# Patient Record
Sex: Female | Born: 1945
Health system: Southern US, Community
[De-identification: ages and names within clinical notes are randomized; demographics above are authoritative.]

## PROBLEM LIST (undated history)

## (undated) DIAGNOSIS — T41205A Adverse effect of unspecified general anesthetics, initial encounter: Secondary | ICD-10-CM

## (undated) DIAGNOSIS — Z923 Personal history of irradiation: Secondary | ICD-10-CM

## (undated) DIAGNOSIS — F319 Bipolar disorder, unspecified: Secondary | ICD-10-CM

## (undated) DIAGNOSIS — G245 Blepharospasm: Secondary | ICD-10-CM

## (undated) DIAGNOSIS — I509 Heart failure, unspecified: Secondary | ICD-10-CM

## (undated) DIAGNOSIS — Z9889 Other specified postprocedural states: Secondary | ICD-10-CM

## (undated) DIAGNOSIS — J439 Emphysema, unspecified: Secondary | ICD-10-CM

## (undated) DIAGNOSIS — M1990 Unspecified osteoarthritis, unspecified site: Secondary | ICD-10-CM

## (undated) DIAGNOSIS — M549 Dorsalgia, unspecified: Secondary | ICD-10-CM

## (undated) DIAGNOSIS — J449 Chronic obstructive pulmonary disease, unspecified: Secondary | ICD-10-CM

## (undated) DIAGNOSIS — I255 Ischemic cardiomyopathy: Secondary | ICD-10-CM

## (undated) DIAGNOSIS — L719 Rosacea, unspecified: Secondary | ICD-10-CM

## (undated) DIAGNOSIS — I10 Essential (primary) hypertension: Secondary | ICD-10-CM

## (undated) DIAGNOSIS — F419 Anxiety disorder, unspecified: Secondary | ICD-10-CM

## (undated) DIAGNOSIS — I2510 Atherosclerotic heart disease of native coronary artery without angina pectoris: Secondary | ICD-10-CM

## (undated) DIAGNOSIS — K219 Gastro-esophageal reflux disease without esophagitis: Secondary | ICD-10-CM

## (undated) DIAGNOSIS — F329 Major depressive disorder, single episode, unspecified: Secondary | ICD-10-CM

## (undated) DIAGNOSIS — G510 Bell's palsy: Secondary | ICD-10-CM

## (undated) DIAGNOSIS — Z955 Presence of coronary angioplasty implant and graft: Secondary | ICD-10-CM

## (undated) DIAGNOSIS — F1011 Alcohol abuse, in remission: Secondary | ICD-10-CM

## (undated) DIAGNOSIS — F32A Depression, unspecified: Secondary | ICD-10-CM

## (undated) DIAGNOSIS — G8929 Other chronic pain: Secondary | ICD-10-CM

## (undated) DIAGNOSIS — I739 Peripheral vascular disease, unspecified: Secondary | ICD-10-CM

## (undated) DIAGNOSIS — R2689 Other abnormalities of gait and mobility: Secondary | ICD-10-CM

## (undated) DIAGNOSIS — F1020 Alcohol dependence, uncomplicated: Secondary | ICD-10-CM

## (undated) DIAGNOSIS — R569 Unspecified convulsions: Secondary | ICD-10-CM

## (undated) DIAGNOSIS — C649 Malignant neoplasm of unspecified kidney, except renal pelvis: Secondary | ICD-10-CM

## (undated) DIAGNOSIS — C52 Malignant neoplasm of vagina: Secondary | ICD-10-CM

## (undated) DIAGNOSIS — N2889 Other specified disorders of kidney and ureter: Secondary | ICD-10-CM

## (undated) DIAGNOSIS — I2119 ST elevation (STEMI) myocardial infarction involving other coronary artery of inferior wall: Secondary | ICD-10-CM

## (undated) DIAGNOSIS — M419 Scoliosis, unspecified: Secondary | ICD-10-CM

## (undated) HISTORY — DX: Unspecified osteoarthritis, unspecified site: M19.90

## (undated) HISTORY — DX: Depression, unspecified: F32.A

## (undated) HISTORY — PX: CORONARY ANGIOPLASTY WITH STENT PLACEMENT: SHX49

## (undated) HISTORY — DX: Alcohol dependence, uncomplicated: F10.20

## (undated) HISTORY — DX: Chronic obstructive pulmonary disease, unspecified: J44.9

## (undated) HISTORY — DX: Gastro-esophageal reflux disease without esophagitis: K21.9

## (undated) HISTORY — DX: Anxiety disorder, unspecified: F41.9

## (undated) HISTORY — DX: Essential (primary) hypertension: I10

## (undated) HISTORY — PX: CATARACT EXTRACTION W/ INTRAOCULAR LENS  IMPLANT, BILATERAL: SHX1307

## (undated) HISTORY — DX: Major depressive disorder, single episode, unspecified: F32.9

## (undated) HISTORY — DX: Scoliosis, unspecified: M41.9

---

## 1993-01-17 HISTORY — PX: CAROTID ENDARTERECTOMY: SUR193

## 1997-06-03 ENCOUNTER — Inpatient Hospital Stay (HOSPITAL_COMMUNITY): Admission: RE | Admit: 1997-06-03 | Discharge: 1997-06-05 | Payer: Self-pay | Admitting: *Deleted

## 1998-01-17 HISTORY — PX: CORONARY ANGIOPLASTY WITH STENT PLACEMENT: SHX49

## 1998-03-10 ENCOUNTER — Ambulatory Visit (HOSPITAL_COMMUNITY): Admission: RE | Admit: 1998-03-10 | Discharge: 1998-03-10 | Payer: Self-pay | Admitting: Family Medicine

## 1998-03-26 ENCOUNTER — Other Ambulatory Visit: Admission: RE | Admit: 1998-03-26 | Discharge: 1998-03-26 | Payer: Self-pay | Admitting: Obstetrics & Gynecology

## 1998-08-10 ENCOUNTER — Encounter: Payer: Self-pay | Admitting: Family Medicine

## 1998-08-10 ENCOUNTER — Ambulatory Visit (HOSPITAL_COMMUNITY): Admission: RE | Admit: 1998-08-10 | Discharge: 1998-08-10 | Payer: Self-pay | Admitting: Family Medicine

## 1998-08-20 ENCOUNTER — Encounter: Payer: Self-pay | Admitting: *Deleted

## 1998-08-20 ENCOUNTER — Inpatient Hospital Stay (HOSPITAL_COMMUNITY): Admission: EM | Admit: 1998-08-20 | Discharge: 1998-08-23 | Payer: Self-pay | Admitting: Emergency Medicine

## 1998-09-08 ENCOUNTER — Encounter (HOSPITAL_COMMUNITY): Admission: RE | Admit: 1998-09-08 | Discharge: 1998-12-07 | Payer: Self-pay | Admitting: Cardiology

## 1999-03-29 ENCOUNTER — Other Ambulatory Visit: Admission: RE | Admit: 1999-03-29 | Discharge: 1999-03-29 | Payer: Self-pay | Admitting: Family Medicine

## 1999-09-15 ENCOUNTER — Encounter: Payer: Self-pay | Admitting: Family Medicine

## 1999-09-15 ENCOUNTER — Encounter: Admission: RE | Admit: 1999-09-15 | Discharge: 1999-09-15 | Payer: Self-pay | Admitting: Family Medicine

## 2000-05-04 ENCOUNTER — Other Ambulatory Visit: Admission: RE | Admit: 2000-05-04 | Discharge: 2000-05-04 | Payer: Self-pay | Admitting: Family Medicine

## 2001-05-17 ENCOUNTER — Other Ambulatory Visit: Admission: RE | Admit: 2001-05-17 | Discharge: 2001-05-17 | Payer: Self-pay | Admitting: Family Medicine

## 2001-08-07 ENCOUNTER — Encounter: Admission: RE | Admit: 2001-08-07 | Discharge: 2001-08-07 | Payer: Self-pay | Admitting: Family Medicine

## 2001-08-07 ENCOUNTER — Encounter: Payer: Self-pay | Admitting: Family Medicine

## 2001-12-11 ENCOUNTER — Encounter: Payer: Self-pay | Admitting: Family Medicine

## 2001-12-11 ENCOUNTER — Ambulatory Visit (HOSPITAL_COMMUNITY): Admission: RE | Admit: 2001-12-11 | Discharge: 2001-12-11 | Payer: Self-pay | Admitting: Internal Medicine

## 2002-05-21 ENCOUNTER — Other Ambulatory Visit: Admission: RE | Admit: 2002-05-21 | Discharge: 2002-05-21 | Payer: Self-pay | Admitting: Family Medicine

## 2002-10-16 ENCOUNTER — Encounter: Admission: RE | Admit: 2002-10-16 | Discharge: 2002-10-16 | Payer: Self-pay | Admitting: Family Medicine

## 2002-10-16 ENCOUNTER — Encounter: Payer: Self-pay | Admitting: Family Medicine

## 2002-10-17 ENCOUNTER — Encounter: Admission: RE | Admit: 2002-10-17 | Discharge: 2002-10-17 | Payer: Self-pay | Admitting: Family Medicine

## 2002-10-17 ENCOUNTER — Encounter: Payer: Self-pay | Admitting: Family Medicine

## 2003-07-08 ENCOUNTER — Other Ambulatory Visit: Admission: RE | Admit: 2003-07-08 | Discharge: 2003-07-08 | Payer: Self-pay | Admitting: Family Medicine

## 2004-06-03 ENCOUNTER — Encounter: Admission: RE | Admit: 2004-06-03 | Discharge: 2004-06-03 | Payer: Self-pay | Admitting: Family Medicine

## 2004-07-08 ENCOUNTER — Other Ambulatory Visit: Admission: RE | Admit: 2004-07-08 | Discharge: 2004-07-08 | Payer: Self-pay | Admitting: Family Medicine

## 2004-07-13 ENCOUNTER — Encounter: Admission: RE | Admit: 2004-07-13 | Discharge: 2004-07-13 | Payer: Self-pay | Admitting: Family Medicine

## 2004-09-19 ENCOUNTER — Emergency Department (HOSPITAL_COMMUNITY): Admission: EM | Admit: 2004-09-19 | Discharge: 2004-09-19 | Payer: Self-pay | Admitting: Emergency Medicine

## 2005-01-17 HISTORY — PX: OTHER SURGICAL HISTORY: SHX169

## 2005-09-28 ENCOUNTER — Ambulatory Visit (HOSPITAL_COMMUNITY): Admission: RE | Admit: 2005-09-28 | Discharge: 2005-09-28 | Payer: Self-pay | Admitting: Orthopedic Surgery

## 2005-09-30 ENCOUNTER — Ambulatory Visit (HOSPITAL_COMMUNITY): Admission: RE | Admit: 2005-09-30 | Discharge: 2005-09-30 | Payer: Self-pay | Admitting: Family Medicine

## 2005-10-03 ENCOUNTER — Encounter (HOSPITAL_COMMUNITY): Admission: RE | Admit: 2005-10-03 | Discharge: 2006-01-01 | Payer: Self-pay | Admitting: Family Medicine

## 2005-10-10 HISTORY — PX: OTHER SURGICAL HISTORY: SHX169

## 2005-10-11 ENCOUNTER — Inpatient Hospital Stay (HOSPITAL_COMMUNITY): Admission: AD | Admit: 2005-10-11 | Discharge: 2005-10-12 | Payer: Self-pay | Admitting: Orthopedic Surgery

## 2006-02-24 ENCOUNTER — Encounter: Admission: RE | Admit: 2006-02-24 | Discharge: 2006-02-24 | Payer: Self-pay | Admitting: Family Medicine

## 2006-05-15 ENCOUNTER — Inpatient Hospital Stay (HOSPITAL_COMMUNITY): Admission: EM | Admit: 2006-05-15 | Discharge: 2006-05-17 | Payer: Self-pay | Admitting: Emergency Medicine

## 2006-08-01 ENCOUNTER — Other Ambulatory Visit: Admission: RE | Admit: 2006-08-01 | Discharge: 2006-08-01 | Payer: Self-pay | Admitting: Family Medicine

## 2007-01-30 ENCOUNTER — Other Ambulatory Visit: Admission: RE | Admit: 2007-01-30 | Discharge: 2007-01-30 | Payer: Self-pay | Admitting: Family Medicine

## 2007-02-07 ENCOUNTER — Inpatient Hospital Stay (HOSPITAL_COMMUNITY): Admission: EM | Admit: 2007-02-07 | Discharge: 2007-02-13 | Payer: Self-pay | Admitting: Emergency Medicine

## 2007-02-09 ENCOUNTER — Ambulatory Visit: Payer: Self-pay | Admitting: Physical Medicine & Rehabilitation

## 2007-02-13 HISTORY — PX: ORIF HIP FRACTURE: SHX2125

## 2008-03-27 ENCOUNTER — Ambulatory Visit: Payer: Self-pay | Admitting: Vascular Surgery

## 2008-03-27 ENCOUNTER — Encounter (INDEPENDENT_AMBULATORY_CARE_PROVIDER_SITE_OTHER): Payer: Self-pay | Admitting: Orthopedic Surgery

## 2008-03-27 ENCOUNTER — Ambulatory Visit (HOSPITAL_COMMUNITY): Admission: RE | Admit: 2008-03-27 | Discharge: 2008-03-27 | Payer: Self-pay | Admitting: Orthopedic Surgery

## 2008-04-15 ENCOUNTER — Inpatient Hospital Stay (HOSPITAL_COMMUNITY): Admission: RE | Admit: 2008-04-15 | Discharge: 2008-04-18 | Payer: Self-pay | Admitting: Orthopedic Surgery

## 2008-04-15 HISTORY — PX: TOTAL HIP ARTHROPLASTY: SHX124

## 2008-07-31 ENCOUNTER — Other Ambulatory Visit: Admission: RE | Admit: 2008-07-31 | Discharge: 2008-07-31 | Payer: Self-pay | Admitting: Family Medicine

## 2008-08-06 ENCOUNTER — Encounter: Admission: RE | Admit: 2008-08-06 | Discharge: 2008-08-06 | Payer: Self-pay | Admitting: Family Medicine

## 2008-08-18 ENCOUNTER — Ambulatory Visit: Payer: Self-pay | Admitting: Gastroenterology

## 2008-09-03 ENCOUNTER — Encounter: Payer: Self-pay | Admitting: Gastroenterology

## 2008-09-03 ENCOUNTER — Ambulatory Visit: Payer: Self-pay | Admitting: Gastroenterology

## 2008-09-05 ENCOUNTER — Encounter: Payer: Self-pay | Admitting: Gastroenterology

## 2008-09-23 ENCOUNTER — Encounter (INDEPENDENT_AMBULATORY_CARE_PROVIDER_SITE_OTHER): Payer: Self-pay | Admitting: Obstetrics & Gynecology

## 2008-09-23 ENCOUNTER — Ambulatory Visit (HOSPITAL_COMMUNITY): Admission: RE | Admit: 2008-09-23 | Discharge: 2008-09-23 | Payer: Self-pay | Admitting: Obstetrics & Gynecology

## 2008-09-23 HISTORY — PX: CERVICAL CONIZATION W/BX: SHX1330

## 2008-10-06 ENCOUNTER — Encounter: Admission: RE | Admit: 2008-10-06 | Discharge: 2008-10-06 | Payer: Self-pay | Admitting: Family Medicine

## 2008-10-20 ENCOUNTER — Encounter: Admission: RE | Admit: 2008-10-20 | Discharge: 2008-10-20 | Payer: Self-pay | Admitting: Orthopedic Surgery

## 2008-12-11 ENCOUNTER — Encounter (INDEPENDENT_AMBULATORY_CARE_PROVIDER_SITE_OTHER): Payer: Self-pay | Admitting: Emergency Medicine

## 2008-12-11 ENCOUNTER — Emergency Department (HOSPITAL_COMMUNITY): Admission: EM | Admit: 2008-12-11 | Discharge: 2008-12-11 | Payer: Self-pay | Admitting: Emergency Medicine

## 2008-12-11 ENCOUNTER — Ambulatory Visit: Payer: Self-pay | Admitting: Surgery

## 2008-12-25 ENCOUNTER — Inpatient Hospital Stay (HOSPITAL_COMMUNITY): Admission: EM | Admit: 2008-12-25 | Discharge: 2009-01-01 | Payer: Self-pay | Admitting: Emergency Medicine

## 2008-12-26 HISTORY — PX: HEMIARTHROPLASTY HIP: SUR652

## 2009-01-28 ENCOUNTER — Inpatient Hospital Stay (HOSPITAL_COMMUNITY): Admission: EM | Admit: 2009-01-28 | Discharge: 2009-02-03 | Payer: Self-pay | Admitting: Emergency Medicine

## 2009-02-08 ENCOUNTER — Inpatient Hospital Stay (HOSPITAL_COMMUNITY): Admission: EM | Admit: 2009-02-08 | Discharge: 2009-02-16 | Payer: Self-pay | Admitting: Emergency Medicine

## 2009-07-06 ENCOUNTER — Encounter (INDEPENDENT_AMBULATORY_CARE_PROVIDER_SITE_OTHER): Payer: Self-pay | Admitting: Obstetrics & Gynecology

## 2009-07-06 ENCOUNTER — Inpatient Hospital Stay (HOSPITAL_COMMUNITY): Admission: RE | Admit: 2009-07-06 | Discharge: 2009-07-07 | Payer: Self-pay | Admitting: Obstetrics & Gynecology

## 2009-07-06 HISTORY — PX: VAGINAL HYSTERECTOMY: SUR661

## 2009-09-07 ENCOUNTER — Ambulatory Visit: Payer: Self-pay | Admitting: Cardiology

## 2009-09-22 ENCOUNTER — Inpatient Hospital Stay (HOSPITAL_COMMUNITY): Admission: RE | Admit: 2009-09-22 | Discharge: 2009-09-25 | Payer: Self-pay | Admitting: Orthopedic Surgery

## 2009-09-22 HISTORY — PX: TOTAL KNEE ARTHROPLASTY: SHX125

## 2010-02-09 ENCOUNTER — Other Ambulatory Visit: Payer: Self-pay | Admitting: Family Medicine

## 2010-02-09 DIAGNOSIS — Z1239 Encounter for other screening for malignant neoplasm of breast: Secondary | ICD-10-CM

## 2010-02-11 DIAGNOSIS — M159 Polyosteoarthritis, unspecified: Secondary | ICD-10-CM | POA: Insufficient documentation

## 2010-02-18 ENCOUNTER — Ambulatory Visit
Admission: RE | Admit: 2010-02-18 | Discharge: 2010-02-18 | Disposition: A | Discharge status: Home / Self Care | Payer: Medicare Other | Source: Ambulatory Visit | Attending: Family Medicine | Admitting: Family Medicine

## 2010-02-18 DIAGNOSIS — Z1239 Encounter for other screening for malignant neoplasm of breast: Secondary | ICD-10-CM

## 2010-04-01 LAB — CBC
MCH: 31.5 pg (ref 26.0–34.0)
MCV: 92.7 fL (ref 78.0–100.0)
Platelets: 199 10*3/uL (ref 150–400)
Platelets: 214 10*3/uL (ref 150–400)
RBC: 3.55 MIL/uL — ABNORMAL LOW (ref 3.87–5.11)
RDW: 13.7 % (ref 11.5–15.5)
WBC: 6.2 10*3/uL (ref 4.0–10.5)

## 2010-04-01 LAB — BASIC METABOLIC PANEL
BUN: 5 mg/dL — ABNORMAL LOW (ref 6–23)
CO2: 28 mEq/L (ref 19–32)
Calcium: 9.2 mg/dL (ref 8.4–10.5)
Calcium: 9.6 mg/dL (ref 8.4–10.5)
Chloride: 97 mEq/L (ref 96–112)
Creatinine, Ser: 0.53 mg/dL (ref 0.4–1.2)
GFR calc non Af Amer: >60 mL/min (ref >60)
Sodium: 137 mEq/L (ref 135–145)

## 2010-04-01 LAB — APTT

## 2010-04-01 LAB — PROTIME-INR: Prothrombin Time: 13.1 seconds (ref 11.6–15.2)

## 2010-04-01 LAB — TYPE AND SCREEN

## 2010-04-02 LAB — BASIC METABOLIC PANEL
BUN: 12 mg/dL (ref 6–23)
Calcium: 9.5 mg/dL (ref 8.4–10.5)
Creatinine, Ser: 0.76 mg/dL (ref 0.4–1.2)
GFR calc non Af Amer: >60 mL/min (ref >60)

## 2010-04-02 LAB — URINALYSIS, ROUTINE W REFLEX MICROSCOPIC
Glucose, UA: NEGATIVE mg/dL
Ketones, ur: NEGATIVE mg/dL
Protein, ur: NEGATIVE mg/dL

## 2010-04-02 LAB — DIFFERENTIAL
Basophils Absolute: 0.1 10*3/uL (ref 0.0–0.1)
Eosinophils Relative: 2 % (ref 0–5)
Lymphocytes Relative: 26 % (ref 12–46)
Neutro Abs: 4.6 10*3/uL (ref 1.7–7.7)
Neutrophils Relative %: 64 % (ref 43–77)

## 2010-04-02 LAB — PROTIME-INR: Prothrombin Time: 12.4 seconds (ref 11.6–15.2)

## 2010-04-02 LAB — APTT: aPTT: 43 seconds — ABNORMAL HIGH (ref 24–37)

## 2010-04-02 LAB — CBC
Platelets: 299 10*3/uL (ref 150–400)
RDW: 16 % — ABNORMAL HIGH (ref 11.5–15.5)
WBC: 7.2 10*3/uL (ref 4.0–10.5)

## 2010-04-02 LAB — SURGICAL PCR SCREEN: Staphylococcus aureus: NEGATIVE

## 2010-04-04 LAB — BASIC METABOLIC PANEL
BUN: 2 mg/dL — ABNORMAL LOW (ref 6–23)
BUN: 2 mg/dL — ABNORMAL LOW (ref 6–23)
BUN: 1 mg/dL — ABNORMAL LOW (ref 6–23)
BUN: 1 mg/dL — ABNORMAL LOW (ref 6–23)
BUN: 4 mg/dL — ABNORMAL LOW (ref 6–23)
BUN: 4 mg/dL — ABNORMAL LOW (ref 6–23)
BUN: 5 mg/dL — ABNORMAL LOW (ref 6–23)
BUN: 8 mg/dL (ref 6–23)
CO2: 24 mEq/L (ref 19–32)
CO2: 26 mEq/L (ref 19–32)
CO2: 26 mEq/L (ref 19–32)
CO2: 26 mEq/L (ref 19–32)
CO2: 26 mEq/L (ref 19–32)
CO2: 27 mEq/L (ref 19–32)
CO2: 28 mEq/L (ref 19–32)
Calcium: 7.5 mg/dL — ABNORMAL LOW (ref 8.4–10.5)
Calcium: 7.7 mg/dL — ABNORMAL LOW (ref 8.4–10.5)
Calcium: 7.9 mg/dL — ABNORMAL LOW (ref 8.4–10.5)
Calcium: 8 mg/dL — ABNORMAL LOW (ref 8.4–10.5)
Calcium: 8.1 mg/dL — ABNORMAL LOW (ref 8.4–10.5)
Calcium: 8.1 mg/dL — ABNORMAL LOW (ref 8.4–10.5)
Calcium: 8.2 mg/dL — ABNORMAL LOW (ref 8.4–10.5)
Chloride: 105 mEq/L (ref 96–112)
Chloride: 106 mEq/L (ref 96–112)
Chloride: 104 mEq/L (ref 96–112)
Chloride: 107 mEq/L (ref 96–112)
Chloride: 107 mEq/L (ref 96–112)
Chloride: 107 mEq/L (ref 96–112)
Chloride: 110 mEq/L (ref 96–112)
Creatinine, Ser: 0.44 mg/dL (ref 0.4–1.2)
Creatinine, Ser: 0.45 mg/dL (ref 0.4–1.2)
Creatinine, Ser: 0.45 mg/dL (ref 0.4–1.2)
Creatinine, Ser: 0.69 mg/dL (ref 0.4–1.2)
Creatinine, Ser: 0.73 mg/dL (ref 0.4–1.2)
GFR calc Af Amer: >60 mL/min (ref >60)
GFR calc Af Amer: >60 mL/min (ref >60)
GFR calc Af Amer: >60 mL/min (ref >60)
GFR calc Af Amer: >60 mL/min (ref >60)
GFR calc Af Amer: >60 mL/min (ref >60)
GFR calc Af Amer: >60 mL/min (ref >60)
GFR calc non Af Amer: >60 mL/min (ref >60)
GFR calc non Af Amer: 52 mL/min — ABNORMAL LOW (ref >60)
GFR calc non Af Amer: 54 mL/min — ABNORMAL LOW (ref >60)
GFR calc non Af Amer: >60 mL/min (ref >60)
GFR calc non Af Amer: >60 mL/min (ref >60)
GFR calc non Af Amer: >60 mL/min (ref >60)
GFR calc non Af Amer: >60 mL/min (ref >60)
GFR calc non Af Amer: >60 mL/min (ref >60)
Glucose, Bld: 107 mg/dL — ABNORMAL HIGH (ref 70–99)
Glucose, Bld: 116 mg/dL — ABNORMAL HIGH (ref 70–99)
Glucose, Bld: 101 mg/dL — ABNORMAL HIGH (ref 70–99)
Glucose, Bld: 102 mg/dL — ABNORMAL HIGH (ref 70–99)
Glucose, Bld: 123 mg/dL — ABNORMAL HIGH (ref 70–99)
Glucose, Bld: 128 mg/dL — ABNORMAL HIGH (ref 70–99)
Glucose, Bld: 128 mg/dL — ABNORMAL HIGH (ref 70–99)
Glucose, Bld: 56 mg/dL — ABNORMAL LOW (ref 70–99)
Glucose, Bld: 79 mg/dL (ref 70–99)
Potassium: 3.3 mEq/L — ABNORMAL LOW (ref 3.5–5.1)
Potassium: 2.5 mEq/L — CL (ref 3.5–5.1)
Potassium: 3.1 mEq/L — ABNORMAL LOW (ref 3.5–5.1)
Potassium: 3.4 mEq/L — ABNORMAL LOW (ref 3.5–5.1)
Potassium: 3.5 mEq/L (ref 3.5–5.1)
Potassium: 3.5 mEq/L (ref 3.5–5.1)
Potassium: 3.9 mEq/L (ref 3.5–5.1)
Sodium: 138 mEq/L (ref 135–145)
Sodium: 139 mEq/L (ref 135–145)
Sodium: 139 mEq/L (ref 135–145)
Sodium: 140 mEq/L (ref 135–145)
Sodium: 141 mEq/L (ref 135–145)
Sodium: 141 mEq/L (ref 135–145)
Sodium: 145 mEq/L (ref 135–145)

## 2010-04-04 LAB — CBC
HCT: 29.6 % — ABNORMAL LOW (ref 36.0–46.0)
HCT: 27.4 % — ABNORMAL LOW (ref 36.0–46.0)
HCT: 30 % — ABNORMAL LOW (ref 36.0–46.0)
HCT: 30.2 % — ABNORMAL LOW (ref 36.0–46.0)
HCT: 31.3 % — ABNORMAL LOW (ref 36.0–46.0)
HCT: 32.1 % — ABNORMAL LOW (ref 36.0–46.0)
HCT: 32.4 % — ABNORMAL LOW (ref 36.0–46.0)
HCT: 37.3 % (ref 36.0–46.0)
Hemoglobin: 11.4 g/dL — ABNORMAL LOW (ref 12.0–15.0)
Hemoglobin: 10.3 g/dL — ABNORMAL LOW (ref 12.0–15.0)
Hemoglobin: 10.4 g/dL — ABNORMAL LOW (ref 12.0–15.0)
Hemoglobin: 9.2 g/dL — ABNORMAL LOW (ref 12.0–15.0)
Hemoglobin: 9.7 g/dL — ABNORMAL LOW (ref 12.0–15.0)
Hemoglobin: 9.8 g/dL — ABNORMAL LOW (ref 12.0–15.0)
Hemoglobin: 12.3 g/dL (ref 12.0–15.0)
MCHC: 33.3 g/dL (ref 30.0–36.0)
MCHC: 32.4 g/dL (ref 30.0–36.0)
MCHC: 32.4 g/dL (ref 30.0–36.0)
MCHC: 33.5 g/dL (ref 30.0–36.0)
MCV: 90.9 fL (ref 78.0–100.0)
MCV: 87.8 fL (ref 78.0–100.0)
MCV: 89.4 fL (ref 78.0–100.0)
MCV: 89.6 fL (ref 78.0–100.0)
MCV: 90.1 fL (ref 78.0–100.0)
MCV: 90.6 fL (ref 78.0–100.0)
Platelets: 350 10*3/uL (ref 150–400)
Platelets: 405 10*3/uL — ABNORMAL HIGH (ref 150–400)
Platelets: 433 10*3/uL — ABNORMAL HIGH (ref 150–400)
Platelets: 456 10*3/uL — ABNORMAL HIGH (ref 150–400)
Platelets: 464 10*3/uL — ABNORMAL HIGH (ref 150–400)
Platelets: 200 10*3/uL (ref 150–400)
Platelets: 253 10*3/uL (ref 150–400)
RBC: 3.26 MIL/uL — ABNORMAL LOW (ref 3.87–5.11)
RBC: 3.77 MIL/uL — ABNORMAL LOW (ref 3.87–5.11)
RBC: 3.09 MIL/uL — ABNORMAL LOW (ref 3.87–5.11)
RBC: 3.27 MIL/uL — ABNORMAL LOW (ref 3.87–5.11)
RDW: 17.3 % — ABNORMAL HIGH (ref 11.5–15.5)
RDW: 17.5 % — ABNORMAL HIGH (ref 11.5–15.5)
RDW: 17.5 % — ABNORMAL HIGH (ref 11.5–15.5)
RDW: 17.6 % — ABNORMAL HIGH (ref 11.5–15.5)
RDW: 17.7 % — ABNORMAL HIGH (ref 11.5–15.5)
RDW: 17.8 % — ABNORMAL HIGH (ref 11.5–15.5)
RDW: 18 % — ABNORMAL HIGH (ref 11.5–15.5)
RDW: 19.3 % — ABNORMAL HIGH (ref 11.5–15.5)
WBC: 13.1 10*3/uL — ABNORMAL HIGH (ref 4.0–10.5)
WBC: 19 10*3/uL — ABNORMAL HIGH (ref 4.0–10.5)
WBC: 6.9 10*3/uL (ref 4.0–10.5)
WBC: 8.8 10*3/uL (ref 4.0–10.5)
WBC: 8.8 10*3/uL (ref 4.0–10.5)
WBC: 8.5 10*3/uL (ref 4.0–10.5)

## 2010-04-04 LAB — DIFFERENTIAL
Basophils Absolute: 0 10*3/uL (ref 0.0–0.1)
Basophils Absolute: 0 10*3/uL (ref 0.0–0.1)
Basophils Relative: 0 % (ref 0–1)
Eosinophils Absolute: 0 10*3/uL (ref 0.0–0.7)
Eosinophils Relative: 0 % (ref 0–5)
Eosinophils Relative: 0 % (ref 0–5)
Eosinophils Relative: 0 % (ref 0–5)
Lymphocytes Relative: 10 % — ABNORMAL LOW (ref 12–46)
Lymphs Abs: 0.4 10*3/uL — ABNORMAL LOW (ref 0.7–4.0)
Lymphs Abs: 0.9 10*3/uL (ref 0.7–4.0)
Monocytes Absolute: 0.4 10*3/uL (ref 0.1–1.0)
Monocytes Absolute: 0.4 10*3/uL (ref 0.1–1.0)
Monocytes Absolute: 0.5 10*3/uL (ref 0.1–1.0)
Neutro Abs: 18.2 10*3/uL — ABNORMAL HIGH (ref 1.7–7.7)

## 2010-04-04 LAB — COMPREHENSIVE METABOLIC PANEL
ALT: 14 U/L (ref 0–35)
ALT: 17 U/L (ref 0–35)
AST: 18 U/L (ref 0–37)
AST: 18 U/L (ref 0–37)
Albumin: 2.4 g/dL — ABNORMAL LOW (ref 3.5–5.2)
Alkaline Phosphatase: 118 U/L — ABNORMAL HIGH (ref 39–117)
Alkaline Phosphatase: 83 U/L (ref 39–117)
CO2: 25 mEq/L (ref 19–32)
Chloride: 104 mEq/L (ref 96–112)
GFR calc Af Amer: 51 mL/min — ABNORMAL LOW (ref >60)
GFR calc non Af Amer: >60 mL/min (ref >60)
Glucose, Bld: 92 mg/dL (ref 70–99)
Potassium: 3.8 mEq/L (ref 3.5–5.1)
Potassium: 2.8 mEq/L — ABNORMAL LOW (ref 3.5–5.1)
Sodium: 133 mEq/L — ABNORMAL LOW (ref 135–145)
Sodium: 141 mEq/L (ref 135–145)
Total Bilirubin: 0.3 mg/dL (ref 0.3–1.2)

## 2010-04-04 LAB — URINALYSIS, ROUTINE W REFLEX MICROSCOPIC
Bilirubin Urine: NEGATIVE
Bilirubin Urine: NEGATIVE
Glucose, UA: NEGATIVE mg/dL
Ketones, ur: NEGATIVE mg/dL
Nitrite: POSITIVE — AB
Nitrite: NEGATIVE
Specific Gravity, Urine: 1.017 (ref 1.005–1.030)
Specific Gravity, Urine: 1.011 (ref 1.005–1.030)
Specific Gravity, Urine: 1.023 (ref 1.005–1.030)
Urobilinogen, UA: 0.2 mg/dL (ref 0.0–1.0)
Urobilinogen, UA: 0.2 mg/dL (ref 0.0–1.0)
pH: 6.5 (ref 5.0–8.0)

## 2010-04-04 LAB — GLUCOSE, CAPILLARY: Glucose-Capillary: 147 mg/dL — ABNORMAL HIGH (ref 70–99)

## 2010-04-04 LAB — URINE MICROSCOPIC-ADD ON

## 2010-04-04 LAB — CARDIAC PANEL(CRET KIN+CKTOT+MB+TROPI)
CK, MB: 2.7 ng/mL (ref 0.3–4.0)
Troponin I: 0.03 ng/mL (ref 0.00–0.06)

## 2010-04-04 LAB — URINALYSIS, MICROSCOPIC ONLY
Bilirubin Urine: NEGATIVE
Leukocytes, UA: NEGATIVE
Nitrite: NEGATIVE
Specific Gravity, Urine: 1.01 (ref 1.005–1.030)
Urobilinogen, UA: 0.2 mg/dL (ref 0.0–1.0)

## 2010-04-04 LAB — FECAL LACTOFERRIN, QUANT: Fecal Lactoferrin: NEGATIVE

## 2010-04-04 LAB — CLOSTRIDIUM DIFFICILE EIA

## 2010-04-04 LAB — CK TOTAL AND CKMB (NOT AT ARMC)
CK, MB: 3.5 ng/mL (ref 0.3–4.0)
Relative Index: 1.6 (ref 0.0–2.5)
Total CK: 213 U/L — ABNORMAL HIGH (ref 7–177)

## 2010-04-04 LAB — TROPONIN I: Troponin I: 0.02 ng/mL (ref 0.00–0.06)

## 2010-04-04 LAB — URINALYSIS, DIPSTICK ONLY
Glucose, UA: NEGATIVE mg/dL
Leukocytes, UA: NEGATIVE
Protein, ur: NEGATIVE mg/dL
pH: 7 (ref 5.0–8.0)

## 2010-04-04 LAB — URINE CULTURE: Culture: NO GROWTH

## 2010-04-04 LAB — POTASSIUM: Potassium: 3.6 mEq/L (ref 3.5–5.1)

## 2010-04-12 DIAGNOSIS — I2510 Atherosclerotic heart disease of native coronary artery without angina pectoris: Secondary | ICD-10-CM | POA: Insufficient documentation

## 2010-04-19 LAB — BASIC METABOLIC PANEL
BUN: 6 mg/dL (ref 6–23)
Chloride: 100 mEq/L (ref 96–112)
Potassium: 3.9 mEq/L (ref 3.5–5.1)
Sodium: 137 mEq/L (ref 135–145)

## 2010-04-19 LAB — CBC
HCT: 29.8 % — ABNORMAL LOW (ref 36.0–46.0)
Hemoglobin: 9.8 g/dL — ABNORMAL LOW (ref 12.0–15.0)
MCV: 92.5 fL (ref 78.0–100.0)
Platelets: 272 10*3/uL (ref 150–400)
RBC: 3.22 MIL/uL — ABNORMAL LOW (ref 3.87–5.11)
WBC: 4.7 10*3/uL (ref 4.0–10.5)

## 2010-04-20 LAB — DIFFERENTIAL
Basophils Absolute: 0 10*3/uL (ref 0.0–0.1)
Basophils Absolute: 0 10*3/uL (ref 0.0–0.1)
Basophils Relative: 0 % (ref 0–1)
Basophils Relative: 1 % (ref 0–1)
Eosinophils Absolute: 0 10*3/uL (ref 0.0–0.7)
Eosinophils Absolute: 0.1 10*3/uL (ref 0.0–0.7)
Eosinophils Absolute: 0.1 10*3/uL (ref 0.0–0.7)
Eosinophils Relative: 1 % (ref 0–5)
Eosinophils Relative: 2 % (ref 0–5)
Eosinophils Relative: 2 % (ref 0–5)
Lymphocytes Relative: 4 % — ABNORMAL LOW (ref 12–46)
Lymphocytes Relative: 8 % — ABNORMAL LOW (ref 12–46)
Lymphs Abs: 0.5 10*3/uL — ABNORMAL LOW (ref 0.7–4.0)
Lymphs Abs: 0.7 10*3/uL (ref 0.7–4.0)
Monocytes Absolute: 0.4 10*3/uL (ref 0.1–1.0)
Monocytes Relative: 6 % (ref 3–12)
Monocytes Relative: 6 % (ref 3–12)
Neutro Abs: 7.9 10*3/uL — ABNORMAL HIGH (ref 1.7–7.7)
Neutrophils Relative %: 83 % — ABNORMAL HIGH (ref 43–77)
Neutrophils Relative %: 86 % — ABNORMAL HIGH (ref 43–77)

## 2010-04-20 LAB — IRON AND TIBC
Iron: 19 ug/dL — ABNORMAL LOW (ref 42–135)
Saturation Ratios: 8 % — ABNORMAL LOW (ref 20–55)
UIBC: 230 ug/dL

## 2010-04-20 LAB — CBC
HCT: 31.2 % — ABNORMAL LOW (ref 36.0–46.0)
HCT: 31.6 % — ABNORMAL LOW (ref 36.0–46.0)
HCT: 32 % — ABNORMAL LOW (ref 36.0–46.0)
HCT: 36.4 % (ref 36.0–46.0)
HCT: 36.8 % (ref 36.0–46.0)
Hemoglobin: 10.2 g/dL — ABNORMAL LOW (ref 12.0–15.0)
Hemoglobin: 10.6 g/dL — ABNORMAL LOW (ref 12.0–15.0)
Hemoglobin: 12.1 g/dL (ref 12.0–15.0)
Hemoglobin: 12.2 g/dL (ref 12.0–15.0)
Hemoglobin: 8.2 g/dL — ABNORMAL LOW (ref 12.0–15.0)
Hemoglobin: 9.6 g/dL — ABNORMAL LOW (ref 12.0–15.0)
MCHC: 33.2 g/dL (ref 30.0–36.0)
MCHC: 33.3 g/dL (ref 30.0–36.0)
MCHC: 33.8 g/dL (ref 30.0–36.0)
MCHC: 34 g/dL (ref 30.0–36.0)
MCV: 95.9 fL (ref 78.0–100.0)
MCV: 96.6 fL (ref 78.0–100.0)
MCV: 96.8 fL (ref 78.0–100.0)
Platelets: 171 10*3/uL (ref 150–400)
Platelets: 191 10*3/uL (ref 150–400)
Platelets: 202 10*3/uL (ref 150–400)
RBC: 2.49 MIL/uL — ABNORMAL LOW (ref 3.87–5.11)
RBC: 2.95 MIL/uL — ABNORMAL LOW (ref 3.87–5.11)
RBC: 3.31 MIL/uL — ABNORMAL LOW (ref 3.87–5.11)
RBC: 3.77 MIL/uL — ABNORMAL LOW (ref 3.87–5.11)
RBC: 3.8 MIL/uL — ABNORMAL LOW (ref 3.87–5.11)
RDW: 16.4 % — ABNORMAL HIGH (ref 11.5–15.5)
RDW: 18 % — ABNORMAL HIGH (ref 11.5–15.5)
WBC: 4.8 10*3/uL (ref 4.0–10.5)
WBC: 5.1 10*3/uL (ref 4.0–10.5)
WBC: 5.3 10*3/uL (ref 4.0–10.5)
WBC: 5.6 10*3/uL (ref 4.0–10.5)

## 2010-04-20 LAB — URINALYSIS, ROUTINE W REFLEX MICROSCOPIC
Glucose, UA: NEGATIVE mg/dL
Ketones, ur: NEGATIVE mg/dL
Specific Gravity, Urine: 1.014 (ref 1.005–1.030)
pH: 5.5 (ref 5.0–8.0)

## 2010-04-20 LAB — HEMOCCULT GUIAC POC 1CARD (OFFICE): Fecal Occult Bld: NEGATIVE

## 2010-04-20 LAB — COMPREHENSIVE METABOLIC PANEL
ALT: 19 U/L (ref 0–35)
AST: 27 U/L (ref 0–37)
Alkaline Phosphatase: 53 U/L (ref 39–117)
BUN: 19 mg/dL (ref 6–23)
CO2: 30 mEq/L (ref 19–32)
CO2: 31 mEq/L (ref 19–32)
Calcium: 8.3 mg/dL — ABNORMAL LOW (ref 8.4–10.5)
Calcium: 9.2 mg/dL (ref 8.4–10.5)
Chloride: 101 mEq/L (ref 96–112)
Chloride: 98 mEq/L (ref 96–112)
Creatinine, Ser: 0.88 mg/dL (ref 0.4–1.2)
GFR calc Af Amer: >60 mL/min (ref >60)
GFR calc non Af Amer: >60 mL/min (ref >60)
GFR calc non Af Amer: >60 mL/min (ref >60)
Glucose, Bld: 112 mg/dL — ABNORMAL HIGH (ref 70–99)
Glucose, Bld: 99 mg/dL (ref 70–99)
Sodium: 138 mEq/L (ref 135–145)
Total Bilirubin: 0.5 mg/dL (ref 0.3–1.2)
Total Bilirubin: 0.7 mg/dL (ref 0.3–1.2)

## 2010-04-20 LAB — BRAIN NATRIURETIC PEPTIDE: Pro B Natriuretic peptide (BNP): 140 pg/mL — ABNORMAL HIGH (ref 0.0–100.0)

## 2010-04-20 LAB — TYPE AND SCREEN
ABO/RH(D): B POS
Antibody Screen: NEGATIVE

## 2010-04-20 LAB — CROSSMATCH
ABO/RH(D): B POS
Antibody Screen: NEGATIVE

## 2010-04-20 LAB — BASIC METABOLIC PANEL
BUN: 12 mg/dL (ref 6–23)
BUN: 5 mg/dL — ABNORMAL LOW (ref 6–23)
BUN: 6 mg/dL (ref 6–23)
CO2: 27 mEq/L (ref 19–32)
CO2: 28 mEq/L (ref 19–32)
Calcium: 8.1 mg/dL — ABNORMAL LOW (ref 8.4–10.5)
Calcium: 8.6 mg/dL (ref 8.4–10.5)
Calcium: 8.7 mg/dL (ref 8.4–10.5)
Calcium: 8.7 mg/dL (ref 8.4–10.5)
Chloride: 104 mEq/L (ref 96–112)
Chloride: 97 mEq/L (ref 96–112)
Creatinine, Ser: 0.61 mg/dL (ref 0.4–1.2)
GFR calc Af Amer: >60 mL/min (ref >60)
GFR calc Af Amer: >60 mL/min (ref >60)
GFR calc Af Amer: >60 mL/min (ref >60)
GFR calc non Af Amer: >60 mL/min (ref >60)
GFR calc non Af Amer: >60 mL/min (ref >60)
GFR calc non Af Amer: >60 mL/min (ref >60)
Glucose, Bld: 104 mg/dL — ABNORMAL HIGH (ref 70–99)
Glucose, Bld: 117 mg/dL — ABNORMAL HIGH (ref 70–99)
Potassium: 3.4 mEq/L — ABNORMAL LOW (ref 3.5–5.1)
Potassium: 3.5 mEq/L (ref 3.5–5.1)
Potassium: 3.7 mEq/L (ref 3.5–5.1)
Sodium: 131 mEq/L — ABNORMAL LOW (ref 135–145)
Sodium: 135 mEq/L (ref 135–145)
Sodium: 137 mEq/L (ref 135–145)
Sodium: 138 mEq/L (ref 135–145)

## 2010-04-20 LAB — PROTIME-INR
INR: 1.39 (ref 0.00–1.49)
INR: 1.5 — ABNORMAL HIGH (ref 0.00–1.49)
INR: 1.5 — ABNORMAL HIGH (ref 0.00–1.49)
INR: 1.53 — ABNORMAL HIGH (ref 0.00–1.49)
Prothrombin Time: 14 seconds (ref 11.6–15.2)
Prothrombin Time: 18 seconds — ABNORMAL HIGH (ref 11.6–15.2)
Prothrombin Time: 18 seconds — ABNORMAL HIGH (ref 11.6–15.2)
Prothrombin Time: 18.3 seconds — ABNORMAL HIGH (ref 11.6–15.2)

## 2010-04-20 LAB — BLOOD GAS, ARTERIAL
Acid-Base Excess: 4.7 mmol/L — ABNORMAL HIGH (ref 0.0–2.0)
Drawn by: 277341
O2 Content: 3 L/min
Patient temperature: 98.6
pCO2 arterial: 35.7 mmHg (ref 35.0–45.0)
pH, Arterial: 7.501 — ABNORMAL HIGH (ref 7.350–7.400)

## 2010-04-20 LAB — LIPID PANEL
Cholesterol: 161 mg/dL (ref 0–200)
LDL Cholesterol: 51 mg/dL (ref 0–99)
VLDL: 8 mg/dL (ref 0–40)

## 2010-04-20 LAB — FERRITIN: Ferritin: 87 ng/mL (ref 10–291)

## 2010-04-20 LAB — URINE MICROSCOPIC-ADD ON

## 2010-04-20 LAB — TSH: TSH: 0.856 u[IU]/mL (ref 0.350–4.500)

## 2010-04-20 LAB — VITAMIN B12: Vitamin B-12: 684 pg/mL (ref 211–911)

## 2010-04-20 LAB — MAGNESIUM: Magnesium: 2 mg/dL (ref 1.5–2.5)

## 2010-04-20 LAB — URINE CULTURE: Special Requests: NEGATIVE

## 2010-04-21 LAB — D-DIMER, QUANTITATIVE: D-Dimer, Quant: 0.59 ug/mL-FEU — ABNORMAL HIGH (ref 0.00–0.48)

## 2010-04-21 LAB — CBC
HCT: 38 % (ref 36.0–46.0)
Hemoglobin: 12.8 g/dL (ref 12.0–15.0)
MCHC: 33.8 g/dL (ref 30.0–36.0)
MCV: 95.9 fL (ref 78.0–100.0)
Platelets: 201 10*3/uL (ref 150–400)
RBC: 3.96 MIL/uL (ref 3.87–5.11)
RDW: 17.6 % — ABNORMAL HIGH (ref 11.5–15.5)
WBC: 4.2 10*3/uL (ref 4.0–10.5)

## 2010-04-21 LAB — DIFFERENTIAL
Basophils Absolute: 0 K/uL (ref 0.0–0.1)
Basophils Relative: 1 % (ref 0–1)
Eosinophils Absolute: 0.1 10*3/uL (ref 0.0–0.7)
Eosinophils Relative: 3 % (ref 0–5)
Lymphocytes Relative: 27 % (ref 12–46)
Lymphs Abs: 1.1 10*3/uL (ref 0.7–4.0)
Monocytes Absolute: 0.4 K/uL (ref 0.1–1.0)
Monocytes Relative: 10 % (ref 3–12)
Neutro Abs: 2.5 K/uL (ref 1.7–7.7)
Neutrophils Relative %: 59 % (ref 43–77)

## 2010-04-21 LAB — COMPREHENSIVE METABOLIC PANEL
ALT: 19 U/L (ref 0–35)
AST: 22 U/L (ref 0–37)
Alkaline Phosphatase: 72 U/L (ref 39–117)
CO2: 30 mEq/L (ref 19–32)
Calcium: 8.6 mg/dL (ref 8.4–10.5)
Chloride: 105 mEq/L (ref 96–112)
GFR calc Af Amer: >60 mL/min (ref >60)
GFR calc non Af Amer: >60 mL/min (ref >60)
Glucose, Bld: 148 mg/dL — ABNORMAL HIGH (ref 70–99)
Potassium: 2.9 mEq/L — ABNORMAL LOW (ref 3.5–5.1)
Sodium: 141 mEq/L (ref 135–145)
Total Bilirubin: 0.6 mg/dL (ref 0.3–1.2)

## 2010-04-21 LAB — URINALYSIS, ROUTINE W REFLEX MICROSCOPIC
Bilirubin Urine: NEGATIVE
Glucose, UA: NEGATIVE mg/dL
Hgb urine dipstick: NEGATIVE
Ketones, ur: NEGATIVE mg/dL
Nitrite: NEGATIVE
Protein, ur: NEGATIVE mg/dL
Specific Gravity, Urine: 1.027 (ref 1.005–1.030)
Urobilinogen, UA: 1 mg/dL (ref 0.0–1.0)
pH: 6 (ref 5.0–8.0)

## 2010-04-21 LAB — COMPREHENSIVE METABOLIC PANEL WITH GFR
Albumin: 3 g/dL — ABNORMAL LOW (ref 3.5–5.2)
BUN: 9 mg/dL (ref 6–23)
Creatinine, Ser: 0.79 mg/dL (ref 0.4–1.2)
Total Protein: 5.7 g/dL — ABNORMAL LOW (ref 6.0–8.3)

## 2010-04-21 LAB — TROPONIN I: Troponin I: 0.03 ng/mL (ref 0.00–0.06)

## 2010-04-21 LAB — CK TOTAL AND CKMB (NOT AT ARMC)
CK, MB: 2.4 ng/mL (ref 0.3–4.0)
Relative Index: INVALID (ref 0.0–2.5)
Total CK: 49 U/L (ref 7–177)

## 2010-04-21 LAB — BRAIN NATRIURETIC PEPTIDE: Pro B Natriuretic peptide (BNP): 81.8 pg/mL (ref 0.0–100.0)

## 2010-04-23 LAB — BASIC METABOLIC PANEL
BUN: 6 mg/dL (ref 6–23)
Chloride: 100 mEq/L (ref 96–112)
Creatinine, Ser: 0.59 mg/dL (ref 0.4–1.2)
Glucose, Bld: 85 mg/dL (ref 70–99)
Potassium: 4.2 mEq/L (ref 3.5–5.1)

## 2010-04-23 LAB — CBC
HCT: 38.9 % (ref 36.0–46.0)
Hemoglobin: 13.5 g/dL (ref 12.0–15.0)
MCV: 95.2 fL (ref 78.0–100.0)
RDW: 16.8 % — ABNORMAL HIGH (ref 11.5–15.5)
WBC: 6.8 10*3/uL (ref 4.0–10.5)

## 2010-04-28 LAB — BASIC METABOLIC PANEL
BUN: 2 mg/dL — ABNORMAL LOW (ref 6–23)
CO2: 29 mEq/L (ref 19–32)
Chloride: 94 mEq/L — ABNORMAL LOW (ref 96–112)
Creatinine, Ser: 0.5 mg/dL (ref 0.4–1.2)

## 2010-04-28 LAB — CBC
MCHC: 34.1 g/dL (ref 30.0–36.0)
MCV: 102.3 fL — ABNORMAL HIGH (ref 78.0–100.0)
Platelets: 154 10*3/uL (ref 150–400)
RDW: 14.5 % (ref 11.5–15.5)

## 2010-04-29 LAB — CBC
HCT: 32.7 % — ABNORMAL LOW (ref 36.0–46.0)
MCHC: 33.8 g/dL (ref 30.0–36.0)
MCV: 102.1 fL — ABNORMAL HIGH (ref 78.0–100.0)
MCV: 103.2 fL — ABNORMAL HIGH (ref 78.0–100.0)
Platelets: 193 10*3/uL (ref 150–400)
Platelets: 254 10*3/uL (ref 150–400)
RDW: 14.9 % (ref 11.5–15.5)
WBC: 7.4 10*3/uL (ref 4.0–10.5)

## 2010-04-29 LAB — URINALYSIS, ROUTINE W REFLEX MICROSCOPIC
Ketones, ur: NEGATIVE mg/dL
Nitrite: NEGATIVE
Protein, ur: NEGATIVE mg/dL
Urobilinogen, UA: 0.2 mg/dL (ref 0.0–1.0)

## 2010-04-29 LAB — PROTIME-INR
INR: 0.9 (ref 0.00–1.49)
Prothrombin Time: 12.5 seconds (ref 11.6–15.2)

## 2010-04-29 LAB — BASIC METABOLIC PANEL
BUN: 10 mg/dL (ref 6–23)
BUN: 4 mg/dL — ABNORMAL LOW (ref 6–23)
Chloride: 92 mEq/L — ABNORMAL LOW (ref 96–112)
Chloride: 93 mEq/L — ABNORMAL LOW (ref 96–112)
Creatinine, Ser: 0.63 mg/dL (ref 0.4–1.2)
GFR calc non Af Amer: >60 mL/min (ref >60)
Glucose, Bld: 95 mg/dL (ref 70–99)
Potassium: 3.6 mEq/L (ref 3.5–5.1)

## 2010-04-29 LAB — DIFFERENTIAL
Eosinophils Absolute: 0 10*3/uL (ref 0.0–0.7)
Lymphs Abs: 0.9 10*3/uL (ref 0.7–4.0)
Neutrophils Relative %: 83 % — ABNORMAL HIGH (ref 43–77)

## 2010-04-29 LAB — TYPE AND SCREEN: Antibody Screen: NEGATIVE

## 2010-06-01 NOTE — H&P (Signed)
NAMETRUST, CRAGO              ACCOUNT NO.:  [id number]   MEDICAL RECORD NO.:  [id number]          PATIENT TYPE:  INP   LOCATION:  NA                           FACILITY:  [facility]   PHYSICIAN:  Madlyn Frankel. Charlann Boxer, M.D.  DATE OF BIRTH:  Nov 26, 1945   DATE OF ADMISSION:  03/27/2008  DATE OF DISCHARGE:  03/27/2008                              HISTORY & PHYSICAL   PROCEDURE:  Conversion right total hip replacement.   CHIEF COMPLAINTS:  Right hip pain.   HISTORY OF PRESENT ILLNESS:  A 65 year old female with a history of a  right intertrochanteric femur fracture fixation with a TK2 hip screw  with development of AVN and femoral head collapse.  She is being  presurgically assessed prior to admission.   PRIMARY CARE PHYSICIAN:  Dr. Bradd Canary.   CARDIOLOGIST:  Dr. Peter Swaziland.   PAST MEDICAL HISTORY:  Significant for:  1. Osteoarthritis.  2. Hypertension.  3. Coronary artery disease with heart stenting.  4. Anxiety depression.  5. Alcoholism.  6. Postmenopausal.  7. Cerebrovascular disease status post carotid endarterectomy on the      right in 1995.  8. Dyslipidemia.   PAST SURGICAL HISTORY:  1. Open reduction, internal fixation of a right hip intertrochanteric      fracture.  2. Carotid endarterectomy.  3. Coronary stenting.   FAMILY HISTORY:  COPD.   SOCIAL HISTORY:  Divorced  She is a current smoker requesting nicotine  patch at time of surgery.  She does have a primary caregiver, Corrie Dandy,  postoperatively in the home.   PAST DRUG ALLERGIES:  NO KNOWN DRUG ALLERGIES.   MEDICATIONS:  1. Calan slow release 120 mg 2 q.h.s.  2. Nexium 40 mg 1 p.o. q.a.m.  3. Clonazepam 1 mg 1 p.o. t.i.d. daily.  4. Lamictal 100 mg 4 daily 2 in the morning and 2 in the evening.  5. Diovan/HCT 160/12.5 one p.o. q.a.m.  6. Caduet 5/10 mg 1 p.o. q.a.m.  7. Abilify 15 mg 1 p.o. q.a.m.  8. Lexapro 10 mg 1 p.o. q.a.m.  9. Hydroxychloroquine 200 mg 1 p.o. q.a.m. and 1 p.o. q.p.m.  10.Caltrate  600 plus vitamin D 1200 one p.o. daily.  11.Darvocet N 100 two q.4 h. p.r.n. pain.  12.Robaxin 500 mg 1 p.o. t.i.d.  13.Nicotine patch 14 mg on 24 hours replaced with a new patch.   REVIEW OF SYSTEMS:  NEURO:  She has balance problems.  CARDIOVASCULAR:  Last EKG was done on April 01, 2008, stress test been performed on April 03, 2008.  MUSCULOSKELETAL:  She has back pain.  Otherwise see HPI.   PHYSICAL EXAMINATION:  VITAL SIGNS:  Pulse 72, respirations 16, blood  pressure 124/80.  GENERAL:  Awake, alert and oriented.  HEENT: Normocephalic.  NECK: Supple.  No carotid bruits.  CHEST/LUNGS:  Clear to auscultation bilaterally.  BREASTS:  Deferred.  HEART: S1-S2 distinct.  ABDOMEN:  Soft, bowel sounds present.  PELVIS:  Stable.  GENITOURINARY:  Deferred.  EXTREMITIES:  Right hip has increased pain with range of motion.  Uses a  cane.  SKIN:  No cellulitis.  NEUROLOGIC:  Intact.  Distal sensibilities.   LABORATORY DATA:  Labs, chest x-ray pending presurgical testing.   EKG done recently.  Undergone cardiac stress test prior to surgery.   IMPRESSION:  Failure of hardware in the right hip of AVN femoral head  collapse.   PLAN OF ACTION:  Conversion right total hip replaced by Dr. Charlann Boxer at  [facility] on April 14, 2008.  Risks, complications were  discussed.   Postoperatively, the patient is planning on discharge to home.  Postoperative medications were provided including Lovenox plus aspirin  for DVT prophylaxis.     ______________________________  Yetta Glassman [address], [place]      Madlyn Frankel. Charlann Boxer, M.D.  Electronically Signed    BLM/MEDQ  D:  04/03/2008  T:  04/03/2008  Job:  [id number]   cc:   Peter M. Swaziland, M.D.  Fax: [phone]   Quita Skye. Artis Flock, M.D.  Fax: [phone]

## 2010-06-01 NOTE — Op Note (Signed)
[name], BITTMAN              ACCOUNT NO.:  [id number]   MEDICAL RECORD NO.:  [id number]          PATIENT TYPE:  INP   LOCATION:  2550                         FACILITY:  [facility]   PHYSICIAN:  Almedia Balls. Ranell Patrick, M.D. DATE OF BIRTH:  1945/06/30   DATE OF PROCEDURE:  02/07/2007  DATE OF DISCHARGE:                               OPERATIVE REPORT   PREOPERATIVE DIAGNOSIS:  Right intertrochanteric fracture.   POSTOPERATIVE DIAGNOSIS:  Right intertrochanteric fracture.   OPERATION PERFORMED:  Open reduction internal fixation of right hip  using compression hip by DePuy.   SURGEON:  Almedia Balls. Ranell Patrick, M.D.   ASSISTANT:  Donnie Coffin. Durwin Nora, P.A.   ANESTHESIA:  General.   ESTIMATED BLOOD LOSS:  250 mL.   FLUIDS REPLACED:  1000 mL.   URINE OUTPUT:  250 mL.   INSTRUMENT COUNT:  Correct.   COMPLICATIONS:  None.   ANTIBIOTICS:  Perioperative antibiotics given.   INDICATIONS FOR PROCEDURE:  The patient is a 65 year old female who is  status post fall injuring her right hip.  Patient presented to the [facility] emergency room unable to bear weight complaining of right hip pain  and deformity.  The patient had an intertrochanteric hip fracture on x-  ray which is unstable.  The patient presents now for operative  stabilization.  Informed consent was obtained.   DESCRIPTION OF PROCEDURE:  After an adequate level of anesthesia was  achieved, the patient was positioned on a fracture table.  Perineal post  utilized, left leg placed in modified lithotomy position, right leg in  traction boot secured.  All neurovascular structures padded  appropriately.  We sterilely prepped and draped the right hip and used a  shower curtain.  Lateral skin incision created using the C-arm to  localize our incision and this was started at the greater trochanter  extending distally.  Dissection was carried sharply down through  subcutaneous tissues using Bovie.  The tensor fascia lata was split in  line with  the skin incision.  Vastus lateralis lifted anteriorly and we  divided the fascia posteriorly.  Using Cobb elevator mobilized the  vastus off the femur and then placed two Bennett retractors on the  anterior femoral cortex.  Next, using the C-arm to guide [place], we placed  guide pins on the inferior one third of the femoral neck on the AP view  and center-center on the lateral view.  We then overdrilled with a drill  bit to a 95 mm depth and then tapped.  We then placed a non-keyed lag  screw securely in the bone with a 4-hole 130 degree side plate which we  secured with four bicortical screws.  We thoroughly irrigated and then  closed over a  drain with interrupted Vicryl suture for the vastus fascia, interrupted  0 Vicryl for the tensor fascia lata followed by layered subcutaneous  closure with 0 and 2-0 Vicryl followed by 4-0 Monocryl for skin.  Steri-  Strips applied followed by sterile dressing.  The patient tolerated the  surgery well.      Almedia Balls. Ranell Patrick, M.D.  Electronically Signed  SRN/MEDQ  D:  02/07/2007  T:  02/08/2007  Job:  [id number]

## 2010-06-01 NOTE — Discharge Summary (Signed)
[name], [name]              ACCOUNT NO.:  [id number]   MEDICAL RECORD NO.:  [id number]          PATIENT TYPE:  INP   LOCATION:  5121                         FACILITY:  [facility]   PHYSICIAN:  Almedia Balls. Ranell Patrick, M.D. DATE OF BIRTH:  [birth year]   DATE OF ADMISSION:  02/07/2007  DATE OF DISCHARGE:  02/12/2007                               DISCHARGE SUMMARY   ADMISSION DIAGNOSIS:  Right femur fracture, status post fall.   DISCHARGE DIAGNOSIS:  Right femur fracture, status post fall.   BRIEF HISTORY:  Patient is a 65 year old female who sustained a ground  level fall, injuring her right femur.  Patient was admitted for surgical  management of the right femur.   PROCEDURE:  Patient had a right femur ORIF using the TK-2 compression  screw by DePuy by Almedia Balls. Ranell Patrick, M.D., on February 07, 2007.  Assistant was [employer]. Dixon, P.A.-C.  General anesthesia was used and  there was 250 mL of blood loss.  Fluid replacement was 1000 mL and no  complications.   HOSPITAL COURSE:  Patient was admitted on February 07, 2007, for the  above stated procedure, which she tolerated well.  After adequate time  in the post anesthesia care unit, she transferred up to 5100.  She was  alert but disoriented to time for several days after surgery.  Questioned whether or not this was her baseline.  Patient states she did  well at home with someone helping her, but due to the lack of safety,  would recommend skilled nursing facility for a short period of time  before discharge back to home.  Discharge planning was alerted early in  the hospital stay for skilled nursing facility planning.  Patient  continued with physical therapy and occupational therapy, although she  did get up and walk on her right hip several times without the  assistance of anyone else or a walker but no signs of injury.  Neurovascularly, she was intact and her wound was healing well.   DISCHARGE PLAN:  Patient discharged to a skilled  nursing facility on  February 12, 2007.   FOLLOW UP:  Patient to follow up with Almedia Balls. Ranell Patrick, M.D., in two  weeks.   CONDITION:  Guarded.   ALLERGIES:  NO KNOWN DRUG ALLERGIES.   DISCHARGE MEDICATIONS:  1. Norvasc 5 mg p.o. daily.  2. Abilify 5 mg p.o. daily.  3. Klonopin 1 mg p.o. b.i.d.  4. Hydrochlorothiazide 12.5 mg p.o. daily.  5. Avapro 300 mg p.o. daily.  6. Lamictal 200 mg p.o. daily.  7. Nicotine patch 21 mcg every third day.  8. Protonix 80 mg p.o. daily.  9. Zocor 20 mg p.o. daily.  10.Vicodin 5/325 one or two tablets q.4-6h. p.r.n. pain.  11.Robaxin 500 mg p.o. q.6h.  12.Coumadin per pharmacy protocol.   DIET:  Regular.      Thomas B. Durwin Nora, P.A.      Almedia Balls. Ranell Patrick, M.D.  Electronically Signed    TBD/MEDQ  D:  02/12/2007  T:  02/12/2007  Job:  [id number]

## 2010-06-01 NOTE — Op Note (Signed)
[name], [name]              ACCOUNT NO.:  [id number]   MEDICAL RECORD NO.:  [id number]          PATIENT TYPE:  INP   LOCATION:  0010                         FACILITY:  [facility]   PHYSICIAN:  Madlyn Frankel. Charlann Boxer, M.D.  DATE OF BIRTH:  September 06, 1945   DATE OF PROCEDURE:  04/15/2008  DATE OF DISCHARGE:                               OPERATIVE REPORT   PREOPERATIVE DIAGNOSIS:  Right hip avascular necrosis following open  reduction internal fixation of right intertrochanteric femur fracture in  2007.   POSTOPERATIVE DIAGNOSIS:  Right hip avascular necrosis following open  reduction internal fixation of right intertrochanteric femur fracture in  2007.   PROCEDURE:  Conversion of previous right hip surgery to a right total  hip replacement.  We were removed a four-hole side plate TK2 dynamic hip  screw and placed a DePuy hip system with a size 50 pinnacle cup, single  cancellous screw, 36 neutral metal liner and a 3 standard tri-lock stem  with a 36 +1.5 ball.   SURGEON:  Madlyn Frankel. Charlann Boxer, M.D.   ASSISTANT:  Dwyane Luo, P.A.   ANESTHESIA:  General.   BLOOD LOSS:  250 mL.   DRAINS:  One.   COMPLICATIONS:  None.   INDICATION FOR PROCEDURE:  [name] is a 66 year old female referred  for evaluation of right hip due to AVN finding with persistent recurring  hip pain.  She had a history of intertrochanteric femur fracture and was  fixed with a TK2 screw in 2007.  She had gone on to unite this fracture  with resolution of the hip fracture pain but developed hip pain  recently.  Radiographs revealed collapse.  She was sent over for  evaluation for surgery.  Risks and benefits of the procedure were  discussed including risks of infection, persistent discomfort, hip  failure, dislocation, need for revision surgery.  Consent was obtained  for the benefit of pain relief.   PROCEDURE IN DETAIL:  The patient was brought to operative theater.  Once adequate anesthesia and preoperative  antibiotics, Ancef,  administered, the patient was positioned into the left lateral decubitus  position with the right side up.  The right lower extremity was  prescrubbed, prepped and draped in sterile fashion.  Time out was  performed, identifying the patient, planned procedure and extremity.   The patient's TK2 incision was noted be a little more anterior in the  thigh than what I could incorporate comfortably into the hip incision,  so a separate incision was made and extended from the proximal aspect  near the gluteal region and extended over the trochanter laterally.   There was probably about a 5-cm bridge between the anterior incision and  this one.   Sharp dissection was carried down to the iliotibial band.  The plate was  identified prominent through the iliotibial band.  I then used a Bovie  to incise the iliotibial band and vastus lateralis fascia overlying the  plate.  Following plate exposure, I removed the compression screw and  then removed each of the 4 bicortical screws within the plate.  Then,  the lag screw  inserter was inserted into the lag screw and the screw  backed out.  Once I was able to get it to an appropriate level, the  whole plate and lag screw were removed.   We debrided some of the soft tissues off the lateral aspect of the  thigh.  I then irrigated the wound and reapproximated the iliotibial  band over top of the vastus lateralis with #1 Vicryl in a running  fashion.   Packed off the distal portions of this wound and now attended to the hip  joint.  Hip exposure was obtained by opening up the gluteal fascia  proximally.  I then took down the short external rotator separate from  the posterior capsule.  There was a little bit of scarring of this  layer, but it was identified.  An L capsulotomy was performed preserving  the posterior capsule for later anatomic repair and also to protect the  sciatic nerve from retractors.  Hip was dislocated carefully  without  complications.  There was no motion at the fracture site.   A neck osteotomy was made into the trochanteric fossa.  There was noted  be some coxa breva shortening the femoral neck through the fracture  site.   Following this, I used a starting canal initiator to open the proximal  femur and then used a hand reamer to identify the canal but also to ream  out the canal once.  I then irrigated the canal to prevent fat emboli.  I debrided some metal debris that was in the proximal femur and began  broaching with a 0 broach setting anteversion at around 20 degrees which  was basically at her native position after this fracture.  I broached up  to a size 2 initially which sat at the level where my neck cut was.  At  this point, I packed the femur and attended to the acetabulum.   Acetabular exposure was obtained, labrum and soft tissue debrided to  allow for exposure.  I began reaming with a 44 reamer, reaming down to  the medial wall.  I then reamed up to a 49 reamer.  A 50 Pinnacle cup  was then impacted at approximately 35-40 degrees of abduction and 20  degrees of forward flexion anatomically positioned.  So there was a  portion of the cup exposed superiolaterally but then a portion beneath  the anterior rim.   Given the cup position and the anteversion that I had on the femoral  stem, I went ahead and placed a single cancellous screw, a hole  eliminator and the final 36 metal liner.   At this point, I then went to the femoral side again for a trial  reduction with a 2 broach in place, initially trialed with a high offset  neck.  I did this with 36/1.5 ball.  There was a significant amount of  tension on the iliotibial band.  Based on this, I downsized and went to  the standard neck.  With this, there was an appropriate amount of  tension on the iliotibial band.  Leg lengths appeared to be improved,  and the hip stability was very good with a combined anteversion of 45-50   degrees.  Given these parameters, when I dislocated the hip, I found  that the 2 broach was a little bit loose with torsion, and based on  this, I went ahead and broached up to a 3 and placed a 3 standard stem.  This sat a little  bit more proud than the size 2 broach which was fine  by me for leg length as I had initially planned with the 2 in place to  use a +5 ball.   The final 3 stem was impacted.  It sat again a few millimeters proud of  where my neck cut was and retrialed with a 36/1.5 ball and was happy  with the stability, leg length.  There was no shuck.  The hip was very  stable without evidence of impingement.   The final 36/1.5 ball was then opened and impacted onto a clean and dry  trunnion.  The hip was reduced.  We irrigated the hip throughout the  case.  At this point, I reapproximated the posterior capsule to the  superior leaflet using #1 Vicryl.  The remainder of wound was closed in  layers with #1 Vicryl in the iliotibial band and gluteal fascia.  I did  place a medium Hemovac drain deep.   The subcutaneous layer was reapproximated using a 2-0 Vicryl, and I used  staples on the skin.  Skin was cleaned, dried and dressed sterilely with  a Mepilex dressing.  She was brought to the recovery in stable  condition, tolerating the procedure well.      Madlyn Frankel Charlann Boxer, M.D.  Electronically Signed     MDO/MEDQ  D:  04/15/2008  T:  04/15/2008  Job:  [id number]

## 2010-06-04 NOTE — H&P (Signed)
[name], [name] NO.:  [id number]   MEDICAL RECORD NO.:  [id number]          PATIENT TYPE:  INP   LOCATION:  1844                         FACILITY:  [facility]   PHYSICIAN:  Elliot Cousin, M.D.    DATE OF BIRTH:  02-11-1945   DATE OF ADMISSION:  05/15/2006  DATE OF DISCHARGE:                              HISTORY & PHYSICAL   PRIMARY CARE PHYSICIAN:  Quita Skye. Artis Flock, M.D.   CHIEF COMPLAINT:  Seizure.   HISTORY OF PRESENT ILLNESS:  The patient is a 65 year old woman with a  past medical history significant for coronary artery disease,  hypertension, depression and anxiety, who presents to the emergency  department with a witnessed seizure.  The patient's original intention  was to come to the emergency department for treatment of anxiety  symptoms.  She stopped taking Klonopin on May 12, 2006, because she  felt as if she was becoming too addicted to the medication.  After she  stopped taking Klonopin during the midday on May 12, 2006,  she began  having anxiety symptoms, shakiness, chest tightness, and sweatiness.  She took Tylenol PM  several times which tended to abate her symptoms a  little.  However, she states that her symptoms became too much to bear  and, therefore, wanted to come to the emergency department.  Per the  report from EMS, the patient's friend, who was present with the patient  at home, stated that the patient was mumbling, acting strangely, and was  becoming severely anxious.   On arrival to the emergency department, the patient apparently  experienced a grand mal seizure as witnessed by the EMS.  After a few  seconds, the patient was obviously postictal; however, she regained  responsiveness and was able to answer most of the questions posed to her  by the EMT.   When the patient arrived to the emergency department, she was evaluated  by the emergency department physician.  She was somewhat alert and  oriented. The CT scan of the head  ordered by the emergency department  physician revealed no acute intracranial findings.  The patient's lab  data are significant for a serum potassium of 2.6 and a serum sodium of  128.  Currently she is in no acute distress.  She does not recall having  a seizure.  She does endorse stopping the Klonopin on May 12, 2006,  and feeling a bit more anxious initially but severely anxious today.  She denies any recent fever, chills, vomiting, diarrhea, abdominal pain,  or painful urination. She has had some nausea.  She also has a chronic  cough from her tobacco use.  She says that her chest tightness had been  intermittent and primarily occurred when her anxiety increased.  She has  had no associated worsening shortness of breath, radiation of the  tightness, or lightheadedness.  Her Review of Systems is also positive  for a generalized headache without photophobia.   The patient will be admitted for further evaluation and management.   PAST MEDICAL HISTORY:  1. Coronary artery disease status post myocardial infarction with a  PCI and stenting of the right coronary artery in 2000.  According      to the records from September 2007, she had a previous Cardiolite      study that was negative.  2. Hypertension.  3. Cerebrovascular disease, peripheral vascular disease, status post      right carotid endarterectomy in 1995.  4. Depression with anxiety.  5. Hyperlipidemia.  6. Tobacco abuse.  7. History of alcohol abuse.  The patient has been abstinent from      alcohol for approximately 8 years.  8. History of hypokalemia and anemia during the September 2007      hospitalization.  9. Status post surgical repair of a right distal radial fracture and      right proximal humerus fracture in September 2007 (by Dr. Melvyn Novas).  10.Chronic left-sided facial tic.  11.History of syncopal episode in February 2008.  The MRI of the brain      was negative for any acute changes.   MEDICATIONS:   (The patient was only able to recall some of her  medications and doses.  The medications listed in part were taken from  the Discharge Summary from the September 2007 hospitalization.)  1. Clonazepam 0.5 mg 2 tablets every 4-6 hours.  2. Ditropan XL 10 mg nightly.  3. Caduet 5/10 mg daily.  4. Lexapro. (The patient believes she is not taking any more.)  5. Diovan/hydrochlorothiazide 120/12.5 mg daily.  6. Verapamil 120 mg 2 tablets daily.  7. Oxycodone 5 mg 1 tablet every 6 hours as needed.  8. Nexium 40 mg daily.  9. Wellbutrin XR.  (The patient says that she is not taking any more.)  10.Lamictal 100 mg 2 tablets in the morning and 2 tablets in the      evening.   ALLERGIES:  No known drug allergies.   SOCIAL HISTORY:  The patient is divorced. [place]  She had one child who was given up for adoption  The patient  smokes approximately 2 packs of cigarettes per day and has been doing so  for approximately 35-40 years.  She stopped drinking alcohol  approximately 8 years ago after abusing alcohol for many years.  She is  currently unemployed; however, disability is pending.  She is  financially supported by a friend, Ms. Roanna Banning.  (She is not  present today.)  The patient denies illicit drug use.   FAMILY HISTORY:  The patient's father is alive and suffers from heart  problems and COPD.  The mother died in her 67s of an aneurysm.  She has  four healthy siblings who are alive and one sister who died from  complications of alcohol abuse.   REVIEW OF SYSTEMS:  As above in History of Present Illness.   PHYSICAL EXAMINATION:  VITAL SIGNS: Temperature 97.8, blood pressure  138/60, pulse 88, respiratory rate 20, oxygen saturation 99% on 2 liters  of nasal cannula oxygen.  GENERAL:  The patient is a pleasant, alert, 65 year old Caucasian woman  who is currently sitting up in bed in no acute distress. HEENT:  Head is normocephalic and  atraumatic.  Pupils equal, round, and  reactive to light.  Extraocular movements are intact.  There is a left-  sided twitching of the left eye and face in general.  (The patient says  that this tic is chronic.)  Question mild ptosis on the left.  Conjunctivae are clear.  Sclerae are white.  Nasal mucosa is  dry.  No  sinus tenderness.  Oropharynx reveals fair dentition.  Mucous membranes  are mildly dry.  No posterior exudates or erythema.  NECK:  Supple with no adenopathy, no thyromegaly, no bruit, and no JVD.  There is a well-healed right-sided neck scar.  HEART:  S1, S2 with soft systolic murmur.  LUNGS: Clear to auscultation bilaterally but decreased breath sounds in  the bases.  ABDOMEN:  Mildly obese.  Positive bowel sounds.  Soft, nontender,  nondistended.  No hepatosplenomegaly.  No masses palpated.  EXTREMITIES:  Pedal pulses palpable bilaterally.  No pretibial edema and  no pedal edema.  NEUROLOGIC:  The patient was alert and oriented x3.  Cranial nerves II-  XII intact.  Strength 5/5 throughout.  Sensation intact.   ADMISSION LABORATORY DATA:  CT scan of the head reveals no acute  intracranial findings.   EKG reveals normal sinus rhythm, left atrial enlargement, and possible Q  waves in the anterior leads.   Chest x-ray reveals questionable mild vascular congestion and COPD.   Sodium 128, potassium 2.6, chloride 94, CO2 29, glucose 127, BUN 3,  creatinine 0.48.  Total bilirubin 0.4, alkaline phosphatase 94, SGOT 34,  SGPT 12, total protein 6.4, albumin 3.3, calcium 8.6.  WBC 5.6,  hemoglobin 11.0, platelets 298.  Urine drug screen negative.  Urinalysis  essentially negative.  PT 13.8, INR 1.0.  Lipase 18.  Alcohol level less  than 5.   ASSESSMENT:  1. Tonic-clonic seizure.  More than likely, the patient's seizure is      secondary to benzodiazepine withdrawal.  The patient has no prior      history of seizures and has had no recent head trauma over the past      4-6  weeks.  2. Electrolyte abnormalities including hyponatremia and hypokalemia.  3. Chronic anxiety, treated with Klonopin.  The patient says that she      has seen a psychiatrist in the past but not now.  She decided to      stop the Klonopin because of its addictive potential. The patient      has had no prior history of benzodiazepine withdrawal symptoms in      the past.  4. Hypertension.  The patient's blood pressure is well controlled.  5. Tobacco abuse.   PLAN:  1. The patient will be admitted for further evaluation and management.  2. She was given 1 mg of Ativan by the emergency department physician.  3. Will restart Klonopin at 1 mg 4 times a day.  P.R.N. Ativan will be      ordered as well.  4. Seizure precautions.  5. Will start IV fluid volume repletion with normal saline with      potassium chloride added.  The patient's electrolytes will be      monitored closely.  6. Potassium repletion orally and in IV fluids.  Will check a     magnesium level to rule out deficiency.  7. For further evaluation, will check an MRI of the brain.  8. Tobacco cessation counseling.  Will place a nicotine patch.  9. Will ask her family or friend to bring in her medications for      review.      Elliot Cousin, M.D.  Electronically Signed     DF/MEDQ  D:  05/15/2006  T:  05/15/2006  Job:  [id number]   cc:   Quita Skye. Artis Flock, M.D.

## 2010-06-04 NOTE — Consult Note (Signed)
[name], [name]              ACCOUNT NO.:  [id number]   MEDICAL RECORD NO.:  [id number]          PATIENT TYPE:  INP   LOCATION:  3705                         FACILITY:  [facility]   PHYSICIAN:  Genene Churn. Love, M.D.    DATE OF BIRTH:  07-22-1945   DATE OF CONSULTATION:  05/16/2006  DATE OF DISCHARGE:                                 CONSULTATION   This is on of several [facility] admissions for this 65-year-  old left-handed, white, divorced female with a known history of  hypertension, hyperlipidemia, status post right carotid endarterectomy  in 1997, chronic obstructive pulmonary disease with continued cigarette  use, former alcoholism having quit approximately eight years ago, and  recurrent falls.  She fell approximately three years ago down steps with  head trauma.  It is not clear what kind of workup she had at that time.  She states that she fell September 2007, sustaining a fracture of the  right upper arm.  She has had a syncopal episode some time in February  2008, and was evaluated at the [facility] by Dr. Okey Dupre.  She underwent an MRI study [facility] showing  evidence of biventricular white matter disease and bilateral pontine  disease.  She most likely has bipolar disorder and is [place]  mental health [place]  She has recently  had her clonazepam tapered and discontinued on May 05, 2006, but  became very anxious, was seen by Dr. Artis Flock, and had it restarted at 2 mg  twice per day instead of 0.5 mg b.i.d. on May 10, 2006.  She  discontinued the clonazepam on May 12, 2006, as well as her anti-  bipolar disorder medicines and her antidepressants.  She became anxious,  developed panicky situation, had tremulousness and called her friend who  came to see her and then had EMS called.  She then had what appeared to  be a witnessed generalized major motor seizure lasting seconds without  urinary or bowel incontinence or tongue biting but she does have a  history of urinary incontinence requiring her to wear depends.  She was  confused, slightly afterwards.   Her medications that she was supposed to be on before admission included  1. Clonazepam 2 mg b.i.d. which she had discontinued on May 12, 2006.  2. Ditropan XL 10 mg nightly.  3. Caduet 5-10 daily.  4. Lexapro 10 mg daily which she was not taking.  5. Verapamil dosage unknown.  6. Diovan/hydrochlorothiazide 120/12.5 daily.  7. Oxycodone p.r.n. for her right shoulder pain which she states she      was not taking.  8. Nexium 40 mg a day.  9. Wellbutrin 1 tablet dosage unknown per day.  10.Lamictal 200 mg b.i.d.   PHYSICAL EXAMINATION:  GENERAL APPEARANCE:  Well-developed white female  with left hemifacial spasm which began in the late 1990s.  VITAL SIGNS:  Blood pressure was 120/60 in the right arm, 110/60 in the  left arm, heart rate was 60.  There were no bruits.  She is status post right carotid endarterectomy.  NEUROLOGIC:  Mental status:  She scored 24/30, but she was easily  confused.  Missed most of her answers in the orientation question  portion.  She had increased form and flow of her words and content.  There were no active auditory or visual hallucinations.  Cranial nerve  examination:  Visual fields to be full.  Both disks were seen and flat.  The extraocular movements were full.  Corneals were present.  There was  a left seventh with associated left hemifacial spasm.  Her tongue was  midline.  Uvula was midline.  Gags were present.  Motor examination  revealed diffuse weakness and distally in her lower extremities.  She  had right arm weakness with right arm drift.  Sensory examination was  intact.  Deep tendon reflexes were 3+.  Plantar responses were  downgoing.  Gait examination was slightly wide based.  She had poor  tandem.  She could stand on her toes, she could stand on her heels.    LABORATORY DATA AT THE TIME OF ADMISSION:  White blood cell count 5600,  hemoglobin of 11, hematocrit 32.8, platelet count 298,000.  INR was 1.  Her sodium was 128, potassium 2.6, but she was on hydrochlorothiazide,  glucose 127, CO2 content was 29, BUN was 3.  Her liver function tests  were normal.  Repeat evaluation revealed sodium 136, potassium 3.6 in  the hospital.  Her CK was 274 which was elevated, upper limits of normal  177, CK-MB was 13.6, relative index 5.  Her TSH was 0.413.   IMPRESSION:  1. Single seizure, possibly a provoked seizure secondary clonazepam      withdrawal, code 345.10.  2. History of syncope sometime occurring in February 2008 with workup      in [place], [place], and with MRI showing some bilateral      white matter disease in [place], [place], rule out      epilepsy, code 780.2 versus 345.10.  3. Chronic obstructive pulmonary disease with continued cigarette use,      code 496.  4. Hypertension, code 796.2.  5. Hyperlipidemia, code 272.4.  6. Status post right carotid endarterectomy, code 433.10.  7. Chronic gait disorder, 281.2.  8. Involuntary movement involving the left face with hemifacial spasm,      possibly representing vascular which syndrome, code 781.0.  9. Suspect peripheral neuropathy, code 356.9.   PLAN:  Obtain EEG, MRI and vitamin B12 level for further evaluation.  Continue her on the clonazepam and not use anticonvulsants at this time.  I have talked with her about seizure precautions.  She does not drive a  car in any case.           ______________________________  Genene Churn. Sandria Manly, M.D.     JML/MEDQ  D:  05/16/2006  T:  05/16/2006  Job:  [id number]   cc:   Quita Skye. Artis Flock, M.D.

## 2010-06-04 NOTE — Op Note (Signed)
NAMESHARNESE, [name]              ACCOUNT NO.:  [id number]   MEDICAL RECORD NO.:  [id number]          PATIENT TYPE:  OIB   LOCATION:  5028                         FACILITY:  [facility]   PHYSICIAN:  Madelynn Done, MD  DATE OF BIRTH:  Oct 11, 1945   DATE OF PROCEDURE:  10/10/2005  DATE OF DISCHARGE:                                 OPERATIVE REPORT   PREOPERATIVE DIAGNOSES:  1. Closed right distal radius fracture with associated ulnar styloid      fracture.  2. Right two-part surgical neck proximal humerus fracture.  3. Tobacco use.  4. Coronary and peripheral vascular disease.  5. Malnutrition.  6. History of alcohol dependence.  7. Osteoporosis.   POSTOPERATIVE DIAGNOSES:  1. Closed right distal radius fracture with associated ulnar styloid      fracture.  2. Right two-part surgical neck proximal humerus fracture.  3. Tobacco use.  4. Coronary and peripheral vascular disease.  5. Malnutrition.  6. History of alcohol dependence.  7. Osteoporosis.   PROCEDURE:  1. Open treatment of right distal radius fracture, Colles type, with      internal fixation, without treatment of ulnar styloid fracture.  2. Open treatment of right proximal humerus two-part surgical neck      fracture with internal fixation.  3. Radiographs reviewed, right shoulder.  4. Radiographs reviewed, right distal radius.   ATTENDING SURGEON:  Dr. Bradly Bienenstock was scrubbed and present for the entire  procedure.   ASSISTANT:  None.   ANESTHESIA:  General via endotracheal airway.   SURGICAL IMPLANTS:  Distal radius VariAx distal radius locking plate system,  [employer]ker; AxSOS locking plate system, proximal humerus, Stryker; for the  distal radius 10 cc of cancellous allograft chips.   SURGICAL INDICATIONS:  [name] is a 65 year old right-hand dominant  female with osteoporosis who fell on an out-stretched right arm  approximately two and a half weeks ago. The patient was seen and evaluated  in the  clinic and noted to have a displaced distal radius fracture as well  as ipsilateral proximal humerus fracture. After evaluation in the clinic,  the patient was sent for preoperative evaluation and medical clearance. She  received clearance from her cardiologist. However, her preoperative testing  revealed a hemoglobin of 7.7. The patient received a blood transfusion, and  after being seen by her medical doctor, she was cleared to proceed to the  operating room. She was almost three wekks out from her injury. Risks,  benefits and alternative were discussed in detail with the patient who  signed and informed consent was obtained. Risks include but not limited to  nerve damage, bleeding, nonunion, malunion, need for further surgery, loss  of motion of the wrist, hardware failure.   DESCRIPTION OF PROCEDURE:  The patient was properly identified in the  preoperative holding area. A mark with a permanent marker was made on the  right upper extremity to indicate the correct operative site. The patient  was then brought back to the operating room and placed supine on the  anesthesia room table where general anesthesia was administered. The patient  received  1 g of IV Ancef prior to any incisions. The patient did have SBDs  and TED hose on throughout the entire case. A Foley catheter was placed at  the beginning of the case.  Given her two injuries, it was decided to first  proceed with the wrist, knowing that an attempted closed reduction and  pinning was going to be tried for the shoulder. In order to move the  shoulder, it was necessary to not have pins in his shoulder to allow access  to the wrist. A well-padded tourniquet was then placed on the right  brachium, sealed with a 1000 drape and the right upper extremity was then  prepped with Hibiclens and then sterilely draped. The skin incisions were  then marked out. The limb was then elevated. Using Esmarch exsanguination,  the tourniquet  inflated to 225 mmHg. Using the finger traps and 10 pounds of  weight, distraction was carried out distally through the end of the bed.  Standard incision in line with the FCR was then made through the skin and  subcutaneous tissue. The FCR was identified and then retracted radially. The  FPL was then elevated, and then the pronator quadratus was identified. An L-  shaped incision was then made in the pronator quadratus, and the fracture  site was then identified of the distal radius. The portions of the attempted  union were then taken down. The callus had formed on the volar periosteal  surface. After prying the fracture site open with a freer elevator as well  as a dental pick, fracture site was then opened up, and then using freers to  separate the two fragments, cancellous allograft 10 cc chips were then  placed as a bone graft into the void from a volar to dorsal direction. After  placement of the allograft, one 1.0-mm K wire was then placed from the  radial styloid across the fracture site in order to maintain the reduction.  This was confirmed using the mini C arm. After reduction was obtained, the  VariAx plate was then applied to the volar surface. Five locking screws were  then placed distally, and then 4 more proximal screws, bicortical fixation,  were then placed to fill all screw holes. After placement of the screws  using the mini C arm, AP, lateral and oblique images were obtained in order  to verify her screw length to verify that there were no screws penetrating  into the joint. The screws appeared to be of appropriate length and do not  appear into the radial carpal joint. Under live fluoro, the wrist was then  brought into full extension and full flexion with good motion of the wrist  and no change in the alignment. This wound was then thoroughly irrigated with saline solution. The pronator quadratus flap was then repaired over the  plate using a 3-0 Monocryl suture.  Subcutaneous tissues were then closed  with 4-0 Monocryl suture and the skin closed with interrupted horizontal  mattress 4-0 nylon sutures. Ten cc of 0.25% Marcaine were then injected into  the field for local anesthesia. After fixation of the distal radius, the  distal radial ulnar joint was checked in both neutral, supination and  pronation. There was no instability of the distal radial ulnar joint in  neutral or in supination. She had slight laxity in pronation. Tourniquet was  deflated down at 105 minutes. Adaptic was then placed over the wound. A  sterile compression dressing and a small volar splint was applied.  There was  good perfusion of the digits.   After the completion of the procedure, the mini C arm was then taken out of  the room, and a large image intensifier was then brought into the room. The  patient was then elevated into a beach-chair position. All pressure points  were well padded, and the head and neck were well protected. Once elevated  in the beach-chair position, the large image was then brought in, in order  to assist with a closed manipulation of the proximal humerus. Several  attempts were made at closed manipulation. However, there appeared to be  something blocking the reduction, and we were unable to obtain reduction by  closed method. The right wrist and lower part of the forearm were then  sealed with a drape. The shoulder from the mid clavicular region down to the  elbow were then prepped with Hibiclens. The area was then sterilely draped.  Using a deltopectoral approach, skin incision was then made through the skin  and subcutaneous tissue. Hemostasis was obtained with electrocautery. The  cephalic vein was identified, and it appeared to be better mobilized  medially. The patient did have a large rent within the deltoid muscle, and  then using blunt dissection with my finger both proximally and distally, the  fracture site was identified. There was no  sharp dissection used. It was all  with my finger, elevating the deltoid both inferiorly and laterally. The  fracture site was identified. A large amount of fracture hematoma was then  debrided. There did not appear to be biceps tendon or any other structures  incarcerated within the fracture fragments. She did have some hypertropic-  like callus formation along the distal end of the fracture site. The  hematoma and fracture site was debrided and evacuated. Once this was  removed, the fracture was then reduced with open method and then temporarily  held in place with a K wire more or less from an anterior to posterior  direction. After reduction was confirmed, AP, lateral, oblique and axillary  views were used with the large C arm to confirm the reduction. Inset of the  plate was then undertaken, making sure the slide the plate laterally to the biceps tendon. Primary plate fixation was then carried out, securing the  plate distally using the oblong hole in a neutral position. Once this was  applied, the more proximal screws were then placed into the proximal  humerus. One 4.0 cancellous screw was placed in the extensor hole, and the  remainder of the screws were then filled with 4-0 locking screws. All of the  screw holes except for one of the locking screws were filled. The most  posterior lateral screw was not used. These were appropriately measured with  the depth gauge as well as the drill bit in place, making sure not to  penetrate the articular surface of the proximal humerus. After the fixation  was achieved proximally, a 3.5 cortical screw and one more 4.0 locking screw  were then placed distally to fill the screw holes distally. After placement  of all screws and the plate, the shoulder was put through a range of motion.  There did not appear to any evidence of impingement. She had good motion  with good stability of the fracture site. The K wire was removed during  manipulation.  The patient did receive another gram of Ancef during the  procedure. The wound was then copiously irrigated with saline solution. The  deltoid fascia  was then reapproximated with 3-0 Monocryl suture to close  over the plate. The subcutaneous tissues were then closed with 4-0 Monocryl  suture. The skin was then closed with skin staples. Prior to closure of the  skin, a medium Hemovac drain was then placed out laterally. Adaptic was then  applied. A sterile compressive dressing was then applied. The patient was  then placed in an arm swing. She was then extubated and taken to recovery  room in good condition.   All counts were correct at the end of the case.   POSTOPERATIVE PLAN:  The patient will be admitted for IV antibiotics and  postoperative pain control. We will plan to get her in to see occupational  therapy, begin early motion of the glenohumeral joint. Will begin with  gentle pendulum exercises and progress. She will continue to mobilize the  wrist for a minimum of four weeks. We will do this in a short-arm cast. We  will continue to follow her radiographically. The K wire, if the site looks  okay, will plan to remove that after six weeks or more. At that point,  likely just continue immobilization of the wrist. Will make sure she gets  into therapy to move her fingers.   I was scrubbed and present for the entire procedure.   Radiographs:  Three views of the right shoulder and three view of the right  wrist were done. AP, lateral and axillary views of the right shoulder do  confirm the reduction of the proximal humerus with the screws in the  appropriate position. It does appear the plate is sitting just a touch off  of the bone proximally with good fixation to the bone more distally.   AP, lateral, and oblique views of the right wrist were obtained showing the good restoration of the radial inclination, height and volar tilt with the  above-mentioned hardware in  place.      Madelynn Done, MD  Electronically Signed     FWO/MEDQ  D:  10/10/2005  T:  10/11/2005  Job:  [id number]

## 2010-06-04 NOTE — Procedures (Signed)
EEG NUMBER:  [id number].   CLINICAL INFORMATION:  This patient is being evaluated for possible  tonic-clonic seizure after discontinuing Klonopin.  The patient is on  Klonopin, Norvasc, Zocor, Lamictal, Ativan, Protonix, potassium  chloride, Phenergan, oxycodone.   TECHNICAL DESCRIPTION:  This EEG was recorded during the awake state and  stage II sleep states.  The awake background activity is an inner  mixture of rhythms with low-voltage fast beta and alpha activities  present.  There is no evidence of any focal asymmetry.  Photic  stimulation was performed which did produce a driving response.  Hyperventilation testing produced no significant abnormalities.  There  was much muscle, eye blink, and movement artifact seen in portions of  this EEG.   No epileptiform activity was present.   IMPRESSION:  This is a normal EEG during the awake and stage II sleep  states.           ______________________________  Genene Churn. Sandria Manly, M.D.     ZOX:WRUE  D:  05/16/2006 15:25:52  T:  05/16/2006 20:45:04  Job #:  [id number]

## 2010-06-04 NOTE — Discharge Summary (Signed)
[name], [name]              ACCOUNT NO.:  [id number]   MEDICAL RECORD NO.:  [id number]          PATIENT TYPE:  INP   LOCATION:  5028                         FACILITY:  [facility]   PHYSICIAN:  Madelynn Done, MD  DATE OF BIRTH:  Dec 23, 1945   DATE OF ADMISSION:  10/10/2005  DATE OF DISCHARGE:  10/12/2005                                 DISCHARGE SUMMARY   DISCHARGE DIAGNOSES:  1. Right proximal humerus fracture.  2. Right distal radius fracture.  3. Coronary artery disease, cerebrovascular disease.  4. Tobacco use.  5. Osteoporosis.  6. Malnutrition.  7. Depression.   DISCHARGE MEDICATIONS:  1. Resume home medications and doses.  2. Vicodin 5/500 one to two tablets p.o. q.4-6 hours as needed for pain.  3. Nu-Iron 150 mg p.o. b.i.d.  4. Colace 100 mg p.o. b.i.d.   PROCEDURES AND DATES:  1. Open reduction internal fixation right proximal humerus on October 10, 2005.  2. Open reduction internal fixation of right distal radius on October 10, 2005.  3. 2 units blood transfusion for acute blood loss anemia on October 11, 2005.   CONSULTATIONS:  [facility].   ADMISSION HISTORY:  [name] is a 65 year old right-hand dominant female  who sustained a fall approximately 2-1/2 weeks ago.  She sustained the above-  noted fractures.  She was admitted to undergo the above procedures.   HOSPITAL COURSE:  Patient was admitted to the orthopedic service after  undergoing the above procedures.  She tolerated these procedures well under  general anesthesia.  Her postoperative hemoglobin, hematocrit was checked in  the recovery room and noted to be 9.6.  She was continued on oral pain  medication.  On the night of postoperative day number 1, the patient was  somewhat confused getting up out of bed and did remove her dressing.  The  drain remained in place.  Patient was seen and examined.  On postoperative  day number 1, the drain was removed and  a new dressing was applied to the  right shoulder.  Her splint remained intact on the right wrist.  Her  hemoglobin and hematocrit were checked and noted to be at 7.9.  Her  potassium was low at 3.0.  Potassium replacement and 2 units of packed red  blood cells were transfused.  The internal medicine service was consulted  who agreed with the above modifications.  Patient tolerated this well.  She  was seen and examined on postoperative day number 2.  Her hemoglobin was  greater than 10.  She was tolerating a regular diet.  She was getting up out  of bed.  She was afebrile.  Her vital signs were stable and normal.  Patient  was ready to be discharged home on postoperative day number 2.   HOSPITAL DIAGNOSES:  Hypokalemia and acute blood loss anemia.   RECOMMENDATIONS AND DISPOSITION:  Patient is to follow up in my office in 1  week for a wound check.  She is to continue the use  of the sling for comfort  and can gradual a little pendulum exercises for the right glenohumeral  joint.  She is going to follow up with her primary care doctor, Dr. Amparo Bristol,  this week to have her potassium looked at once again.  She is going to keep  her bandages on until I see her back in followup.  She is going to try to  keep her right hand elevated above her heart at all times.  Prior to her  discharge, the patient's discharge instructions were explained to her,  questions were answered, and the patient voiced understanding of the  discharge instructions.  Patient is going home with family.  She is not  going to be by herself.      Madelynn Done, MD  Electronically Signed     FWO/MEDQ  D:  10/14/2005  T:  10/14/2005  Job:  [id number]

## 2010-06-04 NOTE — Discharge Summary (Signed)
[name], [name]              ACCOUNT NO.:  [id number]   MEDICAL RECORD NO.:  [id number]          PATIENT TYPE:  INP   LOCATION:  1603                         FACILITY:  [facility]   PHYSICIAN:  Madlyn Frankel. Charlann Boxer, M.D.  DATE OF BIRTH:  1945-12-20   DATE OF ADMISSION:  04/15/2008  DATE OF DISCHARGE:  04/18/2008                               DISCHARGE SUMMARY   ADMITTING DIAGNOSES:  1. Osteoarthritis.  2. Hypertension.  3. Coronary artery disease with heart stenting.  4. Anxiety and depression.  5. Alcoholism.  6. Postmenopausal.  7. Cerebrovascular accident status post carotid endarterectomy on the      right in 1995.  8. Dyslipidemia.   DISCHARGE DIAGNOSIS:  1. Osteoarthritis.  2. Hypertension.  3. Coronary artery disease with heart stenting.  4. Anxiety and depression.  5. Alcoholism.  6. Postmenopausal.  7. Cerebrovascular disease status post carotid endarterectomy on the      right in 1995.  8. Dyslipidemia.  9. Postoperative hyponatremia.   HISTORY OF PRESENT ILLNESS:  A 65 year old female with a history of  right intertrochanteric femur fracture with TKA 2 fixation, development  of AVN and femoral head collapse, admitted for conversion to a right  total hip replacement.   CONSULTATION:  None.   PROCEDURE:  A conversion right total hip replacement by surgeon Dr.  Durene Romans.  Assistant - Environmental health practitioner PA-C.   LABORATORY DATA:  CBC final reading - white blood cells 8.9, hemoglobin  10.7, hematocrit 31.5, platelets 154.  White cell differential - no  significant abnormalities.  Coagulations - no significant abnormalities.  Metabolic panel - final reading sodium 130, potassium 3.5, BUN was 2,  creatinine 0.5, glucose 101.  Calcium 8.6.  UA was negative.   RADIOLOGY:  Chest two-view showed chronic interstitial lung markings, no  sign of active process.  Portable pelvis showed satisfactory appearance  after right total hip placement.   HOSPITAL COURSE:  The patient  underwent a conversion right total hip  replacement by surgeon Dr. Durene Romans, assistant Dwyane Luo.  She was  admitted to the orthopedic floor.  Her stay was unremarkable.  She  remained hemodynamically orthopaedically stable.  She made good progress  with physical therapy.  The dressing was changed.  No significant  drainage from her wound.  By day #3, she had met all criteria for  discharge home in stable and improved condition.   DISCHARGE DISPOSITION:  Discharged home in stable and improved  condition.   DISCHARGE DIET:  Heart-healthy.   DISCHARGE WOUND CARE:  Keep dry.   DISCHARGE PHYSICAL THERAPY:  Weightbearing as tolerated with use of a  rolling walker.   DISCHARGE MEDICATIONS:  1. Robaxin 500 mg one p.o. q.6h. p.r.n. muscle spasm pain.  2. Lamictal 100 mg two tablets q.a.m. and at bedtime.  3. Lexapro 10 mg p.o. q.a.m.  4. Abilify 15 mg one p.o. q.a.m.  5. Nexium 40 mg one p.o. daily.  6. Hydrochloroquinine 200 mg two tablets daily.  7. Caduet 5/10 p.o. daily.  8. Calan slow release 120 mg two daily at night.  9. Clonazepam 1  mg t.i.d.  10.Diovan HCT 160/12.5 mg p.o. daily.  11.Caltrate daily.  12.Aspirin 325 mg p.o. b.i.d. for 6 weeks.  13.Iron 325 mg p.o. t.i.d. for 2 weeks.  14.Colace 100 mg p.o. b.i.d. p.r.n.  15.MiraLax 17 grams p.o. daily p.r.n.  16.Norco 7.5/325 one to two p.o. q.4-6h. p.r.n. pain.   DISCHARGE FOLLOWUP:  Follow up with Dr. Charlann Boxer at phone number [phone] in  2 weeks for wound check.     ______________________________  Yetta Glassman. [address], [place]      Madlyn Frankel. Charlann Boxer, M.D.  Electronically Signed   BLM/MEDQ  D:  05/01/2008  T:  05/01/2008  Job:  [id number]   cc:   Quita Skye. Artis Flock, M.D.  Fax: [phone]   Peter M. [place], M.D.  Fax: [phone]

## 2010-06-04 NOTE — Consult Note (Signed)
[name], [name]              ACCOUNT NO.:  [id number]   MEDICAL RECORD NO.:  [id number]          PATIENT TYPE:  INP   LOCATION:  5028                         FACILITY:  [facility]   PHYSICIAN:  Hillery Aldo, M.D.   DATE OF BIRTH:  04/21/1945   DATE OF CONSULTATION:  10/11/2005  DATE OF DISCHARGE:                                   CONSULTATION   PRIMARY CARE PHYSICIAN:  Quita Skye. Artis Flock, M.D.   CARDIOLOGIST:  Peter M. Swazil[place], M.D.   ORTHOPEDIC SURGEON:  Madelynn Done, MD.   PERSON REQUESTING CONSULTATION:  Madelynn Done, MD.   REASON FOR CONSULTATION:  Anemia, hypokalemia.   HISTORY OF PRESENT ILLNESS:  The patient is a 65 year old female patient of  Dr. Artis Flock, who was admitted by Dr. Melvyn Novas on 10/10/2005 for elective repair  of a right distal radius and proximal humerus fracture.  The patient  apparently fell on the stairs approximately 3 weeks ago, sustaining this  injury.  There was no syncope.  The patient simply lost her balance and fell  on her right side, sustaining the injuries, as noted.  The patient saw Dr.  Swaziland for preoperative clearance and reportedly had a negative stress test  done.  Postoperatively, she was found to be anemic and hypokalemic, and we  were therefore asked to consult to help elucidate and treat these issues.   PAST MEDICAL HISTORY:  1. Hypertension.  2. Coronary artery disease, status post MI, with PCI and stenting of the      right coronary artery in 2000; Cardiolite negative in 2002, with recent      stress test also negative.  3. Cerebrovascular disease, status post carotid endarterectomy on the      right in 1995.  4. Depression/anxiety.  5. Hypercholesteremia.  6. Tobacco abuse.  7. History of alcohol abuse.   CURRENT MEDICATIONS:  1. Ditropan XL 10 mg q.h.s.  2. Clonazepam 2 mg 1/2 tab t.i.d.  3. Caduet 5/10 1 tab daily.  4. Lexapro 30 mg q.a.m.  5. Diovan HCT 120/12.5 daily.  6. Verapamil HCL 120 mg 2 tabs daily.  7.  Oxycodone 5/325 1 to 2 tabs q.4-6 h. p.r.n.  8. Nexium 40 mg daily.  9. Wellbutrin SR 200 mg b.i.d.  10.Lamictal 100 mg 2 q.a.m., 1 q.h.s.   ALLERGIES:  none.   SOCIAL HISTORY:  The patient is divorced.  She lives alone.  She smokes  approximately 1 pack per day.  No alcohol for the past 7 1/2 years, but has  a history of alcohol abuse.  She does not have any offspring.  She is  currently unemployed, worked as a Catering manager in the past.   FAMILY HISTORY:  The patient's father is alive at age 13, and suffers with  heart problems and COPD.  Mother died in her 28s of an aneurysm.  She also  suffered with depression.  She has 4 healthy siblings that are alive, and 1  sister who died from complications of alcohol abuse.   REVIEW OF SYSTEMS:  The patient has not had any fever or  chills.  No weight  loss.  She has longstanding muscle twitching of the face, which is chronic  for the past 10 years, and she states that she gets Botox injections for it  q.3 months.  No chest pain, shortness of breath.  Appetite is normal.  No  change of bowel habits.  No melena or hematochezia.  No weakness or  paresthesias   PHYSICAL EXAMINATION:  VITAL SIGNS:  Temperature 97.9, blood pressure  139/80, pulse 74, respirations 20.  GENERAL:  Well-developed, well-nourished female in no acute distress, with a  prominent facial twitch.  HEENT:  Normocephalic, atraumatic, PERRL, EOMI, oropharynx is clear.  Prominent facial twitching.  NECK:  Supple, no thyromegaly, no lymphadenopathy, no jugular venous  distention.  She does have a carotid endarterectomy scar on the right.  HEART:  Regular rate and rhythm, no murmurs, rubs, gallops.  ABDOMEN:  Soft, tender, nondistended, normoactive bowel sounds.  EXTREMITIES:  No clubbing, cyanosis or edema of the lower extremities.  She  has prominent swelling of the right upper extremity at the surgical site.  She is able to move her fingers well.  NEUROLOGIC:  Alert and  oriented x3.  Somewhat slow to recall some  significant details in her family history, but question as whether this is  post-anesthesia effect.  Neuro exam is nonfocal, except for facial  twitching.   LABORATORY DATA:  Sodium is 132, potassium 3.0, chloride 96, bicarb 28, BUN  4, creatinine 0.6, glucose 118.  LFTs are within normal limits.  She has a  low albumin at 2.5, total protein low at 5.1.  Hemoglobin this morning is  7.8, hematocrit 24.1.   ASSESSMENT AND PLAN:  1. Microcytic anemia:  The patient's microcytic anemia was noted prior to      her surgery.  Her hemoglobin was 9.8 on 10/05/2005.  Her MCV was 79.6 at      that time.  Unfortunately, she is currently receiving packed red blood      cells, so we cannot do iron studies, as these would be affected by the      transfusion specimen.  The most likely explanation is this represents      acute blood loss anemia in the setting of chronic iron deficiency      anemia.  I agree that she needs packed red blood cell supplementation.      Would heme check her stools, and supplement with iron.  Follow her      hemoglobin and hematocrit serially.  Consideration should be made for      an outpatient GI evaluation.  2. Hypokalemia:  This is likely a side effect of her antihypertensive      treatment.  Agree with the supplementation that has been ordered.      Would check a magnesium level to insure that this is not low as well.      Follow her potassium levels daily to insure stability.  3. Protein calorie malnutrition:  Would obtain a dietitian consult.  4. Hypertension:  The patient's hypertension is currently well controlled      on her usual medications, which have been continued in the hospital.   Thank you for this consultation.  We will follow the patient with you.      Hillery Aldo, M.D.  Electronically Signed     CR/MEDQ  D:  10/11/2005  T:  10/12/2005  Job:  [id number]  cc:   Quita Skye. Artis Flock, M.D.  Peter M. [place],  M.D.  Madelynn Done, MD

## 2010-06-27 ENCOUNTER — Emergency Department (HOSPITAL_COMMUNITY): Payer: Medicare Other

## 2010-06-27 ENCOUNTER — Emergency Department (HOSPITAL_COMMUNITY)
Admission: EM | Admit: 2010-06-27 | Discharge: 2010-06-27 | Disposition: A | Discharge status: Home / Self Care | Payer: Medicare Other | Attending: Emergency Medicine | Admitting: Emergency Medicine

## 2010-06-27 DIAGNOSIS — M79609 Pain in unspecified limb: Secondary | ICD-10-CM | POA: Insufficient documentation

## 2010-06-27 DIAGNOSIS — I10 Essential (primary) hypertension: Secondary | ICD-10-CM | POA: Insufficient documentation

## 2010-06-27 DIAGNOSIS — Z96649 Presence of unspecified artificial hip joint: Secondary | ICD-10-CM | POA: Insufficient documentation

## 2010-06-27 DIAGNOSIS — Y92009 Unspecified place in unspecified non-institutional (private) residence as the place of occurrence of the external cause: Secondary | ICD-10-CM | POA: Insufficient documentation

## 2010-06-27 DIAGNOSIS — T07XXXA Unspecified multiple injuries, initial encounter: Secondary | ICD-10-CM | POA: Insufficient documentation

## 2010-06-27 DIAGNOSIS — W010XXA Fall on same level from slipping, tripping and stumbling without subsequent striking against object, initial encounter: Secondary | ICD-10-CM | POA: Insufficient documentation

## 2010-06-27 DIAGNOSIS — I252 Old myocardial infarction: Secondary | ICD-10-CM | POA: Insufficient documentation

## 2010-06-27 DIAGNOSIS — M7989 Other specified soft tissue disorders: Secondary | ICD-10-CM | POA: Insufficient documentation

## 2010-06-27 DIAGNOSIS — I2510 Atherosclerotic heart disease of native coronary artery without angina pectoris: Secondary | ICD-10-CM | POA: Insufficient documentation

## 2010-10-07 LAB — CBC
HCT: 32.3 — ABNORMAL LOW
Hemoglobin: 10.9 — ABNORMAL LOW
Hemoglobin: 11.1 — ABNORMAL LOW
MCHC: 33.7
MCHC: 33.9
MCHC: 34.1
MCHC: 34.5
MCV: 104.2 — ABNORMAL HIGH
MCV: 105.7 — ABNORMAL HIGH
MCV: 106.4 — ABNORMAL HIGH
Platelets: 187
Platelets: 203
Platelets: 250
RBC: 3.1 — ABNORMAL LOW
RBC: 3.66 — ABNORMAL LOW
RDW: 15.9 — ABNORMAL HIGH
RDW: 16.2 — ABNORMAL HIGH
WBC: 14.5 — ABNORMAL HIGH
WBC: 9.6

## 2010-10-07 LAB — BASIC METABOLIC PANEL
BUN: 16
BUN: 3 — ABNORMAL LOW
BUN: 4 — ABNORMAL LOW
CO2: 27
CO2: 33 — ABNORMAL HIGH
Calcium: 8.9
Calcium: 8.9
Calcium: 9
Chloride: 102
Chloride: 95 — ABNORMAL LOW
Chloride: 98
Creatinine, Ser: 0.43
Creatinine, Ser: 0.46
Creatinine, Ser: 0.82
GFR calc Af Amer: >60
GFR calc Af Amer: >60
Glucose, Bld: 110 — ABNORMAL HIGH
Glucose, Bld: 115 — ABNORMAL HIGH
Sodium: 137

## 2010-10-07 LAB — URINALYSIS, ROUTINE W REFLEX MICROSCOPIC
Bilirubin Urine: NEGATIVE
Ketones, ur: NEGATIVE
Nitrite: NEGATIVE
Urobilinogen, UA: 0.2

## 2010-10-07 LAB — PROTIME-INR
INR: 0.9
INR: 0.9
INR: 1.3
INR: 2.4 — ABNORMAL HIGH
Prothrombin Time: 12.1
Prothrombin Time: 12.7
Prothrombin Time: 13.3

## 2010-10-07 LAB — APTT: aPTT: 33

## 2010-10-07 LAB — DIFFERENTIAL
Basophils Relative: 0
Eosinophils Absolute: 0.1
Lymphs Abs: 0.8
Neutro Abs: 13 — ABNORMAL HIGH
Neutrophils Relative %: 89 — ABNORMAL HIGH

## 2010-10-08 DIAGNOSIS — L719 Rosacea, unspecified: Secondary | ICD-10-CM | POA: Insufficient documentation

## 2010-12-07 DIAGNOSIS — G500 Trigeminal neuralgia: Secondary | ICD-10-CM | POA: Insufficient documentation

## 2010-12-14 ENCOUNTER — Ambulatory Visit: Payer: Medicare Other | Attending: Gynecologic Oncology | Admitting: Gynecologic Oncology

## 2010-12-14 ENCOUNTER — Encounter: Payer: Self-pay | Admitting: Gynecologic Oncology

## 2010-12-14 VITALS — BP 118/64 | HR 64 | Temp 98.1°F | Resp 16 | Ht 67.0 in | Wt 146.2 lb

## 2010-12-14 DIAGNOSIS — Z90710 Acquired absence of both cervix and uterus: Secondary | ICD-10-CM | POA: Insufficient documentation

## 2010-12-14 DIAGNOSIS — Z79899 Other long term (current) drug therapy: Secondary | ICD-10-CM | POA: Insufficient documentation

## 2010-12-14 DIAGNOSIS — F17200 Nicotine dependence, unspecified, uncomplicated: Secondary | ICD-10-CM | POA: Insufficient documentation

## 2010-12-14 DIAGNOSIS — I10 Essential (primary) hypertension: Secondary | ICD-10-CM | POA: Insufficient documentation

## 2010-12-14 DIAGNOSIS — Z96659 Presence of unspecified artificial knee joint: Secondary | ICD-10-CM | POA: Insufficient documentation

## 2010-12-14 DIAGNOSIS — N900 Mild vulvar dysplasia: Secondary | ICD-10-CM

## 2010-12-14 DIAGNOSIS — N879 Dysplasia of cervix uteri, unspecified: Secondary | ICD-10-CM | POA: Insufficient documentation

## 2010-12-14 DIAGNOSIS — N893 Dysplasia of vagina, unspecified: Secondary | ICD-10-CM | POA: Insufficient documentation

## 2010-12-14 DIAGNOSIS — I252 Old myocardial infarction: Secondary | ICD-10-CM | POA: Insufficient documentation

## 2010-12-14 DIAGNOSIS — Z96649 Presence of unspecified artificial hip joint: Secondary | ICD-10-CM | POA: Insufficient documentation

## 2010-12-14 NOTE — Progress Notes (Signed)
Consult Note: Gyn-Onc  [name] 65 y.o. female  CC: Patient is seen today in consultation at the request of Dr. Ilda Mori. Chief Complaint  Patient presents with  . Abnormal Pap Smear    HPI: [name] is a very pleasant 59 year old with a 10 year history of abnormal Pap smears. In April 2011 she had a Pap smear revealed atypical squamous cells cannot exclude a high-grade dysplasia. She was worked up and evaluated and subsequently underwent a vaginal hysterectomy in June of 2011. Pathology at that time revealed the cervix with high-grade squamous dysplasia. Focal adenomyosis. In addition, the ectocervical margin of the cervix was positive.  She was seen by Dr. Oscar La on October 29 at which time a Pap smear revealed VAIN2/VAIN 3.  She comes in today for evaluation of the above. As stated she had an abnormal Pap smear history for the past 10 years. Since her surgery she occasionally complains of some left sided groin pain which has been evaluated to discuss by Dr. Arlyce Dice he does not believe that it is a significant consequence at this time.  The pain is intermittent does not require pain medication this is fleeting and does not wake her up at night. It is not appear to be related to bowel or bladder habits. She states that at the time of her vaginal hysterectomy she wanted to keep her ovaries she is now status she requests that decision. I discussed with her that persistent vaginal dysplasia was not related at all to her decision to keep her ovaries.  Interval History: As above.  Review of Systems: She denies any chest pains or shortness of breath. She suffers from blepharospasm and receives Botox injections for this. She denies any other abdominal pain. She denies any change in her bowel or bladder habits vaginal bleeding vaginal discharge. 10 point review of systems is otherwise negative.  Current Meds:  Outpatient Encounter Prescriptions as of 12/14/2010  Medication Sig Dispense  Refill  . ARIPiprazole (ABILIFY) 15 MG tablet Take 15 mg by mouth daily.        . calcium-vitamin D (OSCAL WITH D) 500-200 MG-UNIT per tablet Take 1 tablet by mouth daily.        . cholecalciferol (VITAMIN D) 1000 UNITS tablet Take 2,000 Units by mouth daily.        . clonazePAM (KLONOPIN) 1 MG tablet Take 1 mg by mouth 3 (three) times daily.        . cloNIDine (CATAPRES) 0.2 MG tablet Take 0.2 mg by mouth 2 (two) times daily.        . hydrochlorothiazide (HYDRODIURIL) 25 MG tablet Take 25 mg by mouth daily.        Marland Kitchen lamoTRIgine (LAMICTAL) 100 MG tablet Take 200 mg by mouth daily.        . metroNIDAZOLE (METROCREAM) 0.75 % cream Apply topically 2 (two) times daily.        . Multiple Vitamin (MULTIVITAMIN) capsule Take 1 capsule by mouth daily.        Marland Kitchen nystatin cream (MYCOSTATIN) Apply topically 2 (two) times daily.        . quinapril (ACCUPRIL) 40 MG tablet Take 40 mg by mouth at bedtime.        . sertraline (ZOLOFT) 50 MG tablet Take 50 mg by mouth daily.        . simvastatin (ZOCOR) 10 MG tablet Take 10 mg by mouth at bedtime.        . triamcinolone cream (KENALOG)  0.1 % Apply topically 2 (two) times daily.          Allergy: No Known Allergies  Social Hx:   History   Social History  . Marital Status: Divorced    Spouse Name: N/A    Number of Children: N/A  . Years of Education: N/A   Occupational History  . Not on file.   Social History Main Topics  . Smoking status: Current Everyday Smoker -- 2.0 packs/day for 50 years    Types: Cigarettes  . Smokeless tobacco: Not on file  . Alcohol Use: No  . Drug Use: No  . Sexually Active: No   Other Topics Concern  . Not on file   Social History Narrative  . No narrative on file    Past Surgical Hx:  Past Surgical History  Procedure Date  . Abdominal hysterectomy   . Total hip arthroplasty 2010    both sides  . Total knee arthroplasty 2011    Right knee  . Cartoid artery     Past Medical Hx:  Past Medical History    Diagnosis Date  . Hypertension   . Arthritis   . Myocardial infarction   . Depression   . Alcoholism     recovering since 2000    Family Hx:  Family History  Problem Relation Age of Onset  . Hypertension Mother   . Hypertension Father     Vitals:  Blood pressure 118/64, pulse 64, temperature 98.1 F (36.7 C), temperature source Oral, resp. rate 16, height 5\' 7"  (1.702 m), weight 146 lb 3.2 oz (66.316 kg).  Physical Exam:  Well-nourished well-developed female in no acute distress. External genitalia within normal limits. The vagina is markedly atrophic. There is a fixed discharge noted at the top of the vagina. It was wiped away in an erythematous lesion was noted at the top of the vagina. Proctoscopy was performed after the application of acetic acid. The entire upper vagina is cover with an acetowhite epithelial change. After obtaining the patient's verbal consent a biopsy was performed. Silver nitrate was used for hemostasis.   Assessment/Plan:  65 year old with multiple medical problems who has a long history of cervical and now vaginal dysplasia. She had a lengthy discussion regarding smoking cessation and the impact of smoking has a persistent dysplasia.  It would be beneficial for her to quit smoking with regards to this as well as her cardiac disease and she is known to have a cardiac stent after having a myocardial infarction in 2000. We had a discussion regarding options including using 5-FU to the upper vagina followed by topical estrogen for the next 3 months to see if it would resolve the lesion. We also discussed CO2 ablation in the operating room. After our discussion she wishes to proceed with CO2 ablation of the upper vagina. This will be scheduled accordingly. We'll probably need to get some preoperative evaluation and perioperative risk stratification from her cardiologist.  I believe this can be done under conscious sedation and local anesthesia.  First and benefits of  the procedure were discussed with the patient and her questions were answered to her satisfaction.   [name] A., MD 12/14/2010, 5:05 PM

## 2011-01-19 DIAGNOSIS — D485 Neoplasm of uncertain behavior of skin: Secondary | ICD-10-CM | POA: Diagnosis not present

## 2011-01-19 DIAGNOSIS — L719 Rosacea, unspecified: Secondary | ICD-10-CM | POA: Diagnosis not present

## 2011-01-24 ENCOUNTER — Other Ambulatory Visit: Payer: Self-pay | Admitting: *Deleted

## 2011-01-31 ENCOUNTER — Other Ambulatory Visit: Payer: Self-pay | Admitting: Family Medicine

## 2011-01-31 DIAGNOSIS — Z1231 Encounter for screening mammogram for malignant neoplasm of breast: Secondary | ICD-10-CM

## 2011-02-02 ENCOUNTER — Encounter (HOSPITAL_BASED_OUTPATIENT_CLINIC_OR_DEPARTMENT_OTHER): Payer: Self-pay | Admitting: *Deleted

## 2011-02-02 NOTE — Progress Notes (Signed)
NPO AFTER MN. ARRIVES AT 0600. NEEDS ISTAT AND EKG. WILL TAKE CATAPRES AND KLONOPIN AM OF SURG. W/ SIP OF WATER. MDA TO REVIEW DUE TO HX POST OP DELIRUIM (SHOULD) NOT BE AN ISSUE FOR THIS PROCEDURE.

## 2011-02-07 ENCOUNTER — Other Ambulatory Visit: Payer: Self-pay | Admitting: *Deleted

## 2011-02-07 DIAGNOSIS — G894 Chronic pain syndrome: Secondary | ICD-10-CM | POA: Diagnosis not present

## 2011-02-07 DIAGNOSIS — M545 Low back pain: Secondary | ICD-10-CM | POA: Diagnosis not present

## 2011-02-08 ENCOUNTER — Encounter (HOSPITAL_BASED_OUTPATIENT_CLINIC_OR_DEPARTMENT_OTHER)
Admission: RE | Disposition: A | Discharge status: Home / Self Care | Payer: Self-pay | Source: Ambulatory Visit | Attending: Gynecologic Oncology

## 2011-02-08 ENCOUNTER — Encounter (HOSPITAL_BASED_OUTPATIENT_CLINIC_OR_DEPARTMENT_OTHER): Payer: Self-pay | Admitting: Anesthesiology

## 2011-02-08 ENCOUNTER — Encounter (HOSPITAL_BASED_OUTPATIENT_CLINIC_OR_DEPARTMENT_OTHER): Payer: Self-pay | Admitting: *Deleted

## 2011-02-08 ENCOUNTER — Other Ambulatory Visit: Payer: Self-pay

## 2011-02-08 ENCOUNTER — Ambulatory Visit (HOSPITAL_BASED_OUTPATIENT_CLINIC_OR_DEPARTMENT_OTHER): Payer: Medicare Other | Admitting: Anesthesiology

## 2011-02-08 ENCOUNTER — Ambulatory Visit (HOSPITAL_BASED_OUTPATIENT_CLINIC_OR_DEPARTMENT_OTHER)
Admission: RE | Admit: 2011-02-08 | Discharge: 2011-02-08 | Disposition: A | Discharge status: Home / Self Care | Payer: Medicare Other | Source: Ambulatory Visit | Attending: Gynecologic Oncology | Admitting: Gynecologic Oncology

## 2011-02-08 DIAGNOSIS — Z96659 Presence of unspecified artificial knee joint: Secondary | ICD-10-CM | POA: Insufficient documentation

## 2011-02-08 DIAGNOSIS — F329 Major depressive disorder, single episode, unspecified: Secondary | ICD-10-CM | POA: Insufficient documentation

## 2011-02-08 DIAGNOSIS — F3289 Other specified depressive episodes: Secondary | ICD-10-CM | POA: Insufficient documentation

## 2011-02-08 DIAGNOSIS — N900 Mild vulvar dysplasia: Secondary | ICD-10-CM | POA: Diagnosis not present

## 2011-02-08 DIAGNOSIS — I252 Old myocardial infarction: Secondary | ICD-10-CM | POA: Insufficient documentation

## 2011-02-08 DIAGNOSIS — N893 Dysplasia of vagina, unspecified: Secondary | ICD-10-CM | POA: Diagnosis not present

## 2011-02-08 DIAGNOSIS — I10 Essential (primary) hypertension: Secondary | ICD-10-CM | POA: Insufficient documentation

## 2011-02-08 DIAGNOSIS — D072 Carcinoma in situ of vagina: Secondary | ICD-10-CM | POA: Insufficient documentation

## 2011-02-08 DIAGNOSIS — Z79899 Other long term (current) drug therapy: Secondary | ICD-10-CM | POA: Insufficient documentation

## 2011-02-08 DIAGNOSIS — F1021 Alcohol dependence, in remission: Secondary | ICD-10-CM | POA: Insufficient documentation

## 2011-02-08 DIAGNOSIS — Z87891 Personal history of nicotine dependence: Secondary | ICD-10-CM | POA: Insufficient documentation

## 2011-02-08 DIAGNOSIS — Z96649 Presence of unspecified artificial hip joint: Secondary | ICD-10-CM | POA: Insufficient documentation

## 2011-02-08 DIAGNOSIS — M129 Arthropathy, unspecified: Secondary | ICD-10-CM | POA: Diagnosis not present

## 2011-02-08 HISTORY — DX: ST elevation (STEMI) myocardial infarction involving other coronary artery of inferior wall: I21.19

## 2011-02-08 HISTORY — DX: Rosacea, unspecified: L71.9

## 2011-02-08 HISTORY — DX: Bipolar disorder, unspecified: F31.9

## 2011-02-08 HISTORY — DX: Dorsalgia, unspecified: M54.9

## 2011-02-08 HISTORY — DX: Other specified postprocedural states: Z98.890

## 2011-02-08 HISTORY — DX: Peripheral vascular disease, unspecified: I73.9

## 2011-02-08 HISTORY — DX: Bell's palsy: G51.0

## 2011-02-08 HISTORY — DX: Other chronic pain: G89.29

## 2011-02-08 HISTORY — DX: Atherosclerotic heart disease of native coronary artery without angina pectoris: I25.10

## 2011-02-08 HISTORY — DX: Adverse effect of unspecified general anesthetics, initial encounter: T41.205A

## 2011-02-08 HISTORY — DX: Presence of coronary angioplasty implant and graft: Z95.5

## 2011-02-08 HISTORY — DX: Blepharospasm: G24.5

## 2011-02-08 HISTORY — DX: Alcohol abuse, in remission: F10.11

## 2011-02-08 HISTORY — DX: Other abnormalities of gait and mobility: R26.89

## 2011-02-08 LAB — POCT I-STAT 4, (NA,K, GLUC, HGB,HCT)
Glucose, Bld: 94 mg/dL (ref 70–99)
HCT: 47 % — ABNORMAL HIGH (ref 36.0–46.0)
Hemoglobin: 16 g/dL — ABNORMAL HIGH (ref 12.0–15.0)
Potassium: 3.9 mEq/L (ref 3.5–5.1)

## 2011-02-08 SURGERY — CO2 LASER APPLICATION
Anesthesia: General | Site: Vagina | Wound class: Clean Contaminated

## 2011-02-08 MED ORDER — ESTROGENS, CONJUGATED 0.625 MG/GM VA CREA: 0.5000 g | TOPICAL_CREAM | Freq: Every day | VAGINAL | Status: DC

## 2011-02-08 MED ORDER — CEPHALEXIN 500 MG PO CAPS: 500.0000 mg | ORAL_CAPSULE | Freq: Four times a day (QID) | ORAL | Status: AC

## 2011-02-08 MED ORDER — FENTANYL CITRATE 0.05 MG/ML IJ SOLN: 25.0000 ug | INTRAMUSCULAR | Status: DC | PRN

## 2011-02-08 MED ORDER — ONDANSETRON HCL 4 MG/2ML IJ SOLN
INTRAMUSCULAR | Status: DC | PRN
  Administered 2011-02-08: 4 mg via INTRAVENOUS

## 2011-02-08 MED ORDER — METOCLOPRAMIDE HCL 5 MG/ML IJ SOLN
INTRAMUSCULAR | Status: DC | PRN
  Administered 2011-02-08: 10 mg via INTRAVENOUS

## 2011-02-08 MED ORDER — PROMETHAZINE HCL 25 MG/ML IJ SOLN: 6.2500 mg | INTRAMUSCULAR | Status: DC | PRN

## 2011-02-08 MED ORDER — LACTATED RINGERS IV SOLN
INTRAVENOUS | Status: DC | PRN
  Administered 2011-02-08 (×2): via INTRAVENOUS

## 2011-02-08 MED ORDER — DEXAMETHASONE SODIUM PHOSPHATE 4 MG/ML IJ SOLN
INTRAMUSCULAR | Status: DC | PRN
  Administered 2011-02-08: 4 mg via INTRAVENOUS

## 2011-02-08 MED ORDER — STERILE WATER FOR IRRIGATION IR SOLN
Status: DC | PRN
  Administered 2011-02-08: 1000 mL

## 2011-02-08 MED ORDER — FENTANYL CITRATE 0.05 MG/ML IJ SOLN
INTRAMUSCULAR | Status: DC | PRN
  Administered 2011-02-08: 50 ug via INTRAVENOUS
  Administered 2011-02-08 (×2): 25 ug via INTRAVENOUS

## 2011-02-08 MED ORDER — LABETALOL HCL 5 MG/ML IV SOLN
INTRAVENOUS | Status: DC | PRN
  Administered 2011-02-08: 5 mg via INTRAVENOUS
  Administered 2011-02-08: 2.5 mg via INTRAVENOUS
  Administered 2011-02-08: 5 mg via INTRAVENOUS

## 2011-02-08 MED ORDER — LACTATED RINGERS IV SOLN
INTRAVENOUS | Status: DC
  Administered 2011-02-08: 07:00:00 via INTRAVENOUS

## 2011-02-08 MED ORDER — ESTRADIOL 0.1 MG/GM VA CREA
TOPICAL_CREAM | VAGINAL | Status: DC | PRN
  Administered 2011-02-08: 1 via VAGINAL

## 2011-02-08 MED ORDER — PROPOFOL 10 MG/ML IV EMUL
INTRAVENOUS | Status: DC | PRN
  Administered 2011-02-08: 150 mg via INTRAVENOUS

## 2011-02-08 MED ORDER — LIDOCAINE HCL (CARDIAC) 20 MG/ML IV SOLN
INTRAVENOUS | Status: DC | PRN
  Administered 2011-02-08: 60 mg via INTRAVENOUS

## 2011-02-08 MED ORDER — ACETIC ACID 5 % SOLN
Status: DC | PRN
  Administered 2011-02-08: 1 via TOPICAL

## 2011-02-08 SURGICAL SUPPLY — 52 items
APPLICATOR COTTON TIP 6IN STRL (MISCELLANEOUS) ×4 IMPLANT
BAG DRN ANRFLXCHMBR STRAP LEK (BAG)
BAG URINE DRAINAGE (UROLOGICAL SUPPLIES) IMPLANT
BAG URINE LEG 19OZ MD ST LTX (BAG) IMPLANT
BLADE SURG 15 STRL LF DISP TIS (BLADE) IMPLANT
BLADE SURG 15 STRL SS (BLADE)
CANISTER SUCTION 1200CC (MISCELLANEOUS) IMPLANT
CANISTER SUCTION 2500CC (MISCELLANEOUS) IMPLANT
CATH FOLEY 2WAY SLVR  5CC 16FR (CATHETERS)
CATH FOLEY 2WAY SLVR 5CC 16FR (CATHETERS) IMPLANT
CATH ROBINSON RED A/P 16FR (CATHETERS) ×1 IMPLANT
CLOTH BEACON ORANGE TIMEOUT ST (SAFETY) ×2 IMPLANT
COVER TABLE BACK 60X90 (DRAPES) ×2 IMPLANT
DEPRESSOR TONGUE BLADE STERILE (MISCELLANEOUS) ×2 IMPLANT
DRAPE LG THREE QUARTER DISP (DRAPES) IMPLANT
DRAPE UNDERBUTTOCKS STRL (DRAPE) ×2 IMPLANT
DRESSING TELFA 8X3 (GAUZE/BANDAGES/DRESSINGS) IMPLANT
ELECT BALL LEEP 3MM BLK (ELECTRODE) IMPLANT
ELECT REM PT RETURN 9FT ADLT (ELECTROSURGICAL) ×2
ELECTRODE REM PT RTRN 9FT ADLT (ELECTROSURGICAL) ×1 IMPLANT
GLOVE BIO SURGEON STRL SZ 6.5 (GLOVE) ×4 IMPLANT
GLOVE BIO SURGEON STRL SZ7.5 (GLOVE) ×2 IMPLANT
GLOVE BIOGEL PI IND STRL 7.0 (GLOVE) IMPLANT
GLOVE BIOGEL PI INDICATOR 7.0 (GLOVE) ×1
GLOVE ECLIPSE 6.5 STRL STRAW (GLOVE) ×1 IMPLANT
GLOVE INDICATOR 6.5 STRL GRN (GLOVE) ×1 IMPLANT
GLOVE INDICATOR 7.0 STRL GRN (GLOVE) ×2 IMPLANT
GLOVE SKINSENSE NS SZ7.0 (GLOVE) ×1
GLOVE SKINSENSE STRL SZ7.0 (GLOVE) IMPLANT
GOWN PREVENTION PLUS LG XLONG (DISPOSABLE) ×5 IMPLANT
LEGGING LITHOTOMY PAIR STRL (DRAPES) IMPLANT
NDL HYPO 25X1 1.5 SAFETY (NEEDLE) IMPLANT
NEEDLE HYPO 25X1 1.5 SAFETY (NEEDLE) IMPLANT
NS IRRIG 500ML POUR BTL (IV SOLUTION) IMPLANT
PACK BASIN DAY SURGERY FS (CUSTOM PROCEDURE TRAY) ×2 IMPLANT
PAD OB MATERNITY 4.3X12.25 (PERSONAL CARE ITEMS) ×2 IMPLANT
PAD PREP 24X48 CUFFED NSTRL (MISCELLANEOUS) ×2 IMPLANT
PENCIL BUTTON HOLSTER BLD 10FT (ELECTRODE) IMPLANT
SCOPETTES 8  STERILE (MISCELLANEOUS) ×2
SCOPETTES 8 STERILE (MISCELLANEOUS) ×2 IMPLANT
SUT VIC AB 2-0 SH 27 (SUTURE)
SUT VIC AB 2-0 SH 27XBRD (SUTURE) IMPLANT
SUT VIC AB 3-0 PS2 18 (SUTURE)
SUT VIC AB 3-0 PS2 18XBRD (SUTURE) IMPLANT
SYR CONTROL 10ML LL (SYRINGE) IMPLANT
TOWEL OR 17X24 6PK STRL BLUE (TOWEL DISPOSABLE) ×4 IMPLANT
TRAY DSU PREP LF (CUSTOM PROCEDURE TRAY) ×2 IMPLANT
TUBE CONNECTING 12X1/4 (SUCTIONS) ×2 IMPLANT
VACUUM HOSE 7/8X10 W/ WAND (MISCELLANEOUS) IMPLANT
VACUUM HOSE/TUBING 7/8INX6FT (MISCELLANEOUS) ×2 IMPLANT
WATER STERILE IRR 500ML POUR (IV SOLUTION) ×3 IMPLANT
YANKAUER SUCT BULB TIP NO VENT (SUCTIONS) ×2 IMPLANT

## 2011-02-08 NOTE — Interval H&P Note (Signed)
History and Physical Interval Note:  02/08/2011 7:18 AM  [name]  has presented today for surgery, with the diagnosis of VAIN  The various methods of treatment have been discussed with the patient and family. After consideration of risks, benefits and other options for treatment, the patient has consented to  Procedure(s): CO2 LASER APPLICATION as a surgical intervention .  The patients' history has been reviewed, patient examined, no change in status, stable for surgery.  I have reviewed the patients' chart and labs.  Questions were answered to the patient's satisfaction.     Cherity Blickenstaff A.

## 2011-02-08 NOTE — Op Note (Signed)
PATIENT: [name] DATE OF BIRTH: 09-Dec-1945 ENCOUNTER DATE:    Preop Diagnosis: VAIN 3  Postoperative Diagnosis: same.   Surgery: CO2 laser ablation of vaginal cuff  Surgeons:  Rejeana Brock A. Duard Brady, MD  Assistant: None  Anesthesia: General   Estimated blood loss: minimal  IVF: 1000 ml   Urine output: 300 ml   Complications: None   Pathology: None  Operative findings: Raised hyperkeratotic lesion on the vagina encompassing the entire vaginal cuff. This area was acetowhite changing. In total the area measured approximately 4 x 4 centimeters.   Procedure: The patient was identified in the preoperative holding area. Informed consent was signed on the chart. Patient was seen history was reviewed and exam was performed. H&P was created an updated.  The patient was then taken to the operating room and placed in the supine position with SCD hose on. General anesthesia was then induced without difficulty. She was then placed in the dorsolithotomy position.  The perineum was prepped with Betadine. A Foley catheter was inserted to the bladder under sterile conditions. A was then removed at that the bladder was completely drained. Timeout was performed to confirm the patient procedure antibiotic allergy status. Once we complete a first timeout second timeout was performed after the patient was draped with wet towels as this was a laser procedure.  The laser speculum with the appropriate coating was placed into the vagina. Large swabs with 3% acetic acid were placed into the vagina.  A well-demarcated acetowhite epithelial changes noted as stated above encompassing the entire vaginal cuff. The CO2 laser had previously been tested. On a watt of 7 continuous the areas were ablated without difficulty. A wet swab with normal saline was used to wipe away the area that was ablated with the laser.  Acetic acid was then applied. There are no other acetowhite epithelial changes. At this point there was  adequate hemostasis. The upper vagina was coated with Premarin cream.   All instrument and [employer] counts were correct x2. The patient tolerated the procedure well and was taken to the recovery room in stable condition. This is [name] dictating an operative note on patient [name].

## 2011-02-08 NOTE — Anesthesia Procedure Notes (Signed)
Procedure Name: LMA Insertion Date/Time: 02/08/2011 7:30 AM Performed by: Iline Oven Pre-anesthesia Checklist: Patient identified, Emergency Drugs available, Suction available and Patient being monitored Patient Re-evaluated:Patient Re-evaluated prior to inductionOxygen Delivery Method: Circle System Utilized Preoxygenation: Pre-oxygenation with 100% oxygen Intubation Type: IV induction Ventilation: Mask ventilation without difficulty LMA: LMA with gastric port inserted LMA Size: 4.0 Number of attempts: 1 Placement Confirmation: positive ETCO2 Tube secured with: Tape Dental Injury: Teeth and Oropharynx as per pre-operative assessment

## 2011-02-08 NOTE — Transfer of Care (Signed)
Immediate Anesthesia Transfer of Care Note  Patient: [name]  Procedure(s) Performed:  CO2 LASER APPLICATION - laser vaporzation of vagina OK PER ANESTHESIA AND KEELA FOR 0715 START  Patient Location: PACU  Anesthesia Type: General  Level of Consciousness: awake, sedated, patient cooperative and responds to stimulation  Airway & Oxygen Therapy: Patient Spontanous Breathing and Patient connected to face mask oxygen  Post-op Assessment: Report given to PACU RN, Post -op Vital signs reviewed and stable and Patient moving all extremities  Post vital signs: Reviewed and stable  Complications: No apparent anesthesia complications

## 2011-02-08 NOTE — Anesthesia Postprocedure Evaluation (Signed)
  Anesthesia Post-op Note  Patient: [name]  Procedure(s) Performed:  CO2 LASER APPLICATION - laser vaporzation of vagina OK PER ANESTHESIA AND KEELA FOR 0715 START  Patient Location: PACU  Anesthesia Type: General  Level of Consciousness: awake and alert   Airway and Oxygen Therapy: Patient Spontanous Breathing  Post-op Pain: mild  Post-op Assessment: Post-op Vital signs reviewed, Patient's Cardiovascular Status Stable, Respiratory Function Stable, Patent Airway and No signs of Nausea or vomiting  Post-op Vital Signs: stable; BP improved. Patient to take hydrodiuril at home.  Complications: No apparent anesthesia complications

## 2011-02-08 NOTE — Anesthesia Preprocedure Evaluation (Addendum)
Anesthesia Evaluation  Patient identified by MRN, date of birth, ID band Patient awake  General Assessment Comment:H/O postop delirium.  Reviewed: Allergy & Precautions, H&P , NPO status , Patient's Chart, lab work & pertinent test results  History of Anesthesia Complications (+) PONV  Airway Mallampati: II TM Distance: >3 FB Neck ROM: Full    Dental No notable dental hx.    Pulmonary former smoker clear to auscultation  Pulmonary exam normal       Cardiovascular hypertension, Pt. on medications + CAD and + Past MI Regular Normal Denies chest pains. S/P stent in 2000. Quit smoking 3 weeks ago. Nicoderm on. Facial nerve palsy. EKG with SB 45, anterior MI evidence.   Neuro/Psych PSYCHIATRIC DISORDERS Anxiety Depression  Neuromuscular disease    GI/Hepatic negative GI ROS, Neg liver ROS, H/o alcoholism, bipolar d/o, facial nerve palsy.   Endo/Other  Negative Endocrine ROS  Renal/GU negative Renal ROS  Genitourinary negative   Musculoskeletal negative musculoskeletal ROS (+)   Abdominal   Peds negative pediatric ROS (+)  Hematology negative hematology ROS (+)   Anesthesia Other Findings   Reproductive/Obstetrics negative OB ROS                          Anesthesia Physical Anesthesia Plan  ASA: III  Anesthesia Plan: General   Post-op Pain Management:    Induction: Intravenous  Airway Management Planned: LMA  Additional Equipment:   Intra-op Plan:   Post-operative Plan: Extubation in OR  Informed Consent: I have reviewed the patients History and Physical, chart, labs and discussed the procedure including the risks, benefits and alternatives for the proposed anesthesia with the patient or authorized representative who has indicated his/her understanding and acceptance.   Dental advisory given  Plan Discussed with: CRNA  Anesthesia Plan Comments:         Anesthesia Quick  Evaluation

## 2011-02-08 NOTE — H&P (Signed)
HPI: [name] is a very pleasant 66 year old with a 10 year history of abnormal Pap smears. In April 2011 she had a Pap smear revealed atypical squamous cells cannot exclude a high-grade dysplasia. She was worked up and evaluated and subsequently underwent a vaginal hysterectomy in June of 2011. Pathology at that time revealed the cervix with high-grade squamous dysplasia. Focal adenomyosis. In addition, the ectocervical margin of the cervix was positive. She was seen by Dr. Arlyce Dice on October 29 at which time a Pap smear revealed VAIN2/VAIN 3. She comes in today for evaluation of the above. As stated she had an abnormal Pap smear history for the past 10 years. Since her surgery she occasionally complains of some left sided groin pain which has been evaluated to discuss by Dr. Arlyce Dice he does not believe that it is a significant consequence at this time. The pain is intermittent does not require pain medication this is fleeting and does not wake her up at night. It is not appear to be related to bowel or bladder habits. She states that at the time of her vaginal hysterectomy she wanted to keep her ovaries she is now status she requests that decision. I discussed with her that persistent vaginal dysplasia was not related at all to her decision to keep her ovaries.  Interval History: As above.  Review of Systems: She denies any chest pains or shortness of breath. She suffers from blepharospasm and receives Botox injections for this. She denies any other abdominal pain. She denies any change in her bowel or bladder habits vaginal bleeding vaginal discharge. 10 point review of systems is otherwise negative.  Current Meds:  Outpatient Encounter Prescriptions as of 12/14/2010   Medication  Sig  Dispense  Refill   .  ARIPiprazole (ABILIFY) 15 MG tablet  Take 15 mg by mouth daily.     .  calcium-vitamin D (OSCAL WITH D) 500-200 MG-UNIT per tablet  Take 1 tablet by mouth daily.     .  cholecalciferol (VITAMIN D)  1000 UNITS tablet  Take 2,000 Units by mouth daily.     .  clonazePAM (KLONOPIN) 1 MG tablet  Take 1 mg by mouth 3 (three) times daily.     .  cloNIDine (CATAPRES) 0.2 MG tablet  Take 0.2 mg by mouth 2 (two) times daily.     .  hydrochlorothiazide (HYDRODIURIL) 25 MG tablet  Take 25 mg by mouth daily.     Marland Kitchen  lamoTRIgine (LAMICTAL) 100 MG tablet  Take 200 mg by mouth daily.     .  metroNIDAZOLE (METROCREAM) 0.75 % cream  Apply topically 2 (two) times daily.     .  Multiple Vitamin (MULTIVITAMIN) capsule  Take 1 capsule by mouth daily.     Marland Kitchen  nystatin cream (MYCOSTATIN)  Apply topically 2 (two) times daily.     .  quinapril (ACCUPRIL) 40 MG tablet  Take 40 mg by mouth at bedtime.     .  sertraline (ZOLOFT) 50 MG tablet  Take 50 mg by mouth daily.     .  simvastatin (ZOCOR) 10 MG tablet  Take 10 mg by mouth at bedtime.     .  triamcinolone cream (KENALOG) 0.1 %  Apply topically 2 (two) times daily.     Allergy: No Known Allergies  Social Hx:  History    Social History   .  Marital Status:  Divorced     Spouse Name:  N/A     Number of  Children:  N/A   .  Years of Education:  N/A    Occupational History   .  Not on file.    Social History Main Topics   .  Smoking status:  Quit 1/13    Types:    .  Smokeless tobacco:  Not on file   .  Alcohol Use:  No   .  Drug Use:  No   .  Sexually Active:  No    Other Topics  Concern   .  Not on file    Social History Narrative   .  No narrative on file   Past Surgical Hx:  Past Surgical History   Procedure  Date   .  Abdominal hysterectomy    .  Total hip arthroplasty  2010     both sides   .  Total knee arthroplasty  2011     Right knee   .  Cartoid artery    Past Medical Hx:  Past Medical History   Diagnosis  Date   .  Hypertension    .  Arthritis    .  Myocardial infarction    .  Depression    .  Alcoholism      recovering since 2000   Family Hx:  Family History   Problem  Relation  Age of Onset   .  Hypertension  Mother      .  Hypertension  Father    Vitals: Blood pressure 118/64, pulse 64, temperature 98.1 F (36.7 C), temperature source Oral, resp. rate 16, height 5\' 7"  (1.702 m), weight 146 lb 3.2 oz (66.316 kg).  Physical Exam:  Well-nourished well-developed female in no acute distress.  External genitalia within normal limits. The vagina is markedly atrophic. There is a filmy discharge noted at the top of the vagina. It was wiped away in an erythematous lesion was noted at the top of the vagina. Proctoscopy was performed after the application of acetic acid. The entire upper vagina is cover with an acetowhite epithelial change. After obtaining the patient's verbal consent a biopsy was performed. Silver nitrate was used for hemostasis.  Assessment/Plan: 66 year old with multiple medical problems who has a long history of cervical and now vaginal dysplasia. She had a lengthy discussion regarding smoking cessation and the impact of smoking has a persistent dysplasia. It would be beneficial for her to quit smoking with regards to this as well as her cardiac disease and she is known to have a cardiac stent after having a myocardial infarction in 2000. We had a discussion regarding options including using 5-FU to the upper vagina followed by topical estrogen for the next 3 months to see if it would resolve the lesion. We also discussed CO2 ablation in the operating room. After our discussion she wishes to proceed with CO2 ablation of the upper vagina. I believe this can be done under conscious sedation and local anesthesia. Risks and benefits of the procedure were discussed with the patient and her questions were answered to her satisfaction.

## 2011-02-10 ENCOUNTER — Telehealth: Payer: Self-pay | Admitting: Gynecologic Oncology

## 2011-02-10 NOTE — Telephone Encounter (Signed)
Post op telephone call to check patient status.  Patient describes expected post operative status.  Adequate PO intake reported.  Bowels and bladder functioning without difficulty.  Pain minimal.  Reportable signs and symptoms reviewed.  Follow up appt given.   

## 2011-02-21 DIAGNOSIS — L719 Rosacea, unspecified: Secondary | ICD-10-CM | POA: Diagnosis not present

## 2011-02-21 DIAGNOSIS — L219 Seborrheic dermatitis, unspecified: Secondary | ICD-10-CM | POA: Diagnosis not present

## 2011-02-28 ENCOUNTER — Encounter: Payer: Self-pay | Admitting: Cardiology

## 2011-02-28 ENCOUNTER — Ambulatory Visit
Admission: RE | Admit: 2011-02-28 | Discharge: 2011-02-28 | Disposition: A | Discharge status: Home / Self Care | Payer: Medicare Other | Source: Ambulatory Visit | Attending: Family Medicine | Admitting: Family Medicine

## 2011-02-28 ENCOUNTER — Ambulatory Visit (INDEPENDENT_AMBULATORY_CARE_PROVIDER_SITE_OTHER): Payer: Medicare Other | Admitting: Cardiology

## 2011-02-28 DIAGNOSIS — Z1231 Encounter for screening mammogram for malignant neoplasm of breast: Secondary | ICD-10-CM | POA: Diagnosis not present

## 2011-02-28 DIAGNOSIS — R079 Chest pain, unspecified: Secondary | ICD-10-CM | POA: Diagnosis not present

## 2011-02-28 DIAGNOSIS — I10 Essential (primary) hypertension: Secondary | ICD-10-CM | POA: Diagnosis not present

## 2011-02-28 DIAGNOSIS — I739 Peripheral vascular disease, unspecified: Secondary | ICD-10-CM

## 2011-02-28 DIAGNOSIS — R0989 Other specified symptoms and signs involving the circulatory and respiratory systems: Secondary | ICD-10-CM | POA: Diagnosis not present

## 2011-02-28 DIAGNOSIS — I2119 ST elevation (STEMI) myocardial infarction involving other coronary artery of inferior wall: Secondary | ICD-10-CM

## 2011-02-28 DIAGNOSIS — I214 Non-ST elevation (NSTEMI) myocardial infarction: Secondary | ICD-10-CM | POA: Insufficient documentation

## 2011-02-28 DIAGNOSIS — I2510 Atherosclerotic heart disease of native coronary artery without angina pectoris: Secondary | ICD-10-CM

## 2011-02-28 NOTE — Progress Notes (Signed)
[name] Date of Birth: Feb 21, 1945 Medical Record #[id number]  History of Present Illness: [name] is seen today for followup evaluation. She was last seen in August of 2011. She has a history of coronary disease and is status post inferior myocardial infarction in 2000 treated with emergent stenting of the right coronary. Her last cardiac evaluation in March of 2010 with an adenosine Cardiolite study was normal with an ejection fraction of 78%. Since her last visit here she underwent total knee replacement. She quit smoking in January of this year. She recently underwent laser treatment for uterine bleeding. She occasionally complains of some mild chest pain in the left and right parasternal regions. This mostly when she is just sitting. It is not related to activity or meals. Recently her clonidine dose was increased for treatment of hypertension.   Current Outpatient Prescriptions on File Prior to Visit  Medication Sig Dispense Refill  . ARIPiprazole (ABILIFY) 15 MG tablet Take 15 mg by mouth daily.       . calcium-vitamin D (OSCAL WITH D) 500-200 MG-UNIT per tablet Take 1 tablet by mouth daily.       . cholecalciferol (VITAMIN D) 1000 UNITS tablet Take 2,000 Units by mouth daily.       . clonazePAM (KLONOPIN) 1 MG tablet Take 1 mg by mouth 3 (three) times daily.       . cloNIDine (CATAPRES) 0.2 MG tablet Take 0.2 mg by mouth 2 (two) times daily.       . hydrochlorothiazide (HYDRODIURIL) 25 MG tablet Take 25 mg by mouth daily.       Marland Kitchen HYDROcodone-acetaminophen (NORCO) 10-325 MG per tablet Take 1 tablet by mouth every 6 (six) hours as needed.      . lamoTRIgine (LAMICTAL) 100 MG tablet Take 100 mg by mouth 2 (two) times daily.       . metroNIDAZOLE (METROCREAM) 0.75 % cream Apply topically daily.       . Multiple Vitamin (MULTIVITAMIN) capsule Take 1 capsule by mouth daily.       . nicotine (NICODERM CQ - DOSED IN MG/24 HOURS) 21 mg/24hr patch Place 1 patch onto the skin daily.       Marland Kitchen nystatin cream (MYCOSTATIN) Apply topically as needed.       . quinapril (ACCUPRIL) 40 MG tablet Take 40 mg by mouth at bedtime.       . sertraline (ZOLOFT) 50 MG tablet Take 50 mg by mouth daily.       . simvastatin (ZOCOR) 10 MG tablet Take 10 mg by mouth at bedtime.       . triamcinolone cream (KENALOG) 0.1 % Apply topically 2 (two) times daily.       Marland Kitchen conjugated estrogens (PREMARIN) vaginal cream Place 0.5 Applicatorfuls vaginally daily.  42.5 g  1    No Known Allergies  Past Medical History  Diagnosis Date  . Hypertension   . Depression   . Alcoholism     recovering since 2000  . PONV (postoperative nausea and vomiting)   . Other and unspecified general anesthetics causing adverse effect in therapeutic use post op delirium--  last anes record w/ chart  from   09-22-2009 (spinal w/ light sedation)  . Coronary artery disease CARDIOLOGIST- DR [place]--- LAST VISIT NOTE 09-07-2009  W/ CHART  . Inferior MI 2000--  POST PTCA W/ STENT X1  . Bipolar disorder   . Peripheral vascular disease POST RIGHT CAROTID SURG.  1995  . Status post carotid  endarterectomy RIGHT --  1995  . Status post primary angioplasty with coronary stent 2000--  POST INFERIOR MI  . Blood transfusion   . Normal cardiac stress test 2008-- PER DR [place] NOTE 09-07-2009  . Arthritis BACK  . Chronic back pain   . Osteoporosis   . Rosacea LEFT FACIAL RASH  . Idiopathic acute facial nerve palsy LEFT SIDE--  BOTOX THERAPY  . Blepharospasm LEFT EYE  . History of alcohol abuse RECOVERING SINCE 2000  . Anxiety   . Unstable balance WALKS W/ CANE    Past Surgical History  Procedure Date  . Total knee arthroplasty 09-22-2009    RIGHT  . Vaginal hysterectomy 07-06-2009  . Hemiarthroplasty hip 12-26-2008    LEFT FEMORAL NECK FX  . Total hip arthroplasty 04-15-2008    POST FAILED  RIGHT HIP ORIF FEMORAL FX  . Cervical conization w/bx 09-23-2008  . Orif hip fracture 02-13-2007    RIGHT FEMORAL NECK FX  . Right  shoulder surg. 2007  . Coronary angioplasty with stent placement 2000-   INFERIOR MI    X1 STENT TO RCA  . Carotid endarterectomy 1995    RIGHT  . Cataract extraction w/ intraocular lens  implant, bilateral   . Orif right distal radius and right proximal humerous neck fx's 10-10-2005    History  Smoking status  . Former Smoker -- 2.0 packs/day for 50 years  . Types: Cigarettes  . Quit date: 01/18/2011  Smokeless tobacco  . Never Used  Comment: STATES QUIT SMOKING 01-18-2011    History  Alcohol Use No    RECOVERING ALCOHOLIC--   QUIT IN 2000    Family History  Problem Relation Age of Onset  . Hypertension Mother   . Hypertension Father     Review of Systems: The review of systems is positive for chronic back pain.  She reports getting steroid injections about every 4-6 months. She is on chronic Vicodin. All other systems were reviewed and are negative.  Physical Exam: BP 128/76  Pulse 56  Ht 5\' 6"  (1.676 m)  Wt 150 lb 12.8 oz (68.402 kg)  BMI 24.34 kg/m2 She is a chronically ill appearing white female in no acute distress. HEENT exam is unremarkable. She has no JVD . She has an old carotid endarterectomy scar on the right with a loud bruit. She has a chronic muscle twitch in her left face. Lungs are clear. Cardiac exam reveals a regular rate and rhythm without gallop, murmur, or click. Abdomen is soft and nontender. She has no masses or bruits. Femoral and pedal pulses are 2+ and symmetric. She has no edema. Skin is warm and dry. She is alert and oriented x3. Cranial nerves II through XII are intact.   LABORATORY DATA: ECG was reviewed recently from the hospital. It shows sinus bradycardia with possible old septal infarction. There were no acute changes.  Assessment / Plan:

## 2011-02-28 NOTE — Assessment & Plan Note (Signed)
She is status post remote carotid endarterectomy. She has a loud bruit on the right. We will assess with carotid Doppler study.

## 2011-02-28 NOTE — Assessment & Plan Note (Signed)
Blood pressure is well controlled on her current medication. I would continue with her current treatment.

## 2011-02-28 NOTE — Assessment & Plan Note (Signed)
She has mild atypical chest pain symptoms that I believe are noncardiac. However it has been several years since her last cardiac evaluation. We'll schedule her back for a [employer] study.

## 2011-02-28 NOTE — Patient Instructions (Signed)
We will schedule you for a nuclear stress test and carotid doppler studies.  Continue your current medications.

## 2011-03-02 ENCOUNTER — Encounter: Payer: Self-pay | Admitting: *Deleted

## 2011-03-07 ENCOUNTER — Telehealth: Payer: Self-pay | Admitting: Cardiology

## 2011-03-07 NOTE — Telephone Encounter (Signed)
03/07/11--pt had ? About eating and putting on lotion for testing--all ? Answered--nt

## 2011-03-07 NOTE — Telephone Encounter (Signed)
New msg Pt is having lexiscan on 0219 and doppler on 0225. She has some questions. Please call her back

## 2011-03-08 ENCOUNTER — Ambulatory Visit (HOSPITAL_COMMUNITY): Payer: Medicare Other | Attending: Cardiology | Admitting: Radiology

## 2011-03-08 DIAGNOSIS — I779 Disorder of arteries and arterioles, unspecified: Secondary | ICD-10-CM | POA: Diagnosis not present

## 2011-03-08 DIAGNOSIS — R0609 Other forms of dyspnea: Secondary | ICD-10-CM | POA: Diagnosis not present

## 2011-03-08 DIAGNOSIS — I739 Peripheral vascular disease, unspecified: Secondary | ICD-10-CM | POA: Insufficient documentation

## 2011-03-08 DIAGNOSIS — R0989 Other specified symptoms and signs involving the circulatory and respiratory systems: Secondary | ICD-10-CM | POA: Insufficient documentation

## 2011-03-08 DIAGNOSIS — R0602 Shortness of breath: Secondary | ICD-10-CM

## 2011-03-08 DIAGNOSIS — I2510 Atherosclerotic heart disease of native coronary artery without angina pectoris: Secondary | ICD-10-CM | POA: Diagnosis not present

## 2011-03-08 DIAGNOSIS — R079 Chest pain, unspecified: Secondary | ICD-10-CM | POA: Diagnosis not present

## 2011-03-08 DIAGNOSIS — I10 Essential (primary) hypertension: Secondary | ICD-10-CM | POA: Insufficient documentation

## 2011-03-08 DIAGNOSIS — R002 Palpitations: Secondary | ICD-10-CM | POA: Insufficient documentation

## 2011-03-08 DIAGNOSIS — E785 Hyperlipidemia, unspecified: Secondary | ICD-10-CM | POA: Insufficient documentation

## 2011-03-08 DIAGNOSIS — Z9861 Coronary angioplasty status: Secondary | ICD-10-CM | POA: Diagnosis not present

## 2011-03-08 DIAGNOSIS — J449 Chronic obstructive pulmonary disease, unspecified: Secondary | ICD-10-CM | POA: Insufficient documentation

## 2011-03-08 DIAGNOSIS — Z87891 Personal history of nicotine dependence: Secondary | ICD-10-CM | POA: Diagnosis not present

## 2011-03-08 DIAGNOSIS — J4489 Other specified chronic obstructive pulmonary disease: Secondary | ICD-10-CM | POA: Insufficient documentation

## 2011-03-08 MED ORDER — TECHNETIUM TC 99M TETROFOSMIN IV KIT
30.0000 | PACK | Freq: Once | INTRAVENOUS | Status: AC | PRN
  Administered 2011-03-08: 30 via INTRAVENOUS

## 2011-03-08 MED ORDER — REGADENOSON 0.4 MG/5ML IV SOLN
0.4000 mg | Freq: Once | INTRAVENOUS | Status: AC
  Administered 2011-03-08: 0.4 mg via INTRAVENOUS

## 2011-03-08 MED ORDER — TECHNETIUM TC 99M TETROFOSMIN IV KIT
10.0000 | PACK | Freq: Once | INTRAVENOUS | Status: AC | PRN
  Administered 2011-03-08: 10 via INTRAVENOUS

## 2011-03-08 NOTE — Progress Notes (Signed)
[facility] SITE 3 NUCLEAR MED [address] [place] [address] [phone]  Cardiology Nuclear Med Study  [name] is a 66 y.o. female [id number] 09/09/1945   Nuclear Med Background Indication for Stress Test:  Evaluation for Ischemia and PTCA/Stent Patency  History:  COPD and '95 CEA; '00 IWMI>PTCA/Stent-RCA; '10 OZH:YQMVHQ, EF=78% Cardiac Risk Factors: Carotid Disease, History of Smoking, Hypertension, Lipids and PVD  Symptoms:  Chest Pain (last date of chest discomfort was last week), DOE and Palpitations   Nuclear Pre-Procedure Caffeine/Decaff Intake:  None NPO After: 5:30am   Lungs:  Clear.  O2 SAT 94% on RA. IV 0.9% NS with Angio Cath:  22g  IV Site: R Wrist  IV Started by:  [name], EMT-P  Chest Size (in):  36 Cup Size: D  Height: 5\' 6"  (1.676 m)  Weight:  150 lb (68.04 kg)  BMI:  Body mass index is 24.21 kg/(m^2). Tech Comments:  AM medications taken    Nuclear Med Study 1 or 2 day study: 1 day  Stress Test Type:  Lexiscan  Reading MD: Jade Millers, MD  Order Authorizing Provider:  Peter [place], MD  Resting Radionuclide: Technetium 19m Tetrofosmin  Resting Radionuclide Dose: 10.7 mCi   Stress Radionuclide:  Technetium 22m Tetrofosmin  Stress Radionuclide Dose: 33.0 mCi           Stress Protocol Rest HR: 52 Stress HR: 74  Rest BP: 173/103 Stress BP: 168/91  Exercise Time (min): n/a METS: n/a   Predicted Max HR: 154 bpm % Max HR: 48.05 bpm Rate Pressure Product: 46962   Dose of Adenosine (mg):  n/a Dose of Lexiscan: 0.4 mg  Dose of Atropine (mg): n/a Dose of Dobutamine: n/a mcg/kg/min (at max HR)  Stress Test Technologist: Smiley Houseman, CMA-N  Nuclear Technologist:  Doyne Keel, CNMT     Rest Procedure:  Myocardial perfusion imaging was performed at rest 45 minutes following the intravenous administration of Technetium 58m Tetrofosmin.  Rest ECG: Sinus bradycardia.  Stress Procedure:  The patient received IV Lexiscan  0.4 mg over 15-seconds.  Technetium 79m Tetrofosmin injected at 30-seconds.  There were no significant changes with Lexiscan.  Quantitative spect images were obtained after a 45 minute delay. Stress ECG: No significant ST segment change suggestive of ischemia.  QPS Raw Data Images:  Acquisition technically good; normal left ventricular size; surface contamination noted on rest images. Stress Images:  There is decreased uptake in the apex. Rest Images:  There is decreased uptake in the apex. Subtraction (SDS):  No evidence of ischemia. Transient Ischemic Dilatation (Normal <1.22):  0.93 Lung/Heart Ratio (Normal <0.45):  0.31  Quantitative Gated Spect Images QGS EDV:  88 ml QGS ESV:  27 ml QGS cine images:  NL LV Function; NL Wall Motion QGS EF: 69%  Impression Exercise Capacity:  Lexiscan with no exercise. BP Response:  Normal blood pressure response. Clinical Symptoms:  No chest pain. ECG Impression:  No significant ST segment change suggestive of ischemia. Comparison with Prior Nuclear Study: No images to compare  Overall Impression:  Normal stress nuclear study with mild apical thinning but no ischemia.   [name]

## 2011-03-10 ENCOUNTER — Encounter: Payer: Self-pay | Admitting: Gynecologic Oncology

## 2011-03-10 ENCOUNTER — Ambulatory Visit: Payer: Medicare Other | Attending: Gynecologic Oncology | Admitting: Gynecologic Oncology

## 2011-03-10 VITALS — BP 120/72 | HR 64 | Temp 97.7°F | Resp 14 | Ht 66.0 in | Wt 152.3 lb

## 2011-03-10 DIAGNOSIS — I10 Essential (primary) hypertension: Secondary | ICD-10-CM | POA: Insufficient documentation

## 2011-03-10 DIAGNOSIS — G245 Blepharospasm: Secondary | ICD-10-CM | POA: Insufficient documentation

## 2011-03-10 DIAGNOSIS — Z90710 Acquired absence of both cervix and uterus: Secondary | ICD-10-CM | POA: Insufficient documentation

## 2011-03-10 DIAGNOSIS — Z9861 Coronary angioplasty status: Secondary | ICD-10-CM | POA: Insufficient documentation

## 2011-03-10 DIAGNOSIS — Z09 Encounter for follow-up examination after completed treatment for conditions other than malignant neoplasm: Secondary | ICD-10-CM | POA: Insufficient documentation

## 2011-03-10 DIAGNOSIS — I252 Old myocardial infarction: Secondary | ICD-10-CM | POA: Insufficient documentation

## 2011-03-10 DIAGNOSIS — F329 Major depressive disorder, single episode, unspecified: Secondary | ICD-10-CM | POA: Insufficient documentation

## 2011-03-10 DIAGNOSIS — Z87891 Personal history of nicotine dependence: Secondary | ICD-10-CM | POA: Insufficient documentation

## 2011-03-10 DIAGNOSIS — I2510 Atherosclerotic heart disease of native coronary artery without angina pectoris: Secondary | ICD-10-CM | POA: Insufficient documentation

## 2011-03-10 DIAGNOSIS — M810 Age-related osteoporosis without current pathological fracture: Secondary | ICD-10-CM | POA: Insufficient documentation

## 2011-03-10 DIAGNOSIS — G510 Bell's palsy: Secondary | ICD-10-CM | POA: Insufficient documentation

## 2011-03-10 DIAGNOSIS — Z79818 Long term (current) use of other agents affecting estrogen receptors and estrogen levels: Secondary | ICD-10-CM | POA: Insufficient documentation

## 2011-03-10 DIAGNOSIS — F3289 Other specified depressive episodes: Secondary | ICD-10-CM | POA: Insufficient documentation

## 2011-03-10 DIAGNOSIS — M129 Arthropathy, unspecified: Secondary | ICD-10-CM | POA: Insufficient documentation

## 2011-03-10 DIAGNOSIS — F1021 Alcohol dependence, in remission: Secondary | ICD-10-CM | POA: Insufficient documentation

## 2011-03-10 DIAGNOSIS — F411 Generalized anxiety disorder: Secondary | ICD-10-CM | POA: Insufficient documentation

## 2011-03-10 DIAGNOSIS — D072 Carcinoma in situ of vagina: Secondary | ICD-10-CM | POA: Insufficient documentation

## 2011-03-10 NOTE — Patient Instructions (Signed)
Used Premarin vaginal cream as directed.

## 2011-03-10 NOTE — Progress Notes (Signed)
Consult Note: Gyn-Onc  Karin Lieu Overholser 66 y.o. female  CC:  Chief Complaint  Patient presents with  . VAIN III    Follow up    HPI: HPI: Mrs. Buffalo is a very pleasant 8 year old with a 10 year history of abnormal Pap smears. In April 2011 she had a Pap smear revealed atypical squamous cells cannot exclude a high-grade dysplasia. She was worked up and evaluated and subsequently underwent a vaginal hysterectomy in June of 2011. Pathology at that time revealed the cervix with high-grade squamous dysplasia. Focal adenomyosis. In addition, the ectocervical margin of the cervix was positive. She was seen by Dr. Oscar La on October 29 at which time a Pap smear revealed VAIN2/VAIN 3. She comes in today for evaluation of the above. As stated she had an abnormal Pap smear history for the past 10 years. Since her surgery she occasionally complains of some left sided groin pain which has been evaluated to discuss by Dr. Arlyce Dice he does not believe that it is a significant consequence at this time. The pain is intermittent does not require pain medication this is fleeting and does not wake her up at night. It is not appear to be related to bowel or bladder habits. She states that at the time of her vaginal hysterectomy she wanted to keep her ovaries she is now status she requests that decision. I discussed with her that persistent vaginal dysplasia was not related at all to her decision to keep her ovaries.   Interval History:  After discussion of treatment options including laser of the vagina as well as topical Efudex treatment the patient opted to proceed with CO2 laser ablation of the vaginal cuff. She underwent this procedure on January 22. She comes in today for a postoperative check. She's done very well since the procedure and has no complaints. She did quit smoking in a subsequently gained 5 pounds. Review of Systems  Current Meds:  Outpatient Encounter Prescriptions as of 03/10/2011  Medication Sig  Dispense Refill  . ARIPiprazole (ABILIFY) 15 MG tablet Take 15 mg by mouth daily.       . calcium-vitamin D (OSCAL WITH D) 500-200 MG-UNIT per tablet Take 1 tablet by mouth daily.       . cholecalciferol (VITAMIN D) 1000 UNITS tablet Take 2,000 Units by mouth daily.       . clonazePAM (KLONOPIN) 1 MG tablet Take 1 mg by mouth 3 (three) times daily.       . cloNIDine (CATAPRES) 0.2 MG tablet Take 0.2 mg by mouth 2 (two) times daily.       Marland Kitchen conjugated estrogens (PREMARIN) vaginal cream Place 0.5 Applicatorfuls vaginally daily.  42.5 g  1  . hydrochlorothiazide (HYDRODIURIL) 25 MG tablet Take 25 mg by mouth daily.       Marland Kitchen HYDROcodone-acetaminophen (NORCO) 10-325 MG per tablet Take 1 tablet by mouth every 6 (six) hours as needed.      . lamoTRIgine (LAMICTAL) 100 MG tablet Take 100 mg by mouth 2 (two) times daily.       . metroNIDAZOLE (METROCREAM) 0.75 % cream Apply topically daily.       . Multiple Vitamin (MULTIVITAMIN) capsule Take 1 capsule by mouth daily.       . nicotine (NICODERM CQ - DOSED IN MG/24 HOURS) 21 mg/24hr patch Place 1 patch onto the skin daily.      Marland Kitchen nystatin cream (MYCOSTATIN) Apply topically as needed.       . quinapril (ACCUPRIL) 40  MG tablet Take 40 mg by mouth at bedtime.       . sertraline (ZOLOFT) 50 MG tablet Take 50 mg by mouth daily.       . simvastatin (ZOCOR) 10 MG tablet Take 10 mg by mouth at bedtime.       . triamcinolone cream (KENALOG) 0.1 % Apply topically 2 (two) times daily.         Allergy: No Known Allergies  Social Hx:   History   Social History  . Marital Status: Divorced    Spouse Name: N/A    Number of Children: N/A  . Years of Education: N/A   Occupational History  . Not on file.   Social History Main Topics  . Smoking status: Former Smoker -- 2.0 packs/day for 50 years    Types: Cigarettes    Quit date: 01/18/2011  . Smokeless tobacco: Never Used   Comment: STATES QUIT SMOKING 01-18-2011  . Alcohol Use: No     RECOVERING  ALCOHOLIC--   QUIT IN 2000  . Drug Use: No  . Sexually Active: No   Other Topics Concern  . Not on file   Social History Narrative  . No narrative on file    Past Surgical Hx:  Past Surgical History  Procedure Date  . Total knee arthroplasty 09-22-2009    RIGHT  . Vaginal hysterectomy 07-06-2009  . Hemiarthroplasty hip 12-26-2008    LEFT FEMORAL NECK FX  . Total hip arthroplasty 04-15-2008    POST FAILED  RIGHT HIP ORIF FEMORAL FX  . Cervical conization w/bx 09-23-2008  . Orif hip fracture 02-13-2007    RIGHT FEMORAL NECK FX  . Right shoulder surg. 2007  . Coronary angioplasty with stent placement 2000-   INFERIOR MI    X1 STENT TO RCA  . Carotid endarterectomy 1995    RIGHT  . Cataract extraction w/ intraocular lens  implant, bilateral   . Orif right distal radius and right proximal humerous neck fx's 10-10-2005    Past Medical Hx:  Past Medical History  Diagnosis Date  . Hypertension   . Depression   . Alcoholism     recovering since 2000  . PONV (postoperative nausea and vomiting)   . Other and unspecified general anesthetics causing adverse effect in therapeutic use post op delirium--  last anes record w/ chart  from   09-22-2009 (spinal w/ light sedation)  . Coronary artery disease CARDIOLOGIST- DR [place]--- LAST VISIT NOTE 09-07-2009  W/ CHART  . Inferior MI 2000--  POST PTCA W/ STENT X1  . Bipolar disorder   . Peripheral vascular disease POST RIGHT CAROTID SURG.  1995  . Status post carotid endarterectomy RIGHT --  1995  . Status post primary angioplasty with coronary stent 2000--  POST INFERIOR MI  . Blood transfusion   . Normal cardiac stress test 2008-- PER DR [place] NOTE 09-07-2009  . Arthritis BACK  . Chronic back pain   . Osteoporosis   . Rosacea LEFT FACIAL RASH  . Idiopathic acute facial nerve palsy LEFT SIDE--  BOTOX THERAPY  . Blepharospasm LEFT EYE  . History of alcohol abuse RECOVERING SINCE 2000  . Anxiety   . Unstable balance WALKS W/  CANE  . HTN (hypertension)     Family Hx:  Family History  Problem Relation Age of Onset  . Hypertension Mother   . Hypertension Father     Vitals:  Blood pressure 120/72, pulse 64, temperature 97.7 F (36.5 C), temperature source  Oral, resp. rate 14, height 5\' 6"  (1.676 m), weight 152 lb 4.8 oz (69.083 kg).  Physical Exam: Well-nourished well-developed female in no acute distress.  Pelvic: Normal external female genitalia. The vagina is atrophic. Evidence of Premarin vaginal cream in the vagina. The vaginal cuff is visualized is erythematous. It is healing well.  Assessment/Plan:  66 year old with VAIN 3. Plan she'll continue using her estrogen vaginal cream. Return to see me in 4 months for repeat Pap smear. Jubal Rademaker A., MD 03/10/2011, 12:10 PM

## 2011-03-14 ENCOUNTER — Encounter (INDEPENDENT_AMBULATORY_CARE_PROVIDER_SITE_OTHER): Payer: Medicare Other

## 2011-03-14 DIAGNOSIS — I10 Essential (primary) hypertension: Secondary | ICD-10-CM

## 2011-03-14 DIAGNOSIS — I6529 Occlusion and stenosis of unspecified carotid artery: Secondary | ICD-10-CM | POA: Diagnosis not present

## 2011-03-14 DIAGNOSIS — I2510 Atherosclerotic heart disease of native coronary artery without angina pectoris: Secondary | ICD-10-CM

## 2011-03-14 DIAGNOSIS — R0989 Other specified symptoms and signs involving the circulatory and respiratory systems: Secondary | ICD-10-CM

## 2011-03-16 ENCOUNTER — Ambulatory Visit: Payer: Medicare Other | Admitting: Cardiology

## 2011-03-17 ENCOUNTER — Telehealth: Payer: Self-pay | Admitting: Cardiology

## 2011-03-17 NOTE — Telephone Encounter (Signed)
Notes Recorded by Peter [place], MD on 03/16/2011 at 12:09 PM Moderate RICA stenosis. No significant disease on the left. Recommend repeat in 6 months. No indication for intervention at this point. Peter [place] MD, [facility]  03/17/11--talked with pt. She is aware of results and recommendations

## 2011-03-17 NOTE — Telephone Encounter (Signed)
New Msg: Pt calling wanting to know results of pt carotid. Please return pt call to discuss further.  

## 2011-03-18 ENCOUNTER — Other Ambulatory Visit: Payer: Self-pay

## 2011-03-18 DIAGNOSIS — R0989 Other specified symptoms and signs involving the circulatory and respiratory systems: Secondary | ICD-10-CM

## 2011-03-21 DIAGNOSIS — L719 Rosacea, unspecified: Secondary | ICD-10-CM | POA: Diagnosis not present

## 2011-03-23 DIAGNOSIS — F329 Major depressive disorder, single episode, unspecified: Secondary | ICD-10-CM | POA: Diagnosis not present

## 2011-03-23 DIAGNOSIS — E785 Hyperlipidemia, unspecified: Secondary | ICD-10-CM | POA: Diagnosis not present

## 2011-03-23 DIAGNOSIS — I10 Essential (primary) hypertension: Secondary | ICD-10-CM | POA: Diagnosis not present

## 2011-04-25 ENCOUNTER — Other Ambulatory Visit: Payer: Self-pay | Admitting: Family Medicine

## 2011-04-25 DIAGNOSIS — Z1211 Encounter for screening for malignant neoplasm of colon: Secondary | ICD-10-CM | POA: Diagnosis not present

## 2011-04-25 DIAGNOSIS — Z780 Asymptomatic menopausal state: Secondary | ICD-10-CM

## 2011-04-25 DIAGNOSIS — I10 Essential (primary) hypertension: Secondary | ICD-10-CM | POA: Diagnosis not present

## 2011-04-25 DIAGNOSIS — N893 Dysplasia of vagina, unspecified: Secondary | ICD-10-CM | POA: Diagnosis not present

## 2011-04-25 DIAGNOSIS — M8580 Other specified disorders of bone density and structure, unspecified site: Secondary | ICD-10-CM

## 2011-04-25 DIAGNOSIS — E559 Vitamin D deficiency, unspecified: Secondary | ICD-10-CM | POA: Diagnosis not present

## 2011-04-25 DIAGNOSIS — Z23 Encounter for immunization: Secondary | ICD-10-CM | POA: Diagnosis not present

## 2011-04-25 DIAGNOSIS — Z0000 Encounter for general adult medical examination without abnormal findings: Secondary | ICD-10-CM | POA: Diagnosis not present

## 2011-04-25 DIAGNOSIS — Z124 Encounter for screening for malignant neoplasm of cervix: Secondary | ICD-10-CM | POA: Diagnosis not present

## 2011-04-25 DIAGNOSIS — F329 Major depressive disorder, single episode, unspecified: Secondary | ICD-10-CM | POA: Diagnosis not present

## 2011-04-25 DIAGNOSIS — E785 Hyperlipidemia, unspecified: Secondary | ICD-10-CM | POA: Diagnosis not present

## 2011-04-25 DIAGNOSIS — Z79899 Other long term (current) drug therapy: Secondary | ICD-10-CM | POA: Diagnosis not present

## 2011-04-29 DIAGNOSIS — D485 Neoplasm of uncertain behavior of skin: Secondary | ICD-10-CM | POA: Diagnosis not present

## 2011-04-29 DIAGNOSIS — L219 Seborrheic dermatitis, unspecified: Secondary | ICD-10-CM | POA: Diagnosis not present

## 2011-05-03 DIAGNOSIS — G894 Chronic pain syndrome: Secondary | ICD-10-CM | POA: Diagnosis not present

## 2011-05-05 ENCOUNTER — Ambulatory Visit
Admission: RE | Admit: 2011-05-05 | Discharge: 2011-05-05 | Disposition: A | Discharge status: Home / Self Care | Payer: Medicare Other | Source: Ambulatory Visit | Attending: Family Medicine | Admitting: Family Medicine

## 2011-05-05 DIAGNOSIS — M8580 Other specified disorders of bone density and structure, unspecified site: Secondary | ICD-10-CM

## 2011-05-05 DIAGNOSIS — Z780 Asymptomatic menopausal state: Secondary | ICD-10-CM

## 2011-05-06 DIAGNOSIS — G245 Blepharospasm: Secondary | ICD-10-CM | POA: Diagnosis not present

## 2011-05-18 DIAGNOSIS — M545 Low back pain: Secondary | ICD-10-CM | POA: Diagnosis not present

## 2011-05-23 DIAGNOSIS — G245 Blepharospasm: Secondary | ICD-10-CM | POA: Diagnosis not present

## 2011-05-30 DIAGNOSIS — M810 Age-related osteoporosis without current pathological fracture: Secondary | ICD-10-CM | POA: Insufficient documentation

## 2011-05-30 DIAGNOSIS — I10 Essential (primary) hypertension: Secondary | ICD-10-CM | POA: Diagnosis not present

## 2011-06-06 DIAGNOSIS — G894 Chronic pain syndrome: Secondary | ICD-10-CM | POA: Diagnosis not present

## 2011-06-10 DIAGNOSIS — L219 Seborrheic dermatitis, unspecified: Secondary | ICD-10-CM | POA: Diagnosis not present

## 2011-06-10 DIAGNOSIS — L570 Actinic keratosis: Secondary | ICD-10-CM | POA: Diagnosis not present

## 2011-06-10 DIAGNOSIS — L820 Inflamed seborrheic keratosis: Secondary | ICD-10-CM | POA: Diagnosis not present

## 2011-06-22 ENCOUNTER — Other Ambulatory Visit (HOSPITAL_COMMUNITY)
Admission: RE | Admit: 2011-06-22 | Discharge: 2011-06-22 | Disposition: A | Discharge status: Home / Self Care | Payer: Medicare Other | Source: Ambulatory Visit | Attending: Gynecologic Oncology | Admitting: Gynecologic Oncology

## 2011-06-22 ENCOUNTER — Ambulatory Visit: Payer: Medicare Other | Attending: Gynecologic Oncology | Admitting: Gynecologic Oncology

## 2011-06-22 ENCOUNTER — Encounter: Payer: Self-pay | Admitting: Gynecologic Oncology

## 2011-06-22 VITALS — BP 118/62 | HR 60 | Temp 97.5°F | Resp 16 | Ht 67.0 in | Wt 159.6 lb

## 2011-06-22 DIAGNOSIS — F1021 Alcohol dependence, in remission: Secondary | ICD-10-CM | POA: Insufficient documentation

## 2011-06-22 DIAGNOSIS — I739 Peripheral vascular disease, unspecified: Secondary | ICD-10-CM | POA: Insufficient documentation

## 2011-06-22 DIAGNOSIS — N893 Dysplasia of vagina, unspecified: Secondary | ICD-10-CM | POA: Diagnosis not present

## 2011-06-22 DIAGNOSIS — F411 Generalized anxiety disorder: Secondary | ICD-10-CM | POA: Insufficient documentation

## 2011-06-22 DIAGNOSIS — N891 Moderate vaginal dysplasia: Secondary | ICD-10-CM

## 2011-06-22 DIAGNOSIS — Z124 Encounter for screening for malignant neoplasm of cervix: Secondary | ICD-10-CM | POA: Insufficient documentation

## 2011-06-22 DIAGNOSIS — F3289 Other specified depressive episodes: Secondary | ICD-10-CM | POA: Insufficient documentation

## 2011-06-22 DIAGNOSIS — M810 Age-related osteoporosis without current pathological fracture: Secondary | ICD-10-CM | POA: Insufficient documentation

## 2011-06-22 DIAGNOSIS — I2510 Atherosclerotic heart disease of native coronary artery without angina pectoris: Secondary | ICD-10-CM | POA: Insufficient documentation

## 2011-06-22 DIAGNOSIS — I252 Old myocardial infarction: Secondary | ICD-10-CM | POA: Insufficient documentation

## 2011-06-22 DIAGNOSIS — Z87891 Personal history of nicotine dependence: Secondary | ICD-10-CM | POA: Insufficient documentation

## 2011-06-22 DIAGNOSIS — F329 Major depressive disorder, single episode, unspecified: Secondary | ICD-10-CM | POA: Insufficient documentation

## 2011-06-22 DIAGNOSIS — I10 Essential (primary) hypertension: Secondary | ICD-10-CM | POA: Insufficient documentation

## 2011-06-22 DIAGNOSIS — Z9889 Other specified postprocedural states: Secondary | ICD-10-CM | POA: Insufficient documentation

## 2011-06-22 DIAGNOSIS — Z90710 Acquired absence of both cervix and uterus: Secondary | ICD-10-CM | POA: Insufficient documentation

## 2011-06-22 NOTE — Patient Instructions (Signed)
Continue using your premarin cream.

## 2011-06-22 NOTE — Progress Notes (Signed)
Consult Note: Gyn-Onc  Karin Lieu Beel 66 y.o. female  CC:  Chief Complaint  Patient presents with  . VAIN II    Follow up    HPI: Mrs. Teters is a very pleasant 89 year old with a 10 year history of abnormal Pap smears. In April 2011 she had a Pap smear revealed atypical squamous cells cannot exclude a high-grade dysplasia. She was worked up and evaluated and subsequently underwent a vaginal hysterectomy in June of 2011. Pathology at that time revealed the cervix with high-grade squamous dysplasia. Focal adenomyosis. In addition, the ectocervical margin of the cervix was positive. She was seen by Dr. Arlyce Dice on October 29 at which time a Pap smear revealed VAIN2/VAIN 3.  She underwent laser ablation on for dysplasia on January 22. Operative findings included a raised hyperkeratotic lesion in the vagina encompassing the entire vaginal cuff. The area was acetowhite changing. The total area measured approximately 4 x 4 centimeters. I saw her for posttreatment exam on February 21 first at which time she had healed quite well. She has been using her Premarin cream since that time.  Interval History:  She has had an area on her left ear that was frozen for presumed actinic keratoses.  Review of Systems: 10 point review of systems is negative  Current Meds:  Outpatient Encounter Prescriptions as of 06/22/2011  Medication Sig Dispense Refill  . ARIPiprazole (ABILIFY) 15 MG tablet Take 15 mg by mouth daily.       . calcium-vitamin D (OSCAL WITH D) 500-200 MG-UNIT per tablet Take 1 tablet by mouth daily.       . cholecalciferol (VITAMIN D) 1000 UNITS tablet Take 2,000 Units by mouth daily.       . clonazePAM (KLONOPIN) 1 MG tablet Take 1 mg by mouth 3 (three) times daily.       . cloNIDine (CATAPRES) 0.2 MG tablet Take 0.2 mg by mouth 2 (two) times daily.       Marland Kitchen conjugated estrogens (PREMARIN) vaginal cream Place 0.5 Applicatorfuls vaginally daily.  42.5 g  1  . hydrochlorothiazide  (HYDRODIURIL) 25 MG tablet Take 25 mg by mouth daily.       Marland Kitchen HYDROcodone-acetaminophen (NORCO) 10-325 MG per tablet Take 1 tablet by mouth every 6 (six) hours as needed.      . lamoTRIgine (LAMICTAL) 100 MG tablet Take 100 mg by mouth 2 (two) times daily.       . Multiple Vitamin (MULTIVITAMIN) capsule Take 1 capsule by mouth daily.       Marland Kitchen nystatin cream (MYCOSTATIN) Apply topically as needed.       . sertraline (ZOLOFT) 50 MG tablet Take 50 mg by mouth daily.       . simvastatin (ZOCOR) 10 MG tablet Take 10 mg by mouth at bedtime.       . Valsartan (DIOVAN PO) Take by mouth daily.      . metroNIDAZOLE (METROCREAM) 0.75 % cream Apply topically daily.       . nicotine (NICODERM CQ - DOSED IN MG/24 HOURS) 21 mg/24hr patch Place 1 patch onto the skin daily.      . quinapril (ACCUPRIL) 40 MG tablet Take 40 mg by mouth at bedtime.       . triamcinolone cream (KENALOG) 0.1 % Apply topically 2 (two) times daily.         Allergy: No Known Allergies  Social Hx:   History   Social History  . Marital Status: Divorced    Spouse  Name: N/A    Number of Children: N/A  . Years of Education: N/A   Occupational History  . Not on file.   Social History Main Topics  . Smoking status: Former Smoker -- 2.0 packs/day for 50 years    Types: Cigarettes    Quit date: 01/18/2011  . Smokeless tobacco: Never Used   Comment: STATES QUIT SMOKING 01-18-2011  . Alcohol Use: No     RECOVERING ALCOHOLIC--   QUIT IN 2000  . Drug Use: No  . Sexually Active: No   Other Topics Concern  . Not on file   Social History Narrative  . No narrative on file    Past Surgical Hx:  Past Surgical History  Procedure Date  . Total knee arthroplasty 09-22-2009    RIGHT  . Vaginal hysterectomy 07-06-2009  . Hemiarthroplasty hip 12-26-2008    LEFT FEMORAL NECK FX  . Total hip arthroplasty 04-15-2008    POST FAILED  RIGHT HIP ORIF FEMORAL FX  . Cervical conization w/bx 09-23-2008  . Orif hip fracture 02-13-2007     RIGHT FEMORAL NECK FX  . Right shoulder surg. 2007  . Coronary angioplasty with stent placement 2000-   INFERIOR MI    X1 STENT TO RCA  . Carotid endarterectomy 1995    RIGHT  . Cataract extraction w/ intraocular lens  implant, bilateral   . Orif right distal radius and right proximal humerous neck fx's 10-10-2005    Past Medical Hx:  Past Medical History  Diagnosis Date  . Hypertension   . Depression   . Alcoholism     recovering since 2000  . PONV (postoperative nausea and vomiting)   . Other and unspecified general anesthetics causing adverse effect in therapeutic use post op delirium--  last anes record w/ chart  from   09-22-2009 (spinal w/ light sedation)  . Coronary artery disease CARDIOLOGIST- DR [place]--- LAST VISIT NOTE 09-07-2009  W/ CHART  . Inferior MI 2000--  POST PTCA W/ STENT X1  . Bipolar disorder   . Peripheral vascular disease POST RIGHT CAROTID SURG.  1995  . Status post carotid endarterectomy RIGHT --  1995  . Status post primary angioplasty with coronary stent 2000--  POST INFERIOR MI  . Blood transfusion   . Normal cardiac stress test 2008-- PER DR [place] NOTE 09-07-2009  . Arthritis BACK  . Chronic back pain   . Osteoporosis   . Rosacea LEFT FACIAL RASH  . Idiopathic acute facial nerve palsy LEFT SIDE--  BOTOX THERAPY  . Blepharospasm LEFT EYE  . History of alcohol abuse RECOVERING SINCE 2000  . Anxiety   . Unstable balance WALKS W/ CANE  . HTN (hypertension)     Family Hx:  Family History  Problem Relation Age of Onset  . Hypertension Mother   . Hypertension Father     Vitals:  Blood pressure 118/62, pulse 60, temperature 97.5 F (36.4 C), temperature source Oral, resp. rate 16, height 5\' 7"  (1.702 m), weight 159 lb 9.6 oz (72.394 kg).  Physical Exam: Well-nourished well-developed female in no acute distress.  Pelvic: Normal external female genitalia. The vagina is atrophic. There is evidence of Premarin cream at the apex of the vagina.  Pap smear was submitted without difficulty. Assessment/Plan: 66 year old with history of VAIN 2/VAIN-III. She is status post laser ablation. We'll followup in results of the Pap smear notify her of the results. Until the Pap smear results are available she will continue using her Premarin cream.  Valiant Dills A., MD 06/22/2011, 2:57 PM

## 2011-06-28 ENCOUNTER — Ambulatory Visit (INDEPENDENT_AMBULATORY_CARE_PROVIDER_SITE_OTHER): Payer: Medicare Other | Admitting: Cardiology

## 2011-06-28 ENCOUNTER — Encounter: Payer: Self-pay | Admitting: Cardiology

## 2011-06-28 VITALS — BP 123/87 | HR 49 | Ht 66.0 in | Wt 159.8 lb

## 2011-06-28 DIAGNOSIS — I2510 Atherosclerotic heart disease of native coronary artery without angina pectoris: Secondary | ICD-10-CM

## 2011-06-28 DIAGNOSIS — I779 Disorder of arteries and arterioles, unspecified: Secondary | ICD-10-CM

## 2011-06-28 DIAGNOSIS — I10 Essential (primary) hypertension: Secondary | ICD-10-CM

## 2011-06-28 DIAGNOSIS — E785 Hyperlipidemia, unspecified: Secondary | ICD-10-CM | POA: Diagnosis not present

## 2011-06-28 NOTE — Progress Notes (Signed)
Alphonsus Sias Date of Birth: August 08, 1945 Medical Record #[id number]  History of Present Illness: [name] is seen today for followup. Since her last visit she continues to do well. She reports her blood pressure has been under control. She has not resumed smoking. She denies any significant chest pain, dyspnea, or edema. She's had no TIA or CVA symptoms.  Current Outpatient Prescriptions on File Prior to Visit  Medication Sig Dispense Refill  . ARIPiprazole (ABILIFY) 15 MG tablet Take 15 mg by mouth daily.       . calcium-vitamin D (OSCAL WITH D) 500-200 MG-UNIT per tablet Take 1 tablet by mouth daily.       . cholecalciferol (VITAMIN D) 1000 UNITS tablet Take 2,000 Units by mouth daily. Vitamin d3      . clonazePAM (KLONOPIN) 1 MG tablet Take 1 mg by mouth 3 (three) times daily.       . cloNIDine (CATAPRES) 0.2 MG tablet Take 0.2 mg by mouth 2 (two) times daily.       Marland Kitchen lamoTRIgine (LAMICTAL) 100 MG tablet Take 100 mg by mouth 2 (two) times daily.       . metroNIDAZOLE (METROCREAM) 0.75 % cream Apply topically daily.       . Multiple Vitamin (MULTIVITAMIN) capsule Take 1 capsule by mouth daily.       . sertraline (ZOLOFT) 50 MG tablet Take 50 mg by mouth daily.       . simvastatin (ZOCOR) 10 MG tablet Take 10 mg by mouth at bedtime.       . Valsartan (DIOVAN PO) Take by mouth daily. 320-25 mg        No Known Allergies  Past Medical History  Diagnosis Date  . Hypertension   . Depression   . Alcoholism     recovering since 2000  . PONV (postoperative nausea and vomiting)   . Other and unspecified general anesthetics causing adverse effect in therapeutic use post op delirium--  last anes record w/ chart  from   09-22-2009 (spinal w/ light sedation)  . Coronary artery disease CARDIOLOGIST- DR [place]--- LAST VISIT NOTE 09-07-2009  W/ CHART  . Inferior MI 2000--  POST PTCA W/ STENT X1  . Bipolar disorder   . Peripheral vascular disease POST RIGHT CAROTID SURG.  1995  . Status  post carotid endarterectomy RIGHT --  1995  . Status post primary angioplasty with coronary stent 2000--  POST INFERIOR MI  . Blood transfusion   . Normal cardiac stress test 2008-- PER DR [place] NOTE 09-07-2009  . Arthritis BACK  . Chronic back pain   . Osteoporosis   . Rosacea LEFT FACIAL RASH  . Idiopathic acute facial nerve palsy LEFT SIDE--  BOTOX THERAPY  . Blepharospasm LEFT EYE  . History of alcohol abuse RECOVERING SINCE 2000  . Anxiety   . Unstable balance WALKS W/ CANE  . HTN (hypertension)     Past Surgical History  Procedure Date  . Total knee arthroplasty 09-22-2009    RIGHT  . Vaginal hysterectomy 07-06-2009  . Hemiarthroplasty hip 12-26-2008    LEFT FEMORAL NECK FX  . Total hip arthroplasty 04-15-2008    POST FAILED  RIGHT HIP ORIF FEMORAL FX  . Cervical conization w/bx 09-23-2008  . Orif hip fracture 02-13-2007    RIGHT FEMORAL NECK FX  . Right shoulder surg. 2007  . Coronary angioplasty with stent placement 2000-   INFERIOR MI    X1 STENT TO RCA  . Carotid endarterectomy  1995    RIGHT  . Cataract extraction w/ intraocular lens  implant, bilateral   . Orif right distal radius and right proximal humerous neck fx's 10-10-2005    History  Smoking status  . Former Smoker -- 2.0 packs/day for 50 years  . Types: Cigarettes  . Quit date: 01/18/2011  Smokeless tobacco  . Never Used  Comment: STATES QUIT SMOKING 01-18-2011    History  Alcohol Use No    RECOVERING ALCOHOLIC--   QUIT IN 2000    Family History  Problem Relation Age of Onset  . Hypertension Mother   . Hypertension Father     Review of Systems: The review of systems is positive for weight gain since she quit smoking.   All other systems were reviewed and are negative.  Physical Exam: BP 123/87  Pulse 49  Ht 5\' 6"  (1.676 m)  Wt 159 lb 12.8 oz (72.485 kg)  BMI 25.79 kg/m2 She is a chronically ill appearing white female in no acute distress. HEENT exam is unremarkable. She has no JVD  . She has an old carotid endarterectomy scar on the right with a loud bruit. She has a chronic muscle twitch in her left face. Lungs are clear. Cardiac exam reveals a regular rate and rhythm without gallop, murmur, or click. Abdomen is soft and nontender. She has no masses or bruits. Femoral and pedal pulses are 2+ and symmetric. She has no edema. Skin is warm and dry. She is alert and oriented x3. Cranial nerves II through XII are intact.   LABORATORY DATA: Date 04/26/2011 urinalysis was normal. CBC was normal. Complete chemistry panel was normal. Total cholesterol 185, triglycerides 63, HDL 101, LDL 71. TSH was 1.2. On her previous visit she had a normal stress Myoview study. Ejection fraction was 69%. Carotid Doppler studies demonstrated a 60-79% stenosis in the right ICA. The left ICA had no significant disease.  Assessment / Plan:

## 2011-06-28 NOTE — Assessment & Plan Note (Signed)
She has moderate right carotid disease. We will repeat this study at the end of August. She is to report if she has any TIA symptoms.

## 2011-06-28 NOTE — Patient Instructions (Addendum)
Congratulations on quitting smoking.  Continue your current medications  Your physician has requested that you have a carotid duplex. This test is an ultrasound of the carotid arteries in your neck. It looks at blood flow through these arteries that supply the brain with blood. Allow one hour for this exam. There are no restrictions or special instructions. We will get a repeat carotid doppler at the end of August.  Your physician wants you to follow-up in: 6 months  You will receive a reminder letter in the mail two months in advance. If you don't receive a letter, please call our office to schedule the follow-up appointment.

## 2011-06-28 NOTE — Assessment & Plan Note (Signed)
Stress Myoview study was normal. We will continue with risk factor modification.

## 2011-06-28 NOTE — Assessment & Plan Note (Signed)
Blood pressure control is very good. Continue current therapy.

## 2011-07-01 ENCOUNTER — Telehealth: Payer: Self-pay | Admitting: *Deleted

## 2011-07-01 NOTE — Telephone Encounter (Signed)
Notified patient that the pap smear that was taken on 06/22/2011 by Dr. Gehrig was negative 

## 2011-07-04 DIAGNOSIS — G245 Blepharospasm: Secondary | ICD-10-CM | POA: Diagnosis not present

## 2011-07-05 ENCOUNTER — Encounter: Payer: Self-pay | Admitting: Cardiology

## 2011-07-25 DIAGNOSIS — M25569 Pain in unspecified knee: Secondary | ICD-10-CM | POA: Diagnosis not present

## 2011-08-12 DIAGNOSIS — H9209 Otalgia, unspecified ear: Secondary | ICD-10-CM | POA: Diagnosis not present

## 2011-08-26 DIAGNOSIS — M25569 Pain in unspecified knee: Secondary | ICD-10-CM | POA: Diagnosis not present

## 2011-08-30 ENCOUNTER — Encounter (INDEPENDENT_AMBULATORY_CARE_PROVIDER_SITE_OTHER): Payer: Medicare Other

## 2011-08-30 DIAGNOSIS — I6529 Occlusion and stenosis of unspecified carotid artery: Secondary | ICD-10-CM

## 2011-08-30 DIAGNOSIS — R0989 Other specified symptoms and signs involving the circulatory and respiratory systems: Secondary | ICD-10-CM

## 2011-08-31 DIAGNOSIS — E785 Hyperlipidemia, unspecified: Secondary | ICD-10-CM | POA: Diagnosis not present

## 2011-08-31 DIAGNOSIS — F309 Manic episode, unspecified: Secondary | ICD-10-CM | POA: Diagnosis not present

## 2011-08-31 DIAGNOSIS — F329 Major depressive disorder, single episode, unspecified: Secondary | ICD-10-CM | POA: Diagnosis not present

## 2011-08-31 DIAGNOSIS — G894 Chronic pain syndrome: Secondary | ICD-10-CM | POA: Diagnosis not present

## 2011-08-31 DIAGNOSIS — I10 Essential (primary) hypertension: Secondary | ICD-10-CM | POA: Diagnosis not present

## 2011-08-31 DIAGNOSIS — M545 Low back pain: Secondary | ICD-10-CM | POA: Diagnosis not present

## 2011-09-12 DIAGNOSIS — H9209 Otalgia, unspecified ear: Secondary | ICD-10-CM | POA: Diagnosis not present

## 2011-09-12 DIAGNOSIS — H60399 Other infective otitis externa, unspecified ear: Secondary | ICD-10-CM | POA: Diagnosis not present

## 2011-09-14 DIAGNOSIS — M47817 Spondylosis without myelopathy or radiculopathy, lumbosacral region: Secondary | ICD-10-CM | POA: Diagnosis not present

## 2011-09-27 DIAGNOSIS — H60399 Other infective otitis externa, unspecified ear: Secondary | ICD-10-CM | POA: Diagnosis not present

## 2011-09-30 DIAGNOSIS — F309 Manic episode, unspecified: Secondary | ICD-10-CM | POA: Diagnosis not present

## 2011-09-30 DIAGNOSIS — Z23 Encounter for immunization: Secondary | ICD-10-CM | POA: Diagnosis not present

## 2011-09-30 DIAGNOSIS — K219 Gastro-esophageal reflux disease without esophagitis: Secondary | ICD-10-CM | POA: Diagnosis not present

## 2011-09-30 DIAGNOSIS — I10 Essential (primary) hypertension: Secondary | ICD-10-CM | POA: Diagnosis not present

## 2011-10-14 ENCOUNTER — Other Ambulatory Visit: Payer: Self-pay | Admitting: Family Medicine

## 2011-10-14 DIAGNOSIS — Z1231 Encounter for screening mammogram for malignant neoplasm of breast: Secondary | ICD-10-CM

## 2011-10-20 DIAGNOSIS — G245 Blepharospasm: Secondary | ICD-10-CM | POA: Diagnosis not present

## 2011-10-26 ENCOUNTER — Telehealth: Payer: Self-pay | Admitting: Gynecologic Oncology

## 2011-10-26 NOTE — Telephone Encounter (Signed)
Called the patient to inform her of Dr. Denman George recommendations to follow up with Dr. Arlyce Dice for GYN care in Dec 2013.  Instructed that she could return to GYN Oncology clinic if problems arise in the future.  No problems or concerns voiced.

## 2011-11-16 DIAGNOSIS — Z0142 Encounter for cervical smear to confirm findings of recent normal smear following initial abnormal smear: Secondary | ICD-10-CM | POA: Diagnosis not present

## 2011-11-16 DIAGNOSIS — N760 Acute vaginitis: Secondary | ICD-10-CM | POA: Diagnosis not present

## 2011-11-16 DIAGNOSIS — B373 Candidiasis of vulva and vagina: Secondary | ICD-10-CM | POA: Diagnosis not present

## 2011-11-16 DIAGNOSIS — Z01419 Encounter for gynecological examination (general) (routine) without abnormal findings: Secondary | ICD-10-CM | POA: Diagnosis not present

## 2011-11-28 DIAGNOSIS — G245 Blepharospasm: Secondary | ICD-10-CM | POA: Diagnosis not present

## 2011-11-28 DIAGNOSIS — H04129 Dry eye syndrome of unspecified lacrimal gland: Secondary | ICD-10-CM | POA: Insufficient documentation

## 2011-11-28 DIAGNOSIS — G5139 Clonic hemifacial spasm, unspecified: Secondary | ICD-10-CM | POA: Insufficient documentation

## 2011-11-28 DIAGNOSIS — G518 Other disorders of facial nerve: Secondary | ICD-10-CM | POA: Diagnosis not present

## 2011-11-28 DIAGNOSIS — H02429 Myogenic ptosis of unspecified eyelid: Secondary | ICD-10-CM | POA: Insufficient documentation

## 2011-12-09 DIAGNOSIS — M47817 Spondylosis without myelopathy or radiculopathy, lumbosacral region: Secondary | ICD-10-CM | POA: Diagnosis not present

## 2011-12-20 ENCOUNTER — Telehealth: Payer: Self-pay | Admitting: Cardiology

## 2011-12-20 NOTE — Telephone Encounter (Signed)
plz return call to pt [phone] regarding questions about medical care

## 2011-12-20 NOTE — Telephone Encounter (Signed)
Patient called wanting to know when she should have carotid dopplers.Patient was told her last carotid dopplers 09/02/11 advised to repeat in 6 months.Schedulers will call to schedule carotid dopplers for 1/14.

## 2011-12-28 ENCOUNTER — Ambulatory Visit: Payer: Medicare Other | Attending: Gynecologic Oncology | Admitting: Gynecologic Oncology

## 2011-12-28 ENCOUNTER — Encounter: Payer: Self-pay | Admitting: Gynecologic Oncology

## 2011-12-28 VITALS — BP 118/78 | HR 60 | Temp 97.5°F | Resp 16 | Ht 65.5 in | Wt 159.3 lb

## 2011-12-28 DIAGNOSIS — N893 Dysplasia of vagina, unspecified: Secondary | ICD-10-CM | POA: Diagnosis not present

## 2011-12-28 DIAGNOSIS — Z79899 Other long term (current) drug therapy: Secondary | ICD-10-CM | POA: Insufficient documentation

## 2011-12-28 DIAGNOSIS — M810 Age-related osteoporosis without current pathological fracture: Secondary | ICD-10-CM | POA: Insufficient documentation

## 2011-12-28 DIAGNOSIS — Z90710 Acquired absence of both cervix and uterus: Secondary | ICD-10-CM | POA: Insufficient documentation

## 2011-12-28 DIAGNOSIS — C52 Malignant neoplasm of vagina: Secondary | ICD-10-CM | POA: Diagnosis not present

## 2011-12-28 DIAGNOSIS — I10 Essential (primary) hypertension: Secondary | ICD-10-CM | POA: Insufficient documentation

## 2011-12-28 DIAGNOSIS — I739 Peripheral vascular disease, unspecified: Secondary | ICD-10-CM | POA: Insufficient documentation

## 2011-12-28 DIAGNOSIS — Z96659 Presence of unspecified artificial knee joint: Secondary | ICD-10-CM | POA: Insufficient documentation

## 2011-12-28 DIAGNOSIS — I2510 Atherosclerotic heart disease of native coronary artery without angina pectoris: Secondary | ICD-10-CM | POA: Insufficient documentation

## 2011-12-28 DIAGNOSIS — I252 Old myocardial infarction: Secondary | ICD-10-CM | POA: Insufficient documentation

## 2011-12-28 HISTORY — PX: OTHER SURGICAL HISTORY: SHX169

## 2011-12-28 NOTE — Patient Instructions (Signed)
Please start using your premarin cream.

## 2011-12-28 NOTE — Progress Notes (Signed)
Consult Note: Gyn-Onc  [name] 66 y.o. female  CC:  Chief Complaint  Patient presents with  . VAIN    Follow up    HPI: [name] is a very pleasant 66 year old with a 10 year history of abnormal Pap smears. In April 2011 she had a Pap smear revealed atypical squamous cells cannot exclude a high-grade dysplasia. She was worked up and evaluated and subsequently underwent a vaginal hysterectomy in June of 2011. Pathology at that time revealed the cervix with high-grade squamous dysplasia. Focal adenomyosis. In addition, the ectocervical margin of the cervix was positive. She was seen by Dr. Arlyce Dice on October 29 at which time a Pap smear revealed VAIN2/VAIN 3.  She underwent laser ablation on for dysplasia on January 22. Operative findings included a raised hyperkeratotic lesion in the vagina encompassing the entire vaginal cuff. The area was acetowhite changing. The total area measured approximately 4 x 4 centimeters. I saw her for posttreatment exam on February 21 first at which time she had healed quite well. She has been using her Premarin cream since that time.   Interval History:  I last saw her in June of this year which time her Pap smear was normal. She subsequently saw Dr. Arlyce Dice October 30 and her Pap smear returned as atypical squamous cells of undetermined significance. The patient has positive high-risk HPV. Since she saw me in June when she got the nose abnormal Pap smear she subsequently stopped using her estrogen cranium. She does complain of a vaginal discharge there is no pruritus or burning there is no blood. She had the discharge when she saw Dr. Arlyce Dice but the testing he did "did not show any evidence of infection". She otherwise has no complaints.  Review of Systems  Current Meds:  Outpatient Encounter Prescriptions as of 12/28/2011  Medication Sig Dispense Refill  . ARIPiprazole (ABILIFY) 15 MG tablet Take 15 mg by mouth daily.       . calcium-vitamin D (OSCAL  WITH D) 500-200 MG-UNIT per tablet Take 1 tablet by mouth daily.       . cholecalciferol (VITAMIN D) 1000 UNITS tablet Take 2,000 Units by mouth daily. Vitamin d3      . clonazePAM (KLONOPIN) 1 MG tablet Take 1 mg by mouth 3 (three) times daily.       . cloNIDine (CATAPRES) 0.2 MG tablet Take 0.2 mg by mouth 2 (two) times daily.       Marland Kitchen lamoTRIgine (LAMICTAL) 100 MG tablet Take 100 mg by mouth 2 (two) times daily.       . metroNIDAZOLE (METROCREAM) 0.75 % cream Apply topically daily.       . Multiple Vitamin (MULTIVITAMIN) capsule Take 1 capsule by mouth daily.       . sertraline (ZOLOFT) 50 MG tablet Take 50 mg by mouth daily.       . simvastatin (ZOCOR) 10 MG tablet Take 10 mg by mouth at bedtime.       . Valsartan (DIOVAN PO) Take by mouth daily. 320-25 mg        Allergy: No Known Allergies  Social Hx:   History   Social History  . Marital Status: Divorced    Spouse Name: N/A    Number of Children: N/A  . Years of Education: N/A   Occupational History  . Not on file.   Social History Main Topics  . Smoking status: Former Smoker -- 2.0 packs/day for 50 years    Types: Cigarettes  Quit date: 01/18/2011  . Smokeless tobacco: Never Used     Comment: STATES QUIT SMOKING 01-18-2011  . Alcohol Use: No     Comment: RECOVERING ALCOHOLIC--   QUIT IN 2000  . Drug Use: No  . Sexually Active: No   Other Topics Concern  . Not on file   Social History Narrative  . No narrative on file    Past Surgical Hx:  Past Surgical History  Procedure Date  . Total knee arthroplasty 09-22-2009    RIGHT  . Vaginal hysterectomy 07-06-2009  . Hemiarthroplasty hip 12-26-2008    LEFT FEMORAL NECK FX  . Total hip arthroplasty 04-15-2008    POST FAILED  RIGHT HIP ORIF FEMORAL FX  . Cervical conization w/bx 09-23-2008  . Orif hip fracture 02-13-2007    RIGHT FEMORAL NECK FX  . Right shoulder surg. 2007  . Coronary angioplasty with stent placement 2000-   INFERIOR MI    X1 STENT TO RCA  .  Carotid endarterectomy 1995    RIGHT  . Cataract extraction w/ intraocular lens  implant, bilateral   . Orif right distal radius and right proximal humerous neck fx's 10-10-2005    Past Medical Hx:  Past Medical History  Diagnosis Date  . Hypertension   . Depression   . Alcoholism     recovering since 2000  . PONV (postoperative nausea and vomiting)   . Other and unspecified general anesthetics causing adverse effect in therapeutic use post op delirium--  last anes record w/ chart  from   09-22-2009 (spinal w/ light sedation)  . Coronary artery disease CARDIOLOGIST- DR [place]--- LAST VISIT NOTE 09-07-2009  W/ CHART  . Inferior MI 2000--  POST PTCA W/ STENT X1  . Bipolar disorder   . Peripheral vascular disease POST RIGHT CAROTID SURG.  1995  . Status post carotid endarterectomy RIGHT --  1995  . Status post primary angioplasty with coronary stent 2000--  POST INFERIOR MI  . Blood transfusion   . Normal cardiac stress test 2008-- PER DR [place] NOTE 09-07-2009  . Arthritis BACK  . Chronic back pain   . Osteoporosis   . Rosacea LEFT FACIAL RASH  . Idiopathic acute facial nerve palsy LEFT SIDE--  BOTOX THERAPY  . Blepharospasm LEFT EYE  . History of alcohol abuse RECOVERING SINCE 2000  . Anxiety   . Unstable balance WALKS W/ CANE  . HTN (hypertension)     Family Hx:  Family History  Problem Relation Age of Onset  . Hypertension Mother   . Hypertension Father     Vitals:  Blood pressure 118/78, pulse 60, temperature 97.5 F (36.4 C), temperature source Oral, resp. rate 16, height 5' 5.5" (1.664 m), weight 159 lb 4.8 oz (72.258 kg).  Physical Exam:  Well-nourished well-developed female in no acute distress.  Pelvic: Normal external female genitalia. Vagina is atrophic. There is no gross visible lesions. Colposcopic exam was performed after the application acetic acid. While there no acetowhite epithelial changes, there are some punctate and telangectasia changes on the  upper right vaginal fornix. After obtaining her verbal consent of vaginal biopsy was performed. She tolerated it well. Bleeding. Silver nitrate.  Assessment/Plan: 66 year old with history of VIN 2-3. They will call her with the results of the biopsy and determine her disposition pending these results. She was given a prescription for Premarin cream and will start using that.  Raena Pau A., MD 12/28/2011, 12:15 PM

## 2011-12-29 ENCOUNTER — Telehealth: Payer: Self-pay | Admitting: *Deleted

## 2011-12-29 NOTE — Telephone Encounter (Signed)
Patient notified of Path results.   

## 2012-01-04 ENCOUNTER — Encounter: Payer: Self-pay | Admitting: Gynecologic Oncology

## 2012-01-04 ENCOUNTER — Ambulatory Visit: Payer: Medicare Other | Attending: Gynecologic Oncology | Admitting: Gynecologic Oncology

## 2012-01-04 VITALS — BP 138/80 | HR 56 | Temp 98.0°F | Resp 18 | Ht 65.5 in | Wt 159.1 lb

## 2012-01-04 DIAGNOSIS — Z90710 Acquired absence of both cervix and uterus: Secondary | ICD-10-CM | POA: Diagnosis not present

## 2012-01-04 DIAGNOSIS — I252 Old myocardial infarction: Secondary | ICD-10-CM | POA: Insufficient documentation

## 2012-01-04 DIAGNOSIS — I10 Essential (primary) hypertension: Secondary | ICD-10-CM | POA: Insufficient documentation

## 2012-01-04 DIAGNOSIS — Z96659 Presence of unspecified artificial knee joint: Secondary | ICD-10-CM | POA: Insufficient documentation

## 2012-01-04 DIAGNOSIS — Z79899 Other long term (current) drug therapy: Secondary | ICD-10-CM | POA: Insufficient documentation

## 2012-01-04 DIAGNOSIS — I2510 Atherosclerotic heart disease of native coronary artery without angina pectoris: Secondary | ICD-10-CM | POA: Insufficient documentation

## 2012-01-04 DIAGNOSIS — C52 Malignant neoplasm of vagina: Secondary | ICD-10-CM | POA: Insufficient documentation

## 2012-01-04 NOTE — Progress Notes (Signed)
Consult Note: Gyn-Onc  [name] 66 y.o. female  CC:  Chief Complaint  Patient presents with  . Vaginal Cancer    Follow up    HPI: [name] is a very pleasant 58 year old with a 10 year history of abnormal Pap smears. In April 2011 she had a Pap smear revealed atypical squamous cells cannot exclude a high-grade dysplasia. She was worked up and evaluated and subsequently underwent a vaginal hysterectomy in June of 2011. Pathology at that time revealed the cervix with high-grade squamous dysplasia. Focal adenomyosis. In addition, the ectocervical margin of the cervix was positive. She was seen by Dr. Arlyce Dice on October 29 at which time a Pap smear revealed VAIN2/VAIN 3.  She underwent laser ablation on for dysplasia on January 22. Operative findings included a raised hyperkeratotic lesion in the vagina encompassing the entire vaginal cuff. The area was acetowhite changing. The total area measured approximately 4 x 4 centimeters. I saw her for posttreatment exam on February 21 first at which time she had healed quite well. She has been using her Premarin cream since that time.   I saw her in June of this year which time her Pap smear was normal. She subsequently saw Dr. Arlyce Dice October 30 and her Pap smear returned as atypical squamous cells of undetermined significance. The patient has positive high-risk HPV. Since she saw me in June when she got the nose abnormal Pap smear she subsequently stopped using her estrogen cranium. She does complain of a vaginal discharge there is no pruritus or burning there is no blood. She had the discharge when she saw Dr. Arlyce Dice but the testing he did "did not show any evidence of infection". She otherwise has no complaints.   I saw her last week at which time on exam there is no acetowhite epithelial changes at the top of the vagina. However, there is a punctate area with telangiectasias in the upper right vaginal fornix. A biopsy of this was performed. It  revealed a squamous cell carcinoma. She comes in today to discuss these results and treatment planning.  Current Meds:  Outpatient Encounter Prescriptions as of 01/04/2012  Medication Sig Dispense Refill  . ARIPiprazole (ABILIFY) 15 MG tablet Take 15 mg by mouth daily.       . calcium-vitamin D (OSCAL WITH D) 500-200 MG-UNIT per tablet Take 1 tablet by mouth daily.       . cholecalciferol (VITAMIN D) 1000 UNITS tablet Take 2,000 Units by mouth daily. Vitamin d3      . clonazePAM (KLONOPIN) 1 MG tablet Take 1 mg by mouth 3 (three) times daily.       . cloNIDine (CATAPRES) 0.2 MG tablet Take 0.2 mg by mouth 2 (two) times daily.       Marland Kitchen lamoTRIgine (LAMICTAL) 100 MG tablet Take 100 mg by mouth 2 (two) times daily.       . metroNIDAZOLE (METROCREAM) 0.75 % cream Apply topically daily.       . Multiple Vitamin (MULTIVITAMIN) capsule Take 1 capsule by mouth daily.       . sertraline (ZOLOFT) 50 MG tablet Take 50 mg by mouth daily.       . simvastatin (ZOCOR) 10 MG tablet Take 10 mg by mouth at bedtime.       . Valsartan (DIOVAN PO) Take by mouth daily. 320-25 mg        Allergy: No Known Allergies  Social Hx:   History   Social History  . Marital  Status: Divorced    Spouse Name: N/A    Number of Children: N/A  . Years of Education: N/A   Occupational History  . Not on file.   Social History Main Topics  . Smoking status: Former Smoker -- 2.0 packs/day for 50 years    Types: Cigarettes    Quit date: 01/18/2011  . Smokeless tobacco: Never Used     Comment: STATES QUIT SMOKING 01-18-2011  . Alcohol Use: No     Comment: RECOVERING ALCOHOLIC--   QUIT IN 2000  . Drug Use: No  . Sexually Active: No   Other Topics Concern  . Not on file   Social History Narrative  . No narrative on file    Past Surgical Hx:  Past Surgical History  Procedure Date  . Total knee arthroplasty 09-22-2009    RIGHT  . Vaginal hysterectomy 07-06-2009  . Hemiarthroplasty hip 12-26-2008    LEFT  FEMORAL NECK FX  . Total hip arthroplasty 04-15-2008    POST FAILED  RIGHT HIP ORIF FEMORAL FX  . Cervical conization w/bx 09-23-2008  . Orif hip fracture 02-13-2007    RIGHT FEMORAL NECK FX  . Right shoulder surg. 2007  . Coronary angioplasty with stent placement 2000-   INFERIOR MI    X1 STENT TO RCA  . Carotid endarterectomy 1995    RIGHT  . Cataract extraction w/ intraocular lens  implant, bilateral   . Orif right distal radius and right proximal humerous neck fx's 10-10-2005    Past Medical Hx:  Past Medical History  Diagnosis Date  . Hypertension   . Depression   . Alcoholism     recovering since 2000  . PONV (postoperative nausea and vomiting)   . Other and unspecified general anesthetics causing adverse effect in therapeutic use post op delirium--  last anes record w/ chart  from   09-22-2009 (spinal w/ light sedation)  . Coronary artery disease CARDIOLOGIST- DR [place]--- LAST VISIT NOTE 09-07-2009  W/ CHART  . Inferior MI 2000--  POST PTCA W/ STENT X1  . Bipolar disorder   . Peripheral vascular disease POST RIGHT CAROTID SURG.  1995  . Status post carotid endarterectomy RIGHT --  1995  . Status post primary angioplasty with coronary stent 2000--  POST INFERIOR MI  . Blood transfusion   . Normal cardiac stress test 2008-- PER DR [place] NOTE 09-07-2009  . Arthritis BACK  . Chronic back pain   . Osteoporosis   . Rosacea LEFT FACIAL RASH  . Idiopathic acute facial nerve palsy LEFT SIDE--  BOTOX THERAPY  . Blepharospasm LEFT EYE  . History of alcohol abuse RECOVERING SINCE 2000  . Anxiety   . Unstable balance WALKS W/ CANE  . HTN (hypertension)     Family Hx:  Family History  Problem Relation Age of Onset  . Hypertension Mother   . Hypertension Father     Vitals:  Blood pressure 138/80, pulse 56, temperature 98 F (36.7 C), temperature source Oral, resp. rate 18, height 5' 5.5" (1.664 m), weight 159 lb 1.6 oz (72.167 kg).  Physical Exam:  Well-nourished  well-developed female in no acute distress. Exam was deferred as she had a pelvic exam last week.  Assessment/Plan: 66 year old with a clinical stage I vaginal carcinoma. I believe that she would benefit most from a vaginal brachytherapy as treatment for her malignancy. We'll need to get imaging to assure that there's no evidence of metastatic disease. Pending the results of her imaging we will  arrange for her to be seen by radiation oncology.  Katherene Dinino A., MD 01/04/2012, 10:12 AM

## 2012-01-04 NOTE — Patient Instructions (Signed)
Follow up for PET/CT

## 2012-01-12 ENCOUNTER — Encounter (HOSPITAL_COMMUNITY)
Admission: RE | Admit: 2012-01-12 | Discharge: 2012-01-12 | Disposition: A | Discharge status: Home / Self Care | Payer: Medicare Other | Source: Ambulatory Visit | Attending: Gynecologic Oncology | Admitting: Gynecologic Oncology

## 2012-01-12 ENCOUNTER — Telehealth: Payer: Self-pay | Admitting: Gynecologic Oncology

## 2012-01-12 DIAGNOSIS — N281 Cyst of kidney, acquired: Secondary | ICD-10-CM | POA: Diagnosis not present

## 2012-01-12 DIAGNOSIS — I517 Cardiomegaly: Secondary | ICD-10-CM | POA: Diagnosis not present

## 2012-01-12 DIAGNOSIS — C52 Malignant neoplasm of vagina: Secondary | ICD-10-CM | POA: Insufficient documentation

## 2012-01-12 DIAGNOSIS — Z90710 Acquired absence of both cervix and uterus: Secondary | ICD-10-CM | POA: Diagnosis not present

## 2012-01-12 DIAGNOSIS — I700 Atherosclerosis of aorta: Secondary | ICD-10-CM | POA: Diagnosis not present

## 2012-01-12 DIAGNOSIS — I2510 Atherosclerotic heart disease of native coronary artery without angina pectoris: Secondary | ICD-10-CM | POA: Diagnosis not present

## 2012-01-12 DIAGNOSIS — Z96649 Presence of unspecified artificial hip joint: Secondary | ICD-10-CM | POA: Diagnosis not present

## 2012-01-12 MED ORDER — FLUDEOXYGLUCOSE F - 18 (FDG) INJECTION
17.2000 | Freq: Once | INTRAVENOUS | Status: AC | PRN
  Administered 2012-01-12: 17.2 via INTRAVENOUS

## 2012-01-12 NOTE — Telephone Encounter (Signed)
Patient notified of PET scan results and Dr. Denman George recommendations to proceed with new patient appointment with Radiation Oncology in Jan. 2014.  Instructed to call for any questions or concerns.  Dr. Duard Brady was notified of the PET scan results also.

## 2012-01-13 ENCOUNTER — Telehealth: Payer: Self-pay | Admitting: *Deleted

## 2012-01-16 NOTE — Telephone Encounter (Signed)
error 

## 2012-01-19 ENCOUNTER — Telehealth: Payer: Self-pay | Admitting: Gynecologic Oncology

## 2012-01-19 NOTE — Telephone Encounter (Signed)
Message left instructing the patient to follow up with Dr. Duard Brady when finished with radiation.  Instructed to call for any questions or concerns.

## 2012-01-24 ENCOUNTER — Encounter: Payer: Self-pay | Admitting: Radiation Oncology

## 2012-01-24 DIAGNOSIS — E785 Hyperlipidemia, unspecified: Secondary | ICD-10-CM | POA: Diagnosis not present

## 2012-01-24 DIAGNOSIS — C52 Malignant neoplasm of vagina: Secondary | ICD-10-CM | POA: Diagnosis not present

## 2012-01-24 DIAGNOSIS — K219 Gastro-esophageal reflux disease without esophagitis: Secondary | ICD-10-CM | POA: Diagnosis not present

## 2012-01-24 DIAGNOSIS — I10 Essential (primary) hypertension: Secondary | ICD-10-CM | POA: Diagnosis not present

## 2012-01-24 DIAGNOSIS — F319 Bipolar disorder, unspecified: Secondary | ICD-10-CM | POA: Diagnosis not present

## 2012-01-25 ENCOUNTER — Encounter: Payer: Self-pay | Admitting: Radiation Oncology

## 2012-01-25 ENCOUNTER — Ambulatory Visit
Admission: RE | Admit: 2012-01-25 | Discharge: 2012-01-25 | Disposition: A | Discharge status: Home / Self Care | Payer: Medicare Other | Source: Ambulatory Visit | Attending: Radiation Oncology | Admitting: Radiation Oncology

## 2012-01-25 VITALS — BP 152/70 | HR 50 | Temp 97.7°F | Resp 20 | Ht 66.0 in | Wt 163.0 lb

## 2012-01-25 DIAGNOSIS — I10 Essential (primary) hypertension: Secondary | ICD-10-CM | POA: Diagnosis not present

## 2012-01-25 DIAGNOSIS — Z96649 Presence of unspecified artificial hip joint: Secondary | ICD-10-CM | POA: Diagnosis not present

## 2012-01-25 DIAGNOSIS — C539 Malignant neoplasm of cervix uteri, unspecified: Secondary | ICD-10-CM

## 2012-01-25 DIAGNOSIS — I2510 Atherosclerotic heart disease of native coronary artery without angina pectoris: Secondary | ICD-10-CM | POA: Diagnosis not present

## 2012-01-25 DIAGNOSIS — Z90710 Acquired absence of both cervix and uterus: Secondary | ICD-10-CM | POA: Diagnosis not present

## 2012-01-25 DIAGNOSIS — Z87891 Personal history of nicotine dependence: Secondary | ICD-10-CM | POA: Insufficient documentation

## 2012-01-25 DIAGNOSIS — C52 Malignant neoplasm of vagina: Secondary | ICD-10-CM | POA: Diagnosis not present

## 2012-01-25 DIAGNOSIS — Z87410 Personal history of cervical dysplasia: Secondary | ICD-10-CM | POA: Diagnosis not present

## 2012-01-25 DIAGNOSIS — Z96659 Presence of unspecified artificial knee joint: Secondary | ICD-10-CM | POA: Insufficient documentation

## 2012-01-25 DIAGNOSIS — Z79899 Other long term (current) drug therapy: Secondary | ICD-10-CM | POA: Insufficient documentation

## 2012-01-25 DIAGNOSIS — I252 Old myocardial infarction: Secondary | ICD-10-CM | POA: Insufficient documentation

## 2012-01-25 HISTORY — DX: Malignant neoplasm of vagina: C52

## 2012-01-25 NOTE — Progress Notes (Signed)
Please see the Nurse Progress Note in the MD Initial Consult Encounter for this patient. 

## 2012-01-25 NOTE — Addendum Note (Signed)
Encounter addended by: Lonie Peak, MD on: 01/25/2012 11:53 AM<BR>     Documentation filed: Notes Section

## 2012-01-25 NOTE — Progress Notes (Signed)
New Consult  Cervical Cancer,,12/28/11 Right upper Vagina biopsy=Squamous cell Carcinoma  Divorced,no children, alert,oriented x3 uses a cane, , says discharge at times, no bleeding in vagina, there is soreness stated , left eye gets botox injections around left eye every 4 months Takes pain  Medication Vicodin  For her back pain, has treatments on her back as well every 4 months,Injections not sure of what  Allergies:NKDA

## 2012-01-25 NOTE — Progress Notes (Addendum)
Radiation Oncology         [phone]) [phone] ________________________________  Initial outpatient Consultation  Name: [name] MRN: [id number]  Date: 01/25/2012  DOB: 1945/05/15  LK:[id number],UVOZDGUYQ, MD  Thompson Caul., MD   REFERRING PHYSICIAN: Thompson Caul., MD  DIAGNOSIS: Persistently recurrent cervical dysplasia, status post simple hysterectomy, now with Squamous Cell Carcinoma at the Vaginal Cuff  HISTORY OF PRESENT ILLNESS::[name] is a 67 y.o. female who has a 10 year history of abnormal Pap smears. On 09/23/2008 she underwent cervical conization. This demonstrated high-grade intraepithelial lesion, CIN 2 and 3. The endocervical and ectocervical margins were positive. She underwent simple hysterectomy on 07/06/2009. The uterus was benign. The cervix demonstrated high-grade squamous intraepithelial lesion, CIN-2 and 3. The ectocervical margin was involved. Biopsy of the apex the vagina on 12/14/2010 revealed VAIN-3 severe squamous dysplasia. In January 2013 she underwent CO2 laser ablation treatments of the vaginal cuff for this. Operative findings demonstrated a raised hyperkeratotic lesion of the vagina encompassing the entire vaginal cuff, measuring 4x4 cm.  Pap smear and 06/22/2011 was negative. However, exam on 12-28-11 revealed punctate  telangectasia changes on the upper right vaginal fornix.   Biopsy of the upper right vagina on 12/28/2011 revealed squamous cell carcinoma. No underlying stroma was available for evaluation of invasion.  She underwent PET scan on 01/12/2012. The lower pelvis is obscured by streak out of artifact due to hip prostheses, so this is a limited study. However there is no evidence of hypermetabolic activity to suggest metastatic disease, nor lymphadenopathy in the pelvis.  The patient denies any vaginal bleeding, urinary complaints, hematuria. She does have constipation and sometimes blood on her toilet tissue after stools.  No streaking in her  stool with blood.  I advised her to follow/up with her PCP to make sure her colonoscopies are up to date. She is not sexually active. She gave birth to one child in the past and this child was adopted. She reports no prior history of radiotherapy. No prior cancers before the cervical cancer. She reports that she needs help with her activities of daily living such as dressing, due to hip and knee surgery done in the past. She is a friend present with her today who lives with her as well. She reports that she has had some pain in the external genitalia, right side, for about 2 weeks. She reports that Dr. Arlyce Dice gave her a topical agent for this but she is not sure what it is. She port that she is undergoing carotid ultrasound in the near future for "changes."   She uses a cane to ambulate. She has left-sided ptosis for which she undergoes Botox injections. She has chronic pulling of the left side of her face. She has problems with balance. She quit smoking one year ago.   PREVIOUS RADIATION THERAPY: No  PAST MEDICAL HISTORY:  has a past medical history of Hypertension; Depression; Alcoholism; PONV (postoperative nausea and vomiting); Other and unspecified general anesthetics causing adverse effect in therapeutic use (post op delirium--  last anes record w/ chart  from   09-22-2009 (spinal w/ light sedation)); Coronary artery disease (CARDIOLOGIST- DR [place]--- LAST VISIT NOTE 09-07-2009  W/ CHART); Inferior MI (2000--  POST PTCA W/ STENT X1); Bipolar disorder; Peripheral vascular disease (POST RIGHT CAROTID SURG.  1995); Status post carotid endarterectomy (RIGHT --  1995); Status post primary angioplasty with coronary stent (2000--  POST INFERIOR MI); Blood transfusion; Normal cardiac stress test (2008-- PER DR [place]  NOTE 09-07-2009); Arthritis (BACK); Chronic back pain; Osteoporosis; Rosacea (LEFT FACIAL RASH); Idiopathic acute facial nerve palsy (LEFT SIDE--  BOTOX THERAPY); Blepharospasm (LEFT EYE); History  of alcohol abuse (RECOVERING SINCE 2000); Anxiety; Unstable balance (WALKS W/ CANE); HTN (hypertension); Vaginal cancer; and Cervical cancer (12/28/11).    PAST SURGICAL HISTORY: Past Surgical History  Procedure Date  . Total knee arthroplasty 09-22-2009    RIGHT  . Vaginal hysterectomy 07-06-2009  . Hemiarthroplasty hip 12-26-2008    LEFT FEMORAL NECK FX  . Total hip arthroplasty 04-15-2008    POST FAILED  RIGHT HIP ORIF FEMORAL FX  . Cervical conization w/bx 09-23-2008  . Orif hip fracture 02-13-2007    RIGHT FEMORAL NECK FX  . Right shoulder surg. 2007  . Coronary angioplasty with stent placement 2000-   INFERIOR MI    X1 STENT TO RCA  . Carotid endarterectomy 1995    RIGHT  . Cataract extraction w/ intraocular lens  implant, bilateral   . Orif right distal radius and right proximal humerous neck fx's 10-10-2005  . Upper right vaginal region 12/28/11    BIOPSY: SQUAMOUS CELL CARCINOMA    FAMILY HISTORY: family history includes Hypertension in her father and mother.  SOCIAL HISTORY:  reports that she quit smoking about a year ago. Her smoking use included Cigarettes. She has a 100 pack-year smoking history. She has never used smokeless tobacco. She reports that she does not drink alcohol or use illicit drugs.  ALLERGIES: Review of patient's allergies indicates no known allergies.  MEDICATIONS:  Current Outpatient Prescriptions  Medication Sig Dispense Refill  . esomeprazole (NEXIUM) 20 MG capsule Take 20 mg by mouth daily before breakfast.      . ARIPiprazole (ABILIFY) 15 MG tablet Take 15 mg by mouth daily.       . calcium-vitamin D (OSCAL WITH D) 500-200 MG-UNIT per tablet Take 1 tablet by mouth daily.       . cholecalciferol (VITAMIN D) 1000 UNITS tablet Take 2,000 Units by mouth daily. Vitamin d3      . clonazePAM (KLONOPIN) 1 MG tablet Take 1 mg by mouth 3 (three) times daily.       . cloNIDine (CATAPRES) 0.2 MG tablet Take 0.2 mg by mouth 2 (two) times daily.       Marland Kitchen  lamoTRIgine (LAMICTAL) 100 MG tablet Take 100 mg by mouth 2 (two) times daily.       . metroNIDAZOLE (METROCREAM) 0.75 % cream Apply topically daily.       . Multiple Vitamin (MULTIVITAMIN) capsule Take 1 capsule by mouth daily.       . sertraline (ZOLOFT) 50 MG tablet Take 50 mg by mouth daily.       . simvastatin (ZOCOR) 10 MG tablet Take 10 mg by mouth at bedtime.       . Valsartan (DIOVAN PO) Take by mouth daily. 320-25 mg        REVIEW OF SYSTEMS: A complete review of systems is obtained. Pertinent items are noted in HPI. otherwise negative.   PHYSICAL EXAM:  height is 5\' 6"  (1.676 m) and weight is 163 lb (73.936 kg). Her oral temperature is 97.7 F (36.5 C). Her blood pressure is 152/70 and her pulse is 50. Her respiration is 20 and oxygen saturation is 100%.   General: Alert and oriented, in no acute distress, somewhat frail appearing HEENT: Head is normocephalic. Left sided ptosis.Marland Kitchen Extraocular movements are intact. Oropharynx is clear. Facial pulling on the left side with intermittent  twitches Neck: Neck is supple, no palpable cervical or supraclavicular lymphadenopathy. Heart: Regular in rate and rhythm with no murmurs, rubs, or gallops. Chest: Clear to auscultation bilaterally, with no rhonchi, wheezes, or rales. Abdomen: Soft, nontender, nondistended, with no rigidity or guarding. Extremities: Lower extremity edema bilaterally.  pitting.  Lymphatics: No concerning lymphadenopathy in the neck or groin. Musculoskeletal: Uses a cane to ambulate. Needs help dressing and undressing. Neurologic: Facial asymmetry as above Psychiatric: Judgment and insight are intact. Affect is blunted Gynecologic: Externally, there is a mildly white patch over the vulva, dysplastic appearing, just distal to the 6:00 region of the vaginal introitus. No palpable nodules in the vagina or the rectum. No signs of fistula in the rectovaginal region. Small patch of erythema at the right vaginal apex. No  exophytic or raised lesions.   LABORATORY DATA:  Lab Results  Component Value Date   WBC 11.2* 09/24/2009   HGB 16.0* 02/08/2011   HCT 47.0* 02/08/2011   MCV 92.7 09/24/2009   PLT 199 09/24/2009   CMP     Component Value Date/Time   NA 137 02/08/2011 0640   K 3.9 02/08/2011 0640   CL 97 09/24/2009 0404   CO2 28 09/24/2009 0404   GLUCOSE 94 02/08/2011 0640   BUN 5* 09/24/2009 0404   CREATININE 0.53 09/24/2009 0404   CALCIUM 9.6 09/24/2009 0404   PROT 5.1* 02/08/2009 1609   ALBUMIN 2.4* 02/08/2009 1609   AST 18 02/08/2009 1609   ALT 17 02/08/2009 1609   ALKPHOS 83 02/08/2009 1609   BILITOT 0.3 02/08/2009 1609   GFRNONAA >60 09/24/2009 0404   GFRAA  Value: >60        The eGFR has been calculated using the MDRD equation. This calculation has not been validated in all clinical situations. eGFR's persistently <60 mL/min signify possible Chronic Kidney Disease. 09/24/2009 0404         RADIOGRAPHY: Nm Pet Image Initial (pi) Skull Base To Thigh  01/12/2012  *RADIOLOGY REPORT*  Clinical Data: Initial treatment strategy for stage I vaginal cancer.  NUCLEAR MEDICINE PET SKULL BASE TO THIGH  Fasting Blood Glucose:  97  Technique:  17.2 mCi F-18 FDG was injected intravenously. CT data was obtained and used for attenuation correction and anatomic localization only.  (This was not acquired as a diagnostic CT examination.) Additional exam technical data entered on technologist worksheet.  Comparison:  None.  Findings:  Neck: No hypermetabolic lymph nodes in the neck.  Chest:  No hypermetabolic mediastinal or hilar nodes.  Extensive centrilobular emphysematous changes.  No suspicious pulmonary nodules on the CT scan.  Cardiomegaly.  Coronary atherosclerosis.  Atherosclerotic calcifications of the aortic arch.  Abdomen/Pelvis:  No abnormal hypermetabolic activity within the liver, pancreas, adrenal glands, or spleen.  3.7 x 3.3 cm left upper pole renal cyst.  Atherosclerotic calcifications of the aortic arch and branch vessels.   Status post hysterectomy.  Lower pelvis is obscured by streak artifact from the patient's bilateral hip prostheses.  No hypermetabolic lymph nodes in the abdomen or pelvis.  Skeleton:  No focal hypermetabolic activity to suggest skeletal metastasis.  Status post ORIF of the proximal right humerus.  Bilateral total hip arthroplasties.  IMPRESSION: Lower pelvis is obscured by streak artifact from the patient's bilateral hip prostheses.  No evidence of distant metastases.   Original Report Authenticated By: Charline Bills, M.D.       IMPRESSION/PLAN:  This is a very pleasant 67 year old woman, with multiple comorbidities, rather frail, with  persistently recurrent dysplastic processes in the cervix and now vagina.  I suspect that this is a recurrent process from her original cervical dysplasia. Most recent biopsy of the upper right vagina revealed squamous cell carcinoma. No underlying stroma was present in the biopsy for proof of invasion.  Due to  the lack of proof that there is stromal invasion and her negative PET, I am not terribly concerned about the patient's lymph nodes in the pelvis and agree with Dr. Duard Brady that brachytherapy alone is the best approach (and less morbid than adding external beam RT). She is quite frail, with multiple comorbidities, and therefore I feel that the risks and benefits of intracavitary brachytherapy alone are in keeping with her overall clinical status.  I anticipate delivering 3-5 treatments of intracavitary vaginal cuff brachytherapy. I plan to treat the upper 1/2-2/3 of the vagina. If the patient receives 5 treatments, and these will be smaller fractions than a 3 treatment regimen.    I e-mailed Dr. Duard Brady as I did notice there is a mildly white patch over the vulva, dysplastic appearing, just distal to the 6:00 region of the vaginal introitus.  With her HPV+ status and  history, I wanted to bring this to her attention in case she would like to examine the patient and  then determine if any additional biopsies are necessary. I doubt it is anything invasive, and more likely representing dysplastic cells that would not warrant vulvar radiation.  Once I hear back from Dr. Duard Brady, I  will contact the patient  to schedule planning. I explained to the patient that the main risks of therapy would include fatigue, vaginal stenosis and dryness, and a low risk of fistulas; low risk of urinary, bowel, and rectal injury. She is enthusiastic about proceeding. A consent form will be signed at her next visit.  I spent 60 minutes minutes face to face with the patient and more than 50% of that time was spent in counseling and/or coordination of care.   __________________________________________   Lonie Peak, MD

## 2012-01-26 NOTE — Addendum Note (Signed)
Encounter addended by: Delynn Flavin, RN on: 01/26/2012  6:02 PM<BR>     Documentation filed: Charges VN

## 2012-01-27 ENCOUNTER — Other Ambulatory Visit: Payer: Self-pay | Admitting: Radiation Oncology

## 2012-01-30 ENCOUNTER — Other Ambulatory Visit: Payer: Self-pay | Admitting: Radiation Oncology

## 2012-01-31 ENCOUNTER — Telehealth: Payer: Self-pay | Admitting: *Deleted

## 2012-01-31 NOTE — Telephone Encounter (Signed)
Called patient to inform of HDR Case, spoke with patient and she is aware of these appts. 

## 2012-02-03 DIAGNOSIS — M1990 Unspecified osteoarthritis, unspecified site: Secondary | ICD-10-CM | POA: Insufficient documentation

## 2012-02-03 DIAGNOSIS — G510 Bell's palsy: Secondary | ICD-10-CM | POA: Insufficient documentation

## 2012-02-03 DIAGNOSIS — F419 Anxiety disorder, unspecified: Secondary | ICD-10-CM | POA: Insufficient documentation

## 2012-02-03 DIAGNOSIS — F329 Major depressive disorder, single episode, unspecified: Secondary | ICD-10-CM | POA: Insufficient documentation

## 2012-02-03 DIAGNOSIS — F32A Depression, unspecified: Secondary | ICD-10-CM | POA: Insufficient documentation

## 2012-02-03 DIAGNOSIS — G245 Blepharospasm: Secondary | ICD-10-CM | POA: Insufficient documentation

## 2012-02-03 DIAGNOSIS — I10 Essential (primary) hypertension: Secondary | ICD-10-CM | POA: Insufficient documentation

## 2012-02-03 DIAGNOSIS — C52 Malignant neoplasm of vagina: Secondary | ICD-10-CM | POA: Insufficient documentation

## 2012-02-03 DIAGNOSIS — Z9889 Other specified postprocedural states: Secondary | ICD-10-CM | POA: Insufficient documentation

## 2012-02-03 DIAGNOSIS — F319 Bipolar disorder, unspecified: Secondary | ICD-10-CM | POA: Insufficient documentation

## 2012-02-03 DIAGNOSIS — F1020 Alcohol dependence, uncomplicated: Secondary | ICD-10-CM | POA: Insufficient documentation

## 2012-02-06 ENCOUNTER — Telehealth: Payer: Self-pay | Admitting: *Deleted

## 2012-02-06 NOTE — Telephone Encounter (Signed)
CALLED PATIENT TO REMIND OF APPTS. FOR 02-07-12, CONFIRMED APPTS. WITH PATIENT.

## 2012-02-07 ENCOUNTER — Ambulatory Visit
Admission: RE | Admit: 2012-02-07 | Discharge: 2012-02-07 | Disposition: A | Discharge status: Home / Self Care | Payer: Medicare Other | Source: Ambulatory Visit | Attending: Radiation Oncology | Admitting: Radiation Oncology

## 2012-02-07 ENCOUNTER — Encounter: Payer: Self-pay | Admitting: Radiation Oncology

## 2012-02-07 ENCOUNTER — Ambulatory Visit: Payer: Self-pay | Admitting: Radiation Oncology

## 2012-02-07 VITALS — BP 167/97 | HR 59 | Temp 97.7°F | Ht 67.0 in | Wt 160.8 lb

## 2012-02-07 DIAGNOSIS — C7982 Secondary malignant neoplasm of genital organs: Secondary | ICD-10-CM | POA: Insufficient documentation

## 2012-02-07 DIAGNOSIS — Z7982 Long term (current) use of aspirin: Secondary | ICD-10-CM | POA: Insufficient documentation

## 2012-02-07 DIAGNOSIS — I959 Hypotension, unspecified: Secondary | ICD-10-CM | POA: Insufficient documentation

## 2012-02-07 DIAGNOSIS — Z510 Encounter for antineoplastic radiation therapy: Secondary | ICD-10-CM | POA: Diagnosis not present

## 2012-02-07 DIAGNOSIS — Z79899 Other long term (current) drug therapy: Secondary | ICD-10-CM | POA: Insufficient documentation

## 2012-02-07 DIAGNOSIS — M549 Dorsalgia, unspecified: Secondary | ICD-10-CM | POA: Diagnosis not present

## 2012-02-07 DIAGNOSIS — R29898 Other symptoms and signs involving the musculoskeletal system: Secondary | ICD-10-CM | POA: Diagnosis not present

## 2012-02-07 DIAGNOSIS — Z90710 Acquired absence of both cervix and uterus: Secondary | ICD-10-CM | POA: Diagnosis not present

## 2012-02-07 DIAGNOSIS — Z8541 Personal history of malignant neoplasm of cervix uteri: Secondary | ICD-10-CM | POA: Insufficient documentation

## 2012-02-07 DIAGNOSIS — C52 Malignant neoplasm of vagina: Secondary | ICD-10-CM | POA: Diagnosis not present

## 2012-02-07 DIAGNOSIS — C539 Malignant neoplasm of cervix uteri, unspecified: Secondary | ICD-10-CM

## 2012-02-07 NOTE — Progress Notes (Signed)
Here for HDR treatment today.  Denies any pain, diarrhea, loose stools, or vaginal bleeding.

## 2012-02-07 NOTE — Progress Notes (Signed)
  Radiation Oncology         [phone] ________________________________  Name: [name] MRN: [id number]  Date: 02/07/2012  DOB: September 15, 1945  Follow-Up Visit Note  Outpatient  Please see Comprehensive note dated 02/07/12 for followup, simulation, treatment planning, and treatment delivery. _____________________________________   Lonie Peak, MD

## 2012-02-07 NOTE — Progress Notes (Signed)
Diagnosis: Vaginal cuff recurrence  Vaginal brachytherapy procedure: The patient was placed in the dorsal lithotomy position at the nursing table. The patient proceeded to undergo a pelvic examination. The patient's vaginal cuff is well-healed without any sign of recurrence. There were no pelvic masses appreciated on pelvic examination. The patient then proceeded to undergo a manual dilation of the vaginal introitus. The patient has been fitted for her vaginal cylinder. The optimal ring arrangement had a diameter of 3 centimeters placed proximally; there is a total of four 3cm rings and one 2.5 cm centimeter ring more distally. The patient tolerated the procedure well with minimal discomfort.  Treatment device: For the patient's therapy she had construction of a custom treatment device. The patient will be treated with a 3 centimeter diameter cylinder, as noted above.  Simulation and treatment planning: The patient was then transferred to the CT simulation suite. Patient had bladder and rectal contrast placed for imaging purposes. The patient's vaginal cylinder was inserted once again. The patient had a fiducial marker in the vaginal cylinder. A CT scan was performed through the pelvic region. The vaginal cylinder appeared to be in good position for treatment. There were no significant air gaps appreciated. The patient had outlining of the bladder, vaginal cylinder, rectum, and rectosigmoid colon.  Treatment plan: The patient proceeded to undergo high-dose rate planning. The patient will be treated with Iridium 192 as the high-dose-rate source. The patient will receive 5  treatments with a dose of 7.75 Gy per fraction to the vaginal mucosal surface. Iridium 192 will be the high-dose-rate source. The patient will be treated twice a week with at least 2 days between treatments. I treating the upper two thirds of the vagina, approximately 5.5 cm of the proximal vagina.  Planning was complete. The patient was  then transferred to the high-dose-rate suite.  Verification simulation:  The patient was placed in the dorsal lithotomy position at the nursing table. The vaginal cylinder was re-inserted. The patient had a fiducial marker placed in the vaginal cylinder. An AP and lateral film was obtained in the treatment position. This is compared to the patient's planning films earlier in the day, documenting good position of the vaginal cylinder for treatment.  High-dose-rate brachytherapy procedure: The patient's vaginal cylinder was then affixed to the remote afterloading afterloading device. The patient proceeded to undergo her first treatment. The patient was prescribed a dose of 7.75Gy the mucosal surface. This is achieved with 1 channel using 12  dwell positions. The treatment time was 450.2 seconds. The patient tolerated the procedure well. After completion of her therapy, a radiation survey was performed, documenting return of the iridium source and the Nucletron safe period the patient will return in 3 days for her second high-dose-rate treatment.  -----------------------------------  Lonie Peak, MD

## 2012-02-07 NOTE — Progress Notes (Signed)
Assisted with placement of HDR Tandem for pending HDR procedure.  [name] tolerated without any difficulty and denied any pain.  Noticed small l streak of blood on tandem.  Given Maxi pad to protect clothing.

## 2012-02-07 NOTE — Progress Notes (Signed)
During simulation foley catheter inserted as ordered by Dr. Basilio Cairo to delineate the bladder because the hardware from the prior hip replacement made it difficult to visualize the bladder properly.

## 2012-02-07 NOTE — Addendum Note (Signed)
Encounter addended by: Delynn Flavin, RN on: 02/07/2012  6:31 PM<BR>     Documentation filed: Charges VN

## 2012-02-07 NOTE — Progress Notes (Signed)
  Radiation Oncology         [phone] ________________________________  Name: [name] MRN: [id number]  Date: 02/07/2012  DOB: 09/06/1945  SIMULATION AND TREATMENT PLANNING NOTE  Please see Comprehensive note dated 02/07/12 for followup, simulation, treatment planning, and treatment delivery.  -----------------------------------  Lonie Peak, MD

## 2012-02-08 NOTE — Addendum Note (Signed)
Encounter addended by: Brithney Bensen Mintz Parks Czajkowski, RN on: 02/08/2012  6:09 PM<BR>     Documentation filed: Charges VN

## 2012-02-10 ENCOUNTER — Ambulatory Visit
Admission: RE | Admit: 2012-02-10 | Discharge: 2012-02-10 | Disposition: A | Discharge status: Home / Self Care | Payer: Medicare Other | Source: Ambulatory Visit | Attending: Radiation Oncology | Admitting: Radiation Oncology

## 2012-02-10 ENCOUNTER — Encounter: Payer: Self-pay | Admitting: Radiation Oncology

## 2012-02-10 DIAGNOSIS — R29898 Other symptoms and signs involving the musculoskeletal system: Secondary | ICD-10-CM | POA: Diagnosis not present

## 2012-02-10 DIAGNOSIS — Z90710 Acquired absence of both cervix and uterus: Secondary | ICD-10-CM | POA: Diagnosis not present

## 2012-02-10 DIAGNOSIS — C52 Malignant neoplasm of vagina: Secondary | ICD-10-CM

## 2012-02-10 DIAGNOSIS — M549 Dorsalgia, unspecified: Secondary | ICD-10-CM | POA: Diagnosis not present

## 2012-02-10 DIAGNOSIS — Z8541 Personal history of malignant neoplasm of cervix uteri: Secondary | ICD-10-CM | POA: Diagnosis not present

## 2012-02-10 DIAGNOSIS — C7982 Secondary malignant neoplasm of genital organs: Secondary | ICD-10-CM | POA: Diagnosis not present

## 2012-02-10 DIAGNOSIS — Z510 Encounter for antineoplastic radiation therapy: Secondary | ICD-10-CM | POA: Diagnosis not present

## 2012-02-10 NOTE — Procedures (Signed)
  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  The cylinder was fabricated today using the planned sequence of cylinders.  It was placed with a device cover and lubricant uneventfully.  It was anchored on the brachytherapy stantion.  SIMULATION VERIFICATION: An AP and Lateral image was obtained to verify device position and geometry.  The position was verified.  After careful review, I approved the simulation film.     NARRATIVE: The HDR brachytherapy afterloader catheter was carefully connected to the vaginal device.  All catheter numbers and catheter connections were checked by physics and me.  The brachytherapy treatment was delivered under my personal supervision uneventfully.  Then the transfer tubing was disconnected and the vaginal cylinder was removed.  DOSIMETRY: Based on the current source strength of 6.62 Ci, 462.3 seconds was calculated to deliver the planned dose of 7.75 Gy to the vaginal surface.  DISPOSITION: Treatment was delivered uneventfully. Radioactive source was confirmed to have returned to the afterloader.  The patient tolerated treatment well and will return as scheduled for their next fraction and/or follow up visit.   _______________________________  Artist Pais. Kathrynn Running, M.D.

## 2012-02-14 ENCOUNTER — Ambulatory Visit
Admission: RE | Admit: 2012-02-14 | Discharge: 2012-02-14 | Disposition: A | Discharge status: Home / Self Care | Payer: Medicare Other | Source: Ambulatory Visit | Attending: Radiation Oncology | Admitting: Radiation Oncology

## 2012-02-14 DIAGNOSIS — C52 Malignant neoplasm of vagina: Secondary | ICD-10-CM | POA: Diagnosis not present

## 2012-02-14 DIAGNOSIS — Z90710 Acquired absence of both cervix and uterus: Secondary | ICD-10-CM | POA: Diagnosis not present

## 2012-02-14 DIAGNOSIS — C7982 Secondary malignant neoplasm of genital organs: Secondary | ICD-10-CM | POA: Diagnosis not present

## 2012-02-14 DIAGNOSIS — Z510 Encounter for antineoplastic radiation therapy: Secondary | ICD-10-CM | POA: Diagnosis not present

## 2012-02-14 DIAGNOSIS — Z8541 Personal history of malignant neoplasm of cervix uteri: Secondary | ICD-10-CM | POA: Diagnosis not present

## 2012-02-14 DIAGNOSIS — M549 Dorsalgia, unspecified: Secondary | ICD-10-CM | POA: Diagnosis not present

## 2012-02-14 DIAGNOSIS — R29898 Other symptoms and signs involving the musculoskeletal system: Secondary | ICD-10-CM | POA: Diagnosis not present

## 2012-02-14 NOTE — Progress Notes (Signed)
Diagnosis: Vaginal Cuff Recurrence  Vaginal brachytherapy procedure: The patient was placed on the high- dose rate treatment table in the dorsal lithotomy position. The patient then proceeded to undergo serial dilatation of the vaginal introitus. The patient's previously constructed vaginal cylinder was then placed in the proximal vagina. The cylinder was then affixed to the CT/MR stabilization plate to prevent slippage. The patient tolerated the procedure well.   Verification simulation: The patient then had a fiducial marker placed within the vaginal cylinder. An AP and lateral film was obtained. This was compared to the patient's planning films showing good position of the vaginal cylinder for treatment.   High-dose rate treatment: The patient then had attachment of her vaginal cylinder to the remote afterloading device. The patient then proceeded to undergo her 3rd of 5 high-dose rate treatment directed at the proximal vagina. The patient was prescribed a dose of 7.75 Gy to be delivered to the mucosal surface. This was achieved with a total dwell time of 480.1 seconds. The patient was treated with 12 dwell positions using 1 catheter. Iridium 192 was the high-dose rate source.  After completion of the patient's therapy a radiation survey was performed documenting return of the iridium source into the Nucletron safe.  -----------------------------------  Jade Peak, MD

## 2012-02-17 ENCOUNTER — Ambulatory Visit
Admission: RE | Admit: 2012-02-17 | Discharge: 2012-02-17 | Disposition: A | Discharge status: Home / Self Care | Payer: Medicare Other | Source: Ambulatory Visit | Attending: Radiation Oncology | Admitting: Radiation Oncology

## 2012-02-17 DIAGNOSIS — M549 Dorsalgia, unspecified: Secondary | ICD-10-CM | POA: Diagnosis not present

## 2012-02-17 DIAGNOSIS — C7982 Secondary malignant neoplasm of genital organs: Secondary | ICD-10-CM | POA: Diagnosis not present

## 2012-02-17 DIAGNOSIS — R29898 Other symptoms and signs involving the musculoskeletal system: Secondary | ICD-10-CM | POA: Diagnosis not present

## 2012-02-17 DIAGNOSIS — Z510 Encounter for antineoplastic radiation therapy: Secondary | ICD-10-CM | POA: Diagnosis not present

## 2012-02-17 DIAGNOSIS — C52 Malignant neoplasm of vagina: Secondary | ICD-10-CM

## 2012-02-17 DIAGNOSIS — Z90710 Acquired absence of both cervix and uterus: Secondary | ICD-10-CM | POA: Diagnosis not present

## 2012-02-17 DIAGNOSIS — Z8541 Personal history of malignant neoplasm of cervix uteri: Secondary | ICD-10-CM | POA: Diagnosis not present

## 2012-02-17 NOTE — Progress Notes (Signed)
Diagnosis: Vaginal Cuff Recurrence   Vaginal brachytherapy procedure: The patient was placed on the high- dose rate treatment table in the dorsal lithotomy position. The patient then proceeded to undergo serial dilatation of the vaginal introitus. The patient's previously constructed vaginal cylinder was then placed in the proximal vagina. The cylinder was then affixed to the CT/MR stabilization plate to prevent slippage. The patient tolerated the procedure well.   Verification simulation: The patient then had a fiducial marker placed within the vaginal cylinder. An AP and lateral film was obtained. This was compared to the patient's planning films showing good position of the vaginal cylinder for treatment.   High-dose rate treatment: The patient then had attachment of her vaginal cylinder to the remote afterloading device. The patient then proceeded to undergo her 4th of 5 high-dose rate treatment directed at the proximal vagina. The patient was prescribed a dose of 7.75 Gy to be delivered to the mucosal surface. This was achieved with a total dwell time of  494.0 seconds. The patient was treated with 12 dwell positions using 1 catheter. Iridium 192 was the high-dose rate source. After completion of the patient's therapy a radiation survey was performed documenting return of the iridium source into the Nucletron safe.  -----------------------------------  Lonie Peak, MD

## 2012-02-21 ENCOUNTER — Other Ambulatory Visit: Payer: Self-pay | Admitting: Radiation Oncology

## 2012-02-21 ENCOUNTER — Ambulatory Visit
Admission: RE | Admit: 2012-02-21 | Discharge: 2012-02-21 | Disposition: A | Discharge status: Home / Self Care | Payer: Medicare Other | Source: Ambulatory Visit | Attending: Radiation Oncology | Admitting: Radiation Oncology

## 2012-02-21 DIAGNOSIS — R29898 Other symptoms and signs involving the musculoskeletal system: Secondary | ICD-10-CM | POA: Diagnosis not present

## 2012-02-21 DIAGNOSIS — Z90710 Acquired absence of both cervix and uterus: Secondary | ICD-10-CM | POA: Diagnosis not present

## 2012-02-21 DIAGNOSIS — Z923 Personal history of irradiation: Secondary | ICD-10-CM

## 2012-02-21 DIAGNOSIS — C7982 Secondary malignant neoplasm of genital organs: Secondary | ICD-10-CM | POA: Diagnosis not present

## 2012-02-21 DIAGNOSIS — Z510 Encounter for antineoplastic radiation therapy: Secondary | ICD-10-CM | POA: Diagnosis not present

## 2012-02-21 DIAGNOSIS — C52 Malignant neoplasm of vagina: Secondary | ICD-10-CM | POA: Diagnosis not present

## 2012-02-21 DIAGNOSIS — Z8541 Personal history of malignant neoplasm of cervix uteri: Secondary | ICD-10-CM | POA: Diagnosis not present

## 2012-02-21 DIAGNOSIS — M549 Dorsalgia, unspecified: Secondary | ICD-10-CM | POA: Diagnosis not present

## 2012-02-21 HISTORY — DX: Personal history of irradiation: Z92.3

## 2012-02-21 NOTE — Progress Notes (Signed)
   Weekly Management Note:  Outpatient Current Fraction - Seen Before Fraction 5 of 5    Narrative:  The patient presents for routine under treatment assessment.  CBCT/MVCT images/Port film x-rays were reviewed.  The chart was checked. She is doing well without complaints.  Physical Findings: Vaginal introitus was serial dilated without difficulty for HDR treatment.  Impression:  The patient is tolerating radiotherapy.  Plan:  Continue radiotherapy as planned. Patient will follow-up with me in 2-3 weeks thereafter.   Diagnosis: Vaginal Cuff Recurrence   Vaginal brachytherapy procedure: The patient was placed on the high- dose rate treatment table in the dorsal lithotomy position. The patient then proceeded to undergo serial dilatation of the vaginal introitus. The patient's previously constructed vaginal cylinder was then placed in the proximal vagina. The cylinder was then affixed to the CT/MR stabilization plate to prevent slippage. The patient tolerated the procedure well.   Verification simulation: The patient then had a fiducial marker placed within the vaginal cylinder. An AP and lateral film was obtained. This was compared to the patient's planning films showing good position of the vaginal cylinder for treatment.   High-dose rate treatment: The patient then had attachment of her vaginal cylinder to the remote afterloading device. The patient then proceeded to undergo her 5th of 5 high-dose rate treatment directed at the proximal vagina. The patient was prescribed a dose of 7.75 Gy to be delivered to the mucosal surface. This was achieved with a total dwell time of 513.0 seconds. The patient was treated with 12 dwell positions using 1 catheter. Iridium 192 was the high-dose rate source. After completion of the patient's therapy a radiation survey was performed documenting return of the iridium source into the Nucletron safe.   ________________________________   Lonie Peak,  M.D.

## 2012-02-23 DIAGNOSIS — K219 Gastro-esophageal reflux disease without esophagitis: Secondary | ICD-10-CM | POA: Diagnosis not present

## 2012-02-23 DIAGNOSIS — R109 Unspecified abdominal pain: Secondary | ICD-10-CM | POA: Diagnosis not present

## 2012-02-23 DIAGNOSIS — K5900 Constipation, unspecified: Secondary | ICD-10-CM | POA: Diagnosis not present

## 2012-02-24 ENCOUNTER — Telehealth: Payer: Self-pay | Admitting: *Deleted

## 2012-02-24 NOTE — Telephone Encounter (Signed)
Talked with [name] at 0615 on 02/23/12.  She reported that she went to her PC, Dr. Mardelle Matte, because her "bowels were stopped up" and she did not feel right".  A urine specimen was obtained and she was given a prescription Cipro and given Miralax BID to alleviate her constipation.  She was also given Zantac for Acid Reflux and is taking Gas-X for bloating.  She reported that her vitals were stable and she was afebrile.   11:31am. Today she reports no change in bowel pattern yet with Miralax.   She continues to have the same vaginal drainage she has been experiencing throughout treatment and she denies any pain in the pelvic/vaginal region.  She also denies any fever today.  Will Call her on Monday.

## 2012-03-02 ENCOUNTER — Encounter (HOSPITAL_COMMUNITY): Payer: Self-pay | Admitting: Emergency Medicine

## 2012-03-02 ENCOUNTER — Emergency Department (HOSPITAL_COMMUNITY): Payer: Medicare Other

## 2012-03-02 ENCOUNTER — Inpatient Hospital Stay (HOSPITAL_COMMUNITY)
Admission: EM | Admit: 2012-03-02 | Discharge: 2012-03-06 | DRG: 445 | Disposition: A | Discharge status: Home / Self Care | Payer: Medicare Other | Attending: Internal Medicine | Admitting: Internal Medicine

## 2012-03-02 DIAGNOSIS — I2510 Atherosclerotic heart disease of native coronary artery without angina pectoris: Secondary | ICD-10-CM | POA: Diagnosis present

## 2012-03-02 DIAGNOSIS — J4489 Other specified chronic obstructive pulmonary disease: Secondary | ICD-10-CM | POA: Diagnosis present

## 2012-03-02 DIAGNOSIS — Z87891 Personal history of nicotine dependence: Secondary | ICD-10-CM

## 2012-03-02 DIAGNOSIS — R932 Abnormal findings on diagnostic imaging of liver and biliary tract: Secondary | ICD-10-CM

## 2012-03-02 DIAGNOSIS — Q619 Cystic kidney disease, unspecified: Secondary | ICD-10-CM

## 2012-03-02 DIAGNOSIS — I779 Disorder of arteries and arterioles, unspecified: Secondary | ICD-10-CM | POA: Diagnosis present

## 2012-03-02 DIAGNOSIS — K2970 Gastritis, unspecified, without bleeding: Secondary | ICD-10-CM | POA: Diagnosis present

## 2012-03-02 DIAGNOSIS — M549 Dorsalgia, unspecified: Secondary | ICD-10-CM | POA: Diagnosis present

## 2012-03-02 DIAGNOSIS — R1013 Epigastric pain: Secondary | ICD-10-CM

## 2012-03-02 DIAGNOSIS — R52 Pain, unspecified: Secondary | ICD-10-CM

## 2012-03-02 DIAGNOSIS — N2889 Other specified disorders of kidney and ureter: Secondary | ICD-10-CM

## 2012-03-02 DIAGNOSIS — K859 Acute pancreatitis without necrosis or infection, unspecified: Secondary | ICD-10-CM

## 2012-03-02 DIAGNOSIS — M7989 Other specified soft tissue disorders: Secondary | ICD-10-CM | POA: Diagnosis not present

## 2012-03-02 DIAGNOSIS — F319 Bipolar disorder, unspecified: Secondary | ICD-10-CM

## 2012-03-02 DIAGNOSIS — J441 Chronic obstructive pulmonary disease with (acute) exacerbation: Secondary | ICD-10-CM | POA: Diagnosis present

## 2012-03-02 DIAGNOSIS — I10 Essential (primary) hypertension: Secondary | ICD-10-CM

## 2012-03-02 DIAGNOSIS — Z79899 Other long term (current) drug therapy: Secondary | ICD-10-CM

## 2012-03-02 DIAGNOSIS — F419 Anxiety disorder, unspecified: Secondary | ICD-10-CM

## 2012-03-02 DIAGNOSIS — F1021 Alcohol dependence, in remission: Secondary | ICD-10-CM | POA: Diagnosis present

## 2012-03-02 DIAGNOSIS — Z7982 Long term (current) use of aspirin: Secondary | ICD-10-CM

## 2012-03-02 DIAGNOSIS — C52 Malignant neoplasm of vagina: Secondary | ICD-10-CM | POA: Diagnosis present

## 2012-03-02 DIAGNOSIS — K838 Other specified diseases of biliary tract: Principal | ICD-10-CM | POA: Diagnosis present

## 2012-03-02 DIAGNOSIS — R609 Edema, unspecified: Secondary | ICD-10-CM | POA: Diagnosis present

## 2012-03-02 DIAGNOSIS — F411 Generalized anxiety disorder: Secondary | ICD-10-CM

## 2012-03-02 DIAGNOSIS — R1011 Right upper quadrant pain: Secondary | ICD-10-CM | POA: Diagnosis not present

## 2012-03-02 DIAGNOSIS — R0902 Hypoxemia: Secondary | ICD-10-CM | POA: Diagnosis present

## 2012-03-02 DIAGNOSIS — I252 Old myocardial infarction: Secondary | ICD-10-CM

## 2012-03-02 DIAGNOSIS — R935 Abnormal findings on diagnostic imaging of other abdominal regions, including retroperitoneum: Secondary | ICD-10-CM | POA: Diagnosis present

## 2012-03-02 DIAGNOSIS — F1020 Alcohol dependence, uncomplicated: Secondary | ICD-10-CM

## 2012-03-02 DIAGNOSIS — I739 Peripheral vascular disease, unspecified: Secondary | ICD-10-CM | POA: Diagnosis present

## 2012-03-02 DIAGNOSIS — J449 Chronic obstructive pulmonary disease, unspecified: Secondary | ICD-10-CM

## 2012-03-02 DIAGNOSIS — N281 Cyst of kidney, acquired: Secondary | ICD-10-CM | POA: Diagnosis not present

## 2012-03-02 HISTORY — DX: Epigastric pain: R10.13

## 2012-03-02 HISTORY — DX: Emphysema, unspecified: J43.9

## 2012-03-02 HISTORY — DX: Other specified disorders of kidney and ureter: N28.89

## 2012-03-02 LAB — URINE MICROSCOPIC-ADD ON

## 2012-03-02 LAB — COMPREHENSIVE METABOLIC PANEL
Albumin: 3.7 g/dL (ref 3.5–5.2)
Alkaline Phosphatase: 107 U/L (ref 39–117)
BUN: 13 mg/dL (ref 6–23)
Calcium: 10.1 mg/dL (ref 8.4–10.5)
Potassium: 3.5 mEq/L (ref 3.5–5.1)
Total Protein: 7.8 g/dL (ref 6.0–8.3)

## 2012-03-02 LAB — CBC WITH DIFFERENTIAL/PLATELET
Basophils Relative: 1 % (ref 0–1)
Eosinophils Absolute: 0.1 10*3/uL (ref 0.0–0.7)
Eosinophils Relative: 2 % (ref 0–5)
Hemoglobin: 13.2 g/dL (ref 12.0–15.0)
MCH: 31.3 pg (ref 26.0–34.0)
MCHC: 34.5 g/dL (ref 30.0–36.0)
MCV: 90.8 fL (ref 78.0–100.0)
Monocytes Relative: 14 % — ABNORMAL HIGH (ref 3–12)
Neutrophils Relative %: 55 % (ref 43–77)
Platelets: 197 10*3/uL (ref 150–400)

## 2012-03-02 LAB — URINALYSIS, ROUTINE W REFLEX MICROSCOPIC
Nitrite: NEGATIVE
Specific Gravity, Urine: 1.014 (ref 1.005–1.030)
Urobilinogen, UA: 0.2 mg/dL (ref 0.0–1.0)
pH: 5.5 (ref 5.0–8.0)

## 2012-03-02 LAB — LIPASE, BLOOD: Lipase: 66 U/L — ABNORMAL HIGH (ref 11–59)

## 2012-03-02 MED ORDER — POTASSIUM CHLORIDE IN NACL 20-0.9 MEQ/L-% IV SOLN
INTRAVENOUS | Status: AC
  Administered 2012-03-02: 23:00:00 via INTRAVENOUS
  Filled 2012-03-02: qty 1000

## 2012-03-02 MED ORDER — MORPHINE SULFATE 4 MG/ML IJ SOLN
4.0000 mg | Freq: Once | INTRAMUSCULAR | Status: AC
  Administered 2012-03-02: 4 mg via INTRAVENOUS
  Filled 2012-03-02: qty 1

## 2012-03-02 MED ORDER — ARIPIPRAZOLE 15 MG PO TABS
15.0000 mg | ORAL_TABLET | Freq: Every day | ORAL | Status: DC
  Administered 2012-03-03: 15 mg via ORAL
  Filled 2012-03-02 (×2): qty 1

## 2012-03-02 MED ORDER — LAMOTRIGINE 200 MG PO TABS
200.0000 mg | ORAL_TABLET | Freq: Two times a day (BID) | ORAL | Status: DC
  Administered 2012-03-02 – 2012-03-03 (×3): 200 mg via ORAL
  Filled 2012-03-02 (×5): qty 1

## 2012-03-02 MED ORDER — ASPIRIN EC 81 MG PO TBEC
81.0000 mg | DELAYED_RELEASE_TABLET | Freq: Every day | ORAL | Status: DC
  Administered 2012-03-03 – 2012-03-06 (×3): 81 mg via ORAL
  Filled 2012-03-02 (×4): qty 1

## 2012-03-02 MED ORDER — MORPHINE SULFATE 4 MG/ML IJ SOLN
4.0000 mg | INTRAMUSCULAR | Status: DC | PRN
  Administered 2012-03-02 – 2012-03-04 (×9): 4 mg via INTRAVENOUS
  Filled 2012-03-02 (×10): qty 1

## 2012-03-02 MED ORDER — HYDROCODONE-ACETAMINOPHEN 5-325 MG PO TABS
1.0000 | ORAL_TABLET | ORAL | Status: DC | PRN
  Administered 2012-03-03: 1 via ORAL
  Administered 2012-03-04 – 2012-03-06 (×9): 2 via ORAL
  Filled 2012-03-02 (×8): qty 2
  Filled 2012-03-02: qty 1

## 2012-03-02 MED ORDER — ONDANSETRON HCL 4 MG PO TABS: 4.0000 mg | ORAL_TABLET | Freq: Four times a day (QID) | ORAL | Status: DC | PRN

## 2012-03-02 MED ORDER — ONDANSETRON HCL 4 MG/2ML IJ SOLN: 4.0000 mg | Freq: Four times a day (QID) | INTRAMUSCULAR | Status: DC | PRN

## 2012-03-02 MED ORDER — CLONIDINE HCL 0.2 MG PO TABS
0.2000 mg | ORAL_TABLET | Freq: Two times a day (BID) | ORAL | Status: DC
  Administered 2012-03-02 – 2012-03-03 (×3): 0.2 mg via ORAL
  Filled 2012-03-02 (×5): qty 1

## 2012-03-02 MED ORDER — CLONAZEPAM 1 MG PO TABS
1.0000 mg | ORAL_TABLET | Freq: Once | ORAL | Status: AC
  Administered 2012-03-02: 1 mg via ORAL
  Filled 2012-03-02: qty 1

## 2012-03-02 MED ORDER — ONDANSETRON HCL 4 MG/2ML IJ SOLN: 4.0000 mg | Freq: Three times a day (TID) | INTRAMUSCULAR | Status: AC | PRN

## 2012-03-02 MED ORDER — ALUM & MAG HYDROXIDE-SIMETH 200-200-20 MG/5ML PO SUSP: 30.0000 mL | Freq: Four times a day (QID) | ORAL | Status: DC | PRN

## 2012-03-02 MED ORDER — ONDANSETRON HCL 4 MG/2ML IJ SOLN
4.0000 mg | Freq: Once | INTRAMUSCULAR | Status: AC
  Administered 2012-03-02: 4 mg via INTRAVENOUS
  Filled 2012-03-02: qty 2

## 2012-03-02 MED ORDER — IOHEXOL 300 MG/ML  SOLN
100.0000 mL | Freq: Once | INTRAMUSCULAR | Status: AC | PRN
  Administered 2012-03-02: 80 mL via INTRAVENOUS

## 2012-03-02 MED ORDER — SERTRALINE HCL 50 MG PO TABS
50.0000 mg | ORAL_TABLET | Freq: Every day | ORAL | Status: DC
  Administered 2012-03-03: 50 mg via ORAL
  Filled 2012-03-02 (×2): qty 1

## 2012-03-02 MED ORDER — SODIUM CHLORIDE 0.9 % IV SOLN: INTRAVENOUS | Status: DC

## 2012-03-02 MED ORDER — SODIUM CHLORIDE 0.9 % IV SOLN
Freq: Once | INTRAVENOUS | Status: AC
  Administered 2012-03-02: 18:00:00 via INTRAVENOUS

## 2012-03-02 NOTE — ED Notes (Signed)
Notified Fleet Contras, RN of abnormal BP and plan for monitoring

## 2012-03-02 NOTE — ED Provider Notes (Signed)
History     CSN: [id number]  Arrival date & time 03/02/12  1353   First MD Initiated Contact with Patient 03/02/12 1611      Chief Complaint  Patient presents with  . Abdominal Pain    (Consider location/radiation/quality/duration/timing/severity/associated sxs/prior treatment) Patient is a 67 y.o. female presenting with abdominal pain. The history is provided by the patient.  Abdominal Pain She is complaining of right upper quadrant pain for the last week. It is getting worse. She describes it as a squeezing feeling on the inside and she rates pain at 10/10. It is worse when she bends over and better she stands up. It is not affected by eating or bowel movements. She did see her PCP who gave her medication for constipation and she has had loose stools since then. Pain is not affected by bowel movements. She denies fever, chills, sweats. She denies radiation of pain.  Past Medical History  Diagnosis Date  . Hypertension   . Depression   . Alcoholism     recovering since 2000  . PONV (postoperative nausea and vomiting)   . Other and unspecified general anesthetics causing adverse effect in therapeutic use post op delirium--  last anes record w/ chart  from   09-22-2009 (spinal w/ light sedation)  . Coronary artery disease CARDIOLOGIST- DR [place]--- LAST VISIT NOTE 09-07-2009  W/ CHART  . Inferior MI 2000--  POST PTCA W/ STENT X1  . Bipolar disorder   . Peripheral vascular disease POST RIGHT CAROTID SURG.  1995  . Status post carotid endarterectomy RIGHT --  1995  . Status post primary angioplasty with coronary stent 2000--  POST INFERIOR MI  . Blood transfusion   . Normal cardiac stress test 2008-- PER DR [place] NOTE 09-07-2009  . Arthritis BACK  . Chronic back pain   . Osteoporosis   . Rosacea LEFT FACIAL RASH  . Idiopathic acute facial nerve palsy LEFT SIDE--  BOTOX THERAPY  . Blepharospasm LEFT EYE  . History of alcohol abuse RECOVERING SINCE 2000  . Anxiety   . Unstable  balance WALKS W/ CANE  . HTN (hypertension)   . Vaginal cancer   . Cervical cancer 12/28/11     vagina upper right bx==squamous cell ca    Past Surgical History  Procedure Laterality Date  . Total knee arthroplasty  09-22-2009    RIGHT  . Vaginal hysterectomy  07-06-2009  . Hemiarthroplasty hip  12-26-2008    LEFT FEMORAL NECK FX  . Total hip arthroplasty  04-15-2008    POST FAILED  RIGHT HIP ORIF FEMORAL FX  . Cervical conization w/bx  09-23-2008  . Orif hip fracture  02-13-2007    RIGHT FEMORAL NECK FX  . Right shoulder surg.  2007  . Coronary angioplasty with stent placement  2000-   INFERIOR MI    X1 STENT TO RCA  . Carotid endarterectomy  1995    RIGHT  . Cataract extraction w/ intraocular lens  implant, bilateral    . Orif right distal radius and right proximal humerous neck fx's  10-10-2005  . Upper right vaginal region  12/28/11    BIOPSY: SQUAMOUS CELL CARCINOMA    Family History  Problem Relation Age of Onset  . Hypertension Mother   . Hypertension Father     History  Substance Use Topics  . Smoking status: Former Smoker -- 2.00 packs/day for 50 years    Types: Cigarettes    Quit date: 01/18/2011  . Smokeless tobacco: Never  Used     Comment: STATES QUIT SMOKING 01-18-2011  . Alcohol Use: No     Comment: RECOVERING ALCOHOLIC--   QUIT IN 2000    OB History   Grav Para Term Preterm Abortions TAB SAB Ect Mult Living                  Review of Systems  Cardiovascular: Positive for leg swelling.  Gastrointestinal: Positive for abdominal pain.  All other systems reviewed and are negative.    Allergies  Review of patient's allergies indicates no known allergies.  Home Medications   Current Outpatient Rx  Name  Route  Sig  Dispense  Refill  . ARIPiprazole (ABILIFY) 15 MG tablet   Oral   Take 15 mg by mouth daily.          Marland Kitchen aspirin EC 81 MG tablet   Oral   Take 81 mg by mouth daily.         . calcium-vitamin D (OSCAL WITH D) 500-200  MG-UNIT per tablet   Oral   Take 1 tablet by mouth daily.          . Cholecalciferol (VITAMIN D3) 2000 UNITS TABS   Oral   Take 1 capsule by mouth daily.          . clonazePAM (KLONOPIN) 1 MG tablet   Oral   Take 1 mg by mouth 3 (three) times daily as needed for anxiety.          . cloNIDine (CATAPRES) 0.2 MG tablet   Oral   Take 0.2 mg by mouth 2 (two) times daily.          Marland Kitchen lamoTRIgine (LAMICTAL) 100 MG tablet   Oral   Take 200 mg by mouth 2 (two) times daily.          . Multiple Vitamin (MULTIVITAMIN WITH MINERALS) TABS   Oral   Take 1 tablet by mouth daily.         Marland Kitchen olmesartan-hydrochlorothiazide (BENICAR HCT) 40-25 MG per tablet   Oral   Take 1 tablet by mouth daily.         . sertraline (ZOLOFT) 50 MG tablet   Oral   Take 50 mg by mouth daily.          . simvastatin (ZOCOR) 10 MG tablet   Oral   Take 10 mg by mouth at bedtime.          Marland Kitchen esomeprazole (NEXIUM) 20 MG capsule   Oral   Take 20 mg by mouth daily before breakfast.           BP 156/77  Pulse 58  Temp(Src) 97.9 F (36.6 C) (Oral)  Ht 5' 6.5" (1.689 m)  Wt 160 lb (72.576 kg)  BMI 25.44 kg/m2  SpO2 96%  Physical Exam  Nursing note and vitals reviewed.  67 year old female, resting comfortably and in no acute distress. Vital signs are significant for hypertension with blood pressure 136/77, bradycardia with heart rate of 58. Oxygen saturation is 96%, which is normal. Head is normocephalic and atraumatic. PERRLA, EOMI. Oropharynx is clear. Neck is nontender and supple without adenopathy or JVD. Back is nontender and there is no CVA tenderness. Lungs are clear without rales, wheezes, or rhonchi. Chest is nontender. Heart has regular rate and rhythm without murmur. Abdomen is soft, flat, with mild right upper quadrant tenderness. There is no rebound and no guarding and there is a negative Murphy sign. There are no  masses or hepatosplenomegaly and peristalsis is  normoactive. Extremities have 2 edema, full range of motion is present. Skin is warm and dry without rash. Neurologic: Mental status is normal, cranial nerves are intact except for a right peripheral seventh nerve palsy, there are no motor or sensory deficits.  ED Course  Procedures (including critical care time)  Results for orders placed during the hospital encounter of 03/02/12  CBC WITH DIFFERENTIAL      Result Value Range   WBC 4.2  4.0 - 10.5 K/uL   RBC 4.22  3.87 - 5.11 MIL/uL   Hemoglobin 13.2  12.0 - 15.0 g/dL   HCT 16.1  09.6 - 04.5 %   MCV 90.8  78.0 - 100.0 fL   MCH 31.3  26.0 - 34.0 pg   MCHC 34.5  30.0 - 36.0 g/dL   RDW 40.9  81.1 - 91.4 %   Platelets 197  150 - 400 K/uL   Neutrophils Relative 55  43 - 77 %   Neutro Abs 2.3  1.7 - 7.7 K/uL   Lymphocytes Relative 29  12 - 46 %   Lymphs Abs 1.2  0.7 - 4.0 K/uL   Monocytes Relative 14 (*) 3 - 12 %   Monocytes Absolute 0.6  0.1 - 1.0 K/uL   Eosinophils Relative 2  0 - 5 %   Eosinophils Absolute 0.1  0.0 - 0.7 K/uL   Basophils Relative 1  0 - 1 %   Basophils Absolute 0.0  0.0 - 0.1 K/uL  COMPREHENSIVE METABOLIC PANEL      Result Value Range   Sodium 135  135 - 145 mEq/L   Potassium 3.5  3.5 - 5.1 mEq/L   Chloride 95 (*) 96 - 112 mEq/L   CO2 27  19 - 32 mEq/L   Glucose, Bld 97  70 - 99 mg/dL   BUN 13  6 - 23 mg/dL   Creatinine, Ser 7.82  0.50 - 1.10 mg/dL   Calcium 95.6  8.4 - 21.3 mg/dL   Total Protein 7.8  6.0 - 8.3 g/dL   Albumin 3.7  3.5 - 5.2 g/dL   AST 21  0 - 37 U/L   ALT 16  0 - 35 U/L   Alkaline Phosphatase 107  39 - 117 U/L   Total Bilirubin 0.3  0.3 - 1.2 mg/dL   GFR calc non Af Amer 86 (*) >90 mL/min   GFR calc Af Amer >90  >90 mL/min  LIPASE, BLOOD      Result Value Range   Lipase 66 (*) 11 - 59 U/L  URINALYSIS, ROUTINE W REFLEX MICROSCOPIC      Result Value Range   Color, Urine YELLOW  YELLOW   APPearance CLEAR  CLEAR   Specific Gravity, Urine 1.014  1.005 - 1.030   pH 5.5  5.0 - 8.0    Glucose, UA NEGATIVE  NEGATIVE mg/dL   Hgb urine dipstick NEGATIVE  NEGATIVE   Bilirubin Urine NEGATIVE  NEGATIVE   Ketones, ur NEGATIVE  NEGATIVE mg/dL   Protein, ur NEGATIVE  NEGATIVE mg/dL   Urobilinogen, UA 0.2  0.0 - 1.0 mg/dL   Nitrite NEGATIVE  NEGATIVE   Leukocytes, UA TRACE (*) NEGATIVE  URINE MICROSCOPIC-ADD ON      Result Value Range   Squamous Epithelial / LPF FEW (*) RARE   WBC, UA 0-2  <3 WBC/hpf   Bacteria, UA FEW (*) RARE   Urine-Other MUCOUS PRESENT     Ct  Abdomen Pelvis W Contrast  03/02/2012  *RADIOLOGY REPORT*  Clinical Data: Abdominal pain  CT ABDOMEN AND PELVIS WITH CONTRAST  Technique:  Multidetector CT imaging of the abdomen and pelvis was performed following the standard protocol during bolus administration of intravenous contrast.  Contrast: 80mL OMNIPAQUE IOHEXOL 300 MG/ML  SOLN  Comparison: 01/12/2012  Findings: The lung bases appear clear.  No pericardial or pleural effusion.  Interstitial change of COPD is identified.  Prominent calcifications involving the coronary arteries noted.  No focal liver abnormality noted.  The gallbladder appears normal. There is mild intrahepatic biliary prominence.  The common bile duct measures up to 7 mm. The pancreatic duct measures up to 4 mm, image 28.  This is new since the previous exam.  The spleen is normal.  There is a small cyst within the upper pole of the right kidney. Intermediate attenuating cyst arising from the upper pole of the left kidney may have enhancement.  This measures approximately 3.30 cm, image 18.  Hounsfield units equal 35.  On the noncontrast CT from 01/12/2012 the Hounsfield units equal 12.  The lower pelvis is obscured by beam hardening artifact from the patient's bilateral hip arthroplasty device.  There is advanced calcified atherosclerotic change affecting the abdominal aorta and its branches.  No aneurysm noted.  There is no upper abdominal adenopathy.  No pelvic or inguinal adenopathy.  No free fluid or  fluid collections identified within the abdomen or pelvis.  The stomach appears normal.  The small bowel loops have a normal course and caliber without evidence for obstruction.  The appendix is visualized and appears normal.  Proximal colon is normal. Multiple distal colonic diverticula identified.  Review of the visualized osseous structures is significant for osteopenia, scoliosis and mild multilevel spondylosis.  There are compression deformities involving the T11 and L4 vertebra. This is similar to 01/28/2009.  IMPRESSION:  1.  There is mild increased caliber of the intrahepatic ducts and common bile duct.  The pancreatic duct is also slightly increased in caliber from previous exam.  Difficult to exclude choledocholithiasis.  If there is clinical concern for common bile duct obstruction consider correlation with liver function tests. If further imaging is clinically indicated an MRCP would be recommended. 2.  Complicated cyst arising from the upper pole of the left kidney.  When compared with the exam from 01/12/2012 this cyst may exhibit enhancement.  Advise further evaluation with contrast enhanced MRI.   Original Report Authenticated By: Signa Kell, M.D.    Images viewed by me.   1. Pancreatitis       MDM  Right upper quadrant pain of uncertain cause. Pattern does not seem typical of biliary colic. Laboratory workup and CT scan have been ordered.  Lipase is come back mildly elevated and CT shows no evidence of dilatation of the pancreatic duct. This is concerning for common bile duct stone. She's feeling somewhat better after IV morphine and ondansetron. She'll be admitted for ongoing IV hydration as well as some pain control and nausea control. She will need an MRCP for further evaluation which can be obtained tomorrow. Case is discussed with Dr. Onalee Hua of [facility] who agrees to admit her under observation status.      Dione Booze, MD 03/02/12 Jerene Bears

## 2012-03-02 NOTE — H&P (Signed)
PCP:   Maryelizabeth Rowan, MD   Chief Complaint:  abd pain  HPI: 67 yo female with one to two weeks of ruq/epigastric abd pain without any n/v which is not associated with eating.  Pt poor historian.  No fevers.  No diarrhea.  Pain is resolved with tx in ED.  Ct abd shows show dilated ducts and needs mrcp.  Still has gallbladder.  Is requesting to eat.  No cp or sob.  Asked to obs pt overnight as she is a very hard iv stick so we can keep her iv in overnight in order to get mrcp tomorrow.  Review of Systems:  Positive and negative as per HPI otherwise all other systems are negative  Past Medical History: Past Medical History  Diagnosis Date  . Hypertension   . Depression   . Alcoholism     recovering since 2000  . PONV (postoperative nausea and vomiting)   . Other and unspecified general anesthetics causing adverse effect in therapeutic use post op delirium--  last anes record w/ chart  from   09-22-2009 (spinal w/ light sedation)  . Coronary artery disease CARDIOLOGIST- DR [place]--- LAST VISIT NOTE 09-07-2009  W/ CHART  . Inferior MI 2000--  POST PTCA W/ STENT X1  . Bipolar disorder   . Peripheral vascular disease POST RIGHT CAROTID SURG.  1995  . Status post carotid endarterectomy RIGHT --  1995  . Status post primary angioplasty with coronary stent 2000--  POST INFERIOR MI  . Blood transfusion   . Normal cardiac stress test 2008-- PER DR [place] NOTE 09-07-2009  . Arthritis BACK  . Chronic back pain   . Osteoporosis   . Rosacea LEFT FACIAL RASH  . Idiopathic acute facial nerve palsy LEFT SIDE--  BOTOX THERAPY  . Blepharospasm LEFT EYE  . History of alcohol abuse RECOVERING SINCE 2000  . Anxiety   . Unstable balance WALKS W/ CANE  . HTN (hypertension)   . Vaginal cancer   . Cervical cancer 12/28/11     vagina upper right bx==squamous cell ca   Past Surgical History  Procedure Laterality Date  . Total knee arthroplasty  09-22-2009    RIGHT  . Vaginal hysterectomy   07-06-2009  . Hemiarthroplasty hip  12-26-2008    LEFT FEMORAL NECK FX  . Total hip arthroplasty  04-15-2008    POST FAILED  RIGHT HIP ORIF FEMORAL FX  . Cervical conization w/bx  09-23-2008  . Orif hip fracture  02-13-2007    RIGHT FEMORAL NECK FX  . Right shoulder surg.  2007  . Coronary angioplasty with stent placement  2000-   INFERIOR MI    X1 STENT TO RCA  . Carotid endarterectomy  1995    RIGHT  . Cataract extraction w/ intraocular lens  implant, bilateral    . Orif right distal radius and right proximal humerous neck fx's  10-10-2005  . Upper right vaginal region  12/28/11    BIOPSY: SQUAMOUS CELL CARCINOMA    Medications: Prior to Admission medications   Medication Sig Start Date End Date Taking? Authorizing Provider  ARIPiprazole (ABILIFY) 15 MG tablet Take 15 mg by mouth daily.    Yes Historical Provider, MD  aspirin EC 81 MG tablet Take 81 mg by mouth daily.   Yes Historical Provider, MD  calcium-vitamin D (OSCAL WITH D) 500-200 MG-UNIT per tablet Take 1 tablet by mouth daily.    Yes Historical Provider, MD  Cholecalciferol (VITAMIN D3) 2000 UNITS TABS Take 1 capsule  by mouth daily.    Yes Historical Provider, MD  clonazePAM (KLONOPIN) 1 MG tablet Take 1 mg by mouth 3 (three) times daily as needed for anxiety.    Yes Historical Provider, MD  cloNIDine (CATAPRES) 0.2 MG tablet Take 0.2 mg by mouth 2 (two) times daily.    Yes Historical Provider, MD  lamoTRIgine (LAMICTAL) 100 MG tablet Take 200 mg by mouth 2 (two) times daily.    Yes Historical Provider, MD  Multiple Vitamin (MULTIVITAMIN WITH MINERALS) TABS Take 1 tablet by mouth daily.   Yes Historical Provider, MD  olmesartan-hydrochlorothiazide (BENICAR HCT) 40-25 MG per tablet Take 1 tablet by mouth daily.   Yes Historical Provider, MD  sertraline (ZOLOFT) 50 MG tablet Take 50 mg by mouth daily.    Yes Historical Provider, MD  simvastatin (ZOCOR) 10 MG tablet Take 10 mg by mouth at bedtime.    Yes Historical Provider,  MD  esomeprazole (NEXIUM) 20 MG capsule Take 20 mg by mouth daily before breakfast.    Historical Provider, MD    Allergies:  No Known Allergies  Social History:  reports that she quit smoking about 13 months ago. Her smoking use included Cigarettes. She has a 100 pack-year smoking history. She has never used smokeless tobacco. She reports that she does not drink alcohol or use illicit drugs.  Family History: Family History  Problem Relation Age of Onset  . Hypertension Mother   . Hypertension Father     Physical Exam: Filed Vitals:   03/02/12 1611 03/02/12 1715 03/02/12 1753 03/02/12 1832  BP: 192/100 189/107  157/90  Pulse:  71  67  Temp:      TempSrc:      Height:      Weight:      SpO2: 97% 96% 95% 98%   General appearance: alert, cooperative and no distress Neck: no JVD and supple, symmetrical, trachea midline Lungs: clear to auscultation bilaterally Heart: regular rate and rhythm, S1, S2 normal, no murmur, click, rub or gallop Abdomen: soft, non-tender; bowel sounds normal; no masses,  no organomegaly Extremities: edema 2+ble Pulses: 2+ and symmetric Skin: Skin color, texture, turgor normal. No rashes or lesions Neurologic: Grossly normal    Labs on Admission:   Recent Labs  03/02/12 1705  NA 135  K 3.5  CL 95*  CO2 27  GLUCOSE 97  BUN 13  CREATININE 0.73  CALCIUM 10.1    Recent Labs  03/02/12 1705  AST 21  ALT 16  ALKPHOS 107  BILITOT 0.3  PROT 7.8  ALBUMIN 3.7    Recent Labs  03/02/12 1705  LIPASE 66*    Recent Labs  03/02/12 1705  WBC 4.2  NEUTROABS 2.3  HGB 13.2  HCT 38.3  MCV 90.8  PLT 197   Radiological Exams on Admission: Ct Abdomen Pelvis W Contrast  03/02/2012  *RADIOLOGY REPORT*  Clinical Data: Abdominal pain  CT ABDOMEN AND PELVIS WITH CONTRAST  Technique:  Multidetector CT imaging of the abdomen and pelvis was performed following the standard protocol during bolus administration of intravenous contrast.  Contrast:  80mL OMNIPAQUE IOHEXOL 300 MG/ML  SOLN  Comparison: 01/12/2012  Findings: The lung bases appear clear.  No pericardial or pleural effusion.  Interstitial change of COPD is identified.  Prominent calcifications involving the coronary arteries noted.  No focal liver abnormality noted.  The gallbladder appears normal. There is mild intrahepatic biliary prominence.  The common bile duct measures up to 7 mm. The pancreatic duct measures  up to 4 mm, image 28.  This is new since the previous exam.  The spleen is normal.  There is a small cyst within the upper pole of the right kidney. Intermediate attenuating cyst arising from the upper pole of the left kidney may have enhancement.  This measures approximately 3.30 cm, image 18.  Hounsfield units equal 35.  On the noncontrast CT from 01/12/2012 the Hounsfield units equal 12.  The lower pelvis is obscured by beam hardening artifact from the patient's bilateral hip arthroplasty device.  There is advanced calcified atherosclerotic change affecting the abdominal aorta and its branches.  No aneurysm noted.  There is no upper abdominal adenopathy.  No pelvic or inguinal adenopathy.  No free fluid or fluid collections identified within the abdomen or pelvis.  The stomach appears normal.  The small bowel loops have a normal course and caliber without evidence for obstruction.  The appendix is visualized and appears normal.  Proximal colon is normal. Multiple distal colonic diverticula identified.  Review of the visualized osseous structures is significant for osteopenia, scoliosis and mild multilevel spondylosis.  There are compression deformities involving the T11 and L4 vertebra. This is similar to 01/28/2009.  IMPRESSION:  1.  There is mild increased caliber of the intrahepatic ducts and common bile duct.  The pancreatic duct is also slightly increased in caliber from previous exam.  Difficult to exclude choledocholithiasis.  If there is clinical concern for common bile duct  obstruction consider correlation with liver function tests. If further imaging is clinically indicated an MRCP would be recommended. 2.  Complicated cyst arising from the upper pole of the left kidney.  When compared with the exam from 01/12/2012 this cyst may exhibit enhancement.  Advise further evaluation with contrast enhanced MRI.   Original Report Authenticated By: Signa Kell, M.D.     Assessment/Plan  67 yo female with abd pain biliary colic/vs ?early pancreatitis with abd ct scan Principal Problem:   Abnormal abdominal CT scan Active Problems:   Carotid artery disease   Cancer of vaginal vault - recurrent from cervix   Alcoholism   Bipolar disorder   Abdominal pain, acute, epigastric  Full liq diet.  Symptoms have improved.  mrcp in am.  Repeat lft and lipase in am.  Vss.  Med bed.  Jerzy Crotteau A 03/02/2012, 7:19 PM

## 2012-03-02 NOTE — ED Notes (Signed)
Report received from [facility],  Pt is being admitted due to results of CT scan,  Pt is alert and oriented she is from home,  Noted to just finish chemotherapy for vaginal cancer,  Lower leg edema 3 plus reported also,

## 2012-03-02 NOTE — ED Notes (Signed)
Reports seen family MD a week ago today due to abdominal pain. Treated for constipation given miralax & cipro for UTI infection. Since Tuesday has noted blood from rectum occasionally, however, stated worried about her side & points to RUQ. Pain is worse with standing & bending.

## 2012-03-02 NOTE — ED Notes (Signed)
Per RN, Notified Dr. Preston Fleeting of new onset edema.

## 2012-03-03 ENCOUNTER — Inpatient Hospital Stay (HOSPITAL_COMMUNITY): Payer: Medicare Other

## 2012-03-03 ENCOUNTER — Observation Stay (HOSPITAL_COMMUNITY): Payer: Medicare Other

## 2012-03-03 DIAGNOSIS — R935 Abnormal findings on diagnostic imaging of other abdominal regions, including retroperitoneum: Secondary | ICD-10-CM | POA: Diagnosis not present

## 2012-03-03 DIAGNOSIS — I10 Essential (primary) hypertension: Secondary | ICD-10-CM | POA: Diagnosis not present

## 2012-03-03 DIAGNOSIS — R932 Abnormal findings on diagnostic imaging of liver and biliary tract: Secondary | ICD-10-CM

## 2012-03-03 DIAGNOSIS — C52 Malignant neoplasm of vagina: Secondary | ICD-10-CM

## 2012-03-03 DIAGNOSIS — N281 Cyst of kidney, acquired: Secondary | ICD-10-CM | POA: Diagnosis not present

## 2012-03-03 DIAGNOSIS — K859 Acute pancreatitis without necrosis or infection, unspecified: Secondary | ICD-10-CM | POA: Diagnosis not present

## 2012-03-03 DIAGNOSIS — R1013 Epigastric pain: Secondary | ICD-10-CM | POA: Diagnosis not present

## 2012-03-03 DIAGNOSIS — J449 Chronic obstructive pulmonary disease, unspecified: Secondary | ICD-10-CM | POA: Diagnosis not present

## 2012-03-03 LAB — HEPATIC FUNCTION PANEL
ALT: 14 U/L (ref 0–35)
Albumin: 3.1 g/dL — ABNORMAL LOW (ref 3.5–5.2)
Alkaline Phosphatase: 76 U/L (ref 39–117)
Total Protein: 6.2 g/dL (ref 6.0–8.3)

## 2012-03-03 MED ORDER — GADOBENATE DIMEGLUMINE 529 MG/ML IV SOLN
15.0000 mL | Freq: Once | INTRAVENOUS | Status: AC | PRN
  Administered 2012-03-03: 15 mL via INTRAVENOUS

## 2012-03-03 MED ORDER — HEPARIN SODIUM (PORCINE) 5000 UNIT/ML IJ SOLN
5000.0000 [IU] | Freq: Three times a day (TID) | INTRAMUSCULAR | Status: DC
  Administered 2012-03-03 – 2012-03-04 (×3): 5000 [IU] via SUBCUTANEOUS
  Filled 2012-03-03 (×6): qty 1

## 2012-03-03 MED ORDER — PANTOPRAZOLE SODIUM 40 MG IV SOLR
40.0000 mg | Freq: Two times a day (BID) | INTRAVENOUS | Status: DC
  Administered 2012-03-03 – 2012-03-04 (×2): 40 mg via INTRAVENOUS
  Filled 2012-03-03 (×5): qty 40

## 2012-03-03 MED ORDER — CLONAZEPAM 1 MG PO TABS
1.0000 mg | ORAL_TABLET | Freq: Three times a day (TID) | ORAL | Status: DC | PRN
  Administered 2012-03-03 – 2012-03-06 (×6): 1 mg via ORAL
  Filled 2012-03-03 (×6): qty 1

## 2012-03-03 NOTE — Consult Note (Signed)
Referring Provider: Dr. Barnie Del Primary Care Physician:  Cala Bradford, MD Primary Gastroenterologist:  Dr.Patterson  Reason for Consultation:  Acute abdominal pain, abnormal MRCP  HPI: [name] is a 67 y.o. female with multiple medical problems. She has history of coronary artery disease and is status post MI and remote stent to the RCA, has history of peripheral vascular disease with prior carotid endarterectomy, chronic back pain for which she takes narcotics, hypertension, depression, prior history of EtOH abuse abstinent over the past 14 years. She has had several orthopedic procedures done. She had colonoscopy with Dr. Jarold Motto in August of 2010 was found to have severe diverticular disease at that time She has history of vaginal hysterectomy done in 2011 for squamous cell dysplasia, and then more recently was diagnosed with a squamous cell carcinoma of the vagina and has just completed radiation therapy for that. She was admitted yesterday through the emergency room with complaints of 2-3 weeks of epigastric abdominal pain. She says the pain really hasn't gotten worse but has been persistent and is not constant. She does not notice any worsening of pain with by mouth intake, has noticed more pain at night and also with certain positions like bending over. No associated fever or chills no nausea or vomiting no diarrhea melena or hematochezia. Her appetite has been fair.. she does take a regular aspirin at home daily and had also been taking an over-the-counter anti-inflammatory 2 tablets daily. On admission CT of the abdomen and pelvis was done which did show changes of COPD, in her abdomen her gallbladder appears to be normal the common bile duct was mildly dilated at 7 mm and pancreatic duct 4 mm she was noted to have advanced atherosclerotic vascular disease.  Subsequent MRCP was done earlier today showing a 4 x 4.4 cm left renal lesion most consistent with a renal cell carcinoma.  There is no adenopathy. Gallbladder appears normal without stones and common bile duct is 13 mm in the porta hepatis with abrupt tapering just above the ampulla, pancreatic duct not dilated and there is no pancreatic lesion seen. Interestingly her lipase was slightly elevated on admission at 66 and this has normalized. 2 sets of liver tests have been normal. She says her pain is still present but is been well controlled with the pain medicine since admission   Past Medical History  Diagnosis Date  . Hypertension   . Depression   . Alcoholism     recovering since 2000  . PONV (postoperative nausea and vomiting)   . Other and unspecified general anesthetics causing adverse effect in therapeutic use post op delirium--  last anes record w/ chart  from   09-22-2009 (spinal w/ light sedation)  . Coronary artery disease CARDIOLOGIST- DR [place]--- LAST VISIT NOTE 09-07-2009  W/ CHART  . Inferior MI 2000--  POST PTCA W/ STENT X1  . Bipolar disorder   . Peripheral vascular disease POST RIGHT CAROTID SURG.  1995  . Status post carotid endarterectomy RIGHT --  1995  . Status post primary angioplasty with coronary stent 2000--  POST INFERIOR MI  . Blood transfusion   . Normal cardiac stress test 2008-- PER DR [place] NOTE 09-07-2009  . Arthritis BACK  . Chronic back pain   . Osteoporosis   . Rosacea LEFT FACIAL RASH  . Idiopathic acute facial nerve palsy LEFT SIDE--  BOTOX THERAPY  . Blepharospasm LEFT EYE  . History of alcohol abuse RECOVERING SINCE 2000  . Anxiety   .  Unstable balance WALKS W/ CANE  . HTN (hypertension)   . Vaginal cancer   . Cervical cancer 12/28/11     vagina upper right bx==squamous cell ca    Past Surgical History  Procedure Laterality Date  . Total knee arthroplasty  09-22-2009    RIGHT  . Vaginal hysterectomy  07-06-2009  . Hemiarthroplasty hip  12-26-2008    LEFT FEMORAL NECK FX  . Total hip arthroplasty  04-15-2008    POST FAILED  RIGHT HIP ORIF FEMORAL FX  .  Cervical conization w/bx  09-23-2008  . Orif hip fracture  02-13-2007    RIGHT FEMORAL NECK FX  . Right shoulder surg.  2007  . Coronary angioplasty with stent placement  2000-   INFERIOR MI    X1 STENT TO RCA  . Carotid endarterectomy  1995    RIGHT  . Cataract extraction w/ intraocular lens  implant, bilateral    . Orif right distal radius and right proximal humerous neck fx's  10-10-2005  . Upper right vaginal region  12/28/11    BIOPSY: SQUAMOUS CELL CARCINOMA    Prior to Admission medications   Medication Sig Start Date End Date Taking? Authorizing Provider  ARIPiprazole (ABILIFY) 15 MG tablet Take 15 mg by mouth daily.    Yes Historical Provider, MD  aspirin EC 81 MG tablet Take 81 mg by mouth daily.   Yes Historical Provider, MD  calcium-vitamin D (OSCAL WITH D) 500-200 MG-UNIT per tablet Take 1 tablet by mouth daily.    Yes Historical Provider, MD  Cholecalciferol (VITAMIN D3) 2000 UNITS TABS Take 1 capsule by mouth daily.    Yes Historical Provider, MD  clonazePAM (KLONOPIN) 1 MG tablet Take 1 mg by mouth 3 (three) times daily as needed for anxiety.    Yes Historical Provider, MD  cloNIDine (CATAPRES) 0.2 MG tablet Take 0.2 mg by mouth 2 (two) times daily.    Yes Historical Provider, MD  lamoTRIgine (LAMICTAL) 100 MG tablet Take 200 mg by mouth 2 (two) times daily.    Yes Historical Provider, MD  Multiple Vitamin (MULTIVITAMIN WITH MINERALS) TABS Take 1 tablet by mouth daily.   Yes Historical Provider, MD  olmesartan-hydrochlorothiazide (BENICAR HCT) 40-25 MG per tablet Take 1 tablet by mouth daily.   Yes Historical Provider, MD  sertraline (ZOLOFT) 50 MG tablet Take 50 mg by mouth daily.    Yes Historical Provider, MD  simvastatin (ZOCOR) 10 MG tablet Take 10 mg by mouth at bedtime.    Yes Historical Provider, MD  esomeprazole (NEXIUM) 20 MG capsule Take 20 mg by mouth daily before breakfast.    Historical Provider, MD    Current Facility-Administered Medications  Medication  Dose Route Frequency Provider Last Rate Last Dose  . alum & mag hydroxide-simeth (MAALOX/MYLANTA) 200-200-20 MG/5ML suspension 30 mL  30 mL Oral Q6H PRN Tarry Kos, MD      . ARIPiprazole (ABILIFY) tablet 15 mg  15 mg Oral Daily Tarry Kos, MD   15 mg at 03/03/12 1016  . aspirin EC tablet 81 mg  81 mg Oral Daily Tarry Kos, MD   81 mg at 03/03/12 1016  . clonazePAM (KLONOPIN) tablet 1 mg  1 mg Oral TID PRN Osvaldo Shipper, MD   1 mg at 03/03/12 1154  . cloNIDine (CATAPRES) tablet 0.2 mg  0.2 mg Oral BID Tarry Kos, MD   0.2 mg at 03/03/12 1016  . heparin injection 5,000 Units  5,000 Units Subcutaneous Q8H Osvaldo Shipper, MD      .  HYDROcodone-acetaminophen (NORCO/VICODIN) 5-325 MG per tablet 1-2 tablet  1-2 tablet Oral Q4H PRN Tarry Kos, MD   1 tablet at 03/03/12 0204  . lamoTRIgine (LAMICTAL) tablet 200 mg  200 mg Oral BID Tarry Kos, MD   200 mg at 03/03/12 1016  . morphine 4 MG/ML injection 4 mg  4 mg Intravenous Q4H PRN Dione Booze, MD   4 mg at 03/03/12 1154  . ondansetron (ZOFRAN) tablet 4 mg  4 mg Oral Q6H PRN Tarry Kos, MD       Or  . ondansetron [facility]) injection 4 mg  4 mg Intravenous Q6H PRN Tarry Kos, MD      . sertraline (ZOLOFT) tablet 50 mg  50 mg Oral Daily Tarry Kos, MD   50 mg at 03/03/12 1016    Allergies as of 03/02/2012  . (No Known Allergies)    Family History  Problem Relation Age of Onset  . Hypertension Mother   . Hypertension Father     History   Social History  . Marital Status: Divorced    Spouse Name: N/A    Number of Children: N/A  . Years of Education: N/A   Occupational History  . Not on file.   Social History Main Topics  . Smoking status: Former Smoker -- 2.00 packs/day for 50 years    Types: Cigarettes    Quit date: 01/18/2011  . Smokeless tobacco: Never Used     Comment: STATES QUIT SMOKING 01-18-2011  . Alcohol Use: No     Comment: RECOVERING ALCOHOLIC--   QUIT IN 2000  . Drug Use: No  . Sexually Active: No    Other Topics Concern  . Not on file   Social History Narrative  . No narrative on file    Review of Systems: Pertinent positive and negative review of systems were noted in the above HPI section.  All other review of systems was otherwise negative. Marland Kitchen  Physical Exam: Vital signs in last 24 hours: Temp:  [97.5 F (36.4 C)-98.3 F (36.8 C)] 98.3 F (36.8 C) (02/15 0600) Pulse Rate:  [58-75] 64 (02/15 0600) Resp:  [18-20] 18 (02/15 0600) BP: (101-192)/(67-107) 127/75 mmHg (02/15 0600) SpO2:  [91 %-98 %] 91 % (02/15 0600) FiO2 (%):  [91 %] 91 % (02/14 1831) Weight:  [160 lb (72.576 kg)-162 lb (73.483 kg)] 162 lb (73.483 kg) (02/14 2100) Last BM Date: 03/01/12 General:   Alert,  Well-developed, well-nourished, pleasant and cooperative in NAD, she eaten facial twitching involving her left eyelid and left cheek Head:  Normocephalic and atraumatic. Eyes:  Sclera clear, no icterus.   Conjunctiva pink. Ears:  Normal auditory acuity. Nose:  No deformity, discharge,  or lesions. Mouth:  No deformity or lesions.   Neck:  Supple; no masses or thyromegaly. Lungs:  Clear throughout to auscultation.   No wheezes, crackles, or rhonchi. Heart:  Regular rate and rhythm; no murmurs, clicks, rubs,  or gallops. Abdomen:  Soft, minimally tender in the epigastrium there is no guarding or rebound no palpable mass or hepatosplenomegaly bowel sounds are active Rectal:  Deferred  Msk:  Symmetrical without gross deformities. . Pulses:  Normal pulses noted. Extremities:  Without clubbing or edema. Neurologic:  Alert and  oriented x4;  grossly normal neurologically. Skin:  Intact without significant lesions or rashes.. Psych:  Alert and cooperative. Normal mood and affect flat.  Intake/Output from previous day: 02/14 0701 - 02/15 0700 In: 600 [I.V.:600] Out: -  Intake/Output this shift:  Lab Results:  Recent Labs  03/02/12 1705  WBC 4.2  HGB 13.2  HCT 38.3  PLT 197   BMET  Recent  Labs  03/02/12 1705  NA 135  K 3.5  CL 95*  CO2 27  GLUCOSE 97  BUN 13  CREATININE 0.73  CALCIUM 10.1   LFT  Recent Labs  03/03/12 0554  PROT 6.2  ALBUMIN 3.1*  AST 21  ALT 14  ALKPHOS 76  BILITOT 0.3  BILIDIR <0.1  IBILI NOT CALCULATED   PT/INR No results found for this basename: LABPROT, INR,  in the last 72 hours Hepatitis Panel No results found for this basename: HEPBSAG, HCVAB, HEPAIGM, HEPBIGM,  in the last 72 hours    Studies/Results: Ct Abdomen Pelvis W Contrast  03/02/2012  *RADIOLOGY REPORT*  Clinical Data: Abdominal pain  CT ABDOMEN AND PELVIS WITH CONTRAST  Technique:  Multidetector CT imaging of the abdomen and pelvis was performed following the standard protocol during bolus administration of intravenous contrast.  Contrast: 80mL OMNIPAQUE IOHEXOL 300 MG/ML  SOLN  Comparison: 01/12/2012  Findings: The lung bases appear clear.  No pericardial or pleural effusion.  Interstitial change of COPD is identified.  Prominent calcifications involving the coronary arteries noted.  No focal liver abnormality noted.  The gallbladder appears normal. There is mild intrahepatic biliary prominence.  The common bile duct measures up to 7 mm. The pancreatic duct measures up to 4 mm, image 28.  This is new since the previous exam.  The spleen is normal.  There is a small cyst within the upper pole of the right kidney. Intermediate attenuating cyst arising from the upper pole of the left kidney may have enhancement.  This measures approximately 3.30 cm, image 18.  Hounsfield units equal 35.  On the noncontrast CT from 01/12/2012 the Hounsfield units equal 12.  The lower pelvis is obscured by beam hardening artifact from the patient's bilateral hip arthroplasty device.  There is advanced calcified atherosclerotic change affecting the abdominal aorta and its branches.  No aneurysm noted.  There is no upper abdominal adenopathy.  No pelvic or inguinal adenopathy.  No free fluid or fluid  collections identified within the abdomen or pelvis.  The stomach appears normal.  The small bowel loops have a normal course and caliber without evidence for obstruction.  The appendix is visualized and appears normal.  Proximal colon is normal. Multiple distal colonic diverticula identified.  Review of the visualized osseous structures is significant for osteopenia, scoliosis and mild multilevel spondylosis.  There are compression deformities involving the T11 and L4 vertebra. This is similar to 01/28/2009.  IMPRESSION:  1.  There is mild increased caliber of the intrahepatic ducts and common bile duct.  The pancreatic duct is also slightly increased in caliber from previous exam.  Difficult to exclude choledocholithiasis.  If there is clinical concern for common bile duct obstruction consider correlation with liver function tests. If further imaging is clinically indicated an MRCP would be recommended. 2.  Complicated cyst arising from the upper pole of the left kidney.  When compared with the exam from 01/12/2012 this cyst may exhibit enhancement.  Advise further evaluation with contrast enhanced MRI.   Original Report Authenticated By: Signa Kell, M.D.    Mr 3d Recon At Scanner  03/03/2012  *RADIOLOGY REPORT*  Clinical Data:  Abdominal pain.  Evaluate for common bile duct dilatation.  Evaluate for possible choledocholithiasis.  MRI ABDOMEN WITHOUT AND WITH CONTRAST (INCLUDING MRCP)  Technique:  Multiplanar multisequence  MR imaging of the abdomen was performed both before and after the administration of intravenous contrast. Heavily T2-weighted images of the biliary and pancreatic ducts were obtained, and three-dimensional MRCP images were rendered by post processing.  Contrast: 15mL MULTIHANCE GADOBENATE DIMEGLUMINE 529 MG/ML IV SOLN  Comparison:  CT of the abdomen and pelvis 03/02/2012.  Findings:  In the posterior aspect of the upper pole of the left kidney there is a 4.1 x 3.8 x 4.4 cm lesion that is  heterogeneous in signal intensity on pre gadolinium imaging, generally T2 hyperintense, but with some central T2 hypointense material that is isointense on pre gadolinium T1-weighted images, but clearly enhances on post-gadolinium T1-weighted images, concerning for a cystic renal neoplasm.  There are also subcentimeter simple cysts in the lower pole of the left kidney, and in the medial aspect of the upper pole of the right kidney. A circumaortic left renal vein (normal anatomical variant) incidentally noted.  The gallbladder is only moderately distended.  No definite gallstones are identified.  Common bile duct is dilated up to 13 mm in the porta hepatis.  This abruptly terminates before the level of the ampulla.  No definite filling defects are noted within the common bile duct to suggest presence of choledocholithiasis at this time.  There is only minimal intrahepatic biliary ductal dilatation.  Pancreatic duct is normal in caliber, and there are no focal pancreatic lesions.  The appearance of the spleen and bilateral adrenal glands is unremarkable. A Tarlov cyst in the right side of the sacrum incidentally noted.  IMPRESSION: 1.  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  No associated perirenal lymphadenopathy at this time.  This appears to be completely confined within Gerota's fascia. Notably, there does appear to be a circumaortic left renal vein (normal anatomical variant). 2.  Marked dilatation of the common bile duct (13 mm) with mild intrahepatic biliary ductal dilatation.  No choledocholithiasis. This common bile duct dilatation appears to abruptly terminate immediately above the level of the ampulla. No pancreatic head mass is identified, and there is no dilatation of the pancreatic duct. Clinical correlation for signs and symptoms of ductal stricture is recommended.  3.  Additional  incidental findings, as above.   Original Report Authenticated By: Trudie Reed, M.D.    Mr Abd W/wo Cm/mrcp  03/03/2012  *RADIOLOGY REPORT*  Clinical Data:  Abdominal pain.  Evaluate for common bile duct dilatation.  Evaluate for possible choledocholithiasis.  MRI ABDOMEN WITHOUT AND WITH CONTRAST (INCLUDING MRCP)  Technique:  Multiplanar multisequence MR imaging of the abdomen was performed both before and after the administration of intravenous contrast. Heavily T2-weighted images of the biliary and pancreatic ducts were obtained, and three-dimensional MRCP images were rendered by post processing.  Contrast: 15mL MULTIHANCE GADOBENATE DIMEGLUMINE 529 MG/ML IV SOLN  Comparison:  CT of the abdomen and pelvis 03/02/2012.  Findings:  In the posterior aspect of the upper pole of the left kidney there is a 4.1 x 3.8 x 4.4 cm lesion that is heterogeneous in signal intensity on pre gadolinium imaging, generally T2 hyperintense, but with some central T2 hypointense material that is isointense on pre gadolinium T1-weighted images, but clearly enhances on post-gadolinium T1-weighted images, concerning for a cystic renal neoplasm.  There are also subcentimeter simple cysts in the lower pole of the left kidney, and in the medial aspect of the upper pole  of the right kidney. A circumaortic left renal vein (normal anatomical variant) incidentally noted.  The gallbladder is only moderately distended.  No definite gallstones are identified.  Common bile duct is dilated up to 13 mm in the porta hepatis.  This abruptly terminates before the level of the ampulla.  No definite filling defects are noted within the common bile duct to suggest presence of choledocholithiasis at this time.  There is only minimal intrahepatic biliary ductal dilatation.  Pancreatic duct is normal in caliber, and there are no focal pancreatic lesions.  The appearance of the spleen and bilateral adrenal glands is unremarkable. A Tarlov cyst in the  right side of the sacrum incidentally noted.  IMPRESSION: 1.  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  No associated perirenal lymphadenopathy at this time.  This appears to be completely confined within Gerota's fascia. Notably, there does appear to be a circumaortic left renal vein (normal anatomical variant). 2.  Marked dilatation of the common bile duct (13 mm) with mild intrahepatic biliary ductal dilatation.  No choledocholithiasis. This common bile duct dilatation appears to abruptly terminate immediately above the level of the ampulla. No pancreatic head mass is identified, and there is no dilatation of the pancreatic duct. Clinical correlation for signs and symptoms of ductal stricture is recommended.  3.  Additional incidental findings, as above.   Original Report Authenticated By: Trudie Reed, M.D.     IMPRESSION:  #91  67 year old female with 2-3 week history of persistent epigastric pain, unaffected by by mouth intake and exacerbated by position. Etiology of the pain is not clear, however CT of the abdomen and pelvis and MRCP show a dilated common bile duct with tapering above the ampulla raising the possibility of choledocholithiasis, stricture or occult neoplasm . Her liver function studies are normal, and her gallbladder appears normal as well. Other possibility for etiology of pain would be an aspirin NSAID-induced ulcer. #2 coronary artery disease status post MI and remote stent #3 peripheral vascular disease #4 history of squamous cell carcinoma of the vagina-occurring after vaginal hysterectomy for squamous cell dysplasia 2011. Patient just completed radiation therapy #5 left renal cell lesion worrisome for left renal cell carcinoma #6 chronic depression/anxiety/bipolar disorder #7 recovering alcoholic-abstinent for many years   Plan; Follow serial liver function  studies IV PPI twice daily Advance diet as tolerated Will most likely benefit from upper endoscopy and EUS initially and then ERCP if clinically indicated. This will be scheduled with Dr. Christella Hartigan on Monday to 17 or Tuesday, 03/06/2012 . She will need propofol given use of psychotropics and analgesics. Plans have been discussed with the patient in detail. (She is on subcutaneous heparin and this will need to be held preprocedure)      Amy Esterwood  03/03/2012, 2:19 PM   GI ATTENDING  History, laboratories, and multiple x-ray studies reviewed. Patient personally seen and examined. Friend in room. Agree with history and physical as outlined above.  Impression: Several week history of intermittent upper abdominal pain as described. Exam unremarkable Trivial elevation of lipase transiently. Normal liver tests. Mostly extrahepatic biliary dilation with some intrahepatic dilation on both studies. No evidence for stones or mass. She does take NSAIDs. Found to have left renal cell carcinoma.  Plan: From a GI standpoint we will place her on PPI to cover possible ulcer disease. The next best diagnostic step,  I believe, would be an endoscopic ultrasound. This will allow [place] to assess the upper GI mucosa for ulcer disease. Also, the ampullary region and bile duct could be interrogated further. If no abnormalities or stones, may be nothing further done. However, if relevant pathology found, may proceed to ERCP. I have discussed this with the patient in detail. Finally, agree with urology evaluation regarding probable left renal cell carcinoma. We will follow  Wilhemina Bonito. Eda Keys., M.D. [facility] Division of Gastroenterology

## 2012-03-03 NOTE — Progress Notes (Signed)
[facility] PROGRESS NOTE  [name] ZOX:[id number] DOB: [birth year] DOA: 03/02/2012  PCP: Maryelizabeth Rowan, MD Changing to Dr. Laurann Montana  Brief HPI: 67 yo female with one to two weeks of ruq/epigastric abd pain without any n/v which is not associated with eating. No fevers. No diarrhea. Pain resolved with tx in ED. Ct abd shows show dilated ducts and needs mrcp. Still has gallbladder. No cp or sob.   Past medical history:  Past Medical History  Diagnosis Date  . Hypertension   . Depression   . Alcoholism     recovering since 2000  . PONV (postoperative nausea and vomiting)   . Other and unspecified general anesthetics causing adverse effect in therapeutic use post op delirium--  last anes record w/ chart  from   09-22-2009 (spinal w/ light sedation)  . Coronary artery disease CARDIOLOGIST- DR [place]--- LAST VISIT NOTE 09-07-2009  W/ CHART  . Inferior MI 2000--  POST PTCA W/ STENT X1  . Bipolar disorder   . Peripheral vascular disease POST RIGHT CAROTID SURG.  1995  . Status post carotid endarterectomy RIGHT --  1995  . Status post primary angioplasty with coronary stent 2000--  POST INFERIOR MI  . Blood transfusion   . Normal cardiac stress test 2008-- PER DR [place] NOTE 09-07-2009  . Arthritis BACK  . Chronic back pain   . Osteoporosis   . Rosacea LEFT FACIAL RASH  . Idiopathic acute facial nerve palsy LEFT SIDE--  BOTOX THERAPY  . Blepharospasm LEFT EYE  . History of alcohol abuse RECOVERING SINCE 2000  . Anxiety   . Unstable balance WALKS W/ CANE  . HTN (hypertension)   . Vaginal cancer   . Cervical cancer 12/28/11     vagina upper right bx==squamous cell ca    Consultants: None  Procedures: None  Antibiotics: None  Subjective: Patient complains of 6/10 pain in the upper abdomen. Denies nausea. No radiation of pain. No fever. No longer constipated.  Objective: Vital Signs  Filed Vitals:   03/02/12 1938 03/02/12 2100 03/02/12 2345 03/03/12 0600   BP: 158/87 180/103 101/67 127/75  Pulse: 68 75 71 64  Temp: 97.5 F (36.4 C) 97.6 F (36.4 C)  98.3 F (36.8 C)  TempSrc: Oral Oral  Oral  Resp: 20 20 20 18   Height:  5' 6.5" (1.689 m)    Weight:  73.483 kg (162 lb)    SpO2: 96% 97% 94% 91%    Intake/Output Summary (Last 24 hours) at 03/03/12 1114 Last data filed at 03/03/12 0500  Gross per 24 hour  Intake    600 ml  Output      0 ml  Net    600 ml   Filed Weights   03/02/12 1443 03/02/12 2100  Weight: 72.576 kg (160 lb) 73.483 kg (162 lb)   General appearance: alert, cooperative, appears stated age and no distress Back: symmetric, no curvature. ROM normal. No CVA tenderness. Resp: clear to auscultation bilaterally Cardio: regular rate and rhythm, S1, S2 normal, no S3 or S4, systolic murmur: early systolic 3/6, crescendo at 2nd left intercostal space, no click and no rub GI: Soft, tender in RUQ and epig without rebound. BS present. No masses or organomegaly. Extremities: edema chronic edema 1+ Pulses: 2+ and symmetric Neurologic: Alert and oriented x3. No focal deficits.  Lab Results:  Basic Metabolic Panel:  Recent Labs Lab 03/02/12 1705  NA 135  K 3.5  CL 95*  CO2 27  GLUCOSE 97  BUN 13  CREATININE 0.73  CALCIUM 10.1   Liver Function Tests:  Recent Labs Lab 03/02/12 1705 03/03/12 0554  AST 21 21  ALT 16 14  ALKPHOS 107 76  BILITOT 0.3 0.3  PROT 7.8 6.2  ALBUMIN 3.7 3.1*    Recent Labs Lab 03/02/12 1705 03/03/12 0554  LIPASE 66* 20   CBC:  Recent Labs Lab 03/02/12 1705  WBC 4.2  NEUTROABS 2.3  HGB 13.2  HCT 38.3  MCV 90.8  PLT 197     Studies/Results: Ct Abdomen Pelvis W Contrast  03/02/2012  *RADIOLOGY REPORT*  Clinical Data: Abdominal pain  CT ABDOMEN AND PELVIS WITH CONTRAST  Technique:  Multidetector CT imaging of the abdomen and pelvis was performed following the standard protocol during bolus administration of intravenous contrast.  Contrast: 80mL OMNIPAQUE IOHEXOL 300  MG/ML  SOLN  Comparison: 01/12/2012  Findings: The lung bases appear clear.  No pericardial or pleural effusion.  Interstitial change of COPD is identified.  Prominent calcifications involving the coronary arteries noted.  No focal liver abnormality noted.  The gallbladder appears normal. There is mild intrahepatic biliary prominence.  The common bile duct measures up to 7 mm. The pancreatic duct measures up to 4 mm, image 28.  This is new since the previous exam.  The spleen is normal.  There is a small cyst within the upper pole of the right kidney. Intermediate attenuating cyst arising from the upper pole of the left kidney may have enhancement.  This measures approximately 3.30 cm, image 18.  Hounsfield units equal 35.  On the noncontrast CT from 01/12/2012 the Hounsfield units equal 12.  The lower pelvis is obscured by beam hardening artifact from the patient's bilateral hip arthroplasty device.  There is advanced calcified atherosclerotic change affecting the abdominal aorta and its branches.  No aneurysm noted.  There is no upper abdominal adenopathy.  No pelvic or inguinal adenopathy.  No free fluid or fluid collections identified within the abdomen or pelvis.  The stomach appears normal.  The small bowel loops have a normal course and caliber without evidence for obstruction.  The appendix is visualized and appears normal.  Proximal colon is normal. Multiple distal colonic diverticula identified.  Review of the visualized osseous structures is significant for osteopenia, scoliosis and mild multilevel spondylosis.  There are compression deformities involving the T11 and L4 vertebra. This is similar to 01/28/2009.  IMPRESSION:  1.  There is mild increased caliber of the intrahepatic ducts and common bile duct.  The pancreatic duct is also slightly increased in caliber from previous exam.  Difficult to exclude choledocholithiasis.  If there is clinical concern for common bile duct obstruction consider  correlation with liver function tests. If further imaging is clinically indicated an MRCP would be recommended. 2.  Complicated cyst arising from the upper pole of the left kidney.  When compared with the exam from 01/12/2012 this cyst may exhibit enhancement.  Advise further evaluation with contrast enhanced MRI.   Original Report Authenticated By: Signa Kell, M.D.     Medications:  Scheduled: . ARIPiprazole  15 mg Oral Daily  . aspirin EC  81 mg Oral Daily  . cloNIDine  0.2 mg Oral BID  . lamoTRIgine  200 mg Oral BID  . 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 Lipase was elevated yesterday. Normal today. MRCP pending.  Left Renal Cyst Discussed with patient. She will need further work up for this as OP. She will discuss with her PCP. Will likely need referral to Urology.  LE Edema Per patient has been present for last many years. Have asked her to discuss with PCP.  Vaginal Cancer S/P radiation treatment recently.  History of HTN Continue medications  Code Status: Full Code DVT Prophylaxis: Initiate Heparin Family Communication: Discussed with patient  Disposition Plan: Likely return home when improved.    LOS: 1 day   [facility]  [facility] Pager [phone] 03/03/2012, 11:14 AM  If 8PM-8AM, please contact night-coverage at [email or web address], password [facility]

## 2012-03-03 NOTE — Progress Notes (Signed)
UR completed 

## 2012-03-04 DIAGNOSIS — J441 Chronic obstructive pulmonary disease with (acute) exacerbation: Secondary | ICD-10-CM | POA: Diagnosis present

## 2012-03-04 DIAGNOSIS — I10 Essential (primary) hypertension: Secondary | ICD-10-CM | POA: Diagnosis not present

## 2012-03-04 DIAGNOSIS — R1013 Epigastric pain: Secondary | ICD-10-CM | POA: Diagnosis not present

## 2012-03-04 DIAGNOSIS — K859 Acute pancreatitis without necrosis or infection, unspecified: Secondary | ICD-10-CM | POA: Diagnosis not present

## 2012-03-04 DIAGNOSIS — R0902 Hypoxemia: Secondary | ICD-10-CM

## 2012-03-04 DIAGNOSIS — M7989 Other specified soft tissue disorders: Secondary | ICD-10-CM | POA: Diagnosis not present

## 2012-03-04 DIAGNOSIS — R932 Abnormal findings on diagnostic imaging of liver and biliary tract: Secondary | ICD-10-CM | POA: Diagnosis not present

## 2012-03-04 DIAGNOSIS — J4489 Other specified chronic obstructive pulmonary disease: Secondary | ICD-10-CM

## 2012-03-04 DIAGNOSIS — C52 Malignant neoplasm of vagina: Secondary | ICD-10-CM | POA: Diagnosis not present

## 2012-03-04 DIAGNOSIS — R935 Abnormal findings on diagnostic imaging of other abdominal regions, including retroperitoneum: Secondary | ICD-10-CM | POA: Diagnosis not present

## 2012-03-04 DIAGNOSIS — J449 Chronic obstructive pulmonary disease, unspecified: Secondary | ICD-10-CM | POA: Diagnosis present

## 2012-03-04 DIAGNOSIS — Z87891 Personal history of nicotine dependence: Secondary | ICD-10-CM

## 2012-03-04 LAB — COMPREHENSIVE METABOLIC PANEL
AST: 17 U/L (ref 0–37)
Albumin: 2.9 g/dL — ABNORMAL LOW (ref 3.5–5.2)
BUN: 11 mg/dL (ref 6–23)
Creatinine, Ser: 0.65 mg/dL (ref 0.50–1.10)
Potassium: 3.6 mEq/L (ref 3.5–5.1)
Total Protein: 6.1 g/dL (ref 6.0–8.3)

## 2012-03-04 LAB — CBC
HCT: 34.4 % — ABNORMAL LOW (ref 36.0–46.0)
MCHC: 34 g/dL (ref 30.0–36.0)
MCV: 91.7 fL (ref 78.0–100.0)
Platelets: 169 10*3/uL (ref 150–400)
RDW: 13 % (ref 11.5–15.5)
WBC: 5.3 10*3/uL (ref 4.0–10.5)

## 2012-03-04 LAB — LIPASE, BLOOD: Lipase: 20 U/L (ref 11–59)

## 2012-03-04 MED ORDER — SERTRALINE HCL 50 MG PO TABS
50.0000 mg | ORAL_TABLET | Freq: Every day | ORAL | Status: DC
  Administered 2012-03-04 – 2012-03-06 (×3): 50 mg via ORAL
  Filled 2012-03-04 (×4): qty 1

## 2012-03-04 MED ORDER — ARIPIPRAZOLE 15 MG PO TABS
15.0000 mg | ORAL_TABLET | Freq: Every day | ORAL | Status: DC
  Administered 2012-03-04 – 2012-03-06 (×3): 15 mg via ORAL
  Filled 2012-03-04 (×4): qty 1

## 2012-03-04 MED ORDER — LAMOTRIGINE 200 MG PO TABS
200.0000 mg | ORAL_TABLET | Freq: Two times a day (BID) | ORAL | Status: DC
  Administered 2012-03-04 – 2012-03-06 (×5): 200 mg via ORAL
  Filled 2012-03-04 (×7): qty 1

## 2012-03-04 MED ORDER — CLONIDINE HCL 0.2 MG PO TABS
0.2000 mg | ORAL_TABLET | Freq: Two times a day (BID) | ORAL | Status: DC
  Administered 2012-03-04 – 2012-03-06 (×5): 0.2 mg via ORAL
  Filled 2012-03-04 (×7): qty 1

## 2012-03-04 MED ORDER — MOMETASONE FURO-FORMOTEROL FUM 100-5 MCG/ACT IN AERO
2.0000 | INHALATION_SPRAY | Freq: Two times a day (BID) | RESPIRATORY_TRACT | Status: DC
  Administered 2012-03-04 – 2012-03-06 (×5): 2 via RESPIRATORY_TRACT
  Filled 2012-03-04: qty 8.8

## 2012-03-04 MED ORDER — TIOTROPIUM BROMIDE MONOHYDRATE 18 MCG IN CAPS
18.0000 ug | ORAL_CAPSULE | Freq: Every day | RESPIRATORY_TRACT | Status: DC
  Administered 2012-03-04 – 2012-03-06 (×3): 18 ug via RESPIRATORY_TRACT
  Filled 2012-03-04: qty 5

## 2012-03-04 NOTE — Progress Notes (Signed)
Patient ID: [name], female   DOB: [birth year], 67 y.o.   MRN: [id number] [facility] Progress Note  Subjective: Comfortable-has back pain which is chronic-says abdomen not hurting if lying down resting. Not much appetite. Lft's remain normal.  Objective:  Vital signs in last 24 hours: Temp:  [98.2 F (36.8 C)-98.5 F (36.9 C)] 98.5 F (36.9 C) (02/16 0600) Pulse Rate:  [66-74] 74 (02/16 0600) Resp:  [16-18] 16 (02/16 0600) BP: (136-165)/(76-89) 156/89 mmHg (02/16 0600) SpO2:  [87 %-94 %] 94 % (02/16 0600) Last BM Date: 03/01/12 General:   Alert,  Well-developed, older WF   in NAD Heart:  Regular rate and rhythm; no murmurs Pulm;clear Abdomen:  Soft, tender in epigastrium and nondistended. Normal bowel sounds, without guarding, and without rebound.  No mass or HSM Extremities:  Without edema. Neurologic:  Alert and  oriented x4;  grossly normal neurologically. Psych:  Alert and cooperative. Normal mood and affect.  Intake/Output from previous day:   Intake/Output this shift:    Lab Results:  Recent Labs  03/02/12 1705 03/04/12 0556  WBC 4.2 5.3  HGB 13.2 11.7*  HCT 38.3 34.4*  PLT 197 169   BMET  Recent Labs  03/02/12 1705 03/04/12 0556  NA 135 133*  K 3.5 3.6  CL 95* 98  CO2 27 24  GLUCOSE 97 81  BUN 13 11  CREATININE 0.73 0.65  CALCIUM 10.1 9.3   LFT  Recent Labs  03/03/12 0554 03/04/12 0556  PROT 6.2 6.1  ALBUMIN 3.1* 2.9*  AST 21 17  ALT 14 11  ALKPHOS 76 68  BILITOT 0.3 0.3  BILIDIR <0.1  --   IBILI NOT CALCULATED  --      Assessment / Plan: #1 67 yo female with 2-3 week hx of upper Abdominal pain-with imaging showing dilated CBD with abrupt tapering just above ampulla-R/o CBD stricture,ampullary lesion,occult neoplasm.  Plan is for EGD-(r/o PUD) , and EUS tomorrow with Dr. 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 Patient seen and examined personally. Relative in room. Complain of chronic back pain. Some recurrent upper abdominal pain. Liver tests remain normal. Biliary ductal dilation of uncertain clinical significance. Plans for EGD and possible EUS plus or minus ERCP as outlined. Will discuss with Dr. Christella Hartigan and GI team in a.m. meeting. Patient understands.  Wilhemina Bonito. Eda Keys., M.D. [facility] Division of Gastroenterology

## 2012-03-04 NOTE — Progress Notes (Signed)
[facility] PROGRESS NOTE  [name] ZOX:[id number] DOB: [birth year] DOA: 03/02/2012  PCP: Cala Bradford, MD   Brief HPI: 67 yo female with one to two weeks of ruq/epigastric abd pain without any n/v which is not associated with eating. No fevers. No diarrhea. Pain resolved with tx in ED. Ct abd shows show dilated ducts and needs mrcp. Still has gallbladder. No cp or sob.   Past medical history:  Past Medical History  Diagnosis Date  . Hypertension   . Depression   . Alcoholism     recovering since 2000  . PONV (postoperative nausea and vomiting)   . Other and unspecified general anesthetics causing adverse effect in therapeutic use post op delirium--  last anes record w/ chart  from   09-22-2009 (spinal w/ light sedation)  . Coronary artery disease CARDIOLOGIST- DR [place]--- LAST VISIT NOTE 09-07-2009  W/ CHART  . Inferior MI 2000--  POST PTCA W/ STENT X1  . Bipolar disorder   . Peripheral vascular disease POST RIGHT CAROTID SURG.  1995  . Status post carotid endarterectomy RIGHT --  1995  . Status post primary angioplasty with coronary stent 2000--  POST INFERIOR MI  . Blood transfusion   . Normal cardiac stress test 2008-- PER DR [place] NOTE 09-07-2009  . Arthritis BACK  . Chronic back pain   . Osteoporosis   . Rosacea LEFT FACIAL RASH  . Idiopathic acute facial nerve palsy LEFT SIDE--  BOTOX THERAPY  . Blepharospasm LEFT EYE  . History of alcohol abuse RECOVERING SINCE 2000  . Anxiety   . Unstable balance WALKS W/ CANE  . HTN (hypertension)   . Vaginal cancer   . Cervical cancer 12/28/11     vagina upper right bx==squamous cell ca    Consultants: None  Procedures: None  Antibiotics: None  Subjective: Patient complains of back pain which is chronic for her. Denies any abdominal pain. No nausea.   Objective: Vital Signs  Filed Vitals:   03/03/12 1455 03/03/12 1500 03/03/12 2117 03/04/12 0600  BP:   165/84 156/89  Pulse:   66 74  Temp:   98.2 F  (36.8 C) 98.5 F (36.9 C)  TempSrc:   Axillary Oral  Resp:   16 16  Height:      Weight:      SpO2: 90% 94% 93% 94%   No intake or output data in the 24 hours ending 03/04/12 0949 Filed Weights   03/02/12 1443 03/02/12 2100  Weight: 72.576 kg (160 lb) 73.483 kg (162 lb)   General appearance: alert, cooperative, appears stated age and no distress Resp: clear to auscultation bilaterally. Coarse breath sounds. Cardio: regular rate and rhythm, S1, S2 normal, no S3 or S4, systolic murmur: early systolic 3/6, crescendo at 2nd left intercostal space, no click and no rub GI: Soft, non tender today. BS present. No masses or organomegaly. Extremities: edema chronic edema 1+ Pulses: 2+ and symmetric Neurologic: Alert and oriented x3. No focal deficits.  Lab Results:  Basic Metabolic Panel:  Recent Labs Lab 03/02/12 1705 03/04/12 0556  NA 135 133*  K 3.5 3.6  CL 95* 98  CO2 27 24  GLUCOSE 97 81  BUN 13 11  CREATININE 0.73 0.65  CALCIUM 10.1 9.3   Liver Function Tests:  Recent Labs Lab 03/02/12 1705 03/03/12 0554 03/04/12 0556  AST 21 21 17   ALT 16 14 11   ALKPHOS 107 76 68  BILITOT 0.3 0.3 0.3  PROT 7.8 6.2 6.1  ALBUMIN 3.7 3.1* 2.9*    Recent Labs Lab 03/02/12 1705 03/03/12 0554 03/04/12 0556  LIPASE 66* 20 20   CBC:  Recent Labs Lab 03/02/12 1705 03/04/12 0556  WBC 4.2 5.3  NEUTROABS 2.3  --   HGB 13.2 11.7*  HCT 38.3 34.4*  MCV 90.8 91.7  PLT 197 169     Studies/Results: Dg Chest 2 View  03/03/2012  *RADIOLOGY REPORT*  Clinical Data: Hypoxia.  Hypertension.  Prior smoker.  CHEST - 2 VIEW  Comparison: 02/08/2009  Findings: There is hyperinflation of the lungs compatible with COPD.  Interstitial prominence throughout the lungs, most pronounced in the bases, likely chronic interstitial changes and scarring.  Heart is borderline in size.  No effusions.  No acute bony abnormality. Postsurgical changes in the proximal right humerus.  IMPRESSION:  COPD/chronic changes.  No acute findings.   Original Report Authenticated By: Charlett Nose, M.D.    Ct Abdomen Pelvis W Contrast  03/02/2012  *RADIOLOGY REPORT*  Clinical Data: Abdominal pain  CT ABDOMEN AND PELVIS WITH CONTRAST  Technique:  Multidetector CT imaging of the abdomen and pelvis was performed following the standard protocol during bolus administration of intravenous contrast.  Contrast: 80mL OMNIPAQUE IOHEXOL 300 MG/ML  SOLN  Comparison: 01/12/2012  Findings: The lung bases appear clear.  No pericardial or pleural effusion.  Interstitial change of COPD is identified.  Prominent calcifications involving the coronary arteries noted.  No focal liver abnormality noted.  The gallbladder appears normal. There is mild intrahepatic biliary prominence.  The common bile duct measures up to 7 mm. The pancreatic duct measures up to 4 mm, image 28.  This is new since the previous exam.  The spleen is normal.  There is a small cyst within the upper pole of the right kidney. Intermediate attenuating cyst arising from the upper pole of the left kidney may have enhancement.  This measures approximately 3.30 cm, image 18.  Hounsfield units equal 35.  On the noncontrast CT from 01/12/2012 the Hounsfield units equal 12.  The lower pelvis is obscured by beam hardening artifact from the patient's bilateral hip arthroplasty device.  There is advanced calcified atherosclerotic change affecting the abdominal aorta and its branches.  No aneurysm noted.  There is no upper abdominal adenopathy.  No pelvic or inguinal adenopathy.  No free fluid or fluid collections identified within the abdomen or pelvis.  The stomach appears normal.  The small bowel loops have a normal course and caliber without evidence for obstruction.  The appendix is visualized and appears normal.  Proximal colon is normal. Multiple distal colonic diverticula identified.  Review of the visualized osseous structures is significant for osteopenia, scoliosis  and mild multilevel spondylosis.  There are compression deformities involving the T11 and L4 vertebra. This is similar to 01/28/2009.  IMPRESSION:  1.  There is mild increased caliber of the intrahepatic ducts and common bile duct.  The pancreatic duct is also slightly increased in caliber from previous exam.  Difficult to exclude choledocholithiasis.  If there is clinical concern for common bile duct obstruction consider correlation with liver function tests. If further imaging is clinically indicated an MRCP would be recommended. 2.  Complicated cyst arising from the upper pole of the left kidney.  When compared with the exam from 01/12/2012 this cyst may exhibit enhancement.  Advise further evaluation with contrast enhanced MRI.   Original Report Authenticated By: Signa Kell, M.D.    Mr 3d Recon At Scanner  03/03/2012  *RADIOLOGY REPORT*  Clinical Data:  Abdominal pain.  Evaluate for common bile duct dilatation.  Evaluate for possible choledocholithiasis.  MRI ABDOMEN WITHOUT AND WITH CONTRAST (INCLUDING MRCP)  Technique:  Multiplanar multisequence MR imaging of the abdomen was performed both before and after the administration of intravenous contrast. Heavily T2-weighted images of the biliary and pancreatic ducts were obtained, and three-dimensional MRCP images were rendered by post processing.  Contrast: 15mL MULTIHANCE GADOBENATE DIMEGLUMINE 529 MG/ML IV SOLN  Comparison:  CT of the abdomen and pelvis 03/02/2012.  Findings:  In the posterior aspect of the upper pole of the left kidney there is a 4.1 x 3.8 x 4.4 cm lesion that is heterogeneous in signal intensity on pre gadolinium imaging, generally T2 hyperintense, but with some central T2 hypointense material that is isointense on pre gadolinium T1-weighted images, but clearly enhances on post-gadolinium T1-weighted images, concerning for a cystic renal neoplasm.  There are also subcentimeter simple cysts in the lower pole of the left kidney, and in the  medial aspect of the upper pole of the right kidney. A circumaortic left renal vein (normal anatomical variant) incidentally noted.  The gallbladder is only moderately distended.  No definite gallstones are identified.  Common bile duct is dilated up to 13 mm in the porta hepatis.  This abruptly terminates before the level of the ampulla.  No definite filling defects are noted within the common bile duct to suggest presence of choledocholithiasis at this time.  There is only minimal intrahepatic biliary ductal dilatation.  Pancreatic duct is normal in caliber, and there are no focal pancreatic lesions.  The appearance of the spleen and bilateral adrenal glands is unremarkable. A Tarlov cyst in the right side of the sacrum incidentally noted.  IMPRESSION: 1.  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  No associated perirenal lymphadenopathy at this time.  This appears to be completely confined within Gerota's fascia. Notably, there does appear to be a circumaortic left renal vein (normal anatomical variant). 2.  Marked dilatation of the common bile duct (13 mm) with mild intrahepatic biliary ductal dilatation.  No choledocholithiasis. This common bile duct dilatation appears to abruptly terminate immediately above the level of the ampulla. No pancreatic head mass is identified, and there is no dilatation of the pancreatic duct. Clinical correlation for signs and symptoms of ductal stricture is recommended.  3.  Additional incidental findings, as above.   Original Report Authenticated By: Trudie Reed, M.D.    Mr Abd W/wo Cm/mrcp  03/03/2012  *RADIOLOGY REPORT*  Clinical Data:  Abdominal pain.  Evaluate for common bile duct dilatation.  Evaluate for possible choledocholithiasis.  MRI ABDOMEN WITHOUT AND WITH CONTRAST (INCLUDING MRCP)  Technique:  Multiplanar multisequence MR imaging of the abdomen  was performed both before and after the administration of intravenous contrast. Heavily T2-weighted images of the biliary and pancreatic ducts were obtained, and three-dimensional MRCP images were rendered by post processing.  Contrast: 15mL MULTIHANCE GADOBENATE DIMEGLUMINE 529 MG/ML IV SOLN  Comparison:  CT of the abdomen and pelvis 03/02/2012.  Findings:  In the posterior aspect of the upper pole of the left kidney there is a 4.1 x 3.8 x 4.4 cm lesion that is heterogeneous in signal intensity on pre gadolinium imaging, generally T2 hyperintense, but with some central T2 hypointense material that is isointense on pre gadolinium T1-weighted images, but clearly enhances on post-gadolinium T1-weighted  images, concerning for a cystic renal neoplasm.  There are also subcentimeter simple cysts in the lower pole of the left kidney, and in the medial aspect of the upper pole of the right kidney. A circumaortic left renal vein (normal anatomical variant) incidentally noted.  The gallbladder is only moderately distended.  No definite gallstones are identified.  Common bile duct is dilated up to 13 mm in the porta hepatis.  This abruptly terminates before the level of the ampulla.  No definite filling defects are noted within the common bile duct to suggest presence of choledocholithiasis at this time.  There is only minimal intrahepatic biliary ductal dilatation.  Pancreatic duct is normal in caliber, and there are no focal pancreatic lesions.  The appearance of the spleen and bilateral adrenal glands is unremarkable. A Tarlov cyst in the right side of the sacrum incidentally noted.  IMPRESSION: 1.  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  No associated perirenal lymphadenopathy at this time.  This appears to be completely confined within Gerota's fascia. Notably, there does appear to be a circumaortic left  renal vein (normal anatomical variant). 2.  Marked dilatation of the common bile duct (13 mm) with mild intrahepatic biliary ductal dilatation.  No choledocholithiasis. This common bile duct dilatation appears to abruptly terminate immediately above the level of the ampulla. No pancreatic head mass is identified, and there is no dilatation of the pancreatic duct. Clinical correlation for signs and symptoms of ductal stricture is recommended.  3.  Additional incidental findings, as above.   Original Report Authenticated By: Trudie Reed, M.D.     Medications:  Scheduled: . ARIPiprazole  15 mg Oral Daily  . aspirin EC  81 mg Oral Daily  . cloNIDine  0.2 mg Oral BID  . heparin subcutaneous  5,000 Units Subcutaneous Q8H  . lamoTRIgine  200 mg Oral BID  . mometasone-formoterol  2 puff Inhalation BID  . pantoprazole (PROTONIX) IV  40 mg Intravenous Q12H  . sertraline  50 mg Oral Daily  . tiotropium  18 mcg Inhalation Daily   Continuous:  ZOX:WRUE & mag hydroxide-simeth, clonazePAM, HYDROcodone-acetaminophen, morphine injection, ondansetron (ZOFRAN) IV, ondansetron  Assessment/Plan:  Principal Problem:   Abnormal abdominal CT scan Active Problems:   Carotid artery disease   Cancer of vaginal vault - recurrent from cervix   Alcoholism   Bipolar disorder   Abdominal pain, acute, epigastric   Hypoxia   Former smoker   COPD (chronic obstructive pulmonary disease)    RUQ Abdominal Pain MRCP report reviewed. Appreciate GI input. Plan is for EGD/EUS 2/17 or 2/18. LFT and Lipase normal. PPI.  Left Renal Mass Suspicious for RCC. Discussed with patient. She will need further work up for this as OP. I have discussed with Dr. Mena Goes with Urology and he recommends follow up with him in his office.   Hypoxia No respiratory distress. Denies shortness of breath or chest pain. CXR does show chronic lung changes. She is a former smoker and likely has COPD. Will initiate Dulera and Spiriva.  Doesn't have wheezes. Recheck O2 sats prior to discharge to see if she needs home O2.  LE Edema Per patient has been present for last many years. Have asked her to discuss with PCP.  History of Vaginal Cancer S/P radiation treatment recently.  History of HTN Continue medications  History of Bipolar Disorder Continue home  medications  Code Status: Full Code DVT Prophylaxis: Heparin to be held prior to procedure. Family Communication: Discussed with patient  Disposition Plan: Likely return home when improved.    LOS: 2 days   [facility]  [facility] Pager [phone] 03/04/2012, 9:49 AM  If 8PM-8AM, please contact night-coverage at [email or web address], password [facility]

## 2012-03-04 NOTE — Progress Notes (Signed)
VASCULAR LAB PRELIMINARY  PRELIMINARY  PRELIMINARY  PRELIMINARY  Bilateral lower extremity venous Dopplers completed.    Preliminary report:  There is no DVT or SVT noted in the bilateral lower extremities.  Curry Dulski, RVT 03/04/2012, 10:27 AM

## 2012-03-05 ENCOUNTER — Encounter (HOSPITAL_COMMUNITY): Payer: Self-pay | Admitting: Anesthesiology

## 2012-03-05 ENCOUNTER — Encounter (HOSPITAL_COMMUNITY)
Admission: EM | Disposition: A | Discharge status: Home / Self Care | Payer: Self-pay | Source: Home / Self Care | Attending: Internal Medicine

## 2012-03-05 ENCOUNTER — Ambulatory Visit: Payer: Medicare Other

## 2012-03-05 ENCOUNTER — Inpatient Hospital Stay (HOSPITAL_COMMUNITY): Payer: Medicare Other | Admitting: Anesthesiology

## 2012-03-05 ENCOUNTER — Telehealth: Payer: Self-pay | Admitting: *Deleted

## 2012-03-05 DIAGNOSIS — K2970 Gastritis, unspecified, without bleeding: Secondary | ICD-10-CM | POA: Diagnosis present

## 2012-03-05 DIAGNOSIS — K2990 Gastroduodenitis, unspecified, without bleeding: Secondary | ICD-10-CM | POA: Diagnosis present

## 2012-03-05 DIAGNOSIS — R932 Abnormal findings on diagnostic imaging of liver and biliary tract: Secondary | ICD-10-CM | POA: Diagnosis not present

## 2012-03-05 DIAGNOSIS — K838 Other specified diseases of biliary tract: Secondary | ICD-10-CM | POA: Diagnosis not present

## 2012-03-05 DIAGNOSIS — R935 Abnormal findings on diagnostic imaging of other abdominal regions, including retroperitoneum: Secondary | ICD-10-CM | POA: Diagnosis not present

## 2012-03-05 DIAGNOSIS — C52 Malignant neoplasm of vagina: Secondary | ICD-10-CM | POA: Diagnosis not present

## 2012-03-05 DIAGNOSIS — R0902 Hypoxemia: Secondary | ICD-10-CM | POA: Diagnosis not present

## 2012-03-05 DIAGNOSIS — R1013 Epigastric pain: Secondary | ICD-10-CM | POA: Diagnosis not present

## 2012-03-05 DIAGNOSIS — R52 Pain, unspecified: Secondary | ICD-10-CM | POA: Diagnosis not present

## 2012-03-05 DIAGNOSIS — K859 Acute pancreatitis without necrosis or infection, unspecified: Secondary | ICD-10-CM | POA: Diagnosis not present

## 2012-03-05 DIAGNOSIS — I10 Essential (primary) hypertension: Secondary | ICD-10-CM | POA: Diagnosis not present

## 2012-03-05 DIAGNOSIS — J449 Chronic obstructive pulmonary disease, unspecified: Secondary | ICD-10-CM | POA: Diagnosis not present

## 2012-03-05 DIAGNOSIS — D131 Benign neoplasm of stomach: Secondary | ICD-10-CM | POA: Diagnosis not present

## 2012-03-05 DIAGNOSIS — Q619 Cystic kidney disease, unspecified: Secondary | ICD-10-CM | POA: Diagnosis not present

## 2012-03-05 HISTORY — PX: EUS: SHX5427

## 2012-03-05 LAB — COMPREHENSIVE METABOLIC PANEL
Alkaline Phosphatase: 65 U/L (ref 39–117)
BUN: 7 mg/dL (ref 6–23)
CO2: 29 mEq/L (ref 19–32)
Chloride: 97 mEq/L (ref 96–112)
Creatinine, Ser: 0.6 mg/dL (ref 0.50–1.10)
GFR calc Af Amer: >90 mL/min (ref >90)
GFR calc non Af Amer: >90 mL/min (ref >90)
Glucose, Bld: 116 mg/dL — ABNORMAL HIGH (ref 70–99)
Potassium: 3.5 mEq/L (ref 3.5–5.1)
Total Bilirubin: 0.3 mg/dL (ref 0.3–1.2)

## 2012-03-05 LAB — CBC
HCT: 33.4 % — ABNORMAL LOW (ref 36.0–46.0)
Hemoglobin: 11.3 g/dL — ABNORMAL LOW (ref 12.0–15.0)
MCV: 92.3 fL (ref 78.0–100.0)
WBC: 5.8 10*3/uL (ref 4.0–10.5)

## 2012-03-05 SURGERY — ULTRASOUND, UPPER GI TRACT, ENDOSCOPIC
Anesthesia: Monitor Anesthesia Care

## 2012-03-05 MED ORDER — HYDRALAZINE HCL 20 MG/ML IJ SOLN: 10.0000 mg | Freq: Four times a day (QID) | INTRAMUSCULAR | Status: DC | PRN

## 2012-03-05 MED ORDER — PROPOFOL 10 MG/ML IV EMUL
INTRAVENOUS | Status: DC | PRN
  Administered 2012-03-05: 100 ug/kg/min via INTRAVENOUS

## 2012-03-05 MED ORDER — METOCLOPRAMIDE HCL 5 MG/ML IJ SOLN: 10.0000 mg | Freq: Once | INTRAMUSCULAR | Status: DC | PRN

## 2012-03-05 MED ORDER — SODIUM CHLORIDE 0.9 % IV SOLN
3.0000 g | Freq: Once | INTRAVENOUS | Status: DC
  Filled 2012-03-05: qty 3

## 2012-03-05 MED ORDER — HYDROCORTISONE 1 % EX CREA
TOPICAL_CREAM | Freq: Two times a day (BID) | CUTANEOUS | Status: DC
  Administered 2012-03-05: 15:00:00 via TOPICAL
  Filled 2012-03-05: qty 28

## 2012-03-05 MED ORDER — LACTATED RINGERS IV SOLN
INTRAVENOUS | Status: DC | PRN
  Administered 2012-03-05: 09:00:00 via INTRAVENOUS

## 2012-03-05 MED ORDER — BUTAMBEN-TETRACAINE-BENZOCAINE 2-2-14 % EX AERO
INHALATION_SPRAY | CUTANEOUS | Status: DC | PRN
  Administered 2012-03-05: 2 via TOPICAL

## 2012-03-05 MED ORDER — PANTOPRAZOLE SODIUM 40 MG PO TBEC
40.0000 mg | DELAYED_RELEASE_TABLET | Freq: Every day | ORAL | Status: DC
  Administered 2012-03-05 (×2): 40 mg via ORAL
  Filled 2012-03-05 (×2): qty 1

## 2012-03-05 NOTE — Progress Notes (Signed)
[facility] Progress Note    Since last GI note: Feels fine this AM.  Says if she bends over the pain will come back.  Pain is in lower abdomen.    Just completed 5 treatments fo XRT to pelvis for low stage vaginal cancer.  Objective: Vital signs in last 24 hours: Temp:  [97.4 F (36.3 C)-98.3 F (36.8 C)] 98.3 F (36.8 C) (02/17 0600) Pulse Rate:  [61-77] 61 (02/17 0600) Resp:  [16-18] 18 (02/17 0600) BP: (128-152)/(70-84) 128/70 mmHg (02/17 0600) SpO2:  [98 %-99 %] 99 % (02/17 0600) Last BM Date: 03/01/12 General: alert and oriented times 3 Heart: regular rate and rythm Abdomen: soft, non-tender, non-distended, normal bowel sounds   Lab Results:  Recent Labs  03/02/12 1705 03/04/12 0556 03/05/12 0510  WBC 4.2 5.3 5.8  HGB 13.2 11.7* 11.3*  PLT 197 169 144*  MCV 90.8 91.7 92.3    Recent Labs  03/02/12 1705 03/04/12 0556 03/05/12 0510  NA 135 133* 134*  K 3.5 3.6 3.5  CL 95* 98 97  CO2 27 24 29  GLUCOSE 97 81 116*  BUN 13 11 7  CREATININE 0.73 0.65 0.60  CALCIUM 10.1 9.3 9.1    Recent Labs  03/02/12 1705 03/03/12 0554 03/04/12 0556 03/05/12 0510  PROT 7.8 6.2 6.1 5.8*  ALBUMIN 3.7 3.1* 2.9* 2.7*  AST 21 21 17 17  ALT 16 14 11 12  ALKPHOS 107 76 68 65  BILITOT 0.3 0.3 0.3 0.3  BILIDIR  --  <0.1  --   --   IBILI  --  NOT CALCULATED  --   --    Medications: Scheduled Meds: . ARIPiprazole  15 mg Oral Daily  . aspirin EC  81 mg Oral Daily  . cloNIDine  0.2 mg Oral BID  . lamoTRIgine  200 mg Oral BID  . mometasone-formoterol  2 puff Inhalation BID  . pantoprazole (PROTONIX) IV  40 mg Intravenous Q12H  . sertraline  50 mg Oral Daily  . tiotropium  18 mcg Inhalation Daily   Continuous Infusions:  PRN Meds:.alum & mag hydroxide-simeth, clonazePAM, HYDROcodone-acetaminophen, morphine injection, ondansetron (ZOFRAN) IV, ondansetron    Assessment/Plan: 67 y.o. female with lower abdominal pain, abnormal abd imaging, recent pelvic  irradiation  I reviewed CT, MRI, PET scan reports as well as personally reviewed the images.  She has an unusual appearing renal lesion (not PET avid) but "highly concerning for RCC" per radiologist review. This needs to be addressed.  Recent pelvic irradiation for vaginal cancer MAY be contributing to her lower abd pains, certainly the timing of that is pretty interesting.  Her CBD is dilated.  She is a former alcoholic, quit drinking 10 years ago but perhaps she has chronic pancreatitis, scar tissue causing CBD dilation.  Ampullary lesion is possible, stone disease (not seen by MRCP) also possible. She takes 2-4 advil per day so perhaps duodenitis, local inflammation of duodenal wall causing dilated CBD.  I think the best test to sort that out is EGD/EUS and I will set that up for this AM.    Aasha Dina, MD  03/05/2012, 8:12 AM [facility] Pager [phone]     

## 2012-03-05 NOTE — Transfer of Care (Signed)
Immediate Anesthesia Transfer of Care Note  Patient: [name]  Procedure(s) Performed: Procedure(s) with comments: FULL UPPER ENDOSCOPIC ULTRASOUND (EUS) RADIAL and EGD (N/A) - ercp scope first than eus scope  Patient Location: PACU  Anesthesia Type:MAC  Level of Consciousness: awake, alert , oriented, patient cooperative and responds to stimulation  Airway & Oxygen Therapy: Patient Spontanous Breathing and Patient connected to nasal cannula oxygen  Post-op Assessment: Report given to PACU RN and Post -op Vital signs reviewed and stable  Post vital signs: Reviewed and stable  Complications: No apparent anesthesia complications

## 2012-03-05 NOTE — H&P (View-Only) (Signed)
[facility] Progress Note    Since last GI note: Feels fine this AM.  Says if she bends over the pain will come back.  Pain is in lower abdomen.    Just completed 5 treatments fo XRT to pelvis for low stage vaginal cancer.  Objective: Vital signs in last 24 hours: Temp:  [97.4 F (36.3 C)-98.3 F (36.8 C)] 98.3 F (36.8 C) (02/17 0600) Pulse Rate:  [61-77] 61 (02/17 0600) Resp:  [16-18] 18 (02/17 0600) BP: (128-152)/(70-84) 128/70 mmHg (02/17 0600) SpO2:  [98 %-99 %] 99 % (02/17 0600) Last BM Date: 03/01/12 General: alert and oriented times 3 Heart: regular rate and rythm Abdomen: soft, non-tender, non-distended, normal bowel sounds   Lab Results:  Recent Labs  03/02/12 1705 03/04/12 0556 03/05/12 0510  WBC 4.2 5.3 5.8  HGB 13.2 11.7* 11.3*  PLT 197 169 144*  MCV 90.8 91.7 92.3    Recent Labs  03/02/12 1705 03/04/12 0556 03/05/12 0510  NA 135 133* 134*  K 3.5 3.6 3.5  CL 95* 98 97  CO2 27 24 29   GLUCOSE 97 81 116*  BUN 13 11 7   CREATININE 0.73 0.65 0.60  CALCIUM 10.1 9.3 9.1    Recent Labs  03/02/12 1705 03/03/12 0554 03/04/12 0556 03/05/12 0510  PROT 7.8 6.2 6.1 5.8*  ALBUMIN 3.7 3.1* 2.9* 2.7*  AST 21 21 17 17   ALT 16 14 11 12   ALKPHOS 107 76 68 65  BILITOT 0.3 0.3 0.3 0.3  BILIDIR  --  <0.1  --   --   IBILI  --  NOT CALCULATED  --   --    Medications: Scheduled Meds: . ARIPiprazole  15 mg Oral Daily  . aspirin EC  81 mg Oral Daily  . cloNIDine  0.2 mg Oral BID  . lamoTRIgine  200 mg Oral BID  . mometasone-formoterol  2 puff Inhalation BID  . pantoprazole (PROTONIX) IV  40 mg Intravenous Q12H  . sertraline  50 mg Oral Daily  . tiotropium  18 mcg Inhalation Daily   Continuous Infusions:  PRN Meds:.alum & mag hydroxide-simeth, clonazePAM, HYDROcodone-acetaminophen, morphine injection, ondansetron (ZOFRAN) IV, ondansetron    Assessment/Plan: 67 y.o. female with lower abdominal pain, abnormal abd imaging, recent pelvic  irradiation  I reviewed CT, MRI, PET scan reports as well as personally reviewed the images.  She has an unusual appearing renal lesion (not PET avid) but "highly concerning for RCC" per radiologist review. This needs to be addressed.  Recent pelvic irradiation for vaginal cancer MAY be contributing to her lower abd pains, certainly the timing of that is pretty interesting.  Her CBD is dilated.  She is a former alcoholic, quit drinking 10 years ago but perhaps she has chronic pancreatitis, scar tissue causing CBD dilation.  Ampullary lesion is possible, stone disease (not seen by MRCP) also possible. She takes 2-4 advil per day so perhaps duodenitis, local inflammation of duodenal wall causing dilated CBD.  I think the best test to sort that out is EGD/EUS and I will set that up for this AM.    Rob Bunting, MD  03/05/2012, 8:12 AM [facility] Pager [phone]

## 2012-03-05 NOTE — Telephone Encounter (Signed)
Received call from Noreene Larsson, front office who states pt's friend is in waiting room. Mort Sawyers states she is pt's POA. She states pt was admitted to hospital. She is asking if pt's abdominal pain is due to her radiation treatments. Informed Ms Les Pou that is a question for Dr Basilio Cairo to address. Will have to route her question to Dr Basilio Cairo. Also informed her pt currently has FU w/Dr Basilio Cairo on 03/07/12. Pt in room 1519.

## 2012-03-05 NOTE — Interval H&P Note (Signed)
History and Physical Interval Note:  03/05/2012 9:09 AM  [name]  has presented today for surgery, with the diagnosis of dilated CBD  The various methods of treatment have been discussed with the patient and family. After consideration of risks, benefits and other options for treatment, the patient has consented to  Procedure(s) with comments: FULL UPPER ENDOSCOPIC ULTRASOUND (EUS) RADIAL and EGD (N/A) - ercp scope first than eus scope as a surgical intervention .  The patient's history has been reviewed, patient examined, no change in status, stable for surgery.  I have reviewed the patient's chart and labs.  Questions were answered to the patient's satisfaction.     Rob Bunting

## 2012-03-05 NOTE — Progress Notes (Addendum)
[facility] PROGRESS NOTE  [name] ZOX:[id number] DOB: 26-Jun-1945 DOA: 03/02/2012  PCP: Cala Bradford, MD   Brief HPI: 67 yo female with one to two weeks of ruq/epigastric abd pain without any n/v which is not associated with eating. No fevers. No diarrhea. Pain resolved with treatment in ED. Ct abd shows show dilated ducts and needs mrcp. Still has gallbladder. No cp or sob.   Past medical history:  Past Medical History  Diagnosis Date  . Hypertension   . Depression   . Alcoholism     recovering since 2000  . PONV (postoperative nausea and vomiting)   . Other and unspecified general anesthetics causing adverse effect in therapeutic use post op delirium--  last anes record w/ chart  from   09-22-2009 (spinal w/ light sedation)  . Coronary artery disease CARDIOLOGIST- DR [place]--- LAST VISIT NOTE 09-07-2009  W/ CHART  . Inferior MI 2000--  POST PTCA W/ STENT X1  . Bipolar disorder   . Peripheral vascular disease POST RIGHT CAROTID SURG.  1995  . Status post carotid endarterectomy RIGHT --  1995  . Status post primary angioplasty with coronary stent 2000--  POST INFERIOR MI  . Blood transfusion   . Normal cardiac stress test 2008-- PER DR [place] NOTE 09-07-2009  . Arthritis BACK  . Chronic back pain   . Osteoporosis   . Rosacea LEFT FACIAL RASH  . Idiopathic acute facial nerve palsy LEFT SIDE--  BOTOX THERAPY  . Blepharospasm LEFT EYE  . History of alcohol abuse RECOVERING SINCE 2000  . Anxiety   . Unstable balance WALKS W/ CANE  . HTN (hypertension)   . Vaginal cancer   . Cervical cancer 12/28/11     vagina upper right bx==squamous cell ca  . Emphysema     Consultants: None  Procedures: None  Antibiotics: None  Subjective: Patient complains of back pain which is chronic for her but started after she started receiving radiation treatment. Some pain with BM and occasional blood on tissue. Still with some abdominal pain.  Objective: Vital Signs  Filed  Vitals:   03/05/12 1030 03/05/12 1040 03/05/12 1050 03/05/12 1107  BP: 151/77 160/85 156/89 181/89  Pulse:    65  Temp:    97.7 F (36.5 C)  TempSrc:    Oral  Resp: 16 16 12 18   Height:      Weight:      SpO2: 97% 100% 99% 100%    Intake/Output Summary (Last 24 hours) at 03/05/12 1202 Last data filed at 03/05/12 1025  Gross per 24 hour  Intake    640 ml  Output      0 ml  Net    640 ml   Filed Weights   03/02/12 1443 03/02/12 2100  Weight: 72.576 kg (160 lb) 73.483 kg (162 lb)   General appearance: alert, cooperative, appears stated age and no distress Resp: clear to auscultation bilaterally. Coarse breath sounds. Cardio: regular rate and rhythm, S1, S2 normal, no S3 or S4, systolic murmur: early systolic 3/6, crescendo at 2nd left intercostal space, no click and no rub GI: Soft, minimally tender today. BS present. No masses or organomegaly. Extremities: edema chronic edema 1+ Pulses: 2+ and symmetric Neurologic: Alert and oriented x3. No focal deficits.  Lab Results:  Basic Metabolic Panel:  Recent Labs Lab 03/02/12 1705 03/04/12 0556 03/05/12 0510  NA 135 133* 134*  K 3.5 3.6 3.5  CL 95* 98 97  CO2 27 24 29  GLUCOSE 97 81 116*  BUN 13 11 7   CREATININE 0.73 0.65 0.60  CALCIUM 10.1 9.3 9.1   Liver Function Tests:  Recent Labs Lab 03/02/12 1705 03/03/12 0554 03/04/12 0556 03/05/12 0510  AST 21 21 17 17   ALT 16 14 11 12   ALKPHOS 107 76 68 65  BILITOT 0.3 0.3 0.3 0.3  PROT 7.8 6.2 6.1 5.8*  ALBUMIN 3.7 3.1* 2.9* 2.7*    Recent Labs Lab 03/02/12 1705 03/03/12 0554 03/04/12 0556  LIPASE 66* 20 20   CBC:  Recent Labs Lab 03/02/12 1705 03/04/12 0556 03/05/12 0510  WBC 4.2 5.3 5.8  NEUTROABS 2.3  --   --   HGB 13.2 11.7* 11.3*  HCT 38.3 34.4* 33.4*  MCV 90.8 91.7 92.3  PLT 197 169 144*     Studies/Results: Dg Chest 2 View  03/03/2012  *RADIOLOGY REPORT*  Clinical Data: Hypoxia.  Hypertension.  Prior smoker.  CHEST - 2 VIEW   Comparison: 02/08/2009  Findings: There is hyperinflation of the lungs compatible with COPD.  Interstitial prominence throughout the lungs, most pronounced in the bases, likely chronic interstitial changes and scarring.  Heart is borderline in size.  No effusions.  No acute bony abnormality. Postsurgical changes in the proximal right humerus.  IMPRESSION: COPD/chronic changes.  No acute findings.   Original Report Authenticated By: Charlett Nose, M.D.     Medications:  Scheduled: . ARIPiprazole  15 mg Oral Daily  . aspirin EC  81 mg Oral Daily  . cloNIDine  0.2 mg Oral BID  . hydrocortisone cream   Topical BID  . lamoTRIgine  200 mg Oral BID  . mometasone-formoterol  2 puff Inhalation BID  . pantoprazole  40 mg Oral Q0600  . sertraline  50 mg Oral Daily  . tiotropium  18 mcg Inhalation Daily   Continuous:  ZOX:WRUE & mag hydroxide-simeth, clonazePAM, hydrALAZINE, HYDROcodone-acetaminophen, morphine injection, ondansetron (ZOFRAN) IV, ondansetron  Assessment/Plan:  Principal Problem:   Abnormal abdominal CT scan Active Problems:   Carotid artery disease   Cancer of vaginal vault - recurrent from cervix   Alcoholism   Bipolar disorder   Abdominal pain, acute, epigastric   Hypoxia   Former smoker   COPD (chronic obstructive pulmonary disease)   Unspecified gastritis and gastroduodenitis without mention of hemorrhage    RUQ Abdominal Pain MRCP report reviewed. Appreciate GI input. EUS done today. Dilated SBD but no other abnormality. LFT and Lipase normal. PPI. Diet advanced. Will see how she does.  Left Renal Mass Suspicious for RCC. Discussed with patient. She will need further work up for this as OP. I have discussed with Dr. Mena Goes with Urology and he recommends follow up with him in his office.   Hypoxia No respiratory distress. Denies shortness of breath or chest pain. CXR does show chronic lung changes. She is a former smoker and likely has COPD. Will initiate Dulera and  Spiriva. Doesn't have wheezes. Recheck O2 sats prior to discharge to see if she needs home O2.  LE Edema Per patient has been present for last many years. Have asked her to discuss with PCP. No DVt seen on dopplers.  Back Pain ? Secondary to radiation. Controlled with vicodin. Continue present treatment and follow up with PCP. No neurological deficits.  History of Vaginal Cancer S/P radiation treatment recently.  History of HTN Bp elevated likely because she didn't get her meds this morning. Will order PRn hydralazine. Monitor.  History of Bipolar Disorder Continue home medications  Code  Status: Full Code DVT Prophylaxis: Heparin  Family Communication: Discussed with patient  Disposition Plan: Likely return home when improved. Anticipate dc 2/18    LOS: 3 days   [facility]  [facility] Pager [phone] 03/05/2012, 12:02 PM  If 8PM-8AM, please contact night-coverage at [email or web address], password [facility]

## 2012-03-05 NOTE — Anesthesia Preprocedure Evaluation (Addendum)
Anesthesia Evaluation  Patient identified by MRN, date of birth, ID band Patient awake  General Assessment Comment:H/O postop delirium.  Reviewed: Allergy & Precautions, H&P , NPO status , Patient's Chart, lab work & pertinent test results  History of Anesthesia Complications (+) PONV and AWARENESS UNDER ANESTHESIA  Airway Mallampati: II TM Distance: >3 FB Neck ROM: Full    Dental no notable dental hx. (+) Dental Advisory Given   Pulmonary COPDformer smoker,  breath sounds clear to auscultation  Pulmonary exam normal       Cardiovascular hypertension, Pt. on medications + CAD, + Past MI and + Peripheral Vascular Disease Rhythm:Regular Rate:Normal  Denies chest pains. S/P stent in 2000. Quit smoking 3 weeks ago. Nicoderm on. Facial nerve palsy. EKG with SB 45, anterior MI evidence.   Neuro/Psych PSYCHIATRIC DISORDERS Anxiety Depression  Neuromuscular disease    GI/Hepatic negative GI ROS, Neg liver ROS, H/o alcoholism, bipolar d/o, facial nerve palsy.   Endo/Other  negative endocrine ROS  Renal/GU negative Renal ROS     Musculoskeletal negative musculoskeletal ROS (+)   Abdominal   Peds  Hematology negative hematology ROS (+)   Anesthesia Other Findings   Reproductive/Obstetrics                         Anesthesia Physical  Anesthesia Plan  ASA: III  Anesthesia Plan: MAC   Post-op Pain Management:    Induction: Intravenous  Airway Management Planned:   Additional Equipment:   Intra-op Plan:   Post-operative Plan:   Informed Consent: I have reviewed the patients History and Physical, chart, labs and discussed the procedure including the risks, benefits and alternatives for the proposed anesthesia with the patient or authorized representative who has indicated his/her understanding and acceptance.   Dental advisory given  Plan Discussed with: CRNA  Anesthesia Plan Comments:         Anesthesia Quick Evaluation

## 2012-03-05 NOTE — Preoperative (Signed)
Beta Blockers   Reason not to administer Beta Blockers:Not Applicable 

## 2012-03-05 NOTE — Op Note (Signed)
[facility] [address] [place], [address]   ENDOSCOPIC ULTRASOUND PROCEDURE REPORT  PATIENT: [name], [name]  MR#: [id number] BIRTHDATE: [birth year]  GENDER: Female ENDOSCOPIST: Rachael Fee, MD REFERRED BY:  Roxy Cedar, M.D. PROCEDURE DATE:  03/05/2012 PROCEDURE:   Upper EUS ASA CLASS:      Class III INDICATIONS:   1.  lower abdominal pain, recent pelvic XRT; dilated CBD, normal GB, normal LFTs. MEDICATIONS: MAC sedation, administered by CRNA DESCRIPTION OF PROCEDURE:   After the risks benefits and alternatives of the procedure were  explained, informed consent was obtained. The patient was then placed in the left, lateral, decubitus postion and IV sedation was administered. Throughout the procedure, the patients blood pressure, pulse and oxygen saturations were monitored continuously.  Under direct visualization, the Pentax EUS Radial [id number]  endoscope was introduced through the mouth  and advanced to the second portion of the duodenum .  Water was used as necessary to provide an acoustic interface.  Upon completion of the imaging, water was removed and the patient was sent to the recovery room in satisfactory condition.  Endoscopic findings (with radial echoendoscope and side viewing duodenoscope): 1. Moderate pan-gastritis, non-specific. Mucosal biopsies were taken and sent to pathology. 2. Otherwise normal UGI tract including good views of normal major papilla.  EUS findings: 1. Dilated CBD (up to 9.25mm) without fillinf defects or associated masses. 2. Normal gallbladder. 3. Pancreatic parenchyma was normal throughout; no discrete masses and no signs of chronic pancreatitis 4. Normal main pancreatic duct 5. No peripancreatic adenopathy. 6. Limited views of livers, spleen, portal and splenic vessels were all normal.  Impression: Dilated but otherwise normal CBD.  This is of unlcear clinical significance and in setting of normal  LFTs and pain that is not typical for biliary pain should be followed clinically.  Moderate gastritis may be from daily NSAIDs, if biopsies are positive for H. pylori will start her on appropriate antibiotics.  Perhaps her lower abdominal pain is from recent pelvic XRT?  She has a renal mass that needs formal evaluation given that it is "very suspicious" for RCC per radiologist review.  Will advance diet and follow clinically. She was not taking PPI prior to admit but should be on at least once daily PPI for as long as she requires 2-4 NSAID pills per day.   _______________________________ eSigned:  Rachael Fee, MD 03/05/2012 10:23 AM

## 2012-03-05 NOTE — Anesthesia Postprocedure Evaluation (Signed)
Anesthesia Post Note  Patient: [name]  Procedure(s) Performed: Procedure(s) (LRB): FULL UPPER ENDOSCOPIC ULTRASOUND (EUS) RADIAL and EGD (N/A)  Anesthesia type: MAC  Patient location: PACU  Post pain: Pain level controlled  Post assessment: Patient's Cardiovascular Status Stable  Last Vitals:  Filed Vitals:   03/05/12 1030  BP: 151/77  Pulse:   Temp:   Resp: 16    Post vital signs: Reviewed and stable  Level of consciousness: alert  Complications: No apparent anesthesia complications

## 2012-03-06 ENCOUNTER — Encounter (HOSPITAL_COMMUNITY): Payer: Self-pay | Admitting: Gastroenterology

## 2012-03-06 DIAGNOSIS — C52 Malignant neoplasm of vagina: Secondary | ICD-10-CM | POA: Diagnosis not present

## 2012-03-06 DIAGNOSIS — K2970 Gastritis, unspecified, without bleeding: Secondary | ICD-10-CM | POA: Diagnosis not present

## 2012-03-06 DIAGNOSIS — I10 Essential (primary) hypertension: Secondary | ICD-10-CM | POA: Diagnosis not present

## 2012-03-06 DIAGNOSIS — F319 Bipolar disorder, unspecified: Secondary | ICD-10-CM | POA: Diagnosis not present

## 2012-03-06 DIAGNOSIS — R52 Pain, unspecified: Secondary | ICD-10-CM | POA: Diagnosis not present

## 2012-03-06 DIAGNOSIS — R935 Abnormal findings on diagnostic imaging of other abdominal regions, including retroperitoneum: Secondary | ICD-10-CM | POA: Diagnosis not present

## 2012-03-06 DIAGNOSIS — K2990 Gastroduodenitis, unspecified, without bleeding: Secondary | ICD-10-CM

## 2012-03-06 DIAGNOSIS — R1013 Epigastric pain: Secondary | ICD-10-CM | POA: Diagnosis not present

## 2012-03-06 MED ORDER — ALBUTEROL SULFATE HFA 108 (90 BASE) MCG/ACT IN AERS: 2.0000 | INHALATION_SPRAY | Freq: Four times a day (QID) | RESPIRATORY_TRACT | Status: DC | PRN

## 2012-03-06 MED ORDER — MOMETASONE FURO-FORMOTEROL FUM 100-5 MCG/ACT IN AERO: 2.0000 | INHALATION_SPRAY | Freq: Two times a day (BID) | RESPIRATORY_TRACT | Status: DC

## 2012-03-06 MED ORDER — TIOTROPIUM BROMIDE MONOHYDRATE 18 MCG IN CAPS: 18.0000 ug | ORAL_CAPSULE | Freq: Every day | RESPIRATORY_TRACT | Status: DC

## 2012-03-06 MED ORDER — HYDROCORTISONE 1 % EX CREA: TOPICAL_CREAM | Freq: Two times a day (BID) | CUTANEOUS | Status: DC

## 2012-03-06 MED ORDER — HYDROCODONE-ACETAMINOPHEN 5-325 MG PO TABS
1.0000 | ORAL_TABLET | ORAL | Status: DC | PRN
Start: 2012-03-06 — End: 2012-07-11

## 2012-03-06 NOTE — Discharge Summary (Signed)
[facility]  Physician Discharge Summary   Patient ID: [name] MRN: [id number] DOB/AGE: Apr 13, 1945 67 y.o.  Admit date: 03/02/2012 Discharge date: 03/06/2012  PCP: Cala Bradford, MD  DISCHARGE DIAGNOSES:  Principal Problem:   Abnormal abdominal CT scan Active Problems:   Carotid artery disease   Cancer of vaginal vault - recurrent from cervix   Alcoholism   Bipolar disorder   Abdominal pain, acute, epigastric   Hypoxia   Former smoker   COPD (chronic obstructive pulmonary disease)   Unspecified gastritis and gastroduodenitis without mention of hemorrhage   RECOMMENDATIONS FOR OUTPATIENT FOLLOW UP: 1. Needs to see Urology for left renal mass 2. Will get a call from GI Re gastric biopsy 3. Will need to reschedule appt with ortho for back pain.  DISCHARGE CONDITION: fair  Diet recommendation: Low Sodium  Filed Weights   03/02/12 1443 03/02/12 2100  Weight: 72.576 kg (160 lb) 73.483 kg (162 lb)    INITIAL HISTORY: 67 yo female with one to two weeks of ruq/epigastric abd pain without any n/v which is not associated with eating. No fevers. No diarrhea. Pain resolved with treatment in ED. Ct abd shows show dilated ducts and needs mrcp. Still has gallbladder. No cp or sob.   Consultations:  [facility]  Phone call with Dr. Mena Goes  Procedures:  EGD/EUS 2/17 Endoscopic findings (with radial echoendoscope and side viewing duodenoscope):  1. Moderate pan-gastritis, non-specific. Mucosal biopsies were taken and sent to pathology.  2. Otherwise normal UGI tract including good views of normal major papilla.  EUS findings:  1. Dilated CBD (up to 9.60mm) without fillinf defects or associated masses.  2. Normal gallbladder.  3. Pancreatic parenchyma was normal throughout; no discrete masses and no signs of chronic pancreatitis  4. Normal main pancreatic duct  5. No peripancreatic adenopathy.  6. Limited views of livers, spleen, portal and splenic vessels were  all normal.   HOSPITAL COURSE:   RUQ Abdominal Pain  Patient was admitted to the hospital and underwent Ct and MRCP. Please see findings above. For the dilated biliary ducts GI was consulted. She then underwent EUS. No other abnormalities were noted. Pain is better but persists. PPI for gastritis. Gastric biopsies were taken. She will need to discuss with her radiation oncologist to see if radiation could be causing her symptoms. If she tolerates her lunch she scan be discharged home. LFT's and Lipase were normal.  Left Renal Mass  This was noted incidentally on Ct and MRCP. Suspicious for RCC. Discussed with patient. She will need further work up for this as OP. I have discussed with Dr. Mena Goes with Urology and he recommends follow up with him in his office.   Hypoxia /COPD This was noted on routine vitals. No chest pain or shortness of breath. CXR revealed COPD changes. She is a former heavy smoker. Initiated inhalers and her oxygenation has improved. She will not require home O2. Dulera and Spiriva. Albuterol as needed.  LE Edema  Per patient has been present for last many years. Have asked her to discuss with PCP. No DVt seen on dopplers.   Back Pain  ? Secondary to radiation. No neuro deficits at all. Had an appointment with Ortho but was hospitalized. She will reschedule. Hydrocodone for pain PRN.     History of Vaginal Cancer  S/P radiation treatment recently.   History of HTN  Continue with home regimen.   History of Bipolar Disorder  Continue home medications   Overall patient is  stable.She could be discharged later today if she tolerates her lunch. Her friend lives with her. All questions were answered.   PERTINENT LABS:  The results of significant diagnostics from this hospitalization (including imaging, microbiology, ancillary and laboratory) are listed below for reference.     Labs: Basic Metabolic Panel:  Recent Labs Lab 03/02/12 1705 03/04/12 0556  03/05/12 0510  NA 135 133* 134*  K 3.5 3.6 3.5  CL 95* 98 97  CO2 27 24 29   GLUCOSE 97 81 116*  BUN 13 11 7   CREATININE 0.73 0.65 0.60  CALCIUM 10.1 9.3 9.1   Liver Function Tests:  Recent Labs Lab 03/02/12 1705 03/03/12 0554 03/04/12 0556 03/05/12 0510  AST 21 21 17 17   ALT 16 14 11 12   ALKPHOS 107 76 68 65  BILITOT 0.3 0.3 0.3 0.3  PROT 7.8 6.2 6.1 5.8*  ALBUMIN 3.7 3.1* 2.9* 2.7*    Recent Labs Lab 03/02/12 1705 03/03/12 0554 03/04/12 0556  LIPASE 66* 20 20   CBC:  Recent Labs Lab 03/02/12 1705 03/04/12 0556 03/05/12 0510  WBC 4.2 5.3 5.8  NEUTROABS 2.3  --   --   HGB 13.2 11.7* 11.3*  HCT 38.3 34.4* 33.4*  MCV 90.8 91.7 92.3  PLT 197 169 144*    IMAGING STUDIES Dg Chest 2 View  03/03/2012  *RADIOLOGY REPORT*  Clinical Data: Hypoxia.  Hypertension.  Prior smoker.  CHEST - 2 VIEW  Comparison: 02/08/2009  Findings: There is hyperinflation of the lungs compatible with COPD.  Interstitial prominence throughout the lungs, most pronounced in the bases, likely chronic interstitial changes and scarring.  Heart is borderline in size.  No effusions.  No acute bony abnormality. Postsurgical changes in the proximal right humerus.  IMPRESSION: COPD/chronic changes.  No acute findings.   Original Report Authenticated By: Charlett Nose, M.D.    Ct Abdomen Pelvis W Contrast  03/02/2012  *RADIOLOGY REPORT*  Clinical Data: Abdominal pain  CT ABDOMEN AND PELVIS WITH CONTRAST  Technique:  Multidetector CT imaging of the abdomen and pelvis was performed following the standard protocol during bolus administration of intravenous contrast.  Contrast: 80mL OMNIPAQUE IOHEXOL 300 MG/ML  SOLN  Comparison: 01/12/2012  Findings: The lung bases appear clear.  No pericardial or pleural effusion.  Interstitial change of COPD is identified.  Prominent calcifications involving the coronary arteries noted.  No focal liver abnormality noted.  The gallbladder appears normal. There is mild  intrahepatic biliary prominence.  The common bile duct measures up to 7 mm. The pancreatic duct measures up to 4 mm, image 28.  This is new since the previous exam.  The spleen is normal.  There is a small cyst within the upper pole of the right kidney. Intermediate attenuating cyst arising from the upper pole of the left kidney may have enhancement.  This measures approximately 3.30 cm, image 18.  Hounsfield units equal 35.  On the noncontrast CT from 01/12/2012 the Hounsfield units equal 12.  The lower pelvis is obscured by beam hardening artifact from the patient's bilateral hip arthroplasty device.  There is advanced calcified atherosclerotic change affecting the abdominal aorta and its branches.  No aneurysm noted.  There is no upper abdominal adenopathy.  No pelvic or inguinal adenopathy.  No free fluid or fluid collections identified within the abdomen or pelvis.  The stomach appears normal.  The small bowel loops have a normal course and caliber without evidence for obstruction.  The appendix is visualized and appears normal.  Proximal colon is normal. Multiple distal colonic diverticula identified.  Review of the visualized osseous structures is significant for osteopenia, scoliosis and mild multilevel spondylosis.  There are compression deformities involving the T11 and L4 vertebra. This is similar to 01/28/2009.  IMPRESSION:  1.  There is mild increased caliber of the intrahepatic ducts and common bile duct.  The pancreatic duct is also slightly increased in caliber from previous exam.  Difficult to exclude choledocholithiasis.  If there is clinical concern for common bile duct obstruction consider correlation with liver function tests. If further imaging is clinically indicated an MRCP would be recommended. 2.  Complicated cyst arising from the upper pole of the left kidney.  When compared with the exam from 01/12/2012 this cyst may exhibit enhancement.  Advise further evaluation with contrast enhanced  MRI.   Original Report Authenticated By: Signa Kell, M.D.    Mr 3d Recon At Scanner  03/03/2012  *RADIOLOGY REPORT*  Clinical Data:  Abdominal pain.  Evaluate for common bile duct dilatation.  Evaluate for possible choledocholithiasis.  MRI ABDOMEN WITHOUT AND WITH CONTRAST (INCLUDING MRCP)  Technique:  Multiplanar multisequence MR imaging of the abdomen was performed both before and after the administration of intravenous contrast. Heavily T2-weighted images of the biliary and pancreatic ducts were obtained, and three-dimensional MRCP images were rendered by post processing.  Contrast: 15mL MULTIHANCE GADOBENATE DIMEGLUMINE 529 MG/ML IV SOLN  Comparison:  CT of the abdomen and pelvis 03/02/2012.  Findings:  In the posterior aspect of the upper pole of the left kidney there is a 4.1 x 3.8 x 4.4 cm lesion that is heterogeneous in signal intensity on pre gadolinium imaging, generally T2 hyperintense, but with some central T2 hypointense material that is isointense on pre gadolinium T1-weighted images, but clearly enhances on post-gadolinium T1-weighted images, concerning for a cystic renal neoplasm.  There are also subcentimeter simple cysts in the lower pole of the left kidney, and in the medial aspect of the upper pole of the right kidney. A circumaortic left renal vein (normal anatomical variant) incidentally noted.  The gallbladder is only moderately distended.  No definite gallstones are identified.  Common bile duct is dilated up to 13 mm in the porta hepatis.  This abruptly terminates before the level of the ampulla.  No definite filling defects are noted within the common bile duct to suggest presence of choledocholithiasis at this time.  There is only minimal intrahepatic biliary ductal dilatation.  Pancreatic duct is normal in caliber, and there are no focal pancreatic lesions.  The appearance of the spleen and bilateral adrenal glands is unremarkable. A Tarlov cyst in the right side of the sacrum  incidentally noted.  IMPRESSION: 1.  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  No associated perirenal lymphadenopathy at this time.  This appears to be completely confined within Gerota's fascia. Notably, there does appear to be a circumaortic left renal vein (normal anatomical variant). 2.  Marked dilatation of the common bile duct (13 mm) with mild intrahepatic biliary ductal dilatation.  No choledocholithiasis. This common bile duct dilatation appears to abruptly terminate immediately above the level of the ampulla. No pancreatic head mass is identified, and there is no dilatation of the pancreatic duct. Clinical correlation for signs and symptoms of ductal stricture is recommended.  3.  Additional incidental findings, as above.   Original Report Authenticated By:  Trudie Reed, M.D.    Mr Abd W/wo Cm/mrcp  03/03/2012  *RADIOLOGY REPORT*  Clinical Data:  Abdominal pain.  Evaluate for common bile duct dilatation.  Evaluate for possible choledocholithiasis.  MRI ABDOMEN WITHOUT AND WITH CONTRAST (INCLUDING MRCP)  Technique:  Multiplanar multisequence MR imaging of the abdomen was performed both before and after the administration of intravenous contrast. Heavily T2-weighted images of the biliary and pancreatic ducts were obtained, and three-dimensional MRCP images were rendered by post processing.  Contrast: 15mL MULTIHANCE GADOBENATE DIMEGLUMINE 529 MG/ML IV SOLN  Comparison:  CT of the abdomen and pelvis 03/02/2012.  Findings:  In the posterior aspect of the upper pole of the left kidney there is a 4.1 x 3.8 x 4.4 cm lesion that is heterogeneous in signal intensity on pre gadolinium imaging, generally T2 hyperintense, but with some central T2 hypointense material that is isointense on pre gadolinium T1-weighted images, but clearly enhances on post-gadolinium T1-weighted images, concerning for  a cystic renal neoplasm.  There are also subcentimeter simple cysts in the lower pole of the left kidney, and in the medial aspect of the upper pole of the right kidney. A circumaortic left renal vein (normal anatomical variant) incidentally noted.  The gallbladder is only moderately distended.  No definite gallstones are identified.  Common bile duct is dilated up to 13 mm in the porta hepatis.  This abruptly terminates before the level of the ampulla.  No definite filling defects are noted within the common bile duct to suggest presence of choledocholithiasis at this time.  There is only minimal intrahepatic biliary ductal dilatation.  Pancreatic duct is normal in caliber, and there are no focal pancreatic lesions.  The appearance of the spleen and bilateral adrenal glands is unremarkable. A Tarlov cyst in the right side of the sacrum incidentally noted.  IMPRESSION: 1.  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  No associated perirenal lymphadenopathy at this time.  This appears to be completely confined within Gerota's fascia. Notably, there does appear to be a circumaortic left renal vein (normal anatomical variant). 2.  Marked dilatation of the common bile duct (13 mm) with mild intrahepatic biliary ductal dilatation.  No choledocholithiasis. This common bile duct dilatation appears to abruptly terminate immediately above the level of the ampulla. No pancreatic head mass is identified, and there is no dilatation of the pancreatic duct. Clinical correlation for signs and symptoms of ductal stricture is recommended.  3.  Additional incidental findings, as above.   Original Report Authenticated By: Trudie Reed, M.D.     DISCHARGE EXAMINATION: Filed Vitals:   03/05/12 1300 03/05/12 1405 03/05/12 2150 03/06/12 0500  BP:  98/62 128/77 121/77  Pulse:  74 63 62  Temp:  97.4 F (36.3 C) 98.8  F (37.1 C) 98.1 F (36.7 C)  TempSrc:  Oral Oral Oral  Resp:  18 20 18   Height:      Weight:      SpO2: 100% 90% 92% 92%   General appearance: alert, cooperative, appears stated age and no distress Resp: clear to auscultation bilaterally Cardio: regular rate and rhythm, S1, S2 normal, no murmur, click, rub or gallop GI: soft, vague tenderness occasionally. bowel sounds normal; no masses,  no organomegaly Neurologic: Alert and oriented X 3, normal strength and tone. Normal symmetric reflexes. Normal coordination and gait  DISPOSITION: Home  Discharge Orders  Future Appointments Provider Department Dept Phone   03/07/2012 9:00 AM Lonie Peak, MD [facility] [phone]   Future Orders Complete By Expires     Diet - low sodium heart healthy  As directed     Discharge instructions  As directed     Comments:      Please reschedule your appointment with [facility] for the back pain.    Increase activity slowly  As directed       Current Discharge Medication List    START taking these medications   Details  albuterol (PROVENTIL HFA;VENTOLIN HFA) 108 (90 BASE) MCG/ACT inhaler Inhale 2 puffs into the lungs every 6 (six) hours as needed for wheezing. Qty: 1 Inhaler, Refills: 2    HYDROcodone-acetaminophen (NORCO/VICODIN) 5-325 MG per tablet Take 1-2 tablets by mouth every 4 (four) hours as needed for pain. Qty: 15 tablet, Refills: 0    hydrocortisone cream 1 % Apply topically 2 (two) times daily. Qty: 30 g, Refills: 0    mometasone-formoterol (DULERA) 100-5 MCG/ACT AERO Inhale 2 puffs into the lungs 2 (two) times daily. Qty: 1 Inhaler, Refills: 3    tiotropium (SPIRIVA) 18 MCG inhalation capsule Place 1 capsule (18 mcg total) into inhaler and inhale daily. Qty: 30 capsule, Refills: 3      CONTINUE these medications which have NOT CHANGED   Details  ARIPiprazole (ABILIFY) 15 MG tablet Take 15 mg by mouth daily.     aspirin EC 81 MG tablet  Take 81 mg by mouth daily.    calcium-vitamin D (OSCAL WITH D) 500-200 MG-UNIT per tablet Take 1 tablet by mouth daily.     Cholecalciferol (VITAMIN D3) 2000 UNITS TABS Take 1 capsule by mouth daily.     clonazePAM (KLONOPIN) 1 MG tablet Take 1 mg by mouth 3 (three) times daily as needed for anxiety.     cloNIDine (CATAPRES) 0.2 MG tablet Take 0.2 mg by mouth 2 (two) times daily.     lamoTRIgine (LAMICTAL) 100 MG tablet Take 200 mg by mouth 2 (two) times daily.     Multiple Vitamin (MULTIVITAMIN WITH MINERALS) TABS Take 1 tablet by mouth daily.    olmesartan-hydrochlorothiazide (BENICAR HCT) 40-25 MG per tablet Take 1 tablet by mouth daily.    sertraline (ZOLOFT) 50 MG tablet Take 50 mg by mouth daily.     simvastatin (ZOCOR) 10 MG tablet Take 10 mg by mouth at bedtime.     esomeprazole (NEXIUM) 20 MG capsule Take 20 mg by mouth daily before breakfast.       Follow-up Information   Follow up with Mena Goes Lowella Petties, MD. Schedule an appointment as soon as possible for a visit in 2 weeks. (Tell the office that you have a mass in left kidney and was discussed with Dr. Mena Goes.)    Contact information:   [address] [place] [address] [phone]       Call Yancey Flemings, MD. (As needed for abdominal pain)    Contact information:   [address] [place] [address] [phone]       Schedule an appointment as soon as possible for a visit with Cala Bradford, MD. (post hospitalization visit)    Contact information:   [address] [place] [address] [phone]       TOTAL DISCHARGE TIME: 35 mins  [facility]  [facility] Pager [phone]  03/06/2012, 10:01 AM

## 2012-03-06 NOTE — Progress Notes (Signed)
[facility] Progress Note    Since last GI note: EUS yesterday, full report in EPIC, essentially no clear cause of her lower abd pains.  Did have gastritis which I biopsied to check for h. Pylori  No abd pain overnight.  + back pain.    Objective: Vital signs in last 24 hours: Temp:  [97.1 F (36.2 C)-98.8 F (37.1 C)] 98.1 F (36.7 C) (02/18 0500) Pulse Rate:  [58-74] 62 (02/18 0500) Resp:  [12-28] 18 (02/18 0500) BP: (98-181)/(62-89) 121/77 mmHg (02/18 0500) SpO2:  [90 %-100 %] 92 % (02/18 0500) Last BM Date: 03/03/12 General: alert and oriented times 3 Heart: regular rate and rythm Abdomen: soft, non-tender, non-distended, normal bowel sounds   Lab Results:  Recent Labs  03/04/12 0556 03/05/12 0510  WBC 5.3 5.8  HGB 11.7* 11.3*  PLT 169 144*  MCV 91.7 92.3    Recent Labs  03/04/12 0556 03/05/12 0510  NA 133* 134*  K 3.6 3.5  CL 98 97  CO2 24 29  GLUCOSE 81 116*  BUN 11 7  CREATININE 0.65 0.60  CALCIUM 9.3 9.1    Recent Labs  03/04/12 0556 03/05/12 0510  PROT 6.1 5.8*  ALBUMIN 2.9* 2.7*  AST 17 17  ALT 11 12  ALKPHOS 68 65  BILITOT 0.3 0.3    Medications: Scheduled Meds: . ARIPiprazole  15 mg Oral Daily  . aspirin EC  81 mg Oral Daily  . cloNIDine  0.2 mg Oral BID  . hydrocortisone cream   Topical BID  . lamoTRIgine  200 mg Oral BID  . mometasone-formoterol  2 puff Inhalation BID  . pantoprazole  40 mg Oral Q0600  . sertraline  50 mg Oral Daily  . tiotropium  18 mcg Inhalation Daily   Continuous Infusions:  PRN Meds:.alum & mag hydroxide-simeth, clonazePAM, hydrALAZINE, HYDROcodone-acetaminophen, morphine injection, ondansetron (ZOFRAN) IV, ondansetron    Assessment/Plan: 67 y.o. female improved abd pains  Etiology not completely clear.  Pelvic XRT may contribute.  GAstrititis may contribute (biopsied to check for H. Pylori), she takes advil daily and should cut back as best that she can and take PPI daily.  The left  renal mass should be evaluated (urology consult?)  OK to d/c home from GI perspective if she tolerates diet.  Should follow up with Dr. Marina Goodell from [facility] as needed. I will contact her with gastric biopsy results.  Rob Bunting, MD  03/06/2012, 7:29 AM [facility] Pager [phone]

## 2012-03-06 NOTE — Progress Notes (Signed)
Pt tolerated lunch without c/o nausea and vomiting.

## 2012-03-07 ENCOUNTER — Encounter (HOSPITAL_COMMUNITY): Payer: Self-pay | Admitting: Emergency Medicine

## 2012-03-07 ENCOUNTER — Emergency Department (HOSPITAL_COMMUNITY)
Admission: EM | Admit: 2012-03-07 | Discharge: 2012-03-07 | Disposition: A | Discharge status: Home / Self Care | Payer: Medicare Other | Attending: Emergency Medicine | Admitting: Emergency Medicine

## 2012-03-07 ENCOUNTER — Encounter: Payer: Self-pay | Admitting: Radiation Oncology

## 2012-03-07 ENCOUNTER — Ambulatory Visit
Admission: RE | Admit: 2012-03-07 | Discharge: 2012-03-07 | Disposition: A | Discharge status: Home / Self Care | Payer: Medicare Other | Source: Ambulatory Visit | Attending: Radiation Oncology | Admitting: Radiation Oncology

## 2012-03-07 VITALS — BP 85/56 | HR 67 | Temp 98.1°F | Ht 69.5 in

## 2012-03-07 DIAGNOSIS — Z8544 Personal history of malignant neoplasm of other female genital organs: Secondary | ICD-10-CM | POA: Diagnosis not present

## 2012-03-07 DIAGNOSIS — C52 Malignant neoplasm of vagina: Secondary | ICD-10-CM

## 2012-03-07 DIAGNOSIS — I10 Essential (primary) hypertension: Secondary | ICD-10-CM | POA: Diagnosis not present

## 2012-03-07 DIAGNOSIS — F411 Generalized anxiety disorder: Secondary | ICD-10-CM | POA: Diagnosis not present

## 2012-03-07 DIAGNOSIS — Z8619 Personal history of other infectious and parasitic diseases: Secondary | ICD-10-CM | POA: Insufficient documentation

## 2012-03-07 DIAGNOSIS — F319 Bipolar disorder, unspecified: Secondary | ICD-10-CM | POA: Diagnosis not present

## 2012-03-07 DIAGNOSIS — Z8541 Personal history of malignant neoplasm of cervix uteri: Secondary | ICD-10-CM | POA: Diagnosis not present

## 2012-03-07 DIAGNOSIS — I2510 Atherosclerotic heart disease of native coronary artery without angina pectoris: Secondary | ICD-10-CM | POA: Diagnosis not present

## 2012-03-07 DIAGNOSIS — N390 Urinary tract infection, site not specified: Secondary | ICD-10-CM | POA: Diagnosis not present

## 2012-03-07 DIAGNOSIS — R5381 Other malaise: Secondary | ICD-10-CM | POA: Diagnosis not present

## 2012-03-07 DIAGNOSIS — Z923 Personal history of irradiation: Secondary | ICD-10-CM | POA: Diagnosis not present

## 2012-03-07 DIAGNOSIS — Z8739 Personal history of other diseases of the musculoskeletal system and connective tissue: Secondary | ICD-10-CM | POA: Diagnosis not present

## 2012-03-07 DIAGNOSIS — Z87891 Personal history of nicotine dependence: Secondary | ICD-10-CM | POA: Diagnosis not present

## 2012-03-07 DIAGNOSIS — I252 Old myocardial infarction: Secondary | ICD-10-CM | POA: Diagnosis not present

## 2012-03-07 DIAGNOSIS — Z79899 Other long term (current) drug therapy: Secondary | ICD-10-CM | POA: Diagnosis not present

## 2012-03-07 DIAGNOSIS — IMO0002 Reserved for concepts with insufficient information to code with codable children: Secondary | ICD-10-CM | POA: Diagnosis not present

## 2012-03-07 DIAGNOSIS — Z8679 Personal history of other diseases of the circulatory system: Secondary | ICD-10-CM | POA: Diagnosis not present

## 2012-03-07 DIAGNOSIS — G8929 Other chronic pain: Secondary | ICD-10-CM | POA: Insufficient documentation

## 2012-03-07 DIAGNOSIS — M549 Dorsalgia, unspecified: Secondary | ICD-10-CM | POA: Diagnosis not present

## 2012-03-07 DIAGNOSIS — Z9861 Coronary angioplasty status: Secondary | ICD-10-CM | POA: Diagnosis not present

## 2012-03-07 HISTORY — DX: Personal history of irradiation: Z92.3

## 2012-03-07 HISTORY — DX: Other specified disorders of kidney and ureter: N28.89

## 2012-03-07 LAB — COMPREHENSIVE METABOLIC PANEL
ALT: 20 U/L (ref 0–35)
AST: 31 U/L (ref 0–37)
CO2: 28 mEq/L (ref 19–32)
Chloride: 97 mEq/L (ref 96–112)
GFR calc Af Amer: >90 mL/min (ref >90)
GFR calc non Af Amer: 88 mL/min — ABNORMAL LOW (ref >90)
Glucose, Bld: 102 mg/dL — ABNORMAL HIGH (ref 70–99)
Sodium: 135 mEq/L (ref 135–145)
Total Bilirubin: 0.3 mg/dL (ref 0.3–1.2)

## 2012-03-07 LAB — CBC WITH DIFFERENTIAL/PLATELET
Basophils Absolute: 0 10*3/uL (ref 0.0–0.1)
Basophils Relative: 0 % (ref 0–1)
Eosinophils Absolute: 0.3 10*3/uL (ref 0.0–0.7)
MCH: 32.3 pg (ref 26.0–34.0)
MCHC: 34.7 g/dL (ref 30.0–36.0)
Neutrophils Relative %: 76 % (ref 43–77)
Platelets: 188 10*3/uL (ref 150–400)
RDW: 13.3 % (ref 11.5–15.5)

## 2012-03-07 LAB — URINALYSIS, ROUTINE W REFLEX MICROSCOPIC: Bilirubin Urine: NEGATIVE

## 2012-03-07 LAB — URINE MICROSCOPIC-ADD ON

## 2012-03-07 LAB — LIPASE, BLOOD: Lipase: 56 U/L (ref 11–59)

## 2012-03-07 MED ORDER — NITROFURANTOIN MONOHYD MACRO 100 MG PO CAPS: 100.0000 mg | ORAL_CAPSULE | Freq: Two times a day (BID) | ORAL | Status: DC

## 2012-03-07 MED ORDER — NITROFURANTOIN MONOHYD MACRO 100 MG PO CAPS
100.0000 mg | ORAL_CAPSULE | Freq: Once | ORAL | Status: AC
  Administered 2012-03-07: 100 mg via ORAL
  Filled 2012-03-07: qty 1

## 2012-03-07 NOTE — ED Notes (Signed)
Pt discharge from hospital yesterday, pt from radiation c/o of hypertension and leg weakness. Unable to bear weight, "afraid they won't hold me up".

## 2012-03-07 NOTE — ED Notes (Signed)
[facility] called to transport pt back home 

## 2012-03-07 NOTE — ED Provider Notes (Signed)
History     CSN: [id number]  Arrival date & time 03/07/12  1033   First MD Initiated Contact with Patient 03/07/12 1039      Chief Complaint  Patient presents with  . Back Pain  . Extremity Weakness    (Consider location/radiation/quality/duration/timing/severity/associated sxs/prior treatment) HPI .Marland KitchenMarland KitchenMarland Kitchen. weakness for several days.  Patient discharged from hospital yesterday for complications from vaginal cancer.  She states she cannot walk secondary to prolonged immobilization.  No fever, sweats, chills, cough, stiff neck.  Complains of chronic low left posterior pelvic pain   Past Medical History  Diagnosis Date  . Hypertension   . Depression   . Alcoholism     recovering since 2000  . PONV (postoperative nausea and vomiting)   . Other and unspecified general anesthetics causing adverse effect in therapeutic use post op delirium--  last anes record w/ chart  from   09-22-2009 (spinal w/ light sedation)  . Coronary artery disease CARDIOLOGIST- DR [place]--- LAST VISIT NOTE 09-07-2009  W/ CHART  . Inferior MI 2000--  POST PTCA W/ STENT X1  . Bipolar disorder   . Peripheral vascular disease POST RIGHT CAROTID SURG.  1995  . Status post carotid endarterectomy RIGHT --  1995  . Status post primary angioplasty with coronary stent 2000--  POST INFERIOR MI  . Blood transfusion   . Normal cardiac stress test 2008-- PER DR [place] NOTE 09-07-2009  . Arthritis BACK  . Chronic back pain   . Osteoporosis   . Rosacea LEFT FACIAL RASH  . Idiopathic acute facial nerve palsy LEFT SIDE--  BOTOX THERAPY  . Blepharospasm LEFT EYE  . History of alcohol abuse RECOVERING SINCE 2000  . Anxiety   . Unstable balance WALKS W/ CANE  . HTN (hypertension)   . Vaginal cancer   . Cervical cancer 12/28/11     vagina upper right bx==squamous cell ca  . Emphysema   . S/P radiation therapy     HDR 5 Fractions to Upper Vaginal Area  . Left renal mass 03/02/12    Past Surgical History  Procedure  Laterality Date  . Total knee arthroplasty  09-22-2009    RIGHT  . Vaginal hysterectomy  07-06-2009  . Hemiarthroplasty hip  12-26-2008    LEFT FEMORAL NECK FX  . Total hip arthroplasty  04-15-2008    POST FAILED  RIGHT HIP ORIF FEMORAL FX  . Cervical conization w/bx  09-23-2008  . Orif hip fracture  02-13-2007    RIGHT FEMORAL NECK FX  . Right shoulder surg.  2007  . Coronary angioplasty with stent placement  2000-   INFERIOR MI    X1 STENT TO RCA  . Carotid endarterectomy  1995    RIGHT  . Cataract extraction w/ intraocular lens  implant, bilateral    . Orif right distal radius and right proximal humerous neck fx's  10-10-2005  . Upper right vaginal region  12/28/11    BIOPSY: SQUAMOUS CELL CARCINOMA  . Eus N/A 03/05/2012    Procedure: FULL UPPER ENDOSCOPIC ULTRASOUND (EUS) RADIAL and EGD;  Surgeon: Rachael Fee, MD;  Location: [facility];  Service: Endoscopy;  Laterality: N/A;  ercp scope first than eus scope    Family History  Problem Relation Age of Onset  . Hypertension Mother   . Hypertension Father     History  Substance Use Topics  . Smoking status: Former Smoker -- 2.00 packs/day for 50 years    Types: Cigarettes    Quit date:  01/18/2011  . Smokeless tobacco: Never Used     Comment: STATES QUIT SMOKING 01-18-2011  . Alcohol Use: No     Comment: RECOVERING ALCOHOLIC--   QUIT IN 2000    OB History   Grav Para Term Preterm Abortions TAB SAB Ect Mult Living                  Review of Systems  All other systems reviewed and are negative.    Allergies  Review of patient's allergies indicates no known allergies.  Home Medications   Current Outpatient Rx  Name  Route  Sig  Dispense  Refill  . ARIPiprazole (ABILIFY) 15 MG tablet   Oral   Take 15 mg by mouth daily.          Marland Kitchen aspirin EC 81 MG tablet   Oral   Take 81 mg by mouth daily.         . calcium-vitamin D (OSCAL WITH D) 500-200 MG-UNIT per tablet   Oral   Take 1 tablet by mouth  daily.          . Cholecalciferol (VITAMIN D3) 2000 UNITS TABS   Oral   Take 1 capsule by mouth daily.          . clonazePAM (KLONOPIN) 1 MG tablet   Oral   Take 1 mg by mouth 3 (three) times daily as needed for anxiety.          . cloNIDine (CATAPRES) 0.2 MG tablet   Oral   Take 0.2 mg by mouth 2 (two) times daily.          Marland Kitchen HYDROcodone-acetaminophen (NORCO/VICODIN) 5-325 MG per tablet   Oral   Take 1-2 tablets by mouth every 4 (four) hours as needed for pain.   15 tablet   0   . hydrocortisone cream 1 %   Topical   Apply topically 2 (two) times daily.   30 g   0   . lamoTRIgine (LAMICTAL) 100 MG tablet   Oral   Take 200 mg by mouth 2 (two) times daily.          . mometasone-formoterol (DULERA) 100-5 MCG/ACT AERO   Inhalation   Inhale 2 puffs into the lungs 2 (two) times daily.   1 Inhaler   3   . Multiple Vitamin (MULTIVITAMIN WITH MINERALS) TABS   Oral   Take 1 tablet by mouth daily.         Marland Kitchen olmesartan-hydrochlorothiazide (BENICAR HCT) 40-25 MG per tablet   Oral   Take 1 tablet by mouth daily.         . sertraline (ZOLOFT) 50 MG tablet   Oral   Take 50 mg by mouth daily.          . simvastatin (ZOCOR) 10 MG tablet   Oral   Take 10 mg by mouth at bedtime.          Marland Kitchen tiotropium (SPIRIVA) 18 MCG inhalation capsule   Inhalation   Place 1 capsule (18 mcg total) into inhaler and inhale daily.   30 capsule   3   . albuterol (PROVENTIL HFA;VENTOLIN HFA) 108 (90 BASE) MCG/ACT inhaler   Inhalation   Inhale 2 puffs into the lungs every 6 (six) hours as needed for wheezing.   1 Inhaler   2   . esomeprazole (NEXIUM) 20 MG capsule   Oral   Take 20 mg by mouth daily before breakfast.         .  nitrofurantoin, macrocrystal-monohydrate, (MACROBID) 100 MG capsule   Oral   Take 1 capsule (100 mg total) by mouth 2 (two) times daily. X 7 days   14 capsule   0     BP 132/67  Pulse 77  Temp(Src) 98.1 F (36.7 C) (Oral)  Resp 17  SpO2  90%  Physical Exam  Nursing note and vitals reviewed. Constitutional: She is oriented to person, place, and time. She appears well-developed and well-nourished.  Blood pressure has normalized  HENT:  Head: Normocephalic and atraumatic.  Eyes: Conjunctivae and EOM are normal. Pupils are equal, round, and reactive to light.  Neck: Normal range of motion. Neck supple.  Cardiovascular: Normal rate, regular rhythm and normal heart sounds.   Pulmonary/Chest: Effort normal and breath sounds normal.  Abdominal: Soft. Bowel sounds are normal.  Musculoskeletal: Normal range of motion.  Neurological: She is alert and oriented to person, place, and time.  Skin: Skin is warm and dry.  Psychiatric: She has a normal mood and affect.    ED Course  Procedures (including critical care time)  Labs Reviewed  COMPREHENSIVE METABOLIC PANEL - Abnormal; Notable for the following:    Glucose, Bld 102 (*)    Albumin 2.9 (*)    GFR calc non Af Amer 88 (*)    All other components within normal limits  CBC WITH DIFFERENTIAL - Abnormal; Notable for the following:    RBC 3.41 (*)    Hemoglobin 11.0 (*)    HCT 31.7 (*)    All other components within normal limits  URINALYSIS, ROUTINE W REFLEX MICROSCOPIC - Abnormal; Notable for the following:    Hgb urine dipstick TRACE (*)    Leukocytes, UA MODERATE (*)    All other components within normal limits  URINE MICROSCOPIC-ADD ON - Abnormal; Notable for the following:    Squamous Epithelial / LPF FEW (*)    Bacteria, UA FEW (*)    All other components within normal limits  URINE CULTURE  LIPASE, BLOOD   No results found.   1. Urinary tract infection       MDM  Patient reports no major changes since discharge yesterday.  She is deconditioned and having trouble walking.  This is not a new finding. Will treat her urinary tract infection with Macrobid. I offered to admit patient again. She prefers to go home.  She has a 24-hour caregiver at home.  Pulse  ox in the low 90s. Patient has no clinical evidence of pneumonia.        Donnetta Hutching, MD 03/07/12 1410

## 2012-03-07 NOTE — ED Notes (Signed)
ZOX:[id number]<AV> Expected date:<BR> Expected time:<BR> Means of arrival:<BR> Comments:<BR> Kluender, marylynn

## 2012-03-07 NOTE — Progress Notes (Signed)
Escorted patient to emergency room per orders from Dr. Basilio Cairo.

## 2012-03-07 NOTE — Progress Notes (Addendum)
[name] in exam room 7.  Just discharged form the hospital on yesterday.  She states she needs to be readmitted to the hospital because of the continued pain in her mid-back and because of her inability to ambulate and stand without assistance.  She was unable to weigh today because of her inability to stand.  Her O2 Sat is 85% today on RA and she is hypotensive.  Sats in the hospital were in the low 90's on average.   9:31am.  O2 Sat now 90% on RA and HR 63

## 2012-03-07 NOTE — Progress Notes (Signed)
Radiation Oncology         [phone] ________________________________  Name: [name] MRN: [id number]  Date: 03/07/2012  DOB: 1945/02/17  Follow-Up Visit Note  Outpatient  CC: Cala Bradford, MD  Thompson Caul., MD  Diagnosis:   Squamous cell carcinoma at the vaginal cuff - recurrent cervical cancer presenting as stage I vaginal cancer   She is status post 38.75 Gray in 5 fractions, high dose rate vaginal cuff brachytherapy, 5.5 cm length of proximal vagina treated. She completed treatment on 02/21/2012  Narrative:  The patient returns today for routine follow-up.         She was discharged yesterday from the hospital. She had multiple issues. She presented with abdominal pain as well as back pain. Imaging revealed she was found to have a growing mass on her kidney which is suspicious for renal cell carcinoma. She also had unspecified gastritis and gastroduodenitis without mention of hemorrhage. Upper endoscopic ultrasound revealed dilated but otherwise normal,common bile duct. Gallbladder was normal. Pancreatic parenchyma was normal throughout. She also has back pain of unclear etiology. At hospitalization there was documentation of no neurologic deficits. Her orthopedic outpatient appointment needs to be rescheduled for that. She needs to see urology for the left renal mass.   Today, she reports that she is feeling worse since getting home. Her back pain is worse, and she is having much more trouble moving her legs. She reports no vaginal bleeding. No escalation of bleeding with bowel movements, but occasionally has some blood with bowel movements. She reports constipation x3 days. No dysuria. She has a little bit of pain in her lower left pelvis. The right upper quadrant and epigastric pain in her abdomen have gone away since hospitalization.    She was originally found to have a pulse oximetry of 85% on room air in our clinic, this rose to 90%. She is hypotensive , but good PO  intake.       ALLERGIES:  has No Known Allergies.  Meds: Current Outpatient Prescriptions  Medication Sig Dispense Refill  . albuterol (PROVENTIL HFA;VENTOLIN HFA) 108 (90 BASE) MCG/ACT inhaler Inhale 2 puffs into the lungs every 6 (six) hours as needed for wheezing.  1 Inhaler  2  . ARIPiprazole (ABILIFY) 15 MG tablet Take 15 mg by mouth daily.       Marland Kitchen aspirin EC 81 MG tablet Take 81 mg by mouth daily.      . calcium-vitamin D (OSCAL WITH D) 500-200 MG-UNIT per tablet Take 1 tablet by mouth daily.       . Cholecalciferol (VITAMIN D3) 2000 UNITS TABS Take 1 capsule by mouth daily.       . clonazePAM (KLONOPIN) 1 MG tablet Take 1 mg by mouth 3 (three) times daily as needed for anxiety.       . cloNIDine (CATAPRES) 0.2 MG tablet Take 0.2 mg by mouth 2 (two) times daily.       Marland Kitchen HYDROcodone-acetaminophen (NORCO/VICODIN) 5-325 MG per tablet Take 1-2 tablets by mouth every 4 (four) hours as needed for pain.  15 tablet  0  . hydrocortisone cream 1 % Apply topically 2 (two) times daily.  30 g  0  . mometasone-formoterol (DULERA) 100-5 MCG/ACT AERO Inhale 2 puffs into the lungs 2 (two) times daily.  1 Inhaler  3  . Multiple Vitamin (MULTIVITAMIN WITH MINERALS) TABS Take 1 tablet by mouth daily.      Marland Kitchen olmesartan-hydrochlorothiazide (BENICAR HCT) 40-25 MG per tablet Take 1  tablet by mouth daily.      . sertraline (ZOLOFT) 50 MG tablet Take 50 mg by mouth daily.       . simvastatin (ZOCOR) 10 MG tablet Take 10 mg by mouth at bedtime.       Marland Kitchen tiotropium (SPIRIVA) 18 MCG inhalation capsule Place 1 capsule (18 mcg total) into inhaler and inhale daily.  30 capsule  3  . esomeprazole (NEXIUM) 20 MG capsule Take 20 mg by mouth daily before breakfast.      . lamoTRIgine (LAMICTAL) 100 MG tablet Take 200 mg by mouth 2 (two) times daily.        No current facility-administered medications for this encounter.    Physical Findings: The patient is in no acute distress. Patient is alert and oriented.  height  is 5' 9.5" (1.765 m). Her temperature is 98.1 F (36.7 C). Her blood pressure is 85/56 and her pulse is 67. Her oxygen saturation is 85%. .  No significant changes. Pelvic exam deferred. Slight tenderness to palpation in the left lower pelvis. Abdomen nontender nondistended.  Lab Findings: Lab Results  Component Value Date   WBC 5.8 03/05/2012   HGB 11.3* 03/05/2012   HCT 33.4* 03/05/2012   MCV 92.3 03/05/2012   PLT 144* 03/05/2012    CMP     Component Value Date/Time   NA 134* 03/05/2012 0510   K 3.5 03/05/2012 0510   CL 97 03/05/2012 0510   CO2 29 03/05/2012 0510   GLUCOSE 116* 03/05/2012 0510   BUN 7 03/05/2012 0510   CREATININE 0.60 03/05/2012 0510   CALCIUM 9.1 03/05/2012 0510   PROT 5.8* 03/05/2012 0510   ALBUMIN 2.7* 03/05/2012 0510   AST 17 03/05/2012 0510   ALT 12 03/05/2012 0510   ALKPHOS 65 03/05/2012 0510   BILITOT 0.3 03/05/2012 0510   GFRNONAA >90 03/05/2012 0510   GFRAA >90 03/05/2012 0510      Radiographic Findings: Dg Chest 2 View  03/03/2012  *RADIOLOGY REPORT*  Clinical Data: Hypoxia.  Hypertension.  Prior smoker.  CHEST - 2 VIEW  Comparison: 02/08/2009  Findings: There is hyperinflation of the lungs compatible with COPD.  Interstitial prominence throughout the lungs, most pronounced in the bases, likely chronic interstitial changes and scarring.  Heart is borderline in size.  No effusions.  No acute bony abnormality. Postsurgical changes in the proximal right humerus.  IMPRESSION: COPD/chronic changes.  No acute findings.   Original Report Authenticated By: Charlett Nose, M.D.    Ct Abdomen Pelvis W Contrast  03/02/2012  *RADIOLOGY REPORT*  Clinical Data: Abdominal pain  CT ABDOMEN AND PELVIS WITH CONTRAST  Technique:  Multidetector CT imaging of the abdomen and pelvis was performed following the standard protocol during bolus administration of intravenous contrast.  Contrast: 80mL OMNIPAQUE IOHEXOL 300 MG/ML  SOLN  Comparison: 01/12/2012  Findings: The lung bases appear clear.   No pericardial or pleural effusion.  Interstitial change of COPD is identified.  Prominent calcifications involving the coronary arteries noted.  No focal liver abnormality noted.  The gallbladder appears normal. There is mild intrahepatic biliary prominence.  The common bile duct measures up to 7 mm. The pancreatic duct measures up to 4 mm, image 28.  This is new since the previous exam.  The spleen is normal.  There is a small cyst within the upper pole of the right kidney. Intermediate attenuating cyst arising from the upper pole of the left kidney may have enhancement.  This measures approximately 3.30 cm, image  18.  Hounsfield units equal 35.  On the noncontrast CT from 01/12/2012 the Hounsfield units equal 12.  The lower pelvis is obscured by beam hardening artifact from the patient's bilateral hip arthroplasty device.  There is advanced calcified atherosclerotic change affecting the abdominal aorta and its branches.  No aneurysm noted.  There is no upper abdominal adenopathy.  No pelvic or inguinal adenopathy.  No free fluid or fluid collections identified within the abdomen or pelvis.  The stomach appears normal.  The small bowel loops have a normal course and caliber without evidence for obstruction.  The appendix is visualized and appears normal.  Proximal colon is normal. Multiple distal colonic diverticula identified.  Review of the visualized osseous structures is significant for osteopenia, scoliosis and mild multilevel spondylosis.  There are compression deformities involving the T11 and L4 vertebra. This is similar to 01/28/2009.  IMPRESSION:  1.  There is mild increased caliber of the intrahepatic ducts and common bile duct.  The pancreatic duct is also slightly increased in caliber from previous exam.  Difficult to exclude choledocholithiasis.  If there is clinical concern for common bile duct obstruction consider correlation with liver function tests. If further imaging is clinically indicated an  MRCP would be recommended. 2.  Complicated cyst arising from the upper pole of the left kidney.  When compared with the exam from 01/12/2012 this cyst may exhibit enhancement.  Advise further evaluation with contrast enhanced MRI.   Original Report Authenticated By: Signa Kell, M.D.    Mr 3d Recon At Scanner  03/03/2012  *RADIOLOGY REPORT*  Clinical Data:  Abdominal pain.  Evaluate for common bile duct dilatation.  Evaluate for possible choledocholithiasis.  MRI ABDOMEN WITHOUT AND WITH CONTRAST (INCLUDING MRCP)  Technique:  Multiplanar multisequence MR imaging of the abdomen was performed both before and after the administration of intravenous contrast. Heavily T2-weighted images of the biliary and pancreatic ducts were obtained, and three-dimensional MRCP images were rendered by post processing.  Contrast: 15mL MULTIHANCE GADOBENATE DIMEGLUMINE 529 MG/ML IV SOLN  Comparison:  CT of the abdomen and pelvis 03/02/2012.  Findings:  In the posterior aspect of the upper pole of the left kidney there is a 4.1 x 3.8 x 4.4 cm lesion that is heterogeneous in signal intensity on pre gadolinium imaging, generally T2 hyperintense, but with some central T2 hypointense material that is isointense on pre gadolinium T1-weighted images, but clearly enhances on post-gadolinium T1-weighted images, concerning for a cystic renal neoplasm.  There are also subcentimeter simple cysts in the lower pole of the left kidney, and in the medial aspect of the upper pole of the right kidney. A circumaortic left renal vein (normal anatomical variant) incidentally noted.  The gallbladder is only moderately distended.  No definite gallstones are identified.  Common bile duct is dilated up to 13 mm in the porta hepatis.  This abruptly terminates before the level of the ampulla.  No definite filling defects are noted within the common bile duct to suggest presence of choledocholithiasis at this time.  There is only minimal intrahepatic biliary  ductal dilatation.  Pancreatic duct is normal in caliber, and there are no focal pancreatic lesions.  The appearance of the spleen and bilateral adrenal glands is unremarkable. A Tarlov cyst in the right side of the sacrum incidentally noted.  IMPRESSION: 1.  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  No associated perirenal lymphadenopathy at this time.  This appears to be completely confined within Gerota's fascia. Notably, there does appear to be a circumaortic left renal vein (normal anatomical variant). 2.  Marked dilatation of the common bile duct (13 mm) with mild intrahepatic biliary ductal dilatation.  No choledocholithiasis. This common bile duct dilatation appears to abruptly terminate immediately above the level of the ampulla. No pancreatic head mass is identified, and there is no dilatation of the pancreatic duct. Clinical correlation for signs and symptoms of ductal stricture is recommended.  3.  Additional incidental findings, as above.   Original Report Authenticated By: Trudie Reed, M.D.    Mr Abd W/wo Cm/mrcp  03/03/2012  *RADIOLOGY REPORT*  Clinical Data:  Abdominal pain.  Evaluate for common bile duct dilatation.  Evaluate for possible choledocholithiasis.  MRI ABDOMEN WITHOUT AND WITH CONTRAST (INCLUDING MRCP)  Technique:  Multiplanar multisequence MR imaging of the abdomen was performed both before and after the administration of intravenous contrast. Heavily T2-weighted images of the biliary and pancreatic ducts were obtained, and three-dimensional MRCP images were rendered by post processing.  Contrast: 15mL MULTIHANCE GADOBENATE DIMEGLUMINE 529 MG/ML IV SOLN  Comparison:  CT of the abdomen and pelvis 03/02/2012.  Findings:  In the posterior aspect of the upper pole of the left kidney there is a 4.1 x 3.8 x 4.4 cm lesion that is heterogeneous in signal intensity on pre  gadolinium imaging, generally T2 hyperintense, but with some central T2 hypointense material that is isointense on pre gadolinium T1-weighted images, but clearly enhances on post-gadolinium T1-weighted images, concerning for a cystic renal neoplasm.  There are also subcentimeter simple cysts in the lower pole of the left kidney, and in the medial aspect of the upper pole of the right kidney. A circumaortic left renal vein (normal anatomical variant) incidentally noted.  The gallbladder is only moderately distended.  No definite gallstones are identified.  Common bile duct is dilated up to 13 mm in the porta hepatis.  This abruptly terminates before the level of the ampulla.  No definite filling defects are noted within the common bile duct to suggest presence of choledocholithiasis at this time.  There is only minimal intrahepatic biliary ductal dilatation.  Pancreatic duct is normal in caliber, and there are no focal pancreatic lesions.  The appearance of the spleen and bilateral adrenal glands is unremarkable. A Tarlov cyst in the right side of the sacrum incidentally noted.  IMPRESSION: 1.  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  No associated perirenal lymphadenopathy at this time.  This appears to be completely confined within Gerota's fascia. Notably, there does appear to be a circumaortic left renal vein (normal anatomical variant). 2.  Marked dilatation of the common bile duct (13 mm) with mild intrahepatic biliary ductal dilatation.  No choledocholithiasis. This common bile duct dilatation appears to abruptly terminate immediately above the level of the ampulla. No pancreatic head mass is identified, and there is no dilatation of the pancreatic duct. Clinical correlation for signs and symptoms of ductal stricture is recommended.  3.  Additional incidental findings, as above.   Original  Report Authenticated By: Trudie Reed, M.D.     Impression/Plan: I'm so sorry that she is having so much trouble. I do NOT think that her symptoms are subacute reactions due to her focused radiotherapy. This was focused on the vaginal  vault only. The risk of small bowel toxicity in the pelvis, to explain her pain, is relatively low, and is not in keeping with the overall clinical picture right now. The patient and her friend were given a vaginal dilator to use a few times a week to prevent adhesions and allow for less painful GYN exams in the future. Due to her hypotension, worsening back pain, and complaint of dramatic worsening of lower extremity weakness, and she was sent to the emergency room. Patient feels she cannot manage at home. I will schedule her for followup in approximately 6 months. We will refer her back to Dr. Duard Brady as well.  I spent 10 minutes minutes face to face with the patient and more than 50% of that time was spent in counseling and/or coordination of care. _____________________________________   Lonie Peak, MD

## 2012-03-08 LAB — URINE CULTURE

## 2012-03-09 ENCOUNTER — Ambulatory Visit: Payer: Medicare Other | Admitting: Radiation Oncology

## 2012-03-14 DIAGNOSIS — M47817 Spondylosis without myelopathy or radiculopathy, lumbosacral region: Secondary | ICD-10-CM | POA: Diagnosis not present

## 2012-03-16 ENCOUNTER — Ambulatory Visit: Payer: Medicare Other | Admitting: Radiation Oncology

## 2012-03-18 DIAGNOSIS — N390 Urinary tract infection, site not specified: Secondary | ICD-10-CM | POA: Diagnosis not present

## 2012-04-09 DIAGNOSIS — G894 Chronic pain syndrome: Secondary | ICD-10-CM | POA: Diagnosis not present

## 2012-04-09 DIAGNOSIS — Z79899 Other long term (current) drug therapy: Secondary | ICD-10-CM | POA: Diagnosis not present

## 2012-04-09 DIAGNOSIS — M545 Low back pain: Secondary | ICD-10-CM | POA: Diagnosis not present

## 2012-04-16 ENCOUNTER — Encounter (INDEPENDENT_AMBULATORY_CARE_PROVIDER_SITE_OTHER): Payer: Medicare Other

## 2012-04-16 DIAGNOSIS — I6529 Occlusion and stenosis of unspecified carotid artery: Secondary | ICD-10-CM

## 2012-04-18 ENCOUNTER — Other Ambulatory Visit: Payer: Self-pay

## 2012-04-18 DIAGNOSIS — K2990 Gastroduodenitis, unspecified, without bleeding: Secondary | ICD-10-CM | POA: Diagnosis not present

## 2012-04-18 DIAGNOSIS — F39 Unspecified mood [affective] disorder: Secondary | ICD-10-CM | POA: Diagnosis not present

## 2012-04-18 DIAGNOSIS — C52 Malignant neoplasm of vagina: Secondary | ICD-10-CM | POA: Diagnosis not present

## 2012-04-18 DIAGNOSIS — G245 Blepharospasm: Secondary | ICD-10-CM | POA: Diagnosis not present

## 2012-04-18 DIAGNOSIS — I10 Essential (primary) hypertension: Secondary | ICD-10-CM | POA: Diagnosis not present

## 2012-04-18 DIAGNOSIS — N289 Disorder of kidney and ureter, unspecified: Secondary | ICD-10-CM | POA: Diagnosis not present

## 2012-04-18 DIAGNOSIS — E785 Hyperlipidemia, unspecified: Secondary | ICD-10-CM | POA: Diagnosis not present

## 2012-04-18 DIAGNOSIS — K2970 Gastritis, unspecified, without bleeding: Secondary | ICD-10-CM | POA: Diagnosis not present

## 2012-04-18 DIAGNOSIS — M545 Low back pain: Secondary | ICD-10-CM | POA: Diagnosis not present

## 2012-04-27 DIAGNOSIS — M47817 Spondylosis without myelopathy or radiculopathy, lumbosacral region: Secondary | ICD-10-CM | POA: Diagnosis not present

## 2012-05-07 DIAGNOSIS — N289 Disorder of kidney and ureter, unspecified: Secondary | ICD-10-CM | POA: Diagnosis not present

## 2012-05-08 DIAGNOSIS — G894 Chronic pain syndrome: Secondary | ICD-10-CM | POA: Diagnosis not present

## 2012-05-08 DIAGNOSIS — M545 Low back pain: Secondary | ICD-10-CM | POA: Diagnosis not present

## 2012-05-09 DIAGNOSIS — G245 Blepharospasm: Secondary | ICD-10-CM | POA: Diagnosis not present

## 2012-05-24 ENCOUNTER — Ambulatory Visit: Payer: Medicare Other | Attending: Gynecologic Oncology | Admitting: Gynecologic Oncology

## 2012-05-24 ENCOUNTER — Other Ambulatory Visit (HOSPITAL_COMMUNITY)
Admission: RE | Admit: 2012-05-24 | Discharge: 2012-05-24 | Disposition: A | Discharge status: Home / Self Care | Payer: Medicare Other | Source: Ambulatory Visit | Attending: Gynecologic Oncology | Admitting: Gynecologic Oncology

## 2012-05-24 ENCOUNTER — Encounter: Payer: Self-pay | Admitting: Gynecologic Oncology

## 2012-05-24 VITALS — BP 110/70 | HR 60 | Temp 98.0°F | Resp 16 | Ht 65.5 in | Wt 157.6 lb

## 2012-05-24 DIAGNOSIS — C649 Malignant neoplasm of unspecified kidney, except renal pelvis: Secondary | ICD-10-CM | POA: Insufficient documentation

## 2012-05-24 DIAGNOSIS — Z124 Encounter for screening for malignant neoplasm of cervix: Secondary | ICD-10-CM | POA: Diagnosis not present

## 2012-05-24 DIAGNOSIS — I789 Disease of capillaries, unspecified: Secondary | ICD-10-CM | POA: Insufficient documentation

## 2012-05-24 DIAGNOSIS — N8000 Endometriosis of the uterus, unspecified: Secondary | ICD-10-CM | POA: Insufficient documentation

## 2012-05-24 DIAGNOSIS — B977 Papillomavirus as the cause of diseases classified elsewhere: Secondary | ICD-10-CM | POA: Diagnosis not present

## 2012-05-24 DIAGNOSIS — C52 Malignant neoplasm of vagina: Secondary | ICD-10-CM

## 2012-05-24 NOTE — Progress Notes (Signed)
Consult Note: Gyn-Onc  [name] 67 y.o. female  CC:  Chief Complaint  Patient presents with  . Vaginal Cancer    Follow up    HPI: [name] is a very pleasant 67 year old with a 10 year history of abnormal Pap smears. In April 2011 she had a Pap smear revealed atypical squamous cells cannot exclude a high-grade dysplasia. She was worked up and evaluated and subsequently underwent a vaginal hysterectomy in June of 2011. Pathology at that time revealed the cervix with high-grade squamous dysplasia. Focal adenomyosis. In addition, the ectocervical margin of the cervix was positive. She was seen by Dr. Arlyce Dice on October 29 at which time a Pap smear revealed VAIN2/VAIN 3.  She underwent laser ablation on for dysplasia on January 22. Operative findings included a raised hyperkeratotic lesion in the vagina encompassing the entire vaginal cuff. The area was acetowhite changing. The total area measured approximately 4 x 4 centimeters. I saw her for posttreatment exam on February 21 first at which time she had healed quite well. She has been using her Premarin cream since that time.   I saw her in June of this year which time her Pap smear was normal. She subsequently saw Dr. Arlyce Dice October 30 and her Pap smear returned as atypical squamous cells of undetermined significance. The patient has positive high-risk HPV. Since she saw me in June when she got the nose abnormal Pap smear she subsequently stopped using her estrogen cranium. She does complain of a vaginal discharge there is no pruritus or burning there is no blood. She had the discharge when she saw Dr. Arlyce Dice but the testing he did "did not show any evidence of infection". She otherwise has no complaints.   I saw her last week at which time on exam there is no acetowhite epithelial changes at the top of the vagina. However, there is a punctate area with telangiectasias in the upper right vaginal fornix. A biopsy of this was performed. It  revealed a squamous cell carcinoma.   She completed 5 fractions of HDR with Dr. Karoline Caldwell. She was ultimately diagnosed with a renal cell carcinoma and is seeing Dr. Laverle Patter on May 27. She last saw Dr. Karoline Caldwell on February 19. At that time she was not feeling very well but soon thereafter has been completely normal and feels great. She is using her vaginal dilator as directed by Dr. Karoline Caldwell daily. She's had no bleeding.  Review of Systems  Constitutional: No complaints Skin: No rash, sores, jaundice, itching, or dryness.  Cardiovascular: No chest pain, shortness of breath, or edema  Pulmonary: No cough or wheeze.  Gastro Intestinal: No nausea, vomiting, constipation, or diarrhea reported. No bright red blood per rectum or change in bowel movement.  Genitourinary: No frequency, urgency, or dysuria.  Denies vaginal bleeding and discharge.  Musculoskeletal: No myalgia, arthralgia, joint swelling or pain.  Neurologic: No weakness, numbness, or change in gait.  Psychology: No issues '  Current Meds:  Outpatient Encounter Prescriptions as of 05/24/2012  Medication Sig Dispense Refill  . albuterol (PROVENTIL HFA;VENTOLIN HFA) 108 (90 BASE) MCG/ACT inhaler Inhale 2 puffs into the lungs every 6 (six) hours as needed for wheezing.  1 Inhaler  2  . ARIPiprazole (ABILIFY) 15 MG tablet Take 15 mg by mouth daily.       Marland Kitchen aspirin EC 81 MG tablet Take 81 mg by mouth daily.      . calcium-vitamin D (OSCAL WITH D) 500-200 MG-UNIT per tablet Take 1  tablet by mouth daily.       . Cholecalciferol (VITAMIN D3) 2000 UNITS TABS Take 1 capsule by mouth daily.       . clonazePAM (KLONOPIN) 1 MG tablet Take 1 mg by mouth 3 (three) times daily as needed for anxiety.       . cloNIDine (CATAPRES) 0.2 MG tablet Take 0.2 mg by mouth 2 (two) times daily.       Marland Kitchen esomeprazole (NEXIUM) 20 MG capsule Take 20 mg by mouth daily before breakfast.      . HYDROcodone-acetaminophen (NORCO/VICODIN) 5-325 MG per tablet Take 1-2 tablets by  mouth every 4 (four) hours as needed for pain.  15 tablet  0  . lamoTRIgine (LAMICTAL) 100 MG tablet Take 200 mg by mouth 2 (two) times daily.       . mometasone-formoterol (DULERA) 100-5 MCG/ACT AERO Inhale 2 puffs into the lungs 2 (two) times daily.  1 Inhaler  3  . Multiple Vitamin (MULTIVITAMIN WITH MINERALS) TABS Take 1 tablet by mouth daily.      Marland Kitchen olmesartan-hydrochlorothiazide (BENICAR HCT) 40-25 MG per tablet Take 1 tablet by mouth daily.      . Rosuvastatin Calcium (CRESTOR PO) Take by mouth at bedtime.      . sertraline (ZOLOFT) 50 MG tablet Take 50 mg by mouth daily.       Marland Kitchen tiotropium (SPIRIVA) 18 MCG inhalation capsule Place 1 capsule (18 mcg total) into inhaler and inhale daily.  30 capsule  3  . hydrocortisone cream 1 % Apply topically 2 (two) times daily.  30 g  0  . nitrofurantoin, macrocrystal-monohydrate, (MACROBID) 100 MG capsule Take 1 capsule (100 mg total) by mouth 2 (two) times daily. X 7 days  14 capsule  0  . [DISCONTINUED] simvastatin (ZOCOR) 10 MG tablet Take 10 mg by mouth at bedtime.        No facility-administered encounter medications on file as of 05/24/2012.    Allergy: No Known Allergies  Social Hx:   History   Social History  . Marital Status: Divorced    Spouse Name: N/A    Number of Children: N/A  . Years of Education: N/A   Occupational History  . Not on file.   Social History Main Topics  . Smoking status: Former Smoker -- 2.00 packs/day for 50 years    Types: Cigarettes    Quit date: 01/18/2011  . Smokeless tobacco: Never Used     Comment: STATES QUIT SMOKING 01-18-2011  . Alcohol Use: No     Comment: RECOVERING ALCOHOLIC--   QUIT IN 2000  . Drug Use: No  . Sexually Active: No   Other Topics Concern  . Not on file   Social History Narrative  . No narrative on file    Past Surgical Hx:  Past Surgical History  Procedure Laterality Date  . Total knee arthroplasty  09-22-2009    RIGHT  . Vaginal hysterectomy  07-06-2009  .  Hemiarthroplasty hip  12-26-2008    LEFT FEMORAL NECK FX  . Total hip arthroplasty  04-15-2008    POST FAILED  RIGHT HIP ORIF FEMORAL FX  . Cervical conization w/bx  09-23-2008  . Orif hip fracture  02-13-2007    RIGHT FEMORAL NECK FX  . Right shoulder surg.  2007  . Coronary angioplasty with stent placement  2000-   INFERIOR MI    X1 STENT TO RCA  . Carotid endarterectomy  1995    RIGHT  . Cataract extraction  w/ intraocular lens  implant, bilateral    . Orif right distal radius and right proximal humerous neck fx's  10-10-2005  . Upper right vaginal region  12/28/11    BIOPSY: SQUAMOUS CELL CARCINOMA  . Eus N/A 03/05/2012    Procedure: FULL UPPER ENDOSCOPIC ULTRASOUND (EUS) RADIAL and EGD;  Surgeon: Rachael Fee, MD;  Location: [facility];  Service: Endoscopy;  Laterality: N/A;  ercp scope first than eus scope    Past Medical Hx:  Past Medical History  Diagnosis Date  . Hypertension   . Depression   . Alcoholism     recovering since 2000  . PONV (postoperative nausea and vomiting)   . Other and unspecified general anesthetics causing adverse effect in therapeutic use post op delirium--  last anes record w/ chart  from   09-22-2009 (spinal w/ light sedation)  . Coronary artery disease CARDIOLOGIST- DR [place]and--- LAST VISIT NOTE 09-07-2009  W/ CHART  . Inferior MI 2000--  POST PTCA W/ STENT X1  . Bipolar disorder   . Peripheral vascular disease POST RIGHT CAROTID SURG.  1995  . Status post carotid endarterectomy RIGHT --  1995  . Status post primary angioplasty with coronary stent 2000--  POST INFERIOR MI  . Blood transfusion   . Normal cardiac stress test 2008-- PER DR Swaziland NOTE 09-07-2009  . Arthritis BACK  . Chronic back pain   . Osteoporosis   . Rosacea LEFT FACIAL RASH  . Idiopathic acute facial nerve palsy LEFT SIDE--  BOTOX THERAPY  . Blepharospasm LEFT EYE  . History of alcohol abuse RECOVERING SINCE 2000  . Anxiety   . Unstable balance WALKS W/ CANE  . HTN  (hypertension)   . Vaginal cancer   . Cervical cancer 12/28/11     vagina upper right bx==squamous cell ca  . Emphysema   . S/P radiation therapy     HDR 5 Fractions to Upper Vaginal Area  . Left renal mass 03/02/12    Family Hx:  Family History  Problem Relation Age of Onset  . Hypertension Mother   . Hypertension Father     Vitals:  Blood pressure 110/70, pulse 60, temperature 98 F (36.7 C), resp. rate 16, height 5' 5.5" (1.664 m), weight 157 lb 9.6 oz (71.487 kg).  Physical Exam:  Well-nourished well-developed female in no acute distress.   Neck: Supple, no lymphadenopathy, no thyromegaly.  Lungs: Clear to auscultation bilaterally.  Cardiovascular: Regular rate and rhythm.  Abdomen: Soft, nontender, nondistended. There is no palpable mass or prostatomegaly.  Groins: No lymphadenopathy.  Extremities no edema.  Pelvic: Normal external female genitalia. Vagina is markedly atrophic. Vaginal cuff is visualized is no visible lesions. ThinPrep Pap was submitted without difficulty. Bimanual examination reveals no masses or nodularity. Rectal confirms the  Assessment/Plan: 67 year old with a clinical stage I vaginal carcinoma treated with high-dose rate vaginal brachyherapy. She completed her treatment on February 21, 2012.  We'll followup in results for Pap smear from today. She has an appointment with Dr. Karoline Caldwell in August and will return to see me in November. She will followup with Dr. Laverle Patter as scheduled in May.  Cleda Mccreedy A., MD 05/24/2012, 3:21 PM

## 2012-06-01 ENCOUNTER — Telehealth: Payer: Self-pay | Admitting: Gynecologic Oncology

## 2012-06-01 DIAGNOSIS — C519 Malignant neoplasm of vulva, unspecified: Secondary | ICD-10-CM

## 2012-06-01 MED ORDER — ESTROGENS, CONJUGATED 0.625 MG/GM VA CREA: 0.5000 g | TOPICAL_CREAM | Freq: Every day | VAGINAL | Status: DC

## 2012-06-01 NOTE — Telephone Encounter (Signed)
Spoke with the patient about pap smear results and Dr. Denman George recommendations for a repeat pap smear in September 2014.  Appt scheduled for Sept. 4, 2014 at 10:30am.  Dr. Duard Brady also wanted the patient to continue using estrogen vaginally but patient states she needs a refill.  Refill for premarin vaginal cream will be sent to [facility] on [address] in [place].  No concerns voiced.  Instructed to call for any questions or concerns.

## 2012-06-07 ENCOUNTER — Other Ambulatory Visit: Payer: Self-pay | Admitting: Family Medicine

## 2012-06-07 ENCOUNTER — Ambulatory Visit
Admission: RE | Admit: 2012-06-07 | Discharge: 2012-06-07 | Disposition: A | Discharge status: Home / Self Care | Payer: Medicare Other | Source: Ambulatory Visit | Attending: Family Medicine | Admitting: Family Medicine

## 2012-06-07 DIAGNOSIS — Z1231 Encounter for screening mammogram for malignant neoplasm of breast: Secondary | ICD-10-CM | POA: Diagnosis not present

## 2012-06-12 ENCOUNTER — Other Ambulatory Visit (HOSPITAL_COMMUNITY): Payer: Self-pay | Admitting: Urology

## 2012-06-12 ENCOUNTER — Ambulatory Visit (HOSPITAL_COMMUNITY)
Admission: RE | Admit: 2012-06-12 | Discharge: 2012-06-12 | Disposition: A | Discharge status: Home / Self Care | Payer: Medicare Other | Source: Ambulatory Visit | Attending: Urology | Admitting: Urology

## 2012-06-12 DIAGNOSIS — D4959 Neoplasm of unspecified behavior of other genitourinary organ: Secondary | ICD-10-CM | POA: Diagnosis not present

## 2012-06-12 DIAGNOSIS — Z87891 Personal history of nicotine dependence: Secondary | ICD-10-CM | POA: Diagnosis not present

## 2012-06-12 DIAGNOSIS — I517 Cardiomegaly: Secondary | ICD-10-CM | POA: Diagnosis not present

## 2012-06-12 DIAGNOSIS — I10 Essential (primary) hypertension: Secondary | ICD-10-CM | POA: Insufficient documentation

## 2012-06-12 DIAGNOSIS — J449 Chronic obstructive pulmonary disease, unspecified: Secondary | ICD-10-CM | POA: Insufficient documentation

## 2012-06-12 DIAGNOSIS — C649 Malignant neoplasm of unspecified kidney, except renal pelvis: Secondary | ICD-10-CM | POA: Insufficient documentation

## 2012-06-12 DIAGNOSIS — J4489 Other specified chronic obstructive pulmonary disease: Secondary | ICD-10-CM | POA: Insufficient documentation

## 2012-06-12 DIAGNOSIS — R82998 Other abnormal findings in urine: Secondary | ICD-10-CM | POA: Diagnosis not present

## 2012-06-13 ENCOUNTER — Encounter: Payer: Self-pay | Admitting: Physician Assistant

## 2012-06-13 ENCOUNTER — Ambulatory Visit (INDEPENDENT_AMBULATORY_CARE_PROVIDER_SITE_OTHER): Payer: Medicare Other | Admitting: Physician Assistant

## 2012-06-13 VITALS — BP 138/88 | HR 60 | Ht 65.5 in | Wt 158.8 lb

## 2012-06-13 DIAGNOSIS — I779 Disorder of arteries and arterioles, unspecified: Secondary | ICD-10-CM

## 2012-06-13 DIAGNOSIS — I10 Essential (primary) hypertension: Secondary | ICD-10-CM

## 2012-06-13 DIAGNOSIS — I2510 Atherosclerotic heart disease of native coronary artery without angina pectoris: Secondary | ICD-10-CM

## 2012-06-13 DIAGNOSIS — R011 Cardiac murmur, unspecified: Secondary | ICD-10-CM | POA: Diagnosis not present

## 2012-06-13 DIAGNOSIS — Z01810 Encounter for preprocedural cardiovascular examination: Secondary | ICD-10-CM

## 2012-06-13 DIAGNOSIS — Z01818 Encounter for other preprocedural examination: Secondary | ICD-10-CM

## 2012-06-13 HISTORY — DX: Encounter for other preprocedural examination: Z01.818

## 2012-06-13 NOTE — Assessment & Plan Note (Signed)
The patient is here today for preoperative clearance before undergoing surgery on the left kidney mass by Dr. Laverle Patter. She has history of inferior MI and stenting of the RCA in 2000. She had a negative Lexiscan on 02/28/11 EF 69%. She has no cardiac symptoms and is walking a mile every day. I discussed the patient in detail with Dr. Graciela Husbands who agrees she needs no further cardiac testing, other than a 2-D echo because of heart murmurs, prior to surgery.

## 2012-06-13 NOTE — Assessment & Plan Note (Signed)
She has history of inferior MI and stenting of the RCA in 2000. She had a negative Lexiscan on 02/28/11 EF 69%. She has no cardiac symptoms and is walking a mile every day.

## 2012-06-13 NOTE — Patient Instructions (Addendum)
PLEASE SCHEDULE ECHO DX PRE SURG CLEARANCE AND SYSTOLIC AND DIASTOLIC MURMUR  NO CHANGES WERE MADE TODAY

## 2012-06-13 NOTE — Assessment & Plan Note (Signed)
stable °

## 2012-06-13 NOTE — Assessment & Plan Note (Signed)
She had right carotid endarterectomy in 1995. Most recent Dopplers in March 2014 showed no significant change. See above for details. Followup in one year.

## 2012-06-13 NOTE — Progress Notes (Signed)
HPI:   This is a 67 year old female patient of Dr. Swaziland who has history of inferior MI treated with stenting of the RCA in 2000. She had a negative Lexiscan on 02/28/11. She also had a right carotid endarterectomy in 1995 and a recent carotid Dopplers 04/16/12 showed no change and moderate right ICA disease 40-59%.  The patient is here for presurgical clearance before having surgery on the left kidney mass by Dr. Laverle Patter. She denies any chest pain, palpitations, dyspnea, dyspnea on exertion, dizziness, or presyncope. She has trouble walking because of her legs but she does walk a mile a day. She is able to do some of her housework without any symptoms. She is a former smoker. Overall she has done well.  No Known Allergies  Current Outpatient Prescriptions on File Prior to Visit: albuterol (PROVENTIL HFA;VENTOLIN HFA) 108 (90 BASE) MCG/ACT inhaler, Inhale 2 puffs into the lungs every 6 (six) hours as needed for wheezing., Disp: 1 Inhaler, Rfl: 2 ARIPiprazole (ABILIFY) 15 MG tablet, Take 15 mg by mouth daily. , Disp: , Rfl:  aspirin EC 81 MG tablet, Take 81 mg by mouth daily., Disp: , Rfl:  calcium-vitamin D (OSCAL WITH D) 500-200 MG-UNIT per tablet, Take 1 tablet by mouth daily. , Disp: , Rfl:  Cholecalciferol (VITAMIN D3) 2000 UNITS TABS, Take 1 capsule by mouth daily. , Disp: , Rfl:  clonazePAM (KLONOPIN) 1 MG tablet, Take 1 mg by mouth 3 (three) times daily as needed for anxiety. , Disp: , Rfl:  cloNIDine (CATAPRES) 0.2 MG tablet, Take 0.2 mg by mouth 2 (two) times daily. , Disp: , Rfl:  conjugated estrogens (PREMARIN) vaginal cream, Place 0.25 Applicatorfuls vaginally daily., Disp: 42.5 g, Rfl: 2 esomeprazole (NEXIUM) 20 MG capsule, Take 20 mg by mouth daily before breakfast., Disp: , Rfl:  HYDROcodone-acetaminophen (NORCO/VICODIN) 5-325 MG per tablet, Take 1-2 tablets by mouth every 4 (four) hours as needed for pain., Disp: 15 tablet, Rfl: 0 hydrocortisone cream 1 %, Apply topically 2 (two)  times daily., Disp: 30 g, Rfl: 0 lamoTRIgine (LAMICTAL) 100 MG tablet, Take 200 mg by mouth 2 (two) times daily. , Disp: , Rfl:  mometasone-formoterol (DULERA) 100-5 MCG/ACT AERO, Inhale 2 puffs into the lungs 2 (two) times daily., Disp: 1 Inhaler, Rfl: 3 Multiple Vitamin (MULTIVITAMIN WITH MINERALS) TABS, Take 1 tablet by mouth daily., Disp: , Rfl:  nitrofurantoin, macrocrystal-monohydrate, (MACROBID) 100 MG capsule, Take 1 capsule (100 mg total) by mouth 2 (two) times daily. X 7 days, Disp: 14 capsule, Rfl: 0 olmesartan-hydrochlorothiazide (BENICAR HCT) 40-25 MG per tablet, Take 1 tablet by mouth daily., Disp: , Rfl:  Rosuvastatin Calcium (CRESTOR PO), Take by mouth at bedtime., Disp: , Rfl:  sertraline (ZOLOFT) 50 MG tablet, Take 50 mg by mouth daily. , Disp: , Rfl:   tiotropium (SPIRIVA) 18 MCG inhalation capsule, Place 1 capsule (18 mcg total) into inhaler and inhale daily., Disp: 30 capsule, Rfl: 3  No current facility-administered medications on file prior to visit.   Past Medical History:   Hypertension                                                 Depression  Alcoholism                                                     Comment:recovering since 2000   PONV (postoperative nausea and vomiting)                     Other and unspecified general anesthetics caus* post op d*   Coronary artery disease                         CARDIOLOG*   Inferior MI                                     2000--  P*   Bipolar disorder                                             Peripheral vascular disease                     POST RIGH*   Status post carotid endarterectomy              RIGHT -- *   Status post primary angioplasty with coronary * 2000--  P*   Blood transfusion                                            Normal cardiac stress test                      2008-- PE*   Arthritis                                       BACK         Chronic back pain                                             Osteoporosis                                                 Rosacea                                         LEFT FACI*   Idiopathic acute facial nerve palsy             LEFT SIDE*   Blepharospasm                                   LEFT EYE     History  of alcohol abuse                        RECOVERIN*   Anxiety                                                      Unstable balance                                WALKS W/ *   HTN (hypertension)                                           Vaginal cancer                                               Cervical cancer                                 12/28/11       Comment: vagina upper right bx==squamous cell ca   Emphysema                                                    S/P radiation therapy                                          Comment:HDR 5 Fractions to Upper Vaginal Area   Left renal mass                                 03/02/12     Past Surgical History:   TOTAL KNEE ARTHROPLASTY                          09-22-2009     Comment:RIGHT   VAGINAL HYSTERECTOMY                             07-06-2009   HEMIARTHROPLASTY HIP                             12-26-2008     Comment:LEFT FEMORAL NECK FX   TOTAL HIP ARTHROPLASTY                           04-15-2008     Comment:POST FAILED  RIGHT HIP ORIF FEMORAL FX   CERVICAL CONIZATION W/BX                         09-23-2008   ORIF HIP FRACTURE  02-13-2007     Comment:RIGHT FEMORAL NECK FX   RIGHT SHOULDER SURG.                             2007         CORONARY ANGIOPLASTY WITH STENT PLACEMENT        2000-   I*     Comment:X1 STENT TO RCA   CAROTID ENDARTERECTOMY                           1995           Comment:RIGHT   CATARACT EXTRACTION W/ INTRAOCULAR LENS  IMPLA*               ORIF RIGHT DISTAL RADIUS AND RIGHT PROXIMAL HU*  10-10-2005   UPPER RIGHT VAGINAL REGION                       12/28/11       Comment:BIOPSY:  SQUAMOUS CELL CARCINOMA   EUS                                             N/A 03/05/2012      Comment:Procedure: FULL UPPER ENDOSCOPIC ULTRASOUND               (EUS) RADIAL and EGD;  Surgeon: Rachael Fee, MD;  Location: [facility];  Service:               Endoscopy;  Laterality: N/A;  ercp scope first               than eus scope  Review of patient's family history indicates:   Hypertension                   Mother                   Hypertension                   Father                   Social History   Marital Status: Divorced            Spouse Name:                      Years of Education:                 Number of children:             Occupational History   None on file  Social History Main Topics   Smoking Status: Former Smoker                   Packs/Day: 2.00  Years: 50        Types: Cigarettes     Quit date: 01/18/2011   Smokeless Status: Never Used                       Comment: STATES QUIT SMOKING 01-18-2011   Alcohol Use: No  Comment: RECOVERING ALCOHOLIC--   QUIT IN 2000   Drug Use: No             Sexual Activity: No                 Other Topics            Concern   None on file  Social History Narrative   None on file    ROS: See HPI Eyes: glasses Ears:Negative for hearing loss, tinnitus Cardiovascular: Negative for chest pain, palpitations,irregular heartbeat, dyspnea, dyspnea on exertion, near-syncope, orthopnea, paroxysmal nocturnal dyspnea and syncope,edema, claudication, cyanosis,.  Respiratory:   Negative for cough, hemoptysis, shortness of breath, sleep disturbances due to breathing, sputum production and wheezing.   Endocrine: Negative for cold intolerance and heat intolerance.  Hematologic/Lymphatic: Negative for adenopathy and bleeding problem. Does not bruise/bleed easily.  Musculoskeletal: leg problems.   Gastrointestinal: Negative for nausea, vomiting, reflux, abdominal pain, diarrhea, constipation.    Genitourinary:Tomorrow in her left kidney need surgery.  Neurological: Negative.  Allergic/Immunologic: Negative for environmental allergies.    PHYSICAL EXAM: Well-nournished, in no acute distress. Neck: right carotid bruit,No JVD, HJR,  or thyroid enlargement  Lungs: No tachypnea, clear without wheezing, rales, or rhonchi  Cardiovascular: RRR, PMI not displaced, there were 6 systolic murmur at the apex, 1/6 diastolic murmur at the left sternal border, no gallops, bruit, thrill, or heave.  Abdomen: BS normal. Soft without organomegaly, masses, lesions or tenderness.  Extremities: without cyanosis, clubbing or edema. Good distal pulses bilateral  SKin: Warm, no lesions or rashes   Musculoskeletal: No deformities  Neuro: no focal signs  BP 138/88  Pulse 60  Ht 5' 5.5" (1.664 m)  Wt 158 lb 12.8 oz (72.031 kg)  BMI 26.01 kg/m2  SpO2 95%    ZOX:WRUEA bradycardia otherwise normal   Carotid dopplers 3/14: No change in  Moderate right ICA disease. 40-59%. Repeat one year.  Lexiscan 02/28/11: Overall Impression:  Normal stress nuclear study with mild apical thinning but no ischemia.

## 2012-06-15 ENCOUNTER — Other Ambulatory Visit: Payer: Self-pay | Admitting: Urology

## 2012-06-20 DIAGNOSIS — M545 Low back pain: Secondary | ICD-10-CM | POA: Diagnosis not present

## 2012-06-21 ENCOUNTER — Telehealth: Payer: Self-pay | Admitting: Cardiology

## 2012-06-21 NOTE — Telephone Encounter (Signed)
New problem   Per pt both ankles are swollen and she wants to know if she can be seen

## 2012-06-21 NOTE — Telephone Encounter (Signed)
Both feet swelling, does not go down at night. Advised patient Dr Swaziland not in the office to contact PCP and if heart related they will call for appointment, verbalized understanding.

## 2012-06-22 ENCOUNTER — Ambulatory Visit (HOSPITAL_COMMUNITY): Payer: Medicare Other | Attending: Cardiology | Admitting: Radiology

## 2012-06-22 ENCOUNTER — Telehealth: Payer: Self-pay | Admitting: *Deleted

## 2012-06-22 DIAGNOSIS — E785 Hyperlipidemia, unspecified: Secondary | ICD-10-CM | POA: Insufficient documentation

## 2012-06-22 DIAGNOSIS — I10 Essential (primary) hypertension: Secondary | ICD-10-CM | POA: Diagnosis not present

## 2012-06-22 DIAGNOSIS — R011 Cardiac murmur, unspecified: Secondary | ICD-10-CM | POA: Diagnosis not present

## 2012-06-22 DIAGNOSIS — Z01810 Encounter for preprocedural cardiovascular examination: Secondary | ICD-10-CM | POA: Insufficient documentation

## 2012-06-22 DIAGNOSIS — Z87891 Personal history of nicotine dependence: Secondary | ICD-10-CM | POA: Insufficient documentation

## 2012-06-22 NOTE — Telephone Encounter (Signed)
Patient came in today for an echo.  She was leaving and asked to speak with me and wanted for me to look at legs. Spoke with her yesterday regarding swelling in her legs and advised to contact PCP. Patient presents with bilateral lower extremity swelling. She denies any increased shortness of breath. Legs are not warm to touch or discolored. Patient does not drink water just sodas, has support hose but does not wear them. Advised patient to wear her support stockings, avoid sodas, drink water, and keep legs elevated above heart as much as possible. If legs become hot to touch or discolored needed to be evaluated.  If no improvement to call back or call PCP.

## 2012-06-22 NOTE — Progress Notes (Signed)
Echocardiogram performed.  

## 2012-06-25 DIAGNOSIS — I10 Essential (primary) hypertension: Secondary | ICD-10-CM | POA: Diagnosis not present

## 2012-06-25 DIAGNOSIS — R609 Edema, unspecified: Secondary | ICD-10-CM | POA: Diagnosis not present

## 2012-06-26 ENCOUNTER — Encounter (HOSPITAL_COMMUNITY): Payer: Self-pay | Admitting: Pharmacy Technician

## 2012-06-27 DIAGNOSIS — M545 Low back pain: Secondary | ICD-10-CM | POA: Diagnosis not present

## 2012-06-28 ENCOUNTER — Other Ambulatory Visit: Payer: Self-pay | Admitting: Gynecologic Oncology

## 2012-06-28 DIAGNOSIS — C519 Malignant neoplasm of vulva, unspecified: Secondary | ICD-10-CM

## 2012-06-28 MED ORDER — ESTROGENS, CONJUGATED 0.625 MG/GM VA CREA: 0.5000 g | TOPICAL_CREAM | Freq: Every day | VAGINAL | Status: DC

## 2012-06-29 NOTE — Progress Notes (Signed)
Chest x ray 5/14, EKG 4/14, eccho 6/14, OV cardiology Leda Gauze 06/13/12 [facility]

## 2012-06-29 NOTE — Patient Instructions (Addendum)
20 DANELIA SNODGRASS  06/29/2012   Your procedure is scheduled on:  07/09/12  MONDAY  Report to [facility] at  0900     AM.  Call this number if you have problems the morning of surgery: [phone]       Remember  Do not eat food   After MIDNIGHT Saturday NIGHT-   BEGIN CLEAR LIQUIDS Sunday AS DIRECTED BY OFFICE FOR BOWEL PREP  /  NOTHING AFTER MIDNIGHT Sunday NIGHT   Take these medicines the morning of surgery with A SIP OF WATER: Nexium, Abilify, Clonodine, Lamictal, Zoloft      USE DULERA MAY TAKE  CLONAZEPAM, Hydrocodone if needed or use ALBUTEROL BRING INHALERS WITH YOU TO HOSPITAL .  Contacts, dentures or partial plates can not be worn to surgery  Leave suitcase in the car. After surgery it may be brought to your room.  For patients admitted to the hospital, checkout time is 11:00 AM day of  discharge.             SPECIAL INSTRUCTIONS- SEE [place] PREPARING FOR SURGERY INSTRUCTION SHEET-     DO NOT WEAR JEWELRY, LOTIONS, POWDERS, OR PERFUMES.  WOMEN-- DO NOT SHAVE LEGS OR UNDERARMS FOR 12 HOURS BEFORE SHOWERS. MEN MAY SHAVE FACE.  Patients discharged the day of surgery will not be allowed to drive home. IF going home the day of surgery, you must have a driver and someone to stay with you for the first 24 hours  Name and phone number of your driver:      ADMISSION                                                                  Please read over the following fact sheets that you were given: MRSA Information, Incentive Spirometry Sheet, Blood Transfusion Sheet  Information                                                                                   Janita Camberos  PST 336  [phone]                 FAILURE TO FOLLOW THESE INSTRUCTIONS MAY RESULT IN  CANCELLATION   OF YOUR SURGERY                                                  Patient Signature _____________________________

## 2012-07-02 ENCOUNTER — Telehealth: Payer: Self-pay | Admitting: Cardiology

## 2012-07-02 ENCOUNTER — Encounter (HOSPITAL_COMMUNITY): Payer: Self-pay

## 2012-07-02 ENCOUNTER — Encounter (HOSPITAL_COMMUNITY)
Admission: RE | Admit: 2012-07-02 | Discharge: 2012-07-02 | Disposition: A | Discharge status: Home / Self Care | Payer: Medicare Other | Source: Ambulatory Visit | Attending: Urology | Admitting: Urology

## 2012-07-02 HISTORY — DX: Unspecified convulsions: R56.9

## 2012-07-02 LAB — BASIC METABOLIC PANEL
BUN: 11 mg/dL (ref 6–23)
CO2: 28 mEq/L (ref 19–32)
Calcium: 10 mg/dL (ref 8.4–10.5)
Creatinine, Ser: 0.68 mg/dL (ref 0.50–1.10)
Glucose, Bld: 128 mg/dL — ABNORMAL HIGH (ref 70–99)

## 2012-07-02 LAB — CBC
MCH: 30.3 pg (ref 26.0–34.0)
MCHC: 34.7 g/dL (ref 30.0–36.0)
MCV: 87.3 fL (ref 78.0–100.0)
Platelets: 323 10*3/uL (ref 150–400)
RDW: 13.9 % (ref 11.5–15.5)

## 2012-07-02 NOTE — Telephone Encounter (Signed)
New problem   Selita/[facility] is checking on a surgical clearance she faxed over on 05/3012 and pt is for sx on 07/09/12

## 2012-07-02 NOTE — Telephone Encounter (Signed)
Returned call to [place] at [facility] no answer.LMTC.

## 2012-07-02 NOTE — Progress Notes (Signed)
Faxed BMET to Dr Borden through [facility] 

## 2012-07-03 NOTE — Telephone Encounter (Signed)
Spoke to [name] at [facility] patient needs cardiac clearance.Spoke to Dr.Jordan he has cleared patient for surgery.Note faxed to [facility] at fax # [phone].

## 2012-07-06 DIAGNOSIS — Z79899 Other long term (current) drug therapy: Secondary | ICD-10-CM | POA: Diagnosis not present

## 2012-07-06 DIAGNOSIS — M545 Low back pain: Secondary | ICD-10-CM | POA: Diagnosis not present

## 2012-07-06 DIAGNOSIS — M5137 Other intervertebral disc degeneration, lumbosacral region: Secondary | ICD-10-CM | POA: Diagnosis not present

## 2012-07-06 DIAGNOSIS — G894 Chronic pain syndrome: Secondary | ICD-10-CM | POA: Diagnosis not present

## 2012-07-06 NOTE — H&P (Signed)
Chief Complaint  Left renal neoplasm   Reason For Visit  Reason for consult: To discuss treatment for her left renal mass and specifically to consider a minimally invasive left partial nephrectomy. Physician requesting consult: Dr. Jerilee Field PCP: Dr. 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 She was treated with high dose radiation in February 2014 under the care of Dr. Lonie Peak for this recurrence. She is followed by Dr. Cleda Mccreedy and had a recent PAP smear and is currently awaiting thos results.  She has been incidentally noted to have a 4.4 cm left upper pole renal cystic mass noted on CT imaging and further characterized on an MRI on 03/03/12 which demostrated a 4.4 x 4.1 cm mostly exophytic left upper pole partially cystic mass with enhancing solid components.  This mass was confirmed to appear solid on ultrasound. She had bilateral simple renal cysts as well and a circumaortic left renal vein. Her baseline renal function is normal with a baseline Cr of 0.70. A CXR on 03/02/12 demonstrated evidence of COPD but no metastases.  She also has a history of coronary artery disease status post cardiac stent placement in 2000. She is followed by Dr. Peter [place] who she has not seen in over one year. She does take aspirin 81 mg. She was incidentally determined to have COPD on imaging studies from her hospitalization in January. She is currently on therapy including areola, do i, and albuterol. She has not had any recent chest pain, shortness of breath, difficulty breathing or wheezing, or palpitations. Finally, she also has known carotid artery stenosis and underwent a recent Doppler ultrasound which demonstrated stability and moderate  stenosis of her right carotid artery. 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 She receives Botox injections periodically for management but assures me that she has not had a history of a stroke.  She otherwise denies any abdominal pain, hematuria, or family history of kidney cancer or end-stage renal disease.   Past Medical History Problems  1. History of  Acute Myocardial Infarction V12.59 2. History of  Anxiety (Symptom) 300.00 3. History of  Arthritis V13.4 4. History of  Cancer 199.1 5. History of  Depression 311 6. History of  Esophageal Reflux 530.81 7. History of  Heart Disease 429.9 8. History of  Hyperlipidemia 272.4 9. History of  Hypertension 401.9  Surgical History Problems  1. History of  Carotid Artery Exploration 2. History of  Cath Stent Placement 3. History of  Knee Replacement 4. History of  Shoulder Surgery Right 5. History of  Total Hip Replacement 6. History of  Total Hip Replacement 7. History of  Wrist Incision  Current Meds 1. Abilify TABS; Therapy: (Recorded:06Mar2014) to 2. Albuterol Sulfate 1.25 MG/3ML Inhalation Nebulization Solution; Therapy: (Recorded:06Mar2014)  to 3. Aspirin 81 MG Oral Tablet; Therapy: (Recorded:06Mar2014) to 4. Benicar HCT 40-25 MG Oral Tablet; Therapy: (Recorded:06Mar2014) to 5. ClonazePAM 1 MG Oral Tablet; Therapy: (Recorded:06Mar2014) to 6. CloNIDine HCl 0.2 MG Oral Tablet; Therapy: (Recorded:06Mar2014) to 7. Dulera 100-5 MCG/ACT Inhalation Aerosol; Therapy: (Recorded:06Mar2014) to 8. LaMICtal TABS; Therapy: (Recorded:06Mar2014) to 9. Multi-Vitamin TABS; Therapy: (Recorded:06Mar2014) to 10. NexIUM 20 MG Oral Capsule Delayed Release; Therapy: (Recorded:06Mar2014)  to 11. Oscal 500/200 D-3 TABS; Therapy: (Recorded:06Mar2014) to 12. Simvastatin 10 MG Oral Tablet; Therapy: (Recorded:06Mar2014) to 13. Spiriva HandiHaler 18 MCG Inhalation Capsule; Therapy: (Recorded:06Mar2014)  to 14. Vitamin D3 CAPS; Therapy: (Recorded:06Mar2014) to  Allergies Medication  1. No Known Drug Allergies  Family History Problems  1. Family history of  Death In The Family Father Died age 33-Pneumonia 2. Family history of  Death In The Family Mother Died age 51-Blood clots 3. Paternal uncle's history of  Prostate Cancer V16.42  Social History Problems    Caffeine Use 2 per day   Former Smoker V15.82 Smoked 2 ppd for 40 yrs and quit 2013   Marital History - Single   Occupation: Retired Denied    History of  Alcohol Use  Review of Systems Constitutional, skin, eye, otolaryngeal, hematologic/lymphatic, cardiovascular, pulmonary, endocrine, musculoskeletal, gastrointestinal, neurological and psychiatric system(s) were reviewed and pertinent findings if present are noted.    Vitals Vital Signs [Data Includes: Last 1 Day]  27May2014 10:58AM  BMI Calculated: 25.07 BSA Calculated: 1.8 Height: 5 ft 6 in Weight: 156 lb  Blood Pressure: 195 / 100 Temperature: 98.5 F Heart Rate: 92  Physical Exam Constitutional: Well nourished and well developed . No acute distress.  ENT:. The ears and nose are normal in appearance. She does have facial drooping on the left side of her face.  Neck: The appearance of the neck is normal and no neck mass is present.  Pulmonary: No respiratory distress, normal respiratory rhythm and effort and clear bilateral breath sounds.  Cardiovascular: Heart rate and rhythm are normal . No peripheral edema.  Abdomen: The abdomen is soft and nontender. No masses are palpated. No CVA tenderness. No hernias are palpable. No hepatosplenomegaly noted.  Lymphatics: The supraclavicular, femoral and inguinal nodes are not enlarged or tender.  Skin: Normal skin turgor, no visible rash and no visible skin lesions.  Neuro/Psych:. Mood and affect are appropriate.    Results/Data Urine [Data Includes: Last 1 Day]   27May2014  COLOR YELLOW   APPEARANCE CLEAR    SPECIFIC GRAVITY 1.010   pH 6.0   GLUCOSE NEG mg/dL  BILIRUBIN NEG   KETONE NEG mg/dL  BLOOD NEG   PROTEIN NEG mg/dL  UROBILINOGEN 0.2 mg/dL  NITRITE NEG   LEUKOCYTE ESTERASE SMALL   SQUAMOUS EPITHELIAL/HPF RARE   WBC 7-10 WBC/hpf  RBC NONE SEEN RBC/hpf  BACTERIA MODERATE   CRYSTALS NONE SEEN   CASTS NONE SEEN     Her urine will be cultured.  I have independently reviewed her CT scan of the abdomen, MRI of the abdomen, and renal ultrasound with findings as dictated above. I have also reviewed her medical records.   Assessment Assessed  1. Renal Neoplasm 239.5  Plan Health Maintenance (V70.0)  1. UA With REFLEX  Done: 27May2014 10:47AM Pyuria (791.9)  2. URINE CULTURE  Done: 27May2014 Renal Neoplasm (239.5)  3. CBC w/PLT NO DIFF  Done: 27May2014 4. CHEST X-RAY  Requested for: 27May2014 5. COMPREHENSIVE METABOLIC PANEL  Done: 27May2014 6. VENIPUNCTURE  Done: 27May2014 7. Follow-up Office  Follow-up  Requested for: 27May2014  Discussion/Summary  1. Left renal neoplasm concerning for renal malignancy: Ms. [name] was seen with her friend today and we had a very detailed discussion about her complex situation.  She has multiple comorbid issues that place her at high risk for surgical resection of her renal mass. She does have recently treated squamous cell carcinoma of the vaginal cuff that has required treatment and I  will need to talk with Dr. Duard Brady  about her current disease status and prognosis. She also has a history significant for coronary artery disease and will need to see Dr. [place] since she is overdue for followup to ensure she would be at low risk from a cardiac standpoint. Finally, she has other medical conditions including a history of COPD that we'll place her at a higher risk for surgical complications postoperatively. In addition, she does take aspirin 81 mg which she needs to continue perioperatively based on her history of a cardiac stent. This will increase  her risk of bleeding with a partial nephrectomy although this risk would seem to be lower than her risk of a coronary artery thrombosis if her antiplatelet therapy is stopped.   The patient was provided information regarding their renal mass including the relative risk of benign versus malignant pathology and the natural history of renal cell carcinoma and other possible malignancies of the kidney. The role of renal biopsy, laboratory testing, and imaging studies to further characterize renal masses and/or the presence of metastatic disease were explained. We discussed the role of active surveillance, surgical therapy with both radical nephrectomy and nephron-sparing surgery, and ablative therapy in the treatment of renal masses. In addition, we discussed our goals of providing an accurate diagnosis and oncologic control while maintaining optimal renal function as appropriate based on the size, location, and complexity of their renal mass as well as their co-morbidities.    We have discussed the risks of treatment in detail including but not limited to bleeding, infection, heart attack, stroke, death, venothromoboembolism, cancer recurrence, injury/damage to surrounding organs and structures, urine leak, the possibility of open surgical conversion for patients undergoing minimally invasive surgery, the risk of developing chronic kidney disease and its associated implications, and the potential risk of end stage renal disease possibly necessitating dialysis.   We have thoroughly reviewed options including observation versus surgical resection versus ablation. Unfortunately, her lesion is somewhat large for ablation and observation will likely place her at a high risk for developing metastatic disease over her lifetime assuming she does not have persistent or recurrent squamous cell carcinoma. After reviewing all available options, she is most interested in proceeding with a left partial nephrectomy. I do think  that this technically can be performed with a robotic-assisted laparoscopic technique.  My plan is as follows:  1. She will complete a metastatic evaluation including chest imaging and laboratory studies today. 2. I will talk with Dr. Duard Brady to get more information about her current disease status and prognosis with regard to her squamous cell carcinoma of the vaginal cuff. 3. She will be scheduled to see Dr. [place] for her routine followup and for preoperative cardiac risk assessment. 4. She will tentatively be scheduled for a left robotic assisted laparoscopic partial nephrectomy. I have instructed her to continue aspirin 81 mg perioperatively and she does understand the potential for increased bleeding risk that the necessity of continuing her antiplatelet therapy considering her history of a cardiac stent.   CC: Dr. Jerilee Field Dr. Aram Beecham white Dr. Peter [place] Dr. Lonie Peak Dr. Cleda Mccreedy

## 2012-07-09 ENCOUNTER — Encounter (HOSPITAL_COMMUNITY): Payer: Self-pay | Admitting: *Deleted

## 2012-07-09 ENCOUNTER — Encounter (HOSPITAL_COMMUNITY)
Admission: RE | Disposition: A | Discharge status: Home / Self Care | Payer: Self-pay | Source: Ambulatory Visit | Attending: Urology

## 2012-07-09 ENCOUNTER — Inpatient Hospital Stay (HOSPITAL_COMMUNITY)
Admission: RE | Admit: 2012-07-09 | Discharge: 2012-07-12 | DRG: 658 | Disposition: A | Discharge status: Home / Self Care | Payer: Medicare Other | Source: Ambulatory Visit | Attending: Urology | Admitting: Urology

## 2012-07-09 ENCOUNTER — Inpatient Hospital Stay (HOSPITAL_COMMUNITY): Payer: Medicare Other | Admitting: Anesthesiology

## 2012-07-09 ENCOUNTER — Encounter (HOSPITAL_COMMUNITY): Payer: Self-pay | Admitting: Anesthesiology

## 2012-07-09 DIAGNOSIS — J4489 Other specified chronic obstructive pulmonary disease: Secondary | ICD-10-CM | POA: Diagnosis present

## 2012-07-09 DIAGNOSIS — C649 Malignant neoplasm of unspecified kidney, except renal pelvis: Secondary | ICD-10-CM | POA: Diagnosis not present

## 2012-07-09 DIAGNOSIS — F1010 Alcohol abuse, uncomplicated: Secondary | ICD-10-CM | POA: Diagnosis present

## 2012-07-09 DIAGNOSIS — R339 Retention of urine, unspecified: Secondary | ICD-10-CM | POA: Diagnosis not present

## 2012-07-09 DIAGNOSIS — J449 Chronic obstructive pulmonary disease, unspecified: Secondary | ICD-10-CM | POA: Diagnosis present

## 2012-07-09 DIAGNOSIS — R29810 Facial weakness: Secondary | ICD-10-CM | POA: Diagnosis present

## 2012-07-09 DIAGNOSIS — Z9861 Coronary angioplasty status: Secondary | ICD-10-CM

## 2012-07-09 DIAGNOSIS — Z87891 Personal history of nicotine dependence: Secondary | ICD-10-CM

## 2012-07-09 DIAGNOSIS — R34 Anuria and oliguria: Secondary | ICD-10-CM | POA: Diagnosis not present

## 2012-07-09 DIAGNOSIS — I252 Old myocardial infarction: Secondary | ICD-10-CM

## 2012-07-09 DIAGNOSIS — I10 Essential (primary) hypertension: Secondary | ICD-10-CM | POA: Diagnosis not present

## 2012-07-09 DIAGNOSIS — I2510 Atherosclerotic heart disease of native coronary artery without angina pectoris: Secondary | ICD-10-CM | POA: Diagnosis present

## 2012-07-09 DIAGNOSIS — Z96649 Presence of unspecified artificial hip joint: Secondary | ICD-10-CM

## 2012-07-09 DIAGNOSIS — Z01812 Encounter for preprocedural laboratory examination: Secondary | ICD-10-CM

## 2012-07-09 DIAGNOSIS — K219 Gastro-esophageal reflux disease without esophagitis: Secondary | ICD-10-CM | POA: Diagnosis present

## 2012-07-09 DIAGNOSIS — Z96659 Presence of unspecified artificial knee joint: Secondary | ICD-10-CM

## 2012-07-09 DIAGNOSIS — D4959 Neoplasm of unspecified behavior of other genitourinary organ: Secondary | ICD-10-CM | POA: Diagnosis not present

## 2012-07-09 DIAGNOSIS — Z8541 Personal history of malignant neoplasm of cervix uteri: Secondary | ICD-10-CM

## 2012-07-09 HISTORY — DX: Malignant neoplasm of unspecified kidney, except renal pelvis: C64.9

## 2012-07-09 HISTORY — PX: ROBOTIC ASSITED PARTIAL NEPHRECTOMY: SHX6087

## 2012-07-09 LAB — BASIC METABOLIC PANEL
CO2: 28 mEq/L (ref 19–32)
Chloride: 91 mEq/L — ABNORMAL LOW (ref 96–112)
Creatinine, Ser: 0.65 mg/dL (ref 0.50–1.10)
Glucose, Bld: 110 mg/dL — ABNORMAL HIGH (ref 70–99)

## 2012-07-09 LAB — HEMOGLOBIN AND HEMATOCRIT, BLOOD
HCT: 37 % (ref 36.0–46.0)
Hemoglobin: 13 g/dL (ref 12.0–15.0)

## 2012-07-09 SURGERY — ROBOTIC ASSITED PARTIAL NEPHRECTOMY
Anesthesia: General | Laterality: Left | Wound class: Clean

## 2012-07-09 MED ORDER — PROPOFOL 10 MG/ML IV BOLUS
INTRAVENOUS | Status: DC | PRN
  Administered 2012-07-09: 120 mg via INTRAVENOUS

## 2012-07-09 MED ORDER — FUROSEMIDE 20 MG PO TABS
20.0000 mg | ORAL_TABLET | Freq: Every morning | ORAL | Status: DC
  Administered 2012-07-10 – 2012-07-11 (×2): 20 mg via ORAL
  Filled 2012-07-09 (×3): qty 1

## 2012-07-09 MED ORDER — LIDOCAINE HCL (CARDIAC) 20 MG/ML IV SOLN
INTRAVENOUS | Status: DC | PRN
  Administered 2012-07-09: 80 mg via INTRAVENOUS

## 2012-07-09 MED ORDER — CLONIDINE HCL 0.2 MG PO TABS
0.2000 mg | ORAL_TABLET | Freq: Two times a day (BID) | ORAL | Status: DC
  Administered 2012-07-10 – 2012-07-11 (×5): 0.2 mg via ORAL
  Filled 2012-07-09 (×7): qty 1

## 2012-07-09 MED ORDER — GLYCOPYRROLATE 0.2 MG/ML IJ SOLN
INTRAMUSCULAR | Status: DC | PRN
  Administered 2012-07-09: 0.6 mg via INTRAVENOUS

## 2012-07-09 MED ORDER — MANNITOL 25 % IV SOLN
25.0000 g | Freq: Once | INTRAVENOUS | Status: DC
  Filled 2012-07-09: qty 100

## 2012-07-09 MED ORDER — ASPIRIN EC 81 MG PO TBEC
81.0000 mg | DELAYED_RELEASE_TABLET | Freq: Every morning | ORAL | Status: DC
Start: 2012-07-10 — End: 2012-07-12
  Administered 2012-07-10 – 2012-07-11 (×2): 81 mg via ORAL
  Filled 2012-07-09 (×3): qty 1

## 2012-07-09 MED ORDER — DOCUSATE SODIUM 100 MG PO CAPS
100.0000 mg | ORAL_CAPSULE | Freq: Two times a day (BID) | ORAL | Status: DC
  Administered 2012-07-10 – 2012-07-11 (×5): 100 mg via ORAL
  Filled 2012-07-09 (×7): qty 1

## 2012-07-09 MED ORDER — SERTRALINE HCL 50 MG PO TABS
50.0000 mg | ORAL_TABLET | Freq: Every morning | ORAL | Status: DC
  Administered 2012-07-10 – 2012-07-11 (×2): 50 mg via ORAL
  Filled 2012-07-09 (×3): qty 1

## 2012-07-09 MED ORDER — TIOTROPIUM BROMIDE MONOHYDRATE 18 MCG IN CAPS
18.0000 ug | ORAL_CAPSULE | Freq: Every day | RESPIRATORY_TRACT | Status: DC
  Administered 2012-07-10 – 2012-07-11 (×2): 18 ug via RESPIRATORY_TRACT
  Filled 2012-07-09: qty 5

## 2012-07-09 MED ORDER — LABETALOL HCL 5 MG/ML IV SOLN
INTRAVENOUS | Status: DC | PRN
  Administered 2012-07-09: 5 mg via INTRAVENOUS

## 2012-07-09 MED ORDER — ONDANSETRON HCL 4 MG/2ML IJ SOLN
INTRAMUSCULAR | Status: DC | PRN
  Administered 2012-07-09: 4 mg via INTRAVENOUS

## 2012-07-09 MED ORDER — ALBUTEROL SULFATE (5 MG/ML) 0.5% IN NEBU
2.5000 mg | INHALATION_SOLUTION | Freq: Once | RESPIRATORY_TRACT | Status: AC
  Administered 2012-07-09: 2.5 mg via RESPIRATORY_TRACT

## 2012-07-09 MED ORDER — ATORVASTATIN CALCIUM 10 MG PO TABS
10.0000 mg | ORAL_TABLET | Freq: Every day | ORAL | Status: DC
  Administered 2012-07-09 – 2012-07-11 (×3): 10 mg via ORAL
  Filled 2012-07-09 (×4): qty 1

## 2012-07-09 MED ORDER — CEFAZOLIN SODIUM-DEXTROSE 2-3 GM-% IV SOLR
2.0000 g | INTRAVENOUS | Status: AC
  Administered 2012-07-09: 2 g via INTRAVENOUS

## 2012-07-09 MED ORDER — ZOLPIDEM TARTRATE 5 MG PO TABS: 5.0000 mg | ORAL_TABLET | Freq: Every evening | ORAL | Status: DC | PRN

## 2012-07-09 MED ORDER — DEXTROSE-NACL 5-0.9 % IV SOLN
INTRAVENOUS | Status: DC
  Administered 2012-07-09 – 2012-07-11 (×5): via INTRAVENOUS

## 2012-07-09 MED ORDER — DEXTROSE-NACL 5-0.45 % IV SOLN
INTRAVENOUS | Status: DC
  Administered 2012-07-09: 18:00:00 via INTRAVENOUS

## 2012-07-09 MED ORDER — CEFAZOLIN SODIUM 1-5 GM-% IV SOLN
1.0000 g | Freq: Three times a day (TID) | INTRAVENOUS | Status: AC
  Administered 2012-07-09 – 2012-07-10 (×2): 1 g via INTRAVENOUS
  Filled 2012-07-09 (×3): qty 50

## 2012-07-09 MED ORDER — FENTANYL CITRATE 0.05 MG/ML IJ SOLN
INTRAMUSCULAR | Status: DC | PRN
  Administered 2012-07-09: 50 ug via INTRAVENOUS
  Administered 2012-07-09: 100 ug via INTRAVENOUS
  Administered 2012-07-09 (×2): 50 ug via INTRAVENOUS

## 2012-07-09 MED ORDER — MOMETASONE FURO-FORMOTEROL FUM 100-5 MCG/ACT IN AERO
2.0000 | INHALATION_SPRAY | Freq: Two times a day (BID) | RESPIRATORY_TRACT | Status: DC
  Administered 2012-07-09 – 2012-07-11 (×5): 2 via RESPIRATORY_TRACT
  Filled 2012-07-09: qty 8.8

## 2012-07-09 MED ORDER — SODIUM CHLORIDE 0.9 % IJ SOLN
INTRAMUSCULAR | Status: DC | PRN
  Administered 2012-07-09: 15:00:00

## 2012-07-09 MED ORDER — LAMOTRIGINE 200 MG PO TABS
200.0000 mg | ORAL_TABLET | Freq: Two times a day (BID) | ORAL | Status: DC
  Administered 2012-07-10 – 2012-07-11 (×5): 200 mg via ORAL
  Filled 2012-07-09 (×7): qty 1

## 2012-07-09 MED ORDER — PANTOPRAZOLE SODIUM 40 MG PO TBEC
40.0000 mg | DELAYED_RELEASE_TABLET | Freq: Every day | ORAL | Status: DC
  Administered 2012-07-10 – 2012-07-11 (×2): 40 mg via ORAL
  Filled 2012-07-09 (×3): qty 1

## 2012-07-09 MED ORDER — STERILE WATER FOR IRRIGATION IR SOLN
Status: DC | PRN
  Administered 2012-07-09: 1500 mL

## 2012-07-09 MED ORDER — LACTATED RINGERS IV SOLN
INTRAVENOUS | Status: DC | PRN
  Administered 2012-07-09: 11:00:00 via INTRAVENOUS

## 2012-07-09 MED ORDER — BUPIVACAINE LIPOSOME 1.3 % IJ SUSP
20.0000 mL | Freq: Once | INTRAMUSCULAR | Status: DC
  Filled 2012-07-09: qty 20

## 2012-07-09 MED ORDER — HYDRALAZINE HCL 20 MG/ML IJ SOLN
INTRAMUSCULAR | Status: DC | PRN
  Administered 2012-07-09: 5 mg via INTRAVENOUS

## 2012-07-09 MED ORDER — DIPHENHYDRAMINE HCL 50 MG/ML IJ SOLN: 12.5000 mg | Freq: Four times a day (QID) | INTRAMUSCULAR | Status: DC | PRN

## 2012-07-09 MED ORDER — DIPHENHYDRAMINE HCL 12.5 MG/5ML PO ELIX: 12.5000 mg | ORAL_SOLUTION | Freq: Four times a day (QID) | ORAL | Status: DC | PRN

## 2012-07-09 MED ORDER — MANNITOL 25 % IV SOLN
INTRAVENOUS | Status: DC | PRN
  Administered 2012-07-09 (×2): 12.5 g via INTRAVENOUS

## 2012-07-09 MED ORDER — HYDROMORPHONE HCL PF 1 MG/ML IJ SOLN
0.2500 mg | INTRAMUSCULAR | Status: DC | PRN
  Administered 2012-07-09: 0.5 mg via INTRAVENOUS

## 2012-07-09 MED ORDER — HYDROMORPHONE HCL PF 1 MG/ML IJ SOLN
INTRAMUSCULAR | Status: DC | PRN
  Administered 2012-07-09: 0.5 mg via INTRAVENOUS

## 2012-07-09 MED ORDER — ALBUTEROL SULFATE HFA 108 (90 BASE) MCG/ACT IN AERS
2.0000 | INHALATION_SPRAY | Freq: Four times a day (QID) | RESPIRATORY_TRACT | Status: DC | PRN
  Filled 2012-07-09: qty 6.7

## 2012-07-09 MED ORDER — CLONAZEPAM 1 MG PO TABS
1.0000 mg | ORAL_TABLET | Freq: Three times a day (TID) | ORAL | Status: DC | PRN
  Administered 2012-07-10 – 2012-07-11 (×2): 1 mg via ORAL
  Filled 2012-07-09 (×2): qty 1

## 2012-07-09 MED ORDER — NEOSTIGMINE METHYLSULFATE 1 MG/ML IJ SOLN
INTRAMUSCULAR | Status: DC | PRN
  Administered 2012-07-09: 4 mg via INTRAVENOUS

## 2012-07-09 MED ORDER — LACTATED RINGERS IR SOLN
Status: DC | PRN
  Administered 2012-07-09: 1000 mL

## 2012-07-09 MED ORDER — ACETAMINOPHEN 10 MG/ML IV SOLN
1000.0000 mg | Freq: Four times a day (QID) | INTRAVENOUS | Status: DC
  Administered 2012-07-09 – 2012-07-10 (×3): 1000 mg via INTRAVENOUS
  Filled 2012-07-09 (×5): qty 100

## 2012-07-09 MED ORDER — LACTATED RINGERS IV SOLN
INTRAVENOUS | Status: DC
  Administered 2012-07-09 (×2): via INTRAVENOUS

## 2012-07-09 MED ORDER — ONDANSETRON HCL 4 MG/2ML IJ SOLN: 4.0000 mg | INTRAMUSCULAR | Status: DC | PRN

## 2012-07-09 MED ORDER — MORPHINE SULFATE 2 MG/ML IJ SOLN
2.0000 mg | INTRAMUSCULAR | Status: DC | PRN
  Administered 2012-07-09 – 2012-07-10 (×3): 2 mg via INTRAVENOUS
  Filled 2012-07-09 (×3): qty 1

## 2012-07-09 MED ORDER — ROCURONIUM BROMIDE 100 MG/10ML IV SOLN
INTRAVENOUS | Status: DC | PRN
  Administered 2012-07-09: 5 mg via INTRAVENOUS
  Administered 2012-07-09: 50 mg via INTRAVENOUS
  Administered 2012-07-09: 20 mg via INTRAVENOUS

## 2012-07-09 MED ORDER — PROMETHAZINE HCL 25 MG/ML IJ SOLN: 6.2500 mg | INTRAMUSCULAR | Status: DC | PRN

## 2012-07-09 MED ORDER — ARIPIPRAZOLE 15 MG PO TABS
15.0000 mg | ORAL_TABLET | Freq: Every morning | ORAL | Status: DC
  Administered 2012-07-10 – 2012-07-11 (×2): 15 mg via ORAL
  Filled 2012-07-09 (×3): qty 1

## 2012-07-09 SURGICAL SUPPLY — 64 items
ADH SKN CLS APL DERMABOND .7 (GAUZE/BANDAGES/DRESSINGS) ×1
APL ESCP 34 STRL LF DISP (HEMOSTASIS) ×1
APPLICATOR SURGIFLO ENDO (HEMOSTASIS) ×1 IMPLANT
BAG SPEC RTRVL LRG 6X4 10 (ENDOMECHANICALS) ×1
CHLORAPREP W/TINT 26ML (MISCELLANEOUS) ×2 IMPLANT
CLIP LIGATING HEM O LOK PURPLE (MISCELLANEOUS) ×1 IMPLANT
CLIP LIGATING HEMO O LOK GREEN (MISCELLANEOUS) ×1 IMPLANT
CLOTH BEACON ORANGE TIMEOUT ST (SAFETY) ×2 IMPLANT
CORDS BIPOLAR (ELECTRODE) ×2 IMPLANT
COVER SURGICAL LIGHT HANDLE (MISCELLANEOUS) ×2 IMPLANT
COVER TIP SHEARS 8 DVNC (MISCELLANEOUS) ×1 IMPLANT
COVER TIP SHEARS 8MM DA VINCI (MISCELLANEOUS) ×1
DECANTER SPIKE VIAL GLASS SM (MISCELLANEOUS) ×2 IMPLANT
DERMABOND ADVANCED (GAUZE/BANDAGES/DRESSINGS) ×1
DERMABOND ADVANCED .7 DNX12 (GAUZE/BANDAGES/DRESSINGS) ×2 IMPLANT
DRAIN CHANNEL 15F RND FF 3/16 (WOUND CARE) ×2 IMPLANT
DRAPE INCISE IOBAN 66X45 STRL (DRAPES) ×2 IMPLANT
DRAPE LAPAROSCOPIC ABDOMINAL (DRAPES) ×2 IMPLANT
DRAPE LG THREE QUARTER DISP (DRAPES) ×4 IMPLANT
DRAPE TABLE BACK 44X90 PK DISP (DRAPES) ×2 IMPLANT
DRAPE WARM FLUID 44X44 (DRAPE) ×2 IMPLANT
DRESSING SURGICEL FIBRLLR 1X2 (HEMOSTASIS) ×1 IMPLANT
DRSG SURGICEL FIBRILLAR 1X2 (HEMOSTASIS) ×2
DRSG TEGADERM 4X4.75 (GAUZE/BANDAGES/DRESSINGS) ×1 IMPLANT
ELECT REM PT RETURN 9FT ADLT (ELECTROSURGICAL) ×2
ELECTRODE REM PT RTRN 9FT ADLT (ELECTROSURGICAL) ×1 IMPLANT
EVACUATOR SILICONE 100CC (DRAIN) ×2 IMPLANT
GAUZE VASELINE 3X9 (GAUZE/BANDAGES/DRESSINGS) IMPLANT
GLOVE BIO SURGEON STRL SZ 6.5 (GLOVE) ×2 IMPLANT
GLOVE BIOGEL M STRL SZ7.5 (GLOVE) ×4 IMPLANT
GOWN STRL NON-REIN LRG LVL3 (GOWN DISPOSABLE) ×4 IMPLANT
GOWN STRL REIN XL XLG (GOWN DISPOSABLE) ×2 IMPLANT
HEMOSTAT SURGICEL 4X8 (HEMOSTASIS) IMPLANT
KIT ACCESSORY DA VINCI DISP (KITS) ×1
KIT ACCESSORY DVNC DISP (KITS) ×1 IMPLANT
KIT BASIN OR (CUSTOM PROCEDURE TRAY) ×2 IMPLANT
PENCIL BUTTON HOLSTER BLD 10FT (ELECTRODE) ×2 IMPLANT
POSITIONER SURGICAL ARM (MISCELLANEOUS) ×4 IMPLANT
POUCH SPECIMEN RETRIEVAL 10MM (ENDOMECHANICALS) ×2 IMPLANT
SET TUBE IRRIG SUCTION NO TIP (IRRIGATION / IRRIGATOR) ×1 IMPLANT
SOLUTION ANTI FOG 6CC (MISCELLANEOUS) ×2 IMPLANT
SOLUTION ELECTROLUBE (MISCELLANEOUS) ×2 IMPLANT
SPONGE LAP 18X18 X RAY DECT (DISPOSABLE) ×1 IMPLANT
SURGIFLO W/THROMBIN 8M KIT (HEMOSTASIS) ×2 IMPLANT
SUT ETHILON 3 0 PS 1 (SUTURE) ×2 IMPLANT
SUT MNCRL AB 4-0 PS2 18 (SUTURE) ×4 IMPLANT
SUT V-LOC BARB 180 2/0GR6 GS22 (SUTURE)
SUT V-LOC BARB 180 2/0GR9 GS23 (SUTURE)
SUT VIC AB 0 CT1 27 (SUTURE) ×2
SUT VIC AB 0 CT1 27XBRD ANTBC (SUTURE) ×1 IMPLANT
SUT VIC AB 4-0 RB1 27 (SUTURE) ×2
SUT VIC AB 4-0 RB1 27XBRD (SUTURE) IMPLANT
SUT VICRYL 0 UR6 27IN ABS (SUTURE) ×4 IMPLANT
SUT VLOC BARB 180 ABS3/0GR12 (SUTURE) ×2
SUTURE V-LC BRB 180 2/0GR6GS22 (SUTURE) IMPLANT
SUTURE V-LC BRB 180 2/0GR9GS23 (SUTURE) IMPLANT
SUTURE VLOC BRB 180 ABS3/0GR12 (SUTURE) ×1 IMPLANT
TOWEL OR NON WOVEN STRL DISP B (DISPOSABLE) ×2 IMPLANT
TRAY FOLEY CATH 14FRSI W/METER (CATHETERS) ×2 IMPLANT
TRAY LAP CHOLE (CUSTOM PROCEDURE TRAY) ×2 IMPLANT
TROCAR ENDOPATH XCEL 12X100 BL (ENDOMECHANICALS) ×2 IMPLANT
TROCAR XCEL 12X100 BLDLESS (ENDOMECHANICALS) ×2 IMPLANT
TUBING INSUFFLATION 10FT LAP (TUBING) ×2 IMPLANT
WATER STERILE IRR 1500ML POUR (IV SOLUTION) ×2 IMPLANT

## 2012-07-09 NOTE — Anesthesia Postprocedure Evaluation (Signed)
  Anesthesia Post-op Note  Patient: [name]  Procedure(s) Performed: Procedure(s) (LRB): ROBOTIC ASSITED PARTIAL NEPHRECTOMY (Left)  Patient Location: PACU  Anesthesia Type: General  Level of Consciousness: awake and alert   Airway and Oxygen Therapy: Patient Spontanous Breathing  Post-op Pain: mild  Post-op Assessment: Post-op Vital signs reviewed, Patient's Cardiovascular Status Stable, Respiratory Function Stable, Patent Airway and No signs of Nausea or vomiting  Last Vitals:  Filed Vitals:   07/09/12 1500  BP: 191/91  Pulse: 94  Temp:   Resp: 14    Post-op Vital Signs: stable   Complications: No apparent anesthesia complications

## 2012-07-09 NOTE — Interval H&P Note (Signed)
History and Physical Interval Note:  07/09/2012 11:16 AM  [name]  has presented today for surgery, with the diagnosis of LEFT RENAL NEOPLASM  The various methods of treatment have been discussed with the patient and family. After consideration of risks, benefits and other options for treatment, the patient has consented to  Procedure(s): ROBOTIC ASSITED PARTIAL NEPHRECTOMY (Left) as a surgical intervention .  The patient's history has been reviewed, patient examined, no change in status, stable for surgery.  I have reviewed the patient's chart and labs.  Questions were answered to the patient's satisfaction.     Sheria Rosello,LES

## 2012-07-09 NOTE — Op Note (Signed)
Preoperative diagnosis: Left renal neoplasm  Postoperative diagnosis: Left renal neoplasm  Procedure:  1. Left robotic-assisted laparoscopic partial nephrectomy 2. Intraoperative renal ultrasonography  Surgeon: Moody Bruins. M.D.  Assistant(s): Pecola Leisure, PA-C  Anesthesia: General  Complications: None  EBL: 100 mL  IVF:  1800 mL crystalloid  Specimens: 1. Left renal neoplasm 2. Renal tumor margin  Disposition of specimens: Pathology  Intraoperative findings:       1. Warm renal ischemia time: 17 minutes       2. Intraoperative renal ultrasound findings: There was a 4 cm upper pole renal mass.  This mass appeared solid more superiorly but was cystic and complex with multiple septations inferiorly.  Drains: 1. # 15 Blake perinephric drain  Indication:  [name] is a 67 y.o. year old patient with a left renal neoplasm.  After a thorough review of the management options for their renal mass, they elected to proceed with surgical treatment and the above procedure.  We have discussed the potential benefits and risks of the procedure, side effects of the proposed treatment, the likelihood of the patient achieving the goals of the procedure, and any potential problems that might occur during the procedure or recuperation. Informed consent has been obtained.   Description of procedure:  The patient was taken to the operating room and a general anesthetic was administered. The patient was given preoperative antibiotics, placed in the left modified flank position with care to pad all potential pressure points, and prepped and draped in the usual sterile fashion. Next a preoperative timeout was performed.  A site was selected just below the umbilicus for placement of the camera port. This was placed using a standard open Hassan technique which allowed entry into the peritoneal cavity under direct vision and without difficulty. A 12 mm port was placed and a  pneumoperitoneum established. The camera was then used to inspect the abdomen and there was no evidence of any intra-abdominal injuries or other abnormalities. The remaining abdominal ports were then placed. 8 mm robotic ports were placed in the left upper quadrant, left lower quadrant, and far left lateral abdominal wall. A 12 mm port was placed in the upper midline for laparoscopic assistance. All ports were placed under direct vision without difficulty. The surgical cart was then docked.   Utilizing the cautery scissors, the white line of Toldt was incised allowing the colon to be mobilized medially and the plane between the mesocolon and the anterior layer of Gerota's fascia to be developed and the kidney to be exposed.  The ureter and gonadal vein were identified inferiorly and the ureter was lifted anteriorly off the psoas muscle.  Dissection proceeded superiorly along the gonadal vein until the renal vein was identified.  The renal hilum was then carefully isolated with a combination of blunt and sharp dissection allowing the renal arterial and venous structures to be separated and isolated in preparation for renal hilar vessel clamping. There were three renal arteries including a lower pole artery, a main hilar renal artery, and a very small upper pole artery.  12.5 g of IV mannitol was then administered.   Attention turned to the kidney and the perinephric fat surrounding the renal mass was removed and the kidney was mobilized sufficiently for exposure and resection of the renal mass. This mass was located off the upper pole of the kidney as seen on preoperative imaging.  Intraoperative renal ultrasonography was utilized with the laparoscopic ultrasound probe to identify the renal tumor and  identify the tumor margins. The tumor was identified and was solid more superiorly and was a complex cystic mass more inferiorly. The margins were marked utilizing cautery at sites marked based on ultrasonography.    Once the renal mass was properly isolated, preparations were made for resection of the tumor.  Reconstructive sutures were placed into the abdomen for the renorrhaphy portion of the procedure.  The main renal artery and the upper pole artery were then clamped with bulldog clamps.  The tumor was then excised with cold scissor dissection along with an adequate visible gross margin of normal renal parenchyma. At the beginning of the resection, there was noted to be bleeding and therefore the lower pole artery was also clamped and hemostasis was improved. The tumor appeared to be excised without any gross violation of the tumor although the cystic portion of the lower portion of the mass did abut the margin.  An additional margin was removed in this section. The renal collecting system was not entered during removal of the tumor.  A running 3-0 V-lock suture was then brought through the capsule of the kidney and run along the base of the renal defect to provide hemostasis and close any entry into the renal collecting system if present. Weck clips were used to secure this suture outside the renal capsule at the proximal and distal ends. An additional hemostatic agent (Surgiflo) was then placed into the renal defect. A running 2-0 V lock suture was then used to close the renal capsule using a sliding clip technique which resulted in excellent compression of the renal defect.    The bulldog clamps were then removed from the renal hilar vessel(s) and an additional 12.5 g of IV mannitol was administered. Total warm renal ischemia time was 17 minutes. The renal tumor resection site was examined. Hemostasis appeared adequate.   The kidney was placed back into its normal anatomic position and covered with perinephric fat as needed.  A # 15 Blake drain was then brought through the lateral lower port site and positioned in the perinephric space.  It was secured to the skin with a nylon suture. The surgical cart was  undocked.  The renal tumor specimen was removed intact within an endopouch retrieval bag via the camera port sites.  The camera port site and the other 12 mm port site were then closed at the fascial level with 0-vicryl suture.  All other laparoscopic/robotic ports were removed under direct vision and the pneumoperitoneum let down with inspection of the operative field performed and hemostasis again confirmed. All incision sites were then injected with local anesthetic and reapproximated at the skin level with 4-0 monocryl subcuticular closures.  Dermabond was applied to the skin.  The patient tolerated the procedure well and without complications.  The patient was able to be extubated and transferred to the recovery unit in satisfactory condition.  Moody Bruins MD

## 2012-07-09 NOTE — Progress Notes (Signed)
Patient ID: [name], female   DOB: 1945-08-23, 67 y.o.   MRN: [id number]  Post-op note  Subjective: The patient is doing well.  No complaints.  Objective: Vital signs in last 24 hours: Temp:  [97.5 F (36.4 C)-98.5 F (36.9 C)] 98.1 F (36.7 C) (06/23 1658) Pulse Rate:  [65-95] 69 (06/23 1658) Resp:  [9-28] 18 (06/23 1658) BP: (134-195)/(56-107) 169/82 mmHg (06/23 1658) SpO2:  [89 %-98 %] 97 % (06/23 1658) Weight:  [70.308 kg (155 lb)] 70.308 kg (155 lb) (06/23 1658)  Intake/Output from previous day:   Intake/Output this shift:    Physical Exam:  General: Alert and oriented. Abdomen: Soft, Nondistended. Incisions: Clean and dry.  Lab Results:  Recent Labs  07/09/12 1458  HGB 13.0  HCT 37.0    Assessment/Plan: POD#0   1) Continue to monitor   Moody Bruins. MD   LOS: 0 days   Lataria Courser,LES 07/09/2012, 7:41 PM

## 2012-07-09 NOTE — Anesthesia Preprocedure Evaluation (Addendum)
Anesthesia Evaluation  Patient identified by MRN, date of birth, ID band Patient awake    Reviewed: Allergy & Precautions, H&P , NPO status , Patient's Chart, lab work & pertinent test results  History of Anesthesia Complications (+) MALIGNANT HYPERTHERMIA  Airway Mallampati: II TM Distance: >3 FB Neck ROM: Full    Dental no notable dental hx.    Pulmonary COPD breath sounds clear to auscultation  Pulmonary exam normal       Cardiovascular hypertension, + CAD, + Past MI, + Cardiac Stents and + Peripheral Vascular Disease Rhythm:Regular Rate:Normal  Left ventricle:  The cavity size was normal. Wall thickness was normal. Systolic function was normal. The estimated ejection fraction was in the range of 60% to 65%. Wall motion was normal; there were no regional wall motion abnormalities.    Neuro/Psych Idiopathic acute facial nerve palsy LEFT SIDE--  BOTOX THERAPY  negative psych ROS   GI/Hepatic negative GI ROS, Neg liver ROS, Alcoholism   recovering since 2000    Endo/Other  negative endocrine ROS  Renal/GU negative Renal ROS  negative genitourinary   Musculoskeletal negative musculoskeletal ROS (+)   Abdominal   Peds negative pediatric ROS (+)  Hematology negative hematology ROS (+)   Anesthesia Other Findings   Reproductive/Obstetrics negative OB ROS                        Anesthesia Physical Anesthesia Plan  ASA: III  Anesthesia Plan: General   Post-op Pain Management:    Induction: Intravenous  Airway Management Planned: Oral ETT  Additional Equipment:   Intra-op Plan:   Post-operative Plan: Extubation in OR  Informed Consent: I have reviewed the patients History and Physical, chart, labs and discussed the procedure including the risks, benefits and alternatives for the proposed anesthesia with the patient or authorized representative who has indicated his/her understanding  and acceptance.   Dental advisory given  Plan Discussed with: CRNA and Surgeon  Anesthesia Plan Comments:         Anesthesia Quick Evaluation

## 2012-07-09 NOTE — Transfer of Care (Signed)
Immediate Anesthesia Transfer of Care Note  Patient: [name]  Procedure(s) Performed: Procedure(s): ROBOTIC ASSITED PARTIAL NEPHRECTOMY (Left)  Patient Location: PACU  Anesthesia Type:General  Level of Consciousness: awake, alert , oriented and patient cooperative  Airway & Oxygen Therapy: Patient Spontanous Breathing and Patient connected to face mask oxygen  Post-op Assessment: Report given to PACU RN, Post -op Vital signs reviewed and stable and Patient moving all extremities  Post vital signs: Reviewed and stable  Complications: No apparent anesthesia complications

## 2012-07-09 NOTE — Preoperative (Signed)
Beta Blockers   Reason not to administer Beta Blockers:Not Applicable 

## 2012-07-10 ENCOUNTER — Encounter (HOSPITAL_COMMUNITY): Payer: Self-pay | Admitting: Urology

## 2012-07-10 LAB — HEMOGLOBIN AND HEMATOCRIT, BLOOD: Hemoglobin: 11.9 g/dL — ABNORMAL LOW (ref 12.0–15.0)

## 2012-07-10 LAB — BASIC METABOLIC PANEL
BUN: 9 mg/dL (ref 6–23)
CO2: 30 mEq/L (ref 19–32)
Chloride: 94 mEq/L — ABNORMAL LOW (ref 96–112)
GFR calc Af Amer: >90 mL/min (ref >90)
Potassium: 3.6 mEq/L (ref 3.5–5.1)

## 2012-07-10 MED ORDER — OXYCODONE-ACETAMINOPHEN 5-325 MG PO TABS
1.0000 | ORAL_TABLET | ORAL | Status: DC | PRN
  Administered 2012-07-10 – 2012-07-11 (×7): 2 via ORAL
  Administered 2012-07-11: 1 via ORAL
  Administered 2012-07-11 – 2012-07-12 (×2): 2 via ORAL
  Administered 2012-07-12: 1 via ORAL
  Filled 2012-07-10 (×11): qty 2

## 2012-07-10 MED ORDER — HYDROCODONE-ACETAMINOPHEN 5-325 MG PO TABS
1.0000 | ORAL_TABLET | Freq: Four times a day (QID) | ORAL | Status: DC | PRN
  Administered 2012-07-10: 2 via ORAL
  Filled 2012-07-10: qty 2

## 2012-07-10 MED ORDER — BISACODYL 10 MG RE SUPP
10.0000 mg | Freq: Once | RECTAL | Status: AC
  Administered 2012-07-10: 10 mg via RECTAL
  Filled 2012-07-10: qty 1

## 2012-07-10 NOTE — Progress Notes (Signed)
Pt's foley catheter was removed at 11am today, pt has not voided. Bladder scanned, 83ccs noted. Pt denies the urge to void at this time. Will cont to monitor.

## 2012-07-10 NOTE — Progress Notes (Signed)
Patient ID: [name], female   DOB: [birth year], 67 y.o.   MRN: [id number]  1 Day Post-Op Subjective: The patient is doing well.  No nausea or vomiting. Pain is adequately controlled.  Objective: Vital signs in last 24 hours: Temp:  [97.3 F (36.3 C)-99.5 F (37.5 C)] 97.3 F (36.3 C) (06/24 0600) Pulse Rate:  [60-95] 67 (06/24 0600) Resp:  [9-28] 16 (06/24 0600) BP: (91-195)/(51-107) 125/64 mmHg (06/24 0600) SpO2:  [89 %-98 %] 95 % (06/24 0600) Weight:  [70.308 kg (155 lb)] 70.308 kg (155 lb) (06/23 1658)  Intake/Output from previous day: 06/23 0701 - 06/24 0700 In: 2050 [I.V.:2050] Out: 2515 [Urine:2205; Drains:210; Blood:100] Intake/Output this shift:    Physical Exam:  General: Alert and oriented. CV: RRR Lungs: Clear bilaterally. GI: Soft, Nondistended. Incisions: Clean and dry. Urine: Clear Extremities: Nontender, no erythema, no edema.  Lab Results:  Recent Labs  07/09/12 1458 07/10/12 0437  HGB 13.0 11.9*  HCT 37.0 35.3*          Recent Labs  07/09/12 1458 07/10/12 0437  CREATININE 0.65 0.73           Results for orders placed during the hospital encounter of 07/09/12 (from the past 24 hour(s))  BASIC METABOLIC PANEL     Status: Abnormal   Collection Time    07/09/12  2:58 PM      Result Value Range   Sodium 129 (*) 135 - 145 mEq/L   Potassium 4.4  3.5 - 5.1 mEq/L   Chloride 91 (*) 96 - 112 mEq/L   CO2 28  19 - 32 mEq/L   Glucose, Bld 110 (*) 70 - 99 mg/dL   BUN 11  6 - 23 mg/dL   Creatinine, Ser 9.52  0.50 - 1.10 mg/dL   Calcium 9.8  8.4 - 84.1 mg/dL   GFR calc non Af Amer 90 (*) >90 mL/min   GFR calc Af Amer >90  >90 mL/min  HEMOGLOBIN AND HEMATOCRIT, BLOOD     Status: None   Collection Time    07/09/12  2:58 PM      Result Value Range   Hemoglobin 13.0  12.0 - 15.0 g/dL   HCT 32.4  40.1 - 02.7 %  BASIC METABOLIC PANEL     Status: Abnormal   Collection Time    07/10/12  4:37 AM      Result Value Range   Sodium 130 (*) 135 -  145 mEq/L   Potassium 3.6  3.5 - 5.1 mEq/L   Chloride 94 (*) 96 - 112 mEq/L   CO2 30  19 - 32 mEq/L   Glucose, Bld 138 (*) 70 - 99 mg/dL   BUN 9  6 - 23 mg/dL   Creatinine, Ser 2.53  0.50 - 1.10 mg/dL   Calcium 9.1  8.4 - 66.4 mg/dL   GFR calc non Af Amer 86 (*) >90 mL/min   GFR calc Af Amer >90  >90 mL/min  HEMOGLOBIN AND HEMATOCRIT, BLOOD     Status: Abnormal   Collection Time    07/10/12  4:37 AM      Result Value Range   Hemoglobin 11.9 (*) 12.0 - 15.0 g/dL   HCT 40.3 (*) 47.4 - 25.9 %    Assessment/Plan: POD# 1 s/p robotic partial nephrectomy.  1) Ambulate, Incentive spirometry - Pt uses a walker at baseline and I have placed a consult for physical therapy to assess. She may need inpatient rehab vs. HHPT upon discharge.  2) Advance diet as tolerated 3) Transition to oral pain medication 4) Dulcolax suppository 5) D/C urethral catheter   Moody Bruins. MD   LOS: 1 day   Nero Sawatzky,LES 07/10/2012, 7:21 AM

## 2012-07-10 NOTE — Evaluation (Signed)
Physical Therapy Evaluation Patient Details Name: [name] MRN: [id number] DOB: [birth year] Today's Date: 07/10/2012 Time: 8657-8469 PT Time Calculation (min): 31 min  PT Assessment / Plan / Recommendation Clinical Impression  67 y.o. who is POD #1 for  laparoscopic partial nephrectomy. Pt ambulated 120' with RW and supervision. Instructed pt in BLE strengthening exercises and encouraged frequent ambulation. Expect pt will be able to return home where she has 24* assist from a friend. No DME, no f/u PT indicated. Will follow acutely to maximize safety and independence with mobility.     PT Assessment  Patient needs continued PT services    Follow Up Recommendations  No PT follow up    Does the patient have the potential to tolerate intense rehabilitation      Barriers to Discharge None      Equipment Recommendations  None recommended by PT    Recommendations for Other Services     Frequency Min 3X/week    Precautions / Restrictions Precautions Precaution Comments: drain Restrictions Weight Bearing Restrictions: No   Pertinent Vitals/Pain *8/10 pain at abdominal incision site RN notified, pain meds requested**      Mobility  Bed Mobility Bed Mobility: Rolling Right;Right Sidelying to Sit Rolling Right: 3: Mod assist Right Sidelying to Sit: 3: Mod assist Details for Bed Mobility Assistance: Pt 60%, instructed pt in technique, assist to initiate roll and to elevate trunk Transfers Transfers: Sit to Stand;Stand to Sit Sit to Stand: 5: Supervision;From bed;With upper extremity assist Stand to Sit: 5: Supervision;With armrests;With upper extremity assist Details for Transfer Assistance: VCs hand placement Ambulation/Gait Ambulation/Gait Assistance: 5: Supervision Ambulation Distance (Feet): 120 Feet Assistive device: Rolling walker Ambulation/Gait Assistance Details: VCs to correct flexed neck Gait Pattern: Within Functional Limits Gait velocity: [facility]     Exercises General Exercises - Lower Extremity Ankle Circles/Pumps: AROM;Both;10 reps;Supine Quad Sets: AROM;Both;10 reps   PT Diagnosis: Acute pain;Difficulty walking  PT Problem List: Decreased activity tolerance;Decreased mobility;Pain PT Treatment Interventions: Gait training;DME instruction;Functional mobility training;Stair training;Therapeutic exercise;Therapeutic activities;Patient/family education   PT Goals Acute Rehab PT Goals PT Goal Formulation: With patient/family Time For Goal Achievement: 07/24/12 Potential to Achieve Goals: Good Pt will go Supine/Side to Sit: Independently PT Goal: Supine/Side to Sit - Progress: Goal set today Pt will go Sit to Stand: Independently PT Goal: Sit to Stand - Progress: Goal set today Pt will go Stand to Sit: Independently PT Goal: Stand to Sit - Progress: Goal set today Pt will Ambulate: >150 feet;with modified independence;with rolling walker PT Goal: Ambulate - Progress: Goal set today Pt will Go Up / Down Stairs: 1-2 stairs;with min assist;with rolling walker PT Goal: Up/Down Stairs - Progress: Goal set today Pt will Perform Home Exercise Program: Independently PT Goal: Perform Home Exercise Program - Progress: Goal set today  Visit Information  Last PT Received On: 07/10/12 Assistance Needed: +1    Subjective Data  Subjective: My stomach hurts.  Patient Stated Goal: to get back to walking   Prior Functioning  Home Living Lives With: Friend(s) Available Help at Discharge: Friend(s);Available 24 hours/day Type of Home: House Home Access: Stairs to enter [employer] of Steps: 1 Home Layout: One level Bathroom Shower/Tub: Tub/shower unit Home Adaptive Equipment: Walker - rolling;Grab bars in shower Prior Function Level of Independence: Independent with assistive device(s) Driving: Yes Communication Communication: No difficulties    Cognition  Cognition Arousal/Alertness: Awake/alert Behavior During  Therapy: WFL for tasks assessed/performed Overall Cognitive Status: Within Functional Limits for tasks assessed  Extremity/Trunk Assessment Right Upper Extremity Assessment RUE ROM/Strength/Tone: WFL for tasks assessed Left Upper Extremity Assessment LUE ROM/Strength/Tone: WFL for tasks assessed Right Lower Extremity Assessment RLE ROM/Strength/Tone: Within functional levels RLE Sensation: WFL - Light Touch RLE Coordination: WFL - gross/fine motor Left Lower Extremity Assessment LLE ROM/Strength/Tone: Within functional levels LLE Sensation: WFL - Light Touch LLE Coordination: WFL - gross/fine motor Trunk Assessment Trunk Assessment: Normal   Balance Balance Balance Assessed: Yes Static Sitting Balance Static Sitting - Balance Support: No upper extremity supported;Feet supported Static Sitting - Level of Assistance: 7: Independent Static Sitting - Comment/# of Minutes: 3  End of Session PT - End of Session Equipment Utilized During Treatment: Gait belt Activity Tolerance: Patient tolerated treatment well Patient left: in chair;with call bell/phone within reach;with family/visitor present Nurse Communication: Mobility status  GP     Tamala Ser 07/10/2012, 10:57 AM

## 2012-07-11 LAB — BASIC METABOLIC PANEL
BUN: 7 mg/dL (ref 6–23)
CO2: 28 mEq/L (ref 19–32)
Calcium: 8.5 mg/dL (ref 8.4–10.5)
Creatinine, Ser: 0.67 mg/dL (ref 0.50–1.10)
Glucose, Bld: 127 mg/dL — ABNORMAL HIGH (ref 70–99)

## 2012-07-11 MED ORDER — HYDROCODONE-ACETAMINOPHEN 5-325 MG PO TABS: 1.0000 | ORAL_TABLET | Freq: Four times a day (QID) | ORAL | Status: DC | PRN

## 2012-07-11 MED ORDER — BISACODYL 10 MG RE SUPP
10.0000 mg | Freq: Once | RECTAL | Status: AC
  Administered 2012-07-11: 10 mg via RECTAL

## 2012-07-11 MED ORDER — KCL IN DEXTROSE-NACL 20-5-0.9 MEQ/L-%-% IV SOLN
INTRAVENOUS | Status: DC
  Administered 2012-07-11: 21:00:00 via INTRAVENOUS
  Filled 2012-07-11 (×3): qty 1000

## 2012-07-11 MED ORDER — SODIUM CHLORIDE 0.9 % IV BOLUS (SEPSIS)
500.0000 mL | Freq: Once | INTRAVENOUS | Status: AC
  Administered 2012-07-11: 500 mL via INTRAVENOUS

## 2012-07-11 MED ORDER — DSS 100 MG PO CAPS: 100.0000 mg | ORAL_CAPSULE | Freq: Two times a day (BID) | ORAL | Status: DC

## 2012-07-11 NOTE — Clinical Documentation Improvement (Signed)
THIS DOCUMENT IS NOT A PERMANENT PART OF THE MEDICAL RECORD  Please update your documentation within the medical record to reflect your response to this query.    07/11/12   To Crecencio Mc, MD   Marton Redwood  In a better effort to capture your patient's severity of illness, reflect appropriate length of stay and utilization of resources, a review of the medical record has revealed the following indicators.    Based on your clinical judgment, please clarify and document in a progress note and/or discharge summary the clinical condition associated with the following supporting information:  In responding to this query please exercise your independent judgment.  The fact that a query is asked, does not imply that any particular answer is desired or expected.  Abnormal findings (laboratory, x-ray, pathologic, and other diagnostic results) are not coded and reported unless the physician indicates their clinical significance.   The medical record reflects the following clinical findings, please clarify the diagnostic and/or clinical significance:      Noted patient's sodium level  131 (L) 6/25,  130 (L) 6/24, 129 (L) 6/23, 128 (L) 07/02/12, 135 03/07/12.  IvF D5 NS @ 100 ml/hr post op. Please document secondary condition if appropriate.  Thank you  Possible Clinical Conditions?                                          SIADH  Hyponatremia             Other Condition              Cannot Clinically Determine    Treatment D5 NS @ 100 ml/ hr   Monitoring  BMET daily   Reviewed:  no additional documentation provided   Thank You,  Lavonda Jumbo, BSN,  Clinical Documentation Specialist :  For assistance in how to respond to this query  please call or page the following numbers  [phone] pgr  [phone] off   Thank You,  Health Information Management 

## 2012-07-11 NOTE — Progress Notes (Signed)
Received a page from [place], [place], that patient has bladder scan volume of 180 ml and no urge to void urine.  Also no urinary output since foley cath removal at 11am yesterday morning.  Vitals stable normal 125/64, 62 pulse, afebrile, Hgb stable low 11.9 and K and creatinine normal 0.73.  Patient is asymptomatic. Oliguria is most likely pre-renal.  Verbal order to increase IVF rate from 75cc/hr to 100cc/hr.  No other intervention for now.  Will continue to monitor.  If repeated bladder scan indicates > 250 ml and patient unable to void, will need re-insertion of Foley catheter.

## 2012-07-11 NOTE — Progress Notes (Signed)
#  16 foley catheter placed and 450cc of amber urine drained into bag. Pt medicated for pain earlier. Will cont to monitor.

## 2012-07-11 NOTE — Progress Notes (Addendum)
Pt's bladder scanned, less than 100cc noted. Pt up to bsc, has the urge to void unable to. [employer] NP notified and orders received to place foley catheter.

## 2012-07-11 NOTE — Progress Notes (Signed)
Patient ID: [name], female   DOB: 1945-08-04, 67 y.o.   MRN: [id number]  2 Days Post-Op Subjective: Pt unable to void overnight. No flatus or BM. No nausea or vomiting. Ambulate well with walker. Pain better controlled.  Objective: Vital signs in last 24 hours: Temp:  [97.9 F (36.6 C)-98.7 F (37.1 C)] 98.7 F (37.1 C) (06/25 0523) Pulse Rate:  [60-70] 70 (06/25 0523) Resp:  [16-18] 18 (06/25 0523) BP: (93-149)/(51-71) 149/71 mmHg (06/25 0523) SpO2:  [87 %-98 %] 97 % (06/25 0523)  Intake/Output from previous day: 06/24 0701 - 06/25 0700 In: 940 [P.O.:340; I.V.:600] Out: 1000 [Urine:950; Drains:50] Intake/Output this shift:    Physical Exam:  General: Alert and oriented CV: RRR Lungs: Clear Abdomen: Soft, Slightly distended, Minimal BS Incisions: C/D/I Ext: NT, No erythema  Lab Results:  Recent Labs  07/09/12 1458 07/10/12 0437 07/11/12 0640  HGB 13.0 11.9* 10.3*  HCT 37.0 35.3* 30.5*   BMET  Recent Labs  07/10/12 0437 07/11/12 0640  NA 130* 131*  K 3.6 3.3*  CL 94* 98  CO2 30 28  GLUCOSE 138* 127*  BUN 9 7  CREATININE 0.73 0.67  CALCIUM 9.1 8.5     Studies/Results: No results found.  Assessment/Plan: - Continue to ambulate, IS - Will D/C foley that was placed this morning and give another voiding trial - Follow Hgb since patient is on aspirin - Follow renal function - Check drain creatinine   LOS: 2 days   Perry Molla,LES 07/11/2012, 7:38 AM

## 2012-07-11 NOTE — Progress Notes (Addendum)
Bladder scanned, 183cc's noted. Pt assisted to bedside commode. No results. NP on call, Nadine paged.

## 2012-07-11 NOTE — Progress Notes (Signed)
Patient ID: [name], female   DOB: 1945/12/06, 67 y.o.   MRN: [id number]  Pt doing well.  Ambulating with walker without difficulty. Her catheter was not removed until around 2 or 3 PM.  She has yet to void. She is passing flatus and continues to tolerate a regular diet.  Pathology: pT1a Nx Mx, Fuhrman grade II clear cell renal cell carcinoma with negative surgical margins.  Bladder scan: 83 cc  I discussed path results with [name].  I also checked a bladder scan.  I will give her a fluid bolus this evening and will recommend catheter replacement only if she develops a strong urge to void but cannot or if bladder scan is > 500 cc.

## 2012-07-12 ENCOUNTER — Encounter: Payer: Self-pay | Admitting: Cardiology

## 2012-07-12 LAB — BASIC METABOLIC PANEL
Chloride: 102 mEq/L (ref 96–112)
GFR calc Af Amer: >90 mL/min (ref >90)
GFR calc non Af Amer: >90 mL/min (ref >90)
Potassium: 3.2 mEq/L — ABNORMAL LOW (ref 3.5–5.1)
Sodium: 133 mEq/L — ABNORMAL LOW (ref 135–145)

## 2012-07-12 LAB — HEMOGLOBIN AND HEMATOCRIT, BLOOD
HCT: 28.7 % — ABNORMAL LOW (ref 36.0–46.0)
Hemoglobin: 9.7 g/dL — ABNORMAL LOW (ref 12.0–15.0)

## 2012-07-12 MED ORDER — OXYCODONE-ACETAMINOPHEN 5-325 MG PO TABS: 1.0000 | ORAL_TABLET | ORAL | Status: DC | PRN

## 2012-07-12 MED ORDER — DOCUSATE SODIUM 100 MG PO CAPS: 100.0000 mg | ORAL_CAPSULE | Freq: Two times a day (BID) | ORAL | Status: DC

## 2012-07-12 NOTE — Progress Notes (Signed)
Patient ID: [name], female   DOB: [birth year], 67 y.o.   MRN: [id number]  3 Days Post-Op Subjective: Pt was able to void last night.  She also had a bowel movement. Has been ambulating well with walker and pain controlled.  Objective: Vital signs in last 24 hours: Temp:  [97.4 F (36.3 C)-98.3 F (36.8 C)] 98.3 F (36.8 C) (06/26 0532) Pulse Rate:  [65-78] 78 (06/26 0532) Resp:  [18-20] 18 (06/26 0532) BP: (140-157)/(70-78) 150/70 mmHg (06/26 0532) SpO2:  [65 %-98 %] 98 % (06/26 0532)  Intake/Output from previous day: 06/25 0701 - 06/26 0700 In: 1133.3 [P.O.:180; I.V.:953.3] Out: 1132 [Urine:1125; Drains:7] Intake/Output this shift:    Physical Exam:  General: Alert and oriented CV: RRR Lungs: Clear Abdomen: Soft, ND, Normal BS Incisions: C/D/I Ext: NT, No erythema  Lab Results:  Recent Labs  07/10/12 0437 07/11/12 0640 07/12/12 0450  HGB 11.9* 10.3* 9.7*  HCT 35.3* 30.5* 28.7*   BMET  Recent Labs  07/11/12 0640 07/12/12 0450  NA 131* 133*  K 3.3* 3.2*  CL 98 102  CO2 28 25  GLUCOSE 127* 122*  BUN 7 7  CREATININE 0.67 0.58  CALCIUM 8.5 8.3*     Studies/Results: No results found.  Assessment/Plan: - Discharge home - SL IV - D/C drain (could not check creatinine level due to scant output) - F/U to repeat BMP as scheduled   LOS: 3 days   Ashlinn Hemrick,LES 07/12/2012, 7:51 AM

## 2012-07-12 NOTE — Discharge Summary (Signed)
Date of admission: 07/09/2012  Date of discharge: 07/12/2012  Admission diagnosis: Left renal neoplasm  Discharge diagnosis: pT1a Nx Mx, Fuhrman grade II clear cell renal cell carcinoma with negative surgical margins   Secondary diagnoses: COPD, CAD, MI, GERD, HTN, and ETOH abuse  History and Physical: For full details, please see admission history and physical. Briefly, [name] is a 67 y.o. 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 She was treated with high dose radiation in February 2014 under the care of Dr. Lonie Peak for this recurrence. She is followed by Dr. Cleda Mccreedy and had a recent PAP smear and is currently awaiting thos results. She has been incidentally noted to have a 4.4 cm left upper pole renal cystic mass noted on CT imaging and further characterized on an MRI on 03/03/12 which demostrated a 4.4 x 4.1 cm mostly exophytic left upper pole partially cystic mass with enhancing solid components. This mass was confirmed to appear solid on ultrasound. She had bilateral simple renal cysts as well and a circumaortic left renal vein. Her baseline renal function is normal with a baseline Cr of 0.70. A CXR on 03/02/12 demonstrated evidence of COPD but no metastases.  She also has a history of coronary artery disease status post cardiac stent placement in 2000. She is followed by Dr. Peter [place] who she has not seen in over one year. She does take aspirin 81 mg. She was incidentally determined to have COPD on imaging studies from her hospitalization in January. She is currently on therapy including areola, do i, and albuterol. She has not had any recent chest pain, shortness of breath, difficulty breathing or wheezing, or palpitations. Finally, she also has known carotid artery stenosis and  underwent a recent Doppler ultrasound which demonstrated stability and moderate stenosis of her right carotid artery. 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 She receives Botox injections periodically for management but is assured that she has not had a history of a stroke.  She otherwise denies any abdominal pain, hematuria, or family history of kidney cancer or end-stage renal disease.   Hospital Course:  Pt was admitted and taken to the OR on 07/09/12 for left robotic assisted laparoscopic partial nephrectomy.  Pt tolerated the procedure well and was hemodynamically stable immediately post op.  She was extubated without complication and woke up from anesthesia neurologically intact. Her vitals remained stable throughout out the post op course.  Her only complication was an inability to void after her foley cath was removed on POD 1. It was replaced early in the morning of POD 2 and then d/c'd again later on POD 2.  She subsequently voided without difficulty.  She was able to ambulate at her baseline with a walker and tolerated a regular diet.  She was felt to be in stable condition for d/c home on POD 3.     Laboratory values:  Recent Labs  07/10/12 0437 07/11/12 0640 07/12/12 0450  HGB 11.9* 10.3* 9.7*  HCT 35.3* 30.5* 28.7*    Recent Labs  07/11/12 0640 07/12/12 0450  CREATININE 0.67 0.58    Disposition: Home  Discharge instruction: The patient was instructed to be ambulatory but told to refrain from heavy lifting, strenuous activity, or driving.   Discharge medications:  Medication List    STOP taking these medications       calcium-vitamin D 500-200 MG-UNIT per tablet  Commonly known as:  OSCAL WITH D     multivitamin with minerals Tabs     Vitamin D3 2000 UNITS Tabs      TAKE these medications       albuterol 108 (90 BASE) MCG/ACT inhaler  Commonly known as:  PROVENTIL HFA;VENTOLIN HFA  Inhale 2 puffs into the  lungs every 6 (six) hours as needed for wheezing.     ARIPiprazole 15 MG tablet  Commonly known as:  ABILIFY  Take 15 mg by mouth every morning.     aspirin EC 81 MG tablet  Take 81 mg by mouth every morning.     clonazePAM 1 MG tablet  Commonly known as:  KLONOPIN  Take 1 mg by mouth 3 (three) times daily as needed for anxiety.     cloNIDine 0.2 MG tablet  Commonly known as:  CATAPRES  Take 0.2 mg by mouth 2 (two) times daily.     conjugated estrogens vaginal cream  Commonly known as:  PREMARIN  Place 0.5 g vaginally daily before supper.     DSS 100 MG Caps  Take 100 mg by mouth 2 (two) times daily.     docusate sodium 100 MG capsule  Commonly known as:  COLACE  Take 1 capsule (100 mg total) by mouth 2 (two) times daily.     esomeprazole 20 MG capsule  Commonly known as:  NEXIUM  Take 20 mg by mouth daily before breakfast.     furosemide 20 MG tablet  Commonly known as:  LASIX  Take 20 mg by mouth every morning.     HYDROcodone-acetaminophen 5-325 MG per tablet  Commonly known as:  NORCO/VICODIN  Take 1-2 tablets by mouth every 6 (six) hours as needed for pain.     lamoTRIgine 100 MG tablet  Commonly known as:  LAMICTAL  Take 200 mg by mouth 2 (two) times daily.     mometasone-formoterol 100-5 MCG/ACT Aero  Commonly known as:  DULERA  Inhale 2 puffs into the lungs 2 (two) times daily.     olmesartan-hydrochlorothiazide 40-25 MG per tablet  Commonly known as:  BENICAR HCT  Take 1 tablet by mouth every morning.     oxyCODONE-acetaminophen 5-325 MG per tablet  Commonly known as:  ROXICET  Take 1 tablet by mouth every 4 (four) hours as needed for pain.     potassium chloride 8 MEQ tablet  Commonly known as:  KLOR-CON  Take 8 mEq by mouth daily.     rosuvastatin 5 MG tablet  Commonly known as:  CRESTOR  Take 5 mg by mouth at bedtime.     sertraline 50 MG tablet  Commonly known as:  ZOLOFT  Take 50 mg by mouth every morning.     SURGILUBE EX  Place 1  application vaginally daily before supper.     tiotropium 18 MCG inhalation capsule  Commonly known as:  SPIRIVA  Place 18 mcg into inhaler and inhale daily. EVENING        Followup:      Follow-up Information   Follow up with Crecencio Mc, MD On 07/31/2012. (at 10:15)    Contact information:   [address] [place] [address] [phone]

## 2012-07-17 DIAGNOSIS — R609 Edema, unspecified: Secondary | ICD-10-CM | POA: Diagnosis not present

## 2012-07-30 DIAGNOSIS — R634 Abnormal weight loss: Secondary | ICD-10-CM | POA: Diagnosis not present

## 2012-07-30 DIAGNOSIS — R609 Edema, unspecified: Secondary | ICD-10-CM | POA: Diagnosis not present

## 2012-07-30 DIAGNOSIS — J449 Chronic obstructive pulmonary disease, unspecified: Secondary | ICD-10-CM | POA: Diagnosis not present

## 2012-07-30 DIAGNOSIS — K5900 Constipation, unspecified: Secondary | ICD-10-CM | POA: Diagnosis not present

## 2012-07-30 DIAGNOSIS — K219 Gastro-esophageal reflux disease without esophagitis: Secondary | ICD-10-CM | POA: Diagnosis not present

## 2012-07-30 DIAGNOSIS — E785 Hyperlipidemia, unspecified: Secondary | ICD-10-CM | POA: Diagnosis not present

## 2012-07-30 DIAGNOSIS — F319 Bipolar disorder, unspecified: Secondary | ICD-10-CM | POA: Diagnosis not present

## 2012-07-30 DIAGNOSIS — I10 Essential (primary) hypertension: Secondary | ICD-10-CM | POA: Diagnosis not present

## 2012-07-31 DIAGNOSIS — C649 Malignant neoplasm of unspecified kidney, except renal pelvis: Secondary | ICD-10-CM | POA: Diagnosis not present

## 2012-08-16 ENCOUNTER — Encounter: Payer: Self-pay | Admitting: Radiation Oncology

## 2012-08-16 DIAGNOSIS — G245 Blepharospasm: Secondary | ICD-10-CM | POA: Diagnosis not present

## 2012-08-17 ENCOUNTER — Ambulatory Visit
Admission: RE | Admit: 2012-08-17 | Discharge: 2012-08-17 | Disposition: A | Discharge status: Home / Self Care | Payer: Medicare Other | Source: Ambulatory Visit | Attending: Radiation Oncology | Admitting: Radiation Oncology

## 2012-08-17 ENCOUNTER — Encounter: Payer: Self-pay | Admitting: Radiation Oncology

## 2012-08-17 VITALS — BP 151/85 | HR 47 | Temp 97.8°F | Ht 65.0 in | Wt 156.3 lb

## 2012-08-17 DIAGNOSIS — C52 Malignant neoplasm of vagina: Secondary | ICD-10-CM

## 2012-08-17 HISTORY — DX: Malignant neoplasm of unspecified kidney, except renal pelvis: C64.9

## 2012-08-17 NOTE — Progress Notes (Signed)
[name] here for fu s/p post radiation to the vaginal cuff.  She denies any pain,  bleeding, discharge, dysuria, or proctitis, but she reports itching at the top of the vaginal area.  She reports that she had surgery on 07/09/12 for a mass in the left kidney.  It was a clear cell renal cell carcinoma.  She had a wedge resection with clear margins.  No other treatment recommended and she will have a futrue scan of the chest since she was told this is the most likely place for metastases.

## 2012-08-17 NOTE — Progress Notes (Signed)
Radiation Oncology         [phone] ________________________________  Name: [name] MRN: [id number]  Date: 08/17/2012  DOB: [birth year]  Follow-Up Visit Note  Outpatient  CC: Cala Bradford, MD  Thompson Caul., MD  Diagnosis and Prior Radiotherapy:   Squamous cell carcinoma at the vaginal cuff - recurrent cervical cancer presenting as stage I vaginal cancer   She is status post 38.75 Gray in 5 fractions, high dose rate vaginal cuff brachytherapy, 5.5 cm length of proximal vagina treated. She completed treatment on 02/21/2012  Narrative:  The patient returns today for routine follow-up.                        On 07/09/2012 she underwent left robotic-assisted laparoscopic partial nephrectomy. Pathology revealed   pT1a Nx Mx, Fuhrman grade II clear cell renal cell carcinoma with negative surgical margins. Her notes by urology document a good prognosis.      Pap smear on 05/24/2012 revealed: Satisfactory but limited for evaluation, see comment. Partially obscuring inflammation is present. Diagnosis ATYPICAL SQUAMOUS CELLS OF UNDETERMINED SIGNIFICANCE (ASC-US).  Pelvic exam by Dr.Gehrig on that date revealed no visible lesions. Dr. Denman George recommendations for a repeat pap smear in September 2014. Appt scheduled for Sept. 4, 2014 at 10:30am. Dr. Duard Brady also wanted the patient to continue using estrogen vaginally. She has not been compliant with this. She has used her vaginal dilator intermittently. She reports vulva itching x 2-3 weeks. No Vaginal discharge, no GU/GI complaints.  ALLERGIES:  has No Known Allergies.  Meds: Current Outpatient Prescriptions  Medication Sig Dispense Refill  . ARIPiprazole (ABILIFY) 15 MG tablet Take 15 mg by mouth every morning.       Marland Kitchen aspirin EC 81 MG tablet Take 81 mg by mouth every morning.       . clonazePAM (KLONOPIN) 1 MG tablet Take 1 mg by mouth 3 (three) times daily as needed for anxiety.       . cloNIDine (CATAPRES) 0.2 MG tablet  Take 0.2 mg by mouth 2 (two) times daily.       Marland Kitchen docusate sodium (COLACE) 100 MG capsule Take 1 capsule (100 mg total) by mouth 2 (two) times daily.  30 capsule  0  . esomeprazole (NEXIUM) 20 MG capsule Take 20 mg by mouth daily before breakfast.      . furosemide (LASIX) 20 MG tablet Take 20 mg by mouth every morning.      Marland Kitchen HYDROcodone-acetaminophen (NORCO/VICODIN) 5-325 MG per tablet Take 1-2 tablets by mouth every 6 (six) hours as needed for pain.  20 tablet  0  . lamoTRIgine (LAMICTAL) 100 MG tablet Take 200 mg by mouth 2 (two) times daily.       . mometasone-formoterol (DULERA) 100-5 MCG/ACT AERO Inhale 2 puffs into the lungs 2 (two) times daily.  1 Inhaler  3  . olmesartan-hydrochlorothiazide (BENICAR HCT) 40-25 MG per tablet Take 1 tablet by mouth every morning.       Marland Kitchen oxyCODONE-acetaminophen (ROXICET) 5-325 MG per tablet Take 1 tablet by mouth every 4 (four) hours as needed for pain.  30 tablet  0  . potassium chloride (KLOR-CON) 8 MEQ tablet Take 8 mEq by mouth daily.      . rosuvastatin (CRESTOR) 5 MG tablet Take 5 mg by mouth at bedtime.      . sertraline (ZOLOFT) 50 MG tablet Take 50 mg by mouth every morning.       Marland Kitchen  tiotropium (SPIRIVA) 18 MCG inhalation capsule Place 18 mcg into inhaler and inhale daily. EVENING      . albuterol (PROVENTIL HFA;VENTOLIN HFA) 108 (90 BASE) MCG/ACT inhaler Inhale 2 puffs into the lungs every 6 (six) hours as needed for wheezing.      . conjugated estrogens (PREMARIN) vaginal cream Place 0.5 g vaginally daily before supper.      . Lubricants (SURGILUBE EX) Place 1 application vaginally daily before supper.        No current facility-administered medications for this encounter.    Physical Findings: The patient is in no acute distress. Patient is alert and oriented.  height is 5\' 5"  (1.651 m) and weight is 156 lb 4.8 oz (70.897 kg). Her temperature is 97.8 F (36.6 C). Her blood pressure is 151/85 and her pulse is 47. .  No adenopathy appreciated  in groin. Vulva slightly erythematous but no gross lesions.  Vaginal vault -no lesions appreciated on speculum exam. Scant bleeding at superior aspect of vault, possibly from advancement of speculum. Thin white discharge from vagina.  Lab Findings: Lab Results  Component Value Date   WBC 5.6 07/02/2012   HGB 9.7* 07/12/2012   HCT 28.7* 07/12/2012   MCV 87.3 07/02/2012   PLT 323 07/02/2012    Radiographic Findings: No results found.  Impression/Plan:  Doing relatively well overall. Encouraged her to followup with Dr. Duard Brady for Pap smear next month. Encouraged her to resume Premarin per Dr Duard Brady and continue using her vaginal dilator. I'm not sure of the cause of her vulvar itching. In case of a yeast infection I recommended using Monistat cream internally in vagina and externally over vulva for a five-day regimen. I will be on maternity leave at the end of this year in early next year and therefore will schedule her to followup with me in late February or early March when I am back in clinic.  I spent 20 minutes face to face with the patient and more than 50% of that time was spent in counseling and/or coordination of care. _____________________________________   Lonie Peak, MD

## 2012-08-22 ENCOUNTER — Other Ambulatory Visit: Payer: Self-pay

## 2012-08-23 DIAGNOSIS — R1032 Left lower quadrant pain: Secondary | ICD-10-CM | POA: Diagnosis not present

## 2012-08-23 DIAGNOSIS — C649 Malignant neoplasm of unspecified kidney, except renal pelvis: Secondary | ICD-10-CM | POA: Diagnosis not present

## 2012-08-30 DIAGNOSIS — K5900 Constipation, unspecified: Secondary | ICD-10-CM | POA: Diagnosis not present

## 2012-08-31 DIAGNOSIS — N289 Disorder of kidney and ureter, unspecified: Secondary | ICD-10-CM | POA: Diagnosis not present

## 2012-09-03 DIAGNOSIS — E871 Hypo-osmolality and hyponatremia: Secondary | ICD-10-CM | POA: Diagnosis not present

## 2012-09-06 DIAGNOSIS — G245 Blepharospasm: Secondary | ICD-10-CM | POA: Diagnosis not present

## 2012-09-07 DIAGNOSIS — E871 Hypo-osmolality and hyponatremia: Secondary | ICD-10-CM | POA: Diagnosis not present

## 2012-09-07 DIAGNOSIS — M545 Low back pain: Secondary | ICD-10-CM | POA: Diagnosis not present

## 2012-09-14 DIAGNOSIS — R609 Edema, unspecified: Secondary | ICD-10-CM | POA: Diagnosis not present

## 2012-09-20 ENCOUNTER — Other Ambulatory Visit (HOSPITAL_COMMUNITY)
Admission: RE | Admit: 2012-09-20 | Discharge: 2012-09-20 | Disposition: A | Discharge status: Home / Self Care | Payer: Medicare Other | Source: Ambulatory Visit | Attending: Gynecologic Oncology | Admitting: Gynecologic Oncology

## 2012-09-20 ENCOUNTER — Ambulatory Visit: Payer: Medicare Other | Attending: Gynecologic Oncology | Admitting: Gynecologic Oncology

## 2012-09-20 ENCOUNTER — Encounter: Payer: Self-pay | Admitting: Gynecologic Oncology

## 2012-09-20 VITALS — BP 165/100 | HR 60 | Temp 98.0°F | Resp 18 | Ht 65.0 in | Wt 154.4 lb

## 2012-09-20 DIAGNOSIS — I10 Essential (primary) hypertension: Secondary | ICD-10-CM | POA: Insufficient documentation

## 2012-09-20 DIAGNOSIS — C519 Malignant neoplasm of vulva, unspecified: Secondary | ICD-10-CM

## 2012-09-20 DIAGNOSIS — N952 Postmenopausal atrophic vaginitis: Secondary | ICD-10-CM | POA: Insufficient documentation

## 2012-09-20 DIAGNOSIS — I2510 Atherosclerotic heart disease of native coronary artery without angina pectoris: Secondary | ICD-10-CM | POA: Diagnosis not present

## 2012-09-20 DIAGNOSIS — L293 Anogenital pruritus, unspecified: Secondary | ICD-10-CM | POA: Insufficient documentation

## 2012-09-20 DIAGNOSIS — Z923 Personal history of irradiation: Secondary | ICD-10-CM | POA: Diagnosis not present

## 2012-09-20 DIAGNOSIS — N894 Leukoplakia of vagina: Secondary | ICD-10-CM | POA: Insufficient documentation

## 2012-09-20 DIAGNOSIS — Z87891 Personal history of nicotine dependence: Secondary | ICD-10-CM | POA: Diagnosis not present

## 2012-09-20 DIAGNOSIS — J438 Other emphysema: Secondary | ICD-10-CM | POA: Insufficient documentation

## 2012-09-20 DIAGNOSIS — M810 Age-related osteoporosis without current pathological fracture: Secondary | ICD-10-CM | POA: Insufficient documentation

## 2012-09-20 DIAGNOSIS — Z124 Encounter for screening for malignant neoplasm of cervix: Secondary | ICD-10-CM | POA: Diagnosis not present

## 2012-09-20 DIAGNOSIS — Z79899 Other long term (current) drug therapy: Secondary | ICD-10-CM | POA: Diagnosis not present

## 2012-09-20 DIAGNOSIS — C52 Malignant neoplasm of vagina: Secondary | ICD-10-CM | POA: Diagnosis not present

## 2012-09-20 NOTE — Patient Instructions (Signed)
Followup with Dr. Karoline Caldwell in 3 months and return to see GYN oncology in 6 months

## 2012-09-20 NOTE — Progress Notes (Signed)
Consult Note: Gyn-Onc  [name] 67 y.o. female  CC:  Chief Complaint  Patient presents with  . Vaginal Cancer    Follow up appt.    HPI: [name] is a very pleasant 67 year old with a 10 year history of abnormal Pap smears. In April 2011 she had a Pap smear revealed atypical squamous cells cannot exclude a high-grade dysplasia. She was worked up and evaluated and subsequently underwent a vaginal hysterectomy in June of 2011. Pathology at that time revealed the cervix with high-grade squamous dysplasia. Focal adenomyosis. In addition, the ectocervical margin of the cervix was positive. She was seen by Dr. Arlyce Dice on October 29 at which time a Pap smear revealed VAIN2/VAIN 3.   She underwent laser ablation on for dysplasia on February 07, 2010. Operative findings included a raised hyperkeratotic lesion in the vagina encompassing the entire vaginal cuff. The area was acetowhite changing. The total area measured approximately 4 x 4 centimeters.  I saw her in June 2013 her Pap smear was normal. She subsequently saw Dr. Arlyce Dice October 30, 13 and her Pap smear returned as atypical squamous cells of undetermined significance. The patient has positive high-risk HPV.  I saw her in late 2013 acetowhite epithelial changes at the top of the vagina. However, there was a punctate area with telangiectasias in the upper right vaginal fornix. A biopsy of this was performed. It revealed a squamous cell carcinoma.   She completed 5 fractions of HDR with Dr. Karoline Caldwell in 2/14. She was ultimately diagnosed with a renal cell carcinoma and is status post a partial left nephrectomy 07/09/2012. The pathology revealed a T1-1 grade 2 clear cell renal cell carcinoma. The margins were negative and she was dispositioned to close followup. She comes in today for followup Pap smear. Last Pap smear performed in may of 2014 revealed atypical squamous cells of undetermined significance. However, she just finished her brachii  therapy on February 4. The plan therefore was her to return to see [place] for a repeat Pap smear today. She has been using her vaginal dilators and her vaginal Premarin cream.  Review of Systems  Constitutional: No complaints, she has lost some weight but that is something she is happy about. She states that with all the procedures her appetite has been somewhat diminished but she is feeling well. Skin: No rash, sores, jaundice, itching, or dryness.  Cardiovascular: No chest pain, shortness of breath, or edema  Pulmonary: No cough or wheeze.  Gastro Intestinal: No nausea, vomiting, slight constipation, or diarrhea reported. No bright red blood per rectum or change in bowel movement.  Genitourinary: No frequency, urgency, or dysuria.  Denies vaginal bleeding and discharge. She does complain of some external vulvar pruritus. She tried Monistat without significant benefit. Musculoskeletal: No myalgia, arthralgia, joint swelling or pain.  Neurologic: No changes Psychology: No issues '  Current Meds:  Outpatient Encounter Prescriptions as of 09/20/2012  Medication Sig Dispense Refill  . albuterol (PROVENTIL HFA;VENTOLIN HFA) 108 (90 BASE) MCG/ACT inhaler Inhale 2 puffs into the lungs every 6 (six) hours as needed for wheezing.      . ARIPiprazole (ABILIFY) 15 MG tablet Take 15 mg by mouth every morning.       Marland Kitchen aspirin EC 81 MG tablet Take 81 mg by mouth every morning.       . clonazePAM (KLONOPIN) 1 MG tablet Take 1 mg by mouth 3 (three) times daily as needed for anxiety.       . cloNIDine (CATAPRES)  0.2 MG tablet Take 0.2 mg by mouth 2 (two) times daily.       Marland Kitchen conjugated estrogens (PREMARIN) vaginal cream Place 0.5 g vaginally daily before supper.      . docusate sodium (COLACE) 100 MG capsule Take 1 capsule (100 mg total) by mouth 2 (two) times daily.  30 capsule  0  . esomeprazole (NEXIUM) 20 MG capsule Take 20 mg by mouth daily before breakfast.      . furosemide (LASIX) 20 MG tablet Take 20 mg  by mouth every morning.      Marland Kitchen HYDROcodone-acetaminophen (NORCO/VICODIN) 5-325 MG per tablet Take 1-2 tablets by mouth every 6 (six) hours as needed for pain.  20 tablet  0  . lamoTRIgine (LAMICTAL) 100 MG tablet Take 200 mg by mouth 2 (two) times daily.       . Lubricants (SURGILUBE EX) Place 1 application vaginally daily before supper.       . mometasone-formoterol (DULERA) 100-5 MCG/ACT AERO Inhale 2 puffs into the lungs 2 (two) times daily.  1 Inhaler  3  . olmesartan-hydrochlorothiazide (BENICAR HCT) 40-25 MG per tablet Take 1 tablet by mouth every morning.       Marland Kitchen oxyCODONE-acetaminophen (ROXICET) 5-325 MG per tablet Take 1 tablet by mouth every 4 (four) hours as needed for pain.  30 tablet  0  . potassium chloride (KLOR-CON) 8 MEQ tablet Take 8 mEq by mouth daily.      . rosuvastatin (CRESTOR) 5 MG tablet Take 5 mg by mouth at bedtime.      . sertraline (ZOLOFT) 50 MG tablet Take 50 mg by mouth every morning.       . tiotropium (SPIRIVA) 18 MCG inhalation capsule Place 18 mcg into inhaler and inhale daily. EVENING       No facility-administered encounter medications on file as of 09/20/2012.    Allergy: No Known Allergies  Social Hx:   History   Social History  . Marital Status: Divorced    Spouse Name: N/A    Number of Children: N/A  . Years of Education: N/A   Occupational History  . Not on file.   Social History Main Topics  . Smoking status: Former Smoker -- 2.00 packs/day for 50 years    Types: Cigarettes    Quit date: 01/18/2011  . Smokeless tobacco: Never Used     Comment: STATES QUIT SMOKING 01-18-2011  . Alcohol Use: No     Comment: RECOVERING ALCOHOLIC--   QUIT IN 2000  . Drug Use: No  . Sexual Activity: No   Other Topics Concern  . Not on file   Social History Narrative  . No narrative on file    Past Surgical Hx:  Past Surgical History  Procedure Laterality Date  . Total knee arthroplasty  09-22-2009    RIGHT  . Vaginal hysterectomy  07-06-2009  .  Hemiarthroplasty hip  12-26-2008    LEFT FEMORAL NECK FX  . Total hip arthroplasty  04-15-2008    POST FAILED  RIGHT HIP ORIF FEMORAL FX  . Cervical conization w/bx  09-23-2008  . Orif hip fracture  02-13-2007    RIGHT FEMORAL NECK FX  . Right shoulder surg.  2007  . Coronary angioplasty with stent placement  2000-   INFERIOR MI    X1 STENT TO RCA  . Carotid endarterectomy  1995    RIGHT  . Cataract extraction w/ intraocular lens  implant, bilateral    . Orif right distal radius and  right proximal humerous neck fx's  10-10-2005  . Upper right vaginal region  12/28/11    BIOPSY: SQUAMOUS CELL CARCINOMA  . Eus N/A 03/05/2012    Procedure: FULL UPPER ENDOSCOPIC ULTRASOUND (EUS) RADIAL and EGD;  Surgeon: Rachael Fee, MD;  Location: [facility];  Service: Endoscopy;  Laterality: N/A;  ercp scope first than eus scope  . Robotic assited partial nephrectomy Left 07/09/2012    Procedure: ROBOTIC ASSITED PARTIAL NEPHRECTOMY;  Surgeon: Crecencio Mc, MD;  Location: [facility];  Service: Urology;  Laterality: Left;    Past Medical Hx:  Past Medical History  Diagnosis Date  . Hypertension   . Depression   . Alcoholism     recovering since 2000  . Inferior MI 2000--  POST PTCA W/ STENT X1  . Bipolar disorder   . Peripheral vascular disease POST RIGHT CAROTID SURG.  1995  . Status post carotid endarterectomy RIGHT --  1995  . Status post primary angioplasty with coronary stent 2000--  POST INFERIOR MI  . Blood transfusion   . Normal cardiac stress test 2008-- PER DR [place] NOTE 09-07-2009  . Arthritis BACK  . Chronic back pain   . Osteoporosis   . Rosacea LEFT FACIAL RASH  . Idiopathic acute facial nerve palsy LEFT SIDE--  BOTOX THERAPY  . Blepharospasm LEFT EYE  . History of alcohol abuse RECOVERING SINCE 2000  . Anxiety   . Unstable balance WALKS W/ CANE  . HTN (hypertension)   . Vaginal cancer   . Cervical cancer 12/28/11     vagina upper right bx==squamous cell ca  . Emphysema   .  S/P radiation therapy     HDR 5 Fractions to Upper Vaginal Area  . Left renal mass 03/02/12  . Blepharospasm     left  . Seizures     x 1 after abrupt discontinuation of  Clonidine  . PONV (postoperative nausea and vomiting)   . Other and unspecified general anesthetics causing adverse effect in therapeutic use post op delirium--  last anes record w/ chart  from   09-22-2009 (spinal w/ light sedation)  . Coronary artery disease CARDIOLOGIST- DR [place]--- LAST VISIT NOTE 09-07-2009  W/ CHART  . Renal cell carcinoma 07/09/12    Left mass    Family Hx:  Family History  Problem Relation Age of Onset  . Hypertension Mother   . Hypertension Father     Vitals:  Blood pressure 165/100, pulse 60, temperature 98 F (36.7 C), temperature source Oral, resp. rate 18, height 5\' 5"  (1.651 m), weight 154 lb 6.4 oz (70.035 kg).  Physical Exam:  Well-nourished well-developed female in no acute distress.   Neck: Supple, no lymphadenopathy, no thyromegaly.  Lungs: Clear to auscultation bilaterally.  Cardiovascular: Regular rate and rhythm.  Abdomen: Soft, nontender, nondistended. There is no palpable mass or prostatomegaly. Well-healed robotic skin incisions.  Groins: No lymphadenopathy.  Extremities no edema.  Pelvic: Normal external female genitalia. Vagina is markedly atrophic. Vaginal cuff is visualized is no visible lesions. There is evidence of Premarin cream in the vagina. ThinPrep Pap was submitted without difficulty. Bimanual examination reveals no masses or nodularity.   Assessment/Plan: 67 year old with a clinical stage I vaginal carcinoma treated with high-dose rate vaginal brachyherapy. She completed her treatment on February 21, 2012.  We'll followup in results for Pap smear from today. She has an appointment with Dr. Karoline Caldwell in December and will return to see me in March 2015. I refilled her prescription for Premarin  cream. She will also use that on the external genitalia to see  if that helps with her vulvar pruritus.  Allyssia Skluzacek A., MD 09/20/2012, 10:46 AM

## 2012-09-25 ENCOUNTER — Telehealth: Payer: Self-pay | Admitting: Gynecologic Oncology

## 2012-09-25 DIAGNOSIS — M545 Low back pain: Secondary | ICD-10-CM | POA: Diagnosis not present

## 2012-09-25 NOTE — Telephone Encounter (Signed)
Patient called requesting pap smear results.  Informed that pap smear was negative for malignancy or atypia.  No concerns voiced.  Instructed to call for any needs.

## 2012-09-27 ENCOUNTER — Encounter: Payer: Self-pay | Admitting: Gynecologic Oncology

## 2012-10-31 DIAGNOSIS — Z1211 Encounter for screening for malignant neoplasm of colon: Secondary | ICD-10-CM | POA: Diagnosis not present

## 2012-10-31 DIAGNOSIS — M5137 Other intervertebral disc degeneration, lumbosacral region: Secondary | ICD-10-CM | POA: Diagnosis not present

## 2012-10-31 DIAGNOSIS — I10 Essential (primary) hypertension: Secondary | ICD-10-CM | POA: Diagnosis not present

## 2012-10-31 DIAGNOSIS — Z23 Encounter for immunization: Secondary | ICD-10-CM | POA: Diagnosis not present

## 2012-10-31 DIAGNOSIS — F319 Bipolar disorder, unspecified: Secondary | ICD-10-CM | POA: Diagnosis not present

## 2012-10-31 DIAGNOSIS — M25559 Pain in unspecified hip: Secondary | ICD-10-CM | POA: Diagnosis not present

## 2012-10-31 DIAGNOSIS — J449 Chronic obstructive pulmonary disease, unspecified: Secondary | ICD-10-CM | POA: Diagnosis not present

## 2012-11-21 DIAGNOSIS — I10 Essential (primary) hypertension: Secondary | ICD-10-CM | POA: Diagnosis not present

## 2012-11-22 ENCOUNTER — Other Ambulatory Visit: Payer: Self-pay

## 2012-11-22 DIAGNOSIS — M545 Low back pain: Secondary | ICD-10-CM | POA: Diagnosis not present

## 2012-11-29 ENCOUNTER — Ambulatory Visit: Payer: Medicare Other | Admitting: Gynecologic Oncology

## 2012-12-10 DIAGNOSIS — G245 Blepharospasm: Secondary | ICD-10-CM | POA: Diagnosis not present

## 2012-12-18 DIAGNOSIS — M47817 Spondylosis without myelopathy or radiculopathy, lumbosacral region: Secondary | ICD-10-CM | POA: Diagnosis not present

## 2012-12-20 ENCOUNTER — Other Ambulatory Visit (HOSPITAL_COMMUNITY)
Admission: RE | Admit: 2012-12-20 | Discharge: 2012-12-20 | Disposition: A | Discharge status: Home / Self Care | Payer: Medicare Other | Source: Ambulatory Visit | Attending: Gynecologic Oncology | Admitting: Gynecologic Oncology

## 2012-12-20 ENCOUNTER — Encounter: Payer: Self-pay | Admitting: Gynecologic Oncology

## 2012-12-20 ENCOUNTER — Ambulatory Visit: Payer: Medicare Other | Attending: Gynecologic Oncology | Admitting: Gynecologic Oncology

## 2012-12-20 VITALS — BP 154/72 | HR 50 | Temp 97.5°F | Resp 16 | Ht 66.0 in | Wt 154.5 lb

## 2012-12-20 DIAGNOSIS — C649 Malignant neoplasm of unspecified kidney, except renal pelvis: Secondary | ICD-10-CM | POA: Diagnosis not present

## 2012-12-20 DIAGNOSIS — J438 Other emphysema: Secondary | ICD-10-CM | POA: Diagnosis not present

## 2012-12-20 DIAGNOSIS — Z124 Encounter for screening for malignant neoplasm of cervix: Secondary | ICD-10-CM | POA: Diagnosis not present

## 2012-12-20 DIAGNOSIS — N952 Postmenopausal atrophic vaginitis: Secondary | ICD-10-CM | POA: Insufficient documentation

## 2012-12-20 DIAGNOSIS — I739 Peripheral vascular disease, unspecified: Secondary | ICD-10-CM | POA: Insufficient documentation

## 2012-12-20 DIAGNOSIS — Z87891 Personal history of nicotine dependence: Secondary | ICD-10-CM | POA: Insufficient documentation

## 2012-12-20 DIAGNOSIS — I10 Essential (primary) hypertension: Secondary | ICD-10-CM | POA: Diagnosis not present

## 2012-12-20 DIAGNOSIS — F329 Major depressive disorder, single episode, unspecified: Secondary | ICD-10-CM | POA: Insufficient documentation

## 2012-12-20 DIAGNOSIS — I2510 Atherosclerotic heart disease of native coronary artery without angina pectoris: Secondary | ICD-10-CM | POA: Diagnosis not present

## 2012-12-20 DIAGNOSIS — C52 Malignant neoplasm of vagina: Secondary | ICD-10-CM

## 2012-12-20 DIAGNOSIS — Z79899 Other long term (current) drug therapy: Secondary | ICD-10-CM | POA: Diagnosis not present

## 2012-12-20 DIAGNOSIS — F3289 Other specified depressive episodes: Secondary | ICD-10-CM | POA: Insufficient documentation

## 2012-12-20 DIAGNOSIS — F411 Generalized anxiety disorder: Secondary | ICD-10-CM | POA: Insufficient documentation

## 2012-12-20 NOTE — Progress Notes (Signed)
Consult Note: Gyn-Onc  [name] 67 y.o. female  CC:  Chief Complaint  Patient presents with  . Vaginal Cancer    Follow up    HPI: [name] is a very pleasant 74 year old with a 10 year history of abnormal Pap smears. In April 2011 she had a Pap smear revealed atypical squamous cells cannot exclude a high-grade dysplasia. She was worked up and evaluated and subsequently underwent a vaginal hysterectomy in June of 2011. Pathology at that time revealed the cervix with high-grade squamous dysplasia. Focal adenomyosis. In addition, the ectocervical margin of the cervix was positive. She was seen by Dr. Arlyce Dice on October 29 at which time a Pap smear revealed VAIN2/VAIN 3.   She underwent laser ablation on for dysplasia on February 07, 2010. Operative findings included a raised hyperkeratotic lesion in the vagina encompassing the entire vaginal cuff. The area was acetowhite changing. The total area measured approximately 4 x 4 centimeters.  I saw her in June 2013 her Pap smear was normal. She subsequently saw Dr. Arlyce Dice October 30, 13 and her Pap smear returned as atypical squamous cells of undetermined significance. The patient has positive high-risk HPV.  I saw her in late 2013 acetowhite epithelial changes at the top of the vagina. However, there was a punctate area with telangiectasias in the upper right vaginal fornix. A biopsy of this was performed. It revealed a squamous cell carcinoma.   She completed 5 fractions of HDR with Dr. Karoline Caldwell in 2/14. She was ultimately diagnosed with a renal cell carcinoma and is status post a partial left nephrectomy 07/09/2012. The pathology revealed a T1-1 grade 2 clear cell renal cell carcinoma. The margins were negative and she was dispositioned to close followup. She comes in today for followup Pap smear. Last Pap smear performed in May of 2014 revealed atypical squamous cells of undetermined significance. However, she just finished her brachytherapy  on February 4. I last saw her September 2013 at which time her exam and Pap smear were negative. She has been using her vaginal dilators and her vaginal Premarin cream.  Review of Systems  Constitutional: No complaints, weight is stable her appetite is back to baseline Skin: No rash, sores, jaundice, itching, or dryness. + bruise under left eye from injections Cardiovascular: No chest pain, shortness of breath, or edema  Pulmonary: No cough or wheeze.  Gastro Intestinal: No nausea, vomiting, slight constipation, or diarrhea reported. No bright red blood per rectum or change in bowel movement.  Genitourinary: No frequency, urgency, or dysuria.  Denies vaginal bleeding and discharge. External vulvar pruritus has resolved. Using dilators 3x/week. Musculoskeletal: No myalgia, arthralgia, joint swelling or pain.  Neurologic: No changes Psychology: No issues '  Current Meds:  Outpatient Encounter Prescriptions as of 12/20/2012  Medication Sig  . albuterol (PROVENTIL HFA;VENTOLIN HFA) 108 (90 BASE) MCG/ACT inhaler Inhale 2 puffs into the lungs every 6 (six) hours as needed for wheezing.  . ARIPiprazole (ABILIFY) 15 MG tablet Take 15 mg by mouth every morning.   Marland Kitchen aspirin EC 81 MG tablet Take 81 mg by mouth every morning.   . clonazePAM (KLONOPIN) 1 MG tablet Take 1 mg by mouth 3 (three) times daily as needed for anxiety.   . cloNIDine (CATAPRES) 0.2 MG tablet Take 0.2 mg by mouth 2 (two) times daily.   Marland Kitchen conjugated estrogens (PREMARIN) vaginal cream Place 0.5 g vaginally daily before supper.  . docusate sodium (COLACE) 100 MG capsule Take 1 capsule (100 mg total) by  mouth 2 (two) times daily.  Marland Kitchen esomeprazole (NEXIUM) 20 MG capsule Take 20 mg by mouth daily before breakfast.  . furosemide (LASIX) 20 MG tablet Take 20 mg by mouth every morning.  Marland Kitchen HYDROcodone-acetaminophen (NORCO/VICODIN) 5-325 MG per tablet Take 1-2 tablets by mouth every 6 (six) hours as needed for pain.  Marland Kitchen lamoTRIgine (LAMICTAL)  100 MG tablet Take 200 mg by mouth 2 (two) times daily.   . Lubricants (SURGILUBE EX) Place 1 application vaginally daily before supper.   . mometasone-formoterol (DULERA) 100-5 MCG/ACT AERO Inhale 2 puffs into the lungs 2 (two) times daily.  Marland Kitchen olmesartan-hydrochlorothiazide (BENICAR HCT) 40-25 MG per tablet Take 1 tablet by mouth every morning.   Marland Kitchen oxyCODONE-acetaminophen (ROXICET) 5-325 MG per tablet Take 1 tablet by mouth every 4 (four) hours as needed for pain.  . potassium chloride (KLOR-CON) 8 MEQ tablet Take 8 mEq by mouth daily.  . rosuvastatin (CRESTOR) 5 MG tablet Take 5 mg by mouth at bedtime.  . sertraline (ZOLOFT) 50 MG tablet Take 50 mg by mouth every morning.   . tiotropium (SPIRIVA) 18 MCG inhalation capsule Place 18 mcg into inhaler and inhale daily. EVENING    Allergy: No Known Allergies  Social Hx:   History   Social History  . Marital Status: Divorced    Spouse Name: N/A    Number of Children: N/A  . Years of Education: N/A   Occupational History  . Not on file.   Social History Main Topics  . Smoking status: Former Smoker -- 2.00 packs/day for 50 years    Types: Cigarettes    Quit date: 01/18/2011  . Smokeless tobacco: Never Used     Comment: STATES QUIT SMOKING 01-18-2011  . Alcohol Use: No     Comment: RECOVERING ALCOHOLIC--   QUIT IN 2000  . Drug Use: No  . Sexual Activity: No   Other Topics Concern  . Not on file   Social History Narrative  . No narrative on file    Past Surgical Hx:  Past Surgical History  Procedure Laterality Date  . Total knee arthroplasty  09-22-2009    RIGHT  . Vaginal hysterectomy  07-06-2009  . Hemiarthroplasty hip  12-26-2008    LEFT FEMORAL NECK FX  . Total hip arthroplasty  04-15-2008    POST FAILED  RIGHT HIP ORIF FEMORAL FX  . Cervical conization w/bx  09-23-2008  . Orif hip fracture  02-13-2007    RIGHT FEMORAL NECK FX  . Right shoulder surg.  2007  . Coronary angioplasty with stent placement  2000-    INFERIOR MI    X1 STENT TO RCA  . Carotid endarterectomy  1995    RIGHT  . Cataract extraction w/ intraocular lens  implant, bilateral    . Orif right distal radius and right proximal humerous neck fx's  10-10-2005  . Upper right vaginal region  12/28/11    BIOPSY: SQUAMOUS CELL CARCINOMA  . Eus N/A 03/05/2012    Procedure: FULL UPPER ENDOSCOPIC ULTRASOUND (EUS) RADIAL and EGD;  Surgeon: Rachael Fee, MD;  Location: [facility];  Service: Endoscopy;  Laterality: N/A;  ercp scope first than eus scope  . Robotic assited partial nephrectomy Left 07/09/2012    Procedure: ROBOTIC ASSITED PARTIAL NEPHRECTOMY;  Surgeon: Crecencio Mc, MD;  Location: [facility];  Service: Urology;  Laterality: Left;    Past Medical Hx:  Past Medical History  Diagnosis Date  . Hypertension   . Depression   . Alcoholism  recovering since 2000  . Inferior MI 2000--  POST PTCA W/ STENT X1  . Bipolar disorder   . Peripheral vascular disease POST RIGHT CAROTID SURG.  1995  . Status post carotid endarterectomy RIGHT --  1995  . Status post primary angioplasty with coronary stent 2000--  POST INFERIOR MI  . Blood transfusion   . Normal cardiac stress test 2008-- PER DR [place] NOTE 09-07-2009  . Arthritis BACK  . Chronic back pain   . Osteoporosis   . Rosacea LEFT FACIAL RASH  . Idiopathic acute facial nerve palsy LEFT SIDE--  BOTOX THERAPY  . Blepharospasm LEFT EYE  . History of alcohol abuse RECOVERING SINCE 2000  . Anxiety   . Unstable balance WALKS W/ CANE  . HTN (hypertension)   . Vaginal cancer   . Cervical cancer 12/28/11     vagina upper right bx==squamous cell ca  . Emphysema   . S/P radiation therapy     HDR 5 Fractions to Upper Vaginal Area  . Left renal mass 03/02/12  . Blepharospasm     left  . Seizures     x 1 after abrupt discontinuation of  Clonidine  . PONV (postoperative nausea and vomiting)   . Other and unspecified general anesthetics causing adverse effect in therapeutic use post op  delirium--  last anes record w/ chart  from   09-22-2009 (spinal w/ light sedation)  . Coronary artery disease CARDIOLOGIST- DR [place]--- LAST VISIT NOTE 09-07-2009  W/ CHART  . Renal cell carcinoma 07/09/12    Left mass    Family Hx:  Family History  Problem Relation Age of Onset  . Hypertension Mother   . Hypertension Father     Vitals:  Blood pressure 154/72, pulse 50, temperature 97.5 F (36.4 C), temperature source Oral, resp. rate 16, height 5\' 6"  (1.676 m), weight 154 lb 8 oz (70.081 kg).  Physical Exam:  Well-nourished well-developed female in no acute distress.   Neck: Supple, no lymphadenopathy, no thyromegaly.  Abdomen: Soft, nontender, nondistended. There is no palpable mass or prostatomegaly. Well-healed robotic skin incisions.  Groins: No lymphadenopathy.  Extremities no edema.  Pelvic: Normal external female genitalia. Vagina is markedly atrophic. Vaginal cuff is visualized is no visible lesions except for radiation telangectasias. ThinPrep Pap was submitted without difficulty. Bimanual examination reveals no masses or nodularity.   Assessment/Plan: 67 year old with a clinical stage I vaginal carcinoma treated with high-dose rate vaginal brachyherapy. She completed her treatment on February 21, 2012.  We'll followup in results for Pap smear from today. She will see Dr. Karoline Caldwell  in March 2015 and return to see me in 6 months.  Rashonda Warrior A., MD 12/20/2012, 9:28 AM

## 2012-12-20 NOTE — Patient Instructions (Addendum)
I will notify you of the results of your Pap smear. Please return to see GYN oncology in 6 months and Dr. Karoline Caldwell in 3 months.

## 2012-12-25 ENCOUNTER — Telehealth: Payer: Self-pay | Admitting: Gynecologic Oncology

## 2012-12-25 NOTE — Telephone Encounter (Signed)
Patient notified of pap smear results.  No concerns voiced.

## 2012-12-27 ENCOUNTER — Ambulatory Visit (INDEPENDENT_AMBULATORY_CARE_PROVIDER_SITE_OTHER): Payer: Medicare Other | Admitting: Physician Assistant

## 2012-12-27 ENCOUNTER — Encounter: Payer: Self-pay | Admitting: Physician Assistant

## 2012-12-27 ENCOUNTER — Ambulatory Visit: Payer: Medicare Other | Admitting: Cardiology

## 2012-12-27 VITALS — BP 150/90 | HR 50 | Ht 66.5 in | Wt 153.0 lb

## 2012-12-27 DIAGNOSIS — I2510 Atherosclerotic heart disease of native coronary artery without angina pectoris: Secondary | ICD-10-CM | POA: Diagnosis not present

## 2012-12-27 DIAGNOSIS — I779 Disorder of arteries and arterioles, unspecified: Secondary | ICD-10-CM

## 2012-12-27 DIAGNOSIS — I10 Essential (primary) hypertension: Secondary | ICD-10-CM | POA: Diagnosis not present

## 2012-12-27 MED ORDER — AMLODIPINE BESYLATE 2.5 MG PO TABS: 2.5000 mg | ORAL_TABLET | Freq: Every day | ORAL | Status: DC

## 2012-12-27 NOTE — Patient Instructions (Signed)
Your physician has recommended you make the following change in your medication: Start Norvasc ( 2.5 mg) daily   Your physician has requested that you have a carotid duplex. DX:  433.10 This test is an ultrasound of the carotid arteries in your neck. It looks at blood flow through these arteries that supply the brain with blood. Allow one hour for this exam. There are no restrictions or special instructions.  Call if Blood Pressure is higher than 150/90 @ [phone]  Your physician wants you to follow-up in: 6 months with Dr. Thomasene Lot.  You will receive a reminder letter in the mail two months in advance. If you don't receive a letter, please call our office@ [phone] to schedule the follow-up appointment.

## 2012-12-27 NOTE — Progress Notes (Signed)
[address] [place], [place]  [address] Phone: [phone] Fax:  [phone]  Date:  12/27/2012   ID:  [name], DOB 22-Jun-1945, MRN [id number]  PCP:  Cala Bradford, MD  Cardiologist:  Dr. Peter [place]     History of Present Illness: [name] is a 67 y.o. female with a hx of CAD, s/p inferior MI in 2000 treated with stent to the RCA, carotid stenosis s/p R CEA in 1995, renal CA s/p partial L nephrectomy in 06/2012, HTN, HL, COPD, blepharospasm.  LexiScan Myoview (02/2011): No ischemia, mild apical thinning, EF 69%, normal study.  Echocardiogram (06/2012): EF 60-65%, normal wall motion, mild LAE, PASP 38.  Carotid US (03/2012): RICA 40-59%.  She has been doing well.  She denies chest pain or shortness of breath. She is overall NYHA class 2-2b. She denies orthopnea, PND or significant pedal edema.  No syncope.    Recent Labs: 03/07/2012: ALT 20  07/12/2012: Creatinine 0.58; Hemoglobin 9.7*; Potassium 3.2*   Wt Readings from Last 3 Encounters:  12/27/12 153 lb (69.4 kg)  12/20/12 154 lb 8 oz (70.081 kg)  09/20/12 154 lb 6.4 oz (70.035 kg)     Past Medical History  Diagnosis Date  . Hypertension   . Depression   . Alcoholism     recovering since 2000  . Inferior MI 2000--  POST PTCA W/ STENT X1  . Bipolar disorder   . Peripheral vascular disease POST RIGHT CAROTID SURG.  1995  . Status post carotid endarterectomy RIGHT --  1995  . Status post primary angioplasty with coronary stent 2000--  POST INFERIOR MI  . Blood transfusion   . Normal cardiac stress test 2008-- PER DR [place] NOTE 09-07-2009  . Arthritis BACK  . Chronic back pain   . Osteoporosis   . Rosacea LEFT FACIAL RASH  . Idiopathic acute facial nerve palsy LEFT SIDE--  BOTOX THERAPY  . Blepharospasm LEFT EYE  . History of alcohol abuse RECOVERING SINCE 2000  . Anxiety   . Unstable balance WALKS W/ CANE  . HTN (hypertension)   . Vaginal cancer   . Cervical cancer 12/28/11     vagina  upper right bx==squamous cell ca  . Emphysema   . S/P radiation therapy     HDR 5 Fractions to Upper Vaginal Area  . Left renal mass 03/02/12  . Blepharospasm     left  . Seizures     x 1 after abrupt discontinuation of  Clonidine  . PONV (postoperative nausea and vomiting)   . Other and unspecified general anesthetics causing adverse effect in therapeutic use post op delirium--  last anes record w/ chart  from   09-22-2009 (spinal w/ light sedation)  . Coronary artery disease CARDIOLOGIST- DR [place]--- LAST VISIT NOTE 09-07-2009  W/ CHART  . Renal cell carcinoma 07/09/12    Left mass    Current Outpatient Prescriptions  Medication Sig Dispense Refill  . albuterol (PROVENTIL HFA;VENTOLIN HFA) 108 (90 BASE) MCG/ACT inhaler Inhale 2 puffs into the lungs every 6 (six) hours as needed for wheezing.      . ARIPiprazole (ABILIFY) 15 MG tablet Take 15 mg by mouth every morning.       Marland Kitchen aspirin EC 81 MG tablet Take 81 mg by mouth every morning.       . clonazePAM (KLONOPIN) 1 MG tablet Take 1 mg by mouth 3 (three) times daily as needed for anxiety.       Marland Kitchen  cloNIDine (CATAPRES) 0.2 MG tablet Take 0.2 mg by mouth 2 (two) times daily.       Marland Kitchen conjugated estrogens (PREMARIN) vaginal cream Place 0.5 g vaginally daily before supper.      . docusate sodium (COLACE) 100 MG capsule Take 1 capsule (100 mg total) by mouth 2 (two) times daily.  30 capsule  0  . esomeprazole (NEXIUM) 20 MG capsule Take 20 mg by mouth daily before breakfast.      . furosemide (LASIX) 20 MG tablet Take 20 mg by mouth every morning.      Marland Kitchen HYDROcodone-acetaminophen (NORCO/VICODIN) 5-325 MG per tablet Take 1-2 tablets by mouth every 6 (six) hours as needed for pain.  20 tablet  0  . lamoTRIgine (LAMICTAL) 100 MG tablet Take 200 mg by mouth 2 (two) times daily.       . Lubricants (SURGILUBE EX) Place 1 application vaginally daily before supper.       . mometasone-formoterol (DULERA) 100-5 MCG/ACT AERO Inhale 2 puffs into the lungs  2 (two) times daily.  1 Inhaler  3  . olmesartan-hydrochlorothiazide (BENICAR HCT) 40-25 MG per tablet Take 1 tablet by mouth every morning.       . potassium chloride (KLOR-CON) 8 MEQ tablet Take 8 mEq by mouth daily.      . rosuvastatin (CRESTOR) 5 MG tablet Take 5 mg by mouth at bedtime.      . sertraline (ZOLOFT) 50 MG tablet Take 50 mg by mouth every morning.       . tiotropium (SPIRIVA) 18 MCG inhalation capsule Place 18 mcg into inhaler and inhale daily. EVENING       No current facility-administered medications for this visit.    Allergies:   Review of patient's allergies indicates no known allergies.   Social History:  The patient  reports that she quit smoking about 23 months ago. Her smoking use included Cigarettes. She has a 100 pack-year smoking history. She has never used smokeless tobacco. She reports that she does not drink alcohol or use illicit drugs.   Family History:  The patient's family history includes Hypertension in her father and mother.   ROS:  Please see the history of present illness.   All other systems reviewed and negative.   PHYSICAL EXAM: VS:  BP 150/90  Pulse 50  Ht 5' 6.5" (1.689 m)  Wt 153 lb (69.4 kg)  BMI 24.33 kg/m2  SpO2 94% Well nourished, well developed, in no acute distress HEENT: normal Neck: no JVD Cardiac:  normal S1, S2; RRR; 1/6 systolic murmur at RUSB Lungs:  clear to auscultation bilaterally, no wheezing, rhonchi or rales Abd: soft, nontender, no hepatomegaly Ext: no edema Skin: warm and dry Neuro:  CNs 2-12 intact, no focal abnormalities noted  EKG:  Sinus brady, HR 50, normal axis, NSSTTW changes     ASSESSMENT AND PLAN:  1. CAD:  No angina.  Continue ASA and statin.  2. Hypertension:  Uncontrolled.  Start Norvasc 2.5 QD.  Monitor BPs at home.  3. Hyperlipidemia:  Continue statin.  Labs checked by PCP. 4. Carotid Stenosis:  Arrange follow up US in 03/2013. 5. Disposition:  F/u with Dr. Peter [place] in 6 mos.     Signed, Tereso Newcomer, PA-C  12/27/2012 2:03 PM

## 2013-01-30 DIAGNOSIS — M47817 Spondylosis without myelopathy or radiculopathy, lumbosacral region: Secondary | ICD-10-CM | POA: Diagnosis not present

## 2013-01-31 DIAGNOSIS — Z8371 Family history of colonic polyps: Secondary | ICD-10-CM | POA: Diagnosis not present

## 2013-01-31 DIAGNOSIS — E785 Hyperlipidemia, unspecified: Secondary | ICD-10-CM | POA: Diagnosis not present

## 2013-01-31 DIAGNOSIS — F319 Bipolar disorder, unspecified: Secondary | ICD-10-CM | POA: Diagnosis not present

## 2013-01-31 DIAGNOSIS — I10 Essential (primary) hypertension: Secondary | ICD-10-CM | POA: Diagnosis not present

## 2013-02-08 ENCOUNTER — Ambulatory Visit (HOSPITAL_COMMUNITY)
Admission: RE | Admit: 2013-02-08 | Discharge: 2013-02-08 | Disposition: A | Discharge status: Home / Self Care | Payer: Medicare Other | Source: Ambulatory Visit | Attending: Urology | Admitting: Urology

## 2013-02-08 ENCOUNTER — Other Ambulatory Visit (HOSPITAL_COMMUNITY): Payer: Self-pay | Admitting: Urology

## 2013-02-08 DIAGNOSIS — R918 Other nonspecific abnormal finding of lung field: Secondary | ICD-10-CM | POA: Insufficient documentation

## 2013-02-08 DIAGNOSIS — Z85528 Personal history of other malignant neoplasm of kidney: Secondary | ICD-10-CM | POA: Diagnosis not present

## 2013-02-08 DIAGNOSIS — C649 Malignant neoplasm of unspecified kidney, except renal pelvis: Secondary | ICD-10-CM

## 2013-02-08 DIAGNOSIS — I771 Stricture of artery: Secondary | ICD-10-CM | POA: Diagnosis not present

## 2013-02-15 DIAGNOSIS — Z85528 Personal history of other malignant neoplasm of kidney: Secondary | ICD-10-CM | POA: Diagnosis not present

## 2013-02-22 ENCOUNTER — Ambulatory Visit: Payer: Medicare Other | Admitting: Radiation Oncology

## 2013-02-22 DIAGNOSIS — M47817 Spondylosis without myelopathy or radiculopathy, lumbosacral region: Secondary | ICD-10-CM | POA: Diagnosis not present

## 2013-02-22 DIAGNOSIS — M545 Low back pain, unspecified: Secondary | ICD-10-CM | POA: Diagnosis not present

## 2013-03-16 DIAGNOSIS — M5137 Other intervertebral disc degeneration, lumbosacral region: Secondary | ICD-10-CM | POA: Diagnosis not present

## 2013-03-16 DIAGNOSIS — G894 Chronic pain syndrome: Secondary | ICD-10-CM | POA: Diagnosis not present

## 2013-03-16 DIAGNOSIS — M47817 Spondylosis without myelopathy or radiculopathy, lumbosacral region: Secondary | ICD-10-CM | POA: Diagnosis not present

## 2013-03-18 DIAGNOSIS — G245 Blepharospasm: Secondary | ICD-10-CM | POA: Diagnosis not present

## 2013-03-21 ENCOUNTER — Encounter: Payer: Self-pay | Admitting: Radiation Oncology

## 2013-03-22 ENCOUNTER — Encounter: Payer: Self-pay | Admitting: Radiation Oncology

## 2013-03-22 ENCOUNTER — Ambulatory Visit
Admission: RE | Admit: 2013-03-22 | Discharge: 2013-03-22 | Disposition: A | Discharge status: Home / Self Care | Payer: Medicare Other | Source: Ambulatory Visit | Attending: Radiation Oncology | Admitting: Radiation Oncology

## 2013-03-22 VITALS — BP 169/84 | HR 64 | Temp 98.2°F | Ht 66.5 in | Wt 165.7 lb

## 2013-03-22 DIAGNOSIS — C52 Malignant neoplasm of vagina: Secondary | ICD-10-CM | POA: Diagnosis not present

## 2013-03-22 NOTE — Progress Notes (Signed)
[name] is here today s/p radiation for cervical cancer which completed 02/21/12.  She denies any vaginal bleeding/discharge nor pain.  Her last PAP smear from 12/20/12 showed negative for intraepithelial lesions or malignancy.  Denies any other concerns today.

## 2013-03-22 NOTE — Progress Notes (Signed)
Radiation Oncology         [phone] ________________________________  Name: [name] MRN: [id number]  Date: 03/22/2013  DOB: [birth year]  Follow-Up Visit Note  outpatient  CC: Vidal Schwalbe, MD  Sherol Dade., MD  Diagnosis and Prior Radiotherapy:   Squamous cell carcinoma at the vaginal cuff - recurrent cervical cancer presenting as stage I vaginal cancer  She is status post 38.75 Gray in 5 fractions, high dose rate vaginal cuff brachytherapy, 5.5 cm length of proximal vagina treated. She completed treatment on 02/21/2012  Narrative:  The patient returns today for routine follow-up.  She denies any vaginal bleeding/discharge nor pain or itching.  Using the dilator, and using the vaginal creams rec'd by Dr Alycia Rossetti.Marland Kitchen Her last PAP smear from 12/20/12 showed negative for intraepithelial lesions or malignancy. Denies any other concerns today  Pap smear on 12-20-12 showed: NEGATIVE FOR INTRAEPITHELIAL LESIONS OR MALIGNANCY. BENIGN REACTIVE CHANGES INCLUDING PARAKERATOSIS.  She sees Dr Alycia Rossetti again in June                          ALLERGIES:  has No Known Allergies.  Meds: Current Outpatient Prescriptions  Medication Sig Dispense Refill  . albuterol (PROVENTIL HFA;VENTOLIN HFA) 108 (90 BASE) MCG/ACT inhaler Inhale 2 puffs into the lungs every 6 (six) hours as needed for wheezing.      Marland Kitchen amLODipine (NORVASC) 2.5 MG tablet Take 1 tablet (2.5 mg total) by mouth daily.  30 tablet  11  . ARIPiprazole (ABILIFY) 15 MG tablet Take 15 mg by mouth every morning.       Marland Kitchen aspirin EC 81 MG tablet Take 81 mg by mouth every morning.       . clonazePAM (KLONOPIN) 1 MG tablet Take 1 mg by mouth 3 (three) times daily as needed for anxiety.       . cloNIDine (CATAPRES) 0.2 MG tablet Take 0.2 mg by mouth 2 (two) times daily.       Marland Kitchen conjugated estrogens (PREMARIN) vaginal cream Place 0.5 g vaginally daily before supper.      . docusate sodium (COLACE) 100 MG capsule Take 1 capsule (100 mg total)  by mouth 2 (two) times daily.  30 capsule  0  . esomeprazole (NEXIUM) 20 MG capsule Take 20 mg by mouth daily before breakfast.      . furosemide (LASIX) 20 MG tablet Take 20 mg by mouth every morning.      Marland Kitchen HYDROcodone-acetaminophen (NORCO) 10-325 MG per tablet Take 1 tablet by mouth every 6 (six) hours as needed. 1-2 tabs by mouth every 6 hours as needed for pain.  Replaced 5-325      . lamoTRIgine (LAMICTAL) 100 MG tablet Take 200 mg by mouth 2 (two) times daily.       . Lubricants (SURGILUBE EX) Place 1 application vaginally daily before supper.       . mometasone-formoterol (DULERA) 100-5 MCG/ACT AERO Inhale 2 puffs into the lungs 2 (two) times daily.  1 Inhaler  3  . olmesartan-hydrochlorothiazide (BENICAR HCT) 40-25 MG per tablet Take 1 tablet by mouth every morning.       . potassium chloride (KLOR-CON) 8 MEQ tablet Take 8 mEq by mouth daily.      . rosuvastatin (CRESTOR) 5 MG tablet Take 5 mg by mouth at bedtime.      . sertraline (ZOLOFT) 50 MG tablet Take 50 mg by mouth every morning.       Marland Kitchen  tiotropium (SPIRIVA) 18 MCG inhalation capsule Place 18 mcg into inhaler and inhale daily. EVENING       No current facility-administered medications for this encounter.    Physical Findings: The patient is in no acute distress. Patient is alert and oriented.  height is 5' 6.5" (1.689 m) and weight is 165 lb 11.2 oz (75.161 kg). Her temperature is 98.2 F (36.8 C). Her blood pressure is 169/84 and her pulse is 64. Marland Kitchen    No inguinal adenopathy palpated.  No vaginal bleeding or lesions appreciated on digital and speculum exam. No fistulas appreciated on digital exam of vagina/rectum.   Lab Findings: Lab Results  Component Value Date   WBC 5.6 07/02/2012   HGB 9.7* 07/12/2012   HCT 28.7* 07/12/2012   MCV 87.3 07/02/2012   PLT 323 07/02/2012    Radiographic Findings: No results found.  Impression/Plan:  Doing well.  I will see her back in 6 months. She sees Dr Alycia Rossetti in June.  She knows to  call if any concerns arise in the interim. _____________________________________   Eppie Gibson, MD

## 2013-03-25 ENCOUNTER — Ambulatory Visit (HOSPITAL_COMMUNITY): Payer: Medicare Other | Attending: Cardiology | Admitting: Cardiology

## 2013-03-25 DIAGNOSIS — I739 Peripheral vascular disease, unspecified: Secondary | ICD-10-CM

## 2013-03-25 DIAGNOSIS — I6529 Occlusion and stenosis of unspecified carotid artery: Secondary | ICD-10-CM

## 2013-03-25 DIAGNOSIS — I779 Disorder of arteries and arterioles, unspecified: Secondary | ICD-10-CM

## 2013-03-25 NOTE — Progress Notes (Signed)
Carotid Duplex Complete 

## 2013-04-02 ENCOUNTER — Other Ambulatory Visit: Payer: Self-pay

## 2013-04-02 DIAGNOSIS — I739 Peripheral vascular disease, unspecified: Principal | ICD-10-CM

## 2013-04-02 DIAGNOSIS — I779 Disorder of arteries and arterioles, unspecified: Secondary | ICD-10-CM

## 2013-04-12 DIAGNOSIS — G894 Chronic pain syndrome: Secondary | ICD-10-CM | POA: Diagnosis not present

## 2013-04-12 DIAGNOSIS — M47817 Spondylosis without myelopathy or radiculopathy, lumbosacral region: Secondary | ICD-10-CM | POA: Diagnosis not present

## 2013-04-12 DIAGNOSIS — Z79899 Other long term (current) drug therapy: Secondary | ICD-10-CM | POA: Diagnosis not present

## 2013-04-12 DIAGNOSIS — M5137 Other intervertebral disc degeneration, lumbosacral region: Secondary | ICD-10-CM | POA: Diagnosis not present

## 2013-04-29 DIAGNOSIS — F39 Unspecified mood [affective] disorder: Secondary | ICD-10-CM | POA: Diagnosis not present

## 2013-04-29 DIAGNOSIS — J449 Chronic obstructive pulmonary disease, unspecified: Secondary | ICD-10-CM | POA: Diagnosis not present

## 2013-04-29 DIAGNOSIS — I10 Essential (primary) hypertension: Secondary | ICD-10-CM | POA: Diagnosis not present

## 2013-04-29 DIAGNOSIS — E785 Hyperlipidemia, unspecified: Secondary | ICD-10-CM | POA: Diagnosis not present

## 2013-05-14 ENCOUNTER — Other Ambulatory Visit: Payer: Self-pay

## 2013-05-14 DIAGNOSIS — M545 Low back pain, unspecified: Secondary | ICD-10-CM | POA: Diagnosis not present

## 2013-05-14 DIAGNOSIS — Z1231 Encounter for screening mammogram for malignant neoplasm of breast: Secondary | ICD-10-CM

## 2013-06-11 ENCOUNTER — Ambulatory Visit
Admission: RE | Admit: 2013-06-11 | Discharge: 2013-06-11 | Disposition: A | Discharge status: Home / Self Care | Payer: Medicare Other | Source: Ambulatory Visit

## 2013-06-11 DIAGNOSIS — Z1231 Encounter for screening mammogram for malignant neoplasm of breast: Secondary | ICD-10-CM

## 2013-06-14 DIAGNOSIS — K5900 Constipation, unspecified: Secondary | ICD-10-CM | POA: Diagnosis not present

## 2013-06-26 DIAGNOSIS — M25559 Pain in unspecified hip: Secondary | ICD-10-CM | POA: Diagnosis not present

## 2013-07-04 ENCOUNTER — Other Ambulatory Visit (HOSPITAL_COMMUNITY)
Admission: RE | Admit: 2013-07-04 | Discharge: 2013-07-04 | Disposition: A | Discharge status: Home / Self Care | Payer: Medicare Other | Source: Ambulatory Visit | Attending: Gynecologic Oncology | Admitting: Gynecologic Oncology

## 2013-07-04 ENCOUNTER — Encounter: Payer: Self-pay | Admitting: Gynecologic Oncology

## 2013-07-04 ENCOUNTER — Ambulatory Visit: Payer: Medicare Other | Attending: Gynecologic Oncology | Admitting: Gynecologic Oncology

## 2013-07-04 VITALS — BP 139/76 | HR 63 | Temp 97.4°F | Resp 18 | Ht 66.0 in | Wt 162.9 lb

## 2013-07-04 DIAGNOSIS — Z01419 Encounter for gynecological examination (general) (routine) without abnormal findings: Secondary | ICD-10-CM | POA: Diagnosis not present

## 2013-07-04 DIAGNOSIS — Z90710 Acquired absence of both cervix and uterus: Secondary | ICD-10-CM | POA: Diagnosis not present

## 2013-07-04 DIAGNOSIS — B977 Papillomavirus as the cause of diseases classified elsewhere: Secondary | ICD-10-CM | POA: Insufficient documentation

## 2013-07-04 DIAGNOSIS — Z85528 Personal history of other malignant neoplasm of kidney: Secondary | ICD-10-CM | POA: Insufficient documentation

## 2013-07-04 DIAGNOSIS — Z923 Personal history of irradiation: Secondary | ICD-10-CM | POA: Diagnosis not present

## 2013-07-04 DIAGNOSIS — Z905 Acquired absence of kidney: Secondary | ICD-10-CM | POA: Diagnosis not present

## 2013-07-04 DIAGNOSIS — Z8544 Personal history of malignant neoplasm of other female genital organs: Secondary | ICD-10-CM | POA: Diagnosis not present

## 2013-07-04 DIAGNOSIS — C52 Malignant neoplasm of vagina: Secondary | ICD-10-CM

## 2013-07-04 DIAGNOSIS — N952 Postmenopausal atrophic vaginitis: Secondary | ICD-10-CM | POA: Diagnosis not present

## 2013-07-04 NOTE — Progress Notes (Signed)
Consult Note: Gyn-Onc  [name] 68 y.o. female  CC:  Chief Complaint  Patient presents with  . Vulvar Cancer    Follow up    HPI: [name] is a very pleasant 89 year old with a 10 year history of abnormal Pap smears. In April 2011 she had a Pap smear revealed atypical squamous cells cannot exclude a high-grade dysplasia. She was worked up and evaluated and subsequently underwent a vaginal hysterectomy in June of 2011. Pathology at that time revealed the cervix with high-grade squamous dysplasia. Focal adenomyosis. In addition, the ectocervical margin of the cervix was positive. She was seen by Dr. Deatra Ina on October 29 at which time a Pap smear revealed VAIN2/VAIN 3.   She underwent laser ablation on for dysplasia on February 07, 2010. Operative findings included a raised hyperkeratotic lesion in the vagina encompassing the entire vaginal cuff. The area was acetowhite changing. The total area measured approximately 4 x 4 centimeters.  I saw her in 2013 and her Pap smear was normal. She subsequently saw Dr. Deatra Ina October 30, 13 and her Pap smear returned as atypical squamous cells of undetermined significance. The patient has positive high-risk HPV.  I saw her in late 2013 acetowhite epithelial changes at the top of the vagina. However, there was a punctate area with telangiectasias in the upper right vaginal fornix. A biopsy of this was performed. It revealed a squamous cell carcinoma.   She completed 5 fractions of HDR with Dr. Lanell Persons in 2/14. She was ultimately diagnosed with a renal cell carcinoma and is status post a partial left nephrectomy 07/09/2012. The pathology revealed a T1-1 grade 2 clear cell renal cell carcinoma. The margins were negative and she was dispositioned to close followup. She comes in today for followup Pap smear. Last Pap smear performed in May of 2014 revealed atypical squamous cells of undetermined significance. However, she just finished her brachytherapy  on February 4. I last saw her 12/14 at which time her exam and Pap smear were negative. She has been using her vaginal dilators and her vaginal Premarin cream. She saw Dr. Lanell Persons in March of 2015 and had a negative exam. She comes in today for followup. She continues to have her constipation. She's taking Colace twice a day and MiraLAX every day. With this regimen she's able to have a bowel movement every day. Without the MiraLAX she has daily bowel movements but they're very firm.  Review of Systems  Constitutional: No complaints, weight is stable Skin: No rash, sores, jaundice, itching, or dryness. + bruise bilateral hands Cardiovascular: No chest pain, + shortness of breath with activity outside particularly with the heat and humidity, or edema  Pulmonary: No cough or wheeze.  Gastro Intestinal: No nausea, vomiting, slight constipation, or diarrhea reported. No bright red blood per rectum or change in bowel movement.  Genitourinary: No frequency, urgency, or dysuria.  Denies vaginal bleeding and discharge.  Using dilators 3x/week. Musculoskeletal: + Back pain which radiates to her legs right greater than left. She is receiving injections with some benefit.  Neurologic: No changes Psychology: No issues   Current Meds:  Outpatient Encounter Prescriptions as of 07/04/2013  Medication Sig  . albuterol (PROVENTIL HFA;VENTOLIN HFA) 108 (90 BASE) MCG/ACT inhaler Inhale 2 puffs into the lungs every 6 (six) hours as needed for wheezing.  Marland Kitchen amLODipine (NORVASC) 2.5 MG tablet Take 1 tablet (2.5 mg total) by mouth daily.  . ARIPiprazole (ABILIFY) 15 MG tablet Take 15 mg by mouth every morning.   Marland Kitchen  aspirin EC 81 MG tablet Take 81 mg by mouth every morning.   . clonazePAM (KLONOPIN) 1 MG tablet Take 1 mg by mouth 3 (three) times daily as needed for anxiety.   . cloNIDine (CATAPRES) 0.2 MG tablet Take 0.2 mg by mouth 2 (two) times daily.   Marland Kitchen docusate sodium (COLACE) 100 MG capsule Take 1 capsule (100 mg  total) by mouth 2 (two) times daily.  Marland Kitchen esomeprazole (NEXIUM) 20 MG capsule Take 20 mg by mouth daily before breakfast.  . furosemide (LASIX) 20 MG tablet Take 20 mg by mouth every morning.  Marland Kitchen HYDROcodone-acetaminophen (NORCO) 10-325 MG per tablet Take 1 tablet by mouth every 6 (six) hours as needed. 1-2 tabs by mouth every 6 hours as needed for pain.  Replaced 5-325  . lamoTRIgine (LAMICTAL) 100 MG tablet Take 200 mg by mouth 2 (two) times daily.   . Lubricants (SURGILUBE EX) Place 1 application vaginally daily before supper.   . mometasone-formoterol (DULERA) 100-5 MCG/ACT AERO Inhale 2 puffs into the lungs 2 (two) times daily.  Marland Kitchen olmesartan-hydrochlorothiazide (BENICAR HCT) 40-25 MG per tablet Take 1 tablet by mouth every morning.   . potassium chloride (KLOR-CON) 8 MEQ tablet Take 8 mEq by mouth daily.  . rosuvastatin (CRESTOR) 5 MG tablet Take 5 mg by mouth at bedtime.  . sertraline (ZOLOFT) 50 MG tablet Take 50 mg by mouth every morning.   . tiotropium (SPIRIVA) 18 MCG inhalation capsule Place 18 mcg into inhaler and inhale daily. EVENING    Allergy: No Known Allergies  Social Hx:   History   Social History  . Marital Status: Divorced    Spouse Name: N/A    Number of Children: N/A  . Years of Education: N/A   Occupational History  . Not on file.   Social History Main Topics  . Smoking status: Former Smoker -- 2.00 packs/day for 50 years    Types: Cigarettes    Quit date: 01/18/2011  . Smokeless tobacco: Never Used     Comment: STATES QUIT SMOKING 01-18-2011  . Alcohol Use: No     Comment: RECOVERING ALCOHOLIC--   QUIT IN 3762  . Drug Use: No  . Sexual Activity: No   Other Topics Concern  . Not on file   Social History Narrative  . No narrative on file    Past Surgical Hx:  Past Surgical History  Procedure Laterality Date  . Total knee arthroplasty  09-22-2009    RIGHT  . Vaginal hysterectomy  07-06-2009  . Hemiarthroplasty hip  12-26-2008    LEFT FEMORAL  NECK FX  . Total hip arthroplasty  04-15-2008    POST FAILED  RIGHT HIP ORIF FEMORAL FX  . Cervical conization w/bx  09-23-2008  . Orif hip fracture  02-13-2007    RIGHT FEMORAL NECK FX  . Right shoulder surg.  2007  . Coronary angioplasty with stent placement  2000-   INFERIOR MI    X1 STENT TO RCA  . Carotid endarterectomy  1995    RIGHT  . Cataract extraction w/ intraocular lens  implant, bilateral    . Orif right distal radius and right proximal humerous neck fx's  10-10-2005  . Upper right vaginal region  12/28/11    BIOPSY: SQUAMOUS CELL CARCINOMA  . Eus N/A 03/05/2012    Procedure: FULL UPPER ENDOSCOPIC ULTRASOUND (EUS) RADIAL and EGD;  Surgeon: Milus Banister, MD;  Location: [facility];  Service: Endoscopy;  Laterality: N/A;  ercp scope first than  eus scope  . Robotic assited partial nephrectomy Left 07/09/2012    Procedure: ROBOTIC ASSITED PARTIAL NEPHRECTOMY;  Surgeon: Dutch Gray, MD;  Location: [facility];  Service: Urology;  Laterality: Left;    Past Medical Hx:  Past Medical History  Diagnosis Date  . Hypertension   . Depression   . Alcoholism     recovering since 2000  . Inferior MI 2000--  POST PTCA W/ STENT X1  . Bipolar disorder   . Peripheral vascular disease POST RIGHT CAROTID SURG.  1995  . Status post carotid endarterectomy RIGHT --  1995  . Status post primary angioplasty with coronary stent 2000--  POST INFERIOR MI  . Blood transfusion   . Normal cardiac stress test 2008-- PER DR Martinique NOTE 09-07-2009  . Arthritis BACK  . Chronic back pain   . Osteoporosis   . Rosacea LEFT FACIAL RASH  . Idiopathic acute facial nerve palsy LEFT SIDE--  BOTOX THERAPY  . Blepharospasm LEFT EYE  . History of alcohol abuse RECOVERING SINCE 2000  . Anxiety   . Unstable balance WALKS W/ CANE  . HTN (hypertension)   . Vaginal cancer   . Cervical cancer 12/28/11     vagina upper right bx==squamous cell ca  . Emphysema   . S/P radiation therapy 02/21/2012    HDR 5 Fractions  to Upper Vaginal Area  . Left renal mass 03/02/12  . Blepharospasm     left  . Seizures     x 1 after abrupt discontinuation of  Clonidine  . PONV (postoperative nausea and vomiting)   . Other and unspecified general anesthetics causing adverse effect in therapeutic use post op delirium--  last anes record w/ chart  from   09-22-2009 (spinal w/ light sedation)  . Coronary artery disease CARDIOLOGIST- DR Martinique--- LAST VISIT NOTE 09-07-2009  W/ CHART  . Renal cell carcinoma 07/09/12    Left mass    Family Hx:  Family History  Problem Relation Age of Onset  . Hypertension Mother   . Hypertension Father     Vitals:  Blood pressure 139/76, pulse 63, temperature 97.4 F (36.3 C), temperature source Oral, resp. rate 18, height 5\' 6"  (1.676 m), weight 162 lb 14.4 oz (73.891 kg).  Physical Exam:  Well-nourished well-developed female in no acute distress.   Neck: Supple, no lymphadenopathy, no thyromegaly.  Abdomen: Soft, nontender, nondistended. There is no palpable mass or prostatomegaly. Well-healed robotic skin incisions.  Groins: No lymphadenopathy.  Extremities no edema.  Pelvic: Normal external female genitalia. Vagina is markedly atrophic. Vaginal cuff is visualized is no visible lesions except for radiation telangectasias. ThinPrep Pap was submitted without difficulty. Bimanual examination reveals no masses or nodularity. Rectal confirms.  Assessment/Plan: 68 year old with a clinical stage I vaginal carcinoma treated with high-dose rate vaginal brachyherapy. She completed her treatment on February 21, 2012.  We'll followup in results for Pap smear from today. She will see Dr. Lanell Persons  in March 2015 and return to see me in 6 months.  GEHRIG,PAOLA A., MD 07/04/2013, 11:54 AM

## 2013-07-04 NOTE — Patient Instructions (Signed)
We will notify you of the results of your Pap smear. Please return to see Dr. Isidore Moos in 3 months and Dr. Alycia Rossetti in 6 months.

## 2013-07-09 LAB — CYTOLOGY - PAP

## 2013-07-11 DIAGNOSIS — M533 Sacrococcygeal disorders, not elsewhere classified: Secondary | ICD-10-CM | POA: Diagnosis not present

## 2013-07-11 DIAGNOSIS — Z79899 Other long term (current) drug therapy: Secondary | ICD-10-CM | POA: Diagnosis not present

## 2013-07-11 DIAGNOSIS — M5137 Other intervertebral disc degeneration, lumbosacral region: Secondary | ICD-10-CM | POA: Diagnosis not present

## 2013-07-16 DIAGNOSIS — G518 Other disorders of facial nerve: Secondary | ICD-10-CM | POA: Diagnosis not present

## 2013-07-25 ENCOUNTER — Encounter: Payer: Self-pay | Admitting: Internal Medicine

## 2013-07-31 DIAGNOSIS — M5137 Other intervertebral disc degeneration, lumbosacral region: Secondary | ICD-10-CM | POA: Diagnosis not present

## 2013-08-05 DIAGNOSIS — M5137 Other intervertebral disc degeneration, lumbosacral region: Secondary | ICD-10-CM | POA: Diagnosis not present

## 2013-08-05 DIAGNOSIS — I10 Essential (primary) hypertension: Secondary | ICD-10-CM | POA: Diagnosis not present

## 2013-08-05 DIAGNOSIS — F39 Unspecified mood [affective] disorder: Secondary | ICD-10-CM | POA: Diagnosis not present

## 2013-08-05 DIAGNOSIS — E785 Hyperlipidemia, unspecified: Secondary | ICD-10-CM | POA: Diagnosis not present

## 2013-08-08 DIAGNOSIS — M5137 Other intervertebral disc degeneration, lumbosacral region: Secondary | ICD-10-CM | POA: Diagnosis not present

## 2013-08-08 DIAGNOSIS — G894 Chronic pain syndrome: Secondary | ICD-10-CM | POA: Diagnosis not present

## 2013-08-13 ENCOUNTER — Other Ambulatory Visit (HOSPITAL_COMMUNITY): Payer: Self-pay | Admitting: Urology

## 2013-08-13 ENCOUNTER — Ambulatory Visit (HOSPITAL_COMMUNITY)
Admission: RE | Admit: 2013-08-13 | Discharge: 2013-08-13 | Disposition: A | Discharge status: Home / Self Care | Payer: Medicare Other | Source: Ambulatory Visit | Attending: Urology | Admitting: Urology

## 2013-08-13 DIAGNOSIS — I517 Cardiomegaly: Secondary | ICD-10-CM | POA: Insufficient documentation

## 2013-08-13 DIAGNOSIS — M899 Disorder of bone, unspecified: Secondary | ICD-10-CM | POA: Diagnosis not present

## 2013-08-13 DIAGNOSIS — D4959 Neoplasm of unspecified behavior of other genitourinary organ: Secondary | ICD-10-CM | POA: Insufficient documentation

## 2013-08-13 DIAGNOSIS — C649 Malignant neoplasm of unspecified kidney, except renal pelvis: Secondary | ICD-10-CM

## 2013-08-13 DIAGNOSIS — M949 Disorder of cartilage, unspecified: Secondary | ICD-10-CM | POA: Diagnosis not present

## 2013-08-13 DIAGNOSIS — M47814 Spondylosis without myelopathy or radiculopathy, thoracic region: Secondary | ICD-10-CM | POA: Diagnosis not present

## 2013-08-20 DIAGNOSIS — C649 Malignant neoplasm of unspecified kidney, except renal pelvis: Secondary | ICD-10-CM | POA: Diagnosis not present

## 2013-09-02 DIAGNOSIS — G894 Chronic pain syndrome: Secondary | ICD-10-CM | POA: Diagnosis not present

## 2013-09-02 DIAGNOSIS — M5137 Other intervertebral disc degeneration, lumbosacral region: Secondary | ICD-10-CM | POA: Diagnosis not present

## 2013-09-02 DIAGNOSIS — Z79899 Other long term (current) drug therapy: Secondary | ICD-10-CM | POA: Diagnosis not present

## 2013-09-20 ENCOUNTER — Ambulatory Visit
Admission: RE | Admit: 2013-09-20 | Discharge: 2013-09-20 | Disposition: A | Discharge status: Home / Self Care | Payer: Medicare Other | Source: Ambulatory Visit | Attending: Radiation Oncology | Admitting: Radiation Oncology

## 2013-09-20 ENCOUNTER — Encounter: Payer: Self-pay | Admitting: Radiation Oncology

## 2013-09-20 VITALS — BP 102/64 | HR 59 | Temp 97.4°F | Ht 66.0 in | Wt 163.8 lb

## 2013-09-20 DIAGNOSIS — C52 Malignant neoplasm of vagina: Secondary | ICD-10-CM | POA: Diagnosis not present

## 2013-09-20 NOTE — Progress Notes (Signed)
Ms. Simone here for reassessment s/p radiation to her pelvic area for recurrent cervical cancer.  She completed on 02/21/12.  Seen by Dr. Alycia Rossetti in 07/04/13 and had a Pap smear.  She denies any vagginal pain, nor dysuria today.   Today she c/o in her pain left,posterior,rib region today for a degenerative disc and will soon be evaluated again.

## 2013-09-20 NOTE — Progress Notes (Signed)
Radiation Oncology         ([phone]) [phone] ________________________________  Name: [name] MRN: [id number]  Date: 09/20/2013  DOB: Jan 22, 1945  Follow-Up Visit Note  outpatient  CC: Vidal Schwalbe, MD  Sherol Dade., MD  Diagnosis and Prior Radiotherapy:   Squamous cell carcinoma at the vaginal cuff - recurrent cervical cancer presenting as stage I vaginal cancer  She is status post 38.75 Gray in 5 fractions, high dose rate vaginal cuff brachytherapy, 5.5 cm length of proximal vagina treated. She completed treatment on 02/21/2012  Narrative:  The patient returns today for routine follow-up.     Seen by Dr. Alycia Rossetti in 07/04/13 and had a Pap smear, NEGATIVE FOR INTRAEPITHELIAL LESIONS OR MALIGNANCY. She denies any vaginal pain, nor dysuria today. Her only GI complaint is constipation.  Today she c/o in her pain left, posterior, rib region today for a degenerative disc and will soon be evaluated again.                            ALLERGIES:  has No Known Allergies.  Meds: Current Outpatient Prescriptions  Medication Sig Dispense Refill  . albuterol (PROVENTIL HFA;VENTOLIN HFA) 108 (90 BASE) MCG/ACT inhaler Inhale 2 puffs into the lungs every 6 (six) hours as needed for wheezing.      Marland Kitchen amLODipine (NORVASC) 2.5 MG tablet Take 1 tablet (2.5 mg total) by mouth daily.  30 tablet  11  . ARIPiprazole (ABILIFY) 15 MG tablet Take 15 mg by mouth every morning.       Marland Kitchen aspirin EC 81 MG tablet Take 81 mg by mouth every morning.       . clonazePAM (KLONOPIN) 1 MG tablet Take 1 mg by mouth 3 (three) times daily as needed for anxiety.       . cloNIDine (CATAPRES) 0.2 MG tablet Take 0.2 mg by mouth 2 (two) times daily.       Marland Kitchen docusate sodium (COLACE) 100 MG capsule Take 1 capsule (100 mg total) by mouth 2 (two) times daily.  30 capsule  0  . esomeprazole (NEXIUM) 20 MG capsule Take 20 mg by mouth daily before breakfast.      . furosemide (LASIX) 20 MG tablet Take 20 mg by mouth every morning.       . lamoTRIgine (LAMICTAL) 100 MG tablet Take 200 mg by mouth 2 (two) times daily.       . Lubricants (SURGILUBE EX) Place 1 application vaginally daily before supper.       . mometasone-formoterol (DULERA) 100-5 MCG/ACT AERO Inhale 2 puffs into the lungs 2 (two) times daily.  1 Inhaler  3  . olmesartan-hydrochlorothiazide (BENICAR HCT) 40-25 MG per tablet Take 1 tablet by mouth every morning.       Marland Kitchen oxyCODONE-acetaminophen (PERCOCET) 10-325 MG per tablet Take 1 tablet by mouth every 6 (six) hours as needed for pain.      . potassium chloride (KLOR-CON) 8 MEQ tablet Take 8 mEq by mouth daily.      . rosuvastatin (CRESTOR) 5 MG tablet Take 5 mg by mouth at bedtime.      . sertraline (ZOLOFT) 50 MG tablet Take 50 mg by mouth every morning.       . tiotropium (SPIRIVA) 18 MCG inhalation capsule Place 18 mcg into inhaler and inhale daily. EVENING       No current facility-administered medications for this encounter.    Physical Findings: The patient is  in no acute distress. Patient is alert and oriented.  height is 5\' 6"  (1.676 m) and weight is 163 lb 12.8 oz (74.299 kg). Her temperature is 97.4 F (36.3 C). Her blood pressure is 102/64 and her pulse is 59. Marland Kitchen   No lymphadenopathy palpated in groin. No external genitalia lesion. Vaginal vault on digital and speculum exam negative for fistulas or signs of cancer.  Telangiectasias of mucosa c/w past brachytherapy.   Lab Findings: Lab Results  Component Value Date   WBC 5.6 07/02/2012   HGB 9.7* 07/12/2012   HCT 28.7* 07/12/2012   MCV 87.3 07/02/2012   PLT 323 07/02/2012    Radiographic Findings: No results found.  Impression/Plan: Doing well. Continue using vaginal dilator.  Stool softeners and laxatives prn constipation. I will see her back in 6 months. She sees Dr Alycia Rossetti in about 3 months. She knows to call if any concerns arise in the interim.      Eppie Gibson, MD

## 2013-09-30 DIAGNOSIS — G894 Chronic pain syndrome: Secondary | ICD-10-CM | POA: Diagnosis not present

## 2013-10-01 DIAGNOSIS — K219 Gastro-esophageal reflux disease without esophagitis: Secondary | ICD-10-CM | POA: Diagnosis not present

## 2013-10-01 DIAGNOSIS — Z23 Encounter for immunization: Secondary | ICD-10-CM | POA: Diagnosis not present

## 2013-10-01 DIAGNOSIS — R1031 Right lower quadrant pain: Secondary | ICD-10-CM | POA: Diagnosis not present

## 2013-10-02 DIAGNOSIS — H04129 Dry eye syndrome of unspecified lacrimal gland: Secondary | ICD-10-CM | POA: Diagnosis not present

## 2013-10-10 ENCOUNTER — Telehealth: Payer: Self-pay | Admitting: Gastroenterology

## 2013-10-10 NOTE — Telephone Encounter (Signed)
Spoke with patient and she does not have a preference for new GI MD. States she has had right lower abdominal pain for last 3-4 months. She saw Dr. Harlan Stains her PCP and was told to see her GI. Scheduled with Nicoletta Ba, PA on 10/17/13 at 2:30 PM.

## 2013-10-15 DIAGNOSIS — M5137 Other intervertebral disc degeneration, lumbosacral region: Secondary | ICD-10-CM | POA: Diagnosis not present

## 2013-10-15 DIAGNOSIS — Z79899 Other long term (current) drug therapy: Secondary | ICD-10-CM | POA: Diagnosis not present

## 2013-10-15 DIAGNOSIS — G894 Chronic pain syndrome: Secondary | ICD-10-CM | POA: Diagnosis not present

## 2013-10-17 ENCOUNTER — Ambulatory Visit: Payer: Medicare Other | Admitting: Physician Assistant

## 2013-10-18 DIAGNOSIS — W19XXXA Unspecified fall, initial encounter: Secondary | ICD-10-CM | POA: Diagnosis not present

## 2013-10-18 DIAGNOSIS — Y92099 Unspecified place in other non-institutional residence as the place of occurrence of the external cause: Secondary | ICD-10-CM | POA: Diagnosis not present

## 2013-10-18 DIAGNOSIS — R51 Headache: Secondary | ICD-10-CM | POA: Diagnosis not present

## 2013-10-18 DIAGNOSIS — S199XXA Unspecified injury of neck, initial encounter: Secondary | ICD-10-CM | POA: Diagnosis not present

## 2013-10-18 DIAGNOSIS — S0003XA Contusion of scalp, initial encounter: Secondary | ICD-10-CM | POA: Diagnosis not present

## 2013-10-18 DIAGNOSIS — S0990XA Unspecified injury of head, initial encounter: Secondary | ICD-10-CM | POA: Diagnosis not present

## 2013-10-18 DIAGNOSIS — M542 Cervicalgia: Secondary | ICD-10-CM | POA: Diagnosis not present

## 2013-10-21 DIAGNOSIS — G518 Other disorders of facial nerve: Secondary | ICD-10-CM | POA: Diagnosis not present

## 2013-10-30 DIAGNOSIS — G894 Chronic pain syndrome: Secondary | ICD-10-CM | POA: Diagnosis not present

## 2013-10-30 DIAGNOSIS — M5136 Other intervertebral disc degeneration, lumbar region: Secondary | ICD-10-CM | POA: Diagnosis not present

## 2013-10-30 DIAGNOSIS — Z79891 Long term (current) use of opiate analgesic: Secondary | ICD-10-CM | POA: Diagnosis not present

## 2013-11-07 DIAGNOSIS — H04123 Dry eye syndrome of bilateral lacrimal glands: Secondary | ICD-10-CM | POA: Diagnosis not present

## 2013-11-12 DIAGNOSIS — L638 Other alopecia areata: Secondary | ICD-10-CM | POA: Diagnosis not present

## 2013-11-27 DIAGNOSIS — Z79891 Long term (current) use of opiate analgesic: Secondary | ICD-10-CM | POA: Diagnosis not present

## 2013-11-27 DIAGNOSIS — M5136 Other intervertebral disc degeneration, lumbar region: Secondary | ICD-10-CM | POA: Diagnosis not present

## 2013-12-19 DIAGNOSIS — H01004 Unspecified blepharitis left upper eyelid: Secondary | ICD-10-CM | POA: Diagnosis not present

## 2013-12-19 DIAGNOSIS — H01001 Unspecified blepharitis right upper eyelid: Secondary | ICD-10-CM | POA: Diagnosis not present

## 2013-12-19 DIAGNOSIS — H04123 Dry eye syndrome of bilateral lacrimal glands: Secondary | ICD-10-CM | POA: Diagnosis not present

## 2013-12-24 DIAGNOSIS — L638 Other alopecia areata: Secondary | ICD-10-CM | POA: Diagnosis not present

## 2014-01-02 ENCOUNTER — Ambulatory Visit: Payer: Medicare Other | Admitting: Gynecologic Oncology

## 2014-01-03 DIAGNOSIS — I10 Essential (primary) hypertension: Secondary | ICD-10-CM | POA: Diagnosis not present

## 2014-01-03 DIAGNOSIS — E785 Hyperlipidemia, unspecified: Secondary | ICD-10-CM | POA: Diagnosis not present

## 2014-01-03 DIAGNOSIS — M5136 Other intervertebral disc degeneration, lumbar region: Secondary | ICD-10-CM | POA: Diagnosis not present

## 2014-01-03 DIAGNOSIS — J449 Chronic obstructive pulmonary disease, unspecified: Secondary | ICD-10-CM | POA: Diagnosis not present

## 2014-01-03 DIAGNOSIS — F319 Bipolar disorder, unspecified: Secondary | ICD-10-CM | POA: Diagnosis not present

## 2014-01-03 DIAGNOSIS — I779 Disorder of arteries and arterioles, unspecified: Secondary | ICD-10-CM | POA: Diagnosis not present

## 2014-01-23 DIAGNOSIS — M5136 Other intervertebral disc degeneration, lumbar region: Secondary | ICD-10-CM | POA: Diagnosis not present

## 2014-01-23 DIAGNOSIS — Z79891 Long term (current) use of opiate analgesic: Secondary | ICD-10-CM | POA: Diagnosis not present

## 2014-02-03 DIAGNOSIS — G518 Other disorders of facial nerve: Secondary | ICD-10-CM | POA: Diagnosis not present

## 2014-02-04 DIAGNOSIS — L638 Other alopecia areata: Secondary | ICD-10-CM | POA: Diagnosis not present

## 2014-02-06 DIAGNOSIS — H01004 Unspecified blepharitis left upper eyelid: Secondary | ICD-10-CM | POA: Diagnosis not present

## 2014-02-06 DIAGNOSIS — H04123 Dry eye syndrome of bilateral lacrimal glands: Secondary | ICD-10-CM | POA: Diagnosis not present

## 2014-02-06 DIAGNOSIS — H01001 Unspecified blepharitis right upper eyelid: Secondary | ICD-10-CM | POA: Diagnosis not present

## 2014-02-13 ENCOUNTER — Encounter: Payer: Self-pay | Admitting: Gynecologic Oncology

## 2014-02-13 ENCOUNTER — Ambulatory Visit: Payer: Medicare Other | Attending: Gynecologic Oncology | Admitting: Gynecologic Oncology

## 2014-02-13 ENCOUNTER — Other Ambulatory Visit (HOSPITAL_COMMUNITY)
Admission: RE | Admit: 2014-02-13 | Discharge: 2014-02-13 | Disposition: A | Discharge status: Home / Self Care | Payer: Medicare Other | Source: Ambulatory Visit | Attending: Gynecologic Oncology | Admitting: Gynecologic Oncology

## 2014-02-13 VITALS — BP 142/66 | HR 63 | Temp 97.8°F | Resp 18 | Ht 66.0 in | Wt 169.8 lb

## 2014-02-13 DIAGNOSIS — Z01411 Encounter for gynecological examination (general) (routine) with abnormal findings: Secondary | ICD-10-CM | POA: Diagnosis not present

## 2014-02-13 DIAGNOSIS — C52 Malignant neoplasm of vagina: Secondary | ICD-10-CM | POA: Diagnosis not present

## 2014-02-13 NOTE — Patient Instructions (Signed)
Dr. Alycia Rossetti would like to see you in 6 months. Please call in May for your July, 2016 appointment.

## 2014-02-13 NOTE — Progress Notes (Signed)
Consult Note: Gyn-Onc  [name] 69 y.o. female  CC:  Chief Complaint  Patient presents with  . Vaginal Cancer    HPI: [name] is a very pleasant 72 year old with a 10 year history of abnormal Pap smears. In April 2011 she had a Pap smear revealed atypical squamous cells cannot exclude a high-grade dysplasia. She was worked up and evaluated and subsequently underwent a vaginal hysterectomy in June of 2011. Pathology at that time revealed the cervix with high-grade squamous dysplasia. Focal adenomyosis. In addition, the ectocervical margin of the cervix was positive. She was seen by Dr. Deatra Ina on October 29 at which time a Pap smear revealed VAIN2/VAIN 3.   She underwent laser ablation on for dysplasia on February 07, 2010. Operative findings included a raised hyperkeratotic lesion in the vagina encompassing the entire vaginal cuff. The area was acetowhite changing. The total area measured approximately 4 x 4 centimeters.  I saw her in 2013 and her Pap smear was normal. She subsequently saw Dr. Deatra Ina October 30, 13 and her Pap smear returned as atypical squamous cells of undetermined significance. The patient has positive high-risk HPV.  I saw her in late 2013 acetowhite epithelial changes at the top of the vagina. However, there was a punctate area with telangiectasias in the upper right vaginal fornix. A biopsy of this was performed. It revealed a squamous cell carcinoma.   She completed 5 fractions of HDR with Dr. Lanell Persons in 2/14. She was ultimately diagnosed with a renal cell carcinoma and is status post a partial left nephrectomy 07/09/2012. The pathology revealed a T1-1 grade 2 clear cell renal cell carcinoma. The margins were negative and she was dispositioned to close followup. She comes in today for followup Pap smear. Pap smear performed in May of 2014 revealed atypical squamous cells of undetermined significance. However, she just finished her brachytherapy on February 4. I  last saw her 6/15 at which time her exam and Pap smear were negative. She has been using her vaginal dilators and her vaginal Premarin cream. She saw Dr. Lanell Persons in September of 2015 and had a negative exam.   Interval History: She comes in today for followup. She continues to have her constipation. She's taking Colace twice a day and MiraLAX every day. With this regimen she's able to have a bowel movement every day to every other day. She is having increasing issues with regards to her back pain and degenerative joint disease. She is under the care of Dr. Nelva Bush. She's also had some hip pain both anteriorly and posteriorly. She saw Dr. Alvan Dame in orthopedics. Films were done and is felt that her hip pain is most likely radiating from her back. Dr. Nelva Bush has her on pain medication that she is taking every 4 hours. Because of the pain she's not been able to exercise and she's gained approximately 7 pounds since we saw her in June. She is very bothered by the weight gain and would like to lose some weight but can't due to lack of the neck lack of activity. She's noticed her balance getting worse. They have not discussed physical therapy. She is secondary to the pain medicine, having increasing issues with constipation. .  Review of Systems  Constitutional: No complaints, weight is up Skin: No rash, sores, jaundice, itching, or dryness.  Cardiovascular: No chest pain, no sob, no edema  Pulmonary: No cough  Gastro Intestinal: No nausea, vomiting, + constipation, no diarrhea reported. No bright red blood per rectum or  change in bowel movement.  Genitourinary: No frequency, urgency, or dysuria.  Denies vaginal bleeding and discharge.  Using dilators 3x/week. Musculoskeletal: + Back pain which radiates to her legs right greater than left.  Neurologic: No changes Psychology: No issues   Current Meds:  Outpatient Encounter Prescriptions as of 02/13/2014  Medication Sig  . albuterol (PROVENTIL HFA;VENTOLIN HFA)  108 (90 BASE) MCG/ACT inhaler Inhale 2 puffs into the lungs every 6 (six) hours as needed for wheezing.  Marland Kitchen amLODipine (NORVASC) 2.5 MG tablet Take 1 tablet (2.5 mg total) by mouth daily.  . ARIPiprazole (ABILIFY) 15 MG tablet Take 15 mg by mouth every morning.   Marland Kitchen aspirin EC 81 MG tablet Take 81 mg by mouth every morning.   . clonazePAM (KLONOPIN) 1 MG tablet Take 1 mg by mouth 3 (three) times daily as needed for anxiety.   . cloNIDine (CATAPRES) 0.2 MG tablet Take 0.2 mg by mouth 2 (two) times daily.   . cycloSPORINE (RESTASIS) 0.05 % ophthalmic emulsion Place 1 drop into both eyes 2 (two) times daily.  Marland Kitchen docusate sodium (COLACE) 100 MG capsule Take 1 capsule (100 mg total) by mouth 2 (two) times daily.  Marland Kitchen esomeprazole (NEXIUM) 20 MG capsule Take 20 mg by mouth daily before breakfast.  . furosemide (LASIX) 20 MG tablet Take 20 mg by mouth every morning.  . lamoTRIgine (LAMICTAL) 100 MG tablet Take 200 mg by mouth 2 (two) times daily.   . Lubricants (SURGILUBE EX) Place 1 application vaginally daily before supper.   . mometasone-formoterol (DULERA) 100-5 MCG/ACT AERO Inhale 2 puffs into the lungs 2 (two) times daily.  Marland Kitchen olmesartan-hydrochlorothiazide (BENICAR HCT) 40-25 MG per tablet Take 1 tablet by mouth every morning.   Marland Kitchen oxyCODONE-acetaminophen (PERCOCET) 10-325 MG per tablet Take 1 tablet by mouth every 6 (six) hours as needed for pain.  . potassium chloride (KLOR-CON) 8 MEQ tablet Take 8 mEq by mouth daily.  . rosuvastatin (CRESTOR) 5 MG tablet Take 5 mg by mouth at bedtime.  . sertraline (ZOLOFT) 50 MG tablet Take 50 mg by mouth every morning.   . tiotropium (SPIRIVA) 18 MCG inhalation capsule Place 18 mcg into inhaler and inhale daily. EVENING    Allergy: No Known Allergies  Social Hx:   History   Social History  . Marital Status: Divorced    Spouse Name: N/A    Number of Children: N/A  . Years of Education: N/A   Occupational History  . Not on file.   Social History Main  Topics  . Smoking status: Former Smoker -- 2.00 packs/day for 50 years    Types: Cigarettes    Quit date: 01/18/2011  . Smokeless tobacco: Never Used     Comment: STATES QUIT SMOKING 01-18-2011  . Alcohol Use: No     Comment: RECOVERING ALCOHOLIC--   QUIT IN 8756  . Drug Use: No  . Sexual Activity: No   Other Topics Concern  . Not on file   Social History Narrative    Past Surgical Hx:  Past Surgical History  Procedure Laterality Date  . Total knee arthroplasty  09-22-2009    RIGHT  . Vaginal hysterectomy  07-06-2009  . Hemiarthroplasty hip  12-26-2008    LEFT FEMORAL NECK FX  . Total hip arthroplasty  04-15-2008    POST FAILED  RIGHT HIP ORIF FEMORAL FX  . Cervical conization w/bx  09-23-2008  . Orif hip fracture  02-13-2007    RIGHT FEMORAL NECK FX  . Right  shoulder surg.  2007  . Coronary angioplasty with stent placement  2000-   INFERIOR MI    X1 STENT TO RCA  . Carotid endarterectomy  1995    RIGHT  . Cataract extraction w/ intraocular lens  implant, bilateral    . Orif right distal radius and right proximal humerous neck fx's  10-10-2005  . Upper right vaginal region  12/28/11    BIOPSY: SQUAMOUS CELL CARCINOMA  . Eus N/A 03/05/2012    Procedure: FULL UPPER ENDOSCOPIC ULTRASOUND (EUS) RADIAL and EGD;  Surgeon: Milus Banister, MD;  Location: [facility];  Service: Endoscopy;  Laterality: N/A;  ercp scope first than eus scope  . Robotic assited partial nephrectomy Left 07/09/2012    Procedure: ROBOTIC ASSITED PARTIAL NEPHRECTOMY;  Surgeon: Dutch Gray, MD;  Location: [facility];  Service: Urology;  Laterality: Left;    Past Medical Hx:  Past Medical History  Diagnosis Date  . Hypertension   . Depression   . Alcoholism     recovering since 2000  . Inferior MI 2000--  POST PTCA W/ STENT X1  . Bipolar disorder   . Peripheral vascular disease POST RIGHT CAROTID SURG.  1995  . Status post carotid endarterectomy RIGHT --  1995  . Status post primary angioplasty with  coronary stent 2000--  POST INFERIOR MI  . Blood transfusion   . Normal cardiac stress test 2008-- PER DR [place]ique NOTE 09-07-2009  . Arthritis BACK  . Chronic back pain   . Osteoporosis   . Rosacea LEFT FACIAL RASH  . Idiopathic acute facial nerve palsy LEFT SIDE--  BOTOX THERAPY  . Blepharospasm LEFT EYE  . History of alcohol abuse RECOVERING SINCE 2000  . Anxiety   . Unstable balance WALKS W/ CANE  . HTN (hypertension)   . Vaginal cancer   . Cervical cancer 12/28/11     vagina upper right bx==squamous cell ca  . Emphysema   . S/P radiation therapy 02/21/2012    HDR 5 Fractions to Upper Vaginal Area  . Left renal mass 03/02/12  . Blepharospasm     left  . Seizures     x 1 after abrupt discontinuation of  Clonidine  . PONV (postoperative nausea and vomiting)   . Other and unspecified general anesthetics causing adverse effect in therapeutic use post op delirium--  last anes record w/ chart  from   09-22-2009 (spinal w/ light sedation)  . Coronary artery disease CARDIOLOGIST- DR Martinique--- LAST VISIT NOTE 09-07-2009  W/ CHART  . Renal cell carcinoma 07/09/12    Left mass    Family Hx:  Family History  Problem Relation Age of Onset  . Hypertension Mother   . Hypertension Father     Vitals:  Blood pressure 142/66, pulse 63, temperature 97.8 F (36.6 C), temperature source Oral, resp. rate 18, height 5\' 6"  (1.676 m), weight 169 lb 12.8 oz (77.021 kg), SpO2 94 %.  Physical Exam:  Well-nourished well-developed female in no acute distress.   Neck: Supple, no lymphadenopathy, no thyromegaly.  Abdomen: Soft, nontender, nondistended. There is no palpable mass or prostatomegaly. Well-healed robotic skin incisions.  Groins: No lymphadenopathy.  Extremities: No edema.  Pelvic: Normal external female genitalia. Vagina is markedly atrophic. Vaginal cuff is visualized is no visible lesions except for radiation telangectasias. Improved vaginal length with dilators. ThinPrep Pap was  submitted without difficulty. Bimanual examination reveals no masses or nodularity. Rectal confirms.  Assessment/Plan: 69 year old with a clinical stage I vaginal carcinoma treated with  high-dose rate vaginal brachyherapy. She completed her treatment on February 21, 2012.  We'll followup in results for Pap smear from today. She will see Dr. Lanell Persons  In 3 months and return to see me in 6 months.  Troye Hiemstra A., MD 02/13/2014, 9:21 AM

## 2014-02-17 LAB — CYTOLOGY - PAP

## 2014-02-18 ENCOUNTER — Telehealth: Payer: Self-pay | Admitting: Gynecologic Oncology

## 2014-02-18 NOTE — Telephone Encounter (Signed)
Patient notified with pap smear results: negative.  Instructed to call for any questions or concerns.

## 2014-02-19 ENCOUNTER — Other Ambulatory Visit (HOSPITAL_COMMUNITY): Payer: Self-pay | Admitting: Urology

## 2014-02-19 ENCOUNTER — Ambulatory Visit (HOSPITAL_COMMUNITY)
Admission: RE | Admit: 2014-02-19 | Discharge: 2014-02-19 | Disposition: A | Discharge status: Home / Self Care | Payer: Medicare Other | Source: Ambulatory Visit | Attending: Urology | Admitting: Urology

## 2014-02-19 DIAGNOSIS — J449 Chronic obstructive pulmonary disease, unspecified: Secondary | ICD-10-CM | POA: Diagnosis not present

## 2014-02-19 DIAGNOSIS — Z85528 Personal history of other malignant neoplasm of kidney: Secondary | ICD-10-CM | POA: Diagnosis not present

## 2014-02-19 DIAGNOSIS — C649 Malignant neoplasm of unspecified kidney, except renal pelvis: Secondary | ICD-10-CM | POA: Diagnosis not present

## 2014-02-21 ENCOUNTER — Ambulatory Visit: Payer: Medicare Other | Admitting: Radiation Oncology

## 2014-02-26 DIAGNOSIS — C649 Malignant neoplasm of unspecified kidney, except renal pelvis: Secondary | ICD-10-CM | POA: Diagnosis not present

## 2014-03-13 ENCOUNTER — Encounter: Payer: Self-pay | Admitting: *Deleted

## 2014-03-14 DIAGNOSIS — Z79899 Other long term (current) drug therapy: Secondary | ICD-10-CM | POA: Diagnosis not present

## 2014-03-14 DIAGNOSIS — Z79891 Long term (current) use of opiate analgesic: Secondary | ICD-10-CM | POA: Diagnosis not present

## 2014-03-14 DIAGNOSIS — M5136 Other intervertebral disc degeneration, lumbar region: Secondary | ICD-10-CM | POA: Diagnosis not present

## 2014-03-14 DIAGNOSIS — G894 Chronic pain syndrome: Secondary | ICD-10-CM | POA: Diagnosis not present

## 2014-03-18 DIAGNOSIS — L638 Other alopecia areata: Secondary | ICD-10-CM | POA: Diagnosis not present

## 2014-03-20 DIAGNOSIS — M4186 Other forms of scoliosis, lumbar region: Secondary | ICD-10-CM | POA: Diagnosis not present

## 2014-03-20 DIAGNOSIS — G894 Chronic pain syndrome: Secondary | ICD-10-CM | POA: Diagnosis not present

## 2014-03-20 DIAGNOSIS — M5136 Other intervertebral disc degeneration, lumbar region: Secondary | ICD-10-CM | POA: Diagnosis not present

## 2014-03-21 ENCOUNTER — Ambulatory Visit
Admission: RE | Admit: 2014-03-21 | Discharge: 2014-03-21 | Disposition: A | Discharge status: Home / Self Care | Payer: Medicare Other | Source: Ambulatory Visit | Attending: Radiation Oncology | Admitting: Radiation Oncology

## 2014-03-21 ENCOUNTER — Encounter: Payer: Self-pay | Admitting: Radiation Oncology

## 2014-03-21 VITALS — BP 149/85 | HR 60 | Temp 97.6°F | Resp 20 | Wt 172.4 lb

## 2014-03-21 DIAGNOSIS — C52 Malignant neoplasm of vagina: Secondary | ICD-10-CM | POA: Diagnosis not present

## 2014-03-21 NOTE — Progress Notes (Signed)
Radiation Oncology         [phone] ________________________________  Name: CAMELLA Martinez MRN: [id number]  Date: 03/21/2014  DOB: 06/09/1945  Follow-Up Visit Note  Outpatient    ICD-9-CM ICD-10-CM   1. Cancer of vaginal vault - recurrent from cervix 184.0 C52      CC: Vidal Schwalbe, MD  Sherol Dade., MD  Diagnosis and Prior Radiotherapy:   Squamous cell carcinoma at the vaginal cuff - recurrent cervical cancer presenting as stage I vaginal cancer  She is status post 38.75 Gray in 5 fractions, high dose rate vaginal cuff brachytherapy, 5.5 cm length of proximal vagina treated. She completed treatment on 02/21/2012  Narrative:  The patient returns today for routine follow-up.      Patient denies pain, fatigue, loss of appetite. She has constipation issues, takes stool softeners, Miralax daily. She occasionally has urinary incontinence. She denies vaginal discharge.  She is using her vaginal dilator three times weekly. Her last pap smear was 02/13/14 and was negative. She was examined by Dr [place] at that time.                           ALLERGIES:  has No Known Allergies.  Meds: Current Outpatient Prescriptions  Medication Sig Dispense Refill  . albuterol (PROVENTIL HFA;VENTOLIN HFA) 108 (90 BASE) MCG/ACT inhaler Inhale 2 puffs into the lungs every 6 (six) hours as needed for wheezing.    Marland Kitchen amLODipine (NORVASC) 2.5 MG tablet Take 1 tablet (2.5 mg total) by mouth daily. 30 tablet 11  . ARIPiprazole (ABILIFY) 15 MG tablet Take 15 mg by mouth every morning.     Marland Kitchen aspirin EC 81 MG tablet Take 81 mg by mouth every morning.     . Calcium Carbonate-Vitamin D 600-400 MG-UNIT per tablet Take 1 tablet by mouth.    . Cholecalciferol (VITAMIN D3) 2000 UNITS TABS Take 1 capsule by mouth.    . clonazePAM (KLONOPIN) 1 MG tablet Take 1 mg by mouth 3 (three) times daily as needed for anxiety.     . cloNIDine (CATAPRES) 0.2 MG tablet Take 0.2 mg by mouth 2 (two) times daily.     Marland Kitchen  conjugated estrogens (PREMARIN) vaginal cream Place 1 Applicatorful vaginally daily.    . cycloSPORINE (RESTASIS) 0.05 % ophthalmic emulsion Place 1 drop into both eyes 2 (two) times daily.    Marland Kitchen docusate sodium (COLACE) 100 MG capsule Take 1 capsule (100 mg total) by mouth 2 (two) times daily. 30 capsule 0  . furosemide (LASIX) 20 MG tablet Take 20 mg by mouth every morning.    . lamoTRIgine (LAMICTAL) 100 MG tablet Take 200 mg by mouth 2 (two) times daily.     . Lubricants (SURGILUBE EX) Place 1 application vaginally daily before supper.     . mometasone-formoterol (DULERA) 100-5 MCG/ACT AERO Inhale 2 puffs into the lungs 2 (two) times daily. 1 Inhaler 3  . Multiple Vitamin (MULTIVITAMIN) capsule Take 1 capsule by mouth.    . OxyCODONE HCl ER 30 MG T12A Take 30 mg by mouth.    . oxyCODONE-acetaminophen (PERCOCET) 10-325 MG per tablet Take 1 tablet by mouth every 6 (six) hours as needed for pain.    . polyethylene glycol (MIRALAX / GLYCOLAX) packet Take 17 g by mouth daily.    . potassium chloride (KLOR-CON) 8 MEQ tablet Take 8 mEq by mouth daily.    . rosuvastatin (CRESTOR) 5 MG tablet Take 5 mg  by mouth at bedtime.    . sertraline (ZOLOFT) 50 MG tablet Take 50 mg by mouth every morning.     . simvastatin (ZOCOR) 10 MG tablet Take 10 mg by mouth.    . tiotropium (SPIRIVA) 18 MCG inhalation capsule Place 18 mcg into inhaler and inhale daily. EVENING    . esomeprazole (NEXIUM) 20 MG capsule Take 20 mg by mouth daily before breakfast.     No current facility-administered medications for this encounter.    Physical Findings: The patient is in no acute distress. Patient is alert and oriented.  weight is 172 lb 6.4 oz (78.2 kg). Her temperature is 97.6 F (36.4 C). Her blood pressure is 149/85 and her pulse is 60. Her respiration is 20. Marland Kitchen  General: Alert and oriented, in no acute distress Neck: Neck is supple, no palpable cervical or supraclavicular lymphadenopathy. Heart: Regular in rate and  rhythm with no murmurs, rubs, or gallops. Chest: Clear to auscultation bilaterally, with no rhonchi, wheezes, or rales. Abdomen: Soft, nontender, nondistended, with no rigidity or guarding. Extremities: No cyanosis or edema. Lymphatics: see Neck Exam  GYN exam deferred due to recent GYN visit.    Lab Findings: Lab Results  Component Value Date   WBC 5.6 07/02/2012   HGB 9.7* 07/12/2012   HCT 28.7* 07/12/2012   MCV 87.3 07/02/2012   PLT 323 07/02/2012    Radiographic Findings: No results found.  Impression/Plan: Doing well. Continue using vaginal dilator. I will see her back in 7 months. We will try to get her back on a schedule to space out her appts with me and GYN/ONC q 3 months.     Jade Gibson, MD

## 2014-03-21 NOTE — Progress Notes (Addendum)
Patient denies pain, fatigue, loss of appetite. She has constipation issues, takes stool softeners, Miralax daily. She occasionally has urinary incontinence. She denies vaginal discharge.  She is using her vaginal dilator three times weekly. Her last pap smear was 02/13/14.

## 2014-03-31 ENCOUNTER — Ambulatory Visit (HOSPITAL_COMMUNITY): Payer: Medicare Other | Attending: Cardiology | Admitting: Cardiology

## 2014-03-31 DIAGNOSIS — I779 Disorder of arteries and arterioles, unspecified: Secondary | ICD-10-CM | POA: Diagnosis not present

## 2014-03-31 DIAGNOSIS — I6523 Occlusion and stenosis of bilateral carotid arteries: Secondary | ICD-10-CM | POA: Insufficient documentation

## 2014-03-31 DIAGNOSIS — I739 Peripheral vascular disease, unspecified: Secondary | ICD-10-CM

## 2014-03-31 NOTE — Progress Notes (Signed)
Carotid duplex performed 

## 2014-04-01 ENCOUNTER — Other Ambulatory Visit: Payer: Self-pay

## 2014-04-01 DIAGNOSIS — I739 Peripheral vascular disease, unspecified: Principal | ICD-10-CM

## 2014-04-01 DIAGNOSIS — I779 Disorder of arteries and arterioles, unspecified: Secondary | ICD-10-CM

## 2014-04-08 DIAGNOSIS — I2510 Atherosclerotic heart disease of native coronary artery without angina pectoris: Secondary | ICD-10-CM | POA: Diagnosis not present

## 2014-04-08 DIAGNOSIS — M5136 Other intervertebral disc degeneration, lumbar region: Secondary | ICD-10-CM | POA: Diagnosis not present

## 2014-04-08 DIAGNOSIS — I779 Disorder of arteries and arterioles, unspecified: Secondary | ICD-10-CM | POA: Diagnosis not present

## 2014-04-08 DIAGNOSIS — F319 Bipolar disorder, unspecified: Secondary | ICD-10-CM | POA: Diagnosis not present

## 2014-04-08 DIAGNOSIS — R3919 Other difficulties with micturition: Secondary | ICD-10-CM | POA: Diagnosis not present

## 2014-04-08 DIAGNOSIS — N289 Disorder of kidney and ureter, unspecified: Secondary | ICD-10-CM | POA: Diagnosis not present

## 2014-04-08 DIAGNOSIS — I10 Essential (primary) hypertension: Secondary | ICD-10-CM | POA: Diagnosis not present

## 2014-04-08 DIAGNOSIS — M810 Age-related osteoporosis without current pathological fracture: Secondary | ICD-10-CM | POA: Diagnosis not present

## 2014-04-08 DIAGNOSIS — I252 Old myocardial infarction: Secondary | ICD-10-CM | POA: Diagnosis not present

## 2014-04-08 DIAGNOSIS — E785 Hyperlipidemia, unspecified: Secondary | ICD-10-CM | POA: Diagnosis not present

## 2014-04-14 DIAGNOSIS — G894 Chronic pain syndrome: Secondary | ICD-10-CM | POA: Diagnosis not present

## 2014-04-14 DIAGNOSIS — Z79891 Long term (current) use of opiate analgesic: Secondary | ICD-10-CM | POA: Diagnosis not present

## 2014-04-14 DIAGNOSIS — M5136 Other intervertebral disc degeneration, lumbar region: Secondary | ICD-10-CM | POA: Diagnosis not present

## 2014-04-25 DIAGNOSIS — N3941 Urge incontinence: Secondary | ICD-10-CM | POA: Diagnosis not present

## 2014-04-25 DIAGNOSIS — R339 Retention of urine, unspecified: Secondary | ICD-10-CM | POA: Diagnosis not present

## 2014-04-28 DIAGNOSIS — N289 Disorder of kidney and ureter, unspecified: Secondary | ICD-10-CM | POA: Diagnosis not present

## 2014-04-29 DIAGNOSIS — L638 Other alopecia areata: Secondary | ICD-10-CM | POA: Diagnosis not present

## 2014-04-29 DIAGNOSIS — L219 Seborrheic dermatitis, unspecified: Secondary | ICD-10-CM | POA: Diagnosis not present

## 2014-04-30 DIAGNOSIS — H01004 Unspecified blepharitis left upper eyelid: Secondary | ICD-10-CM | POA: Diagnosis not present

## 2014-04-30 DIAGNOSIS — H04123 Dry eye syndrome of bilateral lacrimal glands: Secondary | ICD-10-CM | POA: Diagnosis not present

## 2014-04-30 DIAGNOSIS — H01005 Unspecified blepharitis left lower eyelid: Secondary | ICD-10-CM | POA: Diagnosis not present

## 2014-04-30 DIAGNOSIS — H01001 Unspecified blepharitis right upper eyelid: Secondary | ICD-10-CM | POA: Diagnosis not present

## 2014-04-30 DIAGNOSIS — H01002 Unspecified blepharitis right lower eyelid: Secondary | ICD-10-CM | POA: Diagnosis not present

## 2014-05-12 DIAGNOSIS — G518 Other disorders of facial nerve: Secondary | ICD-10-CM | POA: Diagnosis not present

## 2014-05-13 DIAGNOSIS — R339 Retention of urine, unspecified: Secondary | ICD-10-CM | POA: Diagnosis not present

## 2014-05-13 DIAGNOSIS — N3941 Urge incontinence: Secondary | ICD-10-CM | POA: Diagnosis not present

## 2014-05-21 ENCOUNTER — Other Ambulatory Visit: Payer: Self-pay

## 2014-05-21 DIAGNOSIS — Z1231 Encounter for screening mammogram for malignant neoplasm of breast: Secondary | ICD-10-CM

## 2014-06-02 DIAGNOSIS — H02402 Unspecified ptosis of left eyelid: Secondary | ICD-10-CM | POA: Diagnosis not present

## 2014-06-10 DIAGNOSIS — L638 Other alopecia areata: Secondary | ICD-10-CM | POA: Diagnosis not present

## 2014-06-12 DIAGNOSIS — G894 Chronic pain syndrome: Secondary | ICD-10-CM | POA: Diagnosis not present

## 2014-06-12 DIAGNOSIS — M5136 Other intervertebral disc degeneration, lumbar region: Secondary | ICD-10-CM | POA: Diagnosis not present

## 2014-06-20 DIAGNOSIS — G894 Chronic pain syndrome: Secondary | ICD-10-CM | POA: Diagnosis not present

## 2014-06-20 DIAGNOSIS — S335XXA Sprain of ligaments of lumbar spine, initial encounter: Secondary | ICD-10-CM | POA: Diagnosis not present

## 2014-06-20 DIAGNOSIS — M5136 Other intervertebral disc degeneration, lumbar region: Secondary | ICD-10-CM | POA: Diagnosis not present

## 2014-06-20 DIAGNOSIS — Z79891 Long term (current) use of opiate analgesic: Secondary | ICD-10-CM | POA: Diagnosis not present

## 2014-06-23 ENCOUNTER — Ambulatory Visit
Admission: RE | Admit: 2014-06-23 | Discharge: 2014-06-23 | Disposition: A | Discharge status: Home / Self Care | Payer: Medicare Other | Source: Ambulatory Visit

## 2014-06-23 DIAGNOSIS — Z1231 Encounter for screening mammogram for malignant neoplasm of breast: Secondary | ICD-10-CM | POA: Diagnosis not present

## 2014-07-01 DIAGNOSIS — H01004 Unspecified blepharitis left upper eyelid: Secondary | ICD-10-CM | POA: Diagnosis not present

## 2014-07-01 DIAGNOSIS — H01002 Unspecified blepharitis right lower eyelid: Secondary | ICD-10-CM | POA: Diagnosis not present

## 2014-07-01 DIAGNOSIS — H01001 Unspecified blepharitis right upper eyelid: Secondary | ICD-10-CM | POA: Diagnosis not present

## 2014-07-01 DIAGNOSIS — H01005 Unspecified blepharitis left lower eyelid: Secondary | ICD-10-CM | POA: Diagnosis not present

## 2014-07-01 DIAGNOSIS — H04123 Dry eye syndrome of bilateral lacrimal glands: Secondary | ICD-10-CM | POA: Diagnosis not present

## 2014-07-09 DIAGNOSIS — I10 Essential (primary) hypertension: Secondary | ICD-10-CM | POA: Diagnosis not present

## 2014-07-09 DIAGNOSIS — F319 Bipolar disorder, unspecified: Secondary | ICD-10-CM | POA: Diagnosis not present

## 2014-07-09 DIAGNOSIS — E785 Hyperlipidemia, unspecified: Secondary | ICD-10-CM | POA: Diagnosis not present

## 2014-07-10 DIAGNOSIS — N3941 Urge incontinence: Secondary | ICD-10-CM | POA: Diagnosis not present

## 2014-07-10 DIAGNOSIS — R339 Retention of urine, unspecified: Secondary | ICD-10-CM | POA: Diagnosis not present

## 2014-07-14 ENCOUNTER — Other Ambulatory Visit: Payer: Self-pay

## 2014-07-18 DIAGNOSIS — R339 Retention of urine, unspecified: Secondary | ICD-10-CM | POA: Diagnosis not present

## 2014-07-18 DIAGNOSIS — N3941 Urge incontinence: Secondary | ICD-10-CM | POA: Diagnosis not present

## 2014-07-22 DIAGNOSIS — L638 Other alopecia areata: Secondary | ICD-10-CM | POA: Diagnosis not present

## 2014-08-01 ENCOUNTER — Other Ambulatory Visit (HOSPITAL_COMMUNITY): Payer: Self-pay | Admitting: Urology

## 2014-08-01 ENCOUNTER — Ambulatory Visit (HOSPITAL_COMMUNITY)
Admission: RE | Admit: 2014-08-01 | Discharge: 2014-08-01 | Disposition: A | Discharge status: Home / Self Care | Payer: Medicare Other | Source: Ambulatory Visit | Attending: Urology | Admitting: Urology

## 2014-08-01 DIAGNOSIS — J984 Other disorders of lung: Secondary | ICD-10-CM | POA: Diagnosis not present

## 2014-08-01 DIAGNOSIS — C649 Malignant neoplasm of unspecified kidney, except renal pelvis: Secondary | ICD-10-CM

## 2014-08-01 DIAGNOSIS — J449 Chronic obstructive pulmonary disease, unspecified: Secondary | ICD-10-CM | POA: Diagnosis not present

## 2014-08-05 DIAGNOSIS — C649 Malignant neoplasm of unspecified kidney, except renal pelvis: Secondary | ICD-10-CM | POA: Diagnosis not present

## 2014-08-05 DIAGNOSIS — C642 Malignant neoplasm of left kidney, except renal pelvis: Secondary | ICD-10-CM | POA: Diagnosis not present

## 2014-08-05 DIAGNOSIS — N289 Disorder of kidney and ureter, unspecified: Secondary | ICD-10-CM | POA: Diagnosis not present

## 2014-08-06 DIAGNOSIS — H04123 Dry eye syndrome of bilateral lacrimal glands: Secondary | ICD-10-CM | POA: Diagnosis not present

## 2014-09-01 DIAGNOSIS — H02402 Unspecified ptosis of left eyelid: Secondary | ICD-10-CM | POA: Diagnosis not present

## 2014-09-01 DIAGNOSIS — G518 Other disorders of facial nerve: Secondary | ICD-10-CM | POA: Diagnosis not present

## 2014-09-02 DIAGNOSIS — L638 Other alopecia areata: Secondary | ICD-10-CM | POA: Diagnosis not present

## 2014-09-11 ENCOUNTER — Telehealth: Payer: Self-pay | Admitting: *Deleted

## 2014-09-11 DIAGNOSIS — Z79891 Long term (current) use of opiate analgesic: Secondary | ICD-10-CM | POA: Diagnosis not present

## 2014-09-11 DIAGNOSIS — M5136 Other intervertebral disc degeneration, lumbar region: Secondary | ICD-10-CM | POA: Diagnosis not present

## 2014-09-11 DIAGNOSIS — G894 Chronic pain syndrome: Secondary | ICD-10-CM | POA: Diagnosis not present

## 2014-09-11 NOTE — Telephone Encounter (Signed)
Notified pt of scheduled appointment. Pt is scheduled on August 31,2016 with Dr. Alycia Rossetti @ 12:00. Pt agreed with time and date.

## 2014-09-17 ENCOUNTER — Other Ambulatory Visit (HOSPITAL_COMMUNITY)
Admission: RE | Admit: 2014-09-17 | Discharge: 2014-09-17 | Disposition: A | Discharge status: Home / Self Care | Payer: Medicare Other | Source: Ambulatory Visit | Attending: Gynecologic Oncology | Admitting: Gynecologic Oncology

## 2014-09-17 ENCOUNTER — Ambulatory Visit: Payer: Medicare Other | Attending: Gynecologic Oncology | Admitting: Gynecologic Oncology

## 2014-09-17 ENCOUNTER — Encounter: Payer: Self-pay | Admitting: Gynecologic Oncology

## 2014-09-17 VITALS — BP 105/71 | HR 63 | Temp 97.8°F | Resp 16 | Ht 66.0 in | Wt 162.5 lb

## 2014-09-17 DIAGNOSIS — Z01411 Encounter for gynecological examination (general) (routine) with abnormal findings: Secondary | ICD-10-CM | POA: Insufficient documentation

## 2014-09-17 DIAGNOSIS — C52 Malignant neoplasm of vagina: Secondary | ICD-10-CM | POA: Diagnosis not present

## 2014-09-17 NOTE — Progress Notes (Signed)
Consult Note: Gyn-Onc  [name] 69 y.o. female  CC:  Chief Complaint  Patient presents with  . Vaginal Cancer    follow-up    HPI: [name] is a very pleasant 65 year old with a 10 year history of abnormal Pap smears. In April 2011 she had a Pap smear revealed atypical squamous cells cannot exclude a high-grade dysplasia. She was worked up and evaluated and subsequently underwent a vaginal hysterectomy in June of 2011. Pathology at that time revealed the cervix with high-grade squamous dysplasia. Focal adenomyosis. In addition, the ectocervical margin of the cervix was positive. She was seen by Dr. Deatra Ina on October 29 at which time a Pap smear revealed VAIN2/VAIN 3.   She underwent laser ablation on for dysplasia on February 07, 2010. Operative findings included a raised hyperkeratotic lesion in the vagina encompassing the entire vaginal cuff. The area was acetowhite changing. The total area measured approximately 4 x 4 centimeters.  I saw her in 2013 and her Pap smear was normal. She subsequently saw Dr. Deatra Ina October 30, 13 and her Pap smear returned as atypical squamous cells of undetermined significance. The patient has positive high-risk HPV.  I saw her in late 2013 acetowhite epithelial changes at the top of the vagina. However, there was a punctate area with telangiectasias in the upper right vaginal fornix. A biopsy of this was performed. It revealed a squamous cell carcinoma.   She completed 5 fractions of HDR with Dr. Lanell Persons in 2/14. She was ultimately diagnosed with a renal cell carcinoma and is status post a partial left nephrectomy 07/09/2012. The pathology revealed a T1-1 grade 2 clear cell renal cell carcinoma. The margins were negative and she was dispositioned to close followup. She comes in today for followup Pap smear. Pap smear performed in May of 2014 revealed atypical squamous cells of undetermined significance. However, she just finished her brachytherapy on  February 4. I last saw her 1/16 at which time her exam and Pap smear were negative. She has been using her vaginal dilators and her vaginal Premarin cream. She saw Dr. Lanell Persons in 3/16 and had a negative exam.   Interval History: She comes in today for followup. Her biggest complaint is back pain. She's taking oxycodone every 4 hours. She is trying to walk more but needs to use a walker to feel sick care. She has been seen by a surgeon and there really are no surgical options other than pain management. She was seen by her orthopedic surgeon recently and he is willing to try some other injections if the oral narcotic stop working. The pain is in her back and also radiates down bilateral legs. She's had negative films and there is no issues with her hip replacements. She has no neuropathy or numbness. She does have some constipation from the narcotics but is well-controlled with Miralax. She has some shortness of breath with activity but no cough. She recently had a negative chest x-ray as follow-up for her renal cell carcinoma and that was unremarkable. Review of systems is otherwise as below. She has intentionally lost 7 pounds with walking as well as try to decrease the suite in her diet. She is up-to-date on her mammograms. There are no new medical problems in her family. She is still diligently using her Premarin cream and vaginal dilators.  Review of Systems  Constitutional: No complaints, weight is down Skin: No rash Cardiovascular: No chest pain, Occ sob, no edema  Pulmonary: No cough  Gastro Intestinal:  No nausea, vomiting, + constipation, no diarrhea reported. No bright red blood per rectum or change in bowel movement.  Genitourinary: No frequency, urgency, or dysuria.  Denies vaginal bleeding and discharge.  Using dilators 3x/week. Musculoskeletal: + Back pain which radiates to her legs bilaterally  Neurologic: No changes Psychology: No issues   Current Meds:  Outpatient Encounter  Prescriptions as of 09/17/2014  Medication Sig  . albuterol (PROVENTIL HFA;VENTOLIN HFA) 108 (90 BASE) MCG/ACT inhaler Inhale 2 puffs into the lungs every 6 (six) hours as needed for wheezing.  Marland Kitchen amLODipine (NORVASC) 2.5 MG tablet Take 1 tablet (2.5 mg total) by mouth daily.  Marland Kitchen aspirin EC 81 MG tablet Take 81 mg by mouth every morning.   . Calcium Carbonate-Vitamin D 600-400 MG-UNIT per tablet Take 1 tablet by mouth.  . Cholecalciferol (VITAMIN D3) 2000 UNITS TABS Take 1 capsule by mouth daily.   . clonazePAM (KLONOPIN) 1 MG tablet Take 1 mg by mouth 3 (three) times daily as needed for anxiety.   . cloNIDine (CATAPRES) 0.2 MG tablet Take 0.2 mg by mouth 2 (two) times daily.   Marland Kitchen conjugated estrogens (PREMARIN) vaginal cream Place 1 Applicatorful vaginally daily.  . cycloSPORINE (RESTASIS) 0.05 % ophthalmic emulsion Place 1 drop into both eyes 2 (two) times daily.  Marland Kitchen docusate sodium (COLACE) 100 MG capsule Take 1 capsule (100 mg total) by mouth 2 (two) times daily.  Marland Kitchen esomeprazole (NEXIUM) 20 MG capsule Take 20 mg by mouth daily before breakfast.  . furosemide (LASIX) 20 MG tablet Take 20 mg by mouth every morning.  . lamoTRIgine (LAMICTAL) 100 MG tablet Take 200 mg by mouth 2 (two) times daily.   . Lubricants (SURGILUBE EX) Place 1 application vaginally daily before supper.   . mometasone-formoterol (DULERA) 100-5 MCG/ACT AERO Inhale 2 puffs into the lungs 2 (two) times daily.  . Multiple Vitamin (MULTIVITAMIN) capsule Take 1 capsule by mouth daily.   Marland Kitchen oxyCODONE-acetaminophen (PERCOCET) 10-325 MG per tablet Take 1 tablet by mouth every 4 (four) hours as needed for pain.   . polyethylene glycol (MIRALAX / GLYCOLAX) packet Take 17 g by mouth daily.  . potassium chloride (KLOR-CON) 8 MEQ tablet Take 8 mEq by mouth daily.  . risperiDONE (RISPERDAL) 3 MG tablet Take 3 mg by mouth at bedtime.  . rosuvastatin (CRESTOR) 5 MG tablet Take 5 mg by mouth at bedtime.  . sertraline (ZOLOFT) 50 MG tablet Take  50 mg by mouth every morning.   . tiotropium (SPIRIVA) 18 MCG inhalation capsule Place 18 mcg into inhaler and inhale daily. EVENING  . [DISCONTINUED] ARIPiprazole (ABILIFY) 15 MG tablet Take 15 mg by mouth every morning.   . [DISCONTINUED] OxyCODONE HCl ER 30 MG T12A Take 30 mg by mouth.  . [DISCONTINUED] simvastatin (ZOCOR) 10 MG tablet Take 10 mg by mouth.   No facility-administered encounter medications on file as of 09/17/2014.    Allergy: No Known Allergies  Social Hx:   Social History   Social History  . Marital Status: Divorced    Spouse Name: N/A  . Number of Children: N/A  . Years of Education: N/A   Occupational History  . Not on file.   Social History Main Topics  . Smoking status: Former Smoker -- 2.00 packs/day for 50 years    Types: Cigarettes    Quit date: 01/18/2011  . Smokeless tobacco: Never Used     Comment: STATES QUIT SMOKING 01-18-2011  . Alcohol Use: No     Comment: RECOVERING  ALCOHOLIC--   QUIT IN 1017  . Drug Use: No  . Sexual Activity: No   Other Topics Concern  . Not on file   Social History Narrative    Past Surgical Hx:  Past Surgical History  Procedure Laterality Date  . Total knee arthroplasty  09-22-2009    RIGHT  . Vaginal hysterectomy  07-06-2009  . Hemiarthroplasty hip  12-26-2008    LEFT FEMORAL NECK FX  . Total hip arthroplasty  04-15-2008    POST FAILED  RIGHT HIP ORIF FEMORAL FX  . Cervical conization w/bx  09-23-2008  . Orif hip fracture  02-13-2007    RIGHT FEMORAL NECK FX  . Right shoulder surg.  2007  . Coronary angioplasty with stent placement  2000-   INFERIOR MI    X1 STENT TO RCA  . Carotid endarterectomy  1995    RIGHT  . Cataract extraction w/ intraocular lens  implant, bilateral    . Orif right distal radius and right proximal humerous neck fx's  10-10-2005  . Upper right vaginal region  12/28/11    BIOPSY: SQUAMOUS CELL CARCINOMA  . Eus N/A 03/05/2012    Procedure: FULL UPPER ENDOSCOPIC ULTRASOUND (EUS)  RADIAL and EGD;  Surgeon: Milus Banister, MD;  Location: [facility];  Service: Endoscopy;  Laterality: N/A;  ercp scope first than eus scope  . Robotic assited partial nephrectomy Left 07/09/2012    Procedure: ROBOTIC ASSITED PARTIAL NEPHRECTOMY;  Surgeon: Dutch Gray, MD;  Location: [facility];  Service: Urology;  Laterality: Left;    Past Medical Hx:  Past Medical History  Diagnosis Date  . Hypertension   . Depression   . Alcoholism     recovering since 2000  . Inferior MI 2000--  POST PTCA W/ STENT X1  . Bipolar disorder   . Peripheral vascular disease POST RIGHT CAROTID SURG.  1995  . Status post carotid endarterectomy RIGHT --  1995  . Status post primary angioplasty with coronary stent 2000--  POST INFERIOR MI  . Blood transfusion   . Normal cardiac stress test 2008-- PER DR Martinique NOTE 09-07-2009  . Arthritis BACK  . Chronic back pain   . Osteoporosis   . Rosacea LEFT FACIAL RASH  . Idiopathic acute facial nerve palsy LEFT SIDE--  BOTOX THERAPY  . Blepharospasm LEFT EYE  . History of alcohol abuse RECOVERING SINCE 2000  . Anxiety   . Unstable balance WALKS W/ CANE  . HTN (hypertension)   . Vaginal cancer   . Cervical cancer 12/28/11     vagina upper right bx==squamous cell ca  . Emphysema   . S/P radiation therapy 02/21/2012    38.75 Gy HDR 5 Fractions- vaginal cuff  . Left renal mass 03/02/12  . Blepharospasm     left  . Seizures     x 1 after abrupt discontinuation of  Clonidine  . PONV (postoperative nausea and vomiting)   . Other and unspecified general anesthetics causing adverse effect in therapeutic use post op delirium--  last anes record w/ chart  from   09-22-2009 (spinal w/ light sedation)  . Coronary artery disease CARDIOLOGIST- DR Martinique--- LAST VISIT NOTE 09-07-2009  W/ CHART  . Renal cell carcinoma 07/09/12    Left mass    Family Hx:  Family History  Problem Relation Age of Onset  . Hypertension Mother   . Hypertension Father     Vitals:  Blood  pressure 105/71, pulse 63, temperature 97.8 F (36.6 C), temperature  source Oral, resp. rate 16, height 5\' 6"  (1.676 m), weight 162 lb 8 oz (73.71 kg), SpO2 96 %.  Physical Exam:  Well-nourished well-developed female in no acute distress.   Neck: Supple, no lymphadenopathy, no thyromegaly.  Lungs: Clear to auscultation bilaterally.  Cardiac: Regular rate and rhythm  Abdomen: Soft, nontender, nondistended. There is no palpable mass or prostatomegaly. Well-healed robotic skin incisions.  Groins: No lymphadenopathy.  Extremities: No edema.  Pelvic: Normal external female genitalia. Vagina is markedly atrophic. Vaginal cuff is visualized is no visible lesions except for radiation telangectasias. Improved vaginal length with dilators. ThinPrep Pap was submitted without difficulty. Bimanual examination reveals no masses or nodularity. Rectal confirms.  Assessment/Plan: 69 year old with a clinical stage I vaginal carcinoma treated with high-dose rate vaginal brachyherapy. She completed her treatment on February 21, 2012.  We'll followup in results for Pap smear from today. She will see Dr. Lanell Persons  in 4 months and return to see me in 8 months. By that time of the greater than 3 years since she completed her treatment and we'll go to every 6 month appointments.  Takeira Yanes A., MD 09/17/2014, 12:08 PM

## 2014-09-17 NOTE — Patient Instructions (Addendum)
Follow up with Dr. Lanell Persons in 4 months and Dr. Alycia Rossetti in 8 months.  Please call closer to the date to schedule your appt with Dr. Alycia Rossetti.

## 2014-09-23 ENCOUNTER — Telehealth: Payer: Self-pay | Admitting: Nurse Practitioner

## 2014-09-23 LAB — CYTOLOGY - PAP

## 2014-09-23 NOTE — Telephone Encounter (Signed)
Per [name], NP, patient informed of normal PAP. She verbalizes understanding and is aware to call clinic with any questions or concerns.

## 2014-09-26 DIAGNOSIS — Z96651 Presence of right artificial knee joint: Secondary | ICD-10-CM | POA: Diagnosis not present

## 2014-09-26 DIAGNOSIS — M25561 Pain in right knee: Secondary | ICD-10-CM | POA: Diagnosis not present

## 2014-09-26 DIAGNOSIS — Z471 Aftercare following joint replacement surgery: Secondary | ICD-10-CM | POA: Diagnosis not present

## 2014-10-13 DIAGNOSIS — M5136 Other intervertebral disc degeneration, lumbar region: Secondary | ICD-10-CM | POA: Diagnosis not present

## 2014-10-13 DIAGNOSIS — R490 Dysphonia: Secondary | ICD-10-CM | POA: Diagnosis not present

## 2014-10-13 DIAGNOSIS — Z23 Encounter for immunization: Secondary | ICD-10-CM | POA: Diagnosis not present

## 2014-10-13 DIAGNOSIS — R2689 Other abnormalities of gait and mobility: Secondary | ICD-10-CM | POA: Diagnosis not present

## 2014-10-13 DIAGNOSIS — K219 Gastro-esophageal reflux disease without esophagitis: Secondary | ICD-10-CM | POA: Diagnosis not present

## 2014-10-13 DIAGNOSIS — I10 Essential (primary) hypertension: Secondary | ICD-10-CM | POA: Diagnosis not present

## 2014-10-13 DIAGNOSIS — F319 Bipolar disorder, unspecified: Secondary | ICD-10-CM | POA: Diagnosis not present

## 2014-10-16 DIAGNOSIS — L669 Cicatricial alopecia, unspecified: Secondary | ICD-10-CM | POA: Diagnosis not present

## 2014-10-17 ENCOUNTER — Encounter: Payer: Self-pay | Admitting: Gastroenterology

## 2014-10-20 DIAGNOSIS — H02402 Unspecified ptosis of left eyelid: Secondary | ICD-10-CM | POA: Diagnosis not present

## 2014-10-28 DIAGNOSIS — M5136 Other intervertebral disc degeneration, lumbar region: Secondary | ICD-10-CM | POA: Diagnosis not present

## 2014-10-28 DIAGNOSIS — G894 Chronic pain syndrome: Secondary | ICD-10-CM | POA: Diagnosis not present

## 2014-10-28 DIAGNOSIS — Z79891 Long term (current) use of opiate analgesic: Secondary | ICD-10-CM | POA: Diagnosis not present

## 2014-10-31 ENCOUNTER — Ambulatory Visit: Payer: Medicare Other | Admitting: Radiation Oncology

## 2014-11-11 DIAGNOSIS — Z79891 Long term (current) use of opiate analgesic: Secondary | ICD-10-CM | POA: Diagnosis not present

## 2014-11-11 DIAGNOSIS — G894 Chronic pain syndrome: Secondary | ICD-10-CM | POA: Diagnosis not present

## 2014-11-11 DIAGNOSIS — M5136 Other intervertebral disc degeneration, lumbar region: Secondary | ICD-10-CM | POA: Diagnosis not present

## 2014-11-25 DIAGNOSIS — G894 Chronic pain syndrome: Secondary | ICD-10-CM | POA: Diagnosis not present

## 2014-11-25 DIAGNOSIS — Z79891 Long term (current) use of opiate analgesic: Secondary | ICD-10-CM | POA: Diagnosis not present

## 2014-11-25 DIAGNOSIS — M5136 Other intervertebral disc degeneration, lumbar region: Secondary | ICD-10-CM | POA: Diagnosis not present

## 2014-12-08 DIAGNOSIS — G518 Other disorders of facial nerve: Secondary | ICD-10-CM | POA: Diagnosis not present

## 2014-12-09 DIAGNOSIS — G894 Chronic pain syndrome: Secondary | ICD-10-CM | POA: Diagnosis not present

## 2014-12-09 DIAGNOSIS — Z79891 Long term (current) use of opiate analgesic: Secondary | ICD-10-CM | POA: Diagnosis not present

## 2014-12-09 DIAGNOSIS — H04123 Dry eye syndrome of bilateral lacrimal glands: Secondary | ICD-10-CM | POA: Diagnosis not present

## 2014-12-09 DIAGNOSIS — M5136 Other intervertebral disc degeneration, lumbar region: Secondary | ICD-10-CM | POA: Diagnosis not present

## 2015-01-02 ENCOUNTER — Encounter: Payer: Self-pay | Admitting: Radiation Oncology

## 2015-01-02 ENCOUNTER — Ambulatory Visit: Admission: RE | Admit: 2015-01-02 | Payer: Medicare Other | Source: Ambulatory Visit

## 2015-01-02 ENCOUNTER — Ambulatory Visit
Admission: RE | Admit: 2015-01-02 | Discharge: 2015-01-02 | Disposition: A | Discharge status: Home / Self Care | Payer: Medicare Other | Source: Ambulatory Visit | Attending: Radiation Oncology | Admitting: Radiation Oncology

## 2015-01-02 VITALS — BP 120/70 | HR 65 | Temp 97.6°F | Ht 66.0 in | Wt 159.8 lb

## 2015-01-02 DIAGNOSIS — C52 Malignant neoplasm of vagina: Secondary | ICD-10-CM

## 2015-01-02 NOTE — Progress Notes (Signed)
Radiation Oncology         [phone] ________________________________  Name: [name] MRN: [id number]  Date: 01/02/2015  DOB: May 29, 1945  Follow-Up Visit Note  Outpatient    ICD-9-CM ICD-10-CM   1. Cancer of vaginal vault - recurrent from cervix 184.0 C52 Wound culture     CC: Vidal Schwalbe, MD  Nancy Marus, MD  Diagnosis and Prior Radiotherapy:   Squamous cell carcinoma at the vaginal cuff - recurrent cervical cancer presenting as stage I vaginal cancer  She is status post 38.75 Gray in 5 fractions, high dose rate vaginal cuff brachytherapy, 5.5 cm length of proximal vagina treated. She completed treatment on 02/21/2012  Narrative:  The patient returns today for routine follow-up of radiation completed 02/21/12 to her vaginal area. She reports pain in her back which is chronic in nature, which she rates a 7/10 today, while she is sitting down. She reports no fatigue, and likes to walk when the weather is warmer. She reports a good appetite. She reports constipation issues and is taking laxatives and has a bowel movement every other day. She reports some urinary incontinence, and has recently been put on a medicine by Dr. Alinda Money to help her. She reports that this medicine is not helping and she continues to wear Depends. Denies blood in stool. She is using vaginal dilator weekly, and also denies any vaginal discharge or bleeding. She had a negative PAP smear on August 31, by Dr. Alycia Rossetti. She intends to follow up with Dr. Alycia Rossetti in April. She has no other concerns at this time.                           ALLERGIES:  has No Known Allergies.  Meds: Current Outpatient Prescriptions  Medication Sig Dispense Refill  . albuterol (PROVENTIL HFA;VENTOLIN HFA) 108 (90 BASE) MCG/ACT inhaler Inhale 2 puffs into the lungs every 6 (six) hours as needed for wheezing.    Marland Kitchen amLODipine (NORVASC) 2.5 MG tablet Take 1 tablet (2.5 mg total) by mouth daily. 30 tablet 11  . aspirin EC 81 MG  tablet Take 81 mg by mouth every morning.     . Calcium Carbonate-Vitamin D 600-400 MG-UNIT per tablet Take 1 tablet by mouth.    . Cholecalciferol (VITAMIN D3) 2000 UNITS TABS Take 1 capsule by mouth daily.     . clonazePAM (KLONOPIN) 1 MG tablet Take 1 mg by mouth 3 (three) times daily as needed for anxiety.     . cloNIDine (CATAPRES) 0.2 MG tablet Take 0.2 mg by mouth 2 (two) times daily.     Marland Kitchen conjugated estrogens (PREMARIN) vaginal cream Place 1 Applicatorful vaginally daily.    . cycloSPORINE (RESTASIS) 0.05 % ophthalmic emulsion Place 1 drop into both eyes 2 (two) times daily.    Marland Kitchen docusate sodium (COLACE) 100 MG capsule Take 1 capsule (100 mg total) by mouth 2 (two) times daily. 30 capsule 0  . esomeprazole (NEXIUM) 20 MG capsule Take 20 mg by mouth daily before breakfast.    . furosemide (LASIX) 20 MG tablet Take 20 mg by mouth every morning.    . lamoTRIgine (LAMICTAL) 100 MG tablet Take 200 mg by mouth 2 (two) times daily.     . Lubricants (SURGILUBE EX) Place 1 application vaginally daily before supper.     . mometasone-formoterol (DULERA) 100-5 MCG/ACT AERO Inhale 2 puffs into the lungs 2 (two) times daily. 1 Inhaler 3  . Multiple Vitamin (  MULTIVITAMIN) capsule Take 1 capsule by mouth daily.     Marland Kitchen oxyCODONE-acetaminophen (PERCOCET) 10-325 MG per tablet Take 1 tablet by mouth every 4 (four) hours as needed for pain.     . polyethylene glycol (MIRALAX / GLYCOLAX) packet Take 17 g by mouth daily.    . potassium chloride (KLOR-CON) 8 MEQ tablet Take 8 mEq by mouth daily.    . risperiDONE (RISPERDAL) 3 MG tablet Take 3 mg by mouth at bedtime.    . rosuvastatin (CRESTOR) 5 MG tablet Take 5 mg by mouth at bedtime.    . sertraline (ZOLOFT) 50 MG tablet Take 50 mg by mouth every morning.     . tiotropium (SPIRIVA) 18 MCG inhalation capsule Place 18 mcg into inhaler and inhale daily. EVENING     No current facility-administered medications for this encounter.    Physical Findings: The  patient is in no acute distress. Patient is alert and oriented.  height is 5\' 6"  (1.676 m) and weight is 159 lb 12.8 oz (72.485 kg). Her temperature is 97.6 F (36.4 C). Her blood pressure is 120/70 and her pulse is 65. Marland Kitchen   She has pitting edema in her ankles bilaterally.  She has some mild left lower quadrant tenderness in the abdomen. Abdomen is soft and non distended.  Groin is without adenopathy. External genitalia and vaginal vault without lesions or sign of recurrent disease on palpation and by speculum exam.  Skin exam demonstrates a quarter sized area of purpleish erythema with central drainage of purulent liquid. This is at the inferior aspect of the right buttock lateral to the perineum. This area was swabbed for culture. Small hole where discharge emits. Nursing took a photograph of this area.    Lab Findings: Lab Results  Component Value Date   WBC 5.6 07/02/2012   HGB 9.7* 07/12/2012   HCT 28.7* 07/12/2012   MCV 87.3 07/02/2012   PLT 323 07/02/2012    Radiographic Findings: No results found.  Impression/Plan: Ms. Bernal will follow up with Dr. Alycia Rossetti in April. We will follow up in August. She has what appears to be a boil on exam today; the specimen will be cultured. I will contact [facility] and Dr. Alycia Rossetti to verify if this area can possibly be examined further in their clinic. We will send them a photo to determine if they would advise lancing the area in their clinic or rather advise the patient to take less aggressive measures as an outpatient for managing this area. No evidence of neoplastic disease on exam today.     Eppie Gibson, MD   This document serves as a record of services personally performed by Eppie Gibson, MD. It was created on her behalf by Arlyce Harman, a trained medical scribe. The creation of this record is based on the scribe's personal observations and the provider's statements to them. This document has been checked and approved by the  attending provider.

## 2015-01-02 NOTE — Progress Notes (Signed)
Ms. Pond presents for follow up of radiation completed 02/21/12 to her vaginal area. She reports pain in her back which is chronic in nature, which she rates a 7/10 today, while she is sitting down. She reports no fatigue, and likes to walk when the weather is warmer.  She reports a good appetite. She reports constipation issues and is taking laxatives and has a bowel every other day. She reports some urinary incontinence, and has recently been put on a medicine by Dr. Alinda Money to help her. She is using vaginal dilator weekly, and also denies any vaginal discharge. She had a negative PAP smear on August 31, by Dr. Syble Creek. She has no other concerns at this time.   BP 120/70 mmHg  Pulse 65  Temp(Src) 97.6 F (36.4 C)  Ht 5\' 6"  (1.676 m)  Wt 159 lb 12.8 oz (72.485 kg)  BMI 25.80 kg/m2   Wt Readings from Last 3 Encounters:  01/02/15 159 lb 12.8 oz (72.485 kg)  09/17/14 162 lb 8 oz (73.71 kg)  03/21/14 172 lb 6.4 oz (78.2 kg)

## 2015-01-04 LAB — WOUND CULTURE

## 2015-01-05 ENCOUNTER — Encounter: Payer: Self-pay | Admitting: Gynecologic Oncology

## 2015-01-05 ENCOUNTER — Ambulatory Visit: Payer: Medicare Other | Attending: Gynecologic Oncology | Admitting: Gynecologic Oncology

## 2015-01-05 VITALS — BP 143/78 | HR 64 | Temp 98.2°F | Resp 18 | Ht 66.0 in | Wt 158.0 lb

## 2015-01-05 DIAGNOSIS — N9089 Other specified noninflammatory disorders of vulva and perineum: Secondary | ICD-10-CM

## 2015-01-05 DIAGNOSIS — N7689 Other specified inflammation of vagina and vulva: Secondary | ICD-10-CM | POA: Insufficient documentation

## 2015-01-05 DIAGNOSIS — C52 Malignant neoplasm of vagina: Secondary | ICD-10-CM

## 2015-01-05 NOTE — Patient Instructions (Signed)
Follow up in 4 months or sooner with any changes , follow up scheduled in April 2017 .   Thank you !

## 2015-01-05 NOTE — Progress Notes (Signed)
Follow up Note: Gyn-Onc  [name] 69 y.o. female  CC:  Chief Complaint  Patient presents with  . Vaginal Cancer    Follow up  Right buttock carruncle  HPI: [name] is a very pleasant 53 year old with a 10 year history of abnormal Pap smears. In April 2011 she had a Pap smear revealed atypical squamous cells cannot exclude a high-grade dysplasia. She was worked up and evaluated and subsequently underwent a vaginal hysterectomy in June of 2011. Pathology at that time revealed the cervix with high-grade squamous dysplasia. Focal adenomyosis. In addition, the ectocervical margin of the cervix was positive. She was seen by Dr. Deatra Ina on October 29 at which time a Pap smear revealed VAIN2/VAIN 3.   She underwent laser ablation on for dysplasia on February 07, 2010. Operative findings included a raised hyperkeratotic lesion in the vagina encompassing the entire vaginal cuff. The area was acetowhite changing. The total area measured approximately 4 x 4 centimeters.  I saw her in 2013 and her Pap smear was normal. She subsequently saw Dr. Deatra Ina October 30, 13 and her Pap smear returned as atypical squamous cells of undetermined significance. The patient has positive high-risk HPV.  I saw her in late 2013 acetowhite epithelial changes at the top of the vagina. However, there was a punctate area with telangiectasias in the upper right vaginal fornix. A biopsy of this was performed. It revealed a squamous cell carcinoma.   She completed 5 fractions of HDR with Dr. Lanell Persons in 2/14. She was ultimately diagnosed with a renal cell carcinoma and is status post a partial left nephrectomy 07/09/2012. The pathology revealed a T1-1 grade 2 clear cell renal cell carcinoma. The margins were negative and she was dispositioned to close followup. She comes in today for followup Pap smear. Pap smear performed in May of 2014 revealed atypical squamous cells of undetermined significance. However, she just  finished her brachytherapy on February 4. I last saw her 1/16 at which time her exam and Pap smear were negative. She has been using her vaginal dilators and her vaginal Premarin cream. She saw Dr. Lanell Persons in 3/16 and had a negative exam.   Interval History: She comes in today for unscheduled followup due to development of a right buttock caruncle. It has been present for 1 month, stable, minimally tender, not increasing, no drainage.   She does have some constipation from the narcotics but is well-controlled with Miralax. She has some shortness of breath with activity but no cough. She recently had a negative chest x-ray as follow-up for her renal cell carcinoma and that was unremarkable. Review of systems is otherwise as below. She has intentionally lost 7 pounds with walking as well as try to decrease the suite in her diet. She is up-to-date on her mammograms. There are no new medical problems in her family. She is still diligently using her Premarin cream and vaginal dilators.  Pap in September 2016 was negative.  Review of Systems  Constitutional: No complaints, weight is down Skin: see HPI Cardiovascular: No chest pain, Occ sob, no edema  Pulmonary: No cough  Gastro Intestinal: No nausea, vomiting, + constipation, no diarrhea reported. No bright red blood per rectum or change in bowel movement.  Genitourinary: No frequency, urgency, or dysuria.  Denies vaginal bleeding and discharge.  Using dilators 3x/week. Musculoskeletal: + Back pain which radiates to her legs bilaterally  Neurologic: No changes Psychology: No issues   Current Meds:  Outpatient Encounter Prescriptions as of 01/05/2015  Medication Sig  . albuterol (PROVENTIL HFA;VENTOLIN HFA) 108 (90 BASE) MCG/ACT inhaler Inhale 2 puffs into the lungs every 6 (six) hours as needed for wheezing.  Marland Kitchen amLODipine (NORVASC) 2.5 MG tablet Take 1 tablet (2.5 mg total) by mouth daily.  Marland Kitchen aspirin EC 81 MG tablet Take 81 mg by mouth every  morning.   . Calcium Carbonate-Vitamin D 600-400 MG-UNIT per tablet Take 1 tablet by mouth.  . Cholecalciferol (VITAMIN D3) 2000 UNITS TABS Take 1 capsule by mouth daily.   . clonazePAM (KLONOPIN) 1 MG tablet Take 1 mg by mouth 3 (three) times daily as needed for anxiety.   . cloNIDine (CATAPRES) 0.2 MG tablet Take 0.2 mg by mouth 2 (two) times daily.   Marland Kitchen conjugated estrogens (PREMARIN) vaginal cream Place 1 Applicatorful vaginally daily.  . cycloSPORINE (RESTASIS) 0.05 % ophthalmic emulsion Place 1 drop into both eyes 2 (two) times daily.  Marland Kitchen docusate sodium (COLACE) 100 MG capsule Take 1 capsule (100 mg total) by mouth 2 (two) times daily.  Marland Kitchen esomeprazole (NEXIUM) 20 MG capsule Take 20 mg by mouth daily before breakfast.  . furosemide (LASIX) 20 MG tablet Take 20 mg by mouth every morning.  . lamoTRIgine (LAMICTAL) 100 MG tablet Take 200 mg by mouth 2 (two) times daily.   . Lubricants (SURGILUBE EX) Place 1 application vaginally daily before supper.   . mometasone-formoterol (DULERA) 100-5 MCG/ACT AERO Inhale 2 puffs into the lungs 2 (two) times daily.  . Multiple Vitamin (MULTIVITAMIN) capsule Take 1 capsule by mouth daily.   Marland Kitchen oxyCODONE-acetaminophen (PERCOCET) 10-325 MG per tablet Take 1 tablet by mouth every 4 (four) hours as needed for pain.   . polyethylene glycol (MIRALAX / GLYCOLAX) packet Take 17 g by mouth daily.  . potassium chloride (KLOR-CON) 8 MEQ tablet Take 8 mEq by mouth daily.  . risperiDONE (RISPERDAL) 3 MG tablet Take 3 mg by mouth at bedtime.  . rosuvastatin (CRESTOR) 5 MG tablet Take 5 mg by mouth at bedtime.  . sertraline (ZOLOFT) 50 MG tablet Take 50 mg by mouth every morning.   . tiotropium (SPIRIVA) 18 MCG inhalation capsule Place 18 mcg into inhaler and inhale daily. EVENING   No facility-administered encounter medications on file as of 01/05/2015.    Allergy: No Known Allergies  Social Hx:   Social History   Social History  . Marital Status: Divorced     Spouse Name: N/A  . Number of Children: N/A  . Years of Education: N/A   Occupational History  . Not on file.   Social History Main Topics  . Smoking status: Former Smoker -- 2.00 packs/day for 50 years    Types: Cigarettes    Quit date: 01/18/2011  . Smokeless tobacco: Never Used     Comment: STATES QUIT SMOKING 01-18-2011  . Alcohol Use: No     Comment: RECOVERING ALCOHOLIC--   QUIT IN [id number]  . Drug Use: No  . Sexual Activity: No   Other Topics Concern  . Not on file   Social History Narrative    Past Surgical Hx:  Past Surgical History  Procedure Laterality Date  . Total knee arthroplasty  09-22-2009    RIGHT  . Vaginal hysterectomy  07-06-2009  . Hemiarthroplasty hip  12-26-2008    LEFT FEMORAL NECK FX  . Total hip arthroplasty  04-15-2008    POST FAILED  RIGHT HIP ORIF FEMORAL FX  . Cervical conization w/bx  09-23-2008  . Orif hip fracture  02-13-2007  RIGHT FEMORAL NECK FX  . Right shoulder surg.  2007  . Coronary angioplasty with stent placement  2000-   INFERIOR MI    X1 STENT TO RCA  . Carotid endarterectomy  1995    RIGHT  . Cataract extraction w/ intraocular lens  implant, bilateral    . Orif right distal radius and right proximal humerous neck fx's  10-10-2005  . Upper right vaginal region  12/28/11    BIOPSY: SQUAMOUS CELL CARCINOMA  . Eus N/A 03/05/2012    Procedure: FULL UPPER ENDOSCOPIC ULTRASOUND (EUS) RADIAL and EGD;  Surgeon: Milus Banister, MD;  Location: [facility];  Service: Endoscopy;  Laterality: N/A;  ercp scope first than eus scope  . Robotic assited partial nephrectomy Left 07/09/2012    Procedure: ROBOTIC ASSITED PARTIAL NEPHRECTOMY;  Surgeon: Dutch Gray, MD;  Location: [facility];  Service: Urology;  Laterality: Left;    Past Medical Hx:  Past Medical History  Diagnosis Date  . Hypertension   . Depression   . Alcoholism ([place])     recovering since 2000  . Inferior MI ([place]) 2000--  POST PTCA W/ STENT X1  . Bipolar disorder ([place])    . Peripheral vascular disease ([place]) POST RIGHT CAROTID SURG.  1995  . Status post carotid endarterectomy RIGHT --  1995  . Status post primary angioplasty with coronary stent 2000--  POST INFERIOR MI  . Blood transfusion   . Normal cardiac stress test 2008-- PER DR [place] NOTE 09-07-2009  . Arthritis BACK  . Chronic back pain   . Osteoporosis   . Rosacea LEFT FACIAL RASH  . Idiopathic acute facial nerve palsy LEFT SIDE--  BOTOX THERAPY  . Blepharospasm LEFT EYE  . History of alcohol abuse RECOVERING SINCE 2000  . Anxiety   . Unstable balance WALKS W/ CANE  . HTN (hypertension)   . Vaginal cancer ([place])   . Cervical cancer ([place]) 12/28/11     vagina upper right bx==squamous cell ca  . Emphysema   . S/P radiation therapy 02/21/2012    38.75 Gy HDR 5 Fractions- vaginal cuff  . Left renal mass 03/02/12  . Blepharospasm     left  . Seizures ([place])     x 1 after abrupt discontinuation of  Clonidine  . PONV (postoperative nausea and vomiting)   . Other and unspecified general anesthetics causing adverse effect in therapeutic use post op delirium--  last anes record w/ chart  from   09-22-2009 (spinal w/ light sedation)  . Coronary artery disease CARDIOLOGIST- DR [place]--- LAST VISIT NOTE 09-07-2009  W/ CHART  . Renal cell carcinoma ([place]) 07/09/12    Left mass    Family Hx:  Family History  Problem Relation Age of Onset  . Hypertension Mother   . Hypertension Father     Vitals:  Blood pressure 143/78, pulse 64, temperature 98.2 F (36.8 C), temperature source Oral, resp. rate 18, height 5\' 6"  (1.676 m), weight 158 lb (71.668 kg), SpO2 98 %.  Physical Exam:  Well-nourished well-developed female in no acute distress.   Neck: Supple, no lymphadenopathy, no thyromegaly.  Lungs: Clear to auscultation bilaterally.  Cardiac: Regular rate and rhythm  Abdomen: Soft, nontender, nondistended. There is no palpable mass or prostatomegaly. Well-healed robotic skin incisions.  Groins: No  lymphadenopathy.  Extremities: No edema.  Pelvic: Normal external female genitalia. Vagina is markedly atrophic. Vaginal cuff is visualized is no visible lesions except for radiation telangectasias. Improved vaginal length with dilators. Bimanual examination  reveals no masses or nodularity. Rectal confirms.  Buttock: right buttock/labiocrural fold 1cm caruncle. Slightly fluctuant. No surrounding erythema or cellulitis.  PROCEDURE NOTE: I&D The patient provided consent. Betadine was applied to the skin. The critical was infiltrated with 3 mL of 1% plain lidocaine. An 11 blade scalpel was utilized to lancets incision in the caruncle overlying the fluctuant area. It was probed and cleaned with a sterile Q-tip. Hemostasis was achieved with silver nitrate sticks at the skin edges. The base of the [place] was packed with quarter inch iodoform course strips and her partners instructed regarding how to perform this twice a day. The patient tolerated the procedure well.  Assessment/Plan: 69 year old with a clinical stage I vaginal carcinoma treated with high-dose rate vaginal brachyherapy. She completed her treatment on February 21, 2012.  S/p I&D of buttock caruncle today. Advised to return for symptoms concerning for infection. Return to see Dr. Alycia Rossetti  in 4 months for routine surveillance. By that time of the greater than 3 years since she completed her treatment and we'll go to every 6 month appointments.  Donaciano Eva, MD 01/05/2015, 1:44 PM

## 2015-01-15 DIAGNOSIS — L638 Other alopecia areata: Secondary | ICD-10-CM | POA: Diagnosis not present

## 2015-01-22 DIAGNOSIS — I10 Essential (primary) hypertension: Secondary | ICD-10-CM | POA: Diagnosis not present

## 2015-01-22 DIAGNOSIS — R609 Edema, unspecified: Secondary | ICD-10-CM | POA: Diagnosis not present

## 2015-02-04 DIAGNOSIS — Z79891 Long term (current) use of opiate analgesic: Secondary | ICD-10-CM | POA: Diagnosis not present

## 2015-02-04 DIAGNOSIS — G894 Chronic pain syndrome: Secondary | ICD-10-CM | POA: Diagnosis not present

## 2015-02-04 DIAGNOSIS — M5136 Other intervertebral disc degeneration, lumbar region: Secondary | ICD-10-CM | POA: Diagnosis not present

## 2015-02-06 DIAGNOSIS — C642 Malignant neoplasm of left kidney, except renal pelvis: Secondary | ICD-10-CM | POA: Diagnosis not present

## 2015-02-06 DIAGNOSIS — N3942 Incontinence without sensory awareness: Secondary | ICD-10-CM | POA: Diagnosis not present

## 2015-02-06 DIAGNOSIS — Z0000 Encounter for general adult medical examination without abnormal findings: Secondary | ICD-10-CM | POA: Diagnosis not present

## 2015-02-12 DIAGNOSIS — F319 Bipolar disorder, unspecified: Secondary | ICD-10-CM | POA: Diagnosis not present

## 2015-02-12 DIAGNOSIS — I10 Essential (primary) hypertension: Secondary | ICD-10-CM | POA: Diagnosis not present

## 2015-02-12 DIAGNOSIS — E785 Hyperlipidemia, unspecified: Secondary | ICD-10-CM | POA: Diagnosis not present

## 2015-02-12 DIAGNOSIS — K219 Gastro-esophageal reflux disease without esophagitis: Secondary | ICD-10-CM | POA: Diagnosis not present

## 2015-02-12 DIAGNOSIS — R490 Dysphonia: Secondary | ICD-10-CM | POA: Diagnosis not present

## 2015-02-19 DIAGNOSIS — M5136 Other intervertebral disc degeneration, lumbar region: Secondary | ICD-10-CM | POA: Diagnosis not present

## 2015-02-19 DIAGNOSIS — G894 Chronic pain syndrome: Secondary | ICD-10-CM | POA: Diagnosis not present

## 2015-02-19 DIAGNOSIS — Z79891 Long term (current) use of opiate analgesic: Secondary | ICD-10-CM | POA: Diagnosis not present

## 2015-03-02 ENCOUNTER — Ambulatory Visit (INDEPENDENT_AMBULATORY_CARE_PROVIDER_SITE_OTHER): Payer: Medicare Other | Admitting: Cardiology

## 2015-03-02 ENCOUNTER — Encounter: Payer: Self-pay | Admitting: Cardiology

## 2015-03-02 VITALS — BP 152/86 | HR 66 | Ht 63.0 in | Wt 153.3 lb

## 2015-03-02 DIAGNOSIS — I739 Peripheral vascular disease, unspecified: Principal | ICD-10-CM

## 2015-03-02 DIAGNOSIS — I10 Essential (primary) hypertension: Secondary | ICD-10-CM | POA: Diagnosis not present

## 2015-03-02 DIAGNOSIS — I779 Disorder of arteries and arterioles, unspecified: Secondary | ICD-10-CM | POA: Diagnosis not present

## 2015-03-02 DIAGNOSIS — I25118 Atherosclerotic heart disease of native coronary artery with other forms of angina pectoris: Secondary | ICD-10-CM | POA: Diagnosis not present

## 2015-03-02 MED ORDER — NITROGLYCERIN 0.4 MG SL SUBL: 0.4000 mg | SUBLINGUAL_TABLET | SUBLINGUAL | Status: DC | PRN

## 2015-03-02 NOTE — Progress Notes (Signed)
[address] [place], [place]  [address] Phone: [phone] Fax:  [phone]  Date:  03/02/2015   ID:  [name], DOB [birth year], MRN [id number]  PCP:  Vidal Schwalbe, MD  Cardiologist:  Dr. Faun Mcqueen [place]     History of Present Illness: [name] is a 70 y.o. female with a hx of CAD, s/p inferior MI in 2000 treated with stent to the RCA, carotid stenosis s/p R CEA in 1995, renal CA s/p partial L nephrectomy in 06/2012, HTN, HL, COPD, blepharospasm. Last seen in Dec. 2014.  LexiScan Myoview (02/2011): No ischemia, mild apical thinning, EF 69%, normal study.  Echocardiogram (06/2012): EF 60-65%, normal wall motion, mild LAE, PASP 38.  Carotid US (03/2012): RICA 40-59%.  On follow up today she notes some intermittent pain in left chest. It is hard for her to describe. Just feels "tight". Not with exertion. Reports relief with clonazepam. Her overall medical condition has deteriorated considerably since her last visit. Very poor memory- her friend today supplies most of history. Worsening tremor and dysarthria. She is sedentary. Labs followed by primary care.   Recent Labs: No results found for requested labs within last 365 days.  Wt Readings from Last 3 Encounters:  03/02/15 69.542 kg (153 lb 5 oz)  01/05/15 71.668 kg (158 lb)  01/02/15 72.485 kg (159 lb 12.8 oz)     Past Medical History  Diagnosis Date  . Hypertension   . Depression   . Alcoholism ([place])     recovering since 2000  . Inferior MI ([place]) 2000--  POST PTCA W/ STENT X1  . Bipolar disorder ([place])   . Peripheral vascular disease ([place]) POST RIGHT CAROTID SURG.  1995  . Status post carotid endarterectomy RIGHT --  1995  . Status post primary angioplasty with coronary stent 2000--  POST INFERIOR MI  . Blood transfusion   . Normal cardiac stress test 2008-- PER DR Martin[place] NOTE 09-07-2009  . Arthritis BACK  . Chronic back pain   . Osteoporosis   . Rosacea LEFT FACIAL RASH  . Idiopathic acute facial  nerve palsy LEFT SIDE--  BOTOX THERAPY  . Blepharospasm LEFT EYE  . History of alcohol abuse RECOVERING SINCE 2000  . Anxiety   . Unstable balance WALKS W/ CANE  . HTN (hypertension)   . Vaginal cancer ([place])   . Cervical cancer ([place]) 12/28/11     vagina upper right bx==squamous cell ca  . Emphysema   . S/P radiation therapy 02/21/2012    38.75 Gy HDR 5 Fractions- vaginal cuff  . Left renal mass 03/02/12  . Blepharospasm     left  . Seizures ([place])     x 1 after abrupt discontinuation of  Clonidine  . PONV (postoperative nausea and vomiting)   . Other and unspecified general anesthetics causing adverse effect in therapeutic use post op delirium--  last anes record w/ chart  from   09-22-2009 (spinal w/ light sedation)  . Coronary artery disease CARDIOLOGIST- DR Martinique--- LAST VISIT NOTE 09-07-2009  W/ CHART  . Renal cell carcinoma ([place]) 07/09/12    Left mass    Current Outpatient Prescriptions  Medication Sig Dispense Refill  . albuterol (PROVENTIL HFA;VENTOLIN HFA) 108 (90 BASE) MCG/ACT inhaler Inhale 2 puffs into the lungs every 6 (six) hours as needed for wheezing.    Marland Kitchen amLODipine (NORVASC) 2.5 MG tablet Take 1 tablet (2.5 mg total) by mouth daily. 30 tablet 11  . aspirin EC 81  MG tablet Take 81 mg by mouth every morning.     . Calcium Carbonate-Vitamin D 600-400 MG-UNIT per tablet Take 1 tablet by mouth.    . Cholecalciferol (VITAMIN D3) 2000 UNITS TABS Take 1 capsule by mouth daily.     . clonazePAM (KLONOPIN) 1 MG tablet Take 1 mg by mouth 3 (three) times daily as needed for anxiety.     . cloNIDine (CATAPRES) 0.2 MG tablet Take 0.2 mg by mouth 2 (two) times daily.     Marland Kitchen conjugated estrogens (PREMARIN) vaginal cream Place 1 Applicatorful vaginally daily.    . cycloSPORINE (RESTASIS) 0.05 % ophthalmic emulsion Place 1 drop into both eyes 2 (two) times daily.    Marland Kitchen docusate sodium (COLACE) 100 MG capsule Take 1 capsule (100 mg total) by mouth 2 (two) times daily. 30 capsule 0  .  esomeprazole (NEXIUM) 20 MG capsule Take 20 mg by mouth daily before breakfast.    . furosemide (LASIX) 20 MG tablet Take 20 mg by mouth every morning.    . lamoTRIgine (LAMICTAL) 100 MG tablet Take 200 mg by mouth 2 (two) times daily.     . Lubricants (SURGILUBE EX) Place 1 application vaginally daily before supper.     . mometasone-formoterol (DULERA) 100-5 MCG/ACT AERO Inhale 2 puffs into the lungs 2 (two) times daily. 1 Inhaler 3  . Multiple Vitamin (MULTIVITAMIN) capsule Take 1 capsule by mouth daily.     Marland Kitchen oxyCODONE-acetaminophen (PERCOCET) 10-325 MG per tablet Take 1 tablet by mouth every 4 (four) hours as needed for pain.     . polyethylene glycol (MIRALAX / GLYCOLAX) packet Take 17 g by mouth daily.    . potassium chloride (KLOR-CON) 8 MEQ tablet Take 8 mEq by mouth daily.    . risperiDONE (RISPERDAL) 3 MG tablet Take 3 mg by mouth at bedtime.    . rosuvastatin (CRESTOR) 5 MG tablet Take 5 mg by mouth at bedtime.    . sertraline (ZOLOFT) 50 MG tablet Take 50 mg by mouth every morning.     . tamsulosin (FLOMAX) 0.4 MG CAPS capsule Take 0.4 mg by mouth daily.    Marland Kitchen tiotropium (SPIRIVA) 18 MCG inhalation capsule Place 18 mcg into inhaler and inhale daily. EVENING    . nitroGLYCERIN (NITROSTAT) 0.4 MG SL tablet Place 1 tablet (0.4 mg total) under the tongue every 5 (five) minutes as needed for chest pain. 90 tablet 3   No current facility-administered medications for this visit.    Allergies:   Review of patient's allergies indicates no known allergies.   Social History:  The patient  reports that she quit smoking about 4 years ago. Her smoking use included Cigarettes. She has a 100 pack-year smoking history. She has never used smokeless tobacco. She reports that she does not drink alcohol or use illicit drugs.   Family History:  The patient's family history includes Hypertension in her father and mother.   ROS:  Please see the history of present illness.   All other systems reviewed and  negative.   PHYSICAL EXAM: VS:  BP 152/86 mmHg  Pulse 66  Ht 5\' 3"  (1.6 m)  Wt 69.542 kg (153 lb 5 oz)  BMI 27.16 kg/m2 Well nourished, well developed, in no acute distress HEENT: normal Neck: no JVD Cardiac:  normal S1, S2; RRR; 1/6 systolic murmur at RUSB Lungs:  clear to auscultation bilaterally, no wheezing, rhonchi or rales Abd: soft, nontender, no hepatomegaly Ext: no edema Skin: warm and dry Neuro:  CNs 2-12 intact, no focal abnormalities noted but significant memory loss and tremor noted.  EKG:  Sinus HR 66, normal axis, Normal. I have personally reviewed and interpreted this study.   ASSESSMENT AND PLAN:  1. CAD:  Mild atypical chest pain. Doubt angina. Given significant decline in overall medical condition I would not pursue further evaluation at this time. Given SL Ntg to use prn. She is to call if symptoms progress.   Continue ASA and statin.  2. Hypertension:  Fair control.  3. Hyperlipidemia:  Continue statin.  Labs checked by PCP. 4. Carotid Stenosis:  Stable for many years. I think follow up every 2 years appropriate.  5. Disposition:  F/u with Dr. Kassie Keng [place] in one year.    Signed, Brantlee Hinde [place]ique MD, [facility]    03/02/2015 5:57 PM

## 2015-03-02 NOTE — Patient Instructions (Signed)
Continue your current therapy  Use sublingual Ntg as needed for chest pain. Call me if your pain becomes more frequent or severe.  I will see you in one year.

## 2015-03-05 DIAGNOSIS — M5136 Other intervertebral disc degeneration, lumbar region: Secondary | ICD-10-CM | POA: Diagnosis not present

## 2015-03-05 DIAGNOSIS — G894 Chronic pain syndrome: Secondary | ICD-10-CM | POA: Diagnosis not present

## 2015-03-05 DIAGNOSIS — Z79891 Long term (current) use of opiate analgesic: Secondary | ICD-10-CM | POA: Diagnosis not present

## 2015-03-09 DIAGNOSIS — G518 Other disorders of facial nerve: Secondary | ICD-10-CM | POA: Diagnosis not present

## 2015-03-18 DIAGNOSIS — H01001 Unspecified blepharitis right upper eyelid: Secondary | ICD-10-CM | POA: Diagnosis not present

## 2015-03-18 DIAGNOSIS — H02204 Unspecified lagophthalmos left upper eyelid: Secondary | ICD-10-CM | POA: Diagnosis not present

## 2015-03-18 DIAGNOSIS — H01004 Unspecified blepharitis left upper eyelid: Secondary | ICD-10-CM | POA: Diagnosis not present

## 2015-03-18 DIAGNOSIS — H16212 Exposure keratoconjunctivitis, left eye: Secondary | ICD-10-CM | POA: Diagnosis not present

## 2015-03-18 DIAGNOSIS — H01002 Unspecified blepharitis right lower eyelid: Secondary | ICD-10-CM | POA: Diagnosis not present

## 2015-03-18 DIAGNOSIS — H04123 Dry eye syndrome of bilateral lacrimal glands: Secondary | ICD-10-CM | POA: Diagnosis not present

## 2015-03-18 DIAGNOSIS — H02205 Unspecified lagophthalmos left lower eyelid: Secondary | ICD-10-CM | POA: Diagnosis not present

## 2015-03-18 DIAGNOSIS — H01005 Unspecified blepharitis left lower eyelid: Secondary | ICD-10-CM | POA: Diagnosis not present

## 2015-04-01 DIAGNOSIS — H01001 Unspecified blepharitis right upper eyelid: Secondary | ICD-10-CM | POA: Diagnosis not present

## 2015-04-01 DIAGNOSIS — H01004 Unspecified blepharitis left upper eyelid: Secondary | ICD-10-CM | POA: Diagnosis not present

## 2015-04-01 DIAGNOSIS — H04123 Dry eye syndrome of bilateral lacrimal glands: Secondary | ICD-10-CM | POA: Diagnosis not present

## 2015-04-01 DIAGNOSIS — H02205 Unspecified lagophthalmos left lower eyelid: Secondary | ICD-10-CM | POA: Diagnosis not present

## 2015-04-01 DIAGNOSIS — H02204 Unspecified lagophthalmos left upper eyelid: Secondary | ICD-10-CM | POA: Diagnosis not present

## 2015-04-01 DIAGNOSIS — H01002 Unspecified blepharitis right lower eyelid: Secondary | ICD-10-CM | POA: Diagnosis not present

## 2015-04-01 DIAGNOSIS — H01005 Unspecified blepharitis left lower eyelid: Secondary | ICD-10-CM | POA: Diagnosis not present

## 2015-04-01 DIAGNOSIS — H16212 Exposure keratoconjunctivitis, left eye: Secondary | ICD-10-CM | POA: Diagnosis not present

## 2015-04-02 DIAGNOSIS — M5136 Other intervertebral disc degeneration, lumbar region: Secondary | ICD-10-CM | POA: Diagnosis not present

## 2015-04-02 DIAGNOSIS — G894 Chronic pain syndrome: Secondary | ICD-10-CM | POA: Diagnosis not present

## 2015-04-02 DIAGNOSIS — Z79891 Long term (current) use of opiate analgesic: Secondary | ICD-10-CM | POA: Diagnosis not present

## 2015-04-16 DIAGNOSIS — L638 Other alopecia areata: Secondary | ICD-10-CM | POA: Diagnosis not present

## 2015-05-06 ENCOUNTER — Ambulatory Visit: Payer: Medicare Other | Admitting: Gynecologic Oncology

## 2015-05-11 ENCOUNTER — Other Ambulatory Visit: Payer: Self-pay | Admitting: Cardiology

## 2015-05-11 DIAGNOSIS — I10 Essential (primary) hypertension: Secondary | ICD-10-CM | POA: Diagnosis not present

## 2015-05-11 DIAGNOSIS — F319 Bipolar disorder, unspecified: Secondary | ICD-10-CM | POA: Diagnosis not present

## 2015-05-11 DIAGNOSIS — M5136 Other intervertebral disc degeneration, lumbar region: Secondary | ICD-10-CM | POA: Diagnosis not present

## 2015-05-11 DIAGNOSIS — I6523 Occlusion and stenosis of bilateral carotid arteries: Secondary | ICD-10-CM

## 2015-05-11 DIAGNOSIS — E039 Hypothyroidism, unspecified: Secondary | ICD-10-CM | POA: Diagnosis not present

## 2015-05-11 DIAGNOSIS — E785 Hyperlipidemia, unspecified: Secondary | ICD-10-CM | POA: Diagnosis not present

## 2015-05-14 ENCOUNTER — Ambulatory Visit (HOSPITAL_COMMUNITY)
Admission: RE | Admit: 2015-05-14 | Discharge: 2015-05-14 | Disposition: A | Discharge status: Home / Self Care | Payer: Medicare Other | Source: Ambulatory Visit | Attending: Cardiology | Admitting: Cardiology

## 2015-05-14 DIAGNOSIS — I10 Essential (primary) hypertension: Secondary | ICD-10-CM | POA: Diagnosis not present

## 2015-05-14 DIAGNOSIS — I6523 Occlusion and stenosis of bilateral carotid arteries: Secondary | ICD-10-CM | POA: Diagnosis not present

## 2015-05-19 ENCOUNTER — Other Ambulatory Visit: Payer: Self-pay

## 2015-05-19 DIAGNOSIS — Z1231 Encounter for screening mammogram for malignant neoplasm of breast: Secondary | ICD-10-CM

## 2015-05-28 DIAGNOSIS — G894 Chronic pain syndrome: Secondary | ICD-10-CM | POA: Diagnosis not present

## 2015-05-28 DIAGNOSIS — M5136 Other intervertebral disc degeneration, lumbar region: Secondary | ICD-10-CM | POA: Diagnosis not present

## 2015-05-28 DIAGNOSIS — Z79891 Long term (current) use of opiate analgesic: Secondary | ICD-10-CM | POA: Diagnosis not present

## 2015-06-03 ENCOUNTER — Other Ambulatory Visit (HOSPITAL_COMMUNITY)
Admission: RE | Admit: 2015-06-03 | Discharge: 2015-06-03 | Disposition: A | Discharge status: Home / Self Care | Payer: Medicare Other | Source: Ambulatory Visit | Attending: Gynecologic Oncology | Admitting: Gynecologic Oncology

## 2015-06-03 ENCOUNTER — Encounter: Payer: Self-pay | Admitting: Gynecologic Oncology

## 2015-06-03 ENCOUNTER — Ambulatory Visit: Payer: Medicare Other | Attending: Gynecologic Oncology | Admitting: Gynecologic Oncology

## 2015-06-03 VITALS — BP 142/78 | HR 58 | Temp 97.9°F | Resp 18 | Ht 63.0 in | Wt 157.3 lb

## 2015-06-03 DIAGNOSIS — F319 Bipolar disorder, unspecified: Secondary | ICD-10-CM | POA: Insufficient documentation

## 2015-06-03 DIAGNOSIS — Z955 Presence of coronary angioplasty implant and graft: Secondary | ICD-10-CM | POA: Diagnosis not present

## 2015-06-03 DIAGNOSIS — Z96649 Presence of unspecified artificial hip joint: Secondary | ICD-10-CM | POA: Insufficient documentation

## 2015-06-03 DIAGNOSIS — Z905 Acquired absence of kidney: Secondary | ICD-10-CM | POA: Insufficient documentation

## 2015-06-03 DIAGNOSIS — M1990 Unspecified osteoarthritis, unspecified site: Secondary | ICD-10-CM | POA: Diagnosis not present

## 2015-06-03 DIAGNOSIS — Z87891 Personal history of nicotine dependence: Secondary | ICD-10-CM | POA: Diagnosis not present

## 2015-06-03 DIAGNOSIS — I2510 Atherosclerotic heart disease of native coronary artery without angina pectoris: Secondary | ICD-10-CM | POA: Insufficient documentation

## 2015-06-03 DIAGNOSIS — K5900 Constipation, unspecified: Secondary | ICD-10-CM | POA: Diagnosis not present

## 2015-06-03 DIAGNOSIS — Z9889 Other specified postprocedural states: Secondary | ICD-10-CM | POA: Diagnosis not present

## 2015-06-03 DIAGNOSIS — I252 Old myocardial infarction: Secondary | ICD-10-CM | POA: Insufficient documentation

## 2015-06-03 DIAGNOSIS — Z8544 Personal history of malignant neoplasm of other female genital organs: Secondary | ICD-10-CM

## 2015-06-03 DIAGNOSIS — Z7982 Long term (current) use of aspirin: Secondary | ICD-10-CM | POA: Insufficient documentation

## 2015-06-03 DIAGNOSIS — I10 Essential (primary) hypertension: Secondary | ICD-10-CM | POA: Diagnosis not present

## 2015-06-03 DIAGNOSIS — J439 Emphysema, unspecified: Secondary | ICD-10-CM | POA: Insufficient documentation

## 2015-06-03 DIAGNOSIS — Z85528 Personal history of other malignant neoplasm of kidney: Secondary | ICD-10-CM | POA: Insufficient documentation

## 2015-06-03 DIAGNOSIS — Z96659 Presence of unspecified artificial knee joint: Secondary | ICD-10-CM | POA: Insufficient documentation

## 2015-06-03 DIAGNOSIS — Z79899 Other long term (current) drug therapy: Secondary | ICD-10-CM | POA: Diagnosis not present

## 2015-06-03 DIAGNOSIS — R569 Unspecified convulsions: Secondary | ICD-10-CM | POA: Insufficient documentation

## 2015-06-03 DIAGNOSIS — C52 Malignant neoplasm of vagina: Secondary | ICD-10-CM | POA: Diagnosis not present

## 2015-06-03 DIAGNOSIS — F419 Anxiety disorder, unspecified: Secondary | ICD-10-CM | POA: Insufficient documentation

## 2015-06-03 DIAGNOSIS — G8929 Other chronic pain: Secondary | ICD-10-CM | POA: Diagnosis not present

## 2015-06-03 DIAGNOSIS — Z124 Encounter for screening for malignant neoplasm of cervix: Secondary | ICD-10-CM | POA: Insufficient documentation

## 2015-06-03 NOTE — Patient Instructions (Signed)
Happy 3 year anniversary!. We will notify you of the results of your Pap smear from today. Please return to see Dr. Lanell Persons in 6 months and return to see [place] in one year.

## 2015-06-03 NOTE — Progress Notes (Signed)
Consult Note: Gyn-Onc  [name] 70 y.o. female  CC:  Chief Complaint  Patient presents with  . Follow-up    Vaginal cancer    HPI: [name] is a very pleasant 41 year old with a 10 year history of abnormal Pap smears. In April 2011 she had a Pap smear revealed atypical squamous cells cannot exclude a high-grade dysplasia. She was worked up and evaluated and subsequently underwent a vaginal hysterectomy in June of 2011. Pathology at that time revealed the cervix with high-grade squamous dysplasia. Focal adenomyosis. In addition, the ectocervical margin of the cervix was positive. She was seen by Dr. Deatra Ina on October 29 at which time a Pap smear revealed VAIN2/VAIN 3.   She underwent laser ablation on for dysplasia on February 07, 2010. Operative findings included a raised hyperkeratotic lesion in the vagina encompassing the entire vaginal cuff. The area was acetowhite changing. The total area measured approximately 4 x 4 centimeters.  I saw her in 2013 and her Pap smear was normal. She subsequently saw Dr. Deatra Ina October 30, 13 and her Pap smear returned as atypical squamous cells of undetermined significance. The patient has positive high-risk HPV.  I saw her in late 2013 acetowhite epithelial changes at the top of the vagina. However, there was a punctate area with telangiectasias in the upper right vaginal fornix. A biopsy of this was performed. It revealed a squamous cell carcinoma.   She completed 5 fractions of HDR with Dr. Lanell Persons in 2/14. She was ultimately diagnosed with a renal cell carcinoma and is status post a partial left nephrectomy 07/09/2012. The pathology revealed a T1-1 grade 2 clear cell renal cell carcinoma. The margins were negative and she was dispositioned to close followup. She comes in today for followup Pap smear. Pap smear performed in May of 2014 revealed atypical squamous cells of undetermined significance. However, she just finished her brachytherapy on  February 4. I last saw her 8/16 at which time her exam and Pap smear were negative. She was seen by Dr. Denman George secondary to development of a right buttock caruncle in December 2016. She had an I&D of that performed and did well. She comes in today for her routine surveillance.  Interval History: She comes in today for followup. She's overall doing quite well. She does have a Bite on her left hand and several scratches on her left leg as she has a new kitten. She does occasionally have some abdominal pain related to her constipation. She states she did should take her marrow laxative every other day but occasionally she forgets and that's when she experiences constipation and pain. As soon as she takes that she is able to have a bowel movement and feels better. Her weight has been fairly stable. She's eating well and enjoying a good quality of life. She continues to use her vaginal dilators regularly. She's not had any recurrent abscesses since she was seen by Dr. Denman George in December.  Review of Systems  Constitutional: No complaints, weight is stable Skin: No rash Cardiovascular: No chest pain, Occ sob, no edema  Pulmonary: No cough  Gastro Intestinal: No nausea, vomiting, + constipation, no diarrhea reported. No bright red blood per rectum or change in bowel movement.  Genitourinary: No frequency, urgency, or dysuria.  Denies vaginal bleeding and discharge.  Using dilators 3x/week. Musculoskeletal: + Back pain which radiates to her legs bilaterally  Neurologic: No changes Psychology: No issues   Current Meds:  Outpatient Encounter Prescriptions as of 06/03/2015  Medication Sig  . albuterol (PROVENTIL HFA;VENTOLIN HFA) 108 (90 BASE) MCG/ACT inhaler Inhale 2 puffs into the lungs every 6 (six) hours as needed for wheezing.  Marland Kitchen amLODipine (NORVASC) 2.5 MG tablet Take 1 tablet (2.5 mg total) by mouth daily.  Marland Kitchen aspirin EC 81 MG tablet Take 81 mg by mouth every morning.   . Calcium Carbonate-Vitamin D  600-400 MG-UNIT per tablet Take 1 tablet by mouth.  . Cholecalciferol (VITAMIN D3) 2000 UNITS TABS Take 1 capsule by mouth daily.   . clonazePAM (KLONOPIN) 1 MG tablet Take 1 mg by mouth 3 (three) times daily as needed for anxiety.   . cloNIDine (CATAPRES) 0.2 MG tablet Take 0.2 mg by mouth 2 (two) times daily.   Marland Kitchen conjugated estrogens (PREMARIN) vaginal cream Place 1 Applicatorful vaginally daily.  . cycloSPORINE (RESTASIS) 0.05 % ophthalmic emulsion Place 1 drop into both eyes 2 (two) times daily.  Marland Kitchen docusate sodium (COLACE) 100 MG capsule Take 1 capsule (100 mg total) by mouth 2 (two) times daily.  Marland Kitchen esomeprazole (NEXIUM) 20 MG capsule Take 20 mg by mouth daily before breakfast.  . furosemide (LASIX) 20 MG tablet Take 20 mg by mouth every morning.  . lamoTRIgine (LAMICTAL) 100 MG tablet Take 200 mg by mouth 2 (two) times daily.   . Lubricants (SURGILUBE EX) Place 1 application vaginally daily before supper.   . mometasone-formoterol (DULERA) 100-5 MCG/ACT AERO Inhale 2 puffs into the lungs 2 (two) times daily.  . Multiple Vitamin (MULTIVITAMIN) capsule Take 1 capsule by mouth daily.   . nitroGLYCERIN (NITROSTAT) 0.4 MG SL tablet Place 1 tablet (0.4 mg total) under the tongue every 5 (five) minutes as needed for chest pain.  Marland Kitchen oxyCODONE-acetaminophen (PERCOCET) 10-325 MG per tablet Take 1 tablet by mouth every 4 (four) hours as needed for pain.   . polyethylene glycol (MIRALAX / GLYCOLAX) packet Take 17 g by mouth daily.  . potassium chloride (KLOR-CON) 8 MEQ tablet Take 8 mEq by mouth daily.  . risperiDONE (RISPERDAL) 3 MG tablet Take 3 mg by mouth at bedtime.  . rosuvastatin (CRESTOR) 5 MG tablet Take 5 mg by mouth at bedtime.  . sertraline (ZOLOFT) 50 MG tablet Take 50 mg by mouth every morning.   . tamsulosin (FLOMAX) 0.4 MG CAPS capsule Take 0.4 mg by mouth daily.  Marland Kitchen tiotropium (SPIRIVA) 18 MCG inhalation capsule Place 18 mcg into inhaler and inhale daily. EVENING   No  facility-administered encounter medications on file as of 06/03/2015.    Allergy: No Known Allergies  Social Hx:   Social History   Social History  . Marital Status: Divorced    Spouse Name: N/A  . Number of Children: N/A  . Years of Education: N/A   Occupational History  . Not on file.   Social History Main Topics  . Smoking status: Former Smoker -- 2.00 packs/day for 50 years    Types: Cigarettes    Quit date: 01/18/2011  . Smokeless tobacco: Never Used     Comment: STATES QUIT SMOKING 01-18-2011  . Alcohol Use: No     Comment: RECOVERING ALCOHOLIC--   QUIT IN [id number]  . Drug Use: No  . Sexual Activity: No   Other Topics Concern  . Not on file   Social History Narrative    Past Surgical Hx:  Past Surgical History  Procedure Laterality Date  . Total knee arthroplasty  09-22-2009    RIGHT  . Vaginal hysterectomy  07-06-2009  . Hemiarthroplasty hip  12-26-2008  LEFT FEMORAL NECK FX  . Total hip arthroplasty  04-15-2008    POST FAILED  RIGHT HIP ORIF FEMORAL FX  . Cervical conization w/bx  09-23-2008  . Orif hip fracture  02-13-2007    RIGHT FEMORAL NECK FX  . Right shoulder surg.  2007  . Coronary angioplasty with stent placement  2000-   INFERIOR MI    X1 STENT TO RCA  . Carotid endarterectomy  1995    RIGHT  . Cataract extraction w/ intraocular lens  implant, bilateral    . Orif right distal radius and right proximal humerous neck fx's  10-10-2005  . Upper right vaginal region  12/28/11    BIOPSY: SQUAMOUS CELL CARCINOMA  . Eus N/A 03/05/2012    Procedure: FULL UPPER ENDOSCOPIC ULTRASOUND (EUS) RADIAL and EGD;  Surgeon: Milus Banister, MD;  Location: [facility];  Service: Endoscopy;  Laterality: N/A;  ercp scope first than eus scope  . Robotic assited partial nephrectomy Left 07/09/2012    Procedure: ROBOTIC ASSITED PARTIAL NEPHRECTOMY;  Surgeon: Dutch Gray, MD;  Location: [facility];  Service: Urology;  Laterality: Left;    Past Medical Hx:  Past Medical  History  Diagnosis Date  . Hypertension   . Depression   . Alcoholism ([place])     recovering since 2000  . Inferior MI ([place]) 2000--  POST PTCA W/ STENT X1  . Bipolar disorder ([place])   . Peripheral vascular disease ([place]) POST RIGHT CAROTID SURG.  1995  . Status post carotid endarterectomy RIGHT --  1995  . Status post primary angioplasty with coronary stent 2000--  POST INFERIOR MI  . Blood transfusion   . Normal cardiac stress test 2008-- PER DR Martinique NOTE 09-07-2009  . Arthritis BACK  . Chronic back pain   . Osteoporosis   . Rosacea LEFT FACIAL RASH  . Idiopathic acute facial nerve palsy LEFT SIDE--  BOTOX THERAPY  . Blepharospasm LEFT EYE  . History of alcohol abuse RECOVERING SINCE 2000  . Anxiety   . Unstable balance WALKS W/ CANE  . HTN (hypertension)   . Vaginal cancer ([place])   . Cervical cancer ([place]) 12/28/11     vagina upper right bx==squamous cell ca  . Emphysema   . S/P radiation therapy 02/21/2012    38.75 Gy HDR 5 Fractions- vaginal cuff  . Left renal mass 03/02/12  . Blepharospasm     left  . Seizures ([place])     x 1 after abrupt discontinuation of  Clonidine  . PONV (postoperative nausea and vomiting)   . Other and unspecified general anesthetics causing adverse effect in therapeutic use post op delirium--  last anes record w/ chart  from   09-22-2009 (spinal w/ light sedation)  . Coronary artery disease CARDIOLOGIST- DR Martinique--- LAST VISIT NOTE 09-07-2009  W/ CHART  . Renal cell carcinoma ([place]) 07/09/12    Left mass    Family Hx:  Family History  Problem Relation Age of Onset  . Hypertension Mother   . Hypertension Father     Vitals:  Blood pressure 142/78, pulse 58, temperature 97.9 F (36.6 C), temperature source Oral, resp. rate 18, height 5\' 3"  (1.6 m), weight 157 lb 4.8 oz (71.351 kg), SpO2 94 %.  Physical Exam:  Well-nourished well-developed female in no acute distress.   Neck: Supple, no lymphadenopathy, no thyromegaly.  Lungs: Clear to  auscultation bilaterally.  Cardiac: Regular rate and rhythm  Abdomen: Soft, nontender, nondistended. There is no palpable mass or prostatomegaly.  Well-healed robotic skin incisions.  Groins: No lymphadenopathy.  Extremities: No edema. Several large scratches on left shin and lateral leg bandage over left hand.  Pelvic: Normal external female genitalia. Vagina is markedly atrophic. Vaginal cuff is visualized is no visible lesions except for radiation telangectasias. Improved vaginal length with dilators. ThinPrep Pap was submitted without difficulty. Bimanual examination reveals no masses or nodularity. Rectal confirms.  Assessment/Plan: 70 year old with a clinical stage I vaginal carcinoma treated with high-dose rate vaginal brachyherapy. She completed her treatment on February 21, 2012.  We'll followup in results for Pap smear from today. She will see Dr. Lanell Persons  in 6 months and return to see me in 12 months.  Orrie Schubert A., MD 06/03/2015, 9:38 AM

## 2015-06-05 ENCOUNTER — Telehealth: Payer: Self-pay

## 2015-06-05 LAB — CYTOLOGY - PAP

## 2015-06-05 NOTE — Telephone Encounter (Signed)
Orders received from [facility] to contact the patient to update with PAP results being "normal" collected on 06/03/2015 during visit with Dr Nancy Marus . Patient states understanding , denies further questions at this time.

## 2015-06-08 DIAGNOSIS — G518 Other disorders of facial nerve: Secondary | ICD-10-CM | POA: Diagnosis not present

## 2015-06-18 DIAGNOSIS — H04123 Dry eye syndrome of bilateral lacrimal glands: Secondary | ICD-10-CM | POA: Diagnosis not present

## 2015-06-25 ENCOUNTER — Ambulatory Visit
Admission: RE | Admit: 2015-06-25 | Discharge: 2015-06-25 | Disposition: A | Discharge status: Home / Self Care | Payer: Medicare Other | Source: Ambulatory Visit

## 2015-06-25 DIAGNOSIS — Z1231 Encounter for screening mammogram for malignant neoplasm of breast: Secondary | ICD-10-CM

## 2015-06-30 DIAGNOSIS — M25571 Pain in right ankle and joints of right foot: Secondary | ICD-10-CM | POA: Diagnosis not present

## 2015-07-16 DIAGNOSIS — L638 Other alopecia areata: Secondary | ICD-10-CM | POA: Diagnosis not present

## 2015-08-06 DIAGNOSIS — E785 Hyperlipidemia, unspecified: Secondary | ICD-10-CM | POA: Diagnosis not present

## 2015-08-06 DIAGNOSIS — I10 Essential (primary) hypertension: Secondary | ICD-10-CM | POA: Diagnosis not present

## 2015-08-06 DIAGNOSIS — F319 Bipolar disorder, unspecified: Secondary | ICD-10-CM | POA: Diagnosis not present

## 2015-08-06 DIAGNOSIS — J449 Chronic obstructive pulmonary disease, unspecified: Secondary | ICD-10-CM | POA: Diagnosis not present

## 2015-08-06 DIAGNOSIS — R946 Abnormal results of thyroid function studies: Secondary | ICD-10-CM | POA: Diagnosis not present

## 2015-08-14 DIAGNOSIS — E871 Hypo-osmolality and hyponatremia: Secondary | ICD-10-CM | POA: Diagnosis not present

## 2015-08-21 ENCOUNTER — Other Ambulatory Visit (HOSPITAL_COMMUNITY): Payer: Self-pay | Admitting: Urology

## 2015-08-21 ENCOUNTER — Ambulatory Visit (HOSPITAL_COMMUNITY)
Admission: RE | Admit: 2015-08-21 | Discharge: 2015-08-21 | Disposition: A | Discharge status: Home / Self Care | Payer: Medicare Other | Source: Ambulatory Visit | Attending: Urology | Admitting: Urology

## 2015-08-21 DIAGNOSIS — Z8544 Personal history of malignant neoplasm of other female genital organs: Secondary | ICD-10-CM

## 2015-08-21 DIAGNOSIS — R938 Abnormal findings on diagnostic imaging of other specified body structures: Secondary | ICD-10-CM | POA: Diagnosis not present

## 2015-08-21 DIAGNOSIS — I700 Atherosclerosis of aorta: Secondary | ICD-10-CM | POA: Diagnosis not present

## 2015-08-21 DIAGNOSIS — R918 Other nonspecific abnormal finding of lung field: Secondary | ICD-10-CM | POA: Diagnosis not present

## 2015-08-21 DIAGNOSIS — C642 Malignant neoplasm of left kidney, except renal pelvis: Secondary | ICD-10-CM | POA: Diagnosis not present

## 2015-08-25 DIAGNOSIS — C642 Malignant neoplasm of left kidney, except renal pelvis: Secondary | ICD-10-CM | POA: Diagnosis not present

## 2015-09-01 DIAGNOSIS — N3942 Incontinence without sensory awareness: Secondary | ICD-10-CM | POA: Diagnosis not present

## 2015-09-01 DIAGNOSIS — Z85528 Personal history of other malignant neoplasm of kidney: Secondary | ICD-10-CM | POA: Diagnosis not present

## 2015-09-03 DIAGNOSIS — M5136 Other intervertebral disc degeneration, lumbar region: Secondary | ICD-10-CM | POA: Diagnosis not present

## 2015-09-03 DIAGNOSIS — G894 Chronic pain syndrome: Secondary | ICD-10-CM | POA: Diagnosis not present

## 2015-09-03 DIAGNOSIS — Z79891 Long term (current) use of opiate analgesic: Secondary | ICD-10-CM | POA: Diagnosis not present

## 2015-09-14 DIAGNOSIS — G518 Other disorders of facial nerve: Secondary | ICD-10-CM | POA: Diagnosis not present

## 2015-09-23 DIAGNOSIS — L03115 Cellulitis of right lower limb: Secondary | ICD-10-CM | POA: Diagnosis not present

## 2015-10-07 DIAGNOSIS — L03115 Cellulitis of right lower limb: Secondary | ICD-10-CM | POA: Diagnosis not present

## 2015-10-10 ENCOUNTER — Encounter (HOSPITAL_COMMUNITY): Payer: Self-pay | Admitting: Emergency Medicine

## 2015-10-10 ENCOUNTER — Emergency Department (HOSPITAL_COMMUNITY): Payer: Medicare Other

## 2015-10-10 ENCOUNTER — Emergency Department (HOSPITAL_COMMUNITY)
Admission: EM | Admit: 2015-10-10 | Discharge: 2015-10-10 | Disposition: A | Discharge status: Home / Self Care | Payer: Medicare Other | Attending: Emergency Medicine | Admitting: Emergency Medicine

## 2015-10-10 DIAGNOSIS — Y9301 Activity, walking, marching and hiking: Secondary | ICD-10-CM | POA: Diagnosis not present

## 2015-10-10 DIAGNOSIS — Z8541 Personal history of malignant neoplasm of cervix uteri: Secondary | ICD-10-CM | POA: Diagnosis not present

## 2015-10-10 DIAGNOSIS — S4992XA Unspecified injury of left shoulder and upper arm, initial encounter: Secondary | ICD-10-CM | POA: Diagnosis not present

## 2015-10-10 DIAGNOSIS — I2510 Atherosclerotic heart disease of native coronary artery without angina pectoris: Secondary | ICD-10-CM | POA: Diagnosis not present

## 2015-10-10 DIAGNOSIS — Z7982 Long term (current) use of aspirin: Secondary | ICD-10-CM | POA: Insufficient documentation

## 2015-10-10 DIAGNOSIS — I10 Essential (primary) hypertension: Secondary | ICD-10-CM | POA: Insufficient documentation

## 2015-10-10 DIAGNOSIS — J449 Chronic obstructive pulmonary disease, unspecified: Secondary | ICD-10-CM | POA: Diagnosis not present

## 2015-10-10 DIAGNOSIS — M25519 Pain in unspecified shoulder: Secondary | ICD-10-CM | POA: Diagnosis not present

## 2015-10-10 DIAGNOSIS — S299XXA Unspecified injury of thorax, initial encounter: Secondary | ICD-10-CM | POA: Diagnosis not present

## 2015-10-10 DIAGNOSIS — M25512 Pain in left shoulder: Secondary | ICD-10-CM | POA: Diagnosis not present

## 2015-10-10 DIAGNOSIS — Z79899 Other long term (current) drug therapy: Secondary | ICD-10-CM | POA: Diagnosis not present

## 2015-10-10 DIAGNOSIS — W010XXA Fall on same level from slipping, tripping and stumbling without subsequent striking against object, initial encounter: Secondary | ICD-10-CM | POA: Insufficient documentation

## 2015-10-10 DIAGNOSIS — W19XXXA Unspecified fall, initial encounter: Secondary | ICD-10-CM

## 2015-10-10 DIAGNOSIS — Z9861 Coronary angioplasty status: Secondary | ICD-10-CM | POA: Diagnosis not present

## 2015-10-10 DIAGNOSIS — Y999 Unspecified external cause status: Secondary | ICD-10-CM | POA: Insufficient documentation

## 2015-10-10 DIAGNOSIS — R072 Precordial pain: Secondary | ICD-10-CM | POA: Diagnosis not present

## 2015-10-10 DIAGNOSIS — Z85528 Personal history of other malignant neoplasm of kidney: Secondary | ICD-10-CM | POA: Insufficient documentation

## 2015-10-10 DIAGNOSIS — Y92009 Unspecified place in unspecified non-institutional (private) residence as the place of occurrence of the external cause: Secondary | ICD-10-CM | POA: Insufficient documentation

## 2015-10-10 DIAGNOSIS — S20212A Contusion of left front wall of thorax, initial encounter: Secondary | ICD-10-CM | POA: Diagnosis not present

## 2015-10-10 DIAGNOSIS — T148 Other injury of unspecified body region: Secondary | ICD-10-CM | POA: Diagnosis not present

## 2015-10-10 LAB — URINALYSIS, ROUTINE W REFLEX MICROSCOPIC
BILIRUBIN URINE: NEGATIVE
Glucose, UA: NEGATIVE mg/dL
Hgb urine dipstick: NEGATIVE
KETONES UR: NEGATIVE mg/dL
Leukocytes, UA: NEGATIVE
Nitrite: NEGATIVE
Protein, ur: NEGATIVE mg/dL
Specific Gravity, Urine: 1.012 (ref 1.005–1.030)
pH: 6.5 (ref 5.0–8.0)

## 2015-10-10 NOTE — ED Triage Notes (Signed)
Pt fell at 6am this morning. Pt c/o pain to L clavicle, swelling. Pt denies dizziness / LOC.

## 2015-10-10 NOTE — ED Provider Notes (Signed)
[facility] DEPT Provider Note   CSN: [id number] Arrival date & time: 10/10/15  [id number]     History   Chief Complaint Chief Complaint  Patient presents with  . Fall  . Clavicle Injury    HPI [name] is a 70 y.o. female.  She presents for evaluation of injury to clavicle. She recalls walking, to get some cat food, when she slipped and fell, injuring her left clavicle. She did not get up. A friend of hers, with whom she lives, called an ambulance. There is no reported loss of consciousness. She denies preceding symptoms or illnesses. At this time, she is hungry and wants to urinate. She denies injuring her head, neck or back. There are no other known modifying factors.    HPI  Past Medical History:  Diagnosis Date  . Alcoholism ([place])    recovering since 2000  . Anxiety   . Arthritis BACK  . Bipolar disorder ([place])   . Blepharospasm LEFT EYE  . Blepharospasm    left  . Blood transfusion   . Cervical cancer ([place]) 12/28/11    vagina upper right bx==squamous cell ca  . Chronic back pain   . Coronary artery disease CARDIOLOGIST- DR Martinique--- LAST VISIT NOTE 09-07-2009  W/ CHART  . Depression   . Emphysema   . History of alcohol abuse RECOVERING SINCE 2000  . HTN (hypertension)   . Hypertension   . Idiopathic acute facial nerve palsy LEFT SIDE--  BOTOX THERAPY  . Inferior MI ([place]) 2000--  POST PTCA W/ STENT X1  . Left renal mass 03/02/12  . Normal cardiac stress test 2008-- PER DR Martinique NOTE 09-07-2009  . Osteoporosis   . Other and unspecified general anesthetics causing adverse effect in therapeutic use post op delirium--  last anes record w/ chart  from   09-22-2009 (spinal w/ light sedation)  . Peripheral vascular disease ([place]) POST RIGHT CAROTID SURG.  1995  . PONV (postoperative nausea and vomiting)   . Renal cell carcinoma ([place]) 07/09/12   Left mass  . Rosacea LEFT FACIAL RASH  . S/P radiation therapy 02/21/2012   38.75 Gy HDR 5 Fractions- vaginal cuff  .  Seizures ([place])    x 1 after abrupt discontinuation of  Clonidine  . Status post carotid endarterectomy RIGHT --  1995  . Status post primary angioplasty with coronary stent 2000--  POST INFERIOR MI  . Unstable balance WALKS W/ CANE  . Vaginal cancer [facility])     Patient Active Problem List   Diagnosis Date Noted  . Preoperative clearance 06/13/2012  . Unspecified gastritis and gastroduodenitis without mention of hemorrhage 03/05/2012  . Hypoxia 03/04/2012  . Former smoker 03/04/2012  . COPD (chronic obstructive pulmonary disease) ([place]) 03/04/2012  . Abdominal pain, acute, epigastric 03/02/2012  . Abnormal abdominal CT scan 03/02/2012  . Hypertension   . Depression   . Alcoholism ([place])   . Bipolar disorder ([place])   . Status post carotid endarterectomy   . Arthritis   . Idiopathic acute facial nerve palsy   . Blepharospasm   . Anxiety   . Vaginal cancer ([place])   . Cancer of vaginal vault - recurrent from cervix 01/25/2012  . Cervical cancer ([place]) 12/28/2011  . Carotid artery disease ([place]) 06/28/2011  . Coronary artery disease   . Inferior MI ([place])   . Peripheral vascular disease ([place])   . HTN (hypertension)   . Dysplasia of vagina, histologically confirmed 12/14/2010    Past Surgical History:  Procedure Laterality Date  . CAROTID ENDARTERECTOMY  1995   RIGHT  . CATARACT EXTRACTION W/ INTRAOCULAR LENS  IMPLANT, BILATERAL    . CERVICAL CONIZATION W/BX  09-23-2008  . CORONARY ANGIOPLASTY WITH STENT PLACEMENT  2000-   INFERIOR MI   X1 STENT TO RCA  . EUS N/A 03/05/2012   Procedure: FULL UPPER ENDOSCOPIC ULTRASOUND (EUS) RADIAL and EGD;  Surgeon: Milus Banister, MD;  [facility];  Service: Endoscopy;  Laterality: N/A;  ercp scope first than eus scope  . HEMIARTHROPLASTY HIP  12-26-2008   LEFT FEMORAL NECK FX  . ORIF HIP FRACTURE  02-13-2007   RIGHT FEMORAL NECK FX  . ORIF RIGHT DISTAL RADIUS AND RIGHT PROXIMAL HUMEROUS NECK FX'S  10-10-2005  . RIGHT SHOULDER SURG.   2007  . ROBOTIC ASSITED PARTIAL NEPHRECTOMY Left 07/09/2012   Procedure: ROBOTIC ASSITED PARTIAL NEPHRECTOMY;  Surgeon: Dutch Gray, MD;  Location: [facility];  Service: Urology;  Laterality: Left;  . TOTAL HIP ARTHROPLASTY  04-15-2008   POST FAILED  RIGHT HIP ORIF FEMORAL FX  . TOTAL KNEE ARTHROPLASTY  09-22-2009   RIGHT  . UPPER RIGHT VAGINAL REGION  12/28/11   BIOPSY: SQUAMOUS CELL CARCINOMA  . VAGINAL HYSTERECTOMY  07-06-2009    OB History    No data available       Home Medications    Prior to Admission medications   Medication Sig Start Date End Date Taking? Authorizing Provider  albuterol (PROVENTIL HFA;VENTOLIN HFA) 108 (90 BASE) MCG/ACT inhaler Inhale 2 puffs into the lungs every 6 (six) hours as needed for wheezing.    Historical Provider, MD  amLODipine (NORVASC) 2.5 MG tablet Take 1 tablet (2.5 mg total) by mouth daily. 12/27/12   Liliane Shi, PA-C  aspirin EC 81 MG tablet Take 81 mg by mouth every morning.     Historical Provider, MD  Calcium Carbonate-Vitamin D 600-400 MG-UNIT per tablet Take 1 tablet by mouth.    Historical Provider, MD  Cholecalciferol (VITAMIN D3) 2000 UNITS TABS Take 1 capsule by mouth daily.     Historical Provider, MD  clonazePAM (KLONOPIN) 1 MG tablet Take 1 mg by mouth 3 (three) times daily as needed for anxiety.     Historical Provider, MD  cloNIDine (CATAPRES) 0.2 MG tablet Take 0.2 mg by mouth 2 (two) times daily.     Historical Provider, MD  conjugated estrogens (PREMARIN) vaginal cream Place 1 Applicatorful vaginally daily.    Historical Provider, MD  cycloSPORINE (RESTASIS) 0.05 % ophthalmic emulsion Place 1 drop into both eyes 2 (two) times daily.    Historical Provider, MD  docusate sodium (COLACE) 100 MG capsule Take 1 capsule (100 mg total) by mouth 2 (two) times daily. 07/12/12   Raynelle Bring, MD  esomeprazole (NEXIUM) 20 MG capsule Take 20 mg by mouth daily before breakfast.    Historical Provider, MD  furosemide (LASIX) 20 MG tablet  Take 20 mg by mouth every morning.    Historical Provider, MD  lamoTRIgine (LAMICTAL) 100 MG tablet Take 200 mg by mouth 2 (two) times daily.     Historical Provider, MD  Lubricants (SURGILUBE EX) Place 1 application vaginally daily before supper.     Historical Provider, MD  mometasone-formoterol (DULERA) 100-5 MCG/ACT AERO Inhale 2 puffs into the lungs 2 (two) times daily. 03/06/12   Bonnielee Haff, MD  Multiple Vitamin (MULTIVITAMIN) capsule Take 1 capsule by mouth daily.     Historical Provider, MD  nitroGLYCERIN (NITROSTAT) 0.4 MG SL  tablet Place 1 tablet (0.4 mg total) under the tongue every 5 (five) minutes as needed for chest pain. 03/02/15   Peter M Martinique, MD  oxyCODONE-acetaminophen (PERCOCET) 10-325 MG per tablet Take 1 tablet by mouth every 4 (four) hours as needed for pain.     Historical Provider, MD  polyethylene glycol (MIRALAX / GLYCOLAX) packet Take 17 g by mouth daily.    Historical Provider, MD  potassium chloride (KLOR-CON) 8 MEQ tablet Take 8 mEq by mouth daily.    Historical Provider, MD  risperiDONE (RISPERDAL) 3 MG tablet Take 3 mg by mouth at bedtime.    Historical Provider, MD  rosuvastatin (CRESTOR) 5 MG tablet Take 5 mg by mouth at bedtime.    Historical Provider, MD  sertraline (ZOLOFT) 50 MG tablet Take 50 mg by mouth every morning.     Historical Provider, MD  tamsulosin (FLOMAX) 0.4 MG CAPS capsule Take 0.4 mg by mouth daily.    Historical Provider, MD  tiotropium (SPIRIVA) 18 MCG inhalation capsule Place 18 mcg into inhaler and inhale daily. EVENING 03/06/12   Bonnielee Haff, MD    Family History Family History  Problem Relation Age of Onset  . Hypertension Mother   . Hypertension Father     Social History Social History  Substance Use Topics  . Smoking status: Former Smoker    Packs/day: 2.00    Years: 50.00    Types: Cigarettes    Quit date: 01/18/2011  . Smokeless tobacco: Never Used     Comment: STATES QUIT SMOKING 01-18-2011  . Alcohol use No      Comment: RECOVERING ALCOHOLIC--   QUIT IN [id number]     Allergies   Review of patient's allergies indicates no known allergies.   Review of Systems Review of Systems  All other systems reviewed and are negative.    Physical Exam Updated Vital Signs BP 133/81 (BP Location: Right Arm)   Pulse (!) 57   Temp 97.8 F (36.6 C) (Oral)   Resp 20   SpO2 96%   Physical Exam  Constitutional: She is oriented to person, place, and time. She appears well-developed and well-nourished.  HENT:  Head: Normocephalic and atraumatic.  Eyes: Conjunctivae and EOM are normal. Pupils are equal, round, and reactive to light.  Neck: Normal range of motion and phonation normal. Neck supple.  Cardiovascular: Normal rate and regular rhythm.   Pulmonary/Chest: Effort normal and breath sounds normal. She exhibits no tenderness.  Abdominal: Soft. She exhibits no distension. There is no tenderness. There is no guarding.  Musculoskeletal: Normal range of motion.  Neurological: She is alert and oriented to person, place, and time. She exhibits normal muscle tone.  Skin: Skin is warm and dry.  Psychiatric: She has a normal mood and affect. Her behavior is normal. Judgment and thought content normal.  Nursing note and vitals reviewed.    ED Treatments / Results  Labs (all labs ordered are listed, but only abnormal results are displayed) Labs Reviewed  URINALYSIS, ROUTINE W REFLEX MICROSCOPIC (NOT AT [facility])    EKG  EKG Interpretation None       Radiology No results found.  Procedures Procedures (including critical care time)  Medications Ordered in ED Medications - No data to display   Initial Impression / Assessment and Plan / ED Course  I have reviewed the triage vital signs and the nursing notes.  Pertinent labs & imaging results that were available during my care of the patient were reviewed by me  and considered in my medical decision making (see chart for details).  Clinical Course     Medications - No data to display  Patient Vitals for the past 24 hrs:  BP Temp Temp src Pulse Resp SpO2  10/10/15 0744 133/81 97.8 F (36.6 C) Oral (!) 57 20 96 %    10:18 AM Reevaluation with update and discussion. After initial assessment and treatment, an updated evaluation reveals She remains comfortable and has no further complaints. Findings discussed with patient and her friend, all questions answered. Kynedi Profitt L    Final Clinical Impressions(s) / ED Diagnoses   Final diagnoses:  Fall at home, initial encounter  Contusion, chest wall, left, initial encounter    Fall, likely mechanical, without serious injury. No prodrome. Urinalysis as screening is negative.  Nursing Notes Reviewed/ Care Coordinated Applicable Imaging Reviewed Interpretation of Laboratory Data incorporated into ED treatment  The patient appears reasonably screened and/or stabilized for discharge and I doubt any other medical condition or other [facility] requiring further screening, evaluation, or treatment in the ED at this time prior to discharge.  Plan: Home Medications- continue; Home Treatments- rest, ice; return here if the recommended treatment, does not improve the symptoms; Recommended follow up- PCP prn    New Prescriptions New Prescriptions   No medications on file     Daleen Bo, MD 10/10/15 1020

## 2015-10-19 ENCOUNTER — Ambulatory Visit
Admission: RE | Admit: 2015-10-19 | Discharge: 2015-10-19 | Disposition: A | Discharge status: Home / Self Care | Payer: Medicare Other | Source: Ambulatory Visit | Attending: Family Medicine | Admitting: Family Medicine

## 2015-10-19 ENCOUNTER — Other Ambulatory Visit: Payer: Self-pay | Admitting: Family Medicine

## 2015-10-19 DIAGNOSIS — M25512 Pain in left shoulder: Secondary | ICD-10-CM | POA: Diagnosis not present

## 2015-10-19 DIAGNOSIS — R079 Chest pain, unspecified: Secondary | ICD-10-CM | POA: Diagnosis not present

## 2015-10-19 DIAGNOSIS — S20219A Contusion of unspecified front wall of thorax, initial encounter: Secondary | ICD-10-CM | POA: Diagnosis not present

## 2015-10-19 DIAGNOSIS — R072 Precordial pain: Secondary | ICD-10-CM | POA: Diagnosis not present

## 2015-10-19 DIAGNOSIS — S4992XA Unspecified injury of left shoulder and upper arm, initial encounter: Secondary | ICD-10-CM | POA: Diagnosis not present

## 2015-10-19 DIAGNOSIS — W19XXXD Unspecified fall, subsequent encounter: Secondary | ICD-10-CM | POA: Diagnosis not present

## 2015-10-19 DIAGNOSIS — Z23 Encounter for immunization: Secondary | ICD-10-CM | POA: Diagnosis not present

## 2015-10-19 DIAGNOSIS — E785 Hyperlipidemia, unspecified: Secondary | ICD-10-CM | POA: Diagnosis not present

## 2015-10-19 DIAGNOSIS — F319 Bipolar disorder, unspecified: Secondary | ICD-10-CM | POA: Diagnosis not present

## 2015-10-19 DIAGNOSIS — I10 Essential (primary) hypertension: Secondary | ICD-10-CM | POA: Diagnosis not present

## 2015-10-19 DIAGNOSIS — S299XXA Unspecified injury of thorax, initial encounter: Secondary | ICD-10-CM | POA: Diagnosis not present

## 2015-10-19 DIAGNOSIS — Y92099 Unspecified place in other non-institutional residence as the place of occurrence of the external cause: Secondary | ICD-10-CM | POA: Diagnosis not present

## 2015-10-20 DIAGNOSIS — F332 Major depressive disorder, recurrent severe without psychotic features: Secondary | ICD-10-CM | POA: Diagnosis not present

## 2015-10-25 DIAGNOSIS — Z8542 Personal history of malignant neoplasm of other parts of uterus: Secondary | ICD-10-CM | POA: Diagnosis not present

## 2015-10-25 DIAGNOSIS — J449 Chronic obstructive pulmonary disease, unspecified: Secondary | ICD-10-CM | POA: Diagnosis not present

## 2015-10-25 DIAGNOSIS — Z85528 Personal history of other malignant neoplasm of kidney: Secondary | ICD-10-CM | POA: Diagnosis not present

## 2015-10-25 DIAGNOSIS — Z9181 History of falling: Secondary | ICD-10-CM | POA: Diagnosis not present

## 2015-10-25 DIAGNOSIS — F319 Bipolar disorder, unspecified: Secondary | ICD-10-CM | POA: Diagnosis not present

## 2015-10-25 DIAGNOSIS — I252 Old myocardial infarction: Secondary | ICD-10-CM | POA: Diagnosis not present

## 2015-10-25 DIAGNOSIS — M810 Age-related osteoporosis without current pathological fracture: Secondary | ICD-10-CM | POA: Diagnosis not present

## 2015-10-25 DIAGNOSIS — R296 Repeated falls: Secondary | ICD-10-CM | POA: Diagnosis not present

## 2015-10-25 DIAGNOSIS — Z7982 Long term (current) use of aspirin: Secondary | ICD-10-CM | POA: Diagnosis not present

## 2015-10-25 DIAGNOSIS — Z87891 Personal history of nicotine dependence: Secondary | ICD-10-CM | POA: Diagnosis not present

## 2015-10-25 DIAGNOSIS — I2510 Atherosclerotic heart disease of native coronary artery without angina pectoris: Secondary | ICD-10-CM | POA: Diagnosis not present

## 2015-10-25 DIAGNOSIS — I10 Essential (primary) hypertension: Secondary | ICD-10-CM | POA: Diagnosis not present

## 2015-10-25 DIAGNOSIS — E785 Hyperlipidemia, unspecified: Secondary | ICD-10-CM | POA: Diagnosis not present

## 2015-10-25 DIAGNOSIS — Z955 Presence of coronary angioplasty implant and graft: Secondary | ICD-10-CM | POA: Diagnosis not present

## 2015-10-25 DIAGNOSIS — F1011 Alcohol abuse, in remission: Secondary | ICD-10-CM | POA: Diagnosis not present

## 2015-10-26 DIAGNOSIS — J449 Chronic obstructive pulmonary disease, unspecified: Secondary | ICD-10-CM | POA: Diagnosis not present

## 2015-10-26 DIAGNOSIS — F319 Bipolar disorder, unspecified: Secondary | ICD-10-CM | POA: Diagnosis not present

## 2015-10-26 DIAGNOSIS — I252 Old myocardial infarction: Secondary | ICD-10-CM | POA: Diagnosis not present

## 2015-10-26 DIAGNOSIS — I10 Essential (primary) hypertension: Secondary | ICD-10-CM | POA: Diagnosis not present

## 2015-10-26 DIAGNOSIS — I2510 Atherosclerotic heart disease of native coronary artery without angina pectoris: Secondary | ICD-10-CM | POA: Diagnosis not present

## 2015-10-26 DIAGNOSIS — R296 Repeated falls: Secondary | ICD-10-CM | POA: Diagnosis not present

## 2015-10-27 DIAGNOSIS — J449 Chronic obstructive pulmonary disease, unspecified: Secondary | ICD-10-CM | POA: Diagnosis not present

## 2015-10-27 DIAGNOSIS — I10 Essential (primary) hypertension: Secondary | ICD-10-CM | POA: Diagnosis not present

## 2015-10-27 DIAGNOSIS — R296 Repeated falls: Secondary | ICD-10-CM | POA: Diagnosis not present

## 2015-10-27 DIAGNOSIS — I252 Old myocardial infarction: Secondary | ICD-10-CM | POA: Diagnosis not present

## 2015-10-27 DIAGNOSIS — F319 Bipolar disorder, unspecified: Secondary | ICD-10-CM | POA: Diagnosis not present

## 2015-10-27 DIAGNOSIS — I2510 Atherosclerotic heart disease of native coronary artery without angina pectoris: Secondary | ICD-10-CM | POA: Diagnosis not present

## 2015-10-28 DIAGNOSIS — R296 Repeated falls: Secondary | ICD-10-CM | POA: Diagnosis not present

## 2015-10-28 DIAGNOSIS — I2510 Atherosclerotic heart disease of native coronary artery without angina pectoris: Secondary | ICD-10-CM | POA: Diagnosis not present

## 2015-10-28 DIAGNOSIS — I10 Essential (primary) hypertension: Secondary | ICD-10-CM | POA: Diagnosis not present

## 2015-10-28 DIAGNOSIS — I252 Old myocardial infarction: Secondary | ICD-10-CM | POA: Diagnosis not present

## 2015-10-28 DIAGNOSIS — F319 Bipolar disorder, unspecified: Secondary | ICD-10-CM | POA: Diagnosis not present

## 2015-10-28 DIAGNOSIS — J449 Chronic obstructive pulmonary disease, unspecified: Secondary | ICD-10-CM | POA: Diagnosis not present

## 2015-10-29 DIAGNOSIS — L03115 Cellulitis of right lower limb: Secondary | ICD-10-CM | POA: Diagnosis not present

## 2015-10-30 DIAGNOSIS — I2510 Atherosclerotic heart disease of native coronary artery without angina pectoris: Secondary | ICD-10-CM | POA: Diagnosis not present

## 2015-10-30 DIAGNOSIS — F319 Bipolar disorder, unspecified: Secondary | ICD-10-CM | POA: Diagnosis not present

## 2015-10-30 DIAGNOSIS — I10 Essential (primary) hypertension: Secondary | ICD-10-CM | POA: Diagnosis not present

## 2015-10-30 DIAGNOSIS — J449 Chronic obstructive pulmonary disease, unspecified: Secondary | ICD-10-CM | POA: Diagnosis not present

## 2015-10-30 DIAGNOSIS — I252 Old myocardial infarction: Secondary | ICD-10-CM | POA: Diagnosis not present

## 2015-10-30 DIAGNOSIS — R296 Repeated falls: Secondary | ICD-10-CM | POA: Diagnosis not present

## 2015-10-31 ENCOUNTER — Other Ambulatory Visit: Payer: Self-pay | Admitting: Physician Assistant

## 2015-10-31 DIAGNOSIS — L03115 Cellulitis of right lower limb: Secondary | ICD-10-CM

## 2015-10-31 DIAGNOSIS — R58 Hemorrhage, not elsewhere classified: Secondary | ICD-10-CM

## 2015-11-02 DIAGNOSIS — I2510 Atherosclerotic heart disease of native coronary artery without angina pectoris: Secondary | ICD-10-CM | POA: Diagnosis not present

## 2015-11-02 DIAGNOSIS — I10 Essential (primary) hypertension: Secondary | ICD-10-CM | POA: Diagnosis not present

## 2015-11-02 DIAGNOSIS — I252 Old myocardial infarction: Secondary | ICD-10-CM | POA: Diagnosis not present

## 2015-11-02 DIAGNOSIS — J449 Chronic obstructive pulmonary disease, unspecified: Secondary | ICD-10-CM | POA: Diagnosis not present

## 2015-11-02 DIAGNOSIS — F319 Bipolar disorder, unspecified: Secondary | ICD-10-CM | POA: Diagnosis not present

## 2015-11-02 DIAGNOSIS — R296 Repeated falls: Secondary | ICD-10-CM | POA: Diagnosis not present

## 2015-11-03 DIAGNOSIS — F319 Bipolar disorder, unspecified: Secondary | ICD-10-CM | POA: Diagnosis not present

## 2015-11-03 DIAGNOSIS — R296 Repeated falls: Secondary | ICD-10-CM | POA: Diagnosis not present

## 2015-11-03 DIAGNOSIS — J449 Chronic obstructive pulmonary disease, unspecified: Secondary | ICD-10-CM | POA: Diagnosis not present

## 2015-11-03 DIAGNOSIS — I2510 Atherosclerotic heart disease of native coronary artery without angina pectoris: Secondary | ICD-10-CM | POA: Diagnosis not present

## 2015-11-03 DIAGNOSIS — I10 Essential (primary) hypertension: Secondary | ICD-10-CM | POA: Diagnosis not present

## 2015-11-03 DIAGNOSIS — I252 Old myocardial infarction: Secondary | ICD-10-CM | POA: Diagnosis not present

## 2015-11-04 DIAGNOSIS — F319 Bipolar disorder, unspecified: Secondary | ICD-10-CM | POA: Diagnosis not present

## 2015-11-04 DIAGNOSIS — J449 Chronic obstructive pulmonary disease, unspecified: Secondary | ICD-10-CM | POA: Diagnosis not present

## 2015-11-04 DIAGNOSIS — I2510 Atherosclerotic heart disease of native coronary artery without angina pectoris: Secondary | ICD-10-CM | POA: Diagnosis not present

## 2015-11-04 DIAGNOSIS — R296 Repeated falls: Secondary | ICD-10-CM | POA: Diagnosis not present

## 2015-11-04 DIAGNOSIS — I252 Old myocardial infarction: Secondary | ICD-10-CM | POA: Diagnosis not present

## 2015-11-04 DIAGNOSIS — I10 Essential (primary) hypertension: Secondary | ICD-10-CM | POA: Diagnosis not present

## 2015-11-05 ENCOUNTER — Other Ambulatory Visit: Payer: Self-pay | Admitting: Physician Assistant

## 2015-11-05 DIAGNOSIS — J449 Chronic obstructive pulmonary disease, unspecified: Secondary | ICD-10-CM | POA: Diagnosis not present

## 2015-11-05 DIAGNOSIS — I252 Old myocardial infarction: Secondary | ICD-10-CM | POA: Diagnosis not present

## 2015-11-05 DIAGNOSIS — I10 Essential (primary) hypertension: Secondary | ICD-10-CM | POA: Diagnosis not present

## 2015-11-05 DIAGNOSIS — R296 Repeated falls: Secondary | ICD-10-CM | POA: Diagnosis not present

## 2015-11-05 DIAGNOSIS — L03115 Cellulitis of right lower limb: Secondary | ICD-10-CM

## 2015-11-05 DIAGNOSIS — I2510 Atherosclerotic heart disease of native coronary artery without angina pectoris: Secondary | ICD-10-CM | POA: Diagnosis not present

## 2015-11-05 DIAGNOSIS — R58 Hemorrhage, not elsewhere classified: Secondary | ICD-10-CM

## 2015-11-05 DIAGNOSIS — F319 Bipolar disorder, unspecified: Secondary | ICD-10-CM | POA: Diagnosis not present

## 2015-11-06 ENCOUNTER — Other Ambulatory Visit: Payer: Medicare Other

## 2015-11-09 DIAGNOSIS — F319 Bipolar disorder, unspecified: Secondary | ICD-10-CM | POA: Diagnosis not present

## 2015-11-09 DIAGNOSIS — J449 Chronic obstructive pulmonary disease, unspecified: Secondary | ICD-10-CM | POA: Diagnosis not present

## 2015-11-09 DIAGNOSIS — R296 Repeated falls: Secondary | ICD-10-CM | POA: Diagnosis not present

## 2015-11-09 DIAGNOSIS — I2510 Atherosclerotic heart disease of native coronary artery without angina pectoris: Secondary | ICD-10-CM | POA: Diagnosis not present

## 2015-11-09 DIAGNOSIS — I10 Essential (primary) hypertension: Secondary | ICD-10-CM | POA: Diagnosis not present

## 2015-11-09 DIAGNOSIS — I252 Old myocardial infarction: Secondary | ICD-10-CM | POA: Diagnosis not present

## 2015-11-11 DIAGNOSIS — I252 Old myocardial infarction: Secondary | ICD-10-CM | POA: Diagnosis not present

## 2015-11-11 DIAGNOSIS — R296 Repeated falls: Secondary | ICD-10-CM | POA: Diagnosis not present

## 2015-11-11 DIAGNOSIS — I2510 Atherosclerotic heart disease of native coronary artery without angina pectoris: Secondary | ICD-10-CM | POA: Diagnosis not present

## 2015-11-11 DIAGNOSIS — F319 Bipolar disorder, unspecified: Secondary | ICD-10-CM | POA: Diagnosis not present

## 2015-11-11 DIAGNOSIS — I10 Essential (primary) hypertension: Secondary | ICD-10-CM | POA: Diagnosis not present

## 2015-11-11 DIAGNOSIS — J449 Chronic obstructive pulmonary disease, unspecified: Secondary | ICD-10-CM | POA: Diagnosis not present

## 2015-11-16 DIAGNOSIS — I252 Old myocardial infarction: Secondary | ICD-10-CM | POA: Diagnosis not present

## 2015-11-16 DIAGNOSIS — J449 Chronic obstructive pulmonary disease, unspecified: Secondary | ICD-10-CM | POA: Diagnosis not present

## 2015-11-16 DIAGNOSIS — R296 Repeated falls: Secondary | ICD-10-CM | POA: Diagnosis not present

## 2015-11-16 DIAGNOSIS — I10 Essential (primary) hypertension: Secondary | ICD-10-CM | POA: Diagnosis not present

## 2015-11-16 DIAGNOSIS — I2510 Atherosclerotic heart disease of native coronary artery without angina pectoris: Secondary | ICD-10-CM | POA: Diagnosis not present

## 2015-11-16 DIAGNOSIS — F319 Bipolar disorder, unspecified: Secondary | ICD-10-CM | POA: Diagnosis not present

## 2015-11-17 ENCOUNTER — Ambulatory Visit
Admission: RE | Admit: 2015-11-17 | Discharge: 2015-11-17 | Disposition: A | Discharge status: Home / Self Care | Payer: Medicare Other | Source: Ambulatory Visit | Attending: Physician Assistant | Admitting: Physician Assistant

## 2015-11-17 DIAGNOSIS — M7989 Other specified soft tissue disorders: Secondary | ICD-10-CM | POA: Diagnosis not present

## 2015-11-17 DIAGNOSIS — L03115 Cellulitis of right lower limb: Secondary | ICD-10-CM

## 2015-11-17 DIAGNOSIS — R58 Hemorrhage, not elsewhere classified: Secondary | ICD-10-CM

## 2015-11-18 DIAGNOSIS — R296 Repeated falls: Secondary | ICD-10-CM | POA: Diagnosis not present

## 2015-11-18 DIAGNOSIS — F319 Bipolar disorder, unspecified: Secondary | ICD-10-CM | POA: Diagnosis not present

## 2015-11-18 DIAGNOSIS — J449 Chronic obstructive pulmonary disease, unspecified: Secondary | ICD-10-CM | POA: Diagnosis not present

## 2015-11-18 DIAGNOSIS — I252 Old myocardial infarction: Secondary | ICD-10-CM | POA: Diagnosis not present

## 2015-11-18 DIAGNOSIS — I2510 Atherosclerotic heart disease of native coronary artery without angina pectoris: Secondary | ICD-10-CM | POA: Diagnosis not present

## 2015-11-18 DIAGNOSIS — I10 Essential (primary) hypertension: Secondary | ICD-10-CM | POA: Diagnosis not present

## 2015-11-19 DIAGNOSIS — L638 Other alopecia areata: Secondary | ICD-10-CM | POA: Diagnosis not present

## 2015-11-20 DIAGNOSIS — M5136 Other intervertebral disc degeneration, lumbar region: Secondary | ICD-10-CM | POA: Diagnosis not present

## 2015-11-20 DIAGNOSIS — Z79891 Long term (current) use of opiate analgesic: Secondary | ICD-10-CM | POA: Diagnosis not present

## 2015-11-20 DIAGNOSIS — G894 Chronic pain syndrome: Secondary | ICD-10-CM | POA: Diagnosis not present

## 2015-11-23 DIAGNOSIS — I2510 Atherosclerotic heart disease of native coronary artery without angina pectoris: Secondary | ICD-10-CM | POA: Diagnosis not present

## 2015-11-23 DIAGNOSIS — R296 Repeated falls: Secondary | ICD-10-CM | POA: Diagnosis not present

## 2015-11-23 DIAGNOSIS — I252 Old myocardial infarction: Secondary | ICD-10-CM | POA: Diagnosis not present

## 2015-11-23 DIAGNOSIS — J449 Chronic obstructive pulmonary disease, unspecified: Secondary | ICD-10-CM | POA: Diagnosis not present

## 2015-11-23 DIAGNOSIS — I10 Essential (primary) hypertension: Secondary | ICD-10-CM | POA: Diagnosis not present

## 2015-11-23 DIAGNOSIS — F319 Bipolar disorder, unspecified: Secondary | ICD-10-CM | POA: Diagnosis not present

## 2015-11-25 DIAGNOSIS — I2510 Atherosclerotic heart disease of native coronary artery without angina pectoris: Secondary | ICD-10-CM | POA: Diagnosis not present

## 2015-11-25 DIAGNOSIS — J449 Chronic obstructive pulmonary disease, unspecified: Secondary | ICD-10-CM | POA: Diagnosis not present

## 2015-11-25 DIAGNOSIS — F319 Bipolar disorder, unspecified: Secondary | ICD-10-CM | POA: Diagnosis not present

## 2015-11-25 DIAGNOSIS — I252 Old myocardial infarction: Secondary | ICD-10-CM | POA: Diagnosis not present

## 2015-11-25 DIAGNOSIS — I10 Essential (primary) hypertension: Secondary | ICD-10-CM | POA: Diagnosis not present

## 2015-11-25 DIAGNOSIS — R296 Repeated falls: Secondary | ICD-10-CM | POA: Diagnosis not present

## 2015-11-27 DIAGNOSIS — S8011XD Contusion of right lower leg, subsequent encounter: Secondary | ICD-10-CM | POA: Diagnosis not present

## 2015-11-27 DIAGNOSIS — S20212D Contusion of left front wall of thorax, subsequent encounter: Secondary | ICD-10-CM | POA: Diagnosis not present

## 2015-11-29 DIAGNOSIS — F319 Bipolar disorder, unspecified: Secondary | ICD-10-CM | POA: Diagnosis not present

## 2015-11-29 DIAGNOSIS — I252 Old myocardial infarction: Secondary | ICD-10-CM | POA: Diagnosis not present

## 2015-11-29 DIAGNOSIS — I10 Essential (primary) hypertension: Secondary | ICD-10-CM | POA: Diagnosis not present

## 2015-11-29 DIAGNOSIS — R296 Repeated falls: Secondary | ICD-10-CM | POA: Diagnosis not present

## 2015-11-29 DIAGNOSIS — I2510 Atherosclerotic heart disease of native coronary artery without angina pectoris: Secondary | ICD-10-CM | POA: Diagnosis not present

## 2015-11-29 DIAGNOSIS — J449 Chronic obstructive pulmonary disease, unspecified: Secondary | ICD-10-CM | POA: Diagnosis not present

## 2015-12-01 DIAGNOSIS — I252 Old myocardial infarction: Secondary | ICD-10-CM | POA: Diagnosis not present

## 2015-12-01 DIAGNOSIS — F319 Bipolar disorder, unspecified: Secondary | ICD-10-CM | POA: Diagnosis not present

## 2015-12-01 DIAGNOSIS — I10 Essential (primary) hypertension: Secondary | ICD-10-CM | POA: Diagnosis not present

## 2015-12-01 DIAGNOSIS — R296 Repeated falls: Secondary | ICD-10-CM | POA: Diagnosis not present

## 2015-12-01 DIAGNOSIS — J449 Chronic obstructive pulmonary disease, unspecified: Secondary | ICD-10-CM | POA: Diagnosis not present

## 2015-12-01 DIAGNOSIS — I2510 Atherosclerotic heart disease of native coronary artery without angina pectoris: Secondary | ICD-10-CM | POA: Diagnosis not present

## 2015-12-04 ENCOUNTER — Encounter: Payer: Self-pay | Admitting: Radiation Oncology

## 2015-12-04 ENCOUNTER — Ambulatory Visit
Admission: RE | Admit: 2015-12-04 | Discharge: 2015-12-04 | Disposition: A | Discharge status: Home / Self Care | Payer: Medicare Other | Source: Ambulatory Visit | Attending: Radiation Oncology | Admitting: Radiation Oncology

## 2015-12-04 VITALS — BP 113/75 | HR 54 | Temp 97.7°F | Ht 63.0 in | Wt 164.4 lb

## 2015-12-04 DIAGNOSIS — C52 Malignant neoplasm of vagina: Secondary | ICD-10-CM | POA: Diagnosis not present

## 2015-12-04 DIAGNOSIS — Z08 Encounter for follow-up examination after completed treatment for malignant neoplasm: Secondary | ICD-10-CM | POA: Diagnosis not present

## 2015-12-04 DIAGNOSIS — Z8544 Personal history of malignant neoplasm of other female genital organs: Secondary | ICD-10-CM | POA: Diagnosis not present

## 2015-12-04 DIAGNOSIS — C539 Malignant neoplasm of cervix uteri, unspecified: Secondary | ICD-10-CM

## 2015-12-04 NOTE — Progress Notes (Signed)
[name] presents for follow up of radiation 02/21/12 to her Vaginal Cuff in 5 fractions. She reports chronic back pain. She is taking about 4 oxycodone daily for this pain with some relief. She denies vaginal pain or discharge. She is using her vaginal dilator about 2-3 times a week. She reports some constipation, but is using daily laxatives to help with this. She saw Dr. Alycia Rossetti on 06/03/15 and had a pelvic exam and PAP smear performed at that time. She has no other concerns at this time.   BP 113/75   Pulse (!) 54   Temp 97.7 F (36.5 C)   Ht 5\' 3"  (1.6 m)   Wt 164 lb 6.4 oz (74.6 kg)   SpO2 94% Comment: room air  BMI 29.12 kg/m    Wt Readings from Last 3 Encounters:  12/04/15 164 lb 6.4 oz (74.6 kg)  06/03/15 157 lb 4.8 oz (71.4 kg)  03/02/15 153 lb 5 oz (69.5 kg)

## 2015-12-04 NOTE — Progress Notes (Signed)
Radiation Oncology         [phone] ________________________________  Name: [name] MRN: [id number]  Date: 12/04/2015  DOB: 04-27-1945  Follow-Up Visit Note  Outpatient    ICD-9-CM ICD-10-CM   1. Cancer of vaginal vault - recurrent from cervix 184.0 C52   2. 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 She completed treatment on 02/21/2012  Narrative:  The patient returns today for routine follow-up of radiation completed 02/21/12 to her vaginal area. She reports back pain which is chronic in nature, beginning approximately 6-7 years ago. She is taking approximately 4 oxycodone daily for this pain, which provides some relief. She denies vaginal pain or discharge. The patient reports she is using her vaginal dilator about 2-3 times a week. She reports some constipation, but is using daily laxatives to help with this. She additionally reports some chronic bowel incontinence for which she wears Depends. The patient denies any bladder concerns. The patient saw Dr. Alycia Rossetti on 06/03/15 and had a pelvic exam and PAP smear performed at that time; results were normal. She reports no other concerns at this time.                           ALLERGIES:  has No Known Allergies.  Meds: Current Outpatient Prescriptions  Medication Sig Dispense Refill  . albuterol (PROVENTIL HFA;VENTOLIN HFA) 108 (90 BASE) MCG/ACT inhaler Inhale 2 puffs into the lungs every 6 (six) hours as needed for wheezing.    Marland Kitchen amLODipine (NORVASC) 2.5 MG tablet Take 1 tablet (2.5 mg total) by mouth daily. (Patient taking  differently: Take 5 mg by mouth daily. ) 30 tablet 11  . ARIPiprazole (ABILIFY) 20 MG tablet Take 20 mg by mouth every morning.    Marland Kitchen aspirin EC 81 MG tablet Take 81 mg by mouth every morning.     . Calcium Carbonate-Vitamin D 600-400 MG-UNIT per tablet Take 1 tablet by mouth 2 (two) times daily.     . Cholecalciferol (VITAMIN D3) 2000 UNITS TABS Take 2,000 Units by mouth 2 (two) times daily.     . clonazePAM (KLONOPIN) 1 MG tablet Take 1 mg by mouth 3 (three) times daily as needed for anxiety.     . cloNIDine (CATAPRES) 0.2 MG tablet Take 0.2 mg by mouth 2 (two) times daily.     Marland Kitchen conjugated estrogens (PREMARIN) vaginal cream Place 1 Applicatorful vaginally 3 (three) times a week.     . cycloSPORINE (RESTASIS) 0.05 % ophthalmic emulsion Place 1 drop into both eyes 2 (two) times daily.    Marland Kitchen docusate sodium (COLACE) 100 MG capsule Take 1 capsule (100 mg total) by mouth 2 (two) times daily. 30 capsule 0  . esomeprazole (NEXIUM) 20 MG capsule Take 20 mg by mouth daily before breakfast.    . furosemide (LASIX) 20 MG tablet Take 20 mg by mouth every morning.    . lamoTRIgine (LAMICTAL) 100 MG tablet Take 200 mg by mouth 2 (two) times daily.     . mometasone-formoterol (DULERA) 100-5 MCG/ACT AERO  Inhale 2 puffs into the lungs 2 (two) times daily. 1 Inhaler 3  . Multiple Vitamin (MULTIVITAMIN) capsule Take 1 capsule by mouth every morning.     . nitroGLYCERIN (NITROSTAT) 0.4 MG SL tablet Place 1 tablet (0.4 mg total) under the tongue every 5 (five) minutes as needed for chest pain. 90 tablet 3  . olmesartan (BENICAR) 40 MG tablet Take 40 mg by mouth every morning.    Marland Kitchen oxyCODONE-acetaminophen (PERCOCET) 10-325 MG per tablet Take 1 tablet by mouth every 4 (four) hours.     . polyethylene glycol (MIRALAX / GLYCOLAX) packet Take 17 g by mouth daily as needed for mild constipation or moderate constipation.     . potassium chloride (KLOR-CON) 8 MEQ tablet Take 8 mEq by mouth every morning.     . rosuvastatin  (CRESTOR) 5 MG tablet Take 5 mg by mouth at bedtime.    . sertraline (ZOLOFT) 50 MG tablet Take 50 mg by mouth every morning.     . tiotropium (SPIRIVA) 18 MCG inhalation capsule Place 18 mcg into inhaler and inhale every evening.     . Lubricants (SURGILUBE EX) Place 1 application vaginally daily before supper.      No current facility-administered medications for this encounter.     Physical Findings: Gen" The patient is in no acute distress. Patient is alert and oriented.  height is 5\' 3"  (1.6 m) and weight is 164 lb 6.4 oz (74.6 kg). Her temperature is 97.7 F (36.5 C). Her blood pressure is 113/75 and her pulse is 54 (abnormal). Her oxygen saturation is 94%. Marland Kitchen    HEENT Oral cavity and oropharynx are clear. NECK No palpable masses in the neck. EXT No edema noted in the extremities. Heart regular in rate and rhythm. Chest is clear. Abdomen is soft and non distended; no tenderness to palpation. LYMPH Groin had no palpable masses. GYN External genitalia appears healthy. On digital exam, no palpable lesions in the vagina. No visible lesions in the vaginal vault by speculum exam. RECTAL No palpable fistulas or lesions appreciated in the rectum or vaginal vault. PSYCH: blunted affect, alert and conversant, pleasant to speak with  Lab Findings: Lab Results  Component Value Date   WBC 5.6 07/02/2012   HGB 9.7 (L) 07/12/2012   HCT 28.7 (L) 07/12/2012   MCV 87.3 07/02/2012   PLT 323 07/02/2012    Radiographic Findings: No results found.  Impression/Plan: NED.  No new symptoms. We will follow up in 12 months. She knows to call earlier, prn.   Eppie Gibson, MD   This document serves as a record of services personally performed by Eppie Gibson, MD. It was created on her behalf by Maryla Morrow, a trained medical scribe. The creation of this record is based on the scribe's personal observations and the provider's statements to them. This document has been checked and approved by the  attending provider.

## 2015-12-06 DIAGNOSIS — I10 Essential (primary) hypertension: Secondary | ICD-10-CM | POA: Diagnosis not present

## 2015-12-06 DIAGNOSIS — I2510 Atherosclerotic heart disease of native coronary artery without angina pectoris: Secondary | ICD-10-CM | POA: Diagnosis not present

## 2015-12-06 DIAGNOSIS — F319 Bipolar disorder, unspecified: Secondary | ICD-10-CM | POA: Diagnosis not present

## 2015-12-06 DIAGNOSIS — J449 Chronic obstructive pulmonary disease, unspecified: Secondary | ICD-10-CM | POA: Diagnosis not present

## 2015-12-06 DIAGNOSIS — I252 Old myocardial infarction: Secondary | ICD-10-CM | POA: Diagnosis not present

## 2015-12-06 DIAGNOSIS — R296 Repeated falls: Secondary | ICD-10-CM | POA: Diagnosis not present

## 2015-12-08 DIAGNOSIS — I2510 Atherosclerotic heart disease of native coronary artery without angina pectoris: Secondary | ICD-10-CM | POA: Diagnosis not present

## 2015-12-08 DIAGNOSIS — R296 Repeated falls: Secondary | ICD-10-CM | POA: Diagnosis not present

## 2015-12-08 DIAGNOSIS — I10 Essential (primary) hypertension: Secondary | ICD-10-CM | POA: Diagnosis not present

## 2015-12-08 DIAGNOSIS — I252 Old myocardial infarction: Secondary | ICD-10-CM | POA: Diagnosis not present

## 2015-12-08 DIAGNOSIS — F319 Bipolar disorder, unspecified: Secondary | ICD-10-CM | POA: Diagnosis not present

## 2015-12-08 DIAGNOSIS — J449 Chronic obstructive pulmonary disease, unspecified: Secondary | ICD-10-CM | POA: Diagnosis not present

## 2015-12-21 DIAGNOSIS — G518 Other disorders of facial nerve: Secondary | ICD-10-CM | POA: Diagnosis not present

## 2016-01-01 DIAGNOSIS — M5416 Radiculopathy, lumbar region: Secondary | ICD-10-CM | POA: Diagnosis not present

## 2016-01-01 DIAGNOSIS — G894 Chronic pain syndrome: Secondary | ICD-10-CM | POA: Diagnosis not present

## 2016-01-08 DIAGNOSIS — Z79891 Long term (current) use of opiate analgesic: Secondary | ICD-10-CM | POA: Diagnosis not present

## 2016-01-08 DIAGNOSIS — M5136 Other intervertebral disc degeneration, lumbar region: Secondary | ICD-10-CM | POA: Diagnosis not present

## 2016-01-08 DIAGNOSIS — G894 Chronic pain syndrome: Secondary | ICD-10-CM | POA: Diagnosis not present

## 2016-01-26 DIAGNOSIS — G250 Essential tremor: Secondary | ICD-10-CM | POA: Diagnosis not present

## 2016-01-26 DIAGNOSIS — I10 Essential (primary) hypertension: Secondary | ICD-10-CM | POA: Diagnosis not present

## 2016-01-26 DIAGNOSIS — S20212D Contusion of left front wall of thorax, subsequent encounter: Secondary | ICD-10-CM | POA: Diagnosis not present

## 2016-01-26 DIAGNOSIS — F319 Bipolar disorder, unspecified: Secondary | ICD-10-CM | POA: Diagnosis not present

## 2016-01-26 DIAGNOSIS — E785 Hyperlipidemia, unspecified: Secondary | ICD-10-CM | POA: Diagnosis not present

## 2016-02-11 DIAGNOSIS — Z79891 Long term (current) use of opiate analgesic: Secondary | ICD-10-CM | POA: Diagnosis not present

## 2016-02-11 DIAGNOSIS — G894 Chronic pain syndrome: Secondary | ICD-10-CM | POA: Diagnosis not present

## 2016-02-11 DIAGNOSIS — M5136 Other intervertebral disc degeneration, lumbar region: Secondary | ICD-10-CM | POA: Diagnosis not present

## 2016-03-11 DIAGNOSIS — M25562 Pain in left knee: Secondary | ICD-10-CM | POA: Diagnosis not present

## 2016-03-11 DIAGNOSIS — M1712 Unilateral primary osteoarthritis, left knee: Secondary | ICD-10-CM | POA: Diagnosis not present

## 2016-03-11 DIAGNOSIS — M545 Low back pain: Secondary | ICD-10-CM | POA: Diagnosis not present

## 2016-03-15 DIAGNOSIS — G518 Other disorders of facial nerve: Secondary | ICD-10-CM | POA: Diagnosis not present

## 2016-04-11 DIAGNOSIS — H01005 Unspecified blepharitis left lower eyelid: Secondary | ICD-10-CM | POA: Diagnosis not present

## 2016-04-11 DIAGNOSIS — H04123 Dry eye syndrome of bilateral lacrimal glands: Secondary | ICD-10-CM | POA: Diagnosis not present

## 2016-04-11 DIAGNOSIS — H01004 Unspecified blepharitis left upper eyelid: Secondary | ICD-10-CM | POA: Diagnosis not present

## 2016-04-11 DIAGNOSIS — H01002 Unspecified blepharitis right lower eyelid: Secondary | ICD-10-CM | POA: Diagnosis not present

## 2016-04-11 DIAGNOSIS — H01001 Unspecified blepharitis right upper eyelid: Secondary | ICD-10-CM | POA: Diagnosis not present

## 2016-04-14 DIAGNOSIS — H532 Diplopia: Secondary | ICD-10-CM | POA: Diagnosis not present

## 2016-04-14 DIAGNOSIS — H5000 Unspecified esotropia: Secondary | ICD-10-CM | POA: Diagnosis not present

## 2016-04-14 DIAGNOSIS — H5021 Vertical strabismus, right eye: Secondary | ICD-10-CM | POA: Diagnosis not present

## 2016-04-14 DIAGNOSIS — H5213 Myopia, bilateral: Secondary | ICD-10-CM | POA: Diagnosis not present

## 2016-04-26 DIAGNOSIS — E785 Hyperlipidemia, unspecified: Secondary | ICD-10-CM | POA: Diagnosis not present

## 2016-04-26 DIAGNOSIS — F319 Bipolar disorder, unspecified: Secondary | ICD-10-CM | POA: Diagnosis not present

## 2016-04-26 DIAGNOSIS — I10 Essential (primary) hypertension: Secondary | ICD-10-CM | POA: Diagnosis not present

## 2016-04-26 DIAGNOSIS — M5136 Other intervertebral disc degeneration, lumbar region: Secondary | ICD-10-CM | POA: Diagnosis not present

## 2016-05-02 DIAGNOSIS — L218 Other seborrheic dermatitis: Secondary | ICD-10-CM | POA: Diagnosis not present

## 2016-05-02 DIAGNOSIS — L638 Other alopecia areata: Secondary | ICD-10-CM | POA: Diagnosis not present

## 2016-05-10 DIAGNOSIS — M5136 Other intervertebral disc degeneration, lumbar region: Secondary | ICD-10-CM | POA: Diagnosis not present

## 2016-05-10 DIAGNOSIS — Z79891 Long term (current) use of opiate analgesic: Secondary | ICD-10-CM | POA: Diagnosis not present

## 2016-06-01 ENCOUNTER — Other Ambulatory Visit: Payer: Self-pay | Admitting: Gynecologic Oncology

## 2016-06-01 ENCOUNTER — Ambulatory Visit: Payer: Medicare Other | Attending: Gynecologic Oncology | Admitting: Gynecologic Oncology

## 2016-06-01 ENCOUNTER — Encounter: Payer: Self-pay | Admitting: Gynecologic Oncology

## 2016-06-01 ENCOUNTER — Other Ambulatory Visit (HOSPITAL_COMMUNITY)
Admission: RE | Admit: 2016-06-01 | Discharge: 2016-06-01 | Disposition: A | Discharge status: Home / Self Care | Payer: Medicare Other | Source: Ambulatory Visit | Attending: Gynecologic Oncology | Admitting: Gynecologic Oncology

## 2016-06-01 VITALS — BP 145/85 | HR 61 | Resp 20 | Wt 170.7 lb

## 2016-06-01 DIAGNOSIS — I2510 Atherosclerotic heart disease of native coronary artery without angina pectoris: Secondary | ICD-10-CM | POA: Diagnosis not present

## 2016-06-01 DIAGNOSIS — I739 Peripheral vascular disease, unspecified: Secondary | ICD-10-CM | POA: Diagnosis not present

## 2016-06-01 DIAGNOSIS — Z01411 Encounter for gynecological examination (general) (routine) with abnormal findings: Secondary | ICD-10-CM | POA: Insufficient documentation

## 2016-06-01 DIAGNOSIS — I10 Essential (primary) hypertension: Secondary | ICD-10-CM | POA: Insufficient documentation

## 2016-06-01 DIAGNOSIS — Z85528 Personal history of other malignant neoplasm of kidney: Secondary | ICD-10-CM | POA: Insufficient documentation

## 2016-06-01 DIAGNOSIS — Z87891 Personal history of nicotine dependence: Secondary | ICD-10-CM | POA: Diagnosis not present

## 2016-06-01 DIAGNOSIS — Z955 Presence of coronary angioplasty implant and graft: Secondary | ICD-10-CM | POA: Insufficient documentation

## 2016-06-01 DIAGNOSIS — J439 Emphysema, unspecified: Secondary | ICD-10-CM | POA: Insufficient documentation

## 2016-06-01 DIAGNOSIS — Z79899 Other long term (current) drug therapy: Secondary | ICD-10-CM | POA: Diagnosis not present

## 2016-06-01 DIAGNOSIS — F319 Bipolar disorder, unspecified: Secondary | ICD-10-CM | POA: Insufficient documentation

## 2016-06-01 DIAGNOSIS — F419 Anxiety disorder, unspecified: Secondary | ICD-10-CM | POA: Insufficient documentation

## 2016-06-01 DIAGNOSIS — Z7982 Long term (current) use of aspirin: Secondary | ICD-10-CM | POA: Insufficient documentation

## 2016-06-01 DIAGNOSIS — C52 Malignant neoplasm of vagina: Secondary | ICD-10-CM

## 2016-06-01 DIAGNOSIS — I252 Old myocardial infarction: Secondary | ICD-10-CM | POA: Insufficient documentation

## 2016-06-01 DIAGNOSIS — Z8542 Personal history of malignant neoplasm of other parts of uterus: Secondary | ICD-10-CM | POA: Insufficient documentation

## 2016-06-01 NOTE — Progress Notes (Signed)
Consult Note: Gyn-Onc  [name] 71 y.o. female  CC:  Chief Complaint  Patient presents with  . Vaginal Cancer    HPI: [name] is a very pleasant 40 year old with a 10 year history of abnormal Pap smears. In April 2011 she had a Pap smear revealed atypical squamous cells cannot exclude a high-grade dysplasia. She was worked up and evaluated and subsequently underwent a vaginal hysterectomy in June of 2011. Pathology at that time revealed the cervix with high-grade squamous dysplasia. Focal adenomyosis. In addition, the ectocervical margin of the cervix was positive. She was seen by Dr. Deatra Ina on October 29 at which time a Pap smear revealed VAIN2/VAIN 3.   She underwent laser ablation on for dysplasia on February 07, 2010. Operative findings included a raised hyperkeratotic lesion in the vagina encompassing the entire vaginal cuff. The area was acetowhite changing. The total area measured approximately 4 x 4 centimeters.  I saw her in 2013 and her Pap smear was normal. She subsequently saw Dr. Deatra Ina October 30, 13 and her Pap smear returned as atypical squamous cells of undetermined significance. The patient has positive high-risk HPV.  I saw her in late 2013 acetowhite epithelial changes at the top of the vagina. However, there was a punctate area with telangiectasias in the upper right vaginal fornix. A biopsy of this was performed. It revealed a squamous cell carcinoma.   She completed 5 fractions of HDR with Dr. Lanell Persons in 2/14. She was ultimately diagnosed with a renal cell carcinoma and is status post a partial left nephrectomy 07/09/2012. The pathology revealed a T1-1 grade 2 clear cell renal cell carcinoma. The margins were negative and she was dispositioned to close followup. She comes in today for followup Pap smear. Pap smear performed in May of 2014 revealed atypical squamous cells of undetermined significance. However, she just finished her brachytherapy on February 4. I  last saw her 8/16 at which time her exam and Pap smear were negative. She was seen by Dr. Denman George secondary to development of a right buttock caruncle in December 2016. She had an I&D of that performed and did well. She comes in today for her routine surveillance.  Interval History: She comes in today for follow-up. I last saw her in May of last year which time her exam and Pap smear was negative. She was seen by Dr. Lanell Persons in November 2017. She comes in today. She is having more issues with back pain secondary to her scoliosis. She is taking pain medication more regularly and frequently. Her activity starting to be limited secondary to her pain. She wanted to talk to me about having her ovaries removed. She was toBe removed because she's having any symptoms or family history but she is fearful of developing ovarian cancer. We discussed that that is not typically done. There are no new medical problems and her family. Her review of systems is otherwise as below. She's not really using her dilator at all but she is continuing to use her vaginal estrogen. She is complaining of being somewhat bloated and gassy today but she is much more constipated recently. She can go home and take marrow laxative.  Review of Systems  Constitutional: No complaints, weight is stable Skin: No rash Cardiovascular: No chest pain, Occ sob, no edema  Pulmonary: No cough  Gastro Intestinal: No nausea, vomiting, + constipation. Genitourinary: No frequency, urgency, or dysuria.  Denies vaginal bleeding and discharge.   Musculoskeletal: + Back pain which radiates to her  legs bilaterally  Neurologic: No changes Psychology: No issues   Current Meds:  Outpatient Encounter Prescriptions as of 06/01/2016  Medication Sig  . albuterol (PROVENTIL HFA;VENTOLIN HFA) 108 (90 BASE) MCG/ACT inhaler Inhale 2 puffs into the lungs every 6 (six) hours as needed for wheezing.  Marland Kitchen amLODipine (NORVASC) 2.5 MG tablet Take 1 tablet (2.5 mg total)  by mouth daily. (Patient taking differently: Take 5 mg by mouth daily. )  . ARIPiprazole (ABILIFY) 20 MG tablet Take 20 mg by mouth every morning.  Marland Kitchen aspirin EC 81 MG tablet Take 81 mg by mouth every morning.   . Calcium Carbonate-Vitamin D 600-400 MG-UNIT per tablet Take 1 tablet by mouth 2 (two) times daily.   . Cholecalciferol (VITAMIN D3) 2000 UNITS TABS Take 2,000 Units by mouth 2 (two) times daily.   . clonazePAM (KLONOPIN) 1 MG tablet Take 1 mg by mouth 3 (three) times daily as needed for anxiety.   . cloNIDine (CATAPRES) 0.2 MG tablet Take 0.2 mg by mouth 2 (two) times daily.   Marland Kitchen conjugated estrogens (PREMARIN) vaginal cream Place 1 Applicatorful vaginally 3 (three) times a week.   . cycloSPORINE (RESTASIS) 0.05 % ophthalmic emulsion Place 1 drop into both eyes 2 (two) times daily.  Marland Kitchen docusate sodium (COLACE) 100 MG capsule Take 1 capsule (100 mg total) by mouth 2 (two) times daily.  Marland Kitchen esomeprazole (NEXIUM) 20 MG capsule Take 20 mg by mouth daily before breakfast.  . furosemide (LASIX) 20 MG tablet Take 20 mg by mouth every morning.  . lamoTRIgine (LAMICTAL) 100 MG tablet Take 200 mg by mouth 2 (two) times daily.   . Lubricants (SURGILUBE EX) Place 1 application vaginally daily before supper.   . mometasone-formoterol (DULERA) 100-5 MCG/ACT AERO Inhale 2 puffs into the lungs 2 (two) times daily.  . Multiple Vitamin (MULTIVITAMIN) capsule Take 1 capsule by mouth every morning.   . nitroGLYCERIN (NITROSTAT) 0.4 MG SL tablet Place 1 tablet (0.4 mg total) under the tongue every 5 (five) minutes as needed for chest pain.  Marland Kitchen olmesartan (BENICAR) 40 MG tablet Take 40 mg by mouth every morning.  Marland Kitchen oxyCODONE-acetaminophen (PERCOCET) 10-325 MG per tablet Take 1 tablet by mouth every 4 (four) hours.   . polyethylene glycol (MIRALAX / GLYCOLAX) packet Take 17 g by mouth daily as needed for mild constipation or moderate constipation.   . potassium chloride (KLOR-CON) 8 MEQ tablet Take 8 mEq by mouth  every morning.   . rosuvastatin (CRESTOR) 5 MG tablet Take 5 mg by mouth at bedtime.  . sertraline (ZOLOFT) 50 MG tablet Take 50 mg by mouth every morning.   . tiotropium (SPIRIVA) 18 MCG inhalation capsule Place 18 mcg into inhaler and inhale every evening.    No facility-administered encounter medications on file as of 06/01/2016.     Allergy: No Known Allergies  Social Hx:   Social History   Social History  . Marital status: Divorced    Spouse name: N/A  . Number of children: N/A  . Years of education: N/A   Occupational History  . Not on file.   Social History Main Topics  . Smoking status: Former Smoker    Packs/day: 2.00    Years: 50.00    Types: Cigarettes    Quit date: 01/18/2011  . Smokeless tobacco: Never Used     Comment: STATES QUIT SMOKING 01-18-2011  . Alcohol use No     Comment: RECOVERING ALCOHOLIC--   QUIT IN 2440  . Drug use:  No  . Sexual activity: No   Other Topics Concern  . Not on file   Social History Narrative  . No narrative on file    Past Surgical Hx:  Past Surgical History:  Procedure Laterality Date  . CAROTID ENDARTERECTOMY  1995   RIGHT  . CATARACT EXTRACTION W/ INTRAOCULAR LENS  IMPLANT, BILATERAL    . CERVICAL CONIZATION W/BX  09-23-2008  . CORONARY ANGIOPLASTY WITH STENT PLACEMENT  2000-   INFERIOR MI   X1 STENT TO RCA  . EUS N/A 03/05/2012   Procedure: FULL UPPER ENDOSCOPIC ULTRASOUND (EUS) RADIAL and EGD;  Surgeon: Milus Banister, MD;  Location: [facility];  Service: Endoscopy;  Laterality: N/A;  ercp scope first than eus scope  . HEMIARTHROPLASTY HIP  12-26-2008   LEFT FEMORAL NECK FX  . ORIF HIP FRACTURE  02-13-2007   RIGHT FEMORAL NECK FX  . ORIF RIGHT DISTAL RADIUS AND RIGHT PROXIMAL HUMEROUS NECK FX'S  10-10-2005  . RIGHT SHOULDER SURG.  2007  . ROBOTIC ASSITED PARTIAL NEPHRECTOMY Left 07/09/2012   Procedure: ROBOTIC ASSITED PARTIAL NEPHRECTOMY;  Surgeon: Dutch Gray, MD;  Location: [facility];  Service: Urology;   Laterality: Left;  . TOTAL HIP ARTHROPLASTY  04-15-2008   POST FAILED  RIGHT HIP ORIF FEMORAL FX  . TOTAL KNEE ARTHROPLASTY  09-22-2009   RIGHT  . UPPER RIGHT VAGINAL REGION  12/28/11   BIOPSY: SQUAMOUS CELL CARCINOMA  . VAGINAL HYSTERECTOMY  07-06-2009    Past Medical Hx:  Past Medical History:  Diagnosis Date  . Alcoholism ([place])    recovering since 2000  . Anxiety   . Arthritis BACK  . Bipolar disorder ([place])   . Blepharospasm LEFT EYE  . Blepharospasm    left  . Blood transfusion   . Cervical cancer ([place]) 12/28/11    vagina upper right bx==squamous cell ca  . Chronic back pain   . Coronary artery disease CARDIOLOGIST- DR [place]--- LAST VISIT NOTE 09-07-2009  W/ CHART  . Depression   . Emphysema   . History of alcohol abuse RECOVERING SINCE 2000  . HTN (hypertension)   . Hypertension   . Idiopathic acute facial nerve palsy LEFT SIDE--  BOTOX THERAPY  . Inferior MI ([place]) 2000--  POST PTCA W/ STENT X1  . Left renal mass 03/02/12  . Normal cardiac stress test 2008-- PER DR [place] NOTE 09-07-2009  . Osteoporosis   . Other and unspecified general anesthetics causing adverse effect in therapeutic use post op delirium--  last anes record w/ chart  from   09-22-2009 (spinal w/ light sedation)  . Peripheral vascular disease ([place]) POST RIGHT CAROTID SURG.  1995  . PONV (postoperative nausea and vomiting)   . Renal cell carcinoma ([place]) 07/09/12   Left mass  . Rosacea LEFT FACIAL RASH  . S/P radiation therapy 02/21/2012   38.75 Gy HDR 5 Fractions- vaginal cuff  . Seizures ([place])    x 1 after abrupt discontinuation of  Clonidine  . Status post carotid endarterectomy RIGHT --  1995  . Status post primary angioplasty with coronary stent 2000--  POST INFERIOR MI  . Unstable balance WALKS W/ CANE  . Vaginal cancer ([place])     Family Hx:  Family History  Problem Relation Age of Onset  . Hypertension Mother   . Hypertension Father     Vitals:  Blood pressure (!) 145/85, pulse 61,  resp. rate 20, weight 170 lb 11.2 oz (77.4 kg).  Physical Exam:  Well-nourished well-developed  female in no acute distress.   Neck: Supple, no lymphadenopathy, no thyromegaly.  Lungs: Clear to auscultation bilaterally.  Cardiac: Regular rate and rhythm  Abdomen: Soft, nontender, slightly distended and tympanitic. There is no palpable mass or prostatomegaly.   Groins: No lymphadenopathy.  Extremities: No edema.   Pelvic: Normal external female genitalia. Vagina is markedly atrophic. Vaginal cuff is visualized is no visible lesions except for radiation telangectasias. ThinPrep Pap was submitted without difficulty. Bimanual examination reveals no masses or nodularity. Rectal was deferred secondary to large amount of stool felt to the posterior wall of the vagina.  Assessment/Plan: 71 year old with a clinical stage I vaginal carcinoma treated with high-dose rate vaginal brachyherapy. She completed her treatment on February 21, 2012.  We'll followup in results for Pap smear from today. She will see Dr. Lanell Persons  in 6 months and return to see me in 12 months.   We discussed that there really isn't any reason to proceed with a prophylactic bilateral salpingo-oophorectomy as she has no symptoms and no family history. She was reassured hearing that that would not be of benefit to her. She wishes to stop using her dilators which I think is reasonable. She'll continue using her estrogen cream. Md Smola A., MD 06/01/2016, 12:30 PM

## 2016-06-01 NOTE — Patient Instructions (Signed)
We will notify you of the results of your Pap smear from today. Follow-up with Dr. Alycia Rossetti in one year and with Dr. Lanell Persons in 6 months.

## 2016-06-07 LAB — CYTOLOGY - PAP: Diagnosis: NEGATIVE

## 2016-06-09 ENCOUNTER — Telehealth: Payer: Self-pay

## 2016-06-09 NOTE — Telephone Encounter (Signed)
Pt notified about pap results: negative.  No questions or concerns voiced. 

## 2016-06-15 DIAGNOSIS — G518 Other disorders of facial nerve: Secondary | ICD-10-CM | POA: Diagnosis not present

## 2016-06-15 DIAGNOSIS — G514 Facial myokymia: Secondary | ICD-10-CM | POA: Diagnosis not present

## 2016-06-16 DIAGNOSIS — I10 Essential (primary) hypertension: Secondary | ICD-10-CM | POA: Diagnosis not present

## 2016-06-16 DIAGNOSIS — J069 Acute upper respiratory infection, unspecified: Secondary | ICD-10-CM | POA: Diagnosis not present

## 2016-06-16 DIAGNOSIS — F319 Bipolar disorder, unspecified: Secondary | ICD-10-CM | POA: Diagnosis not present

## 2016-06-16 DIAGNOSIS — E785 Hyperlipidemia, unspecified: Secondary | ICD-10-CM | POA: Diagnosis not present

## 2016-06-16 NOTE — Progress Notes (Signed)
[address] [place], [place]  [address] Phone: [phone] Fax:  [phone]  Date:  06/17/2016   ID:  [name], DOB May 21, 1945, MRN [id number]  PCP:  Harlan Stains, MD  Cardiologist:  Dr. Dereck Agerton [place]     History of Present Illness: [name] is a 71 y.o. female with a hx of CAD, s/p inferior MI in 2000 treated with stent to the RCA, carotid stenosis s/p R CEA in 1995, renal CA s/p partial L nephrectomy in 06/2012, HTN, HL, COPD, blepharospasm.  LexiScan Myoview (02/2011): No ischemia, mild apical thinning, EF 69%, normal study.  Echocardiogram (06/2012): EF 60-65%, normal wall motion, mild LAE, PASP 38.  Carotid US (03/2012): RICA 40-59%. Repeat dopplers 9/32 showed 35-57% LICA and <32% RICA stenosis.   On follow up today she reports a 6 day history of sinus congestion and cough. ? Allergies. She has no chest pain or SOB.   She remains sedentary. Trying to walk more with a walker. Reports fasting lab work done yesterday with Dr. Dema Severin.   Recent Labs: No results found for requested labs within last 8760 hours.  Wt Readings from Last 3 Encounters:  06/17/16 168 lb 9.6 oz (76.5 kg)  06/01/16 170 lb 11.2 oz (77.4 kg)  12/04/15 164 lb 6.4 oz (74.6 kg)     Past Medical History:  Diagnosis Date  . Alcoholism ([place])    recovering since 2000  . Anxiety   . Arthritis BACK  . Bipolar disorder ([place])   . Blepharospasm LEFT EYE  . Blepharospasm    left  . Blood transfusion   . Cervical cancer ([place]) 12/28/11    vagina upper right bx==squamous cell ca  . Chronic back pain   . Coronary artery disease CARDIOLOGIST- DR [place]--- LAST VISIT NOTE 09-07-2009  W/ CHART  . Depression   . Emphysema   . History of alcohol abuse RECOVERING SINCE 2000  . HTN (hypertension)   . Hypertension   . Idiopathic acute facial nerve palsy LEFT SIDE--  BOTOX THERAPY  . Inferior MI ([place]) 2000--  POST PTCA W/ STENT X1  . Left renal mass 03/02/12  . Normal cardiac stress test 2008--  PER DR [place] NOTE 09-07-2009  . Osteoporosis   . Other and unspecified general anesthetics causing adverse effect in therapeutic use post op delirium--  last anes record w/ chart  from   09-22-2009 (spinal w/ light sedation)  . Peripheral vascular disease ([place]) POST RIGHT CAROTID SURG.  1995  . PONV (postoperative nausea and vomiting)   . Renal cell carcinoma ([place]) 07/09/12   Left mass  . Rosacea LEFT FACIAL RASH  . S/P radiation therapy 02/21/2012   38.75 Gy HDR 5 Fractions- vaginal cuff  . Seizures ([place])    x 1 after abrupt discontinuation of  Clonidine  . Status post carotid endarterectomy RIGHT --  1995  . Status post primary angioplasty with coronary stent 2000--  POST INFERIOR MI  . Unstable balance WALKS W/ CANE  . Vaginal cancer [facility])     Current Outpatient Prescriptions  Medication Sig Dispense Refill  . albuterol (PROVENTIL HFA;VENTOLIN HFA) 108 (90 BASE) MCG/ACT inhaler Inhale 2 puffs into the lungs every 6 (six) hours as needed for wheezing.    Marland Kitchen amLODipine (NORVASC) 2.5 MG tablet Take 1 tablet (2.5 mg total) by mouth daily. (Patient taking differently: Take 5 mg by mouth daily. ) 30 tablet 11  . ARIPiprazole (ABILIFY) 20 MG tablet Take 20 mg  by mouth every morning.    Marland Kitchen aspirin EC 81 MG tablet Take 81 mg by mouth every morning.     . Calcium Carbonate-Vitamin D 600-400 MG-UNIT per tablet Take 1 tablet by mouth 2 (two) times daily.     . Cholecalciferol (VITAMIN D3) 2000 UNITS TABS Take 2,000 Units by mouth 2 (two) times daily.     . clonazePAM (KLONOPIN) 1 MG tablet Take 1 mg by mouth 3 (three) times daily as needed for anxiety.     . cloNIDine (CATAPRES) 0.2 MG tablet Take 0.2 mg by mouth 2 (two) times daily.     Marland Kitchen conjugated estrogens (PREMARIN) vaginal cream Place 1 Applicatorful vaginally 3 (three) times a week.     . cycloSPORINE (RESTASIS) 0.05 % ophthalmic emulsion Place 1 drop into both eyes 2 (two) times daily.    Marland Kitchen docusate sodium (COLACE) 100 MG capsule Take 1  capsule (100 mg total) by mouth 2 (two) times daily. 30 capsule 0  . esomeprazole (NEXIUM) 20 MG capsule Take 20 mg by mouth daily before breakfast.    . furosemide (LASIX) 20 MG tablet Take 20 mg by mouth every morning.    . lamoTRIgine (LAMICTAL) 100 MG tablet Take 200 mg by mouth 2 (two) times daily.     . Lubricants (SURGILUBE EX) Place 1 application vaginally daily before supper.     . mometasone-formoterol (DULERA) 100-5 MCG/ACT AERO Inhale 2 puffs into the lungs 2 (two) times daily. 1 Inhaler 3  . Multiple Vitamin (MULTIVITAMIN) capsule Take 1 capsule by mouth every morning.     . nitroGLYCERIN (NITROSTAT) 0.4 MG SL tablet Place 1 tablet (0.4 mg total) under the tongue every 5 (five) minutes as needed for chest pain. 90 tablet 3  . olmesartan (BENICAR) 40 MG tablet Take 40 mg by mouth every morning.    Marland Kitchen oxyCODONE-acetaminophen (PERCOCET) 10-325 MG per tablet Take 1 tablet by mouth every 4 (four) hours.     . polyethylene glycol (MIRALAX / GLYCOLAX) packet Take 17 g by mouth daily as needed for mild constipation or moderate constipation.     . potassium chloride (KLOR-CON) 8 MEQ tablet Take 8 mEq by mouth every morning.     . rosuvastatin (CRESTOR) 5 MG tablet Take 5 mg by mouth at bedtime.    . sertraline (ZOLOFT) 50 MG tablet Take 50 mg by mouth every morning.     . tiotropium (SPIRIVA) 18 MCG inhalation capsule Place 18 mcg into inhaler and inhale every evening.      No current facility-administered medications for this visit.     Allergies:   Patient has no known allergies.   Social History:  The patient  reports that she quit smoking about 5 years ago. Her smoking use included Cigarettes. She has a 100.00 pack-year smoking history. She has never used smokeless tobacco. She reports that she does not drink alcohol or use drugs.   Family History:  The patient's family history includes Hypertension in her father and mother.   ROS:  Please see the history of present illness.   All  other systems reviewed and negative.   PHYSICAL EXAM: VS:  BP 110/88   Pulse 73   Ht 5\' 6"  (1.676 m)   Wt 168 lb 9.6 oz (76.5 kg)   BMI 27.21 kg/m  Well nourished, well developed, in no acute distress  HEENT: normal  Neck: no JVD  Cardiac:  normal S1, S2; RRR; 1/6 systolic murmur at RUSB  Lungs:  clear to auscultation bilaterally, no wheezing, rhonchi or rales  Abd: soft, nontender, no hepatomegaly  Ext: no edema  Skin: warm and dry  Neuro:  CNs 2-12 intact, no focal abnormalities noted but significant memory loss and tremor noted.   Laboratory data:  05/11/15: cholesterol 163, triglycerides 53, HDL 97, LDL 56.  Dated 08/14/15 normal chemistries.   EKG:  Sinus HR 73, normal axis, Low voltage. Nonspecific TWA. I have personally reviewed and interpreted this study.   ASSESSMENT AND PLAN:  1. CAD:  Remote inferior MI. No anginal symptoms. Continue medical therapy. 2. Hypertension:  Well controlled.  3. Hyperlipidemia:  Continue statin.  Labs checked by PCP. Will request a copy. 4. Carotid Stenosis: last checked one year ago.  I think follow up every 2 years appropriate.  5. Disposition:  F/u with Dr. Alivya Wegman [place] in one year.    Signed, Tessia Kassin [place] MD, [facility]    06/17/2016 3:40 PM

## 2016-06-17 ENCOUNTER — Encounter: Payer: Self-pay | Admitting: Cardiology

## 2016-06-17 ENCOUNTER — Ambulatory Visit (INDEPENDENT_AMBULATORY_CARE_PROVIDER_SITE_OTHER): Payer: Medicare Other | Admitting: Cardiology

## 2016-06-17 VITALS — BP 110/88 | HR 73 | Ht 66.0 in | Wt 168.6 lb

## 2016-06-17 DIAGNOSIS — I10 Essential (primary) hypertension: Secondary | ICD-10-CM | POA: Diagnosis not present

## 2016-06-17 DIAGNOSIS — I6523 Occlusion and stenosis of bilateral carotid arteries: Secondary | ICD-10-CM | POA: Diagnosis not present

## 2016-06-17 DIAGNOSIS — I209 Angina pectoris, unspecified: Secondary | ICD-10-CM

## 2016-06-17 DIAGNOSIS — I25118 Atherosclerotic heart disease of native coronary artery with other forms of angina pectoris: Secondary | ICD-10-CM

## 2016-06-17 NOTE — Addendum Note (Signed)
Addended by: Kathyrn Lass on: 06/17/2016 03:48 PM   Modules accepted: Orders

## 2016-06-17 NOTE — Patient Instructions (Signed)
Continue your current therapy  I will see you in one year   

## 2016-06-21 DIAGNOSIS — J441 Chronic obstructive pulmonary disease with (acute) exacerbation: Secondary | ICD-10-CM | POA: Diagnosis not present

## 2016-06-23 DIAGNOSIS — J441 Chronic obstructive pulmonary disease with (acute) exacerbation: Secondary | ICD-10-CM | POA: Diagnosis not present

## 2016-06-27 ENCOUNTER — Other Ambulatory Visit: Payer: Self-pay | Admitting: Family Medicine

## 2016-06-27 ENCOUNTER — Ambulatory Visit
Admission: RE | Admit: 2016-06-27 | Discharge: 2016-06-27 | Disposition: A | Discharge status: Home / Self Care | Payer: Medicare Other | Source: Ambulatory Visit | Attending: Family Medicine | Admitting: Family Medicine

## 2016-06-27 DIAGNOSIS — J441 Chronic obstructive pulmonary disease with (acute) exacerbation: Secondary | ICD-10-CM | POA: Diagnosis not present

## 2016-06-27 DIAGNOSIS — R05 Cough: Secondary | ICD-10-CM | POA: Diagnosis not present

## 2016-06-27 DIAGNOSIS — J0110 Acute frontal sinusitis, unspecified: Secondary | ICD-10-CM | POA: Diagnosis not present

## 2016-06-27 DIAGNOSIS — R059 Cough, unspecified: Secondary | ICD-10-CM

## 2016-06-27 DIAGNOSIS — R0600 Dyspnea, unspecified: Secondary | ICD-10-CM

## 2016-06-29 DIAGNOSIS — J181 Lobar pneumonia, unspecified organism: Secondary | ICD-10-CM | POA: Diagnosis not present

## 2016-07-15 DIAGNOSIS — N898 Other specified noninflammatory disorders of vagina: Secondary | ICD-10-CM | POA: Diagnosis not present

## 2016-07-15 DIAGNOSIS — R05 Cough: Secondary | ICD-10-CM | POA: Diagnosis not present

## 2016-08-04 ENCOUNTER — Other Ambulatory Visit: Payer: Self-pay | Admitting: Family Medicine

## 2016-08-04 ENCOUNTER — Ambulatory Visit
Admission: RE | Admit: 2016-08-04 | Discharge: 2016-08-04 | Disposition: A | Discharge status: Home / Self Care | Payer: Medicare Other | Source: Ambulatory Visit | Attending: Family Medicine | Admitting: Family Medicine

## 2016-08-04 DIAGNOSIS — J189 Pneumonia, unspecified organism: Secondary | ICD-10-CM | POA: Diagnosis not present

## 2016-08-04 DIAGNOSIS — J181 Lobar pneumonia, unspecified organism: Secondary | ICD-10-CM

## 2016-08-22 DIAGNOSIS — G894 Chronic pain syndrome: Secondary | ICD-10-CM | POA: Diagnosis not present

## 2016-08-22 DIAGNOSIS — Z79891 Long term (current) use of opiate analgesic: Secondary | ICD-10-CM | POA: Diagnosis not present

## 2016-08-22 DIAGNOSIS — M5136 Other intervertebral disc degeneration, lumbar region: Secondary | ICD-10-CM | POA: Diagnosis not present

## 2016-08-24 DIAGNOSIS — Z85528 Personal history of other malignant neoplasm of kidney: Secondary | ICD-10-CM | POA: Diagnosis not present

## 2016-08-24 DIAGNOSIS — N3942 Incontinence without sensory awareness: Secondary | ICD-10-CM | POA: Diagnosis not present

## 2016-08-29 ENCOUNTER — Other Ambulatory Visit: Payer: Self-pay | Admitting: Family Medicine

## 2016-08-29 DIAGNOSIS — Z1231 Encounter for screening mammogram for malignant neoplasm of breast: Secondary | ICD-10-CM

## 2016-09-02 ENCOUNTER — Ambulatory Visit
Admission: RE | Admit: 2016-09-02 | Discharge: 2016-09-02 | Disposition: A | Discharge status: Home / Self Care | Payer: Medicare Other | Source: Ambulatory Visit | Attending: Family Medicine | Admitting: Family Medicine

## 2016-09-02 DIAGNOSIS — Z1231 Encounter for screening mammogram for malignant neoplasm of breast: Secondary | ICD-10-CM

## 2016-09-06 ENCOUNTER — Other Ambulatory Visit: Payer: Self-pay | Admitting: Family Medicine

## 2016-09-06 DIAGNOSIS — R928 Other abnormal and inconclusive findings on diagnostic imaging of breast: Secondary | ICD-10-CM

## 2016-09-08 DIAGNOSIS — I10 Essential (primary) hypertension: Secondary | ICD-10-CM | POA: Diagnosis not present

## 2016-09-08 DIAGNOSIS — Z23 Encounter for immunization: Secondary | ICD-10-CM | POA: Diagnosis not present

## 2016-09-08 DIAGNOSIS — E785 Hyperlipidemia, unspecified: Secondary | ICD-10-CM | POA: Diagnosis not present

## 2016-09-08 DIAGNOSIS — F319 Bipolar disorder, unspecified: Secondary | ICD-10-CM | POA: Diagnosis not present

## 2016-09-09 ENCOUNTER — Ambulatory Visit
Admission: RE | Admit: 2016-09-09 | Discharge: 2016-09-09 | Disposition: A | Discharge status: Home / Self Care | Payer: Medicare Other | Source: Ambulatory Visit | Attending: Family Medicine | Admitting: Family Medicine

## 2016-09-09 ENCOUNTER — Ambulatory Visit: Payer: Medicare Other

## 2016-09-09 DIAGNOSIS — R928 Other abnormal and inconclusive findings on diagnostic imaging of breast: Secondary | ICD-10-CM

## 2016-09-27 DIAGNOSIS — G513 Clonic hemifacial spasm: Secondary | ICD-10-CM | POA: Diagnosis not present

## 2016-10-24 DIAGNOSIS — M62838 Other muscle spasm: Secondary | ICD-10-CM | POA: Diagnosis not present

## 2016-10-24 DIAGNOSIS — S161XXA Strain of muscle, fascia and tendon at neck level, initial encounter: Secondary | ICD-10-CM | POA: Diagnosis not present

## 2016-11-03 DIAGNOSIS — M542 Cervicalgia: Secondary | ICD-10-CM | POA: Diagnosis not present

## 2016-11-07 ENCOUNTER — Other Ambulatory Visit: Payer: Self-pay | Admitting: *Deleted

## 2016-11-07 DIAGNOSIS — I6523 Occlusion and stenosis of bilateral carotid arteries: Secondary | ICD-10-CM

## 2016-11-08 DIAGNOSIS — M542 Cervicalgia: Secondary | ICD-10-CM | POA: Diagnosis not present

## 2016-11-11 DIAGNOSIS — M542 Cervicalgia: Secondary | ICD-10-CM | POA: Diagnosis not present

## 2016-11-14 DIAGNOSIS — Z23 Encounter for immunization: Secondary | ICD-10-CM | POA: Diagnosis not present

## 2016-11-14 DIAGNOSIS — E785 Hyperlipidemia, unspecified: Secondary | ICD-10-CM | POA: Diagnosis not present

## 2016-11-14 DIAGNOSIS — F319 Bipolar disorder, unspecified: Secondary | ICD-10-CM | POA: Diagnosis not present

## 2016-11-14 DIAGNOSIS — M5136 Other intervertebral disc degeneration, lumbar region: Secondary | ICD-10-CM | POA: Diagnosis not present

## 2016-11-14 DIAGNOSIS — I10 Essential (primary) hypertension: Secondary | ICD-10-CM | POA: Diagnosis not present

## 2016-11-14 DIAGNOSIS — Z6832 Body mass index (BMI) 32.0-32.9, adult: Secondary | ICD-10-CM | POA: Diagnosis not present

## 2016-11-15 DIAGNOSIS — M542 Cervicalgia: Secondary | ICD-10-CM | POA: Diagnosis not present

## 2016-11-17 DIAGNOSIS — M542 Cervicalgia: Secondary | ICD-10-CM | POA: Diagnosis not present

## 2016-11-22 DIAGNOSIS — M542 Cervicalgia: Secondary | ICD-10-CM | POA: Diagnosis not present

## 2016-11-24 DIAGNOSIS — M542 Cervicalgia: Secondary | ICD-10-CM | POA: Diagnosis not present

## 2016-11-29 DIAGNOSIS — M542 Cervicalgia: Secondary | ICD-10-CM | POA: Diagnosis not present

## 2016-12-01 DIAGNOSIS — M542 Cervicalgia: Secondary | ICD-10-CM | POA: Diagnosis not present

## 2016-12-02 ENCOUNTER — Encounter: Payer: Self-pay | Admitting: Radiation Oncology

## 2016-12-02 ENCOUNTER — Ambulatory Visit
Admission: RE | Admit: 2016-12-02 | Discharge: 2016-12-02 | Disposition: A | Discharge status: Home / Self Care | Payer: Medicare Other | Source: Ambulatory Visit | Attending: Radiation Oncology | Admitting: Radiation Oncology

## 2016-12-02 DIAGNOSIS — Z923 Personal history of irradiation: Secondary | ICD-10-CM | POA: Diagnosis not present

## 2016-12-02 DIAGNOSIS — C52 Malignant neoplasm of vagina: Secondary | ICD-10-CM

## 2016-12-02 DIAGNOSIS — Z7982 Long term (current) use of aspirin: Secondary | ICD-10-CM | POA: Insufficient documentation

## 2016-12-02 DIAGNOSIS — Z08 Encounter for follow-up examination after completed treatment for malignant neoplasm: Secondary | ICD-10-CM | POA: Diagnosis not present

## 2016-12-02 DIAGNOSIS — Z79899 Other long term (current) drug therapy: Secondary | ICD-10-CM | POA: Diagnosis not present

## 2016-12-02 DIAGNOSIS — Z8544 Personal history of malignant neoplasm of other female genital organs: Secondary | ICD-10-CM | POA: Diagnosis not present

## 2016-12-02 NOTE — Progress Notes (Signed)
Ms. Fanton presents for follow up of radiation to her vaginal cuff area completed 02/21/12. She reports chronic back pain which she is taking oxycodone four times daily. She denies vaginal pain or discharge. She is not using the vaginal dilator, because she was told she did not have to use it anymore. She reports constipation which is relieved by miralax. She denies bladder problems. She saw Dr. Alycia Rossetti 06/01/16 and had a PAP smear and vaginal exam performed. She denies any other concerns at this time.   BP (!) 145/94   Pulse (!) 58   Temp 97.8 F (36.6 C)   Ht 5\' 6"  (1.676 m)   Wt 173 lb 9.6 oz (78.7 kg)   SpO2 95% Comment: room air  BMI 28.02 kg/m    Wt Readings from Last 3 Encounters:  12/02/16 173 lb 9.6 oz (78.7 kg)  06/17/16 168 lb 9.6 oz (76.5 kg)  06/01/16 170 lb 11.2 oz (77.4 kg)

## 2016-12-02 NOTE — Progress Notes (Signed)
Radiation Oncology         [phone] ________________________________  Name: [name] MRN: [id number]  Date: 12/02/2016  DOB: [birth year]  Follow-Up Visit Note  Outpatient    ICD-10-CM   1. Cancer of vaginal vault ([place]) C52      CC: Harlan Stains, MD  Nancy Marus, MD   Chief Complaint: here for followup of vaginal cancer  Diagnosis and Prior Radiotherapy:   Squamous cell carcinoma at the vaginal cuff - recurrent cervical cancer presenting as stage I vaginal cancer  She is status post 38.75 Gray in 5 fractions, high dose rate vaginal cuff brachytherapy, 5.5 cm length of proximal vagina treated. She completed treatment on 02/21/2012  Narrative:  The patient returns today for routine follow-up of radiation completed 02/21/12 to her vaginal area. The patient had a pap smear done 06/01/2016, that resulted as negative. She has stopped using the dilator and premarin cream. She reports that her neck has been tight and she has been attending physical therapy with relief. She denies any other symptoms or complaints at this time.    ALLERGIES:  has No Known Allergies.  Meds: Current Outpatient Medications  Medication Sig Dispense Refill  . albuterol (PROVENTIL HFA;VENTOLIN HFA) 108 (90 BASE) MCG/ACT inhaler Inhale 2 puffs into the lungs every 6 (six) hours as needed for wheezing.    Marland Kitchen amLODipine (NORVASC) 2.5 MG tablet Take 1 tablet (2.5 mg total) by mouth daily. (Patient taking differently: Take 5 mg by mouth daily. ) 30 tablet 11  . ARIPiprazole (ABILIFY) 20 MG tablet Take 20 mg by mouth every morning.    Marland Kitchen aspirin EC 81 MG tablet Take 81 mg by mouth every morning.     . Calcium Carbonate-Vitamin D 600-400 MG-UNIT per tablet Take 1 tablet by mouth 2 (two) times daily.     . Cholecalciferol (VITAMIN D3) 2000 UNITS TABS Take 2,000 Units by mouth 2 (two) times daily.     . clonazePAM (KLONOPIN) 1 MG tablet Take 1 mg 3 (three) times daily as needed by mouth for anxiety (she is  currently taking .5 mg once daily. She is weaning herself with her doctors knowledge.).     Marland Kitchen cloNIDine (CATAPRES) 0.2 MG tablet Take 0.2 mg by mouth 2 (two) times daily.     . cycloSPORINE (RESTASIS) 0.05 % ophthalmic emulsion Place 1 drop into both eyes 2 (two) times daily.    Marland Kitchen docusate sodium (COLACE) 100 MG capsule Take 1 capsule (100 mg total) by mouth 2 (two) times daily. 30 capsule 0  . esomeprazole (NEXIUM) 20 MG capsule Take 20 mg by mouth daily before breakfast.    . furosemide (LASIX) 20 MG tablet Take 20 mg by mouth every morning.    . lamoTRIgine (LAMICTAL) 100 MG tablet Take 200 mg by mouth 2 (two) times daily.     . mometasone-formoterol (DULERA) 100-5 MCG/ACT AERO Inhale 2 puffs into the lungs 2 (two) times daily. 1 Inhaler 3  . Multiple Vitamin (MULTIVITAMIN) capsule Take 1 capsule by mouth every morning.     Marland Kitchen oxyCODONE-acetaminophen (PERCOCET) 10-325 MG per tablet Take 1 tablet by mouth every 4 (four) hours.     . polyethylene glycol (MIRALAX / GLYCOLAX) packet Take 17 g by mouth daily as needed for mild constipation or moderate constipation.     . potassium chloride (KLOR-CON) 8 MEQ tablet Take 8 mEq by mouth every morning.     . sertraline (ZOLOFT) 50 MG tablet Take 50 mg by mouth  every morning.     . tiotropium (SPIRIVA) 18 MCG inhalation capsule Place 18 mcg into inhaler and inhale every evening.     . conjugated estrogens (PREMARIN) vaginal cream Place 1 Applicatorful vaginally 3 (three) times a week.     . Lubricants (SURGILUBE EX) Place 1 application vaginally daily before supper.     . nitroGLYCERIN (NITROSTAT) 0.4 MG SL tablet Place 1 tablet (0.4 mg total) under the tongue every 5 (five) minutes as needed for chest pain. 90 tablet 3  . olmesartan (BENICAR) 40 MG tablet Take 40 mg by mouth every morning.    . rosuvastatin (CRESTOR) 5 MG tablet Take 5 mg by mouth at bedtime.     No current facility-administered medications for this encounter.     Physical  Findings: Gen" The patient is in no acute distress. Patient is alert and oriented.  height is 5\' 6"  (1.676 m) and weight is 173 lb 9.6 oz (78.7 kg). Her temperature is 97.8 F (36.6 C). Her blood pressure is 145/94 (abnormal) and her pulse is 58 (abnormal). Her oxygen saturation is 95%. Marland Kitchen    HEENT: Oral cavity and oropharynx are clear. NECK: No palpable masses in the cervical or supraclavicular neck.  EXT: No edema noted in the extremities. Heart: regular in rate and rhythm. Murmur at aortic region.  Chest: is clear. CTAB Abdomen: is soft and non distended; no tenderness to palpation. LYMPH: Groin had no palpable masses bilaterally. GYN: External genitalia is without any lesions. No visible lesions in the vagina.  RECTAL: No recto or vaginal fistulas PSYCH: blunted affect, alert and conversant, pleasant to speak with  Lab Findings: Lab Results  Component Value Date   WBC 5.6 07/02/2012   HGB 9.7 (L) 07/12/2012   HCT 28.7 (L) 07/12/2012   MCV 87.3 07/02/2012   PLT 323 07/02/2012    Radiographic Findings: No new imaging.  No results found.  Impression/Plan: NED. She is about 5 years from diagnosis - she graduates from our clinic today... I will see her back PRN. She will see GYN [facility] for her next appt, and her future pap smears.    Eppie Gibson, MD   This document serves as a record of services personally performed by Eppie Gibson, MD. It was created on her behalf by Margit Banda, a trained medical scribe. The creation of this record is based on the scribe's personal observations and the provider's statements to them. This document has been checked and approved by the attending provider.

## 2016-12-05 ENCOUNTER — Telehealth: Payer: Self-pay | Admitting: *Deleted

## 2016-12-05 DIAGNOSIS — Z79891 Long term (current) use of opiate analgesic: Secondary | ICD-10-CM | POA: Diagnosis not present

## 2016-12-05 DIAGNOSIS — G894 Chronic pain syndrome: Secondary | ICD-10-CM | POA: Diagnosis not present

## 2016-12-05 DIAGNOSIS — M5136 Other intervertebral disc degeneration, lumbar region: Secondary | ICD-10-CM | POA: Diagnosis not present

## 2016-12-05 NOTE — Telephone Encounter (Signed)
Returned the patient's call and advised her to call back in January for an appt in May with Dr. Alycia Rossetti.

## 2016-12-13 DIAGNOSIS — M542 Cervicalgia: Secondary | ICD-10-CM | POA: Diagnosis not present

## 2016-12-20 DIAGNOSIS — M542 Cervicalgia: Secondary | ICD-10-CM | POA: Diagnosis not present

## 2016-12-29 DIAGNOSIS — G5131 Clonic hemifacial spasm, right: Secondary | ICD-10-CM | POA: Diagnosis not present

## 2017-01-03 DIAGNOSIS — M542 Cervicalgia: Secondary | ICD-10-CM | POA: Diagnosis not present

## 2017-01-06 DIAGNOSIS — H01005 Unspecified blepharitis left lower eyelid: Secondary | ICD-10-CM | POA: Diagnosis not present

## 2017-01-06 DIAGNOSIS — H04123 Dry eye syndrome of bilateral lacrimal glands: Secondary | ICD-10-CM | POA: Diagnosis not present

## 2017-01-06 DIAGNOSIS — H01002 Unspecified blepharitis right lower eyelid: Secondary | ICD-10-CM | POA: Diagnosis not present

## 2017-01-06 DIAGNOSIS — H02204 Unspecified lagophthalmos left upper eyelid: Secondary | ICD-10-CM | POA: Diagnosis not present

## 2017-01-06 DIAGNOSIS — H01001 Unspecified blepharitis right upper eyelid: Secondary | ICD-10-CM | POA: Diagnosis not present

## 2017-01-06 DIAGNOSIS — H532 Diplopia: Secondary | ICD-10-CM | POA: Diagnosis not present

## 2017-01-06 DIAGNOSIS — H01004 Unspecified blepharitis left upper eyelid: Secondary | ICD-10-CM | POA: Diagnosis not present

## 2017-01-06 DIAGNOSIS — H02205 Unspecified lagophthalmos left lower eyelid: Secondary | ICD-10-CM | POA: Diagnosis not present

## 2017-01-06 DIAGNOSIS — H16212 Exposure keratoconjunctivitis, left eye: Secondary | ICD-10-CM | POA: Diagnosis not present

## 2017-01-17 HISTORY — PX: BREAST EXCISIONAL BIOPSY: SUR124

## 2017-01-20 DIAGNOSIS — H04123 Dry eye syndrome of bilateral lacrimal glands: Secondary | ICD-10-CM | POA: Diagnosis not present

## 2017-01-20 DIAGNOSIS — H01004 Unspecified blepharitis left upper eyelid: Secondary | ICD-10-CM | POA: Diagnosis not present

## 2017-01-20 DIAGNOSIS — H01001 Unspecified blepharitis right upper eyelid: Secondary | ICD-10-CM | POA: Diagnosis not present

## 2017-01-20 DIAGNOSIS — H01002 Unspecified blepharitis right lower eyelid: Secondary | ICD-10-CM | POA: Diagnosis not present

## 2017-02-14 ENCOUNTER — Other Ambulatory Visit: Payer: Self-pay | Admitting: Family Medicine

## 2017-02-14 DIAGNOSIS — I739 Peripheral vascular disease, unspecified: Secondary | ICD-10-CM | POA: Diagnosis not present

## 2017-02-14 DIAGNOSIS — I10 Essential (primary) hypertension: Secondary | ICD-10-CM | POA: Diagnosis not present

## 2017-02-14 DIAGNOSIS — Z6831 Body mass index (BMI) 31.0-31.9, adult: Secondary | ICD-10-CM | POA: Diagnosis not present

## 2017-02-14 DIAGNOSIS — E785 Hyperlipidemia, unspecified: Secondary | ICD-10-CM | POA: Diagnosis not present

## 2017-02-14 DIAGNOSIS — F319 Bipolar disorder, unspecified: Secondary | ICD-10-CM | POA: Diagnosis not present

## 2017-02-15 ENCOUNTER — Other Ambulatory Visit: Payer: Self-pay | Admitting: Family Medicine

## 2017-02-15 DIAGNOSIS — I739 Peripheral vascular disease, unspecified: Secondary | ICD-10-CM

## 2017-02-22 ENCOUNTER — Ambulatory Visit
Admission: RE | Admit: 2017-02-22 | Discharge: 2017-02-22 | Disposition: A | Discharge status: Home / Self Care | Payer: Medicare Other | Source: Ambulatory Visit | Attending: Family Medicine | Admitting: Family Medicine

## 2017-02-22 DIAGNOSIS — I739 Peripheral vascular disease, unspecified: Secondary | ICD-10-CM

## 2017-02-22 DIAGNOSIS — I70213 Atherosclerosis of native arteries of extremities with intermittent claudication, bilateral legs: Secondary | ICD-10-CM | POA: Diagnosis not present

## 2017-02-24 ENCOUNTER — Encounter (HOSPITAL_COMMUNITY): Payer: Self-pay

## 2017-02-24 ENCOUNTER — Emergency Department (HOSPITAL_COMMUNITY): Payer: Medicare Other

## 2017-02-24 ENCOUNTER — Other Ambulatory Visit: Payer: Self-pay

## 2017-02-24 ENCOUNTER — Inpatient Hospital Stay (HOSPITAL_COMMUNITY)
Admission: EM | Admit: 2017-02-24 | Discharge: 2017-02-26 | DRG: 292 | Disposition: A | Discharge status: Home / Self Care | Payer: Medicare Other | Attending: Internal Medicine | Admitting: Internal Medicine

## 2017-02-24 DIAGNOSIS — I5030 Unspecified diastolic (congestive) heart failure: Secondary | ICD-10-CM

## 2017-02-24 DIAGNOSIS — I252 Old myocardial infarction: Secondary | ICD-10-CM | POA: Diagnosis not present

## 2017-02-24 DIAGNOSIS — M4854XA Collapsed vertebra, not elsewhere classified, thoracic region, initial encounter for fracture: Secondary | ICD-10-CM | POA: Diagnosis present

## 2017-02-24 DIAGNOSIS — I739 Peripheral vascular disease, unspecified: Secondary | ICD-10-CM | POA: Diagnosis present

## 2017-02-24 DIAGNOSIS — I5033 Acute on chronic diastolic (congestive) heart failure: Secondary | ICD-10-CM | POA: Diagnosis present

## 2017-02-24 DIAGNOSIS — Z8541 Personal history of malignant neoplasm of cervix uteri: Secondary | ICD-10-CM

## 2017-02-24 DIAGNOSIS — I160 Hypertensive urgency: Secondary | ICD-10-CM | POA: Diagnosis present

## 2017-02-24 DIAGNOSIS — M40204 Unspecified kyphosis, thoracic region: Secondary | ICD-10-CM | POA: Diagnosis present

## 2017-02-24 DIAGNOSIS — I2510 Atherosclerotic heart disease of native coronary artery without angina pectoris: Secondary | ICD-10-CM | POA: Diagnosis present

## 2017-02-24 DIAGNOSIS — F319 Bipolar disorder, unspecified: Secondary | ICD-10-CM | POA: Diagnosis not present

## 2017-02-24 DIAGNOSIS — R0789 Other chest pain: Secondary | ICD-10-CM | POA: Diagnosis not present

## 2017-02-24 DIAGNOSIS — F329 Major depressive disorder, single episode, unspecified: Secondary | ICD-10-CM | POA: Diagnosis not present

## 2017-02-24 DIAGNOSIS — Z8249 Family history of ischemic heart disease and other diseases of the circulatory system: Secondary | ICD-10-CM

## 2017-02-24 DIAGNOSIS — Z66 Do not resuscitate: Secondary | ICD-10-CM | POA: Diagnosis present

## 2017-02-24 DIAGNOSIS — Z96659 Presence of unspecified artificial knee joint: Secondary | ICD-10-CM | POA: Diagnosis present

## 2017-02-24 DIAGNOSIS — F418 Other specified anxiety disorders: Secondary | ICD-10-CM | POA: Diagnosis present

## 2017-02-24 DIAGNOSIS — F32A Depression, unspecified: Secondary | ICD-10-CM | POA: Diagnosis present

## 2017-02-24 DIAGNOSIS — I25118 Atherosclerotic heart disease of native coronary artery with other forms of angina pectoris: Secondary | ICD-10-CM | POA: Diagnosis not present

## 2017-02-24 DIAGNOSIS — R0602 Shortness of breath: Secondary | ICD-10-CM | POA: Diagnosis not present

## 2017-02-24 DIAGNOSIS — I509 Heart failure, unspecified: Secondary | ICD-10-CM | POA: Diagnosis not present

## 2017-02-24 DIAGNOSIS — Z87891 Personal history of nicotine dependence: Secondary | ICD-10-CM

## 2017-02-24 DIAGNOSIS — Z955 Presence of coronary angioplasty implant and graft: Secondary | ICD-10-CM | POA: Diagnosis not present

## 2017-02-24 DIAGNOSIS — Z961 Presence of intraocular lens: Secondary | ICD-10-CM | POA: Diagnosis present

## 2017-02-24 DIAGNOSIS — Z96641 Presence of right artificial hip joint: Secondary | ICD-10-CM | POA: Diagnosis present

## 2017-02-24 DIAGNOSIS — Z9842 Cataract extraction status, left eye: Secondary | ICD-10-CM | POA: Diagnosis not present

## 2017-02-24 DIAGNOSIS — I110 Hypertensive heart disease with heart failure: Principal | ICD-10-CM | POA: Diagnosis present

## 2017-02-24 DIAGNOSIS — J441 Chronic obstructive pulmonary disease with (acute) exacerbation: Secondary | ICD-10-CM | POA: Diagnosis present

## 2017-02-24 DIAGNOSIS — R079 Chest pain, unspecified: Secondary | ICD-10-CM | POA: Diagnosis not present

## 2017-02-24 DIAGNOSIS — Z79899 Other long term (current) drug therapy: Secondary | ICD-10-CM

## 2017-02-24 DIAGNOSIS — J449 Chronic obstructive pulmonary disease, unspecified: Secondary | ICD-10-CM | POA: Diagnosis present

## 2017-02-24 DIAGNOSIS — J439 Emphysema, unspecified: Secondary | ICD-10-CM | POA: Diagnosis present

## 2017-02-24 DIAGNOSIS — I10 Essential (primary) hypertension: Secondary | ICD-10-CM | POA: Diagnosis present

## 2017-02-24 DIAGNOSIS — G8929 Other chronic pain: Secondary | ICD-10-CM | POA: Diagnosis present

## 2017-02-24 DIAGNOSIS — I700 Atherosclerosis of aorta: Secondary | ICD-10-CM | POA: Diagnosis not present

## 2017-02-24 DIAGNOSIS — R0600 Dyspnea, unspecified: Secondary | ICD-10-CM | POA: Diagnosis not present

## 2017-02-24 DIAGNOSIS — Z923 Personal history of irradiation: Secondary | ICD-10-CM

## 2017-02-24 DIAGNOSIS — Z9841 Cataract extraction status, right eye: Secondary | ICD-10-CM | POA: Diagnosis not present

## 2017-02-24 DIAGNOSIS — Z8544 Personal history of malignant neoplasm of other female genital organs: Secondary | ICD-10-CM

## 2017-02-24 HISTORY — DX: Unspecified diastolic (congestive) heart failure: I50.30

## 2017-02-24 HISTORY — DX: Heart failure, unspecified: I50.9

## 2017-02-24 LAB — CBC WITH DIFFERENTIAL/PLATELET
Basophils Absolute: 0 10*3/uL (ref 0.0–0.1)
Basophils Relative: 1 %
Eosinophils Absolute: 0.3 10*3/uL (ref 0.0–0.7)
Eosinophils Relative: 5 %
HEMATOCRIT: 39.3 % (ref 36.0–46.0)
Hemoglobin: 14.1 g/dL (ref 12.0–15.0)
Lymphocytes Relative: 26 %
Lymphs Abs: 1.5 10*3/uL (ref 0.7–4.0)
MCH: 32.6 pg (ref 26.0–34.0)
MCHC: 35.9 g/dL (ref 30.0–36.0)
MCV: 91 fL (ref 78.0–100.0)
MONO ABS: 0.5 10*3/uL (ref 0.1–1.0)
MONOS PCT: 9 %
NEUTROS ABS: 3.4 10*3/uL (ref 1.7–7.7)
Neutrophils Relative %: 59 %
Platelets: 257 10*3/uL (ref 150–400)
RBC: 4.32 MIL/uL (ref 3.87–5.11)
RDW: 12.7 % (ref 11.5–15.5)
WBC: 5.7 10*3/uL (ref 4.0–10.5)

## 2017-02-24 LAB — BASIC METABOLIC PANEL
Anion gap: 15 (ref 5–15)
BUN: 8 mg/dL (ref 6–20)
CALCIUM: 9.8 mg/dL (ref 8.9–10.3)
CO2: 21 mmol/L — AB (ref 22–32)
CREATININE: 0.83 mg/dL (ref 0.44–1.00)
Chloride: 98 mmol/L — ABNORMAL LOW (ref 101–111)
GFR calc non Af Amer: >60 mL/min (ref >60)
Glucose, Bld: 93 mg/dL (ref 65–99)
Potassium: 3.9 mmol/L (ref 3.5–5.1)
Sodium: 134 mmol/L — ABNORMAL LOW (ref 135–145)

## 2017-02-24 LAB — D-DIMER, QUANTITATIVE: D-Dimer, Quant: 0.98 ug/mL-FEU — ABNORMAL HIGH (ref 0.00–0.50)

## 2017-02-24 LAB — BRAIN NATRIURETIC PEPTIDE: B Natriuretic Peptide: 274.9 pg/mL — ABNORMAL HIGH (ref 0.0–100.0)

## 2017-02-24 LAB — TROPONIN I

## 2017-02-24 MED ORDER — DOCUSATE SODIUM 100 MG PO CAPS
100.0000 mg | ORAL_CAPSULE | Freq: Every day | ORAL | Status: DC
  Administered 2017-02-25 – 2017-02-26 (×2): 100 mg via ORAL
  Filled 2017-02-24 (×2): qty 1

## 2017-02-24 MED ORDER — POLYETHYLENE GLYCOL 3350 17 G PO PACK: 17.0000 g | PACK | Freq: Every day | ORAL | Status: DC | PRN

## 2017-02-24 MED ORDER — ALBUTEROL SULFATE HFA 108 (90 BASE) MCG/ACT IN AERS: 2.0000 | INHALATION_SPRAY | Freq: Four times a day (QID) | RESPIRATORY_TRACT | Status: DC | PRN

## 2017-02-24 MED ORDER — OXYCODONE-ACETAMINOPHEN 5-325 MG PO TABS
1.0000 | ORAL_TABLET | Freq: Four times a day (QID) | ORAL | Status: DC | PRN
  Administered 2017-02-25 – 2017-02-26 (×3): 1 via ORAL
  Filled 2017-02-24 (×3): qty 1

## 2017-02-24 MED ORDER — VITAMIN D 1000 UNITS PO TABS
2000.0000 [IU] | ORAL_TABLET | Freq: Two times a day (BID) | ORAL | Status: DC
  Administered 2017-02-25 – 2017-02-26 (×4): 2000 [IU] via ORAL
  Filled 2017-02-24 (×4): qty 2

## 2017-02-24 MED ORDER — PANTOPRAZOLE SODIUM 40 MG PO TBEC
40.0000 mg | DELAYED_RELEASE_TABLET | Freq: Every day | ORAL | Status: DC
  Administered 2017-02-25 – 2017-02-26 (×2): 40 mg via ORAL
  Filled 2017-02-24 (×2): qty 1

## 2017-02-24 MED ORDER — CYCLOSPORINE 0.05 % OP EMUL
1.0000 [drp] | Freq: Two times a day (BID) | OPHTHALMIC | Status: DC
  Filled 2017-02-24 (×3): qty 1

## 2017-02-24 MED ORDER — AMLODIPINE BESYLATE 5 MG PO TABS
5.0000 mg | ORAL_TABLET | Freq: Every day | ORAL | Status: DC
  Administered 2017-02-25 – 2017-02-26 (×2): 5 mg via ORAL
  Filled 2017-02-24 (×2): qty 1

## 2017-02-24 MED ORDER — ARIPIPRAZOLE 5 MG PO TABS
20.0000 mg | ORAL_TABLET | ORAL | Status: DC
  Administered 2017-02-25 – 2017-02-26 (×2): 20 mg via ORAL
  Filled 2017-02-24: qty 4
  Filled 2017-02-24: qty 2

## 2017-02-24 MED ORDER — CLONIDINE HCL 0.2 MG PO TABS
0.2000 mg | ORAL_TABLET | Freq: Two times a day (BID) | ORAL | Status: DC
  Administered 2017-02-25 – 2017-02-26 (×4): 0.2 mg via ORAL
  Filled 2017-02-24 (×4): qty 1

## 2017-02-24 MED ORDER — FUROSEMIDE 10 MG/ML IJ SOLN
20.0000 mg | Freq: Once | INTRAMUSCULAR | Status: AC
  Administered 2017-02-24: 20 mg via INTRAVENOUS
  Filled 2017-02-24: qty 2

## 2017-02-24 MED ORDER — HEPARIN SODIUM (PORCINE) 5000 UNIT/ML IJ SOLN
5000.0000 [IU] | Freq: Three times a day (TID) | INTRAMUSCULAR | Status: DC
  Administered 2017-02-25 – 2017-02-26 (×6): 5000 [IU] via SUBCUTANEOUS
  Filled 2017-02-24 (×6): qty 1

## 2017-02-24 MED ORDER — OXYCODONE-ACETAMINOPHEN 10-325 MG PO TABS: 1.0000 | ORAL_TABLET | Freq: Four times a day (QID) | ORAL | Status: DC | PRN

## 2017-02-24 MED ORDER — LAMOTRIGINE 100 MG PO TABS
200.0000 mg | ORAL_TABLET | Freq: Two times a day (BID) | ORAL | Status: DC
  Administered 2017-02-25 – 2017-02-26 (×4): 200 mg via ORAL
  Filled 2017-02-24 (×4): qty 2
  Filled 2017-02-24: qty 1

## 2017-02-24 MED ORDER — OXYCODONE HCL 5 MG PO TABS
5.0000 mg | ORAL_TABLET | Freq: Once | ORAL | Status: AC
  Administered 2017-02-24: 5 mg via ORAL
  Filled 2017-02-24: qty 1

## 2017-02-24 MED ORDER — NITROGLYCERIN 0.4 MG SL SUBL: 0.4000 mg | SUBLINGUAL_TABLET | SUBLINGUAL | Status: DC | PRN

## 2017-02-24 MED ORDER — IOPAMIDOL (ISOVUE-370) INJECTION 76%
INTRAVENOUS | Status: AC
  Administered 2017-02-24: 100 mL
  Filled 2017-02-24: qty 100

## 2017-02-24 MED ORDER — OXYCODONE HCL 5 MG PO TABS
5.0000 mg | ORAL_TABLET | Freq: Four times a day (QID) | ORAL | Status: DC | PRN
  Administered 2017-02-25 – 2017-02-26 (×4): 5 mg via ORAL
  Filled 2017-02-24 (×4): qty 1

## 2017-02-24 MED ORDER — CALCIUM CARBONATE-VITAMIN D 500-200 MG-UNIT PO TABS
1.0000 | ORAL_TABLET | Freq: Two times a day (BID) | ORAL | Status: DC
  Administered 2017-02-25 – 2017-02-26 (×2): 1 via ORAL
  Filled 2017-02-24 (×3): qty 1

## 2017-02-24 MED ORDER — IRBESARTAN 300 MG PO TABS
300.0000 mg | ORAL_TABLET | Freq: Every day | ORAL | Status: DC
  Administered 2017-02-26: 300 mg via ORAL
  Filled 2017-02-24: qty 1

## 2017-02-24 MED ORDER — FUROSEMIDE 10 MG/ML IJ SOLN
20.0000 mg | Freq: Every day | INTRAMUSCULAR | Status: DC
Start: 2017-02-25 — End: 2017-02-26
  Administered 2017-02-25 – 2017-02-26 (×2): 20 mg via INTRAVENOUS
  Filled 2017-02-24: qty 4
  Filled 2017-02-24: qty 2

## 2017-02-24 MED ORDER — NITROGLYCERIN 0.4 MG SL SUBL
0.4000 mg | SUBLINGUAL_TABLET | SUBLINGUAL | Status: DC | PRN
  Administered 2017-02-24: 0.4 mg via SUBLINGUAL
  Filled 2017-02-24: qty 1

## 2017-02-24 MED ORDER — ASPIRIN EC 81 MG PO TBEC
81.0000 mg | DELAYED_RELEASE_TABLET | Freq: Every morning | ORAL | Status: DC
  Administered 2017-02-25 – 2017-02-26 (×2): 81 mg via ORAL
  Filled 2017-02-24 (×2): qty 1

## 2017-02-24 MED ORDER — SERTRALINE HCL 50 MG PO TABS
50.0000 mg | ORAL_TABLET | Freq: Every morning | ORAL | Status: DC
  Administered 2017-02-25 – 2017-02-26 (×2): 50 mg via ORAL
  Filled 2017-02-24 (×2): qty 1

## 2017-02-24 MED ORDER — MOMETASONE FURO-FORMOTEROL FUM 100-5 MCG/ACT IN AERO
2.0000 | INHALATION_SPRAY | Freq: Two times a day (BID) | RESPIRATORY_TRACT | Status: DC
  Administered 2017-02-25 – 2017-02-26 (×3): 2 via RESPIRATORY_TRACT
  Filled 2017-02-24 (×2): qty 8.8

## 2017-02-24 MED ORDER — HYDRALAZINE HCL 20 MG/ML IJ SOLN: 10.0000 mg | INTRAMUSCULAR | Status: DC | PRN

## 2017-02-24 MED ORDER — OXYCODONE-ACETAMINOPHEN 5-325 MG PO TABS
1.0000 | ORAL_TABLET | Freq: Once | ORAL | Status: AC
  Administered 2017-02-24: 1 via ORAL
  Filled 2017-02-24: qty 1

## 2017-02-24 MED ORDER — TIOTROPIUM BROMIDE MONOHYDRATE 18 MCG IN CAPS
18.0000 ug | ORAL_CAPSULE | Freq: Every evening | RESPIRATORY_TRACT | Status: DC
  Administered 2017-02-25: 18 ug via RESPIRATORY_TRACT
  Filled 2017-02-24: qty 5

## 2017-02-24 NOTE — ED Notes (Signed)
Pt ambulatory to the restroom.  

## 2017-02-24 NOTE — ED Triage Notes (Signed)
Pt arrives to ED from home with complaints of chest pressure since this morning. Pt states she has been having chest pain on and off for the past week but today was worse and it scared her. Pt given 324 aspirin en route, Cp gone upon arrival to ED. Pt has hx of stent placed in 2001. Pt placed in position of comfort with bed locked and lowered, call bell in reach.

## 2017-02-24 NOTE — ED Provider Notes (Signed)
[facility] EMERGENCY DEPARTMENT Provider Note   CSN: [id number] Arrival date & time: 02/24/17  1740     History   Chief Complaint Chief Complaint  Patient presents with  . Chest Pain  . Hypertension    HPI [name] is a 72 y.o. female.  HPI  72 year old female presents with chest pain or shortness of breath.  She has a prior stent several years ago and states this kind of feels similar.  For the past several days has been having on and off chest pain that is more right-sided but today she is having consistent constant chest pain that is left-sided.  It feels like a tightness.  She is also been short of breath with very minimal exertion including just a few feet.  Denies any cough.  She has chronic back pain but denies any new back pain and denies any vomiting, abdominal pain, or leg swelling.  At the moment I am talking to her she states her chest pain is essentially gone and is only a 1 or so out of 10.  She has already taken 324 mg aspirin.  She took her blood pressure medicine this morning.  When I am seeing the patient she has just walked back from the bathroom which is only a few feet away from her room she is significantly tachypneic and out of breath.  Past Medical History:  Diagnosis Date  . Alcoholism ([place])    recovering since 2000  . Anxiety   . Arthritis BACK  . Bipolar disorder ([place])   . Blepharospasm LEFT EYE  . Blepharospasm    left  . Blood transfusion   . Cervical cancer ([place]) 12/28/11    vagina upper right bx==squamous cell ca  . Chronic back pain   . Coronary artery disease CARDIOLOGIST- DR Martinique--- LAST VISIT NOTE 09-07-2009  W/ CHART  . Depression   . Emphysema   . History of alcohol abuse RECOVERING SINCE 2000  . HTN (hypertension)   . Hypertension   . Idiopathic acute facial nerve palsy LEFT SIDE--  BOTOX THERAPY  . Inferior MI ([place]) 2000--  POST PTCA W/ STENT X1  . Left renal mass 03/02/12  . Normal cardiac stress test  2008-- PER DR Martinique NOTE 09-07-2009  . Osteoporosis   . Other and unspecified general anesthetics causing adverse effect in therapeutic use post op delirium--  last anes record w/ chart  from   09-22-2009 (spinal w/ light sedation)  . Peripheral vascular disease ([place]) POST RIGHT CAROTID SURG.  1995  . PONV (postoperative nausea and vomiting)   . Renal cell carcinoma ([place]) 07/09/12   Left mass  . Rosacea LEFT FACIAL RASH  . S/P radiation therapy 02/21/2012   38.75 Gy HDR 5 Fractions- vaginal cuff  . Seizures ([place])    x 1 after abrupt discontinuation of  Clonidine  . Status post carotid endarterectomy RIGHT --  1995  . Status post primary angioplasty with coronary stent 2000--  POST INFERIOR MI  . Unstable balance WALKS W/ CANE  . Vaginal cancer [facility])     Patient Active Problem List   Diagnosis Date Noted  . Preoperative clearance 06/13/2012  . Unspecified gastritis and gastroduodenitis without mention of hemorrhage 03/05/2012  . Hypoxia 03/04/2012  . Former smoker 03/04/2012  . COPD (chronic obstructive pulmonary disease) ([place]) 03/04/2012  . Abdominal pain, acute, epigastric 03/02/2012  . Abnormal abdominal CT scan 03/02/2012  . Hypertension   . Depression   . Alcoholism (  [place])   . Bipolar disorder ([place])   . Status post carotid endarterectomy   . Arthritis   . Idiopathic acute facial nerve palsy   . Blepharospasm   . Anxiety   . Vaginal cancer ([place])   . Cancer of vaginal vault - recurrent from cervix 01/25/2012  . Cervical cancer ([place]) 12/28/2011  . Carotid artery disease ([place]) 06/28/2011  . Coronary artery disease   . Inferior MI ([place])   . Peripheral vascular disease ([place])   . HTN (hypertension)   . Dysplasia of vagina, histologically confirmed 12/14/2010    Past Surgical History:  Procedure Laterality Date  . CAROTID ENDARTERECTOMY  1995   RIGHT  . CATARACT EXTRACTION W/ INTRAOCULAR LENS  IMPLANT, BILATERAL    . CERVICAL CONIZATION W/BX  09-23-2008  . CORONARY  ANGIOPLASTY WITH STENT PLACEMENT  2000-   INFERIOR MI   X1 STENT TO RCA  . EUS N/A 03/05/2012   Procedure: FULL UPPER ENDOSCOPIC ULTRASOUND (EUS) RADIAL and EGD;  Surgeon: Milus Banister, MD;  Location: [facility] ENDOSCOPY;  Service: Endoscopy;  Laterality: N/A;  ercp scope first than eus scope  . HEMIARTHROPLASTY HIP  12-26-2008   LEFT FEMORAL NECK FX  . ORIF HIP FRACTURE  02-13-2007   RIGHT FEMORAL NECK FX  . ORIF RIGHT DISTAL RADIUS AND RIGHT PROXIMAL HUMEROUS NECK FX'S  10-10-2005  . RIGHT SHOULDER SURG.  2007  . ROBOTIC ASSITED PARTIAL NEPHRECTOMY Left 07/09/2012   Procedure: ROBOTIC ASSITED PARTIAL NEPHRECTOMY;  Surgeon: Dutch Gray, MD;  Location: [facility] ORS;  Service: Urology;  Laterality: Left;  . TOTAL HIP ARTHROPLASTY  04-15-2008   POST FAILED  RIGHT HIP ORIF FEMORAL FX  . TOTAL KNEE ARTHROPLASTY  09-22-2009   RIGHT  . UPPER RIGHT VAGINAL REGION  12/28/11   BIOPSY: SQUAMOUS CELL CARCINOMA  . VAGINAL HYSTERECTOMY  07-06-2009    OB History    No data available       Home Medications    Prior to Admission medications   Medication Sig Start Date End Date Taking? Authorizing Provider  albuterol (PROVENTIL HFA;VENTOLIN HFA) 108 (90 BASE) MCG/ACT inhaler Inhale 2 puffs into the lungs every 6 (six) hours as needed for wheezing.   Yes [provider]  amLODipine (NORVASC) 2.5 MG tablet Take 1 tablet (2.5 mg total) by mouth daily. Patient taking differently: Take 5 mg by mouth daily.  12/27/12  Yes Weaver, Raylinn Kosar T, PA-C  ARIPiprazole (ABILIFY) 20 MG tablet Take 20 mg by mouth every morning.   Yes [provider]  aspirin EC 81 MG tablet Take 81 mg by mouth every morning.    Yes [provider]  Calcium Carbonate-Vitamin D 600-400 MG-UNIT per tablet Take 1 tablet by mouth 2 (two) times daily.    Yes [provider]  Cholecalciferol (VITAMIN D3) 2000 UNITS TABS Take 2,000 Units by mouth 2 (two) times daily.    Yes [provider]  clonazePAM  (KLONOPIN) 1 MG tablet Take 1 mg 3 (three) times daily as needed by mouth for anxiety (she is currently taking .5 mg once daily. She is weaning herself with her doctors knowledge.).    Yes [provider]  cloNIDine (CATAPRES) 0.2 MG tablet Take 0.2 mg by mouth 2 (two) times daily.    Yes [provider]  cycloSPORINE (RESTASIS) 0.05 % ophthalmic emulsion Place 1 drop into both eyes 2 (two) times daily.   Yes [provider]  docusate sodium (COLACE) 100 MG capsule Take 1 capsule (  100 mg total) by mouth 2 (two) times daily. Patient taking differently: Take 100 mg by mouth daily.  07/12/12  Yes Raynelle Bring, MD  furosemide (LASIX) 20 MG tablet Take 20 mg by mouth every morning.   Yes [provider]  lamoTRIgine (LAMICTAL) 100 MG tablet Take 200 mg by mouth 2 (two) times daily.    Yes [provider]  mometasone-formoterol (DULERA) 100-5 MCG/ACT AERO Inhale 2 puffs into the lungs 2 (two) times daily. 03/06/12  Yes Bonnielee Haff, MD  Multiple Vitamin (MULTIVITAMIN) capsule Take 1 capsule by mouth every morning.    Yes [provider]  nitroGLYCERIN (NITROSTAT) 0.4 MG SL tablet Place 1 tablet (0.4 mg total) under the tongue every 5 (five) minutes as needed for chest pain. 03/02/15  Yes Martinique, Peter M, MD  olmesartan (BENICAR) 40 MG tablet Take 40 mg by mouth every morning.   Yes [provider]  oxyCODONE-acetaminophen (PERCOCET) 10-325 MG per tablet Take 1 tablet by mouth every 4 (four) hours.    Yes [provider]  polyethylene glycol (MIRALAX / GLYCOLAX) packet Take 17 g by mouth daily as needed for mild constipation or moderate constipation.    Yes [provider]  potassium chloride (KLOR-CON) 8 MEQ tablet Take 8 mEq by mouth every morning.    Yes [provider]  rosuvastatin (CRESTOR) 5 MG tablet Take 5 mg by mouth at bedtime.   Yes [provider]  sertraline (ZOLOFT) 50 MG tablet Take 50 mg by  mouth every morning.    Yes [provider]  tiotropium (SPIRIVA) 18 MCG inhalation capsule Place 18 mcg into inhaler and inhale every evening.  03/06/12  Yes Bonnielee Haff, MD  esomeprazole (NEXIUM) 20 MG capsule Take 20 mg by mouth daily before breakfast.    [provider]    Family History Family History  Problem Relation Age of Onset  . Hypertension Mother   . Hypertension Father     Social History Social History   Tobacco Use  . Smoking status: Former Smoker    Packs/day: 2.00    Years: 50.00    Pack years: 100.00    Types: Cigarettes    Last attempt to quit: 01/18/2011    Years since quitting: 6.1  . Smokeless tobacco: Never Used  . Tobacco comment: STATES QUIT SMOKING 01-18-2011  Substance Use Topics  . Alcohol use: No    Comment: RECOVERING ALCOHOLIC--   QUIT IN 7616  . Drug use: No     Allergies   Patient has no known allergies.   Review of Systems Review of Systems  Respiratory: Positive for shortness of breath. Negative for cough.   Cardiovascular: Positive for chest pain. Negative for leg swelling.  Gastrointestinal: Negative for abdominal pain and vomiting.  All other systems reviewed and are negative.    Physical Exam Updated Vital Signs BP (!) 187/101   Pulse 61   Temp 98.7 F (37.1 C) (Oral)   Resp 19   Ht 5\' 6"  (1.676 m)   Wt 78 kg (172 lb)   SpO2 92%   BMI 27.76 kg/m   Physical Exam  Constitutional: She is oriented to person, place, and time. She appears well-developed and well-nourished.  Non-toxic appearance. She does not appear ill. No distress.  HENT:  Head: Normocephalic and atraumatic.  Right Ear: External ear normal.  Left Ear: External ear normal.  Nose: Nose normal.  Eyes: Right eye exhibits no discharge. Left eye exhibits no discharge.  Cardiovascular: Normal rate, regular rhythm and normal heart sounds.  Pulses:      Radial pulses are 2+ on the right side, and 2+ on the left side.  Pulmonary/Chest:  Effort normal and breath sounds normal. She has no decreased breath sounds. She has no wheezes. She exhibits no tenderness.  Increased work of breathing initially when I had seen her as she had just come back from the bathroom but this slowly resolved and then she started breathing more normally  Abdominal: Soft. There is no tenderness.  Musculoskeletal:       Right lower leg: She exhibits no edema.       Left lower leg: She exhibits no edema.  Neurological: She is alert and oriented to person, place, and time.  Skin: Skin is warm and dry.  Nursing note and vitals reviewed.    ED Treatments / Results  Labs (all labs ordered are listed, but only abnormal results are displayed) Labs Reviewed  BASIC METABOLIC PANEL - Abnormal; Notable for the following components:      Result Value   Sodium 134 (*)    Chloride 98 (*)    CO2 21 (*)    All other components within normal limits  BRAIN NATRIURETIC PEPTIDE - Abnormal; Notable for the following components:   B Natriuretic Peptide 274.9 (*)    All other components within normal limits  D-DIMER, QUANTITATIVE (NOT AT [facility]) - Abnormal; Notable for the following components:   D-Dimer, Quant 0.98 (*)    All other components within normal limits  TROPONIN I  CBC WITH DIFFERENTIAL/PLATELET    EKG  EKG Interpretation  Date/Time:  Friday February 24 2017 17:56:10 EST Ventricular Rate:  57 PR Interval:    QRS Duration: 104 QT Interval:  420 QTC Calculation: 409 R Axis:   -20 Text Interpretation:  Sinus rhythm Borderline left axis deviation no significant change since 2013 Confirmed by Sherwood Gambler [phone]) on 02/24/2017 6:18:12 PM       Radiology Dg Chest 2 View  Result Date: 02/24/2017 CLINICAL DATA:  Chest pressure. EXAM: CHEST  2 VIEW COMPARISON:  August 04, 2016 FINDINGS: Mild cardiomegaly. Stable torturous thoracic aorta. The hila and mediastinum are otherwise normal. No pulmonary nodules, masses, or focal infiltrates. There is a  compression fracture of a lower thoracic vertebral body, similar in appearance since February 19, 2014. No other acute abnormalities. IMPRESSION: No active cardiopulmonary disease. Electronically Signed   By: Dorise Bullion III M.D   On: 02/24/2017 20:08   Ct Angio Chest Pe W/cm &/or Wo Cm  Result Date: 02/24/2017 CLINICAL DATA:  Intermittent chest pain and shortness of breath for 1 week. EXAM: CT ANGIOGRAPHY CHEST WITH CONTRAST TECHNIQUE: Multidetector CT imaging of the chest was performed using the standard protocol during bolus administration of intravenous contrast. Multiplanar CT image reconstructions and MIPs were obtained to evaluate the vascular anatomy. CONTRAST:  175mL ISOVUE-370 IOPAMIDOL (ISOVUE-370) INJECTION 76% COMPARISON:  None. FINDINGS: Cardiovascular: Heart is mildly enlarged. Coronary artery calcifications are present. No significant pericardial effusion is present. Vascular calcifications are present at the aortic arch and origins of the great vessels. Pulmonary artery opacification is excellent. No focal filling defects are present to suggest pulmonary emboli. Mediastinum/Nodes: Paratracheal lymph nodes measure up to 8 mm. No significant hilar or axillary adenopathy is present. Lungs/Pleura: Centrilobular emphysematous changes are present. Superimposed ground-glass attenuation is noted. There is mild dependent atelectasis bilaterally without significant consolidation. Upper Abdomen: Extensive vascular calcifications are present. Visualized liver, spleen, pancreas, kidneys  are unremarkable. Musculoskeletal: Remote T12 compression fracture is associated with focal kyphosis. Vertebral body heights and alignment are otherwise normal. No focal lytic or blastic lesions are present. Soft tissue densities are present in the breasts bilaterally. Review of the MIP images confirms the above findings. IMPRESSION: 1. Cardiomegaly with mild ground-glass attenuation superimposed on emphysema compatible  with edema and congestive heart failure. 2. Mild bibasilar atelectasis. 3. No pulmonary embolus. 4.  Aortic Atherosclerosis (ICD10-I70.0). 5.  Emphysema (ICD10-J43.9). 6. Coronary artery disease. 7. Remote T12 compression fracture with associated kyphosis. 8. Asymmetric soft tissue is again noted in the breasts bilaterally. Previous mammogram demonstrated this to be within normal limits. Electronically Signed   By: San Morelle M.D.   On: 02/24/2017 21:22    Procedures Procedures (including critical care time)  Medications Ordered in ED Medications  nitroGLYCERIN (NITROSTAT) SL tablet 0.4 mg (0.4 mg Sublingual Given 02/24/17 2007)  iopamidol (ISOVUE-370) 76 % injection (100 mLs  Contrast Given 02/24/17 2042)  furosemide (LASIX) injection 20 mg (20 mg Intravenous Given 02/24/17 2154)     Initial Impression / Assessment and Plan / ED Course  I have reviewed the triage vital signs and the nursing notes.  Pertinent labs & imaging results that were available during my care of the patient were reviewed by me and considered in my medical decision making (see chart for details).     Patient likely has CHF.  Given her chest pain there is concern for angina as well although her first troponin is negative.  She was given aspirin prior to arrival.  Workup for PE shows positive d-dimer and so a CTA was obtained but does not show PE.  However there is some evidence of CHF and cardiomegaly.  She is not in distress but with her significant hypertension she will need better blood pressure control and diuresis.  When given nitroglycerin she did seem to have much better blood pressure control and better exertional dyspnea symptoms.  Admit to the hospitalist.  Final Clinical Impressions(s) / ED Diagnoses   Final diagnoses:  Acute congestive heart failure, unspecified heart failure type [facility])    ED Discharge Orders    None       Sherwood Gambler, MD 02/24/17 2200

## 2017-02-24 NOTE — H&P (Signed)
History and Physical    [name] PNT:[id number] DOB: 06-08-1945 DOA: 02/24/2017  PCP: Harlan Stains, MD  Patient coming from: home   Chief Complaint: chest pain and doe  HPI: [name] is a 71 y.o. female with medical history significant for mi/cad in 2000 with stent placed, htn, bipolar disorder, copd, former smoking, who presents with 1 day of substernal chest tightness/discomfort and one day of dyspnea on exertion. Last time had pain like this was in 2000 when had MI. Is not painful, but uncomfortable. Happens at rest, is not exertional. But does have dyspnea with ambulation, not at rest. Pain does not radiate. No cough or increased sputum production. No hemoptysis or fever. Normal appetite, no n/v/d. No leg swelling. Sleeps sitting up because of back pain. Denies history of venous thrombus. No recent med changes. Is compliant w/ meds but hasn't had evening meds today.  ED Course: aspirin prior to arrival. Lasix, nitro, labs, cta  Review of Systems: As per HPI otherwise 10 point review of systems negative.    Past Medical History:  Diagnosis Date  . Alcoholism ([place])    recovering since 2000  . Anxiety   . Arthritis BACK  . Bipolar disorder ([place])   . Blepharospasm LEFT EYE  . Blepharospasm    left  . Blood transfusion   . Cervical cancer ([place]) 12/28/11    vagina upper right bx==squamous cell ca  . CHF exacerbation ([place]) 02/24/2017  . Chronic back pain   . Coronary artery disease CARDIOLOGIST- DR Martinique--- LAST VISIT NOTE 09-07-2009  W/ CHART  . Depression   . Emphysema   . History of alcohol abuse RECOVERING SINCE 2000  . HTN (hypertension)   . Hypertension   . Idiopathic acute facial nerve palsy LEFT SIDE--  BOTOX THERAPY  . Inferior MI ([place]) 2000--  POST PTCA W/ STENT X1  . Left renal mass 03/02/12  . Normal cardiac stress test 2008-- PER DR Martinique NOTE 09-07-2009  . Osteoporosis   . Other and unspecified general anesthetics causing adverse effect in  therapeutic use post op delirium--  last anes record w/ chart  from   09-22-2009 (spinal w/ light sedation)  . Peripheral vascular disease ([place]) POST RIGHT CAROTID SURG.  1995  . PONV (postoperative nausea and vomiting)   . Renal cell carcinoma ([place]) 07/09/12   Left mass  . Rosacea LEFT FACIAL RASH  . S/P radiation therapy 02/21/2012   38.75 Gy HDR 5 Fractions- vaginal cuff  . Seizures ([place])    x 1 after abrupt discontinuation of  Clonidine  . Status post carotid endarterectomy RIGHT --  1995  . Status post primary angioplasty with coronary stent 2000--  POST INFERIOR MI  . Unstable balance WALKS W/ CANE  . Vaginal cancer [facility])     Past Surgical History:  Procedure Laterality Date  . CAROTID ENDARTERECTOMY  1995   RIGHT  . CATARACT EXTRACTION W/ INTRAOCULAR LENS  IMPLANT, BILATERAL    . CERVICAL CONIZATION W/BX  09-23-2008  . CORONARY ANGIOPLASTY WITH STENT PLACEMENT  2000-   INFERIOR MI   X1 STENT TO RCA  . EUS N/A 03/05/2012   Procedure: FULL UPPER ENDOSCOPIC ULTRASOUND (EUS) RADIAL and EGD;  Surgeon: Milus Banister, MD;  Location: [facility] ENDOSCOPY;  Service: Endoscopy;  Laterality: N/A;  ercp scope first than eus scope  . HEMIARTHROPLASTY HIP  12-26-2008   LEFT FEMORAL NECK FX  . ORIF HIP FRACTURE  02-13-2007   RIGHT FEMORAL NECK FX  .  ORIF RIGHT DISTAL RADIUS AND RIGHT PROXIMAL HUMEROUS NECK FX'S  10-10-2005  . RIGHT SHOULDER SURG.  2007  . ROBOTIC ASSITED PARTIAL NEPHRECTOMY Left 07/09/2012   Procedure: ROBOTIC ASSITED PARTIAL NEPHRECTOMY;  Surgeon: Dutch Gray, MD;  Location: [facility] ORS;  Service: Urology;  Laterality: Left;  . TOTAL HIP ARTHROPLASTY  04-15-2008   POST FAILED  RIGHT HIP ORIF FEMORAL FX  . TOTAL KNEE ARTHROPLASTY  09-22-2009   RIGHT  . UPPER RIGHT VAGINAL REGION  12/28/11   BIOPSY: SQUAMOUS CELL CARCINOMA  . VAGINAL HYSTERECTOMY  07-06-2009     reports that she quit smoking about 6 years ago. Her smoking use included cigarettes. She has a 100.00 pack-year smoking  history. she has never used smokeless tobacco. She reports that she does not drink alcohol or use drugs.  No Known Allergies  Family History  Problem Relation Age of Onset  . Hypertension Mother   . Hypertension Father     Prior to Admission medications   Medication Sig Start Date End Date Taking? Authorizing Provider  albuterol (PROVENTIL HFA;VENTOLIN HFA) 108 (90 BASE) MCG/ACT inhaler Inhale 2 puffs into the lungs every 6 (six) hours as needed for wheezing.   Yes [provider]  amLODipine (NORVASC) 2.5 MG tablet Take 1 tablet (2.5 mg total) by mouth daily. Patient taking differently: Take 5 mg by mouth daily.  12/27/12  Yes Weaver, Scott T, PA-C  ARIPiprazole (ABILIFY) 20 MG tablet Take 20 mg by mouth every morning.   Yes [provider]  aspirin EC 81 MG tablet Take 81 mg by mouth every morning.    Yes [provider]  Calcium Carbonate-Vitamin D 600-400 MG-UNIT per tablet Take 1 tablet by mouth 2 (two) times daily.    Yes [provider]  Cholecalciferol (VITAMIN D3) 2000 UNITS TABS Take 2,000 Units by mouth 2 (two) times daily.    Yes [provider]  cloNIDine (CATAPRES) 0.2 MG tablet Take 0.2 mg by mouth 2 (two) times daily.    Yes [provider]  cycloSPORINE (RESTASIS) 0.05 % ophthalmic emulsion Place 1 drop into both eyes 2 (two) times daily.   Yes [provider]  docusate sodium (COLACE) 100 MG capsule Take 1 capsule (100 mg total) by mouth 2 (two) times daily. Patient taking differently: Take 100 mg by mouth daily.  07/12/12  Yes Raynelle Bring, MD  furosemide (LASIX) 20 MG tablet Take 20 mg by mouth every morning.   Yes [provider]  lamoTRIgine (LAMICTAL) 100 MG tablet Take 200 mg by mouth 2 (two) times daily.    Yes [provider]  mometasone-formoterol (DULERA) 100-5 MCG/ACT AERO Inhale 2 puffs into the lungs 2 (two) times daily. 03/06/12  Yes Bonnielee Haff, MD  Multiple Vitamin  (MULTIVITAMIN) capsule Take 1 capsule by mouth every morning.    Yes [provider]  nitroGLYCERIN (NITROSTAT) 0.4 MG SL tablet Place 1 tablet (0.4 mg total) under the tongue every 5 (five) minutes as needed for chest pain. 03/02/15  Yes Martinique, Peter M, MD  olmesartan (BENICAR) 40 MG tablet Take 40 mg by mouth every morning.   Yes [provider]  oxyCODONE-acetaminophen (PERCOCET) 10-325 MG per tablet Take 1 tablet by mouth every 4 (four) hours.    Yes [provider]  polyethylene glycol (MIRALAX / GLYCOLAX) packet Take 17 g by mouth daily as needed for mild constipation or moderate constipation.    Yes [provider]  potassium chloride (KLOR-CON) 8 MEQ tablet  Take 8 mEq by mouth every morning.    Yes [provider]  rosuvastatin (CRESTOR) 5 MG tablet Take 5 mg by mouth at bedtime.   Yes [provider]  sertraline (ZOLOFT) 50 MG tablet Take 50 mg by mouth every morning.    Yes [provider]  tiotropium (SPIRIVA) 18 MCG inhalation capsule Place 18 mcg into inhaler and inhale every evening.  03/06/12  Yes Bonnielee Haff, MD  esomeprazole (NEXIUM) 20 MG capsule Take 20 mg by mouth daily before breakfast.    [provider]    Physical Exam: Vitals:   02/24/17 2030 02/24/17 2045 02/24/17 2100 02/24/17 2130  BP: (!) 159/85 (!) 181/82 (!) 187/101 (!) 162/81  Pulse: 61 71 61 (!) 59  Resp: 15 20 19 17   Temp:      TempSrc:      SpO2: 93% 93% 92% 91%  Weight:      Height:        Constitutional: No acute distress Head: Atraumatic Eyes: Conjunctiva clear ENM: Moist mucous membranes. Normal dentition.  Neck: Supple Respiratory: Clear to auscultation bilaterally, no wheezing/rales/rhonchi. Normal respiratory effort. No accessory muscle use. . Cardiovascular: Regular rate and rhythm. No murmurs/rubs/gallops. Abdomen: Non-tender, non-distended. No masses. No rebound or guarding. Positive bowel sounds. Musculoskeletal:  No joint deformity upper and lower extremities. Normal ROM, no contractures. Normal muscle tone.  Skin: No rashes, lesions, or ulcers.  Extremities: No peripheral edema. Palpable peripheral pulses. Neurologic: Alert, moving all 4 extremities. Psychiatric: Normal insight and judgement.   Labs on Admission: I have personally reviewed following labs and imaging studies  CBC: Recent Labs  Lab 02/24/17 1902  WBC 5.7  NEUTROABS 3.4  HGB 14.1  HCT 39.3  MCV 91.0  PLT 109   Basic Metabolic Panel: Recent Labs  Lab 02/24/17 1902  NA 134*  K 3.9  CL 98*  CO2 21*  GLUCOSE 93  BUN 8  CREATININE 0.83  CALCIUM 9.8   GFR: Estimated Creatinine Clearance: 64.6 mL/min (by C-G formula based on SCr of 0.83 mg/dL). Liver Function Tests: No results for input(s): AST, ALT, ALKPHOS, BILITOT, PROT, ALBUMIN in the last 168 hours. No results for input(s): LIPASE, AMYLASE in the last 168 hours. No results for input(s): AMMONIA in the last 168 hours. Coagulation Profile: No results for input(s): INR, PROTIME in the last 168 hours. Cardiac Enzymes: Recent Labs  Lab 02/24/17 1902  TROPONINI <0.03   BNP (last 3 results) No results for input(s): PROBNP in the last 8760 hours. HbA1C: No results for input(s): HGBA1C in the last 72 hours. CBG: No results for input(s): GLUCAP in the last 168 hours. Lipid Profile: No results for input(s): CHOL, HDL, LDLCALC, TRIG, CHOLHDL, LDLDIRECT in the last 72 hours. Thyroid Function Tests: No results for input(s): TSH, T4TOTAL, FREET4, T3FREE, THYROIDAB in the last 72 hours. Anemia Panel: No results for input(s): VITAMINB12, FOLATE, FERRITIN, TIBC, IRON, RETICCTPCT in the last 72 hours. Urine analysis:    Component Value Date/Time   COLORURINE YELLOW 10/10/2015 [place] 10/10/2015 0833   LABSPEC 1.012 10/10/2015 0833   PHURINE 6.5 10/10/2015 0833   GLUCOSEU NEGATIVE 10/10/2015 0833   HGBUR NEGATIVE 10/10/2015 0833   BILIRUBINUR  NEGATIVE 10/10/2015 0833   KETONESUR NEGATIVE 10/10/2015 0833   PROTEINUR NEGATIVE 10/10/2015 0833   UROBILINOGEN 0.2 03/07/2012 1234   NITRITE NEGATIVE 10/10/2015 0833   LEUKOCYTESUR NEGATIVE 10/10/2015 0833    Radiological Exams on Admission: Dg Chest 2 View  Result  Date: 02/24/2017 CLINICAL DATA:  Chest pressure. EXAM: CHEST  2 VIEW COMPARISON:  August 04, 2016 FINDINGS: Mild cardiomegaly. Stable torturous thoracic aorta. The hila and mediastinum are otherwise normal. No pulmonary nodules, masses, or focal infiltrates. There is a compression fracture of a lower thoracic vertebral body, similar in appearance since February 19, 2014. No other acute abnormalities. IMPRESSION: No active cardiopulmonary disease. Electronically Signed   By: Dorise Bullion III M.D   On: 02/24/2017 20:08   Ct Angio Chest Pe W/cm &/or Wo Cm  Result Date: 02/24/2017 CLINICAL DATA:  Intermittent chest pain and shortness of breath for 1 week. EXAM: CT ANGIOGRAPHY CHEST WITH CONTRAST TECHNIQUE: Multidetector CT imaging of the chest was performed using the standard protocol during bolus administration of intravenous contrast. Multiplanar CT image reconstructions and MIPs were obtained to evaluate the vascular anatomy. CONTRAST:  112mL ISOVUE-370 IOPAMIDOL (ISOVUE-370) INJECTION 76% COMPARISON:  None. FINDINGS: Cardiovascular: Heart is mildly enlarged. Coronary artery calcifications are present. No significant pericardial effusion is present. Vascular calcifications are present at the aortic arch and origins of the great vessels. Pulmonary artery opacification is excellent. No focal filling defects are present to suggest pulmonary emboli. Mediastinum/Nodes: Paratracheal lymph nodes measure up to 8 mm. No significant hilar or axillary adenopathy is present. Lungs/Pleura: Centrilobular emphysematous changes are present. Superimposed ground-glass attenuation is noted. There is mild dependent atelectasis bilaterally without significant  consolidation. Upper Abdomen: Extensive vascular calcifications are present. Visualized liver, spleen, pancreas, kidneys are unremarkable. Musculoskeletal: Remote T12 compression fracture is associated with focal kyphosis. Vertebral body heights and alignment are otherwise normal. No focal lytic or blastic lesions are present. Soft tissue densities are present in the breasts bilaterally. Review of the MIP images confirms the above findings. IMPRESSION: 1. Cardiomegaly with mild ground-glass attenuation superimposed on emphysema compatible with edema and congestive heart failure. 2. Mild bibasilar atelectasis. 3. No pulmonary embolus. 4.  Aortic Atherosclerosis (ICD10-I70.0). 5.  Emphysema (ICD10-J43.9). 6. Coronary artery disease. 7. Remote T12 compression fracture with associated kyphosis. 8. Asymmetric soft tissue is again noted in the breasts bilaterally. Previous mammogram demonstrated this to be within normal limits. Electronically Signed   By: San Morelle M.D.   On: 02/24/2017 21:22    EKG: Independently reviewed. No ischemic changes  Assessment/Plan Active Problems:   Coronary artery disease   HTN (hypertension)   Depression   Bipolar disorder ([facility])   COPD (chronic obstructive pulmonary disease) ([facility])   CHF exacerbation ([facility])   Chest pain   Acute exacerbation of CHF (congestive heart failure) ([place])   # CHF exacerbation - last tte was 2014, unremarkable study. But hx of CAD, here with elevated bnp, DOE, and CTA showing pulmonary changes consistent w/ CHF exacerbation. Also being evaluated for possible ACS, though initial troponin and EKG both reassuring. D-dimer mildly elevated; CTA negative for PE. No cough, lungs clear - do not think copd exacerbation or infectious etiology - lasix 20 mg IV qd (from 20 po qd at home) - TTE ordered - daily weights and I/os, says dry weight is 170 lbs  # Chest pain - atypical, but at moderate to elevated risk given CAD history. Initial troponin  and ECG reassuring. Received asa. - trend troponins - repeat ekg in AM - tele overnight - TTE as above - possible stress test vs cards consult in AM; NPO after midnight  # HTN - here bp severely elevated. Pt says normally controlled at home. Missed night meds. - hydral IV prn - continue home meds (amlodipine,  clonidine, benicar, aspirin)  # COPD - no signs exacerbation - continue home dulera, albuterol, tiotropium  # Bipolar disorder - continue home sertraline, aripiprazole, lamotrigine   DVT prophylaxis: heparin, scds Code Status: dnr, confirmed w/ pt  Family Communication: friend and roommate mary carlton [phone]  Disposition Plan: likely home  Consults called: none  Admission status: tele    Desma Maxim MD [facility] Pager [phone]  If 7PM-7AM, please contact night-coverage [email or web address] Password [email or web address]  02/24/2017, 11:13 PM

## 2017-02-24 NOTE — ED Notes (Signed)
Patient transported to CT 

## 2017-02-24 NOTE — ED Notes (Signed)
Hospitalist bedside 

## 2017-02-25 LAB — BASIC METABOLIC PANEL
ANION GAP: 15 (ref 5–15)
ANION GAP: 12 (ref 5–15)
BUN: 5 mg/dL — AB (ref 6–20)
BUN: 7 mg/dL (ref 6–20)
CALCIUM: 8.5 mg/dL — AB (ref 8.9–10.3)
CALCIUM: 9.7 mg/dL (ref 8.9–10.3)
CO2: 17 mmol/L — AB (ref 22–32)
CO2: 23 mmol/L (ref 22–32)
CREATININE: 0.73 mg/dL (ref 0.44–1.00)
CREATININE: 0.88 mg/dL (ref 0.44–1.00)
Chloride: 103 mmol/L (ref 101–111)
Chloride: 100 mmol/L — ABNORMAL LOW (ref 101–111)
GFR calc Af Amer: >60 mL/min (ref >60)
GFR calc Af Amer: >60 mL/min (ref >60)
GLUCOSE: 92 mg/dL (ref 65–99)
GLUCOSE: 107 mg/dL — AB (ref 65–99)
Potassium: 3.4 mmol/L — ABNORMAL LOW (ref 3.5–5.1)
Potassium: 3.9 mmol/L (ref 3.5–5.1)
Sodium: 135 mmol/L (ref 135–145)
Sodium: 135 mmol/L (ref 135–145)

## 2017-02-25 LAB — HEMOGLOBIN A1C
HEMOGLOBIN A1C: 5.9 % — AB (ref 4.8–5.6)
MEAN PLASMA GLUCOSE: 122.63 mg/dL

## 2017-02-25 LAB — CREATININE, SERUM
CREATININE: 0.91 mg/dL (ref 0.44–1.00)
GFR calc Af Amer: >60 mL/min (ref >60)

## 2017-02-25 LAB — TROPONIN I: Troponin I: <0.03 ng/mL (ref <0.03)

## 2017-02-25 LAB — CBC
HCT: 42.3 % (ref 36.0–46.0)
Hemoglobin: 15.2 g/dL — ABNORMAL HIGH (ref 12.0–15.0)
MCH: 32.9 pg (ref 26.0–34.0)
MCHC: 35.9 g/dL (ref 30.0–36.0)
MCV: 91.6 fL (ref 78.0–100.0)
PLATELETS: 295 10*3/uL (ref 150–400)
RBC: 4.62 MIL/uL (ref 3.87–5.11)
RDW: 12.7 % (ref 11.5–15.5)
WBC: 5.9 10*3/uL (ref 4.0–10.5)

## 2017-02-25 MED ORDER — ARTIFICIAL TEARS OPHTHALMIC OINT
1.0000 "application " | TOPICAL_OINTMENT | Freq: Once | OPHTHALMIC | Status: AC
  Administered 2017-02-25: 1 via OPHTHALMIC
  Filled 2017-02-25 (×2): qty 3.5

## 2017-02-25 MED ORDER — ALBUTEROL SULFATE (2.5 MG/3ML) 0.083% IN NEBU: 2.5000 mg | INHALATION_SOLUTION | Freq: Four times a day (QID) | RESPIRATORY_TRACT | Status: DC | PRN

## 2017-02-25 NOTE — Progress Notes (Signed)
New pt admission from 5 C. Pt brought to the floor in stable condition. Vitals taken. Initial Assessment done. All immediate pertinent needs to patient addressed. Patient Guide given to patient. Important safety instructions relating to hospitalization reviewed with patient. Patient verbalized understanding. Will continue to monitor pt.  Heart Failure booklet provided and educate the patient regarding diet, medicine, weight and follow up  Palma Holter, Therapist, sports

## 2017-02-25 NOTE — ED Notes (Signed)
Pt reports she uses OTC lubricating eye ointment at home for dry eyes. Pt does not remember the name but is requesting some at this time. Dr Marthenia Rolling paged

## 2017-02-25 NOTE — Progress Notes (Signed)
PROGRESS NOTE    LENNI RECKNER  BMW:[id number] DOB: [birth year] DOA: 02/24/2017 PCP: Harlan Stains, MD  Outpatient Specialists:    Brief Narrative:  The patient is a 72 year old female with past medical history significant for myocardial infarction, coronary artery disease 2000 status post PCI, COPD, reformed tobacco user.  Patient presented with chest pain.  There are also concerns for possible congestive heart failure.  Patient tells me that she only uses 1 pill at nighttime.  Patient denied any leg edema.  Patient mentions that her problem was chest pain that was similar to the chest pain she had in 2000.  Patient has been admitted for further assessment and management of the chest pain.  The troponin has remained stable.  We will pursue echocardiogram and cardiac stress test.  If both are non-revealing, we will pursue disposition.   Assessment & Plan:   Active Problems:   Coronary artery disease   HTN (hypertension)   Depression   Bipolar disorder (HCC)   COPD (chronic obstructive pulmonary disease) (H[facility])   CHF exacerbation ([facility])   Chest pain   Acute congestive heart failure (H[facility])   Chest pain: -Patient has history of coronary artery disease status post PCI.  According to the patient, current chest pain seems similar to the chest pain she had when she had an MI. -Troponin has remained stable.  EKG personally reviewed by me. -We will proceed echocardiogram and a cardiac stress test. -Disposition will depend on above.  Possible congestive heart failure. -Continue current workup. Continue current medications. Follow-up echocardiogram.  COPD, history code. Patient is not in exacerbation. This is stable.  Hypertensive urgency. This is better controlled. Continue current medication.     DVT prophylaxis: Subcu heparin. Code Status: DNR Family Communication: None Disposition Plan: Home is eventually.   Consultants:   None  Procedures:   None  Antimicrobials:    None   Subjective: No new complaints No chest pain No shortness of breath  Objective: Vitals:   02/25/17 0900 02/25/17 1000 02/25/17 1200 02/25/17 1300  BP: 140/83 (!) 161/85 120/67 128/81  Pulse: (!) 56 65 (!) 59 (!) 55  Resp: 12 19 11 15   Temp:      TempSrc:      SpO2: 94% 96% 93% 95%  Weight:      Height:       No intake or output data in the 24 hours ending 02/25/17 1339 Filed Weights   02/24/17 1758  Weight: 78 kg (172 lb)    Examination:  General exam: Appears calm and comfortable  Respiratory system: Clear to auscultation. Respiratory effort normal. Cardiovascular system: S1 & S2 heard, RRR. No JVD, murmurs, rubs, gallops or clicks. No pedal edema. Gastrointestinal system: Abdomen is nondistended, soft and nontender. No organomegaly or masses felt. Normal bowel sounds heard. Central nervous system: Alert and oriented. No focal neurological deficits. Extremities: There is no leg edema.    Data Reviewed: I have personally reviewed following labs and imaging studies  CBC: Recent Labs  Lab 02/24/17 1902 02/24/17 2339  WBC 5.7 5.9  NEUTROABS 3.4  --   HGB 14.1 15.2*  HCT 39.3 42.3  MCV 91.0 91.6  PLT 257 272   Basic Metabolic Panel: Recent Labs  Lab 02/24/17 1902 02/24/17 2339 02/25/17 0239 02/25/17 0700  NA 134*  --  135 135  K 3.9  --  3.4* 3.9  CL 98*  --  103 100*  CO2 21*  --  17* 23  GLUCOSE 93  --  92 107*  BUN 8  --  5* 7  CREATININE 0.83 0.91 0.73 0.88  CALCIUM 9.8  --  8.5* 9.7   GFR: Estimated Creatinine Clearance: 60.9 mL/min (by C-G formula based on SCr of 0.88 mg/dL). Liver Function Tests: No results for input(s): AST, ALT, ALKPHOS, BILITOT, PROT, ALBUMIN in the last 168 hours. No results for input(s): LIPASE, AMYLASE in the last 168 hours. No results for input(s): AMMONIA in the last 168 hours. Coagulation Profile: No results for input(s): INR, PROTIME in the last 168 hours. Cardiac Enzymes: Recent Labs  Lab  02/24/17 1902 02/25/17 0200 02/25/17 0700  TROPONINI <0.03 <0.03 <0.03   BNP (last 3 results) No results for input(s): PROBNP in the last 8760 hours. HbA1C: Recent Labs    02/24/17 2339  HGBA1C 5.9*   CBG: No results for input(s): GLUCAP in the last 168 hours. Lipid Profile: No results for input(s): CHOL, HDL, LDLCALC, TRIG, CHOLHDL, LDLDIRECT in the last 72 hours. Thyroid Function Tests: No results for input(s): TSH, T4TOTAL, FREET4, T3FREE, THYROIDAB in the last 72 hours. Anemia Panel: No results for input(s): VITAMINB12, FOLATE, FERRITIN, TIBC, IRON, RETICCTPCT in the last 72 hours. Urine analysis:    Component Value Date/Time   COLORURINE YELLOW 10/10/2015 [place] 10/10/2015 0833   LABSPEC 1.012 10/10/2015 0833   PHURINE 6.5 10/10/2015 0833   GLUCOSEU NEGATIVE 10/10/2015 0833   HGBUR NEGATIVE 10/10/2015 0833   BILIRUBINUR NEGATIVE 10/10/2015 0833   KETONESUR NEGATIVE 10/10/2015 0833   PROTEINUR NEGATIVE 10/10/2015 0833   UROBILINOGEN 0.2 03/07/2012 1234   NITRITE NEGATIVE 10/10/2015 0833   LEUKOCYTESUR NEGATIVE 10/10/2015 0833   Sepsis Labs: @LABRCNTIP (procalcitonin:4,lacticidven:4)  )No results found for this or any previous visit (from the past 240 hour(s)).       Radiology Studies: Dg Chest 2 View  Result Date: 02/24/2017 CLINICAL DATA:  Chest pressure. EXAM: CHEST  2 VIEW COMPARISON:  August 04, 2016 FINDINGS: Mild cardiomegaly. Stable torturous thoracic aorta. The hila and mediastinum are otherwise normal. No pulmonary nodules, masses, or focal infiltrates. There is a compression fracture of a lower thoracic vertebral body, similar in appearance since February 19, 2014. No other acute abnormalities. IMPRESSION: No active cardiopulmonary disease. Electronically Signed   By: Dorise Bullion III M.D   On: 02/24/2017 20:08   Ct Angio Chest Pe W/cm &/or Wo Cm  Result Date: 02/24/2017 CLINICAL DATA:  Intermittent chest pain and shortness of breath  for 1 week. EXAM: CT ANGIOGRAPHY CHEST WITH CONTRAST TECHNIQUE: Multidetector CT imaging of the chest was performed using the standard protocol during bolus administration of intravenous contrast. Multiplanar CT image reconstructions and MIPs were obtained to evaluate the vascular anatomy. CONTRAST:  129mL ISOVUE-370 IOPAMIDOL (ISOVUE-370) INJECTION 76% COMPARISON:  None. FINDINGS: Cardiovascular: Heart is mildly enlarged. Coronary artery calcifications are present. No significant pericardial effusion is present. Vascular calcifications are present at the aortic arch and origins of the great vessels. Pulmonary artery opacification is excellent. No focal filling defects are present to suggest pulmonary emboli. Mediastinum/Nodes: Paratracheal lymph nodes measure up to 8 mm. No significant hilar or axillary adenopathy is present. Lungs/Pleura: Centrilobular emphysematous changes are present. Superimposed ground-glass attenuation is noted. There is mild dependent atelectasis bilaterally without significant consolidation. Upper Abdomen: Extensive vascular calcifications are present. Visualized liver, spleen, pancreas, kidneys are unremarkable. Musculoskeletal: Remote T12 compression fracture is associated with focal kyphosis. Vertebral body heights and alignment are otherwise normal. No focal lytic or blastic lesions are present. Soft tissue densities are  present in the breasts bilaterally. Review of the MIP images confirms the above findings. IMPRESSION: 1. Cardiomegaly with mild ground-glass attenuation superimposed on emphysema compatible with edema and congestive heart failure. 2. Mild bibasilar atelectasis. 3. No pulmonary embolus. 4.  Aortic Atherosclerosis (ICD10-I70.0). 5.  Emphysema (ICD10-J43.9). 6. Coronary artery disease. 7. Remote T12 compression fracture with associated kyphosis. 8. Asymmetric soft tissue is again noted in the breasts bilaterally. Previous mammogram demonstrated this to be within normal  limits. Electronically Signed   By: San Morelle M.D.   On: 02/24/2017 21:22        Scheduled Meds: . amLODipine  5 mg Oral Daily  . ARIPiprazole  20 mg Oral [id number]  . aspirin EC  81 mg Oral q morning - 10a  . calcium-vitamin D  1 tablet Oral BID  . cholecalciferol  2,000 Units Oral BID  . cloNIDine  0.2 mg Oral BID  . cycloSPORINE  1 drop Both Eyes BID  . docusate sodium  100 mg Oral Daily  . furosemide  20 mg Intravenous Daily  . heparin injection (subcutaneous)  5,000 Units Subcutaneous Q8H  . irbesartan  300 mg Oral Daily  . lamoTRIgine  200 mg Oral BID  . mometasone-formoterol  2 puff Inhalation BID  . pantoprazole  40 mg Oral Daily  . sertraline  50 mg Oral q morning - 10a  . tiotropium  18 mcg Inhalation QPM   Continuous Infusions:   LOS: 1 day    Time spent: 35 Minutes.    Dana Allan, MD  [facility] Pager #: [phone] 7PM-7AM contact night coverage as above

## 2017-02-25 NOTE — ED Notes (Signed)
Pt ambulated to restroom with stand by assist, upon returning to room pt O2 sats 88% RA. Pt placed on 2L [place]

## 2017-02-25 NOTE — ED Notes (Signed)
Label sent to main lab to add bmp.

## 2017-02-26 ENCOUNTER — Inpatient Hospital Stay (HOSPITAL_COMMUNITY): Payer: Medicare Other

## 2017-02-26 DIAGNOSIS — R0789 Other chest pain: Secondary | ICD-10-CM

## 2017-02-26 DIAGNOSIS — F319 Bipolar disorder, unspecified: Secondary | ICD-10-CM

## 2017-02-26 DIAGNOSIS — I509 Heart failure, unspecified: Secondary | ICD-10-CM

## 2017-02-26 DIAGNOSIS — I10 Essential (primary) hypertension: Secondary | ICD-10-CM

## 2017-02-26 DIAGNOSIS — I25118 Atherosclerotic heart disease of native coronary artery with other forms of angina pectoris: Secondary | ICD-10-CM

## 2017-02-26 DIAGNOSIS — R0600 Dyspnea, unspecified: Secondary | ICD-10-CM

## 2017-02-26 DIAGNOSIS — F329 Major depressive disorder, single episode, unspecified: Secondary | ICD-10-CM

## 2017-02-26 DIAGNOSIS — J439 Emphysema, unspecified: Secondary | ICD-10-CM

## 2017-02-26 DIAGNOSIS — R079 Chest pain, unspecified: Secondary | ICD-10-CM

## 2017-02-26 LAB — ECHOCARDIOGRAM COMPLETE
AOASC: 38 cm
Area-P 1/2: 1.75 cm2
CHL CUP MV DEC (S): 430
EWDT: 430 ms
FS: 33 % (ref 28–44)
HEIGHTINCHES: 66 in
IVS/LV PW RATIO, ED: 1.07
LA vol index: 30.7 mL/m2
LA vol: 57.9 mL
LADIAMINDEX: 1.96 cm/m2
LASIZE: 37 mm
LAVOLA4C: 53.5 mL
LEFT ATRIUM END SYS DIAM: 37 mm
LVOT area: 3.8 cm2
LVOTD: 22 mm
MV pk E vel: 33.6 m/s
MVPKAVEL: 65.1 m/s
MVSPHT: 126 ms
PW: 12.8 mm — AB (ref 0.6–1.1)
TAPSE: 19.4 mm
WEIGHTICAEL: 2649.6 [oz_av]

## 2017-02-26 LAB — NM MYOCAR MULTI W/SPECT W/WALL MOTION / EF
Estimated workload: 1 METS
Exercise duration (min): 0 min
Exercise duration (sec): 0 s
MPHR: 148 {beats}/min
Peak HR: 92 {beats}/min
Percent HR: 62 %
Rest HR: 55 {beats}/min

## 2017-02-26 MED ORDER — TECHNETIUM TC 99M TETROFOSMIN IV KIT
10.0000 | PACK | Freq: Once | INTRAVENOUS | Status: AC | PRN
  Administered 2017-02-26: 10 via INTRAVENOUS

## 2017-02-26 MED ORDER — REGADENOSON 0.4 MG/5ML IV SOLN
INTRAVENOUS | Status: AC
  Filled 2017-02-26: qty 5

## 2017-02-26 MED ORDER — REGADENOSON 0.4 MG/5ML IV SOLN
0.4000 mg | Freq: Once | INTRAVENOUS | Status: AC
  Administered 2017-02-26: 0.4 mg via INTRAVENOUS

## 2017-02-26 MED ORDER — TECHNETIUM TC 99M TETROFOSMIN IV KIT
30.0000 | PACK | Freq: Once | INTRAVENOUS | Status: AC | PRN
  Administered 2017-02-26: 30 via INTRAVENOUS

## 2017-02-26 MED ORDER — AMLODIPINE BESYLATE 5 MG PO TABS: 5.0000 mg | ORAL_TABLET | Freq: Every day | ORAL | 0 refills | Status: DC

## 2017-02-26 NOTE — Progress Notes (Signed)
Pt got discharged to home, discharge instructions provided and patient showed understanding to it, IV taken out,Telemonitor DC,pt left unit in wheelchair with all of the belongings accompanied with a family member (Friend)  Imajean Mcdermid, RN 

## 2017-02-26 NOTE — Progress Notes (Addendum)
PROGRESS NOTE    [name]  GYI:[id number] DOB: 11-May-1945 DOA: 02/24/2017 PCP: Harlan Stains, MD   Chief Complaint  Patient presents with  . Chest Pain  . Hypertension    Brief Narrative:  HPI On 02/24/2017 by Dr. Laurey Arrow [name] is a 72 y.o. female with medical history significant for mi/cad in 2000 with stent placed, htn, bipolar disorder, copd, former smoking, who presents with 1 day of substernal chest tightness/discomfort and one day of dyspnea on exertion. Last time had pain like this was in 2000 when had MI. Is not painful, but uncomfortable. Happens at rest, is not exertional. But does have dyspnea with ambulation, not at rest. Pain does not radiate. No cough or increased sputum production. No hemoptysis or fever. Normal appetite, no n/v/d. No leg swelling. Sleeps sitting up because of back pain. Denies history of venous thrombus. No recent med changes. Is compliant w/ meds but hasn't had evening meds today.  Interim history Admitted for CHF exacerbation and chest pain.  Cardiology consulted and appreciated. Assessment & Plan   Chest pain -Patient with history of coronary artery disease and PCI -Troponin cycled and unremarkable  -echocardiogram pending   -Lexiscan showing high risk, EF of 51%, fixed defect in the inferior lateral wall, likely old infarct.  Mild peri-infarct ischemia in the adjacent lateral wall. -Pending further recommendations from cardiology- will review lexiscan   Possible congestive heart failure -Echocardiogram pending -EF on Lexiscan was 51% -BNP on admission 274.9 -Chest x-ray reviewed and unremarkable -Continue IV Lasix, monitor intake and output, daily weights  COPD -currently stable, not in exacerbation  Hypertension -Continue amlodipine, clonidine, Lasix, Avapro  DVT Prophylaxis  heparin  Code Status: DNR  Family Communication: Friend at bedside  Disposition Plan: Admitted  Consultants None  Procedures  lexiscan     Antibiotics   Anti-infectives (From admission, onward)   None      Subjective:   [name] seen and examined today.  Denies further chest pain. Had chest pain earlier with stress test. Denies shortness of breath, abdominal pain, N/V/D/C.   Objective:   Vitals:   02/26/17 1018 02/26/17 1020 02/26/17 1149 02/26/17 1152  BP: (!) 211/107 (!) 210/104 (!) 143/89 (!) 143/89  Pulse:    89  Resp:    20  Temp:    (!) 97.5 F (36.4 C)  TempSrc:    Oral  SpO2:    95%  Weight:      Height:        Intake/Output Summary (Last 24 hours) at 02/26/2017 1237 Last data filed at 02/26/2017 3500 Gross per 24 hour  Intake 360 ml  Output 650 ml  Net -290 ml   Filed Weights   02/24/17 1758 02/25/17 1618 02/26/17 0440  Weight: 78 kg (172 lb) 75.2 kg (165 lb 12.6 oz) 75.1 kg (165 lb 9.6 oz)    Exam  General: Well developed, well nourished, NAD, appears stated age  HEENT: NCAT, mucous membranes moist.   Cardiovascular: S1 S2 auscultated, RRR, no murmurs.  Respiratory: Clear to auscultation bilaterally with equal chest rise  Abdomen: Soft, nontender, nondistended, + bowel sounds  Extremities: warm dry without cyanosis clubbing or edema  Neuro: AAOx3, nonfocal  Psych: Appropriate and pleasant   Data Reviewed: I have personally reviewed following labs and imaging studies  CBC: Recent Labs  Lab 02/24/17 1902 02/24/17 2339  WBC 5.7 5.9  NEUTROABS 3.4  --   HGB 14.1 15.2*  HCT 39.3 42.3  MCV 91.0 91.6  PLT 257 409   Basic Metabolic Panel: Recent Labs  Lab 02/24/17 1902 02/24/17 2339 02/25/17 0239 02/25/17 0700  NA 134*  --  135 135  K 3.9  --  3.4* 3.9  CL 98*  --  103 100*  CO2 21*  --  17* 23  GLUCOSE 93  --  92 107*  BUN 8  --  5* 7  CREATININE 0.83 0.91 0.73 0.88  CALCIUM 9.8  --  8.5* 9.7   GFR: Estimated Creatinine Clearance: 59.8 mL/min (by C-G formula based on SCr of 0.88 mg/dL). Liver Function Tests: No results for input(s): AST, ALT, ALKPHOS,  BILITOT, PROT, ALBUMIN in the last 168 hours. No results for input(s): LIPASE, AMYLASE in the last 168 hours. No results for input(s): AMMONIA in the last 168 hours. Coagulation Profile: No results for input(s): INR, PROTIME in the last 168 hours. Cardiac Enzymes: Recent Labs  Lab 02/24/17 1902 02/25/17 0200 02/25/17 0700 02/25/17 1652  TROPONINI <0.03 <0.03 <0.03 <0.03   BNP (last 3 results) No results for input(s): PROBNP in the last 8760 hours. HbA1C: Recent Labs    02/24/17 2339  HGBA1C 5.9*   CBG: No results for input(s): GLUCAP in the last 168 hours. Lipid Profile: No results for input(s): CHOL, HDL, LDLCALC, TRIG, CHOLHDL, LDLDIRECT in the last 72 hours. Thyroid Function Tests: No results for input(s): TSH, T4TOTAL, FREET4, T3FREE, THYROIDAB in the last 72 hours. Anemia Panel: No results for input(s): VITAMINB12, FOLATE, FERRITIN, TIBC, IRON, RETICCTPCT in the last 72 hours. Urine analysis:    Component Value Date/Time   COLORURINE YELLOW 10/10/2015 [place] 10/10/2015 0833   LABSPEC 1.012 10/10/2015 0833   PHURINE 6.5 10/10/2015 0833   GLUCOSEU NEGATIVE 10/10/2015 0833   HGBUR NEGATIVE 10/10/2015 0833   BILIRUBINUR NEGATIVE 10/10/2015 0833   KETONESUR NEGATIVE 10/10/2015 0833   PROTEINUR NEGATIVE 10/10/2015 0833   UROBILINOGEN 0.2 03/07/2012 1234   NITRITE NEGATIVE 10/10/2015 0833   LEUKOCYTESUR NEGATIVE 10/10/2015 0833   Sepsis Labs: @LABRCNTIP (procalcitonin:4,lacticidven:4)  )No results found for this or any previous visit (from the past 240 hour(s)).    Radiology Studies: Dg Chest 2 View  Result Date: 02/24/2017 CLINICAL DATA:  Chest pressure. EXAM: CHEST  2 VIEW COMPARISON:  August 04, 2016 FINDINGS: Mild cardiomegaly. Stable torturous thoracic aorta. The hila and mediastinum are otherwise normal. No pulmonary nodules, masses, or focal infiltrates. There is a compression fracture of a lower thoracic vertebral body, similar in appearance  since February 19, 2014. No other acute abnormalities. IMPRESSION: No active cardiopulmonary disease. Electronically Signed   By: Dorise Bullion III M.D   On: 02/24/2017 20:08   Ct Angio Chest Pe W/cm &/or Wo Cm  Result Date: 02/24/2017 CLINICAL DATA:  Intermittent chest pain and shortness of breath for 1 week. EXAM: CT ANGIOGRAPHY CHEST WITH CONTRAST TECHNIQUE: Multidetector CT imaging of the chest was performed using the standard protocol during bolus administration of intravenous contrast. Multiplanar CT image reconstructions and MIPs were obtained to evaluate the vascular anatomy. CONTRAST:  181mL ISOVUE-370 IOPAMIDOL (ISOVUE-370) INJECTION 76% COMPARISON:  None. FINDINGS: Cardiovascular: Heart is mildly enlarged. Coronary artery calcifications are present. No significant pericardial effusion is present. Vascular calcifications are present at the aortic arch and origins of the great vessels. Pulmonary artery opacification is excellent. No focal filling defects are present to suggest pulmonary emboli. Mediastinum/Nodes: Paratracheal lymph nodes measure up to 8 mm. No significant hilar or axillary adenopathy is present. Lungs/Pleura: Centrilobular  emphysematous changes are present. Superimposed ground-glass attenuation is noted. There is mild dependent atelectasis bilaterally without significant consolidation. Upper Abdomen: Extensive vascular calcifications are present. Visualized liver, spleen, pancreas, kidneys are unremarkable. Musculoskeletal: Remote T12 compression fracture is associated with focal kyphosis. Vertebral body heights and alignment are otherwise normal. No focal lytic or blastic lesions are present. Soft tissue densities are present in the breasts bilaterally. Review of the MIP images confirms the above findings. IMPRESSION: 1. Cardiomegaly with mild ground-glass attenuation superimposed on emphysema compatible with edema and congestive heart failure. 2. Mild bibasilar atelectasis. 3. No  pulmonary embolus. 4.  Aortic Atherosclerosis (ICD10-I70.0). 5.  Emphysema (ICD10-J43.9). 6. Coronary artery disease. 7. Remote T12 compression fracture with associated kyphosis. 8. Asymmetric soft tissue is again noted in the breasts bilaterally. Previous mammogram demonstrated this to be within normal limits. Electronically Signed   By: San Morelle M.D.   On: 02/24/2017 21:22   Nm Myocar Multi W/spect W/wall Motion / Ef  Result Date: 02/26/2017 CLINICAL DATA:  Chest pain, acute coronary syndrome EXAM: MYOCARDIAL IMAGING WITH SPECT (REST AND PHARMACOLOGIC-STRESS) GATED LEFT VENTRICULAR WALL MOTION STUDY LEFT VENTRICULAR EJECTION FRACTION TECHNIQUE: Standard myocardial SPECT imaging was performed after resting intravenous injection of 10 mCi Tc-53m tetrofosmin. Subsequently, intravenous infusion of Lexiscan was performed under the supervision of the Cardiology staff. At peak effect of the drug, 30 mCi Tc-53m tetrofosmin was injected intravenously and standard myocardial SPECT imaging was performed. Quantitative gated imaging was also performed to evaluate left ventricular wall motion, and estimate left ventricular ejection fraction. COMPARISON:  None. FINDINGS: Perfusion: Decreased activity is noted in the inferolateral wall on both rest and stress images as well as at the apex. Area of reversibility noted in the lateral wall adjacent to the inferolateral fixed defect concerning for area of peri-infarct ischemia. Wall Motion: Decreased wall motion and thickening in the inferolateral wall Left Ventricular Ejection Fraction: 51 % End diastolic volume 93 ml End systolic volume 45 ml IMPRESSION: 1. Fixed defect in the inferolateral wall, likely old infarct. There appears to be some mild peri-infarct ischemia in the adjacent lateral wall. 2. Decreased wall motion and thickening in the inferolateral wall. 3. Left ventricular ejection fraction 51% 4. Non invasive risk stratification*: High *2012 Appropriate  Use Criteria for Coronary Revascularization Focused Update: J Am Coll Cardiol. 0347;42(5):956-387. http://[email or web address] Electronically Signed   By: Rolm Baptise M.D.   On: 02/26/2017 11:29     Scheduled Meds: . amLODipine  5 mg Oral Daily  . ARIPiprazole  20 mg Oral BH-q7a  . aspirin EC  81 mg Oral q morning - 10a  . calcium-vitamin D  1 tablet Oral BID  . cholecalciferol  2,000 Units Oral BID  . cloNIDine  0.2 mg Oral BID  . cycloSPORINE  1 drop Both Eyes BID  . docusate sodium  100 mg Oral Daily  . furosemide  20 mg Intravenous Daily  . heparin injection (subcutaneous)  5,000 Units Subcutaneous Q8H  . irbesartan  300 mg Oral Daily  . lamoTRIgine  200 mg Oral BID  . mometasone-formoterol  2 puff Inhalation BID  . pantoprazole  40 mg Oral Daily  . regadenoson      . sertraline  50 mg Oral q morning - 10a  . tiotropium  18 mcg Inhalation QPM   Continuous Infusions:   LOS: 2 days   Time Spent in minutes   30 minutes  [name] D.O. on 02/26/2017 at 12:37 PM  Between 7am to 7pm - Pager -  [phone]  After 7pm go to [email or web address] - password [email or web address]  And look for the night coverage person covering for me after hours  [facility] Office  [phone]

## 2017-02-26 NOTE — Progress Notes (Addendum)
1 day lexiscan myoview completed, result below, will discuss with cardiology on call physician  IMPRESSION: 1. Fixed defect in the inferolateral wall, likely old infarct. There appears to be some mild peri-infarct ischemia in the adjacent lateral wall.  2. Decreased wall motion and thickening in the inferolateral wall.  3. Left ventricular ejection fraction 51%  4. Non invasive risk stratification*: High   Hilbert Corrigan PA Pager: [phone]   Image reviewed with our reader Dr. Johnsie Cancel who felt it is low risk. She has known inferior MI in 2000 which likely result in the scar. Given negative serial enzyme, we plan to followup with the patient closely as outpatient in 1-2 weeks after discharge.   Hilbert Corrigan PA Pager: [phone]

## 2017-02-26 NOTE — Progress Notes (Signed)
  Echocardiogram 2D Echocardiogram has been performed.  Randa Lynn Vondra Aldredge 02/26/2017, 3:38 PM

## 2017-02-26 NOTE — Discharge Summary (Signed)
Physician Discharge Summary  [name] JSE:[id number] DOB: 30-Sep-1945 DOA: 02/24/2017  PCP: Harlan Stains, MD  Admit date: 02/24/2017 Discharge date: 02/26/2017  Time spent: 45 minutes  Recommendations for Outpatient Follow-up:  Patient will be discharged to home.  Patient will need to follow up with primary care provider within one week of discharge.  Follow-up with cardiology in 1-2 weeks.  Patient should continue medications as prescribed.  Patient should follow a hear healthy diet.    Discharge Diagnoses:  Chest pain Possible diastolic congestive heart failure COPD Essential Hypertension  Discharge Condition: Stable  Diet recommendation: heart healthy  Filed Weights   02/24/17 1758 02/25/17 1618 02/26/17 0440  Weight: 78 kg (172 lb) 75.2 kg (165 lb 12.6 oz) 75.1 kg (165 lb 9.6 oz)    History of present illness:  On 02/24/2017 by Dr. Scarlette Shorts D Cockmanis a 72 y.o.femalewith medical history significant formi/cad in 2000 with stent placed, htn, bipolar disorder, copd, former smoking, who presents with 1 day of substernal chest tightness/discomfort and one day of dyspnea on exertion. Last time had pain like this was in 2000 when had MI. Is not painful, but uncomfortable. Happens at rest, is not exertional. But does have dyspnea with ambulation, not at rest. Pain does not radiate. No cough or increased sputum production. No hemoptysis or fever. Normal appetite, no n/v/d. No leg swelling. Sleeps sitting up because of back pain. Denies history of venous thrombus. No recent med changes. Is compliant w/ meds but hasn't had evening meds today.  Hospital Course:  Chest pain -Patient with history of coronary artery disease and PCI -Troponin cycled and unremarkable  -echocardiogram pending   -Lexiscan showing high risk, EF of 51%, fixed defect in the inferior lateral wall, likely old infarct.  Mild peri-infarct ischemia in the adjacent lateral wall. -Discussed with  cardiology, images reviewed by Dr. Johnsie Cancel, who felt this to be low risk. Patient has a known inferior MI from 2000, therefore likely a scar. Cardiology will follow up with patient in 1-2 weeks.   Possible congestive heart failure -Echocardiogram pending -EF on Lexiscan was 51% -BNP on admission 274.9 -Chest x-ray reviewed and unremarkable -Continue IV Lasix, monitor intake and output, daily weights -CTA chest negative for PE, mild CHF  COPD -currently stable, not in exacerbation  Essential Hypertension -Continue amlodipine, clonidine, Lasix, benicar  T12 compression fracture -Noted on CTA chest: remote, with associated kyphosis -continue outpatient follow up  Procedures: [place]  Consultations: Cardiology, via phone  Discharge Exam: Vitals:   02/26/17 1149 02/26/17 1152  BP: (!) 143/89 (!) 143/89  Pulse:  89  Resp:  20  Temp:  (!) 97.5 F (36.4 C)  SpO2:  95%   Patient no longer complaining of chest pain or shortness of breath.  Did have chest pain earlier with stress test.  Currently denies abdominal pain, nausea or vomiting, diarrhea or constipation, dizziness or headache.   General: Well developed, well nourished, NAD, appears stated age  HEENT: NCAT,  mucous membranes moist.  Cardiovascular: S1 S2 auscultated, no rubs, murmurs or gallops. Regular rate and rhythm.  Respiratory: Clear to auscultation bilaterally with equal chest rise  Abdomen: Soft, nontender, nondistended, + bowel sounds  Extremities: warm dry without cyanosis clubbing or edema  Neuro: AAOx3, nonfocal  Psych: Normal affect and demeanor with intact judgement and insight  Discharge Instructions Discharge Instructions    Discharge instructions   Complete by:  As directed    Patient will be discharged to home.  Patient will need to follow up with primary care provider within one week of discharge.  Follow-up with cardiology in 1-2 weeks.  Patient should continue medications as  prescribed.  Patient should follow a hear healthy diet.     Allergies as of 02/26/2017   No Known Allergies     Medication List    TAKE these medications   albuterol 108 (90 Base) MCG/ACT inhaler Commonly known as:  PROVENTIL HFA;VENTOLIN HFA Inhale 2 puffs into the lungs every 6 (six) hours as needed for wheezing.   amLODipine 5 MG tablet Commonly known as:  NORVASC Take 1 tablet (5 mg total) by mouth daily. Start taking on:  02/27/2017 What changed:    medication strength  how much to take   ARIPiprazole 20 MG tablet Commonly known as:  ABILIFY Take 20 mg by mouth every morning.   aspirin EC 81 MG tablet Take 81 mg by mouth every morning.   Calcium Carbonate-Vitamin D 600-400 MG-UNIT tablet Take 1 tablet by mouth 2 (two) times daily.   cloNIDine 0.2 MG tablet Commonly known as:  CATAPRES Take 0.2 mg by mouth 2 (two) times daily.   cycloSPORINE 0.05 % ophthalmic emulsion Commonly known as:  RESTASIS Place 1 drop into both eyes 2 (two) times daily.   docusate sodium 100 MG capsule Commonly known as:  COLACE Take 1 capsule (100 mg total) by mouth 2 (two) times daily. What changed:  when to take this   esomeprazole 20 MG capsule Commonly known as:  NEXIUM Take 20 mg by mouth daily before breakfast.   furosemide 20 MG tablet Commonly known as:  LASIX Take 20 mg by mouth every morning.   lamoTRIgine 100 MG tablet Commonly known as:  LAMICTAL Take 200 mg by mouth 2 (two) times daily.   mometasone-formoterol 100-5 MCG/ACT Aero Commonly known as:  DULERA Inhale 2 puffs into the lungs 2 (two) times daily.   multivitamin capsule Take 1 capsule by mouth every morning.   nitroGLYCERIN 0.4 MG SL tablet Commonly known as:  NITROSTAT Place 1 tablet (0.4 mg total) under the tongue every 5 (five) minutes as needed for chest pain.   olmesartan 40 MG tablet Commonly known as:  BENICAR Take 40 mg by mouth every morning.   oxyCODONE-acetaminophen 10-325 MG  tablet Commonly known as:  PERCOCET Take 1 tablet by mouth every 4 (four) hours.   polyethylene glycol packet Commonly known as:  MIRALAX / GLYCOLAX Take 17 g by mouth daily as needed for mild constipation or moderate constipation.   potassium chloride 8 MEQ tablet Commonly known as:  KLOR-CON Take 8 mEq by mouth every morning.   rosuvastatin 5 MG tablet Commonly known as:  CRESTOR Take 5 mg by mouth at bedtime.   sertraline 50 MG tablet Commonly known as:  ZOLOFT Take 50 mg by mouth every morning.   tiotropium 18 MCG inhalation capsule Commonly known as:  SPIRIVA Place 18 mcg into inhaler and inhale every evening.   Vitamin D3 2000 units Tabs Take 2,000 Units by mouth 2 (two) times daily.      No Known Allergies Follow-up Information    [place], Peter M, MD Follow up.   Specialty:  Cardiology Why:  please cancel next Tue's office visit and arrange 2 weeks followup with Dr. Doug Sou PA or NP instead. Staff message has also been sent to our scheduler who will contact you to make above changes as well.  Contact information: [place]  [id number] [phone]            The results of significant diagnostics from this hospitalization (including imaging, microbiology, ancillary and laboratory) are listed below for reference.    Significant Diagnostic Studies: Dg Chest 2 View  Result Date: 02/24/2017 CLINICAL DATA:  Chest pressure. EXAM: CHEST  2 VIEW COMPARISON:  August 04, 2016 FINDINGS: Mild cardiomegaly. Stable torturous thoracic aorta. The hila and mediastinum are otherwise normal. No pulmonary nodules, masses, or focal infiltrates. There is a compression fracture of a lower thoracic vertebral body, similar in appearance since February 19, 2014. No other acute abnormalities. IMPRESSION: No active cardiopulmonary disease. Electronically Signed   By: Dorise Bullion III M.D   On: 02/24/2017 20:08   Ct Angio Chest Pe W/cm &/or Wo Cm  Result  Date: 02/24/2017 CLINICAL DATA:  Intermittent chest pain and shortness of breath for 1 week. EXAM: CT ANGIOGRAPHY CHEST WITH CONTRAST TECHNIQUE: Multidetector CT imaging of the chest was performed using the standard protocol during bolus administration of intravenous contrast. Multiplanar CT image reconstructions and MIPs were obtained to evaluate the vascular anatomy. CONTRAST:  19mL ISOVUE-370 IOPAMIDOL (ISOVUE-370) INJECTION 76% COMPARISON:  None. FINDINGS: Cardiovascular: Heart is mildly enlarged. Coronary artery calcifications are present. No significant pericardial effusion is present. Vascular calcifications are present at the aortic arch and origins of the great vessels. Pulmonary artery opacification is excellent. No focal filling defects are present to suggest pulmonary emboli. Mediastinum/Nodes: Paratracheal lymph nodes measure up to 8 mm. No significant hilar or axillary adenopathy is present. Lungs/Pleura: Centrilobular emphysematous changes are present. Superimposed ground-glass attenuation is noted. There is mild dependent atelectasis bilaterally without significant consolidation. Upper Abdomen: Extensive vascular calcifications are present. Visualized liver, spleen, pancreas, kidneys are unremarkable. Musculoskeletal: Remote T12 compression fracture is associated with focal kyphosis. Vertebral body heights and alignment are otherwise normal. No focal lytic or blastic lesions are present. Soft tissue densities are present in the breasts bilaterally. Review of the MIP images confirms the above findings. IMPRESSION: 1. Cardiomegaly with mild ground-glass attenuation superimposed on emphysema compatible with edema and congestive heart failure. 2. Mild bibasilar atelectasis. 3. No pulmonary embolus. 4.  Aortic Atherosclerosis (ICD10-I70.0). 5.  Emphysema (ICD10-J43.9). 6. Coronary artery disease. 7. Remote T12 compression fracture with associated kyphosis. 8. Asymmetric soft tissue is again noted in the  breasts bilaterally. Previous mammogram demonstrated this to be within normal limits. Electronically Signed   By: San Morelle M.D.   On: 02/24/2017 21:22   Nm Myocar Multi W/spect W/wall Motion / Ef  Result Date: 02/26/2017 CLINICAL DATA:  Chest pain, acute coronary syndrome EXAM: MYOCARDIAL IMAGING WITH SPECT (REST AND PHARMACOLOGIC-STRESS) GATED LEFT VENTRICULAR WALL MOTION STUDY LEFT VENTRICULAR EJECTION FRACTION TECHNIQUE: Standard myocardial SPECT imaging was performed after resting intravenous injection of 10 mCi Tc-17m tetrofosmin. Subsequently, intravenous infusion of Lexiscan was performed under the supervision of the Cardiology staff. At peak effect of the drug, 30 mCi Tc-68m tetrofosmin was injected intravenously and standard myocardial SPECT imaging was performed. Quantitative gated imaging was also performed to evaluate left ventricular wall motion, and estimate left ventricular ejection fraction. COMPARISON:  None. FINDINGS: Perfusion: Decreased activity is noted in the inferolateral wall on both rest and stress images as well as at the apex. Area of reversibility noted in the lateral wall adjacent to the inferolateral fixed defect concerning for area of peri-infarct ischemia. Wall Motion: Decreased wall motion and thickening in the inferolateral wall Left Ventricular Ejection Fraction: 51 % End diastolic volume 93 ml End systolic  volume 45 ml IMPRESSION: 1. Fixed defect in the inferolateral wall, likely old infarct. There appears to be some mild peri-infarct ischemia in the adjacent lateral wall. 2. Decreased wall motion and thickening in the inferolateral wall. 3. Left ventricular ejection fraction 51% 4. Non invasive risk stratification*: High *2012 Appropriate Use Criteria for Coronary Revascularization Focused Update: J Am Coll Cardiol. 0272;53(6):644-034. http://content.airportbarriers.com.aspx?articleid=[email or web address] Electronically Signed   By: Rolm Baptise M.D.   On: 02/26/2017 11:29    US Arterial Seg Multiple Le (abi, Segmental Pressures, Pvr's)  Result Date: 02/22/2017 CLINICAL DATA:  72 year old female with claudication EXAM: NONINVASIVE PHYSIOLOGIC VASCULAR STUDY OF BILATERAL LOWER EXTREMITIES TECHNIQUE: Non-invasive vascular evaluation of both lower extremities was performed at rest, including calculation of ankle-brachial indices, multiple segmental pressure evaluation, segmental Doppler and segmental pulse volume recording. COMPARISON:  None. FINDINGS: Right Lower Extremity Resting ABI:  0.81 Resting TBI: 0.54 Segmental Pressures: Significant pressure gradient between the high and low thigh cuff suggesting outflow (superficial femoral artery) disease. Great toe pressure: 86 mm Hg Arterial Waveforms: Triphasic arterial waveforms transitioned biphasic from the popliteal station down. PVRs: Absent calf augmentation suggesting femoral disease. Left Lower Extremity: Resting ABI: 0.69 Resting TBI: 0.51 Segmental Pressures: Significant pressure gradient between the high and low thigh cuff as well as the low thigh cuff and calf cuffs suggesting femoropopliteal disease. Great toe pressure: 82 mm Hg Arterial Waveforms: Triphasic arterial waveforms transition to biphasic from the popliteal station down. PVRs: Absent calf augmentation consistent with femoropopliteal disease. Other: Symmetric upper extremity pressures. Ankle Brachial index > 1.4 Non diagnostic secondary to incompressible vessel calcifications 1.0-1.4       Normal 0.9-0.99     Borderline PAD 0.8-0.89     Mild PAD 0.5-0.79     Moderate PAD < 0.5          Severe PAD Toe Brachial Index Normal     >0.65 Moderate  0.53-0.64 Severe     <0.23 Toe Pressures Absolute toe pressure >62mmHg sufficient for wound healing. Toe pressures <59mmHg = critical limb ischemia. IMPRESSION: 1. Resting right ankle brachial index of 0.81 consistent with moderate peripheral arterial disease which appears to localize to the runoff (superficial femoral)  distribution. 2. Resting left ankle brachial index of 0.69 consistent with moderate peripheral arterial disease which appears to localize to the runoff (femoropopliteal) distribution. Given the clinical history of claudication, recommend referral to Interventional Radiology or other endovascular specialist for further evaluation and management. Signed, Criselda Peaches, MD [facility] Electronically Signed   By: Jacqulynn Cadet M.D.   On: 02/22/2017 15:41    Microbiology: No results found for this or any previous visit (from the past 240 hour(s)).   Labs: Basic Metabolic Panel: Recent Labs  Lab 02/24/17 1902 02/24/17 2339 02/25/17 0239 02/25/17 0700  NA 134*  --  135 135  K 3.9  --  3.4* 3.9  CL 98*  --  103 100*  CO2 21*  --  17* 23  GLUCOSE 93  --  92 107*  BUN 8  --  5* 7  CREATININE 0.83 0.91 0.73 0.88  CALCIUM 9.8  --  8.5* 9.7   Liver Function Tests: No results for input(s): AST, ALT, ALKPHOS, BILITOT, PROT, ALBUMIN in the last 168 hours. No results for input(s): LIPASE, AMYLASE in the last 168 hours. No results for input(s): AMMONIA in the last 168 hours. CBC: Recent Labs  Lab 02/24/17 1902 02/24/17 2339  WBC 5.7 5.9  NEUTROABS 3.4  --  HGB 14.1 15.2*  HCT 39.3 42.3  MCV 91.0 91.6  PLT 257 295   Cardiac Enzymes: Recent Labs  Lab 02/24/17 1902 02/25/17 0200 02/25/17 0700 02/25/17 1652  TROPONINI <0.03 <0.03 <0.03 <0.03   BNP: BNP (last 3 results) Recent Labs    02/24/17 1902  BNP 274.9*    ProBNP (last 3 results) No results for input(s): PROBNP in the last 8760 hours.  CBG: No results for input(s): GLUCAP in the last 168 hours.     Signed:  Cristal Ford  [facility] 02/26/2017, 2:57 PM

## 2017-02-28 ENCOUNTER — Ambulatory Visit: Payer: Medicare Other | Admitting: Physician Assistant

## 2017-03-06 DIAGNOSIS — I10 Essential (primary) hypertension: Secondary | ICD-10-CM | POA: Diagnosis not present

## 2017-03-06 DIAGNOSIS — F319 Bipolar disorder, unspecified: Secondary | ICD-10-CM | POA: Diagnosis not present

## 2017-03-06 DIAGNOSIS — J449 Chronic obstructive pulmonary disease, unspecified: Secondary | ICD-10-CM | POA: Diagnosis not present

## 2017-03-06 DIAGNOSIS — I5023 Acute on chronic systolic (congestive) heart failure: Secondary | ICD-10-CM | POA: Diagnosis not present

## 2017-03-15 ENCOUNTER — Encounter: Payer: Self-pay | Admitting: Physician Assistant

## 2017-03-15 ENCOUNTER — Ambulatory Visit (INDEPENDENT_AMBULATORY_CARE_PROVIDER_SITE_OTHER): Payer: Medicare Other | Admitting: Physician Assistant

## 2017-03-15 VITALS — BP 136/78 | HR 58 | Ht 66.0 in | Wt 172.0 lb

## 2017-03-15 DIAGNOSIS — I739 Peripheral vascular disease, unspecified: Secondary | ICD-10-CM | POA: Diagnosis not present

## 2017-03-15 DIAGNOSIS — I6523 Occlusion and stenosis of bilateral carotid arteries: Secondary | ICD-10-CM

## 2017-03-15 DIAGNOSIS — I10 Essential (primary) hypertension: Secondary | ICD-10-CM

## 2017-03-15 DIAGNOSIS — E785 Hyperlipidemia, unspecified: Secondary | ICD-10-CM | POA: Diagnosis not present

## 2017-03-15 DIAGNOSIS — I2510 Atherosclerotic heart disease of native coronary artery without angina pectoris: Secondary | ICD-10-CM | POA: Diagnosis not present

## 2017-03-15 NOTE — Progress Notes (Signed)
Cardiology Office Note    Date:  03/17/2017   ID:  [name], DOB [birth year], MRN [id number]  PCP:  Harlan Stains, MD  Cardiologist:  Dr. Martinique   Chief Complaint  Patient presents with  . Follow-up    post hospital visit     History of Present Illness:  [name] is a 72 y.o. female with PMH of CAD s/p inferior MI with PCI to RCA 2000, carotid artery stenosis s/p R CEA 1995, renal CA s/p partial L nephrectomy 06/2012, HTN, HLD and COPD.  He had negative Myoview in February 2013.  Echocardiogram in June 2014 showed normal EF.  Carotid ultrasound in April 2017 showed 40-59% left ICA stenosis, less than 40% right ICA stenosis.  She was last seen by Dr. Martinique in June 2018.  More recently, patient presented to the hospital on 02/24/2017 with chest tightness.  She also complained of shortness of breath with minimal exertion.  Serial troponin was negative.  Patient eventually underwent Myoview on 02/26/2017 which showed EF 51%, fixed defect in inferolateral wall consistent with prior MI, there appears to be some mild peri-infarct ischemia in the adjacent lateral wall, overall considered a high risk study.  Cardiology was not directly consulted at that time.  Since I was the one who did the stress test, I reviewed the final report with our reader Dr. Johnsie Cancel who over read the study and felt it to be relatively low risk.  She has known inferior MI in 2000 which likely resulted in the scar that was seen on the Myoview.  She also had echocardiogram on 02/26/2017 which showed EF 45-50%, hypokinesis and a scarring of basal inferolateral and inferior myocardium consistent with infarction in the RCA, grade 1 DD.  Patient presents today for cardiology office visit.  She has not had any chest pain since her recent discharge.  I have reviewed the drop in the ejection fraction with Dr. Martinique and also with stress test as well.  We also feel the stress test is low risk in this case.  She will have her  carotid ultrasound in June.  She is also scheduled to see Dr. Oneida Alar given the recent abnormal ABI with worse blood flow on the left side.  She has been complaining of some weakness in the calf with ambulation.  If Dr. Oneida Alar require cardiac clearance to do any lower extremity arteriography, she is cleared to proceed.    Past Medical History:  Diagnosis Date  . Alcoholism ([place])    recovering since 2000  . Anxiety   . Arthritis BACK  . Bipolar disorder ([place])   . Blepharospasm LEFT EYE  . Blepharospasm    left  . Blood transfusion   . Cervical cancer ([place]) 12/28/11    vagina upper right bx==squamous cell ca  . CHF exacerbation ([place]) 02/24/2017  . Chronic back pain   . Coronary artery disease CARDIOLOGIST- DR Martinique--- LAST VISIT NOTE 09-07-2009  W/ CHART  . Depression   . Emphysema   . History of alcohol abuse RECOVERING SINCE 2000  . HTN (hypertension)   . Hypertension   . Idiopathic acute facial nerve palsy LEFT SIDE--  BOTOX THERAPY  . Inferior MI ([place]) 2000--  POST PTCA W/ STENT X1  . Left renal mass 03/02/12  . Normal cardiac stress test 2008-- PER DR Martinique NOTE 09-07-2009  . Osteoporosis   . Other and unspecified general anesthetics causing adverse effect in therapeutic use post op delirium--  last anes  record w/ chart  from   09-22-2009 (spinal w/ light sedation)  . Peripheral vascular disease ([place]) POST RIGHT CAROTID SURG.  1995  . PONV (postoperative nausea and vomiting)   . Renal cell carcinoma ([place]) 07/09/12   Left mass  . Rosacea LEFT FACIAL RASH  . S/P radiation therapy 02/21/2012   38.75 Gy HDR 5 Fractions- vaginal cuff  . Seizures ([place])    x 1 after abrupt discontinuation of  Clonidine  . Status post carotid endarterectomy RIGHT --  1995  . Status post primary angioplasty with coronary stent 2000--  POST INFERIOR MI  . Unstable balance WALKS W/ CANE  . Vaginal cancer [facility])     Past Surgical History:  Procedure Laterality Date  . CAROTID ENDARTERECTOMY  1995    RIGHT  . CATARACT EXTRACTION W/ INTRAOCULAR LENS  IMPLANT, BILATERAL    . CERVICAL CONIZATION W/BX  09-23-2008  . CORONARY ANGIOPLASTY WITH STENT PLACEMENT  2000-   INFERIOR MI   X1 STENT TO RCA  . EUS N/A 03/05/2012   Procedure: FULL UPPER ENDOSCOPIC ULTRASOUND (EUS) RADIAL and EGD;  Surgeon: Milus Banister, MD;  Location: [facility] END[facility];  Service: Endoscopy;  Laterality: N/A;  ercp scope first than eus scope  . HEMIARTHROPLASTY HIP  12-26-2008   LEFT FEMORAL NECK FX  . ORIF HIP FRACTURE  02-13-2007   RIGHT FEMORAL NECK FX  . ORIF RIGHT DISTAL RADIUS AND RIGHT PROXIMAL HUMEROUS NECK FX'S  10-10-2005  . RIGHT SHOULDER SURG.  2007  . ROBOTIC ASSITED PARTIAL NEPHRECTOMY Left 07/09/2012   Procedure: ROBOTIC ASSITED PARTIAL NEPHRECTOMY;  Surgeon: Dutch Gray, MD;  Location: [facility] OR[facility];  Service: Urology;  Laterality: Left;  . TOTAL HIP ARTHROPLASTY  04-15-2008   POST FAILED  RIGHT HIP ORIF FEMORAL FX  . TOTAL KNEE ARTHROPLASTY  09-22-2009   RIGHT  . UPPER RIGHT VAGINAL REGION  12/28/11   BIOPSY: SQUAMOUS CELL CARCINOMA  . VAGINAL HYSTERECTOMY  07-06-2009    Current Medications: Outpatient Medications Prior to Visit  Medication Sig Dispense Refill  . albuterol (PROVENTIL HFA;VENTOLIN HFA) 108 (90 BASE) MCG/ACT inhaler Inhale 2 puffs into the lungs every 6 (six) hours as needed for wheezing.    Marland Kitchen amLODipine (NORVASC) 5 MG tablet Take 1 tablet (5 mg total) by mouth daily. 30 tablet 0  . ARIPiprazole (ABILIFY) 20 MG tablet Take 20 mg by mouth every morning.    Marland Kitchen aspirin EC 81 MG tablet Take 81 mg by mouth every morning.     . Calcium Carbonate-Vitamin D 600-400 MG-UNIT per tablet Take 1 tablet by mouth 2 (two) times daily.     . Cholecalciferol (VITAMIN D3) 2000 UNITS TABS Take 2,000 Units by mouth 2 (two) times daily.     . cloNIDine (CATAPRES) 0.2 MG tablet Take 0.2 mg by mouth 2 (two) times daily.     . cycloSPORINE (RESTASIS) 0.05 % ophthalmic emulsion Place 1 drop into both eyes 2 (two) times  daily.    Marland Kitchen dexlansoprazole (DEXILANT) 60 MG capsule Take 60 mg by mouth daily.    Marland Kitchen docusate sodium (COLACE) 100 MG capsule Take 1 capsule (100 mg total) by mouth 2 (two) times daily. (Patient taking differently: Take 100 mg by mouth daily. ) 30 capsule 0  . furosemide (LASIX) 20 MG tablet Take 20 mg by mouth every morning.    . lamoTRIgine (LAMICTAL) 100 MG tablet Take 200 mg by mouth 2 (two) times daily.     Marland Kitchen losartan (COZAAR) 100 MG  tablet Take 100 mg by mouth daily.    . mometasone-formoterol (DULERA) 100-5 MCG/ACT AERO Inhale 2 puffs into the lungs 2 (two) times daily. 1 Inhaler 3  . Multiple Vitamin (MULTIVITAMIN) capsule Take 1 capsule by mouth every morning.     . nitroGLYCERIN (NITROSTAT) 0.4 MG SL tablet Place 1 tablet (0.4 mg total) under the tongue every 5 (five) minutes as needed for chest pain. 90 tablet 3  . oxyCODONE-acetaminophen (PERCOCET) 10-325 MG per tablet Take 1 tablet by mouth every 4 (four) hours.     . Pitavastatin Calcium (LIVALO) 1 MG TABS Take 1 tablet by mouth daily.    . polyethylene glycol (MIRALAX / GLYCOLAX) packet Take 17 g by mouth daily as needed for mild constipation or moderate constipation.     . potassium chloride (KLOR-CON) 8 MEQ tablet Take 8 mEq by mouth every morning.     . sertraline (ZOLOFT) 50 MG tablet Take 50 mg by mouth every morning.     . tiotropium (SPIRIVA) 18 MCG inhalation capsule Place 18 mcg into inhaler and inhale every evening.     Marland Kitchen esomeprazole (NEXIUM) 20 MG capsule Take 20 mg by mouth daily before breakfast.    . olmesartan (BENICAR) 40 MG tablet Take 40 mg by mouth every morning.    . rosuvastatin (CRESTOR) 5 MG tablet Take 5 mg by mouth at bedtime.     No facility-administered medications prior to visit.      Allergies:   Patient has no known allergies.   Social History   Socioeconomic History  . Marital status: Divorced    Spouse name: None  . Number of children: None  . Years of education: None  . Highest education  level: None  Social Needs  . Financial resource strain: None  . Food insecurity - worry: None  . Food insecurity - inability: None  . Transportation needs - medical: None  . Transportation needs - non-medical: None  Occupational History  . None  Tobacco Use  . Smoking status: Former Smoker    Packs/day: 2.00    Years: 50.00    Pack years: 100.00    Types: Cigarettes    Last attempt to quit: 01/18/2011    Years since quitting: 6.1  . Smokeless tobacco: Never Used  . Tobacco comment: STATES QUIT SMOKING 01-18-2011  Substance and Sexual Activity  . Alcohol use: No    Comment: RECOVERING ALCOHOLIC--   QUIT IN 9604  . Drug use: No  . Sexual activity: No  Other Topics Concern  . None  Social History Narrative  . None     Family History:  The patient's family history includes Hypertension in her father and mother.   ROS:   Please see the history of present illness.    ROS All other systems reviewed and are negative.   PHYSICAL EXAM:   VS:  BP 136/78   Pulse (!) 58   Ht 5\' 6"  (1.676 m)   Wt 172 lb (78 kg)   BMI 27.76 kg/m    GEN: Well nourished, well developed, in no acute distress  HEENT: normal  Neck: no JVD, carotid bruits, or masses Cardiac: RRR; no murmurs, rubs, or gallops,no edema  Respiratory:  clear to auscultation bilaterally, normal work of breathing GI: soft, nontender, nondistended, + BS MS: no deformity or atrophy  Skin: warm and dry, no rash Neuro:  Alert and Oriented x 3, Strength and sensation are intact Psych: euthymic mood, full affect  Wt Readings  from Last 3 Encounters:  03/15/17 172 lb (78 kg)  02/26/17 165 lb 9.6 oz (75.1 kg)  12/02/16 173 lb 9.6 oz (78.7 kg)      Studies/Labs Reviewed:   EKG:  EKG is not ordered today.    Recent Labs: 02/24/2017: B Natriuretic Peptide 274.9; Hemoglobin 15.2; Platelets 295 02/25/2017: BUN 7; Creatinine, Ser 0.88; Potassium 3.9; Sodium 135   Lipid Panel    Component Value Date/Time   CHOL  12/25/2008  0550    161        ATP III CLASSIFICATION:  <200     mg/dL   Desirable  200-239  mg/dL   Borderline High  >=240    mg/dL   High          TRIG 39 12/25/2008 0550   HDL 102 12/25/2008 0550   CHOLHDL 1.6 12/25/2008 0550   VLDL 8 12/25/2008 0550   LDLCALC  12/25/2008 0550    51        Total Cholesterol/HDL:CHD Risk Coronary Heart Disease Risk Table                     Men   Women  1/2 Average Risk   3.4   3.3  Average Risk       5.0   4.4  2 X Average Risk   9.6   7.1  3 X Average Risk  23.4   11.0        Use the calculated Patient Ratio above and the CHD Risk Table to determine the patient's CHD Risk.        ATP III CLASSIFICATION (LDL):  <100     mg/dL   Optimal  100-129  mg/dL   Near or Above                    Optimal  130-159  mg/dL   Borderline  160-189  mg/dL   High  >190     mg/dL   Very High    Additional studies/ records that were reviewed today include:   Myoview 02/26/2017 IMPRESSION: 1. Fixed defect in the inferolateral wall, likely old infarct. There appears to be some mild peri-infarct ischemia in the adjacent lateral wall.  2. Decreased wall motion and thickening in the inferolateral wall.  3. Left ventricular ejection fraction 51%  4. Non invasive risk stratification*: High     Echo 02/26/2017 LV EF: 45% -   50%  Study Conclusions  - Left ventricle: The cavity size was normal. There was mild   concentric hypertrophy. Systolic function was mildly reduced. The   estimated ejection fraction was in the range of 45% to 50%.   Hypokinesis and scarring of the basal-midinferolateral and   inferior myocardium; consistent with infarction in the   distribution of the right coronary or left circumflex coronary   artery. Doppler parameters are consistent with abnormal left   ventricular relaxation (grade 1 diastolic dysfunction). - Mitral valve: Calcified annulus.  ASSESSMENT:    1. Coronary artery disease involving native coronary artery of  native heart without angina pectoris   2. Bilateral carotid artery stenosis   3. Essential hypertension   4. Hyperlipidemia, unspecified hyperlipidemia type   5. PAD (peripheral artery disease) (HCC)      PLAN:  In order of problems listed above:  1. Coronary artery disease: Recent Myoview came back high risk, however this was over read by Dr. Johnsie Cancel in the hospital who felt this is  relatively low risk study.  Echocardiogram however showed mildly low EF.  I reviewed the Myoview and echocardiogram with Dr. Martinique, we would recommend medical therapy at this time given lack of recurrent chest discomfort.  2. PAD: She has upcoming visit with Dr. Oneida Alar of vascular surgery for abnormal ABI  3. Hypertension: Blood pressure well controlled.  4. Hyperlipidemia: On Livalo.  Lipid panel followed by primary care provider    Medication Adjustments/Labs and Tests Ordered: Current medicines are reviewed at length with the patient today.  Concerns regarding medicines are outlined above.  Medication changes, Labs and Tests ordered today are listed in the Patient Instructions below. Patient Instructions  Medication Instructions: Your physician recommends that you continue on your current medications as directed. Please refer to the Current Medication list given to you today.  If you need a refill on your cardiac medications before your next appointment, please call your pharmacy.   Follow-Up: Your physician wants you to keep your follow-up with Dr. Martinique on 07/11/17    Thank you for choosing Heartcare at [facility]!!         Signed, Almyra Deforest, [place]  03/17/2017 1:48 PM    [address], [place], [place]  [address] Phone: [phone]; Fax: [phone]

## 2017-03-15 NOTE — Patient Instructions (Addendum)
Medication Instructions: Your physician recommends that you continue on your current medications as directed. Please refer to the Current Medication list given to you today.  If you need a refill on your cardiac medications before your next appointment, please call your pharmacy.   Follow-Up: Your physician wants you to keep your follow-up with Dr. Martinique on 07/11/17    Thank you for choosing Heartcare at [facility]!!

## 2017-03-16 ENCOUNTER — Telehealth: Payer: Self-pay | Admitting: *Deleted

## 2017-03-16 NOTE — Telephone Encounter (Signed)
Returned the patient's call and scheduled her follow up appt. Explained to the patient that Dr. Alycia Rossetti is no longer seeing patients here in the [facility] office, patient ok to see new physician here.

## 2017-03-17 ENCOUNTER — Encounter: Payer: Self-pay | Admitting: Physician Assistant

## 2017-03-30 ENCOUNTER — Encounter: Payer: Self-pay | Admitting: Vascular Surgery

## 2017-03-30 ENCOUNTER — Ambulatory Visit (INDEPENDENT_AMBULATORY_CARE_PROVIDER_SITE_OTHER): Payer: Medicare Other | Admitting: Vascular Surgery

## 2017-03-30 VITALS — BP 146/89 | HR 66 | Temp 97.1°F | Resp 18 | Ht 66.0 in | Wt 172.0 lb

## 2017-03-30 DIAGNOSIS — I70213 Atherosclerosis of native arteries of extremities with intermittent claudication, bilateral legs: Secondary | ICD-10-CM | POA: Diagnosis not present

## 2017-03-30 DIAGNOSIS — I6523 Occlusion and stenosis of bilateral carotid arteries: Secondary | ICD-10-CM | POA: Diagnosis not present

## 2017-03-30 DIAGNOSIS — G518 Other disorders of facial nerve: Secondary | ICD-10-CM | POA: Diagnosis not present

## 2017-03-30 DIAGNOSIS — I739 Peripheral vascular disease, unspecified: Secondary | ICD-10-CM

## 2017-03-30 NOTE — Progress Notes (Addendum)
Requested by:  Harlan Stains, MD [address] [place], [place] [address]  Reason for consultation: BLE claudication   History of Present Illness   [name] is a 72 y.o. (March 17, 1945) female who presents with chief complaint: BLE claudication.  She complains of calf pain with ambulation right more than left after about a distance of 1/4 mile.  This has been progressing over the past couple years however this does not keep her from her day-to-day activities.  Prior to office visit ABIs were performed by an outside clinic and demonstrated right ABI 0.81 with TBI of 0.54 and left ABI of 0.69 with TBI of 0.51.  She denies rest pain or active tissue ischemia.  She is a former smoker who quit 10 years ago.  She is currently on a daily aspirin and statin regimen.  Past medical history also significant for CAD with prior coronary stenting, carotid artery disease with prior right CEA about 30 years ago followed by Dr. Martinique.  Patient also has been diagnosed with scoliosis and has persistent low back pain as well as occasional numbness and tingling of the bottom of her feet which has not been worked up or treated in the past.  Patient is also noted to have COPD which can also limit her walking distance.   Past Medical History:  Diagnosis Date  . Alcoholism ([place])    recovering since 2000  . Anxiety   . Arthritis BACK  . Bipolar disorder ([place])   . Blepharospasm LEFT EYE  . Blepharospasm    left  . Blood transfusion   . Cervical cancer ([place]) 12/28/11    vagina upper right bx==squamous cell ca  . CHF exacerbation ([place]) 02/24/2017  . Chronic back pain   . Coronary artery disease CARDIOLOGIST- DR Martinique--- LAST VISIT NOTE 09-07-2009  W/ CHART  . Depression   . Emphysema   . History of alcohol abuse RECOVERING SINCE 2000  . HTN (hypertension)   . Hypertension   . Idiopathic acute facial nerve palsy LEFT SIDE--  BOTOX THERAPY  . Inferior MI ([place]) 2000--  POST PTCA W/ STENT X1    . Left renal mass 03/02/12  . Normal cardiac stress test 2008-- PER DR Martinique NOTE 09-07-2009  . Osteoporosis   . Other and unspecified general anesthetics causing adverse effect in therapeutic use post op delirium--  last anes record w/ chart  from   09-22-2009 (spinal w/ light sedation)  . Peripheral vascular disease ([place]) POST RIGHT CAROTID SURG.  1995  . PONV (postoperative nausea and vomiting)   . Renal cell carcinoma ([place]) 07/09/12   Left mass  . Rosacea LEFT FACIAL RASH  . S/P radiation therapy 02/21/2012   38.75 Gy HDR 5 Fractions- vaginal cuff  . Seizures ([place])    x 1 after abrupt discontinuation of  Clonidine  . Status post carotid endarterectomy RIGHT --  1995  . Status post primary angioplasty with coronary stent 2000--  POST INFERIOR MI  . Unstable balance WALKS W/ CANE  . Vaginal cancer [facility])     Past Surgical History:  Procedure Laterality Date  . CAROTID ENDARTERECTOMY  1995   RIGHT  . CATARACT EXTRACTION W/ INTRAOCULAR LENS  IMPLANT, BILATERAL    . CERVICAL CONIZATION W/BX  09-23-2008  . CORONARY ANGIOPLASTY WITH STENT PLACEMENT  2000-   INFERIOR MI   X1 STENT TO RCA  . EUS N/A 03/05/2012   Procedure: FULL UPPER ENDOSCOPIC ULTRASOUND (EUS) RADIAL and EGD;  Surgeon: Milus Banister, MD;  Location: [facility];  Service: Endoscopy;  Laterality: N/A;  ercp scope first than eus scope  . HEMIARTHROPLASTY HIP  12-26-2008   LEFT FEMORAL NECK FX  . ORIF HIP FRACTURE  02-13-2007   RIGHT FEMORAL NECK FX  . ORIF RIGHT DISTAL RADIUS AND RIGHT PROXIMAL HUMEROUS NECK FX'S  10-10-2005  . RIGHT SHOULDER SURG.  2007  . ROBOTIC ASSITED PARTIAL NEPHRECTOMY Left 07/09/2012   Procedure: ROBOTIC ASSITED PARTIAL NEPHRECTOMY;  Surgeon: Dutch Gray, MD;  Location: [facility];  Service: Urology;  Laterality: Left;  . TOTAL HIP ARTHROPLASTY  04-15-2008   POST FAILED  RIGHT HIP ORIF FEMORAL FX  . TOTAL KNEE ARTHROPLASTY  09-22-2009   RIGHT  . UPPER RIGHT VAGINAL REGION  12/28/11   BIOPSY:  SQUAMOUS CELL CARCINOMA  . VAGINAL HYSTERECTOMY  07-06-2009    Social History   Socioeconomic History  . Marital status: Divorced    Spouse name: Not on file  . Number of children: Not on file  . Years of education: Not on file  . Highest education level: Not on file  Social Needs  . Financial resource strain: Not on file  . Food insecurity - worry: Not on file  . Food insecurity - inability: Not on file  . Transportation needs - medical: Not on file  . Transportation needs - non-medical: Not on file  Occupational History  . Not on file  Tobacco Use  . Smoking status: Former Smoker    Packs/day: 2.00    Years: 50.00    Pack years: 100.00    Types: Cigarettes    Last attempt to quit: 01/18/2011    Years since quitting: 6.2  . Smokeless tobacco: Never Used  . Tobacco comment: STATES QUIT SMOKING 01-18-2011  Substance and Sexual Activity  . Alcohol use: No    Comment: RECOVERING ALCOHOLIC--   QUIT IN 0932  . Drug use: No  . Sexual activity: No  Other Topics Concern  . Not on file  Social History Narrative  . Not on file    Family History  Problem Relation Age of Onset  . Hypertension Mother   . Hypertension Father     Current Outpatient Medications  Medication Sig Dispense Refill  . albuterol (PROVENTIL HFA;VENTOLIN HFA) 108 (90 BASE) MCG/ACT inhaler Inhale 2 puffs into the lungs every 6 (six) hours as needed for wheezing.    Marland Kitchen amLODipine (NORVASC) 5 MG tablet Take 1 tablet (5 mg total) by mouth daily. 30 tablet 0  . ARIPiprazole (ABILIFY) 20 MG tablet Take 20 mg by mouth every morning.    Marland Kitchen aspirin EC 81 MG tablet Take 81 mg by mouth every morning.     . Calcium Carbonate-Vitamin D 600-400 MG-UNIT per tablet Take 1 tablet by mouth 2 (two) times daily.     . Cholecalciferol (VITAMIN D3) 2000 UNITS TABS Take 2,000 Units by mouth 2 (two) times daily.     . cloNIDine (CATAPRES) 0.2 MG tablet Take 0.2 mg by mouth 2 (two) times daily.     . cycloSPORINE (RESTASIS) 0.05 %  ophthalmic emulsion Place 1 drop into both eyes 2 (two) times daily.    Marland Kitchen dexlansoprazole (DEXILANT) 60 MG capsule Take 60 mg by mouth daily.    Marland Kitchen docusate sodium (COLACE) 100 MG capsule Take 1 capsule (100 mg total) by mouth 2 (two) times daily. (Patient taking differently: Take 100 mg by mouth daily. ) 30 capsule 0  . furosemide (LASIX) 20  MG tablet Take 20 mg by mouth every morning.    . lamoTRIgine (LAMICTAL) 100 MG tablet Take 200 mg by mouth 2 (two) times daily.     Marland Kitchen losartan (COZAAR) 100 MG tablet Take 100 mg by mouth daily.    . mometasone-formoterol (DULERA) 100-5 MCG/ACT AERO Inhale 2 puffs into the lungs 2 (two) times daily. 1 Inhaler 3  . Multiple Vitamin (MULTIVITAMIN) capsule Take 1 capsule by mouth every morning.     . nitroGLYCERIN (NITROSTAT) 0.4 MG SL tablet Place 1 tablet (0.4 mg total) under the tongue every 5 (five) minutes as needed for chest pain. 90 tablet 3  . oxyCODONE-acetaminophen (PERCOCET) 10-325 MG per tablet Take 1 tablet by mouth every 4 (four) hours.     . Pitavastatin Calcium (LIVALO) 1 MG TABS Take 1 tablet by mouth daily.    . polyethylene glycol (MIRALAX / GLYCOLAX) packet Take 17 g by mouth daily as needed for mild constipation or moderate constipation.     . potassium chloride (KLOR-CON) 8 MEQ tablet Take 8 mEq by mouth every morning.     . sertraline (ZOLOFT) 50 MG tablet Take 50 mg by mouth every morning.     . tiotropium (SPIRIVA) 18 MCG inhalation capsule Place 18 mcg into inhaler and inhale every evening.      No current facility-administered medications for this visit.     No Known Allergies  REVIEW OF SYSTEMS (negative unless checked):   Cardiac:  []  Chest pain or chest pressure? []  Shortness of breath upon activity? []  Shortness of breath when lying flat? []  Irregular heart rhythm?  Vascular:  [x]  Pain in calf, thigh, or hip brought on by walking? []  Pain in feet at night that wakes you up from your sleep? []  Blood clot in your  veins? []  Leg swelling?  Pulmonary:  []  Oxygen at home? []  Productive cough? []  Wheezing?  Neurologic:  []  Sudden weakness in arms or legs? []  Sudden numbness in arms or legs? []  Sudden onset of difficult speaking or slurred speech? []  Temporary loss of vision in one eye? []  Problems with dizziness?  Gastrointestinal:  []  Blood in stool? []  Vomited blood?  Genitourinary:  []  Burning when urinating? []  Blood in urine?  Psychiatric:  []  Major depression  Hematologic:  []  Bleeding problems? []  Problems with blood clotting?  Dermatologic:  []  Rashes or ulcers?  Constitutional:  []  Fever or chills?  Ear/Nose/Throat:  []  Change in hearing? []  Nose bleeds? []  Sore throat?  Musculoskeletal:  []  Back pain? []  Joint pain? []  Muscle pain?   For VQI Use Only   PRE-ADM LIVING Home  AMB STATUS Ambulatory  CAD Sx History of MI, but no symptoms No MI within 6 months  PRIOR CHF Mild  STRESS TEST Considered low risk by Dr. Martinique if revascularization needed   Physical Examination     Vitals:   03/30/17 1300  BP: (!) 146/89  Pulse: 66  Resp: 18  Temp: (!) 97.1 F (36.2 C)  TempSrc: Oral  SpO2: 94%  Weight: 172 lb (78 kg)  Height: 5\' 6"  (1.676 m)   Body mass index is 27.76 kg/m.  General Alert, O x 3, WD, NAD  Head Sunol/AT,          Neck Supple, mid-line trachea,    Pulmonary Sym exp, good B air movt, Scattered adventitious sounds bilateral lung fields  Cardiac RRR, Nl S1, S2, no Murmurs, No rubs, No S3,S4  Vascular Vessel Right Left  Radial  Palpable Palpable  Brachial Palpable Palpable  Carotid Palpable, No Bruit Palpable, No Bruit  Aorta Not palpable N/A  Femoral Palpable Palpable  Popliteal Not palpable Not palpable  PT Not palpable Not palpable  DP Not palpable Not palpable    Gastro- intestinal soft, non-distended, non-tender to palpation,   Musculo- skeletal M/S 5/5 throughout  , Extremities without ischemic changes  , No edema present, No  visible varicosities , No Lipodermatosclerosis present  Neurologic Cranial nerves 2-12 intact   Psychiatric Judgement intact, Mood & affect   Dermatologic See M/S exam for extremity exam, No rashes otherwise noted  Lymphatic  Palpable lymph nodes: None    Non-Invasive Vascular imaging   BLE ABI (02/2017)  R:   ABI: .81  TBI:  0.54  L:   ABI: 0.69   TBI: 0.51   Outside Studies/Documentation    pages of outside documents were reviewed including: segmental pressures and ABIs.    Medical Decision Making   [name] is a 72 y.o. female who presents with: BLE intermittent claudication.   Mild bilateral lower extremity femoral popliteal disease causing claudication right greater than left leg  No rest pain or active tissue ischemia noted  Patient states her quality of life and day-to-day activities are unaffected  We will proceed conservatively with recommended walking program  Continue aspirin and statin regimen  Recheck ABIs in 1 year  Patient will return to office sooner if claudication worsens  She will follow-up as scheduled with Dr. Martinique for surveillance of carotid artery stenosis   Dagoberto Ligas, PA-C [facility] [place] Office: [phone]  03/30/2017, 1:27 PM   History and exam findings as above.  The patient does not have palpable pulses in either foot.  However she did have reasonable ABIs.  Right leg was 0.8 left was 0.7.  She most likely has superficial femoral artery occlusive disease.  She currently is able to walk about 1/4 mile without symptoms.  She states that her shortness of breath symptoms sometimes limit her more than her walking.  She is not interested in an intervention currently.  I reassured her that currently she has adequate perfusion that she is not at risk of limb loss.  I congratulated her on quitting smoking.  Primary treatment at this point would be risk factor modification for which she is already  on a statin and aspirin.  She will try to walk 30 minutes daily.  I discussed with her that this may increase her walking distance over time if she does this daily.  She will follow-up with [place] with repeat ABIs in 1 year.  She will return sooner if her claudication symptoms worsen or she wishes to consider an intervention.  She is currently under surveillance of Dr. Martinique for her carotid artery stenosis.  She will continue follow-up as needed per Dr. Martinique schedule.  Ruta Hinds, MD [facility] [place] Office: [phone] Pager: [phone]

## 2017-03-30 NOTE — Addendum Note (Signed)
Addended by: Elam Dutch on: 03/30/2017 01:59 PM   Modules accepted: Level of Service

## 2017-04-07 DIAGNOSIS — M5030 Other cervical disc degeneration, unspecified cervical region: Secondary | ICD-10-CM | POA: Diagnosis not present

## 2017-04-07 DIAGNOSIS — Z79891 Long term (current) use of opiate analgesic: Secondary | ICD-10-CM | POA: Diagnosis not present

## 2017-04-07 DIAGNOSIS — M545 Low back pain: Secondary | ICD-10-CM | POA: Diagnosis not present

## 2017-04-07 DIAGNOSIS — M5136 Other intervertebral disc degeneration, lumbar region: Secondary | ICD-10-CM | POA: Diagnosis not present

## 2017-04-17 DIAGNOSIS — H01004 Unspecified blepharitis left upper eyelid: Secondary | ICD-10-CM | POA: Diagnosis not present

## 2017-04-17 DIAGNOSIS — H04123 Dry eye syndrome of bilateral lacrimal glands: Secondary | ICD-10-CM | POA: Diagnosis not present

## 2017-04-17 DIAGNOSIS — H01002 Unspecified blepharitis right lower eyelid: Secondary | ICD-10-CM | POA: Diagnosis not present

## 2017-04-17 DIAGNOSIS — H01005 Unspecified blepharitis left lower eyelid: Secondary | ICD-10-CM | POA: Diagnosis not present

## 2017-04-17 DIAGNOSIS — H01001 Unspecified blepharitis right upper eyelid: Secondary | ICD-10-CM | POA: Diagnosis not present

## 2017-05-02 ENCOUNTER — Telehealth: Payer: Self-pay | Admitting: *Deleted

## 2017-05-02 NOTE — Telephone Encounter (Signed)
Called and moved the appt from May 13th to May 7th due to MD out of the office

## 2017-05-08 DIAGNOSIS — H01004 Unspecified blepharitis left upper eyelid: Secondary | ICD-10-CM | POA: Diagnosis not present

## 2017-05-08 DIAGNOSIS — H01001 Unspecified blepharitis right upper eyelid: Secondary | ICD-10-CM | POA: Diagnosis not present

## 2017-05-08 DIAGNOSIS — H04123 Dry eye syndrome of bilateral lacrimal glands: Secondary | ICD-10-CM | POA: Diagnosis not present

## 2017-05-08 DIAGNOSIS — H01002 Unspecified blepharitis right lower eyelid: Secondary | ICD-10-CM | POA: Diagnosis not present

## 2017-05-09 DIAGNOSIS — M5030 Other cervical disc degeneration, unspecified cervical region: Secondary | ICD-10-CM | POA: Diagnosis not present

## 2017-05-16 DIAGNOSIS — R61 Generalized hyperhidrosis: Secondary | ICD-10-CM | POA: Diagnosis not present

## 2017-05-16 DIAGNOSIS — I10 Essential (primary) hypertension: Secondary | ICD-10-CM | POA: Diagnosis not present

## 2017-05-16 DIAGNOSIS — F319 Bipolar disorder, unspecified: Secondary | ICD-10-CM | POA: Diagnosis not present

## 2017-05-16 DIAGNOSIS — E785 Hyperlipidemia, unspecified: Secondary | ICD-10-CM | POA: Diagnosis not present

## 2017-05-16 DIAGNOSIS — D692 Other nonthrombocytopenic purpura: Secondary | ICD-10-CM | POA: Diagnosis not present

## 2017-05-23 ENCOUNTER — Inpatient Hospital Stay: Payer: Medicare Other | Attending: Obstetrics | Admitting: Obstetrics

## 2017-05-23 ENCOUNTER — Other Ambulatory Visit (HOSPITAL_COMMUNITY)
Admission: RE | Admit: 2017-05-23 | Discharge: 2017-05-23 | Disposition: A | Discharge status: Home / Self Care | Payer: Medicare Other | Source: Ambulatory Visit | Attending: Obstetrics | Admitting: Obstetrics

## 2017-05-23 ENCOUNTER — Encounter: Payer: Self-pay | Admitting: Obstetrics

## 2017-05-23 ENCOUNTER — Inpatient Hospital Stay: Payer: Medicare Other

## 2017-05-23 VITALS — BP 124/61 | HR 63 | Temp 97.8°F | Resp 20 | Ht 66.0 in | Wt 173.8 lb

## 2017-05-23 DIAGNOSIS — Z90710 Acquired absence of both cervix and uterus: Secondary | ICD-10-CM | POA: Insufficient documentation

## 2017-05-23 DIAGNOSIS — C52 Malignant neoplasm of vagina: Secondary | ICD-10-CM | POA: Insufficient documentation

## 2017-05-23 DIAGNOSIS — R109 Unspecified abdominal pain: Secondary | ICD-10-CM

## 2017-05-23 DIAGNOSIS — Z87891 Personal history of nicotine dependence: Secondary | ICD-10-CM | POA: Diagnosis not present

## 2017-05-23 DIAGNOSIS — Z923 Personal history of irradiation: Secondary | ICD-10-CM | POA: Diagnosis not present

## 2017-05-23 DIAGNOSIS — Z85528 Personal history of other malignant neoplasm of kidney: Secondary | ICD-10-CM | POA: Diagnosis not present

## 2017-05-23 DIAGNOSIS — Z905 Acquired absence of kidney: Secondary | ICD-10-CM | POA: Diagnosis not present

## 2017-05-23 LAB — URINALYSIS, COMPLETE (UACMP) WITH MICROSCOPIC
Bacteria, UA: NONE SEEN
Bilirubin Urine: NEGATIVE
GLUCOSE, UA: NEGATIVE mg/dL
Hgb urine dipstick: NEGATIVE
Ketones, ur: NEGATIVE mg/dL
Leukocytes, UA: NEGATIVE
NITRITE: NEGATIVE
PROTEIN: NEGATIVE mg/dL
Specific Gravity, Urine: 1.012 (ref 1.005–1.030)
pH: 5 (ref 5.0–8.0)

## 2017-05-23 NOTE — Patient Instructions (Signed)
We will contact you with the results of your urine from today along with the results of your pap smear from today.  Plan to follow up in one year or sooner if needed.  Please call in November or December 2019 to schedule for May 2020.  Please give [place] a call for any needs, concerns, or questions.

## 2017-05-24 LAB — URINE CULTURE

## 2017-05-26 DIAGNOSIS — M542 Cervicalgia: Secondary | ICD-10-CM | POA: Diagnosis not present

## 2017-05-26 DIAGNOSIS — Z111 Encounter for screening for respiratory tuberculosis: Secondary | ICD-10-CM | POA: Diagnosis not present

## 2017-05-26 DIAGNOSIS — F319 Bipolar disorder, unspecified: Secondary | ICD-10-CM | POA: Diagnosis not present

## 2017-05-26 DIAGNOSIS — R21 Rash and other nonspecific skin eruption: Secondary | ICD-10-CM | POA: Diagnosis not present

## 2017-05-26 DIAGNOSIS — D692 Other nonthrombocytopenic purpura: Secondary | ICD-10-CM | POA: Diagnosis not present

## 2017-05-26 DIAGNOSIS — R61 Generalized hyperhidrosis: Secondary | ICD-10-CM | POA: Diagnosis not present

## 2017-05-26 LAB — CYTOLOGY - PAP: DIAGNOSIS: NEGATIVE

## 2017-05-29 ENCOUNTER — Ambulatory Visit: Payer: Medicare Other | Admitting: Obstetrics

## 2017-05-29 ENCOUNTER — Telehealth: Payer: Self-pay

## 2017-05-29 NOTE — Telephone Encounter (Addendum)
LM for Ms Entwistle to call back to Dr. Gerarda Fraction' office to discuss results of her PAP Smear and Urine Culture Will inform her that the PAP Smear was normal and no evidence of a urinary infection per [name], NP when she returns the call.

## 2017-05-29 NOTE — Telephone Encounter (Signed)
Pt called back and given results as noted below.

## 2017-06-01 DIAGNOSIS — L300 Nummular dermatitis: Secondary | ICD-10-CM | POA: Diagnosis not present

## 2017-06-06 ENCOUNTER — Encounter: Payer: Self-pay | Admitting: Cardiology

## 2017-06-12 ENCOUNTER — Encounter: Payer: Self-pay | Admitting: Obstetrics

## 2017-06-12 NOTE — Progress Notes (Signed)
Consult Note: Gyn-Onc  CC:  Chief Complaint  Patient presents with  . Vaginal cancer (HCC)    HPI:  [name] is a very pleasant 72 y.o. P0   Interval History: Since her last visit with Dr. Alycia Rossetti 06/01/2016 a Pap smear resulted from that visit which was negative.  The patient's main complaint today is flank pain.  She denies vaginal bleeding denies pelvic pain.  She is seen as an established patient but is a new patient to me today   Presenting History She had a 10 year history of abnormal Pap smears. In April 2011 she had a Pap smear revealing ASC-H. She was worked up and evaluated and subsequently underwent a vaginal hysterectomy in June of 2011. Pathology at that time revealed the cervix with high-grade squamous dysplasia. Focal adenomyosis. In addition, the ectocervical margin of the cervix was positive.   She was seen by Dr. Deatra Ina on November 14, 2009 at which time a Pap smear revealed VAIN2/VAIN 3. She underwent laser ablation on for dysplasia on February 07, 2010. Operative findings included a raised hyperkeratotic lesion in the vagina encompassing the entire vaginal cuff. The area was acetowhite changing. The total area measured approximately 4 x 4 centimeters.  Dr. Alycia Rossetti saw her in 2013 and her Pap smear was normal. She subsequently saw Dr. Deatra Ina November 16, 2011 and her Pap smear returned as ASCUS with positive high-risk HPV.   Dr. Alycia Rossetti saw her in late 2013 noting acetowhite epithelial changes at the top of the vagina. However, there was a punctate area with telangiectasias in the upper right vaginal fornix. A biopsy of this was performed. It revealed a squamous cell carcinoma.   She completed 5 fractions of HDR with Dr. Lanell Persons in 02/2012.   She was ultimately diagnosed with a renal cell carcinoma and is status post a partial left nephrectomy 07/09/2012. The pathology revealed a T1-1 grade 2 clear cell renal cell carcinoma. The margins were negative and she was  dispositioned to close followup.     Current Meds:  Outpatient Encounter Medications as of 05/23/2017  Medication Sig  . albuterol (PROVENTIL HFA;VENTOLIN HFA) 108 (90 BASE) MCG/ACT inhaler Inhale 2 puffs into the lungs every 6 (six) hours as needed for wheezing.  Marland Kitchen amLODipine (NORVASC) 5 MG tablet Take 1 tablet (5 mg total) by mouth daily.  . ARIPiprazole (ABILIFY) 20 MG tablet Take 10 mg by mouth every morning.   Marland Kitchen aspirin EC 81 MG tablet Take 81 mg by mouth every morning.   . Calcium Carbonate-Vitamin D 600-400 MG-UNIT per tablet Take 1 tablet by mouth 2 (two) times daily.   . Cholecalciferol (VITAMIN D3) 2000 UNITS TABS Take 2,000 Units by mouth 2 (two) times daily.   . cloNIDine (CATAPRES) 0.2 MG tablet Take 0.2 mg by mouth 2 (two) times daily.   . cycloSPORINE (RESTASIS) 0.05 % ophthalmic emulsion Place 1 drop into both eyes 2 (two) times daily.  Marland Kitchen dexlansoprazole (DEXILANT) 60 MG capsule Take 60 mg by mouth daily.  Marland Kitchen docusate sodium (COLACE) 100 MG capsule Take 1 capsule (100 mg total) by mouth 2 (two) times daily. (Patient taking differently: Take 100 mg by mouth daily. )  . furosemide (LASIX) 20 MG tablet Take 20 mg by mouth every morning.  . lamoTRIgine (LAMICTAL) 100 MG tablet Take 200 mg by mouth 2 (two) times daily.   Marland Kitchen losartan (COZAAR) 100 MG tablet Take 100 mg by mouth daily.  . mometasone-formoterol (DULERA) 100-5 MCG/ACT AERO Inhale 2  puffs into the lungs 2 (two) times daily.  . Multiple Vitamin (MULTIVITAMIN) capsule Take 1 capsule by mouth every morning.   . nitroGLYCERIN (NITROSTAT) 0.4 MG SL tablet Place 1 tablet (0.4 mg total) under the tongue every 5 (five) minutes as needed for chest pain.  Marland Kitchen oxyCODONE-acetaminophen (PERCOCET) 10-325 MG per tablet Take 1 tablet by mouth every 4 (four) hours.   . Pitavastatin Calcium (LIVALO) 1 MG TABS Take 1 tablet by mouth daily.  . polyethylene glycol (MIRALAX / GLYCOLAX) packet Take 17 g by mouth daily as needed for mild  constipation or moderate constipation.   . potassium chloride (KLOR-CON) 8 MEQ tablet Take 8 mEq by mouth every morning.   . sertraline (ZOLOFT) 50 MG tablet Take 50 mg by mouth every morning.   . tiotropium (SPIRIVA) 18 MCG inhalation capsule Place 18 mcg into inhaler and inhale every evening.    No facility-administered encounter medications on file as of 05/23/2017.     Allergy: No Known Allergies  Social Hx:   Social History   Socioeconomic History  . Marital status: Divorced    Spouse name: Not on file  . Number of children: Not on file  . Years of education: Not on file  . Highest education level: Not on file  Occupational History  . Not on file  Social Needs  . Financial resource strain: Not on file  . Food insecurity:    Worry: Not on file    Inability: Not on file  . Transportation needs:    Medical: Not on file    Non-medical: Not on file  Tobacco Use  . Smoking status: Former Smoker    Packs/day: 2.00    Years: 50.00    Pack years: 100.00    Types: Cigarettes    Last attempt to quit: 01/18/2011    Years since quitting: 6.4  . Smokeless tobacco: Never Used  . Tobacco comment: STATES QUIT SMOKING 01-18-2011  Substance and Sexual Activity  . Alcohol use: Not Currently    Comment: RECOVERING ALCOHOLIC--   QUIT IN 8144  . Drug use: No  . Sexual activity: Never  Lifestyle  . Physical activity:    Days per week: Not on file    Minutes per session: Not on file  . Stress: Not on file  Relationships  . Social connections:    Talks on phone: Not on file    Gets together: Not on file    Attends religious service: Not on file    Active member of club or organization: Not on file    Attends meetings of clubs or organizations: Not on file    Relationship status: Not on file  . Intimate partner violence:    Fear of current or ex partner: Not on file    Emotionally abused: Not on file    Physically abused: Not on file    Forced sexual activity: Not on file  Other  Topics Concern  . Not on file  Social History Narrative  . Not on file    Past Surgical Hx:  Past Surgical History:  Procedure Laterality Date  . CAROTID ENDARTERECTOMY  1995   RIGHT  . CATARACT EXTRACTION W/ INTRAOCULAR LENS  IMPLANT, BILATERAL    . CERVICAL CONIZATION W/BX  09-23-2008  . CORONARY ANGIOPLASTY WITH STENT PLACEMENT  2000-   INFERIOR MI   X1 STENT TO RCA  . EUS N/A 03/05/2012   Procedure: FULL UPPER ENDOSCOPIC ULTRASOUND (EUS) RADIAL and EGD;  Surgeon:  Milus Banister, MD;  Location: [facility];  Service: Endoscopy;  Laterality: N/A;  ercp scope first than eus scope  . HEMIARTHROPLASTY HIP  12-26-2008   LEFT FEMORAL NECK FX  . ORIF HIP FRACTURE  02-13-2007   RIGHT FEMORAL NECK FX  . ORIF RIGHT DISTAL RADIUS AND RIGHT PROXIMAL HUMEROUS NECK FX'S  10-10-2005  . RIGHT SHOULDER SURG.  2007  . ROBOTIC ASSITED PARTIAL NEPHRECTOMY Left 07/09/2012   Procedure: ROBOTIC ASSITED PARTIAL NEPHRECTOMY;  Surgeon: Dutch Gray, MD;  Location: [facility];  Service: Urology;  Laterality: Left;  . TOTAL HIP ARTHROPLASTY  04-15-2008   POST FAILED  RIGHT HIP ORIF FEMORAL FX  . TOTAL KNEE ARTHROPLASTY  09-22-2009   RIGHT  . UPPER RIGHT VAGINAL REGION  12/28/11   BIOPSY: SQUAMOUS CELL CARCINOMA  . VAGINAL HYSTERECTOMY  07/06/2009   Secondary to dysplasia    Past Medical Hx:  Past Medical History:  Diagnosis Date  . Alcoholism ([place])    recovering since 2000  . Anxiety and depression   . Arthritis BACK  . Bipolar disorder ([place])   . Blepharospasm LEFT EYE  . CHF exacerbation ([place]) 02/24/2017  . Chronic back pain   . Coronary artery disease CARDIOLOGIST- DR [place]--- LAST VISIT NOTE 09-07-2009  W/ CHART  . Emphysema   . History of alcohol abuse RECOVERING SINCE 2000  . History of Left renal mass 03/02/2012   underwent partial nephrectomy  . HTN (hypertension)   . Idiopathic acute facial nerve palsy LEFT SIDE--  BOTOX THERAPY  . Inferior MI ([place]) 2000--  POST PTCA W/ STENT X1  .  Osteoporosis   . Other and unspecified general anesthetics causing adverse effect in therapeutic use post op delirium--  last anes record w/ chart  from   09-22-2009 (spinal w/ light sedation)  . Peripheral vascular disease ([place]) POST RIGHT CAROTID SURG.  1995  . Renal cell carcinoma ([place]) 07/09/12   Left mass  . Rosacea LEFT FACIAL RASH  . S/P radiation therapy 02/21/2012   38.75 Gy HDR 5 Fractions- vaginal cuff  . Scoliosis   . Seizures ([place])    x 1 after abrupt discontinuation of  Clonidine  . Status post carotid endarterectomy RIGHT --  1995  . Status post primary angioplasty with coronary stent 2000--  POST INFERIOR MI  . Unstable balance WALKS W/ CANE  . Vaginal cancer ([place])     Family Hx:  Family History  Problem Relation Age of Onset  . Hypertension Mother   . Hypertension Father    OB/Gyn H Menses onset at age 23 Last menstrual period in her 11s Para 0 She used oral contraceptives a proximate 10 years She denied a history of hormone replacement therapy   Review of Systems  Review of Systems  Respiratory: Positive for shortness of breath.   Musculoskeletal: Positive for back pain and flank pain.  All other systems reviewed and are negative.    Vitals:  Blood pressure 124/61, pulse 63, temperature 97.8 F (36.6 C), resp. rate 20, height 5\' 6"  (1.676 m), weight 173 lb 12.8 oz (78.8 kg), SpO2 97 %.  Physical Exam: ECOG PERFORMANCE STATUS: 0 - Asymptomatic   General :  Well developed, 72 y.o., female in no apparent distress HEENT:  Normocephalic/atraumatic, symmetric, EOMI, eyelids normal Neck:   Supple, no masses.  Lymphatics:  No cervical/ submandibular/ supraclavicular/ infraclavicular/ inguinal adenopathy Respiratory:  Respirations unlabored, no use of accessory muscles CV:   Deferred Breast:  Deferred Musculoskeletal: No CVA  tenderness, normal muscle strength. Abdomen:  Soft, non-tender and nondistended. No evidence of hernia. No masses. Extremities:  No  lymphedema, no erythema, non-tender. Skin:   Normal inspection Neuro/Psych:  No focal motor deficit, no abnormal mental status. Normal gait. Normal affect. Alert and oriented to person, place, and time  Genito Urinary: Vulva: Normal external female genitalia.  Bladder/urethra: Urethral meatus normal in size and location. No lesions or   masses, well supported bladder Speculum exam: Vagina: Significantly atrophic.  No visible lesions however she has telangiectasias consistent with prior radiation.  A Pap smear was obtained.   Cervix: Surgically absent Bimanual exam:  Uterus: Surgically absent  Adnexa: No masses. Rectovaginal:  Good tone, no masses, no cul de sac nodularity, no parametrial involvement or nodularity.   Assessment/Plan: 72 year old with a clinical stage I vaginal carcinoma treated with high-dose rate vaginal brachyherapy. She completed her treatment on February 21, 2012.  1. We'll followup in results for Pap smear from today.  2. She is now more than 5 years from her end of treatment therefore I will have her return in 1 year for an annual pelvic and Pap 3. Given her difficult exam and radiation changes I think she should continue to follow in this clinic on an annual basis 4. Continue vaginal dilator and continue using estrogen cream per vagina   Cc: Harlan Stains, MD (PCP) Isabel Caprice, MD 06/12/2017, 2:11 PM

## 2017-06-19 DIAGNOSIS — H01002 Unspecified blepharitis right lower eyelid: Secondary | ICD-10-CM | POA: Diagnosis not present

## 2017-06-19 DIAGNOSIS — H04123 Dry eye syndrome of bilateral lacrimal glands: Secondary | ICD-10-CM | POA: Diagnosis not present

## 2017-06-19 DIAGNOSIS — H01004 Unspecified blepharitis left upper eyelid: Secondary | ICD-10-CM | POA: Diagnosis not present

## 2017-06-19 DIAGNOSIS — H01001 Unspecified blepharitis right upper eyelid: Secondary | ICD-10-CM | POA: Diagnosis not present

## 2017-06-20 ENCOUNTER — Ambulatory Visit (HOSPITAL_COMMUNITY)
Admission: RE | Admit: 2017-06-20 | Discharge: 2017-06-20 | Disposition: A | Discharge status: Home / Self Care | Payer: Medicare Other | Source: Ambulatory Visit | Attending: Cardiovascular Disease | Admitting: Cardiovascular Disease

## 2017-06-20 DIAGNOSIS — I6523 Occlusion and stenosis of bilateral carotid arteries: Secondary | ICD-10-CM | POA: Insufficient documentation

## 2017-06-21 DIAGNOSIS — M79632 Pain in left forearm: Secondary | ICD-10-CM | POA: Diagnosis not present

## 2017-06-21 DIAGNOSIS — M810 Age-related osteoporosis without current pathological fracture: Secondary | ICD-10-CM | POA: Diagnosis not present

## 2017-06-21 DIAGNOSIS — M79631 Pain in right forearm: Secondary | ICD-10-CM | POA: Diagnosis not present

## 2017-06-21 DIAGNOSIS — S52122A Displaced fracture of head of left radius, initial encounter for closed fracture: Secondary | ICD-10-CM | POA: Diagnosis not present

## 2017-06-21 DIAGNOSIS — M25532 Pain in left wrist: Secondary | ICD-10-CM | POA: Diagnosis not present

## 2017-06-21 DIAGNOSIS — M25522 Pain in left elbow: Secondary | ICD-10-CM | POA: Diagnosis not present

## 2017-06-21 DIAGNOSIS — S63502A Unspecified sprain of left wrist, initial encounter: Secondary | ICD-10-CM | POA: Diagnosis not present

## 2017-06-22 ENCOUNTER — Other Ambulatory Visit: Payer: Self-pay

## 2017-06-22 DIAGNOSIS — I6523 Occlusion and stenosis of bilateral carotid arteries: Secondary | ICD-10-CM

## 2017-06-22 NOTE — Progress Notes (Signed)
carotid 

## 2017-06-26 NOTE — Progress Notes (Signed)
Cardiology Office Note    Date:  06/28/2017   ID:  [name], DOB [birth year], MRN [id number]  PCP:  Harlan Stains, MD  Cardiologist:  Dr. Martinique   Chief Complaint  Patient presents with  . Shortness of Breath    History of Present Illness:  [name] is a 72 y.o. female with PMH of CAD s/p inferior MI with PCI to RCA 2000, carotid artery stenosis s/p R CEA 1995, renal CA s/p partial L nephrectomy 06/2012, HTN, HLD and COPD.  He had negative Myoview in February 2013.  Echocardiogram in June 2014 showed normal EF.  Carotid ultrasound in April 2017 showed 40-59% left ICA stenosis, less than 40% right ICA stenosis.    She presented to the hospital on 02/24/2017 with chest tightness.  She also complained of shortness of breath with minimal exertion.  Serial troponin was negative.  Patient eventually underwent Myoview on 02/26/2017 which showed EF 51%, fixed defect in inferolateral wall consistent with prior MI, there appears to be some mild peri-infarct ischemia in the adjacent lateral wall, overall considered a high risk study.  Cardiology was not directly consulted at that time.   She has known inferior MI in 2000 which likely resulted in the scar that was seen on the Myoview.  She also had echocardiogram on 02/26/2017 which showed EF 45-50%, hypokinesis and a scarring of basal inferolateral and inferior myocardium consistent with infarction in the RCA, grade 1 DD. She was seen by Almyra Deforest PA-C post hospital and had no recurrent chest pain.  She was later seen by Dr. Oneida Alar for PAD. Noted to have bilateral fem pop disease. She was not significantly symptomatic and conservative therapy recommended. Carotid dopplers in June were stable.   Today she reports she is doing well. She still has some SOB. Uses albuterol once or twice a day. For last 2 days has a mild cough with some yellow phlegm. No edema and weight is stable. She fell one week ago and has a hairline fracture of her left elbow.      Past Medical History:  Diagnosis Date  . Alcoholism ([place])    recovering since 2000  . Anxiety and depression   . Arthritis BACK  . Bipolar disorder ([place])   . Blepharospasm LEFT EYE  . CHF exacerbation ([place]) 02/24/2017  . Chronic back pain   . Coronary artery disease CARDIOLOGIST- DR Martinique--- LAST VISIT NOTE 09-07-2009  W/ CHART  . Emphysema   . History of alcohol abuse RECOVERING SINCE 2000  . History of Left renal mass 03/02/2012   underwent partial nephrectomy  . HTN (hypertension)   . Idiopathic acute facial nerve palsy LEFT SIDE--  BOTOX THERAPY  . Inferior MI ([place]) 2000--  POST PTCA W/ STENT X1  . Osteoporosis   . Other and unspecified general anesthetics causing adverse effect in therapeutic use post op delirium--  last anes record w/ chart  from   09-22-2009 (spinal w/ light sedation)  . Peripheral vascular disease ([place]) POST RIGHT CAROTID SURG.  1995  . Renal cell carcinoma ([place]) 07/09/12   Left mass  . Rosacea LEFT FACIAL RASH  . S/P radiation therapy 02/21/2012   38.75 Gy HDR 5 Fractions- vaginal cuff  . Scoliosis   . Seizures ([place])    x 1 after abrupt discontinuation of  Clonidine  . Status post carotid endarterectomy RIGHT --  1995  . Status post primary angioplasty with coronary stent 2000--  POST INFERIOR MI  .  Unstable balance WALKS W/ CANE  . Vaginal cancer [facility])     Past Surgical History:  Procedure Laterality Date  . CAROTID ENDARTERECTOMY  1995   RIGHT  . CATARACT EXTRACTION W/ INTRAOCULAR LENS  IMPLANT, BILATERAL    . CERVICAL CONIZATION W/BX  09-23-2008  . CORONARY ANGIOPLASTY WITH STENT PLACEMENT  2000-   INFERIOR MI   X1 STENT TO RCA  . EUS N/A 03/05/2012   Procedure: FULL UPPER ENDOSCOPIC ULTRASOUND (EUS) RADIAL and EGD;  Surgeon: Milus Banister, MD;  Location: [facility] ENDOSCOPY;  Service: Endoscopy;  Laterality: N/A;  ercp scope first than eus scope  . HEMIARTHROPLASTY HIP  12-26-2008   LEFT FEMORAL NECK FX  . ORIF HIP FRACTURE  02-13-2007    RIGHT FEMORAL NECK FX  . ORIF RIGHT DISTAL RADIUS AND RIGHT PROXIMAL HUMEROUS NECK FX'S  10-10-2005  . RIGHT SHOULDER SURG.  2007  . ROBOTIC ASSITED PARTIAL NEPHRECTOMY Left 07/09/2012   Procedure: ROBOTIC ASSITED PARTIAL NEPHRECTOMY;  Surgeon: Dutch Gray, MD;  Location: [facility] ORS;  Service: Urology;  Laterality: Left;  . TOTAL HIP ARTHROPLASTY  04-15-2008   POST FAILED  RIGHT HIP ORIF FEMORAL FX  . TOTAL KNEE ARTHROPLASTY  09-22-2009   RIGHT  . UPPER RIGHT VAGINAL REGION  12/28/11   BIOPSY: SQUAMOUS CELL CARCINOMA  . VAGINAL HYSTERECTOMY  07/06/2009   Secondary to dysplasia    Current Medications: Outpatient Medications Prior to Visit  Medication Sig Dispense Refill  . albuterol (PROVENTIL HFA;VENTOLIN HFA) 108 (90 BASE) MCG/ACT inhaler Inhale 2 puffs into the lungs every 6 (six) hours as needed for wheezing.    Marland Kitchen amLODipine (NORVASC) 5 MG tablet Take 1 tablet (5 mg total) by mouth daily. 30 tablet 0  . ARIPiprazole (ABILIFY) 20 MG tablet Take 10 mg by mouth every morning.     Marland Kitchen aspirin EC 81 MG tablet Take 81 mg by mouth every morning.     . Calcium Carbonate-Vitamin D 600-400 MG-UNIT per tablet Take 1 tablet by mouth 2 (two) times daily.     . Cholecalciferol (VITAMIN D3) 2000 UNITS TABS Take 2,000 Units by mouth 2 (two) times daily.     . cloNIDine (CATAPRES) 0.2 MG tablet Take 0.2 mg by mouth 2 (two) times daily.     . cycloSPORINE (RESTASIS) 0.05 % ophthalmic emulsion Place 1 drop into both eyes 2 (two) times daily.    Marland Kitchen dexlansoprazole (DEXILANT) 60 MG capsule Take 60 mg by mouth daily.    Marland Kitchen docusate sodium (COLACE) 100 MG capsule Take 100 mg by mouth as directed.    . furosemide (LASIX) 20 MG tablet Take 20 mg by mouth every morning.    . lamoTRIgine (LAMICTAL) 100 MG tablet Take 200 mg by mouth 2 (two) times daily.     Marland Kitchen losartan (COZAAR) 100 MG tablet Take 100 mg by mouth daily.    . mometasone-formoterol (DULERA) 100-5 MCG/ACT AERO Inhale 2 puffs into the lungs 2 (two) times  daily. 1 Inhaler 3  . Multiple Vitamin (MULTIVITAMIN) capsule Take 1 capsule by mouth every morning.     . nitroGLYCERIN (NITROSTAT) 0.4 MG SL tablet Place 1 tablet (0.4 mg total) under the tongue every 5 (five) minutes as needed for chest pain. 90 tablet 3  . oxyCODONE-acetaminophen (PERCOCET) 10-325 MG per tablet Take 1 tablet by mouth every 4 (four) hours.     . Pitavastatin Calcium (LIVALO) 1 MG TABS Take 1 tablet by mouth daily.    . polyethylene glycol (MIRALAX /  GLYCOLAX) packet Take 17 g by mouth daily as needed for mild constipation or moderate constipation.     . potassium chloride (KLOR-CON) 8 MEQ tablet Take 8 mEq by mouth every morning.     . sertraline (ZOLOFT) 50 MG tablet Take 50 mg by mouth every morning.     . tiotropium (SPIRIVA) 18 MCG inhalation capsule Place 18 mcg into inhaler and inhale every evening.     . docusate sodium (COLACE) 100 MG capsule Take 1 capsule (100 mg total) by mouth 2 (two) times daily. (Patient not taking: Reported on 06/28/2017) 30 capsule 0   No facility-administered medications prior to visit.      Allergies:   Patient has no known allergies.   Social History   Socioeconomic History  . Marital status: Divorced    Spouse name: Not on file  . Number of children: Not on file  . Years of education: Not on file  . Highest education level: Not on file  Occupational History  . Not on file  Social Needs  . Financial resource strain: Not on file  . Food insecurity:    Worry: Not on file    Inability: Not on file  . Transportation needs:    Medical: Not on file    Non-medical: Not on file  Tobacco Use  . Smoking status: Former Smoker    Packs/day: 2.00    Years: 50.00    Pack years: 100.00    Types: Cigarettes    Last attempt to quit: 01/18/2011    Years since quitting: 6.4  . Smokeless tobacco: Never Used  . Tobacco comment: STATES QUIT SMOKING 01-18-2011  Substance and Sexual Activity  . Alcohol use: Not Currently    Comment:  RECOVERING ALCOHOLIC--   QUIT IN 7829  . Drug use: No  . Sexual activity: Never  Lifestyle  . Physical activity:    Days per week: Not on file    Minutes per session: Not on file  . Stress: Not on file  Relationships  . Social connections:    Talks on phone: Not on file    Gets together: Not on file    Attends religious service: Not on file    Active member of club or organization: Not on file    Attends meetings of clubs or organizations: Not on file    Relationship status: Not on file  Other Topics Concern  . Not on file  Social History Narrative  . Not on file     Family History:  The patient's family history includes Hypertension in her father and mother.   ROS:   Please see the history of present illness.    ROS All other systems reviewed and are negative.   PHYSICAL EXAM:   VS:  BP 122/78   Pulse 68   Ht 5\' 6"  (1.676 m)   Wt 171 lb (77.6 kg)   SpO2 93%   BMI 27.60 kg/m    GENERAL:  Well appearing WF in NAD HEENT:  PERRL, EOMI, sclera are clear. Oropharynx is clear. NECK:  No jugular venous distention, carotid upstroke brisk and symmetric, no bruits, no thyromegaly or adenopathy LUNGS:  Clear to auscultation bilaterally CHEST:  Unremarkable HEART:  RRR,  PMI not displaced or sustained,S1 and S2 within normal limits, no S3, no S4: no clicks, no rubs, no murmurs ABD:  Soft, nontender. BS +, no masses or bruits. No hepatomegaly, no splenomegaly EXT:  2 + pulses throughout, no edema, no cyanosis  no clubbing. Left arm is in a cast. SKIN:  Warm and dry.  No rashes NEURO:  Alert and oriented x 3. Cranial nerves II through XII intact. PSYCH:  Cognitively intact    Wt Readings from Last 3 Encounters:  06/28/17 171 lb (77.6 kg)  05/23/17 173 lb 12.8 oz (78.8 kg)  03/30/17 172 lb (78 kg)      Studies/Labs Reviewed:   EKG:  EKG is not ordered today.    Recent Labs: 02/24/2017: B Natriuretic Peptide 274.9; Hemoglobin 15.2; Platelets 295 02/25/2017: BUN 7;  Creatinine, Ser 0.88; Potassium 3.9; Sodium 135   Lipid Panel    Component Value Date/Time   CHOL  12/25/2008 0550    161        ATP III CLASSIFICATION:  <200     mg/dL   Desirable  200-239  mg/dL   Borderline High  >=240    mg/dL   High          TRIG 39 12/25/2008 0550   HDL 102 12/25/2008 0550   CHOLHDL 1.6 12/25/2008 0550   VLDL 8 12/25/2008 0550   LDLCALC  12/25/2008 0550    51        Total Cholesterol/HDL:CHD Risk Coronary Heart Disease Risk Table                     Men   Women  1/2 Average Risk   3.4   3.3  Average Risk       5.0   4.4  2 X Average Risk   9.6   7.1  3 X Average Risk  23.4   11.0        Use the calculated Patient Ratio above and the CHD Risk Table to determine the patient's CHD Risk.        ATP III CLASSIFICATION (LDL):  <100     mg/dL   Optimal  100-129  mg/dL   Near or Above                    Optimal  130-159  mg/dL   Borderline  160-189  mg/dL   High  >190     mg/dL   Very High   Dated 05/16/17: cholesterol 163, triglycerides 72, HDL 77, LDL 72. CBC, Chemistries, TSH normal.    Additional studies/ records that were reviewed today include:   Myoview 02/26/2017 IMPRESSION: 1. Fixed defect in the inferolateral wall, likely old infarct. There appears to be some mild peri-infarct ischemia in the adjacent lateral wall.  2. Decreased wall motion and thickening in the inferolateral wall.  3. Left ventricular ejection fraction 51%  4. Non invasive risk stratification*: High   Echo 02/26/2017 LV EF: 45% -   50%  Study Conclusions  - Left ventricle: The cavity size was normal. There was mild   concentric hypertrophy. Systolic function was mildly reduced. The   estimated ejection fraction was in the range of 45% to 50%.   Hypokinesis and scarring of the basal-midinferolateral and   inferior myocardium; consistent with infarction in the   distribution of the right coronary or left circumflex coronary   artery. Doppler parameters are  consistent with abnormal left   ventricular relaxation (grade 1 diastolic dysfunction). - Mitral valve: Calcified annulus.  NONINVASIVE PHYSIOLOGIC VASCULAR STUDY OF BILATERAL LOWER EXTREMITIES  TECHNIQUE: Non-invasive vascular evaluation of both lower extremities was performed at rest, including calculation of ankle-brachial indices, multiple segmental pressure evaluation, segmental Doppler and segmental  pulse volume recording.  COMPARISON:  None.  FINDINGS: Right Lower Extremity  Resting ABI:  0.81  Resting TBI: 0.54  Segmental Pressures: Significant pressure gradient between the high and low thigh cuff suggesting outflow (superficial femoral artery) disease. Great toe pressure: 86 mm Hg  Arterial Waveforms: Triphasic arterial waveforms transitioned biphasic from the popliteal station down.  PVRs: Absent calf augmentation suggesting femoral disease.  Left Lower Extremity:  Resting ABI: 0.69  Resting TBI: 0.51  Segmental Pressures: Significant pressure gradient between the high and low thigh cuff as well as the low thigh cuff and calf cuffs suggesting femoropopliteal disease. Great toe pressure: 82 mm Hg  Arterial Waveforms: Triphasic arterial waveforms transition to biphasic from the popliteal station down.  PVRs: Absent calf augmentation consistent with femoropopliteal disease.  Other: Symmetric upper extremity pressures.  Ankle Brachial index  > 1.4 Non diagnostic secondary to incompressible vessel calcifications  1.0-1.4       Normal  0.9-0.99     Borderline PAD  0.8-0.89     Mild PAD  0.5-0.79     Moderate PAD  < 0.5          Severe PAD  Toe Brachial Index  Normal     >0.65  Moderate  0.53-0.64  Severe     <0.23  Toe Pressures  Absolute toe pressure >40mmHg sufficient for wound healing.  Toe pressures <80mmHg = critical limb ischemia.  IMPRESSION: 1. Resting right ankle brachial index of 0.81 consistent  with moderate peripheral arterial disease which appears to localize to the runoff (superficial femoral) distribution. 2. Resting left ankle brachial index of 0.69 consistent with moderate peripheral arterial disease which appears to localize to the runoff (femoropopliteal) distribution.  Given the clinical history of claudication, recommend referral to Interventional Radiology or other endovascular specialist for further evaluation and management.  Signed,  Criselda Peaches, MD   ASSESSMENT:    1. Coronary artery disease involving native coronary artery of native heart without angina pectoris   2. Essential hypertension   3. Hyperlipidemia, unspecified hyperlipidemia type   4. PAD (peripheral artery disease) ([place])   5. Chronic diastolic CHF (congestive heart failure) (HCC)      PLAN:  In order of problems listed above:  1. Coronary artery disease: Myoview in February was low risk with small inferior wall fixed defect. EF 51%.  Echocardiogram  showed mildly low EF. She is asymptomatic. Will continue current medical therapy.  2. PAD: evaluated by Dr. Oneida Alar. Felt to have noncritical PAD and walking program recommended.   3. Hypertension: Blood pressure well controlled.  4. Hyperlipidemia: On Livalo.  Excellent lipid levels in April.   5.   COPD. On inhaler therapy  6.   Left elbow fracture - seeing ortho this week.    Medication Adjustments/Labs and Tests Ordered: Current medicines are reviewed at length with the patient today.  Concerns regarding medicines are outlined above.  Medication changes, Labs and Tests ordered today are listed in the Patient Instructions below. Patient Instructions  Continue your current therapy and sodium restriction  I will see you in 6 months.      Signed, Derwin Reddy Martinique, MD  06/28/2017 1:34 PM    [facility] [place], [place], [place]  [address] Phone: [phone]; Fax: [phone]

## 2017-06-28 ENCOUNTER — Other Ambulatory Visit: Payer: Self-pay

## 2017-06-28 ENCOUNTER — Ambulatory Visit (INDEPENDENT_AMBULATORY_CARE_PROVIDER_SITE_OTHER): Payer: Medicare Other | Admitting: Cardiology

## 2017-06-28 ENCOUNTER — Encounter: Payer: Self-pay | Admitting: Cardiology

## 2017-06-28 VITALS — BP 122/78 | HR 68 | Ht 66.0 in | Wt 171.0 lb

## 2017-06-28 DIAGNOSIS — I2510 Atherosclerotic heart disease of native coronary artery without angina pectoris: Secondary | ICD-10-CM | POA: Diagnosis not present

## 2017-06-28 DIAGNOSIS — I10 Essential (primary) hypertension: Secondary | ICD-10-CM

## 2017-06-28 DIAGNOSIS — I739 Peripheral vascular disease, unspecified: Secondary | ICD-10-CM | POA: Diagnosis not present

## 2017-06-28 DIAGNOSIS — E785 Hyperlipidemia, unspecified: Secondary | ICD-10-CM | POA: Diagnosis not present

## 2017-06-28 DIAGNOSIS — I6523 Occlusion and stenosis of bilateral carotid arteries: Secondary | ICD-10-CM

## 2017-06-28 DIAGNOSIS — I5032 Chronic diastolic (congestive) heart failure: Secondary | ICD-10-CM | POA: Diagnosis not present

## 2017-06-28 NOTE — Progress Notes (Signed)
[id number] 

## 2017-06-28 NOTE — Patient Instructions (Signed)
Continue your current therapy and sodium restriction  I will see you in 6 months.

## 2017-06-29 DIAGNOSIS — S52125D Nondisplaced fracture of head of left radius, subsequent encounter for closed fracture with routine healing: Secondary | ICD-10-CM | POA: Diagnosis not present

## 2017-06-29 DIAGNOSIS — S52122A Displaced fracture of head of left radius, initial encounter for closed fracture: Secondary | ICD-10-CM

## 2017-06-29 HISTORY — DX: Displaced fracture of head of left radius, initial encounter for closed fracture: S52.122A

## 2017-06-30 DIAGNOSIS — G518 Other disorders of facial nerve: Secondary | ICD-10-CM | POA: Diagnosis not present

## 2017-07-04 DIAGNOSIS — M25622 Stiffness of left elbow, not elsewhere classified: Secondary | ICD-10-CM | POA: Diagnosis not present

## 2017-07-11 DIAGNOSIS — M25629 Stiffness of unspecified elbow, not elsewhere classified: Secondary | ICD-10-CM | POA: Diagnosis not present

## 2017-07-12 DIAGNOSIS — M5030 Other cervical disc degeneration, unspecified cervical region: Secondary | ICD-10-CM | POA: Diagnosis not present

## 2017-07-12 DIAGNOSIS — M545 Low back pain: Secondary | ICD-10-CM | POA: Diagnosis not present

## 2017-07-12 DIAGNOSIS — Z79891 Long term (current) use of opiate analgesic: Secondary | ICD-10-CM | POA: Diagnosis not present

## 2017-07-12 DIAGNOSIS — M5136 Other intervertebral disc degeneration, lumbar region: Secondary | ICD-10-CM | POA: Diagnosis not present

## 2017-07-19 DIAGNOSIS — M25629 Stiffness of unspecified elbow, not elsewhere classified: Secondary | ICD-10-CM | POA: Diagnosis not present

## 2017-07-21 DIAGNOSIS — S52125D Nondisplaced fracture of head of left radius, subsequent encounter for closed fracture with routine healing: Secondary | ICD-10-CM | POA: Diagnosis not present

## 2017-07-27 DIAGNOSIS — M5030 Other cervical disc degeneration, unspecified cervical region: Secondary | ICD-10-CM | POA: Diagnosis not present

## 2017-07-27 DIAGNOSIS — F319 Bipolar disorder, unspecified: Secondary | ICD-10-CM | POA: Diagnosis not present

## 2017-07-27 DIAGNOSIS — E785 Hyperlipidemia, unspecified: Secondary | ICD-10-CM | POA: Diagnosis not present

## 2017-07-27 DIAGNOSIS — I10 Essential (primary) hypertension: Secondary | ICD-10-CM | POA: Diagnosis not present

## 2017-07-31 DIAGNOSIS — H01004 Unspecified blepharitis left upper eyelid: Secondary | ICD-10-CM | POA: Diagnosis not present

## 2017-07-31 DIAGNOSIS — H04123 Dry eye syndrome of bilateral lacrimal glands: Secondary | ICD-10-CM | POA: Diagnosis not present

## 2017-07-31 DIAGNOSIS — H01001 Unspecified blepharitis right upper eyelid: Secondary | ICD-10-CM | POA: Diagnosis not present

## 2017-07-31 DIAGNOSIS — H01002 Unspecified blepharitis right lower eyelid: Secondary | ICD-10-CM | POA: Diagnosis not present

## 2017-08-10 ENCOUNTER — Ambulatory Visit: Payer: Medicare Other | Admitting: Endocrinology

## 2017-08-18 DIAGNOSIS — S52125D Nondisplaced fracture of head of left radius, subsequent encounter for closed fracture with routine healing: Secondary | ICD-10-CM | POA: Diagnosis not present

## 2017-08-22 ENCOUNTER — Ambulatory Visit (HOSPITAL_COMMUNITY)
Admission: RE | Admit: 2017-08-22 | Discharge: 2017-08-22 | Disposition: A | Discharge status: Home / Self Care | Payer: Medicare Other | Source: Ambulatory Visit | Attending: Urology | Admitting: Urology

## 2017-08-22 ENCOUNTER — Other Ambulatory Visit: Payer: Self-pay | Admitting: Urology

## 2017-08-22 DIAGNOSIS — Z85528 Personal history of other malignant neoplasm of kidney: Secondary | ICD-10-CM | POA: Diagnosis not present

## 2017-08-22 DIAGNOSIS — Z87891 Personal history of nicotine dependence: Secondary | ICD-10-CM | POA: Insufficient documentation

## 2017-08-22 DIAGNOSIS — I700 Atherosclerosis of aorta: Secondary | ICD-10-CM | POA: Insufficient documentation

## 2017-08-22 DIAGNOSIS — J449 Chronic obstructive pulmonary disease, unspecified: Secondary | ICD-10-CM | POA: Diagnosis not present

## 2017-08-22 DIAGNOSIS — C649 Malignant neoplasm of unspecified kidney, except renal pelvis: Secondary | ICD-10-CM | POA: Diagnosis not present

## 2017-08-29 DIAGNOSIS — Z85528 Personal history of other malignant neoplasm of kidney: Secondary | ICD-10-CM | POA: Diagnosis not present

## 2017-08-29 DIAGNOSIS — N3942 Incontinence without sensory awareness: Secondary | ICD-10-CM | POA: Diagnosis not present

## 2017-08-31 ENCOUNTER — Other Ambulatory Visit: Payer: Self-pay | Admitting: Family Medicine

## 2017-08-31 DIAGNOSIS — Z1231 Encounter for screening mammogram for malignant neoplasm of breast: Secondary | ICD-10-CM

## 2017-09-15 DIAGNOSIS — D692 Other nonthrombocytopenic purpura: Secondary | ICD-10-CM | POA: Diagnosis not present

## 2017-09-25 DIAGNOSIS — H01001 Unspecified blepharitis right upper eyelid: Secondary | ICD-10-CM | POA: Diagnosis not present

## 2017-09-25 DIAGNOSIS — H01002 Unspecified blepharitis right lower eyelid: Secondary | ICD-10-CM | POA: Diagnosis not present

## 2017-09-25 DIAGNOSIS — H01004 Unspecified blepharitis left upper eyelid: Secondary | ICD-10-CM | POA: Diagnosis not present

## 2017-09-25 DIAGNOSIS — H04123 Dry eye syndrome of bilateral lacrimal glands: Secondary | ICD-10-CM | POA: Diagnosis not present

## 2017-09-28 ENCOUNTER — Other Ambulatory Visit: Payer: Self-pay | Admitting: Family Medicine

## 2017-09-28 ENCOUNTER — Ambulatory Visit
Admission: RE | Admit: 2017-09-28 | Discharge: 2017-09-28 | Disposition: A | Discharge status: Home / Self Care | Payer: Medicare Other | Source: Ambulatory Visit | Attending: Family Medicine | Admitting: Family Medicine

## 2017-09-28 DIAGNOSIS — N644 Mastodynia: Secondary | ICD-10-CM

## 2017-09-28 DIAGNOSIS — Z1231 Encounter for screening mammogram for malignant neoplasm of breast: Secondary | ICD-10-CM

## 2017-10-05 ENCOUNTER — Other Ambulatory Visit: Payer: Self-pay | Admitting: Family Medicine

## 2017-10-05 ENCOUNTER — Ambulatory Visit
Admission: RE | Admit: 2017-10-05 | Discharge: 2017-10-05 | Disposition: A | Discharge status: Home / Self Care | Payer: Medicare Other | Source: Ambulatory Visit | Attending: Family Medicine | Admitting: Family Medicine

## 2017-10-05 DIAGNOSIS — N644 Mastodynia: Secondary | ICD-10-CM

## 2017-10-05 DIAGNOSIS — R599 Enlarged lymph nodes, unspecified: Secondary | ICD-10-CM

## 2017-10-05 DIAGNOSIS — N6320 Unspecified lump in the left breast, unspecified quadrant: Secondary | ICD-10-CM

## 2017-10-05 DIAGNOSIS — R928 Other abnormal and inconclusive findings on diagnostic imaging of breast: Secondary | ICD-10-CM | POA: Diagnosis not present

## 2017-10-05 DIAGNOSIS — N6321 Unspecified lump in the left breast, upper outer quadrant: Secondary | ICD-10-CM | POA: Diagnosis not present

## 2017-10-10 ENCOUNTER — Ambulatory Visit
Admission: RE | Admit: 2017-10-10 | Discharge: 2017-10-10 | Disposition: A | Discharge status: Home / Self Care | Payer: Medicare Other | Source: Ambulatory Visit | Attending: Family Medicine | Admitting: Family Medicine

## 2017-10-10 ENCOUNTER — Other Ambulatory Visit: Payer: Self-pay | Admitting: Family Medicine

## 2017-10-10 DIAGNOSIS — N6320 Unspecified lump in the left breast, unspecified quadrant: Secondary | ICD-10-CM

## 2017-10-10 DIAGNOSIS — N6321 Unspecified lump in the left breast, upper outer quadrant: Secondary | ICD-10-CM | POA: Diagnosis not present

## 2017-10-10 DIAGNOSIS — R599 Enlarged lymph nodes, unspecified: Secondary | ICD-10-CM | POA: Diagnosis not present

## 2017-10-10 DIAGNOSIS — N6012 Diffuse cystic mastopathy of left breast: Secondary | ICD-10-CM | POA: Diagnosis not present

## 2017-10-11 ENCOUNTER — Telehealth: Payer: Self-pay | Admitting: Cardiology

## 2017-10-11 ENCOUNTER — Other Ambulatory Visit: Payer: Self-pay | Admitting: Family Medicine

## 2017-10-11 DIAGNOSIS — N63 Unspecified lump in unspecified breast: Secondary | ICD-10-CM

## 2017-10-11 NOTE — Telephone Encounter (Signed)
Returned call to [facility] with [facility] calling to find out if Dr.Jordan ordered a CXR.Stated she was trying to find out name of Dr who ordered patient's cxr.Advised Dr.Jordan did not order.Advised to call PCP.

## 2017-10-11 NOTE — Telephone Encounter (Signed)
New Message:        [name] is calling from the hospital and states they had an order for an chest x-ray for this pt. They want to know if [name]/[name] was the one to order this. If one of the two ordered this they need the order faxed over to [phone]. If not then can we please call them and let them know that we were not the ones who ordered the xray

## 2017-10-13 DIAGNOSIS — G5131 Clonic hemifacial spasm, right: Secondary | ICD-10-CM | POA: Diagnosis not present

## 2017-10-30 DIAGNOSIS — H01001 Unspecified blepharitis right upper eyelid: Secondary | ICD-10-CM | POA: Diagnosis not present

## 2017-10-30 DIAGNOSIS — H01002 Unspecified blepharitis right lower eyelid: Secondary | ICD-10-CM | POA: Diagnosis not present

## 2017-10-30 DIAGNOSIS — H04123 Dry eye syndrome of bilateral lacrimal glands: Secondary | ICD-10-CM | POA: Diagnosis not present

## 2017-10-30 DIAGNOSIS — H01004 Unspecified blepharitis left upper eyelid: Secondary | ICD-10-CM | POA: Diagnosis not present

## 2017-11-02 DIAGNOSIS — Z6832 Body mass index (BMI) 32.0-32.9, adult: Secondary | ICD-10-CM | POA: Diagnosis not present

## 2017-11-02 DIAGNOSIS — I10 Essential (primary) hypertension: Secondary | ICD-10-CM | POA: Diagnosis not present

## 2017-11-02 DIAGNOSIS — F319 Bipolar disorder, unspecified: Secondary | ICD-10-CM | POA: Diagnosis not present

## 2017-11-02 DIAGNOSIS — Z23 Encounter for immunization: Secondary | ICD-10-CM | POA: Diagnosis not present

## 2017-11-08 DIAGNOSIS — Z79899 Other long term (current) drug therapy: Secondary | ICD-10-CM | POA: Diagnosis not present

## 2017-12-01 DIAGNOSIS — G518 Other disorders of facial nerve: Secondary | ICD-10-CM | POA: Diagnosis not present

## 2017-12-06 DIAGNOSIS — M545 Low back pain: Secondary | ICD-10-CM | POA: Diagnosis not present

## 2017-12-06 DIAGNOSIS — Z85528 Personal history of other malignant neoplasm of kidney: Secondary | ICD-10-CM | POA: Diagnosis not present

## 2017-12-26 NOTE — Progress Notes (Deleted)
Cardiology Office Note    Date:  12/26/2017   ID:  [name], DOB [birth year], MRN [id number]  PCP:  Harlan Stains, MD  Cardiologist:  Dr. Martinique   No chief complaint on file.   History of Present Illness:  [name] is a 72 y.o. female with PMH of CAD s/p inferior MI with PCI to RCA 2000, carotid artery stenosis s/p R CEA 1995, renal CA s/p partial L nephrectomy 06/2012, HTN, HLD and COPD.  He had negative Myoview in February 2013.  Echocardiogram in June 2014 showed normal EF.  Carotid ultrasound in April 2017 showed 40-59% left ICA stenosis, less than 40% right ICA stenosis.    She presented to the hospital on 02/24/2017 with chest tightness.  She also complained of shortness of breath with minimal exertion.  Serial troponin was negative.  Patient eventually underwent Myoview on 02/26/2017 which showed EF 51%, fixed defect in inferolateral wall consistent with prior MI, there appears to be some mild peri-infarct ischemia in the adjacent lateral wall, overall considered a high risk study.  Cardiology was not directly consulted at that time.   She has known inferior MI in 2000 which likely resulted in the scar that was seen on the Myoview.  She also had echocardiogram on 02/26/2017 which showed EF 45-50%, hypokinesis and a scarring of basal inferolateral and inferior myocardium consistent with infarction in the RCA, grade 1 DD. She was seen by Almyra Deforest PA-C post hospital and had no recurrent chest pain.  She was later seen by Dr. Oneida Alar for PAD. Noted to have bilateral fem pop disease. She was not significantly symptomatic and conservative therapy recommended. Carotid dopplers in June were stable.   Today she reports she is doing well. She still has some SOB. Uses albuterol once or twice a day. For last 2 days has a mild cough with some yellow phlegm. No edema and weight is stable. She fell one week ago and has a hairline fracture of her left elbow.     Past Medical History:    Diagnosis Date  . Alcoholism ([place])    recovering since 2000  . Anxiety and depression   . Arthritis BACK  . Bipolar disorder ([place])   . Blepharospasm LEFT EYE  . CHF exacerbation ([place]) 02/24/2017  . Chronic back pain   . Coronary artery disease CARDIOLOGIST- DR Martinique--- LAST VISIT NOTE 09-07-2009  W/ CHART  . Emphysema   . History of alcohol abuse RECOVERING SINCE 2000  . History of Left renal mass 03/02/2012   underwent partial nephrectomy  . HTN (hypertension)   . Idiopathic acute facial nerve palsy LEFT SIDE--  BOTOX THERAPY  . Inferior MI ([place]) 2000--  POST PTCA W/ STENT X1  . Osteoporosis   . Other and unspecified general anesthetics causing adverse effect in therapeutic use post op delirium--  last anes record w/ chart  from   09-22-2009 (spinal w/ light sedation)  . Peripheral vascular disease ([place]) POST RIGHT CAROTID SURG.  1995  . Renal cell carcinoma ([place]) 07/09/12   Left mass  . Rosacea LEFT FACIAL RASH  . S/P radiation therapy 02/21/2012   38.75 Gy HDR 5 Fractions- vaginal cuff  . Scoliosis   . Seizures ([place])    x 1 after abrupt discontinuation of  Clonidine  . Status post carotid endarterectomy RIGHT --  1995  . Status post primary angioplasty with coronary stent 2000--  POST INFERIOR MI  . Unstable balance WALKS W/ CANE  .  Vaginal cancer [facility])     Past Surgical History:  Procedure Laterality Date  . CAROTID ENDARTERECTOMY  1995   RIGHT  . CATARACT EXTRACTION W/ INTRAOCULAR LENS  IMPLANT, BILATERAL    . CERVICAL CONIZATION W/BX  09-23-2008  . CORONARY ANGIOPLASTY WITH STENT PLACEMENT  2000-   INFERIOR MI   X1 STENT TO RCA  . EUS N/A 03/05/2012   Procedure: FULL UPPER ENDOSCOPIC ULTRASOUND (EUS) RADIAL and EGD;  Surgeon: Milus Banister, MD;  Location: [facility];  Service: Endoscopy;  Laterality: N/A;  ercp scope first than eus scope  . HEMIARTHROPLASTY HIP  12-26-2008   LEFT FEMORAL NECK FX  . ORIF HIP FRACTURE  02-13-2007   RIGHT FEMORAL NECK FX  . ORIF  RIGHT DISTAL RADIUS AND RIGHT PROXIMAL HUMEROUS NECK FX'S  10-10-2005  . RIGHT SHOULDER SURG.  2007  . ROBOTIC ASSITED PARTIAL NEPHRECTOMY Left 07/09/2012   Procedure: ROBOTIC ASSITED PARTIAL NEPHRECTOMY;  Surgeon: Dutch Gray, MD;  Location: [facility];  Service: Urology;  Laterality: Left;  . TOTAL HIP ARTHROPLASTY  04-15-2008   POST FAILED  RIGHT HIP ORIF FEMORAL FX  . TOTAL KNEE ARTHROPLASTY  09-22-2009   RIGHT  . UPPER RIGHT VAGINAL REGION  12/28/11   BIOPSY: SQUAMOUS CELL CARCINOMA  . VAGINAL HYSTERECTOMY  07/06/2009   Secondary to dysplasia    Current Medications: Outpatient Medications Prior to Visit  Medication Sig Dispense Refill  . albuterol (PROVENTIL HFA;VENTOLIN HFA) 108 (90 BASE) MCG/ACT inhaler Inhale 2 puffs into the lungs every 6 (six) hours as needed for wheezing.    Marland Kitchen amLODipine (NORVASC) 5 MG tablet Take 1 tablet (5 mg total) by mouth daily. 30 tablet 0  . ARIPiprazole (ABILIFY) 20 MG tablet Take 10 mg by mouth every morning.     Marland Kitchen aspirin EC 81 MG tablet Take 81 mg by mouth every morning.     . Calcium Carbonate-Vitamin D 600-400 MG-UNIT per tablet Take 1 tablet by mouth 2 (two) times daily.     . Cholecalciferol (VITAMIN D3) 2000 UNITS TABS Take 2,000 Units by mouth 2 (two) times daily.     . cloNIDine (CATAPRES) 0.2 MG tablet Take 0.2 mg by mouth 2 (two) times daily.     . cycloSPORINE (RESTASIS) 0.05 % ophthalmic emulsion Place 1 drop into both eyes 2 (two) times daily.    Marland Kitchen dexlansoprazole (DEXILANT) 60 MG capsule Take 60 mg by mouth daily.    Marland Kitchen docusate sodium (COLACE) 100 MG capsule Take 100 mg by mouth as directed.    . furosemide (LASIX) 20 MG tablet Take 20 mg by mouth every morning.    . lamoTRIgine (LAMICTAL) 100 MG tablet Take 200 mg by mouth 2 (two) times daily.     Marland Kitchen losartan (COZAAR) 100 MG tablet Take 100 mg by mouth daily.    . mometasone-formoterol (DULERA) 100-5 MCG/ACT AERO Inhale 2 puffs into the lungs 2 (two) times daily. 1 Inhaler 3  . Multiple  Vitamin (MULTIVITAMIN) capsule Take 1 capsule by mouth every morning.     . nitroGLYCERIN (NITROSTAT) 0.4 MG SL tablet Place 1 tablet (0.4 mg total) under the tongue every 5 (five) minutes as needed for chest pain. 90 tablet 3  . oxyCODONE-acetaminophen (PERCOCET) 10-325 MG per tablet Take 1 tablet by mouth every 4 (four) hours.     . Pitavastatin Calcium (LIVALO) 1 MG TABS Take 1 tablet by mouth daily.    . polyethylene glycol (MIRALAX / GLYCOLAX) packet Take 17 g by  mouth daily as needed for mild constipation or moderate constipation.     . potassium chloride (KLOR-CON) 8 MEQ tablet Take 8 mEq by mouth every morning.     . sertraline (ZOLOFT) 50 MG tablet Take 50 mg by mouth every morning.     . tiotropium (SPIRIVA) 18 MCG inhalation capsule Place 18 mcg into inhaler and inhale every evening.      No facility-administered medications prior to visit.      Allergies:   Patient has no known allergies.   Social History   Socioeconomic History  . Marital status: Divorced    Spouse name: Not on file  . Number of children: Not on file  . Years of education: Not on file  . Highest education level: Not on file  Occupational History  . Not on file  Social Needs  . Financial resource strain: Not on file  . Food insecurity:    Worry: Not on file    Inability: Not on file  . Transportation needs:    Medical: Not on file    Non-medical: Not on file  Tobacco Use  . Smoking status: Former Smoker    Packs/day: 2.00    Years: 50.00    Pack years: 100.00    Types: Cigarettes    Last attempt to quit: 01/18/2011    Years since quitting: 6.9  . Smokeless tobacco: Never Used  . Tobacco comment: STATES QUIT SMOKING 01-18-2011  Substance and Sexual Activity  . Alcohol use: Not Currently    Comment: RECOVERING ALCOHOLIC--   QUIT IN 8466  . Drug use: No  . Sexual activity: Never  Lifestyle  . Physical activity:    Days per week: Not on file    Minutes per session: Not on file  . Stress: Not  on file  Relationships  . Social connections:    Talks on phone: Not on file    Gets together: Not on file    Attends religious service: Not on file    Active member of club or organization: Not on file    Attends meetings of clubs or organizations: Not on file    Relationship status: Not on file  Other Topics Concern  . Not on file  Social History Narrative  . Not on file     Family History:  The patient's family history includes Hypertension in her father and mother.   ROS:   Please see the history of present illness.    ROS All other systems reviewed and are negative.   PHYSICAL EXAM:   VS:  There were no vitals taken for this visit.   GENERAL:  Well appearing WF in NAD HEENT:  PERRL, EOMI, sclera are clear. Oropharynx is clear. NECK:  No jugular venous distention, carotid upstroke brisk and symmetric, no bruits, no thyromegaly or adenopathy LUNGS:  Clear to auscultation bilaterally CHEST:  Unremarkable HEART:  RRR,  PMI not displaced or sustained,S1 and S2 within normal limits, no S3, no S4: no clicks, no rubs, no murmurs ABD:  Soft, nontender. BS +, no masses or bruits. No hepatomegaly, no splenomegaly EXT:  2 + pulses throughout, no edema, no cyanosis no clubbing. Left arm is in a cast. SKIN:  Warm and dry.  No rashes NEURO:  Alert and oriented x 3. Cranial nerves II through XII intact. PSYCH:  Cognitively intact    Wt Readings from Last 3 Encounters:  06/28/17 171 lb (77.6 kg)  05/23/17 173 lb 12.8 oz (78.8 kg)  03/30/17 172 lb (78 kg)      Studies/Labs Reviewed:   EKG:  EKG is not ordered today.    Recent Labs: 02/24/2017: B Natriuretic Peptide 274.9; Hemoglobin 15.2; Platelets 295 02/25/2017: BUN 7; Creatinine, Ser 0.88; Potassium 3.9; Sodium 135   Lipid Panel    Component Value Date/Time   CHOL  12/25/2008 0550    161        ATP III CLASSIFICATION:  <200     mg/dL   Desirable  200-239  mg/dL   Borderline High  >=240    mg/dL   High          TRIG 39  12/25/2008 0550   HDL 102 12/25/2008 0550   CHOLHDL 1.6 12/25/2008 0550   VLDL 8 12/25/2008 0550   LDLCALC  12/25/2008 0550    51        Total Cholesterol/HDL:CHD Risk Coronary Heart Disease Risk Table                     Men   Women  1/2 Average Risk   3.4   3.3  Average Risk       5.0   4.4  2 X Average Risk   9.6   7.1  3 X Average Risk  23.4   11.0        Use the calculated Patient Ratio above and the CHD Risk Table to determine the patient's CHD Risk.        ATP III CLASSIFICATION (LDL):  <100     mg/dL   Optimal  100-129  mg/dL   Near or Above                    Optimal  130-159  mg/dL   Borderline  160-189  mg/dL   High  >190     mg/dL   Very High   Dated 05/16/17: cholesterol 163, triglycerides 72, HDL 77, LDL 72. CBC, Chemistries, TSH normal.  Dated 08/22/17: Normal chemistries   Additional studies/ records that were reviewed today include:   Myoview 02/26/2017 IMPRESSION: 1. Fixed defect in the inferolateral wall, likely old infarct. There appears to be some mild peri-infarct ischemia in the adjacent lateral wall.  2. Decreased wall motion and thickening in the inferolateral wall.  3. Left ventricular ejection fraction 51%  4. Non invasive risk stratification*: High   Echo 02/26/2017 LV EF: 45% -   50%  Study Conclusions  - Left ventricle: The cavity size was normal. There was mild   concentric hypertrophy. Systolic function was mildly reduced. The   estimated ejection fraction was in the range of 45% to 50%.   Hypokinesis and scarring of the basal-midinferolateral and   inferior myocardium; consistent with infarction in the   distribution of the right coronary or left circumflex coronary   artery. Doppler parameters are consistent with abnormal left   ventricular relaxation (grade 1 diastolic dysfunction). - Mitral valve: Calcified annulus.  NONINVASIVE PHYSIOLOGIC VASCULAR STUDY OF BILATERAL LOWER EXTREMITIES  TECHNIQUE: Non-invasive  vascular evaluation of both lower extremities was performed at rest, including calculation of ankle-brachial indices, multiple segmental pressure evaluation, segmental Doppler and segmental pulse volume recording.  COMPARISON:  None.  FINDINGS: Right Lower Extremity  Resting ABI:  0.81  Resting TBI: 0.54  Segmental Pressures: Significant pressure gradient between the high and low thigh cuff suggesting outflow (superficial femoral artery) disease. Great toe pressure: 86 mm Hg  Arterial Waveforms: Triphasic arterial waveforms transitioned  biphasic from the popliteal station down.  PVRs: Absent calf augmentation suggesting femoral disease.  Left Lower Extremity:  Resting ABI: 0.69  Resting TBI: 0.51  Segmental Pressures: Significant pressure gradient between the high and low thigh cuff as well as the low thigh cuff and calf cuffs suggesting femoropopliteal disease. Great toe pressure: 82 mm Hg  Arterial Waveforms: Triphasic arterial waveforms transition to biphasic from the popliteal station down.  PVRs: Absent calf augmentation consistent with femoropopliteal disease.  Other: Symmetric upper extremity pressures.  Ankle Brachial index  > 1.4 Non diagnostic secondary to incompressible vessel calcifications  1.0-1.4       Normal  0.9-0.99     Borderline PAD  0.8-0.89     Mild PAD  0.5-0.79     Moderate PAD  < 0.5          Severe PAD  Toe Brachial Index  Normal     >0.65  Moderate  0.53-0.64  Severe     <0.23  Toe Pressures  Absolute toe pressure >20mmHg sufficient for wound healing.  Toe pressures <14mmHg = critical limb ischemia.  IMPRESSION: 1. Resting right ankle brachial index of 0.81 consistent with moderate peripheral arterial disease which appears to localize to the runoff (superficial femoral) distribution. 2. Resting left ankle brachial index of 0.69 consistent with moderate peripheral arterial disease which  appears to localize to the runoff (femoropopliteal) distribution.  Given the clinical history of claudication, recommend referral to Interventional Radiology or other endovascular specialist for further evaluation and management.  Signed,  Criselda Peaches, MD   ASSESSMENT:    No diagnosis found.   PLAN:  In order of problems listed above:  1. Coronary artery disease: Myoview in February was low risk with small inferior wall fixed defect. EF 51%.  Echocardiogram  showed mildly low EF. She is asymptomatic. Will continue current medical therapy.  2. PAD: evaluated by Dr. Oneida Alar. Felt to have noncritical PAD and walking program recommended.   3. Hypertension: Blood pressure well controlled.  4. Hyperlipidemia: On Livalo.  Excellent lipid levels in April.   5.   COPD. On inhaler therapy  6.   Left elbow fracture - seeing ortho this week.    Medication Adjustments/Labs and Tests Ordered: Current medicines are reviewed at length with the patient today.  Concerns regarding medicines are outlined above.  Medication changes, Labs and Tests ordered today are listed in the Patient Instructions below. There are no Patient Instructions on file for this visit.   Signed,  [place], MD  12/26/2017 3:33 PM    [facility] [place], [place], [place]  [address] Phone: [phone]; Fax: [phone]

## 2017-12-28 ENCOUNTER — Ambulatory Visit: Payer: Medicare Other | Admitting: Cardiology

## 2017-12-29 ENCOUNTER — Encounter: Payer: Self-pay | Admitting: *Deleted

## 2017-12-29 NOTE — Progress Notes (Signed)
Cardiology Office Note    Date:  01/02/2018   ID:  [name], DOB 16-Jul-1945, MRN [id number]  PCP:  Harlan Stains, MD  Cardiologist:  Dr. Martinique   Chief Complaint  Patient presents with  . Coronary Artery Disease  . Hypertension  . Shortness of Breath    History of Present Illness:  [name] is a 72 y.o. female with PMH of CAD s/p inferior MI with PCI to RCA 2000, carotid artery stenosis s/p R CEA 1995, renal CA s/p partial L nephrectomy 06/2012, HTN, HLD and COPD.  He had negative Myoview in February 2013.  Echocardiogram in June 2014 showed normal EF.  Carotid ultrasound in April 2017 showed 40-59% left ICA stenosis, less than 40% right ICA stenosis.    She presented to the hospital on 02/24/2017 with chest tightness.  She also complained of shortness of breath with minimal exertion.  Serial troponin was negative.  Patient eventually underwent Myoview on 02/26/2017 which showed EF 51%, fixed defect in inferolateral wall consistent with prior MI, there appears to be some mild peri-infarct ischemia in the adjacent lateral wall, overall considered a high risk study.  Cardiology was not directly consulted at that time.   She has known inferior MI in 2000 which likely resulted in the scar that was seen on the Myoview.  She also had echocardiogram on 02/26/2017 which showed EF 45-50%, hypokinesis and a scarring of basal inferolateral and inferior myocardium consistent with infarction in the RCA, grade 1 DD. She was seen by Almyra Deforest PA-C post hospital and had no recurrent chest pain.  She was later seen by Dr. Oneida Alar for PAD. Noted to have bilateral fem pop disease by doppler in Feb 2019. She was not significantly symptomatic and conservative therapy recommended. Carotid dopplers in June were stable.   On follow up today she reports more SOB and is using her "stronger inhaler" more. No cough or chest pain. No significant claudication but her back pain limits her walking. She complains  that her feet tingle both sides. No palpitations.     Past Medical History:  Diagnosis Date  . Alcoholism ([place])    recovering since 2000  . Anxiety and depression   . Arthritis BACK  . Bipolar disorder ([place])   . Blepharospasm LEFT EYE  . CHF exacerbation ([place]) 02/24/2017  . Chronic back pain   . Coronary artery disease CARDIOLOGIST- DR Martinique--- LAST VISIT NOTE 09-07-2009  W/ CHART  . Emphysema   . History of alcohol abuse RECOVERING SINCE 2000  . History of Left renal mass 03/02/2012   underwent partial nephrectomy  . HTN (hypertension)   . Idiopathic acute facial nerve palsy LEFT SIDE--  BOTOX THERAPY  . Inferior MI ([place]) 2000--  POST PTCA W/ STENT X1  . Osteoporosis   . Other and unspecified general anesthetics causing adverse effect in therapeutic use post op delirium--  last anes record w/ chart  from   09-22-2009 (spinal w/ light sedation)  . Peripheral vascular disease ([place]) POST RIGHT CAROTID SURG.  1995  . Renal cell carcinoma ([place]) 07/09/12   Left mass  . Rosacea LEFT FACIAL RASH  . S/P radiation therapy 02/21/2012   38.75 Gy HDR 5 Fractions- vaginal cuff  . Scoliosis   . Seizures ([place])    x 1 after abrupt discontinuation of  Clonidine  . Status post carotid endarterectomy RIGHT --  1995  . Status post primary angioplasty with coronary stent 2000--  POST INFERIOR MI  .  Unstable balance WALKS W/ CANE  . Vaginal cancer [facility])     Past Surgical History:  Procedure Laterality Date  . CAROTID ENDARTERECTOMY  1995   RIGHT  . CATARACT EXTRACTION W/ INTRAOCULAR LENS  IMPLANT, BILATERAL    . CERVICAL CONIZATION W/BX  09-23-2008  . CORONARY ANGIOPLASTY WITH STENT PLACEMENT  2000-   INFERIOR MI   X1 STENT TO RCA  . EUS N/A 03/05/2012   Procedure: FULL UPPER ENDOSCOPIC ULTRASOUND (EUS) RADIAL and EGD;  Surgeon: Milus Banister, MD;  Location: [facility] END[facility];  Service: Endoscopy;  Laterality: N/A;  ercp scope first than eus scope  . HEMIARTHROPLASTY HIP  12-26-2008   LEFT  FEMORAL NECK FX  . ORIF HIP FRACTURE  02-13-2007   RIGHT FEMORAL NECK FX  . ORIF RIGHT DISTAL RADIUS AND RIGHT PROXIMAL HUMEROUS NECK FX'S  10-10-2005  . RIGHT SHOULDER SURG.  2007  . ROBOTIC ASSITED PARTIAL NEPHRECTOMY Left 07/09/2012   Procedure: ROBOTIC ASSITED PARTIAL NEPHRECTOMY;  Surgeon: Dutch Gray, MD;  Location: [facility] ORS;  Service: Urology;  Laterality: Left;  . TOTAL HIP ARTHROPLASTY  04-15-2008   POST FAILED  RIGHT HIP ORIF FEMORAL FX  . TOTAL KNEE ARTHROPLASTY  09-22-2009   RIGHT  . UPPER RIGHT VAGINAL REGION  12/28/11   BIOPSY: SQUAMOUS CELL CARCINOMA  . VAGINAL HYSTERECTOMY  07/06/2009   Secondary to dysplasia    Current Medications: Outpatient Medications Prior to Visit  Medication Sig Dispense Refill  . albuterol (PROVENTIL HFA;VENTOLIN HFA) 108 (90 BASE) MCG/ACT inhaler Inhale 2 puffs into the lungs every 6 (six) hours as needed for wheezing.    Marland Kitchen amLODipine (NORVASC) 5 MG tablet Take 1 tablet (5 mg total) by mouth daily. 30 tablet 0  . ARIPiprazole (ABILIFY) 20 MG tablet Take 10 mg by mouth every morning.     Marland Kitchen aspirin EC 81 MG tablet Take 81 mg by mouth every morning.     . Calcium Carbonate-Vitamin D 600-400 MG-UNIT per tablet Take 1 tablet by mouth 2 (two) times daily.     . Cholecalciferol (VITAMIN D3) 2000 UNITS TABS Take 2,000 Units by mouth 2 (two) times daily.     . cloNIDine (CATAPRES) 0.2 MG tablet Take 0.2 mg by mouth 2 (two) times daily.     Marland Kitchen dexlansoprazole (DEXILANT) 60 MG capsule Take 60 mg by mouth daily.    Marland Kitchen docusate sodium (COLACE) 100 MG capsule Take 100 mg by mouth as directed.    . furosemide (LASIX) 20 MG tablet Take 20 mg by mouth every morning.    . lamoTRIgine (LAMICTAL) 100 MG tablet Take 200 mg by mouth 2 (two) times daily.     Marland Kitchen losartan (COZAAR) 100 MG tablet Take 100 mg by mouth daily.    . mometasone-formoterol (DULERA) 100-5 MCG/ACT AERO Inhale 2 puffs into the lungs 2 (two) times daily. 1 Inhaler 3  . Multiple Vitamin (MULTIVITAMIN)  capsule Take 1 capsule by mouth every morning.     . nitroGLYCERIN (NITROSTAT) 0.4 MG SL tablet Place 1 tablet (0.4 mg total) under the tongue every 5 (five) minutes as needed for chest pain. 90 tablet 3  . oxyCODONE-acetaminophen (PERCOCET) 10-325 MG per tablet Take 1 tablet by mouth every 4 (four) hours.     . Pitavastatin Calcium (LIVALO) 1 MG TABS Take 1 tablet by mouth daily.    . polyethylene glycol (MIRALAX / GLYCOLAX) packet Take 17 g by mouth daily as needed for mild constipation or moderate constipation.     Marland Kitchen  potassium chloride (KLOR-CON) 8 MEQ tablet Take 8 mEq by mouth every morning.     . sertraline (ZOLOFT) 50 MG tablet Take 50 mg by mouth every morning.     . tiotropium (SPIRIVA) 18 MCG inhalation capsule Place 18 mcg into inhaler and inhale every evening.     . cycloSPORINE (RESTASIS) 0.05 % ophthalmic emulsion Place 1 drop into both eyes 2 (two) times daily.     No facility-administered medications prior to visit.      Allergies:   Patient has no known allergies.   Social History   Socioeconomic History  . Marital status: Divorced    Spouse name: Not on file  . Number of children: Not on file  . Years of education: Not on file  . Highest education level: Not on file  Occupational History  . Not on file  Social Needs  . Financial resource strain: Not on file  . Food insecurity:    Worry: Not on file    Inability: Not on file  . Transportation needs:    Medical: Not on file    Non-medical: Not on file  Tobacco Use  . Smoking status: Former Smoker    Packs/day: 2.00    Years: 50.00    Pack years: 100.00    Types: Cigarettes    Last attempt to quit: 01/18/2011    Years since quitting: 6.9  . Smokeless tobacco: Never Used  . Tobacco comment: STATES QUIT SMOKING 01-18-2011  Substance and Sexual Activity  . Alcohol use: Not Currently    Comment: RECOVERING ALCOHOLIC--   QUIT IN 1540  . Drug use: No  . Sexual activity: Never  Lifestyle  . Physical activity:     Days per week: Not on file    Minutes per session: Not on file  . Stress: Not on file  Relationships  . Social connections:    Talks on phone: Not on file    Gets together: Not on file    Attends religious service: Not on file    Active member of club or organization: Not on file    Attends meetings of clubs or organizations: Not on file    Relationship status: Not on file  Other Topics Concern  . Not on file  Social History Narrative  . Not on file     Family History:  The patient's family history includes Hypertension in her father and mother.   ROS:   Please see the history of present illness.    ROS All other systems reviewed and are negative.   PHYSICAL EXAM:   VS:  BP 124/84 (BP Location: Right Arm, Cuff Size: Normal)   Pulse 65   Ht 5\' 6"  (1.676 m)   Wt 176 lb (79.8 kg)   BMI 28.41 kg/m    GENERAL:  Well appearing WF in NAD HEENT:  PERRL, EOMI, sclera are clear. Oropharynx is clear. NECK:  No jugular venous distention, carotid upstroke brisk and symmetric, no bruits, no thyromegaly or adenopathy LUNGS:  Clear to auscultation bilaterally CHEST:  Unremarkable HEART:  RRR,  PMI not displaced or sustained,S1 and S2 within normal limits, no S3, no S4: no clicks, no rubs, no murmurs ABD:  Soft, nontender. BS +, no masses or bruits. No hepatomegaly, no splenomegaly EXT:  Poor pedal pulses bilaterally but toes and feet are pink without ulceration, no edema, no cyanosis no clubbing. Left arm is in a cast. SKIN:  Warm and dry.  No rashes NEURO:  Alert  and oriented x 3. Cranial nerves II through XII intact. PSYCH:  Cognitively intact    Wt Readings from Last 3 Encounters:  01/02/18 176 lb (79.8 kg)  06/28/17 171 lb (77.6 kg)  05/23/17 173 lb 12.8 oz (78.8 kg)      Studies/Labs Reviewed:   EKG:  EKG is not ordered today.    Recent Labs: 02/24/2017: B Natriuretic Peptide 274.9; Hemoglobin 15.2; Platelets 295 02/25/2017: BUN 7; Creatinine, Ser 0.88; Potassium 3.9;  Sodium 135   Lipid Panel    Component Value Date/Time   CHOL  12/25/2008 0550    161        ATP III CLASSIFICATION:  <200     mg/dL   Desirable  200-239  mg/dL   Borderline High  >=240    mg/dL   High          TRIG 39 12/25/2008 0550   HDL 102 12/25/2008 0550   CHOLHDL 1.6 12/25/2008 0550   VLDL 8 12/25/2008 0550   LDLCALC  12/25/2008 0550    51        Total Cholesterol/HDL:CHD Risk Coronary Heart Disease Risk Table                     Men   Women  1/2 Average Risk   3.4   3.3  Average Risk       5.0   4.4  2 X Average Risk   9.6   7.1  3 X Average Risk  23.4   11.0        Use the calculated Patient Ratio above and the CHD Risk Table to determine the patient's CHD Risk.        ATP III CLASSIFICATION (LDL):  <100     mg/dL   Optimal  100-129  mg/dL   Near or Above                    Optimal  130-159  mg/dL   Borderline  160-189  mg/dL   High  >190     mg/dL   Very High   Dated 05/16/17: cholesterol 163, triglycerides 72, HDL 77, LDL 72. CBC, Chemistries, TSH normal.  Dated 08/22/17: Normal chemistries   Additional studies/ records that were reviewed today include:   Myoview 02/26/2017 IMPRESSION: 1. Fixed defect in the inferolateral wall, likely old infarct. There appears to be some mild peri-infarct ischemia in the adjacent lateral wall.  2. Decreased wall motion and thickening in the inferolateral wall.  3. Left ventricular ejection fraction 51%  4. Non invasive risk stratification*: High   Echo 02/26/2017 LV EF: 45% -   50%  Study Conclusions  - Left ventricle: The cavity size was normal. There was mild   concentric hypertrophy. Systolic function was mildly reduced. The   estimated ejection fraction was in the range of 45% to 50%.   Hypokinesis and scarring of the basal-midinferolateral and   inferior myocardium; consistent with infarction in the   distribution of the right coronary or left circumflex coronary   artery. Doppler parameters are  consistent with abnormal left   ventricular relaxation (grade 1 diastolic dysfunction). - Mitral valve: Calcified annulus.  NONINVASIVE PHYSIOLOGIC VASCULAR STUDY OF BILATERAL LOWER EXTREMITIES  TECHNIQUE: Non-invasive vascular evaluation of both lower extremities was performed at rest, including calculation of ankle-brachial indices, multiple segmental pressure evaluation, segmental Doppler and segmental pulse volume recording.  COMPARISON:  None.  FINDINGS: Right Lower Extremity  Resting ABI:  0.81  Resting TBI: 0.54  Segmental Pressures: Significant pressure gradient between the high and low thigh cuff suggesting outflow (superficial femoral artery) disease. Great toe pressure: 86 mm Hg  Arterial Waveforms: Triphasic arterial waveforms transitioned biphasic from the popliteal station down.  PVRs: Absent calf augmentation suggesting femoral disease.  Left Lower Extremity:  Resting ABI: 0.69  Resting TBI: 0.51  Segmental Pressures: Significant pressure gradient between the high and low thigh cuff as well as the low thigh cuff and calf cuffs suggesting femoropopliteal disease. Great toe pressure: 82 mm Hg  Arterial Waveforms: Triphasic arterial waveforms transition to biphasic from the popliteal station down.  PVRs: Absent calf augmentation consistent with femoropopliteal disease.  Other: Symmetric upper extremity pressures.  Ankle Brachial index  > 1.4 Non diagnostic secondary to incompressible vessel calcifications  1.0-1.4       Normal  0.9-0.99     Borderline PAD  0.8-0.89     Mild PAD  0.5-0.79     Moderate PAD  < 0.5          Severe PAD  Toe Brachial Index  Normal     >0.65  Moderate  0.53-0.64  Severe     <0.23  Toe Pressures  Absolute toe pressure >71mmHg sufficient for wound healing.  Toe pressures <36mmHg = critical limb ischemia.  IMPRESSION: 1. Resting right ankle brachial index of 0.81 consistent  with moderate peripheral arterial disease which appears to localize to the runoff (superficial femoral) distribution. 2. Resting left ankle brachial index of 0.69 consistent with moderate peripheral arterial disease which appears to localize to the runoff (femoropopliteal) distribution.  Given the clinical history of claudication, recommend referral to Interventional Radiology or other endovascular specialist for further evaluation and management.  Signed,  Criselda Peaches, MD   ASSESSMENT:    1. Coronary artery disease involving native coronary artery of native heart without angina pectoris   2. Bilateral carotid artery stenosis   3. PAD (peripheral artery disease) ([place])   4. Essential hypertension      PLAN:  In order of problems listed above:  1. Coronary artery disease: Myoview in February was low risk with small inferior wall fixed defect. EF 51%.  Echocardiogram  showed mildly low EF. She is asymptomatic. Will continue current medical therapy.  2. PAD:  Felt to have noncritical fem pop PAD and walking program recommended. I agree with recommendation. Will repeat arterial dopplers at one year in February. Discussed monitoring her feet daily for any sores.   3. Hypertension: Blood pressure well controlled.  4. Hyperlipidemia: On Livalo.  Excellent lipid levels in April.   5.   COPD. On inhaler therapy  6.   Carotid arterial disease. Repeat dopplers in February.   Follow up in 6 months.    Medication Adjustments/Labs and Tests Ordered: Current medicines are reviewed at length with the patient today.  Concerns regarding medicines are outlined above.  Medication changes, Labs and Tests ordered today are listed in the Patient Instructions below. There are no Patient Instructions on file for this visit.   Signed, Andreu Drudge Martinique, MD  01/02/2018 9:26 AM    [facility] [address], [place], [place]  [address] Phone: [phone]; Fax:  [phone]

## 2018-01-02 ENCOUNTER — Encounter: Payer: Self-pay | Admitting: Cardiology

## 2018-01-02 ENCOUNTER — Ambulatory Visit (INDEPENDENT_AMBULATORY_CARE_PROVIDER_SITE_OTHER): Payer: Medicare Other | Admitting: Cardiology

## 2018-01-02 VITALS — BP 124/84 | HR 65 | Ht 66.0 in | Wt 176.0 lb

## 2018-01-02 DIAGNOSIS — I6523 Occlusion and stenosis of bilateral carotid arteries: Secondary | ICD-10-CM | POA: Diagnosis not present

## 2018-01-02 DIAGNOSIS — I739 Peripheral vascular disease, unspecified: Secondary | ICD-10-CM | POA: Diagnosis not present

## 2018-01-02 DIAGNOSIS — I2510 Atherosclerotic heart disease of native coronary artery without angina pectoris: Secondary | ICD-10-CM | POA: Diagnosis not present

## 2018-01-02 DIAGNOSIS — I10 Essential (primary) hypertension: Secondary | ICD-10-CM | POA: Diagnosis not present

## 2018-01-02 NOTE — Patient Instructions (Signed)
Medication Instructions:  Continue same medications If you need a refill on your cardiac medications before your next appointment, please call your pharmacy.   Lab work: None ordered   Testing/Procedures:  Schedule Carotid and Lower Ext Arterial dopplers in 02/2018  Follow-Up: At [facility], you and your health needs are our priority.  As part of our continuing mission to provide you with exceptional heart care, we have created designated Provider Care Teams.  These Care Teams include your primary Cardiologist (physician) and Advanced Practice Providers (APPs -  Physician Assistants and Nurse Practitioners) who all work together to provide you with the care you need, when you need it. . Follow Up appointment with Dr.Jordan in 6 months Call 2 months before to schedule

## 2018-01-31 DIAGNOSIS — H01004 Unspecified blepharitis left upper eyelid: Secondary | ICD-10-CM | POA: Diagnosis not present

## 2018-01-31 DIAGNOSIS — H01001 Unspecified blepharitis right upper eyelid: Secondary | ICD-10-CM | POA: Diagnosis not present

## 2018-01-31 DIAGNOSIS — H04123 Dry eye syndrome of bilateral lacrimal glands: Secondary | ICD-10-CM | POA: Diagnosis not present

## 2018-01-31 DIAGNOSIS — H01002 Unspecified blepharitis right lower eyelid: Secondary | ICD-10-CM | POA: Diagnosis not present

## 2018-02-07 DIAGNOSIS — J449 Chronic obstructive pulmonary disease, unspecified: Secondary | ICD-10-CM | POA: Diagnosis not present

## 2018-02-07 DIAGNOSIS — E785 Hyperlipidemia, unspecified: Secondary | ICD-10-CM | POA: Diagnosis not present

## 2018-02-07 DIAGNOSIS — Z79899 Other long term (current) drug therapy: Secondary | ICD-10-CM | POA: Diagnosis not present

## 2018-02-07 DIAGNOSIS — Z6832 Body mass index (BMI) 32.0-32.9, adult: Secondary | ICD-10-CM | POA: Diagnosis not present

## 2018-02-07 DIAGNOSIS — M5030 Other cervical disc degeneration, unspecified cervical region: Secondary | ICD-10-CM | POA: Diagnosis not present

## 2018-02-07 DIAGNOSIS — R7309 Other abnormal glucose: Secondary | ICD-10-CM | POA: Diagnosis not present

## 2018-02-07 DIAGNOSIS — I739 Peripheral vascular disease, unspecified: Secondary | ICD-10-CM | POA: Diagnosis not present

## 2018-02-07 DIAGNOSIS — I10 Essential (primary) hypertension: Secondary | ICD-10-CM | POA: Diagnosis not present

## 2018-02-07 DIAGNOSIS — R0989 Other specified symptoms and signs involving the circulatory and respiratory systems: Secondary | ICD-10-CM | POA: Diagnosis not present

## 2018-02-07 DIAGNOSIS — F319 Bipolar disorder, unspecified: Secondary | ICD-10-CM | POA: Diagnosis not present

## 2018-02-12 ENCOUNTER — Other Ambulatory Visit (HOSPITAL_COMMUNITY): Payer: Self-pay | Admitting: Cardiology

## 2018-02-12 DIAGNOSIS — I739 Peripheral vascular disease, unspecified: Secondary | ICD-10-CM

## 2018-02-28 ENCOUNTER — Ambulatory Visit (HOSPITAL_COMMUNITY)
Admission: RE | Admit: 2018-02-28 | Payer: Medicare Other | Source: Ambulatory Visit | Attending: Cardiology | Admitting: Cardiology

## 2018-02-28 ENCOUNTER — Ambulatory Visit (HOSPITAL_COMMUNITY)
Admission: RE | Admit: 2018-02-28 | Discharge: 2018-02-28 | Disposition: A | Discharge status: Home / Self Care | Payer: Medicare Other | Source: Ambulatory Visit | Attending: Cardiovascular Disease | Admitting: Cardiovascular Disease

## 2018-02-28 DIAGNOSIS — I2510 Atherosclerotic heart disease of native coronary artery without angina pectoris: Secondary | ICD-10-CM | POA: Diagnosis not present

## 2018-02-28 DIAGNOSIS — I739 Peripheral vascular disease, unspecified: Secondary | ICD-10-CM | POA: Diagnosis not present

## 2018-02-28 DIAGNOSIS — I10 Essential (primary) hypertension: Secondary | ICD-10-CM | POA: Insufficient documentation

## 2018-02-28 DIAGNOSIS — I6523 Occlusion and stenosis of bilateral carotid arteries: Secondary | ICD-10-CM | POA: Insufficient documentation

## 2018-03-05 DIAGNOSIS — G518 Other disorders of facial nerve: Secondary | ICD-10-CM | POA: Diagnosis not present

## 2018-03-07 DIAGNOSIS — M545 Low back pain: Secondary | ICD-10-CM | POA: Diagnosis not present

## 2018-03-07 DIAGNOSIS — G8929 Other chronic pain: Secondary | ICD-10-CM | POA: Diagnosis not present

## 2018-03-07 DIAGNOSIS — M5030 Other cervical disc degeneration, unspecified cervical region: Secondary | ICD-10-CM | POA: Diagnosis not present

## 2018-03-07 DIAGNOSIS — Z79891 Long term (current) use of opiate analgesic: Secondary | ICD-10-CM | POA: Diagnosis not present

## 2018-03-07 DIAGNOSIS — M5136 Other intervertebral disc degeneration, lumbar region: Secondary | ICD-10-CM | POA: Diagnosis not present

## 2018-03-14 ENCOUNTER — Telehealth: Payer: Self-pay | Admitting: *Deleted

## 2018-03-14 DIAGNOSIS — G518 Other disorders of facial nerve: Secondary | ICD-10-CM | POA: Diagnosis not present

## 2018-03-14 NOTE — Telephone Encounter (Signed)
Returned the patient's call and rescheduled her from Dr. Gerarda Fraction to Dr. Fermin Schwab on May 12th

## 2018-03-23 DIAGNOSIS — H02233 Paralytic lagophthalmos right eye, unspecified eyelid: Secondary | ICD-10-CM | POA: Diagnosis not present

## 2018-04-12 ENCOUNTER — Ambulatory Visit
Admission: RE | Admit: 2018-04-12 | Discharge: 2018-04-12 | Disposition: A | Discharge status: Home / Self Care | Payer: Medicare Other | Source: Ambulatory Visit | Attending: Family Medicine | Admitting: Family Medicine

## 2018-04-12 ENCOUNTER — Other Ambulatory Visit: Payer: Self-pay

## 2018-04-12 DIAGNOSIS — N6320 Unspecified lump in the left breast, unspecified quadrant: Secondary | ICD-10-CM | POA: Diagnosis not present

## 2018-04-12 DIAGNOSIS — N63 Unspecified lump in unspecified breast: Secondary | ICD-10-CM

## 2018-04-12 DIAGNOSIS — N6321 Unspecified lump in the left breast, upper outer quadrant: Secondary | ICD-10-CM | POA: Diagnosis not present

## 2018-05-18 ENCOUNTER — Telehealth: Payer: Self-pay | Admitting: *Deleted

## 2018-05-18 NOTE — Telephone Encounter (Signed)
Called and left the patient a message to call the office back. Need to make changes to the appt.

## 2018-05-22 DIAGNOSIS — F319 Bipolar disorder, unspecified: Secondary | ICD-10-CM | POA: Diagnosis not present

## 2018-05-22 DIAGNOSIS — M5030 Other cervical disc degeneration, unspecified cervical region: Secondary | ICD-10-CM | POA: Diagnosis not present

## 2018-05-22 DIAGNOSIS — E785 Hyperlipidemia, unspecified: Secondary | ICD-10-CM | POA: Diagnosis not present

## 2018-05-22 DIAGNOSIS — I10 Essential (primary) hypertension: Secondary | ICD-10-CM | POA: Diagnosis not present

## 2018-05-22 DIAGNOSIS — J449 Chronic obstructive pulmonary disease, unspecified: Secondary | ICD-10-CM | POA: Diagnosis not present

## 2018-05-29 ENCOUNTER — Ambulatory Visit: Payer: Medicare Other | Admitting: Gynecology

## 2018-05-30 ENCOUNTER — Ambulatory Visit: Payer: Medicare Other | Admitting: Obstetrics

## 2018-06-05 DIAGNOSIS — G518 Other disorders of facial nerve: Secondary | ICD-10-CM | POA: Diagnosis not present

## 2018-06-19 ENCOUNTER — Telehealth: Payer: Self-pay | Admitting: Cardiology

## 2018-06-19 NOTE — Telephone Encounter (Signed)
LVM for patient to call with preference of video or phone visit.

## 2018-06-21 ENCOUNTER — Other Ambulatory Visit (HOSPITAL_COMMUNITY): Payer: Self-pay | Admitting: Cardiology

## 2018-06-21 ENCOUNTER — Other Ambulatory Visit: Payer: Self-pay

## 2018-06-21 ENCOUNTER — Ambulatory Visit (HOSPITAL_COMMUNITY)
Admission: RE | Admit: 2018-06-21 | Discharge: 2018-06-21 | Disposition: A | Discharge status: Home / Self Care | Payer: Medicare Other | Source: Ambulatory Visit | Attending: Cardiology | Admitting: Cardiology

## 2018-06-21 DIAGNOSIS — I6523 Occlusion and stenosis of bilateral carotid arteries: Secondary | ICD-10-CM

## 2018-06-21 DIAGNOSIS — Z9889 Other specified postprocedural states: Secondary | ICD-10-CM

## 2018-06-22 ENCOUNTER — Telehealth: Payer: Self-pay

## 2018-06-22 DIAGNOSIS — I6523 Occlusion and stenosis of bilateral carotid arteries: Secondary | ICD-10-CM

## 2018-06-22 NOTE — Telephone Encounter (Signed)
Spoke to patient carotid doppler results given.Stated she would like appointment to recheck in 1 year.Scheduler will call back with appointment.

## 2018-06-25 ENCOUNTER — Encounter (HOSPITAL_COMMUNITY): Payer: Medicare Other

## 2018-06-26 ENCOUNTER — Telehealth: Payer: Self-pay | Admitting: Cardiology

## 2018-06-26 NOTE — Telephone Encounter (Signed)
call home phone/ consent/ my chart/ pre reg completed °

## 2018-06-27 ENCOUNTER — Telehealth: Payer: Self-pay | Admitting: *Deleted

## 2018-06-27 NOTE — Telephone Encounter (Signed)
Called and spoke with the patient, scheduled a follow up appt for 7/8. Explained that we may have to move the appt.

## 2018-06-28 NOTE — Progress Notes (Signed)
Virtual Visit via Telephone Note   This visit type was conducted due to national recommendations for restrictions regarding the COVID-19 Pandemic (e.g. social distancing) in an effort to limit this patient's exposure and mitigate transmission in our community.  Due to her co-morbid illnesses, this patient is at least at moderate risk for complications without adequate follow up.  This format is felt to be most appropriate for this patient at this time.  The patient did not have access to video technology/had technical difficulties with video requiring transitioning to audio format only (telephone).  All issues noted in this document were discussed and addressed.  No physical exam could be performed with this format.  Please refer to the patient's chart for her  consent to telehealth for [facility].   Date:  07/02/2018   ID:  [name], DOB August 06, 1945, MRN [id number]  Patient Location: Home Provider Location: Home  PCP:  Harlan Stains, MD  Cardiologist:  Anniston Nellums Martinique, MD  Electrophysiologist:  None   Evaluation Performed:  Follow-Up Visit  Chief Complaint:  Follow up CAD  History of Present Illness:    [name] is a 73 y.o. female with PMH of CAD s/p inferior MI with PCI to RCA 2000, carotid artery stenosis s/p R CEA 1995, renal CA s/p partial L nephrectomy 06/2012, HTN, HLD and COPD.  She had negative Myoview in February 2013.  Echocardiogram in June 2014 showed normal EF.  Carotid ultrasound in April 2017 showed 40-59% left ICA stenosis, less than 40% right ICA stenosis.    She presented to the hospital on 02/24/2017 with chest tightness.  She also complained of shortness of breath with minimal exertion.  Serial troponin was negative.  Patient eventually underwent Myoview on 02/26/2017 which showed EF 51%, fixed defect in inferolateral wall consistent with prior MI, there appears to be some mild peri-infarct ischemia in the adjacent lateral wall, overall considered a high  risk study.  Cardiology was not directly consulted at that time.   She has known inferior MI in 2000 which likely resulted in the scar that was seen on the Myoview.  She also had echocardiogram on 02/26/2017 which showed EF 45-50%, hypokinesis and a scarring of basal inferolateral and inferior myocardium consistent with infarction in the RCA, grade 1 DD. She was seen by Almyra Deforest PA-C post hospital and had no recurrent chest pain.  She was later seen by Dr. Oneida Alar for PAD. Noted to have bilateral fem pop disease by doppler in Feb 2019. She was not significantly symptomatic and conservative therapy recommended. Repeat dopplers in February 2020 unchanged. Carotid dopplers in June 2020 were stable with < 39%.   On follow up today she denies any chest pain. States breathing is a little worse over the weekend and she uses her rescue inhaler. BP is typically very well controlled at 117/70. Thinks it up today because she is having a lot of muscle spasms in her neck and didn't sleep well last night.   The patient does not have symptoms concerning for COVID-19 infection (fever, chills, cough, or new shortness of breath).    Past Medical History:  Diagnosis Date  . Alcoholism ([place])    recovering since 2000  . Anxiety and depression   . Arthritis BACK  . Bipolar disorder ([place])   . Blepharospasm LEFT EYE  . CHF exacerbation ([place]) 02/24/2017  . Chronic back pain   . Coronary artery disease CARDIOLOGIST- DR Martinique--- LAST VISIT NOTE 09-07-2009  W/ CHART  .  Emphysema   . History of alcohol abuse RECOVERING SINCE 2000  . History of Left renal mass 03/02/2012   underwent partial nephrectomy  . HTN (hypertension)   . Idiopathic acute facial nerve palsy LEFT SIDE--  BOTOX THERAPY  . Inferior MI ([place]) 2000--  POST PTCA W/ STENT X1  . Osteoporosis   . Other and unspecified general anesthetics causing adverse effect in therapeutic use post op delirium--  last anes record w/ chart  from   09-22-2009 (spinal w/  light sedation)  . Peripheral vascular disease ([place]) POST RIGHT CAROTID SURG.  1995  . Renal cell carcinoma ([place]) 07/09/12   Left mass  . Rosacea LEFT FACIAL RASH  . S/P radiation therapy 02/21/2012   38.75 Gy HDR 5 Fractions- vaginal cuff  . Scoliosis   . Seizures ([place])    x 1 after abrupt discontinuation of  Clonidine  . Status post carotid endarterectomy RIGHT --  1995  . Status post primary angioplasty with coronary stent 2000--  POST INFERIOR MI  . Unstable balance WALKS W/ CANE  . Vaginal cancer [facility])    Past Surgical History:  Procedure Laterality Date  . CAROTID ENDARTERECTOMY  1995   RIGHT  . CATARACT EXTRACTION W/ INTRAOCULAR LENS  IMPLANT, BILATERAL    . CERVICAL CONIZATION W/BX  09-23-2008  . CORONARY ANGIOPLASTY WITH STENT PLACEMENT  2000-   INFERIOR MI   X1 STENT TO RCA  . EUS N/A 03/05/2012   Procedure: FULL UPPER ENDOSCOPIC ULTRASOUND (EUS) RADIAL and EGD;  Surgeon: Milus Banister, MD;  Location: [facility] END[facility];  Service: Endoscopy;  Laterality: N/A;  ercp scope first than eus scope  . HEMIARTHROPLASTY HIP  12-26-2008   LEFT FEMORAL NECK FX  . ORIF HIP FRACTURE  02-13-2007   RIGHT FEMORAL NECK FX  . ORIF RIGHT DISTAL RADIUS AND RIGHT PROXIMAL HUMEROUS NECK FX'S  10-10-2005  . RIGHT SHOULDER SURG.  2007  . ROBOTIC ASSITED PARTIAL NEPHRECTOMY Left 07/09/2012   Procedure: ROBOTIC ASSITED PARTIAL NEPHRECTOMY;  Surgeon: Dutch Gray, MD;  Location: [facility] ORS;  Service: Urology;  Laterality: Left;  . TOTAL HIP ARTHROPLASTY  04-15-2008   POST FAILED  RIGHT HIP ORIF FEMORAL FX  . TOTAL KNEE ARTHROPLASTY  09-22-2009   RIGHT  . UPPER RIGHT VAGINAL REGION  12/28/11   BIOPSY: SQUAMOUS CELL CARCINOMA  . VAGINAL HYSTERECTOMY  07/06/2009   Secondary to dysplasia     Current Meds  Medication Sig  . albuterol (PROVENTIL HFA;VENTOLIN HFA) 108 (90 BASE) MCG/ACT inhaler Inhale 2 puffs into the lungs every 6 (six) hours as needed for wheezing.  Marland Kitchen amLODipine (NORVASC) 5 MG tablet Take 1  tablet (5 mg total) by mouth daily.  . ARIPiprazole (ABILIFY) 20 MG tablet Take 10 mg by mouth every morning.   Marland Kitchen aspirin EC 81 MG tablet Take 81 mg by mouth every morning.   . Calcium Carbonate-Vitamin D 600-400 MG-UNIT per tablet Take 1 tablet by mouth 2 (two) times daily.   . Cholecalciferol (VITAMIN D3) 2000 UNITS TABS Take 2,000 Units by mouth 2 (two) times daily.   . cloNIDine (CATAPRES) 0.2 MG tablet Take 0.2 mg by mouth 2 (two) times daily.   Marland Kitchen dexlansoprazole (DEXILANT) 60 MG capsule Take 60 mg by mouth daily.  Marland Kitchen docusate sodium (COLACE) 100 MG capsule Take 100 mg by mouth as directed.  . furosemide (LASIX) 20 MG tablet Take 20 mg by mouth every morning.  . lamoTRIgine (LAMICTAL) 100 MG tablet Take 200 mg by  mouth 2 (two) times daily.   Marland Kitchen losartan (COZAAR) 100 MG tablet Take 100 mg by mouth daily.  . mometasone-formoterol (DULERA) 100-5 MCG/ACT AERO Inhale 2 puffs into the lungs 2 (two) times daily.  . Multiple Vitamin (MULTIVITAMIN) capsule Take 1 capsule by mouth every morning.   . nitroGLYCERIN (NITROSTAT) 0.4 MG SL tablet Place 1 tablet (0.4 mg total) under the tongue every 5 (five) minutes as needed for chest pain.  Marland Kitchen oxyCODONE-acetaminophen (PERCOCET) 10-325 MG per tablet Take 1 tablet by mouth every 4 (four) hours.   . Pitavastatin Calcium (LIVALO) 1 MG TABS Take 1 tablet by mouth daily.  . polyethylene glycol (MIRALAX / GLYCOLAX) packet Take 17 g by mouth daily as needed for mild constipation or moderate constipation.   . potassium chloride (KLOR-CON) 8 MEQ tablet Take 8 mEq by mouth every morning.   . sertraline (ZOLOFT) 50 MG tablet Take 50 mg by mouth every morning.   . tiotropium (SPIRIVA) 18 MCG inhalation capsule Place 18 mcg into inhaler and inhale every evening.      Allergies:   Patient has no known allergies.   Social History   Tobacco Use  . Smoking status: Former Smoker    Packs/day: 2.00    Years: 50.00    Pack years: 100.00    Types: Cigarettes    Quit  date: 01/18/2011    Years since quitting: 7.4  . Smokeless tobacco: Never Used  . Tobacco comment: STATES QUIT SMOKING 01-18-2011  Substance Use Topics  . Alcohol use: Not Currently    Comment: RECOVERING ALCOHOLIC--   QUIT IN 3149  . Drug use: No     Family Hx: The patient's family history includes Hypertension in her father and mother.  ROS:   Please see the history of present illness.    All other systems reviewed and are negative.   Prior CV studies:   The following studies were reviewed today:  Myoview 02/26/2017 IMPRESSION: 1. Fixed defect in the inferolateral wall, likely old infarct. There appears to be some mild peri-infarct ischemia in the adjacent lateral wall.  2. Decreased wall motion and thickening in the inferolateral wall.  3. Left ventricular ejection fraction 51%  4. Non invasive risk stratification*: High   Echo 02/26/2017 LV EF: 45% - 50%  Study Conclusions  - Left ventricle: The cavity size was normal. There was mild concentric hypertrophy. Systolic function was mildly reduced. The estimated ejection fraction was in the range of 45% to 50%. Hypokinesis and scarring of the basal-midinferolateral and inferior myocardium; consistent with infarction in the distribution of the right coronary or left circumflex coronary artery. Doppler parameters are consistent with abnormal left ventricular relaxation (grade 1 diastolic dysfunction). - Mitral valve: Calcified annulus.  Labs/Other Tests and Data Reviewed:    EKG:  No ECG reviewed.  Recent Labs: No results found for requested labs within last 8760 hours.   Recent Lipid Panel Lab Results  Component Value Date/Time   CHOL  12/25/2008 05:50 AM    161        ATP III CLASSIFICATION:  <200     mg/dL   Desirable  200-239  mg/dL   Borderline High  >=240    mg/dL   High          TRIG 39 12/25/2008 05:50 AM   HDL 102 12/25/2008 05:50 AM   CHOLHDL 1.6 12/25/2008 05:50 AM    LDLCALC  12/25/2008 05:50 AM    51  Total Cholesterol/HDL:CHD Risk Coronary Heart Disease Risk Table                     Men   Women  1/2 Average Risk   3.4   3.3  Average Risk       5.0   4.4  2 X Average Risk   9.6   7.1  3 X Average Risk  23.4   11.0        Use the calculated Patient Ratio above and the CHD Risk Table to determine the patient's CHD Risk.        ATP III CLASSIFICATION (LDL):  <100     mg/dL   Optimal  100-129  mg/dL   Near or Above                    Optimal  130-159  mg/dL   Borderline  160-189  mg/dL   High  >190     mg/dL   Very High   Dated 02/07/18: cholesterol 155, triglycerides 39, HDL 86, LDL 62, A1c 5.7. CMET, CBC, TFTS normal.    Wt Readings from Last 3 Encounters:  07/02/18 172 lb (78 kg)  01/02/18 176 lb (79.8 kg)  06/28/17 171 lb (77.6 kg)     Objective:    Vital Signs:  BP (!) 186/91   Pulse (!) 57   Temp 98 F (36.7 C)   Ht 5\' 6"  (1.676 m)   Wt 172 lb (78 kg)   BMI 27.76 kg/m    VITAL SIGNS:  reviewed  ASSESSMENT & PLAN:    1. Coronary artery disease: Myoview in February was low risk with small inferior wall fixed defect. EF 51%.  Echocardiogram  showed mildly low EF. She is asymptomatic. Will continue current medical therapy.  2. PAD:  Felt to have noncritical fem pop PAD and walking program recommended.  Will repeat arterial dopplers at one year in February. Discussed monitoring her feet daily for any sores. she has no claudication symptoms today.  3. Hypertension: Blood pressure is generally well controlled.  4. Hyperlipidemia: On L[place].  Excellent lipid levels in April.   5.   COPD. On inhaler therapy  6.   Carotid arterial disease. Repeat dopplers in February showed mild stenosis.   COVID-19 Education: The signs and symptoms of COVID-19 were discussed with the patient and how to seek care for testing (follow up with PCP or arrange E-visit).  The importance of social distancing was discussed today.  Time:    Today, I have spent 10 minutes with the patient with telehealth technology discussing the above problems.     Medication Adjustments/Labs and Tests Ordered: Current medicines are reviewed at length with the patient today.  Concerns regarding medicines are outlined above.   Tests Ordered: No orders of the defined types were placed in this encounter.   Medication Changes: No orders of the defined types were placed in this encounter.   Disposition:  Follow up in 6 month(s)  Signed, Elford Evilsizer Martinique, MD  07/02/2018 9:03 AM    [facility]

## 2018-07-02 ENCOUNTER — Encounter: Payer: Self-pay | Admitting: Cardiology

## 2018-07-02 ENCOUNTER — Telehealth (INDEPENDENT_AMBULATORY_CARE_PROVIDER_SITE_OTHER): Payer: Medicare Other | Admitting: Cardiology

## 2018-07-02 VITALS — BP 186/91 | HR 57 | Temp 98.0°F | Ht 66.0 in | Wt 172.0 lb

## 2018-07-02 DIAGNOSIS — I2510 Atherosclerotic heart disease of native coronary artery without angina pectoris: Secondary | ICD-10-CM

## 2018-07-02 DIAGNOSIS — I10 Essential (primary) hypertension: Secondary | ICD-10-CM

## 2018-07-02 DIAGNOSIS — E785 Hyperlipidemia, unspecified: Secondary | ICD-10-CM

## 2018-07-02 DIAGNOSIS — I739 Peripheral vascular disease, unspecified: Secondary | ICD-10-CM

## 2018-07-02 DIAGNOSIS — I6523 Occlusion and stenosis of bilateral carotid arteries: Secondary | ICD-10-CM

## 2018-07-02 NOTE — Patient Instructions (Signed)
Medication Instructions:  Continue same medications If you need a refill on your cardiac medications before your next appointment, please call your pharmacy.   Lab work: None ordered   Testing/Procedures: None ordered  Follow-Up: At [facility], you and your health needs are our priority.  As part of our continuing mission to provide you with exceptional heart care, we have created designated Provider Care Teams.  These Care Teams include your primary Cardiologist (physician) and Advanced Practice Providers (APPs -  Physician Assistants and Nurse Practitioners) who all work together to provide you with the care you need, when you need it. . Schedule follow up appointment in 6 months  Call 3 months before to schedule   

## 2018-07-04 DIAGNOSIS — M62830 Muscle spasm of back: Secondary | ICD-10-CM | POA: Diagnosis not present

## 2018-07-25 ENCOUNTER — Ambulatory Visit: Payer: Medicare Other | Admitting: Gynecology

## 2018-08-08 DIAGNOSIS — H01004 Unspecified blepharitis left upper eyelid: Secondary | ICD-10-CM | POA: Diagnosis not present

## 2018-08-08 DIAGNOSIS — H04123 Dry eye syndrome of bilateral lacrimal glands: Secondary | ICD-10-CM | POA: Diagnosis not present

## 2018-08-08 DIAGNOSIS — H01001 Unspecified blepharitis right upper eyelid: Secondary | ICD-10-CM | POA: Diagnosis not present

## 2018-08-08 DIAGNOSIS — H01002 Unspecified blepharitis right lower eyelid: Secondary | ICD-10-CM | POA: Diagnosis not present

## 2018-08-13 ENCOUNTER — Telehealth: Payer: Self-pay | Admitting: *Deleted

## 2018-08-13 NOTE — Telephone Encounter (Signed)
Called and moved the patient's appt for tomorrow to 12:15

## 2018-08-13 NOTE — Progress Notes (Signed)
Consult Note: Gyn-Onc  CC:  Chief Complaint  Patient presents with  . Follow-up    HPI:  [name] is a very pleasant 73 y.o. P0  She had a 10 year history of abnormal Pap smears. In April 2011 she had a Pap smear revealing ASC-H. She was worked up and evaluated and subsequently underwent a vaginal hysterectomy in June of 2011. Pathology at that time revealed the cervix with high-grade squamous dysplasia. Focal adenomyosis. In addition, the ectocervical margin of the cervix was positive.   She was seen by Dr. Deatra Ina on November 14, 2009 at which time a Pap smear revealed VAIN2/VAIN 3. She underwent laser ablation on for dysplasia on February 07, 2010. Operative findings included a raised hyperkeratotic lesion in the vagina encompassing the entire vaginal cuff. The area was acetowhite changing. The total area measured approximately 4 x 4 centimeters.  Dr. Alycia Rossetti saw her in 2013 and her Pap smear was normal. She subsequently saw Dr. Deatra Ina November 16, 2011 and her Pap smear returned as ASCUS with positive high-risk HPV.   Dr. Alycia Rossetti saw her in late 2013 noting acetowhite epithelial changes at the top of the vagina. However, there was a punctate area with telangiectasias in the upper right vaginal fornix. A biopsy of this was performed. It revealed a squamous cell carcinoma.   She completed 5 fractions of HDR with Dr. Lanell Persons in 02/2012.   She was ultimately diagnosed with a renal cell carcinoma and is status post a partial left nephrectomy 07/09/2012. The pathology revealed a T1-1 grade 2 clear cell renal cell carcinoma. The margins were negative and she was dispositioned to close followup.   Interval History: She was last seen by Dr. Gerarda Fraction 05/2017 and her pap smear was negative.  She is overall doing quite well.  The biggest issue that she continues having is her back pain secondary to her scoliosis.  She does use her walker full-time.  Other than going to the grocery store and doctor's  visit she is really not getting out very much secondary to the [place] pandemic.  Review of Systems  Constitutional: Denies fever. Cardiovascular: No chest pain, shortness of breath, + edema RLE, no change Pulmonary: No cough   Gastro Intestinal:  No nausea, vomiting, constipation, or diarrhea reported. Genitourinary: Denies vaginal bleeding and discharge.  Musculoskeletal: + back pain due to scoliosis Neurologic: No change in gait. Using her walker full time Psychology: No changes  Current Meds:  Outpatient Encounter Medications as of 08/14/2018  Medication Sig  . albuterol (PROVENTIL HFA;VENTOLIN HFA) 108 (90 BASE) MCG/ACT inhaler Inhale 2 puffs into the lungs every 6 (six) hours as needed for wheezing.  Marland Kitchen amLODipine (NORVASC) 5 MG tablet Take 1 tablet (5 mg total) by mouth daily.  . ARIPiprazole (ABILIFY) 20 MG tablet Take 10 mg by mouth every morning.   Marland Kitchen aspirin EC 81 MG tablet Take 81 mg by mouth every morning.   . Calcium Carbonate-Vitamin D 600-400 MG-UNIT per tablet Take 1 tablet by mouth 2 (two) times daily.   . Cholecalciferol (VITAMIN D3) 2000 UNITS TABS Take 2,000 Units by mouth 2 (two) times daily.   . cloNIDine (CATAPRES) 0.2 MG tablet Take 0.2 mg by mouth 2 (two) times daily.   Marland Kitchen dexlansoprazole (DEXILANT) 60 MG capsule Take 60 mg by mouth daily.  Marland Kitchen docusate sodium (COLACE) 100 MG capsule Take 100 mg by mouth as directed.  . furosemide (LASIX) 20 MG tablet Take 20 mg by mouth every morning.  Marland Kitchen  lamoTRIgine (LAMICTAL) 100 MG tablet Take 200 mg by mouth 2 (two) times daily.   Marland Kitchen losartan (COZAAR) 100 MG tablet Take 100 mg by mouth daily.  . mometasone-formoterol (DULERA) 100-5 MCG/ACT AERO Inhale 2 puffs into the lungs 2 (two) times daily.  . Multiple Vitamin (MULTIVITAMIN) capsule Take 1 capsule by mouth every morning.   . nitroGLYCERIN (NITROSTAT) 0.4 MG SL tablet Place 1 tablet (0.4 mg total) under the tongue every 5 (five) minutes as needed for chest pain.  Marland Kitchen  oxyCODONE-acetaminophen (PERCOCET) 10-325 MG per tablet Take 1 tablet by mouth every 4 (four) hours.   . Pitavastatin Calcium (LIVALO) 1 MG TABS Take 1 tablet by mouth daily.  . polyethylene glycol (MIRALAX / GLYCOLAX) packet Take 17 g by mouth daily as needed for mild constipation or moderate constipation.   . potassium chloride (KLOR-CON) 8 MEQ tablet Take 8 mEq by mouth every morning.   . sertraline (ZOLOFT) 50 MG tablet Take 50 mg by mouth every morning.   . tiotropium (SPIRIVA) 18 MCG inhalation capsule Place 18 mcg into inhaler and inhale every evening.    No facility-administered encounter medications on file as of 08/14/2018.     Allergy: No Known Allergies  Social Hx:   Social History   Socioeconomic History  . Marital status: Divorced    Spouse name: Not on file  . Number of children: Not on file  . Years of education: Not on file  . Highest education level: Not on file  Occupational History  . Not on file  Social Needs  . Financial resource strain: Not on file  . Food insecurity    Worry: Not on file    Inability: Not on file  . Transportation needs    Medical: Not on file    Non-medical: Not on file  Tobacco Use  . Smoking status: Former Smoker    Packs/day: 2.00    Years: 50.00    Pack years: 100.00    Types: Cigarettes    Quit date: 01/18/2011    Years since quitting: 7.5  . Smokeless tobacco: Never Used  . Tobacco comment: STATES QUIT SMOKING 01-18-2011  Substance and Sexual Activity  . Alcohol use: Not Currently    Comment: RECOVERING ALCOHOLIC--   QUIT IN 1779  . Drug use: No  . Sexual activity: Never  Lifestyle  . Physical activity    Days per week: Not on file    Minutes per session: Not on file  . Stress: Not on file  Relationships  . Social Herbalist on phone: Not on file    Gets together: Not on file    Attends religious service: Not on file    Active member of club or organization: Not on file    Attends meetings of clubs or  organizations: Not on file    Relationship status: Not on file  . Intimate partner violence    Fear of current or ex partner: Not on file    Emotionally abused: Not on file    Physically abused: Not on file    Forced sexual activity: Not on file  Other Topics Concern  . Not on file  Social History Narrative  . Not on file    Past Surgical Hx:  Past Surgical History:  Procedure Laterality Date  . CAROTID ENDARTERECTOMY  1995   RIGHT  . CATARACT EXTRACTION W/ INTRAOCULAR LENS  IMPLANT, BILATERAL    . CERVICAL CONIZATION W/BX  09-23-2008  .  CORONARY ANGIOPLASTY WITH STENT PLACEMENT  2000-   INFERIOR MI   X1 STENT TO RCA  . EUS N/A 03/05/2012   Procedure: FULL UPPER ENDOSCOPIC ULTRASOUND (EUS) RADIAL and EGD;  Surgeon: Milus Banister, MD;  Location: [facility];  Service: Endoscopy;  Laterality: N/A;  ercp scope first than eus scope  . HEMIARTHROPLASTY HIP  12-26-2008   LEFT FEMORAL NECK FX  . ORIF HIP FRACTURE  02-13-2007   RIGHT FEMORAL NECK FX  . ORIF RIGHT DISTAL RADIUS AND RIGHT PROXIMAL HUMEROUS NECK FX'S  10-10-2005  . RIGHT SHOULDER SURG.  2007  . ROBOTIC ASSITED PARTIAL NEPHRECTOMY Left 07/09/2012   Procedure: ROBOTIC ASSITED PARTIAL NEPHRECTOMY;  Surgeon: Dutch Gray, MD;  Location: [facility];  Service: Urology;  Laterality: Left;  . TOTAL HIP ARTHROPLASTY  04-15-2008   POST FAILED  RIGHT HIP ORIF FEMORAL FX  . TOTAL KNEE ARTHROPLASTY  09-22-2009   RIGHT  . UPPER RIGHT VAGINAL REGION  12/28/11   BIOPSY: SQUAMOUS CELL CARCINOMA  . VAGINAL HYSTERECTOMY  07/06/2009   Secondary to dysplasia    Past Medical Hx:  Past Medical History:  Diagnosis Date  . Alcoholism ([place])    recovering since 2000  . Anxiety and depression   . Arthritis BACK  . Bipolar disorder ([place])   . Blepharospasm LEFT EYE  . CHF exacerbation ([place]) 02/24/2017  . Chronic back pain   . Coronary artery disease CARDIOLOGIST- DR [place]--- LAST VISIT NOTE 09-07-2009  W/ CHART  . Emphysema   . History of  alcohol abuse RECOVERING SINCE 2000  . History of Left renal mass 03/02/2012   underwent partial nephrectomy  . HTN (hypertension)   . Idiopathic acute facial nerve palsy LEFT SIDE--  BOTOX THERAPY  . Inferior MI ([place]) 2000--  POST PTCA W/ STENT X1  . Osteoporosis   . Other and unspecified general anesthetics causing adverse effect in therapeutic use post op delirium--  last anes record w/ chart  from   09-22-2009 (spinal w/ light sedation)  . Peripheral vascular disease ([place]) POST RIGHT CAROTID SURG.  1995  . Renal cell carcinoma ([place]) 07/09/12   Left mass  . Rosacea LEFT FACIAL RASH  . S/P radiation therapy 02/21/2012   38.75 Gy HDR 5 Fractions- vaginal cuff  . Scoliosis   . Seizures ([place])    x 1 after abrupt discontinuation of  Clonidine  . Status post carotid endarterectomy RIGHT --  1995  . Status post primary angioplasty with coronary stent 2000--  POST INFERIOR MI  . Unstable balance WALKS W/ CANE  . Vaginal cancer ([place])     Family Hx:  Family History  Problem Relation Age of Onset  . Hypertension Mother   . Hypertension Father    OB/Gyn H Menses onset at age 30 Last menstrual period in her 74s Para 0 She used oral contraceptives a proximate 10 years She denied a history of hormone replacement therapy   Review of Systems  Review of Systems  Respiratory: Positive for shortness of breath.   Musculoskeletal: Positive for back pain and flank pain.  All other systems reviewed and are negative.    Vitals:  Blood pressure (!) 159/63, pulse 63, temperature 98.3 F (36.8 C), temperature source Oral, resp. rate 17, height 5\' 6"  (1.676 m), weight 171 lb 9.6 oz (77.8 kg), SpO2 95 %.  Physical Exam: ECOG PERFORMANCE STATUS: 0 - Asymptomatic   General :  Well developed, 73 y.o., female in no apparent distress HEENT:  Normocephalic/atraumatic,  symmetric, EOMI, eyelids normal Neck:   Supple, no masses.  Lymphatics:  No cervical/ submandibular/ supraclavicular/  infraclavicular/ inguinal adenopathy Abdomen:  Soft, non-tender and nondistended. No evidence of hernia. No masses. Protuberant Extremities:  +1 edema RLE with chronic venous stasis changes Genito Urinary: Vulva: Normal external female genitalia.  Bladder/urethra: Urethral meatus normal in size and location. No lesions or   masses, well supported bladder Speculum exam: Vagina: Significantly atrophic.  No visible lesions however she has telangiectasias consistent with prior radiation.  A Pap smear was obtained.   Cervix: Surgically absent Bimanual exam:  Uterus: Surgically absent  Adnexa: No masses. Rectovaginal:  Good tone, no masses, no cul de sac nodularity, no parametrial involvement or nodularity.   Assessment/Plan: 73 year old with a clinical stage I vaginal carcinoma treated with high-dose rate vaginal brachyherapy. She completed her treatment on February 21, 2012.  1. We'll followup in results for Pap smear from today and we will notify her of the results. 2. She is now more than 5 years from her end of treatment therefore I will have her return in 1 year for an annual pelvic and Pap 3. Continue vaginal dilator and continue using estrogen cream per vagina   Cc: Harlan Stains, MD (PCP) Nancy Marus A., MD 08/14/2018, 11:56 AM

## 2018-08-14 ENCOUNTER — Other Ambulatory Visit (HOSPITAL_COMMUNITY)
Admission: RE | Admit: 2018-08-14 | Discharge: 2018-08-14 | Disposition: A | Discharge status: Home / Self Care | Payer: Medicare Other | Source: Ambulatory Visit | Attending: Gynecologic Oncology | Admitting: Gynecologic Oncology

## 2018-08-14 ENCOUNTER — Other Ambulatory Visit: Payer: Self-pay

## 2018-08-14 ENCOUNTER — Inpatient Hospital Stay: Payer: Medicare Other | Attending: Gynecologic Oncology | Admitting: Gynecologic Oncology

## 2018-08-14 VITALS — BP 159/63 | HR 63 | Temp 98.3°F | Resp 17 | Ht 66.0 in | Wt 171.6 lb

## 2018-08-14 DIAGNOSIS — I2510 Atherosclerotic heart disease of native coronary artery without angina pectoris: Secondary | ICD-10-CM | POA: Insufficient documentation

## 2018-08-14 DIAGNOSIS — C52 Malignant neoplasm of vagina: Secondary | ICD-10-CM | POA: Diagnosis present

## 2018-08-14 DIAGNOSIS — Z8544 Personal history of malignant neoplasm of other female genital organs: Secondary | ICD-10-CM | POA: Diagnosis not present

## 2018-08-14 DIAGNOSIS — Z08 Encounter for follow-up examination after completed treatment for malignant neoplasm: Secondary | ICD-10-CM | POA: Diagnosis not present

## 2018-08-14 DIAGNOSIS — F1021 Alcohol dependence, in remission: Secondary | ICD-10-CM | POA: Insufficient documentation

## 2018-08-14 DIAGNOSIS — M419 Scoliosis, unspecified: Secondary | ICD-10-CM | POA: Diagnosis not present

## 2018-08-14 DIAGNOSIS — I252 Old myocardial infarction: Secondary | ICD-10-CM | POA: Insufficient documentation

## 2018-08-14 DIAGNOSIS — Z90710 Acquired absence of both cervix and uterus: Secondary | ICD-10-CM | POA: Diagnosis not present

## 2018-08-14 DIAGNOSIS — Z905 Acquired absence of kidney: Secondary | ICD-10-CM | POA: Diagnosis not present

## 2018-08-14 DIAGNOSIS — F319 Bipolar disorder, unspecified: Secondary | ICD-10-CM | POA: Diagnosis not present

## 2018-08-14 DIAGNOSIS — I739 Peripheral vascular disease, unspecified: Secondary | ICD-10-CM | POA: Diagnosis not present

## 2018-08-14 DIAGNOSIS — M810 Age-related osteoporosis without current pathological fracture: Secondary | ICD-10-CM | POA: Diagnosis not present

## 2018-08-14 DIAGNOSIS — Z955 Presence of coronary angioplasty implant and graft: Secondary | ICD-10-CM | POA: Diagnosis not present

## 2018-08-14 DIAGNOSIS — Z85528 Personal history of other malignant neoplasm of kidney: Secondary | ICD-10-CM | POA: Diagnosis not present

## 2018-08-14 DIAGNOSIS — M1990 Unspecified osteoarthritis, unspecified site: Secondary | ICD-10-CM | POA: Insufficient documentation

## 2018-08-14 DIAGNOSIS — Z87891 Personal history of nicotine dependence: Secondary | ICD-10-CM | POA: Diagnosis not present

## 2018-08-14 DIAGNOSIS — F419 Anxiety disorder, unspecified: Secondary | ICD-10-CM | POA: Insufficient documentation

## 2018-08-14 DIAGNOSIS — I509 Heart failure, unspecified: Secondary | ICD-10-CM | POA: Insufficient documentation

## 2018-08-14 DIAGNOSIS — G8929 Other chronic pain: Secondary | ICD-10-CM | POA: Insufficient documentation

## 2018-08-14 DIAGNOSIS — Z923 Personal history of irradiation: Secondary | ICD-10-CM | POA: Diagnosis not present

## 2018-08-14 DIAGNOSIS — I110 Hypertensive heart disease with heart failure: Secondary | ICD-10-CM | POA: Insufficient documentation

## 2018-08-14 NOTE — Patient Instructions (Signed)
We will notify you of your results from today.  Please return to see [place] in 1 year.

## 2018-08-14 NOTE — Addendum Note (Signed)
Addended by: Baruch Merl on: 08/14/2018 12:19 PM   Modules accepted: Orders

## 2018-08-18 ENCOUNTER — Encounter: Payer: Self-pay | Admitting: Internal Medicine

## 2018-08-20 LAB — CYTOLOGY - PAP

## 2018-08-21 ENCOUNTER — Telehealth: Payer: Self-pay

## 2018-08-21 NOTE — Telephone Encounter (Signed)
Pt notified about pap results: negative.  No questions or concerns voiced. 

## 2018-08-22 DIAGNOSIS — I779 Disorder of arteries and arterioles, unspecified: Secondary | ICD-10-CM | POA: Diagnosis not present

## 2018-08-22 DIAGNOSIS — I10 Essential (primary) hypertension: Secondary | ICD-10-CM | POA: Diagnosis not present

## 2018-08-22 DIAGNOSIS — I2510 Atherosclerotic heart disease of native coronary artery without angina pectoris: Secondary | ICD-10-CM | POA: Diagnosis not present

## 2018-08-22 DIAGNOSIS — J449 Chronic obstructive pulmonary disease, unspecified: Secondary | ICD-10-CM | POA: Diagnosis not present

## 2018-08-22 DIAGNOSIS — K219 Gastro-esophageal reflux disease without esophagitis: Secondary | ICD-10-CM | POA: Diagnosis not present

## 2018-08-22 DIAGNOSIS — F17201 Nicotine dependence, unspecified, in remission: Secondary | ICD-10-CM | POA: Diagnosis not present

## 2018-08-22 DIAGNOSIS — I700 Atherosclerosis of aorta: Secondary | ICD-10-CM | POA: Diagnosis not present

## 2018-08-22 DIAGNOSIS — E785 Hyperlipidemia, unspecified: Secondary | ICD-10-CM | POA: Diagnosis not present

## 2018-08-22 DIAGNOSIS — Z0000 Encounter for general adult medical examination without abnormal findings: Secondary | ICD-10-CM | POA: Diagnosis not present

## 2018-08-22 DIAGNOSIS — F319 Bipolar disorder, unspecified: Secondary | ICD-10-CM | POA: Diagnosis not present

## 2018-08-22 DIAGNOSIS — I739 Peripheral vascular disease, unspecified: Secondary | ICD-10-CM | POA: Diagnosis not present

## 2018-08-22 DIAGNOSIS — M810 Age-related osteoporosis without current pathological fracture: Secondary | ICD-10-CM | POA: Diagnosis not present

## 2018-08-23 DIAGNOSIS — Z23 Encounter for immunization: Secondary | ICD-10-CM | POA: Diagnosis not present

## 2018-08-27 DIAGNOSIS — Z1211 Encounter for screening for malignant neoplasm of colon: Secondary | ICD-10-CM | POA: Diagnosis not present

## 2018-09-03 DIAGNOSIS — G518 Other disorders of facial nerve: Secondary | ICD-10-CM | POA: Diagnosis not present

## 2018-09-07 ENCOUNTER — Other Ambulatory Visit: Payer: Self-pay | Admitting: *Deleted

## 2018-09-07 DIAGNOSIS — Z87891 Personal history of nicotine dependence: Secondary | ICD-10-CM

## 2018-09-07 DIAGNOSIS — Z122 Encounter for screening for malignant neoplasm of respiratory organs: Secondary | ICD-10-CM

## 2018-09-26 DIAGNOSIS — M5030 Other cervical disc degeneration, unspecified cervical region: Secondary | ICD-10-CM | POA: Diagnosis not present

## 2018-09-26 DIAGNOSIS — Z79899 Other long term (current) drug therapy: Secondary | ICD-10-CM | POA: Diagnosis not present

## 2018-09-26 DIAGNOSIS — Z79891 Long term (current) use of opiate analgesic: Secondary | ICD-10-CM | POA: Diagnosis not present

## 2018-09-26 DIAGNOSIS — M5136 Other intervertebral disc degeneration, lumbar region: Secondary | ICD-10-CM | POA: Diagnosis not present

## 2018-09-26 DIAGNOSIS — Z5181 Encounter for therapeutic drug level monitoring: Secondary | ICD-10-CM | POA: Diagnosis not present

## 2018-10-01 ENCOUNTER — Ambulatory Visit
Admission: RE | Admit: 2018-10-01 | Discharge: 2018-10-01 | Disposition: A | Discharge status: Home / Self Care | Payer: Medicare Other | Source: Ambulatory Visit | Attending: Acute Care | Admitting: Acute Care

## 2018-10-01 ENCOUNTER — Ambulatory Visit (INDEPENDENT_AMBULATORY_CARE_PROVIDER_SITE_OTHER): Payer: Medicare Other | Admitting: Acute Care

## 2018-10-01 ENCOUNTER — Encounter: Payer: Self-pay | Admitting: Acute Care

## 2018-10-01 ENCOUNTER — Other Ambulatory Visit: Payer: Self-pay

## 2018-10-01 VITALS — BP 138/80 | HR 87 | Temp 97.7°F | Ht 66.0 in | Wt 172.4 lb

## 2018-10-01 DIAGNOSIS — Z87891 Personal history of nicotine dependence: Secondary | ICD-10-CM

## 2018-10-01 DIAGNOSIS — Z122 Encounter for screening for malignant neoplasm of respiratory organs: Secondary | ICD-10-CM

## 2018-10-01 NOTE — Patient Instructions (Signed)
Thank you for participating in the [facility]. It was our pleasure to meet you today. We will call you with the results of your scan within the next few days. Your scan will be assigned a Lung RADS category score by the physicians reading the scans.  This Lung RADS score determines follow up scanning.  See below for description of categories, and follow up screening recommendations. We will be in touch to schedule your follow up screening annually or based on recommendations of our providers. We will fax a copy of your scan results to your Primary Care Physician, or the physician who referred you to the program, to ensure they have the results. Please call the office if you have any questions or concerns regarding your scanning experience or results.  Our office number is [phone]. Please speak with Denise Phelps, RN. She is our Lung Cancer Screening RN. If she is unavailable when you call, please have the office staff send her a message. She will return your call at her earliest convenience. Remember, if your scan is normal, we will scan you annually as long as you continue to meet the criteria for the program. (Age 55-77, Current smoker or smoker who has quit within the last 15 years). If you are a smoker, remember, quitting is the single most powerful action that you can take to decrease your risk of lung cancer and other pulmonary, breathing related problems. We know quitting is hard, and we are here to help.  Please let us know if there is anything we can do to help you meet your goal of quitting. If you are a former smoker, congratulations. We are proud of you! Remain smoke free! Remember you can refer friends or family members through the number above.  We will screen them to make sure they meet criteria for the program. Thank you for helping us take better care of you by participating in Lung Screening.  Lung RADS Categories:  Lung RADS 1: no nodules  or definitely non-concerning nodules.  Recommendation is for a repeat annual scan in 12 months.  Lung RADS 2:  nodules that are non-concerning in appearance and behavior with a very low likelihood of becoming an active cancer. Recommendation is for a repeat annual scan in 12 months.  Lung RADS 3: nodules that are probably non-concerning , includes nodules with a low likelihood of becoming an active cancer.  Recommendation is for a 6-month repeat screening scan. Often noted after an upper respiratory illness. We will be in touch to make sure you have no questions, and to schedule your 6-month scan.  Lung RADS 4 A: nodules with concerning findings, recommendation is most often for a follow up scan in 3 months or additional testing based on our provider's assessment of the scan. We will be in touch to make sure you have no questions and to schedule the recommended 3 month follow up scan.  Lung RADS 4 B:  indicates findings that are concerning. We will be in touch with you to schedule additional diagnostic testing based on our provider's  assessment of the scan.   

## 2018-10-01 NOTE — Progress Notes (Signed)
Shared Decision Making Visit Lung Cancer Screening Program [phone])   Eligibility:  Age 73 y.o.  Pack Years Smoking History Calculation 117 pack year smoking history (# packs/per year x # years smoked)  Recent History of coughing up blood  no  Unexplained weight loss? no ( >Than 15 pounds within the last 6 months )  Prior History Lung / other cancer no (Diagnosis within the last 5 years already requiring surveillance chest CT Scans).  Smoking Status Former Smoker  Former Smokers: Years since quit: 7 years  Quit Date: 01/18/2011  Visit Components:  Discussion included one or more decision making aids. yes  Discussion included risk/benefits of screening. yes  Discussion included potential follow up diagnostic testing for abnormal scans. yes  Discussion included meaning and risk of over diagnosis. yes  Discussion included meaning and risk of False Positives. yes  Discussion included meaning of total radiation exposure. yes  Counseling Included:  Importance of adherence to annual lung cancer LDCT screening. yes  Impact of comorbidities on ability to participate in the program. yes  Ability and willingness to under diagnostic treatment. yes  Smoking Cessation Counseling:  Current Smokers:   Discussed importance of smoking cessation. NA, former smoker  Information about tobacco cessation classes and interventions provided to patient. yes  Patient provided with "ticket" for LDCT Scan. yes  Symptomatic Patient. no  Counseling  Diagnosis Code: Tobacco Use Z72.0  Asymptomatic Patient yes  Counseling (Intermediate counseling: > three minutes counseling) [id number]  Former Smokers:   Discussed the importance of maintaining cigarette abstinence. yes  Diagnosis Code: Personal History of Nicotine Dependence. [id number]  Information about tobacco cessation classes and interventions provided to patient. Yes  Patient provided with "ticket" for LDCT Scan. yes  Written  Order for Lung Cancer Screening with LDCT placed in Epic. Yes (CT Chest Lung Cancer Screening Low Dose W/O CM) [id number] Z12.2-Screening of respiratory organs Z87.891-Personal history of nicotine dependence  I spent 25 minutes of face to face time with Ms. Shadden discussing the risks and benefits of lung cancer screening. We viewed a power point together that explained in detail the above noted topics. We took the time to pause the power point at intervals to allow for questions to be asked and answered to ensure understanding. We discussed that she had taken the single most powerful action possible to decrease her risk of developing lung cancer when she quit smoking. I counseled her to remain smoke free, and to contact me if she ever had the desire to smoke again so that I can provide resources and tools to help support the effort to remain smoke free. We discussed the time and location of the scan, and that either  Doroteo Glassman RN or I will call with the results within  24-48 hours of receiving them. She has my card and contact information in the event she needs to speak with me, in addition to a copy of the power point we reviewed as a resource. She verbalized understanding of all of the above and had no further questions upon leaving the office.     I explained to the patient that there has been a high incidence of coronary artery disease noted on these exams. I explained that this is a non-gated exam therefore degree or severity cannot be determined. This patient is not on statin therapy. I have asked the patient to follow-up with their PCP regarding any incidental finding of coronary artery disease and management with diet or medication as they feel  is clinically indicated. The patient verbalized understanding of the above and had no further questions.    Magdalen Spatz, NP 10/01/2018 4:38 PM

## 2018-10-03 ENCOUNTER — Telehealth: Payer: Self-pay | Admitting: Acute Care

## 2018-10-03 ENCOUNTER — Telehealth: Payer: Self-pay

## 2018-10-03 NOTE — Telephone Encounter (Signed)
Noted, thanks!

## 2018-10-03 NOTE — Telephone Encounter (Signed)
Spoke to patient who states she wishes to decline her colonoscopy at this time. She states her PCP Dr. Dema Severin did a stool study for her that came back fine. Patient is not sure if it was a cologuard, but wishes to continue doing that instead.

## 2018-10-04 ENCOUNTER — Telehealth: Payer: Self-pay | Admitting: Acute Care

## 2018-10-04 DIAGNOSIS — Z122 Encounter for screening for malignant neoplasm of respiratory organs: Secondary | ICD-10-CM

## 2018-10-04 DIAGNOSIS — Z87891 Personal history of nicotine dependence: Secondary | ICD-10-CM

## 2018-10-04 NOTE — Telephone Encounter (Signed)
lmtcb

## 2018-10-04 NOTE — Telephone Encounter (Signed)
I told her we would call Wednesday or Friday. We will get her called. Thanks so much.

## 2018-10-04 NOTE — Telephone Encounter (Signed)
Call returned to patient, she reports she had a CT scan on Monday and she was told by SG that someone would be calling her Wednesday with the results. I made her aware I would get this message to [place] as well as her nurse. I made her aware typically her nurse is here in Fridays but I would send a message to them both. Voiced understanding.

## 2018-10-05 NOTE — Telephone Encounter (Signed)
See telephone note 10/04/18.  Will close this message.

## 2018-10-05 NOTE — Telephone Encounter (Signed)
Pt informed of CT results per Sarah Groce, NP.  PT verbalized understanding.  Copy sent to PCP.  Order placed for 1 yr f/u CT.  

## 2018-10-09 ENCOUNTER — Other Ambulatory Visit: Payer: Self-pay | Admitting: Family Medicine

## 2018-10-09 DIAGNOSIS — Z1231 Encounter for screening mammogram for malignant neoplasm of breast: Secondary | ICD-10-CM

## 2018-11-06 DIAGNOSIS — Z23 Encounter for immunization: Secondary | ICD-10-CM | POA: Diagnosis not present

## 2018-11-07 DIAGNOSIS — J410 Simple chronic bronchitis: Secondary | ICD-10-CM | POA: Diagnosis not present

## 2018-11-22 ENCOUNTER — Ambulatory Visit: Payer: Medicare Other

## 2018-11-22 DIAGNOSIS — I10 Essential (primary) hypertension: Secondary | ICD-10-CM | POA: Diagnosis not present

## 2018-11-22 DIAGNOSIS — J449 Chronic obstructive pulmonary disease, unspecified: Secondary | ICD-10-CM | POA: Diagnosis not present

## 2018-11-22 DIAGNOSIS — E785 Hyperlipidemia, unspecified: Secondary | ICD-10-CM | POA: Diagnosis not present

## 2018-11-22 DIAGNOSIS — F319 Bipolar disorder, unspecified: Secondary | ICD-10-CM | POA: Diagnosis not present

## 2018-11-23 ENCOUNTER — Ambulatory Visit
Admission: RE | Admit: 2018-11-23 | Discharge: 2018-11-23 | Disposition: A | Discharge status: Home / Self Care | Payer: Medicare Other | Source: Ambulatory Visit | Attending: Family Medicine | Admitting: Family Medicine

## 2018-11-23 ENCOUNTER — Other Ambulatory Visit: Payer: Self-pay

## 2018-11-23 DIAGNOSIS — Z1231 Encounter for screening mammogram for malignant neoplasm of breast: Secondary | ICD-10-CM

## 2018-12-04 DIAGNOSIS — G518 Other disorders of facial nerve: Secondary | ICD-10-CM | POA: Diagnosis not present

## 2018-12-22 DIAGNOSIS — M5030 Other cervical disc degeneration, unspecified cervical region: Secondary | ICD-10-CM | POA: Diagnosis not present

## 2019-02-11 DIAGNOSIS — H04123 Dry eye syndrome of bilateral lacrimal glands: Secondary | ICD-10-CM | POA: Diagnosis not present

## 2019-02-11 DIAGNOSIS — G245 Blepharospasm: Secondary | ICD-10-CM | POA: Diagnosis not present

## 2019-02-11 DIAGNOSIS — H0100A Unspecified blepharitis right eye, upper and lower eyelids: Secondary | ICD-10-CM | POA: Diagnosis not present

## 2019-02-11 DIAGNOSIS — H16212 Exposure keratoconjunctivitis, left eye: Secondary | ICD-10-CM | POA: Diagnosis not present

## 2019-02-22 DIAGNOSIS — M7989 Other specified soft tissue disorders: Secondary | ICD-10-CM | POA: Diagnosis not present

## 2019-02-22 DIAGNOSIS — E785 Hyperlipidemia, unspecified: Secondary | ICD-10-CM | POA: Diagnosis not present

## 2019-02-22 DIAGNOSIS — Z7189 Other specified counseling: Secondary | ICD-10-CM | POA: Diagnosis not present

## 2019-02-22 DIAGNOSIS — J449 Chronic obstructive pulmonary disease, unspecified: Secondary | ICD-10-CM | POA: Diagnosis not present

## 2019-02-22 DIAGNOSIS — I779 Disorder of arteries and arterioles, unspecified: Secondary | ICD-10-CM | POA: Diagnosis not present

## 2019-02-22 DIAGNOSIS — I10 Essential (primary) hypertension: Secondary | ICD-10-CM | POA: Diagnosis not present

## 2019-02-22 DIAGNOSIS — F319 Bipolar disorder, unspecified: Secondary | ICD-10-CM | POA: Diagnosis not present

## 2019-02-22 DIAGNOSIS — R7303 Prediabetes: Secondary | ICD-10-CM | POA: Diagnosis not present

## 2019-02-25 ENCOUNTER — Other Ambulatory Visit: Payer: Self-pay

## 2019-02-25 ENCOUNTER — Ambulatory Visit
Admission: RE | Admit: 2019-02-25 | Discharge: 2019-02-25 | Disposition: A | Discharge status: Home / Self Care | Payer: Medicare Other | Source: Ambulatory Visit | Attending: Family Medicine | Admitting: Family Medicine

## 2019-02-25 ENCOUNTER — Other Ambulatory Visit: Payer: Self-pay | Admitting: Family Medicine

## 2019-02-25 DIAGNOSIS — J449 Chronic obstructive pulmonary disease, unspecified: Secondary | ICD-10-CM

## 2019-02-25 DIAGNOSIS — R609 Edema, unspecified: Secondary | ICD-10-CM

## 2019-02-25 DIAGNOSIS — M19072 Primary osteoarthritis, left ankle and foot: Secondary | ICD-10-CM | POA: Diagnosis not present

## 2019-02-25 DIAGNOSIS — M7989 Other specified soft tissue disorders: Secondary | ICD-10-CM | POA: Diagnosis not present

## 2019-02-27 NOTE — Progress Notes (Signed)
Cardiology Office Note   Date:  02/28/2019   ID:  [name], DOB 05-23-1945, MRN [id number]  PCP:  Harlan Stains, MD  Cardiologist:  Peter Martinique, MD EP: None  Chief Complaint  Patient presents with  . Chest Pain  . Hypertension      History of Present Illness: [name] is a 74 y.o. female with PMH of CAD s/p inferior MI with PCI to RCA in 2000, chronic combined CHF (EF 45-50% 02/2017), carotid artery stenosis s/p R CEA in 1995, PAD with moderate bilateral fem-pop disease medically, HTN, HLD, COPD, and renal CA s/p partial L nephrectomy in 2014, who presents for evaluation of SOB and chest pain.  She was last evaluated by cardiology via a telemedicine visit with Dr. Martinique 07/02/2018, at which time she had no cardiac complaints. She was recommended for annual surveillance carotid and LE arterial dopplers for close monitoring of her PAD and carotid artery disease, as well as follow-up in 6 months. Her last ischemic evaluation was a NST 02/2017 which showed EF 51%, fixed defect in the inferolateral wall which was felt to be reflective of prior inferior MI. Her last echocardiogram 02/2017 showed EF 45-50%, hypokinesis and scarring of basal inferolateral and inferior myocardium c/w infarction in the RCA, and G1DD.   She presents today for routine follow-up. She has been feeling generally poor for the past couple weeks. She reports a burning chest discomfort which radiates across her chest from the right side. She has not found any aggravating/alleviating factors. Also with some SOB and LE edema. No complaints of orthopnea or PND. Symptoms are not similar to prior angina; she feels more likely she has extra fluid on board. SBP's have been in the 180s-160s at home with recent increase in her clonidine to 0.2mg  BID by her PCP.     Past Medical History:  Diagnosis Date  . Alcoholism ([place])    recovering since 2000  . Anxiety and depression   . Arthritis BACK  . Bipolar disorder  ([place])   . Blepharospasm LEFT EYE  . CHF exacerbation ([place]) 02/24/2017  . Chronic back pain   . Coronary artery disease CARDIOLOGIST- DR Martinique--- LAST VISIT NOTE 09-07-2009  W/ CHART  . Emphysema   . History of alcohol abuse RECOVERING SINCE 2000  . History of Left renal mass 03/02/2012   underwent partial nephrectomy  . HTN (hypertension)   . Idiopathic acute facial nerve palsy LEFT SIDE--  BOTOX THERAPY  . Inferior MI ([place]) 2000--  POST PTCA W/ STENT X1  . Osteoporosis   . Other and unspecified general anesthetics causing adverse effect in therapeutic use post op delirium--  last anes record w/ chart  from   09-22-2009 (spinal w/ light sedation)  . Peripheral vascular disease ([place]) POST RIGHT CAROTID SURG.  1995  . Renal cell carcinoma ([place]) 07/09/12   Left mass  . Rosacea LEFT FACIAL RASH  . S/P radiation therapy 02/21/2012   38.75 Gy HDR 5 Fractions- vaginal cuff  . Scoliosis   . Seizures ([place])    x 1 after abrupt discontinuation of  Clonidine  . Status post carotid endarterectomy RIGHT --  1995  . Status post primary angioplasty with coronary stent 2000--  POST INFERIOR MI  . Unstable balance WALKS W/ CANE  . Vaginal cancer [facility])     Past Surgical History:  Procedure Laterality Date  . BREAST EXCISIONAL BIOPSY Left 2019   b9 axilla Bx X 3  . CAROTID  ENDARTERECTOMY  1995   RIGHT  . CATARACT EXTRACTION W/ INTRAOCULAR LENS  IMPLANT, BILATERAL    . CERVICAL CONIZATION W/BX  09-23-2008  . CORONARY ANGIOPLASTY WITH STENT PLACEMENT  2000-   INFERIOR MI   X1 STENT TO RCA  . EUS N/A 03/05/2012   Procedure: FULL UPPER ENDOSCOPIC ULTRASOUND (EUS) RADIAL and EGD;  Surgeon: Milus Banister, MD;  Location: [facility] ENDOSCOPY;  Service: Endoscopy;  Laterality: N/A;  ercp scope first than eus scope  . HEMIARTHROPLASTY HIP  12-26-2008   LEFT FEMORAL NECK FX  . ORIF HIP FRACTURE  02-13-2007   RIGHT FEMORAL NECK FX  . ORIF RIGHT DISTAL RADIUS AND RIGHT PROXIMAL HUMEROUS NECK FX'S  10-10-2005  .  RIGHT SHOULDER SURG.  2007  . ROBOTIC ASSITED PARTIAL NEPHRECTOMY Left 07/09/2012   Procedure: ROBOTIC ASSITED PARTIAL NEPHRECTOMY;  Surgeon: Dutch Gray, MD;  Location: [facility] ORS;  Service: Urology;  Laterality: Left;  . TOTAL HIP ARTHROPLASTY  04-15-2008   POST FAILED  RIGHT HIP ORIF FEMORAL FX  . TOTAL KNEE ARTHROPLASTY  09-22-2009   RIGHT  . UPPER RIGHT VAGINAL REGION  12/28/11   BIOPSY: SQUAMOUS CELL CARCINOMA  . VAGINAL HYSTERECTOMY  07/06/2009   Secondary to dysplasia     Current Outpatient Medications  Medication Sig Dispense Refill  . albuterol (PROVENTIL HFA;VENTOLIN HFA) 108 (90 BASE) MCG/ACT inhaler Inhale 2 puffs into the lungs every 6 (six) hours as needed for wheezing.    Marland Kitchen amLODipine-atorvastatin (CADUET) 5-20 MG tablet Take 1 tablet by mouth daily.     . ARIPiprazole (ABILIFY) 20 MG tablet Take 10 mg by mouth every morning.     Marland Kitchen aspirin EC 81 MG tablet Take 81 mg by mouth every morning.     . Calcium Carbonate-Vitamin D 600-400 MG-UNIT per tablet Take 1 tablet by mouth 2 (two) times daily.     . Cholecalciferol (VITAMIN D3) 2000 UNITS TABS Take 2,000 Units by mouth 2 (two) times daily.     . cloNIDine (CATAPRES) 0.2 MG tablet Take 0.2 mg by mouth 2 (two) times daily.     Marland Kitchen dexlansoprazole (DEXILANT) 60 MG capsule Take 60 mg by mouth daily.    Marland Kitchen docusate sodium (COLACE) 100 MG capsule Take 100 mg by mouth as directed.    . doxycycline (VIBRA-TABS) 100 MG tablet doxycycline hyclate 100 mg tablet  TAKE 1 TABLET BY MOUTH EVERY 12 HOURS FOR 10 DAYS    . furosemide (LASIX) 20 MG tablet Take 20 mg by mouth every morning.    . lamoTRIgine (LAMICTAL) 100 MG tablet Take 200 mg by mouth 2 (two) times daily.     . mometasone-formoterol (DULERA) 100-5 MCG/ACT AERO Inhale 2 puffs into the lungs 2 (two) times daily. 1 Inhaler 3  . Multiple Vitamin (MULTIVITAMIN) capsule Take 1 capsule by mouth every morning.     . nitroGLYCERIN (NITROSTAT) 0.4 MG SL tablet Place 1 tablet (0.4 mg total)  under the tongue every 5 (five) minutes as needed for chest pain. 25 tablet 3  . oxyCODONE-acetaminophen (PERCOCET) 10-325 MG per tablet Take 1 tablet by mouth every 4 (four) hours.     . polyethylene glycol (MIRALAX / GLYCOLAX) packet Take 17 g by mouth daily as needed for mild constipation or moderate constipation.     . potassium chloride (KLOR-CON) 8 MEQ tablet Take 8 mEq by mouth every morning.     . sertraline (ZOLOFT) 50 MG tablet Take 50 mg by mouth every morning.     Marland Kitchen  tiotropium (SPIRIVA) 18 MCG inhalation capsule Place 18 mcg into inhaler and inhale every evening.     Marland Kitchen tiZANidine (ZANAFLEX) 4 MG tablet tizanidine 4 mg tablet  TAKE 1 TABLET BY MOUTH 4TIMES A DAY AS NEEDED FOR SPASMS    . valsartan (DIOVAN) 320 MG tablet Take 1 tablet (320 mg total) by mouth daily. 30 tablet 6   No current facility-administered medications for this visit.    Allergies:   Patient has no known allergies.    Social History:  The patient  reports that she quit smoking about 8 years ago. Her smoking use included cigarettes. She has a 100.00 pack-year smoking history. She has never used smokeless tobacco. She reports previous alcohol use. She reports that she does not use drugs.   Family History:  The patient's family history includes Hypertension in her father and mother.    ROS:  Please see the history of present illness.   Otherwise, review of systems are positive for none.   All other systems are reviewed and negative.    PHYSICAL EXAM: VS:  BP (!) 188/96   Pulse 61   Temp 98.2 F (36.8 C)   Ht 5\' 6"  (1.676 m)   Wt 170 lb 3.2 oz (77.2 kg)   BMI 27.47 kg/m  , BMI Body mass index is 27.47 kg/m. GEN: Well nourished, well developed, in no acute distress HEENT: normal Neck: no JVD, carotid bruits, or masses Cardiac: RRR; no murmurs, rubs, or gallops, 1+edema  Respiratory:  clear to auscultation bilaterally, normal work of breathing GI: soft, nontender, nondistended, + BS MS: no deformity or  atrophy Skin: warm and dry, no rash Neuro:  Strength and sensation are intact Psych: euthymic mood, full affect   EKG:  EKG is ordered today. The ekg ordered today demonstrates NSR with sinus arrhythmia, rate 61 bpm, non-specific T wave flattening in inferolateral leads, no STE/D   Recent Labs: No results found for requested labs within last 8760 hours.    Lipid Panel    Component Value Date/Time   CHOL  12/25/2008 0550    161        ATP III CLASSIFICATION:  <200     mg/dL   Desirable  200-239  mg/dL   Borderline High  >=240    mg/dL   High          TRIG 39 12/25/2008 0550   HDL 102 12/25/2008 0550   CHOLHDL 1.6 12/25/2008 0550   VLDL 8 12/25/2008 0550   LDLCALC  12/25/2008 0550    51        Total Cholesterol/HDL:CHD Risk Coronary Heart Disease Risk Table                     Men   Women  1/2 Average Risk   3.4   3.3  Average Risk       5.0   4.4  2 X Average Risk   9.6   7.1  3 X Average Risk  23.4   11.0        Use the calculated Patient Ratio above and the CHD Risk Table to determine the patient's CHD Risk.        ATP III CLASSIFICATION (LDL):  <100     mg/dL   Optimal  100-129  mg/dL   Near or Above                    Optimal  130-159  mg/dL  Borderline  160-189  mg/dL   High  >190     mg/dL   Very High      Wt Readings from Last 3 Encounters:  02/28/19 170 lb 3.2 oz (77.2 kg)  10/01/18 172 lb 6.4 oz (78.2 kg)  08/14/18 171 lb 9.6 oz (77.8 kg)      Other studies Reviewed: Additional studies/ records that were reviewed today include:   NST 02/2017: IMPRESSION: 1. Fixed defect in the inferolateral wall, likely old infarct. There appears to be some mild peri-infarct ischemia in the adjacent lateral wall.  2. Decreased wall motion and thickening in the inferolateral wall.  3. Left ventricular ejection fraction 51%  4. Non invasive risk stratification*: High   Echocardiogram 02/2017: - Left ventricle: The cavity size was normal. There was  mild  concentric hypertrophy. Systolic function was mildly reduced. The  estimated ejection fraction was in the range of 45% to 50%.  Hypokinesis and scarring of the basal-midinferolateral and  inferior myocardium; consistent with infarction in the  distribution of the right coronary or left circumflex coronary  artery. Doppler parameters are consistent with abnormal left  ventricular relaxation (grade 1 diastolic dysfunction).  - Mitral valve: Calcified annulus.    ASSESSMENT AND PLAN:  1. Acute on chronic combined CHF: EF 45-50% in 2019. She has been having SOB and LE edema. Not on BBlocker due to baseline bradycardia - Will Increase lasix to 40mg  daily x5 days, then resume lasix 20mg  daily. Will have her take potassium on days she takes extra lasix - Will check BMET at follow-up visit for close monitoring of her kidneys  2. HTN: BP has been significantly elevated at home. Discussed poor BP control with pharmacy. Will plan to start weaning off clonidine.  - Decrease clonidine by 1/2 - 0.1mg  BID. Will taper further at her follow-up visit next week.  - Will stop losartan and start valsartan 320mg  daily.  3. CAD s/p MI with PCI to RCA in 2000: she has some burning chest discomfort recently but states this is not c/w prior angina; perhaps related to #1. Not on BBlocker due to baseline bradycardia - Continue aspirin and statin  4. Carotid artery stenosis: s/p R CEA 1995.  - Ordered for surveillance dopplers 06/2019 - Continue aspirin and statin  5. PAD: moderate bilateral fem-pop disease noted on last ABI 02/2018.  - Will arrange surveillance ABI's at follow-up visit. - Continue aspirin and statin  6. HLD: - Continue pitavastatin   Current medicines are reviewed at length with the patient today.  The patient does not have concerns regarding medicines.  The following changes have been made:  As above  Labs/ tests ordered today include:   Orders Placed This Encounter    Procedures  . EKG 12-Lead     Disposition:   FU with me in 1 week  Signed, Abigail Butts, PA-C  02/28/2019 6:36 PM

## 2019-02-28 ENCOUNTER — Telehealth: Payer: Self-pay | Admitting: Cardiology

## 2019-02-28 ENCOUNTER — Other Ambulatory Visit: Payer: Self-pay

## 2019-02-28 ENCOUNTER — Ambulatory Visit (INDEPENDENT_AMBULATORY_CARE_PROVIDER_SITE_OTHER): Payer: Medicare Other | Admitting: Medical

## 2019-02-28 ENCOUNTER — Encounter: Payer: Self-pay | Admitting: Medical

## 2019-02-28 VITALS — BP 188/96 | HR 61 | Temp 98.2°F | Ht 66.0 in | Wt 170.2 lb

## 2019-02-28 DIAGNOSIS — I6523 Occlusion and stenosis of bilateral carotid arteries: Secondary | ICD-10-CM | POA: Diagnosis not present

## 2019-02-28 DIAGNOSIS — I25118 Atherosclerotic heart disease of native coronary artery with other forms of angina pectoris: Secondary | ICD-10-CM | POA: Diagnosis not present

## 2019-02-28 DIAGNOSIS — R079 Chest pain, unspecified: Secondary | ICD-10-CM

## 2019-02-28 DIAGNOSIS — E785 Hyperlipidemia, unspecified: Secondary | ICD-10-CM | POA: Diagnosis not present

## 2019-02-28 DIAGNOSIS — I739 Peripheral vascular disease, unspecified: Secondary | ICD-10-CM

## 2019-02-28 DIAGNOSIS — I10 Essential (primary) hypertension: Secondary | ICD-10-CM

## 2019-02-28 MED ORDER — VALSARTAN 320 MG PO TABS: 320.0000 mg | ORAL_TABLET | Freq: Every day | ORAL | 6 refills | Status: DC

## 2019-02-28 MED ORDER — NITROGLYCERIN 0.4 MG SL SUBL: 0.4000 mg | SUBLINGUAL_TABLET | SUBLINGUAL | 3 refills | Status: DC | PRN

## 2019-02-28 NOTE — Patient Instructions (Addendum)
Medication Instructions:   CUT Clonidine in half. Call our office to inform of current dose so the new dosage amount can be given to patient and input into chart  STOP LOSARTAN  START Valsartan 320 mg daily  INCREASE Lasix to 40 mg daily for 5 days. On day 6 resume 20 mg daily  TAKE 2 tablets of Potassium for 5 days. On day 6 resume takeing 1 tablet  Try TUMS for the burning in your chest  *If you need a refill on your cardiac medications before your next appointment, please call your pharmacy*  Lab Work: NONE ordered at this time of appointment   If you have labs (blood work) drawn today and your tests are completely normal, you will receive your results only by: Marland Kitchen MyChart Message (if you have MyChart) OR . A paper copy in the mail If you have any lab test that is abnormal or we need to change your treatment, we will call you to review the results.  Testing/Procedures: NONE ordered at this time of appointment   Follow-Up: At [facility], you and your health needs are our priority.  As part of our continuing mission to provide you with exceptional heart care, we have created designated Provider Care Teams.  These Care Teams include your primary Cardiologist (physician) and Advanced Practice Providers (APPs -  Physician Assistants and Nurse Practitioners) who all work together to provide you with the care you need, when you need it.  Your next appointment:   6 day(s)  The format for your next appointment:   In Person  Provider:   Roby Lofts, PA-C  Other Instructions  Take your blood pressure daily and record on a log.  Bring to your next visit.  Weigh yourself at the same time daily and record on a log. Bring to your next visit.    Low-Sodium Eating Plan Sodium, which is an element that makes up salt, helps you maintain a healthy balance of fluids in your body. Too much sodium can increase your blood pressure and cause fluid and waste to be held in your  body. Your health care provider or dietitian may recommend following this plan if you have high blood pressure (hypertension), kidney disease, liver disease, or heart failure. Eating less sodium can help lower your blood pressure, reduce swelling, and protect your heart, liver, and kidneys. What are tips for following this plan? General guidelines  Most people on this plan should limit their sodium intake to 1,500-2,000 mg (milligrams) of sodium each day. Reading food labels   The Nutrition Facts label lists the amount of sodium in one serving of the food. If you eat more than one serving, you must multiply the listed amount of sodium by the number of servings.  Choose foods with less than 140 mg of sodium per serving.  Avoid foods with 300 mg of sodium or more per serving. Shopping  Look for lower-sodium products, often labeled as "low-sodium" or "no salt added."  Always check the sodium content even if foods are labeled as "unsalted" or "no salt added".  Buy fresh foods. ? Avoid canned foods and premade or frozen meals. ? Avoid canned, cured, or processed meats  Buy breads that have less than 80 mg of sodium per slice. Cooking  Eat more home-cooked food and less restaurant, buffet, and fast food.  Avoid adding salt when cooking. Use salt-free seasonings or herbs instead of table salt or sea salt. Check with your health care provider or pharmacist  before using salt substitutes.  Cook with plant-based oils, such as canola, sunflower, or olive oil. Meal planning  When eating at a restaurant, ask that your food be prepared with less salt or no salt, if possible.  Avoid foods that contain MSG (monosodium glutamate). MSG is sometimes added to [place] food, bouillon, and some canned foods. What foods are recommended? The items listed may not be a complete list. Talk with your dietitian about what dietary choices are best for you. Grains Low-sodium cereals, including oats, puffed  wheat and rice, and shredded wheat. Low-sodium crackers. Unsalted rice. Unsalted pasta. Low-sodium bread. Whole-grain breads and whole-grain pasta. Vegetables Fresh or frozen vegetables. "No salt added" canned vegetables. "No salt added" tomato sauce and paste. Low-sodium or reduced-sodium tomato and vegetable juice. Fruits Fresh, frozen, or canned fruit. Fruit juice. Meats and other protein foods Fresh or frozen (no salt added) meat, poultry, seafood, and fish. Low-sodium canned tuna and salmon. Unsalted nuts. Dried peas, beans, and lentils without added salt. Unsalted canned beans. Eggs. Unsalted nut butters. Dairy Milk. Soy milk. Cheese that is naturally low in sodium, such as ricotta cheese, fresh mozzarella, or Swiss cheese Low-sodium or reduced-sodium cheese. Cream cheese. Yogurt. Fats and oils Unsalted butter. Unsalted margarine with no trans fat. Vegetable oils such as canola or olive oils. Seasonings and other foods Fresh and dried herbs and spices. Salt-free seasonings. Low-sodium mustard and ketchup. Sodium-free salad dressing. Sodium-free light mayonnaise. Fresh or refrigerated horseradish. Lemon juice. Vinegar. Homemade, reduced-sodium, or low-sodium soups. Unsalted popcorn and pretzels. Low-salt or salt-free chips. What foods are not recommended? The items listed may not be a complete list. Talk with your dietitian about what dietary choices are best for you. Grains Instant hot cereals. Bread stuffing, pancake, and biscuit mixes. Croutons. Seasoned rice or pasta mixes. Noodle soup cups. Boxed or frozen macaroni and cheese. Regular salted crackers. Self-rising flour. Vegetables Sauerkraut, pickled vegetables, and relishes. Olives. [place] fries. Onion rings. Regular canned vegetables (not low-sodium or reduced-sodium). Regular canned tomato sauce and paste (not low-sodium or reduced-sodium). Regular tomato and vegetable juice (not low-sodium or reduced-sodium). Frozen vegetables in  sauces. Meats and other protein foods Meat or fish that is salted, canned, smoked, spiced, or pickled. Bacon, ham, sausage, hotdogs, corned beef, chipped beef, packaged lunch meats, salt pork, jerky, pickled herring, anchovies, regular canned tuna, sardines, salted nuts. Dairy Processed cheese and cheese spreads. Cheese curds. Blue cheese. Feta cheese. String cheese. Regular cottage cheese. Buttermilk. Canned milk. Fats and oils Salted butter. Regular margarine. Ghee. Bacon fat. Seasonings and other foods Onion salt, garlic salt, seasoned salt, table salt, and sea salt. Canned and packaged gravies. Worcestershire sauce. Tartar sauce. Barbecue sauce. Teriyaki sauce. Soy sauce, including reduced-sodium. Steak sauce. Fish sauce. Oyster sauce. Cocktail sauce. Horseradish that you find on the shelf. Regular ketchup and mustard. Meat flavorings and tenderizers. Bouillon cubes. Hot sauce and Tabasco sauce. Premade or packaged marinades. Premade or packaged taco seasonings. Relishes. Regular salad dressings. Salsa. Potato and tortilla chips. Corn chips and puffs. Salted popcorn and pretzels. Canned or dried soups. Pizza. Frozen entrees and pot pies. Summary  Eating less sodium can help lower your blood pressure, reduce swelling, and protect your heart, liver, and kidneys.  Most people on this plan should limit their sodium intake to 1,500-2,000 mg (milligrams) of sodium each day.  Canned, boxed, and frozen foods are high in sodium. Restaurant foods, fast foods, and pizza are also very high in sodium. You also get sodium by adding salt to  food.  Try to cook at home, eat more fresh fruits and vegetables, and eat less fast food, canned, processed, or prepared foods. This information is not intended to replace advice given to you by your health care provider. Make sure you discuss any questions you have with your health care provider. Document Revised: 12/16/2016 Document Reviewed: 12/28/2015 Elsevier  Patient Education  2020 [employer].

## 2019-02-28 NOTE — Telephone Encounter (Signed)
Patient states she is calling to make [name] aware that cloNIDine (CATAPRES) 0.2 MG tablet is the medication she has been taking.Please call to confirm.

## 2019-03-01 DIAGNOSIS — Z23 Encounter for immunization: Secondary | ICD-10-CM | POA: Diagnosis not present

## 2019-03-01 MED ORDER — CLONIDINE HCL 0.1 MG PO TABS: 0.1000 mg | ORAL_TABLET | Freq: Two times a day (BID) | ORAL | 0 refills | Status: DC

## 2019-03-05 DIAGNOSIS — G518 Other disorders of facial nerve: Secondary | ICD-10-CM | POA: Diagnosis not present

## 2019-03-06 ENCOUNTER — Encounter: Payer: Self-pay | Admitting: Medical

## 2019-03-06 ENCOUNTER — Ambulatory Visit (INDEPENDENT_AMBULATORY_CARE_PROVIDER_SITE_OTHER): Payer: Medicare Other | Admitting: Medical

## 2019-03-06 ENCOUNTER — Other Ambulatory Visit: Payer: Self-pay

## 2019-03-06 VITALS — BP 120/60 | HR 60 | Temp 97.1°F | Ht 66.0 in | Wt 167.2 lb

## 2019-03-06 DIAGNOSIS — E785 Hyperlipidemia, unspecified: Secondary | ICD-10-CM

## 2019-03-06 DIAGNOSIS — I25118 Atherosclerotic heart disease of native coronary artery with other forms of angina pectoris: Secondary | ICD-10-CM | POA: Diagnosis not present

## 2019-03-06 DIAGNOSIS — R079 Chest pain, unspecified: Secondary | ICD-10-CM | POA: Diagnosis not present

## 2019-03-06 DIAGNOSIS — I739 Peripheral vascular disease, unspecified: Secondary | ICD-10-CM

## 2019-03-06 DIAGNOSIS — I6523 Occlusion and stenosis of bilateral carotid arteries: Secondary | ICD-10-CM

## 2019-03-06 DIAGNOSIS — I10 Essential (primary) hypertension: Secondary | ICD-10-CM

## 2019-03-06 DIAGNOSIS — I5032 Chronic diastolic (congestive) heart failure: Secondary | ICD-10-CM | POA: Diagnosis not present

## 2019-03-06 DIAGNOSIS — I5042 Chronic combined systolic (congestive) and diastolic (congestive) heart failure: Secondary | ICD-10-CM

## 2019-03-06 DIAGNOSIS — I2510 Atherosclerotic heart disease of native coronary artery without angina pectoris: Secondary | ICD-10-CM | POA: Diagnosis not present

## 2019-03-06 NOTE — Patient Instructions (Addendum)
Medication Instructions:  DECREASE YOUR CLONIDINE TO 0.1MG  TWICE A DAY *If you need a refill on your cardiac medications before your next appointment, please call your pharmacy*  Lab Work: TODAY: BMET BNP If you have labs (blood work) drawn today and your tests are completely normal, you will receive your results only by: Marland Kitchen MyChart Message (if you have MyChart) OR . A paper copy in the mail If you have any lab test that is abnormal or we need to change your treatment, we will call you to review the results.   Follow-Up: At [facility], you and your health needs are our priority.  As part of our continuing mission to provide you with exceptional heart care, we have created designated Provider Care Teams.  These Care Teams include your primary Cardiologist (physician) and Advanced Practice Providers (APPs -  Physician Assistants and Nurse Practitioners) who all work together to provide you with the care you need, when you need it.  Your next appointment:   3 month(s)  The format for your next appointment:   In Person  Provider:   Peter Martinique, MD  Other Instructions PLEASE SCHEDULE AN APPT. WITH OUR HYP[facility]ION CL[facility]( [facility])  PLEASE FOLLOW UP WITH YOUR PCP  LOG YOUR BLOOD PRESSURES

## 2019-03-06 NOTE — Progress Notes (Signed)
Cardiology Office Note   Date:  03/07/2019   ID:  [name], DOB Jun 24, 1945, MRN [id number]  PCP:  Harlan Stains, MD  Cardiologist:  Peter Martinique, MD EP: None  Chief Complaint  Patient presents with  . Follow-up    SOB, CP, HTN      History of Present Illness: [name] is a 74 y.o. female with PMH of CAD s/p inferior MI with PCI to RCA in 2000, chronic combined CHF (EF 45-50% 02/2017), carotid artery stenosis s/p R CEA in 1995, PAD with moderate bilateral fem-pop disease medically, HTN, HLD, COPD, and renal CA s/p partial L nephrectomy in 2014, who presents for follow-up of her SOB and chest pain.  I saw her in clinic 02/28/19 for above complaints. Chest pain was atypical, burning, did not feel like prior angina, more like extra fluid on board. BP significantly elevated with SBPs in the 160s-180s. Decision made to wean off clonidine. She was transition from losartan to valsartan for better BP control.   Her last ischemic evaluation was a NST 02/2017 which showed EF 51%, fixed defect in the inferolateral wall which was felt to be reflective of prior inferior MI. Her last echocardiogram 02/2017 showed EF 45-50%, hypokinesis and scarring of basal inferolateral and inferior myocardium c/w infarction in the RCA, and G1DD.   She returns today for follow-up of her chest pain, SOB, and HTN. She reports improvement in her chest pain with increasing her antacid from QOD to daily. She has had a couple episodes over the past week which are left sided, aching, and last seconds before resolving spontaneously. She still feels congested in her chest despite completing almost 10 days of doxycycline prescribed by her PCP last week for possible COPD exacerbation. She still is coughing up mucus. No complaints of fevers. She has continued to feel fatigued. She is only sleeping a few hours at night and multiple cat naps during the day. We discussed healthy sleep habits and I suggested melatonin as  a sleep aid. She denies dizziness, lightheadedness, syncope, palpitations, and LE edema.     Past Medical History:  Diagnosis Date  . Alcoholism ([place])    recovering since 2000  . Anxiety and depression   . Arthritis BACK  . Bipolar disorder ([place])   . Blepharospasm LEFT EYE  . CHF exacerbation ([place]) 02/24/2017  . Chronic back pain   . Coronary artery disease CARDIOLOGIST- DR Martinique--- LAST VISIT NOTE 09-07-2009  W/ CHART  . Emphysema   . History of alcohol abuse RECOVERING SINCE 2000  . History of Left renal mass 03/02/2012   underwent partial nephrectomy  . HTN (hypertension)   . Idiopathic acute facial nerve palsy LEFT SIDE--  BOTOX THERAPY  . Inferior MI ([place]) 2000--  POST PTCA W/ STENT X1  . Osteoporosis   . Other and unspecified general anesthetics causing adverse effect in therapeutic use post op delirium--  last anes record w/ chart  from   09-22-2009 (spinal w/ light sedation)  . Peripheral vascular disease ([place]) POST RIGHT CAROTID SURG.  1995  . Renal cell carcinoma ([place]) 07/09/12   Left mass  . Rosacea LEFT FACIAL RASH  . S/P radiation therapy 02/21/2012   38.75 Gy HDR 5 Fractions- vaginal cuff  . Scoliosis   . Seizures ([place])    x 1 after abrupt discontinuation of  Clonidine  . Status post carotid endarterectomy RIGHT --  1995  . Status post primary angioplasty with coronary stent 2000--  POST INFERIOR MI  . Unstable balance WALKS W/ CANE  . Vaginal cancer [facility])     Past Surgical History:  Procedure Laterality Date  . BREAST EXCISIONAL BIOPSY Left 2019   b9 axilla Bx X 3  . CAROTID ENDARTERECTOMY  1995   RIGHT  . CATARACT EXTRACTION W/ INTRAOCULAR LENS  IMPLANT, BILATERAL    . CERVICAL CONIZATION W/BX  09-23-2008  . CORONARY ANGIOPLASTY WITH STENT PLACEMENT  2000-   INFERIOR MI   X1 STENT TO RCA  . EUS N/A 03/05/2012   Procedure: FULL UPPER ENDOSCOPIC ULTRASOUND (EUS) RADIAL and EGD;  Surgeon: Milus Banister, MD;  Location: [facility];  Service: Endoscopy;   Laterality: N/A;  ercp scope first than eus scope  . HEMIARTHROPLASTY HIP  12-26-2008   LEFT FEMORAL NECK FX  . ORIF HIP FRACTURE  02-13-2007   RIGHT FEMORAL NECK FX  . ORIF RIGHT DISTAL RADIUS AND RIGHT PROXIMAL HUMEROUS NECK FX'S  10-10-2005  . RIGHT SHOULDER SURG.  2007  . ROBOTIC ASSITED PARTIAL NEPHRECTOMY Left 07/09/2012   Procedure: ROBOTIC ASSITED PARTIAL NEPHRECTOMY;  Surgeon: Dutch Gray, MD;  Location: [facility];  Service: Urology;  Laterality: Left;  . TOTAL HIP ARTHROPLASTY  04-15-2008   POST FAILED  RIGHT HIP ORIF FEMORAL FX  . TOTAL KNEE ARTHROPLASTY  09-22-2009   RIGHT  . UPPER RIGHT VAGINAL REGION  12/28/11   BIOPSY: SQUAMOUS CELL CARCINOMA  . VAGINAL HYSTERECTOMY  07/06/2009   Secondary to dysplasia     Current Outpatient Medications  Medication Sig Dispense Refill  . albuterol (PROVENTIL HFA;VENTOLIN HFA) 108 (90 BASE) MCG/ACT inhaler Inhale 2 puffs into the lungs every 6 (six) hours as needed for wheezing.    Marland Kitchen amLODipine-atorvastatin (CADUET) 5-20 MG tablet Take 1 tablet by mouth daily.     . ARIPiprazole (ABILIFY) 20 MG tablet Take 10 mg by mouth every morning.     Marland Kitchen aspirin EC 81 MG tablet Take 81 mg by mouth every morning.     . Calcium Carbonate-Vitamin D 600-400 MG-UNIT per tablet Take 1 tablet by mouth 2 (two) times daily.     . Cholecalciferol (VITAMIN D3) 2000 UNITS TABS Take 2,000 Units by mouth 2 (two) times daily.     . cloNIDine (CATAPRES) 0.1 MG tablet Take 1 tablet (0.1 mg total) by mouth 2 (two) times daily. 60 tablet 0  . dexlansoprazole (DEXILANT) 60 MG capsule Take 60 mg by mouth daily.    Marland Kitchen docusate sodium (COLACE) 100 MG capsule Take 100 mg by mouth as directed.    . doxycycline (VIBRA-TABS) 100 MG tablet doxycycline hyclate 100 mg tablet  TAKE 1 TABLET BY MOUTH EVERY 12 HOURS FOR 10 DAYS    . furosemide (LASIX) 20 MG tablet Take 20 mg by mouth every morning.    . lamoTRIgine (LAMICTAL) 100 MG tablet Take 200 mg by mouth 2 (two) times daily.       . mometasone-formoterol (DULERA) 100-5 MCG/ACT AERO Inhale 2 puffs into the lungs 2 (two) times daily. 1 Inhaler 3  . Multiple Vitamin (MULTIVITAMIN) capsule Take 1 capsule by mouth every morning.     . nitroGLYCERIN (NITROSTAT) 0.4 MG SL tablet Place 1 tablet (0.4 mg total) under the tongue every 5 (five) minutes as needed for chest pain. 25 tablet 3  . oxyCODONE-acetaminophen (PERCOCET) 10-325 MG per tablet Take 1 tablet by mouth every 4 (four) hours.     . polyethylene glycol (MIRALAX / GLYCOLAX) packet Take 17 g by mouth  daily as needed for mild constipation or moderate constipation.     . potassium chloride (KLOR-CON) 8 MEQ tablet Take 8 mEq by mouth every morning.     . sertraline (ZOLOFT) 50 MG tablet Take 50 mg by mouth every morning.     . tiotropium (SPIRIVA) 18 MCG inhalation capsule Place 18 mcg into inhaler and inhale every evening.     Marland Kitchen tiZANidine (ZANAFLEX) 4 MG tablet tizanidine 4 mg tablet  TAKE 1 TABLET BY MOUTH 4TIMES A DAY AS NEEDED FOR SPASMS    . valsartan (DIOVAN) 320 MG tablet Take 1 tablet (320 mg total) by mouth daily. 30 tablet 6   No current facility-administered medications for this visit.    Allergies:   Patient has no known allergies.    Social History:  The patient  reports that she quit smoking about 8 years ago. Her smoking use included cigarettes. She has a 100.00 pack-year smoking history. She has never used smokeless tobacco. She reports previous alcohol use. She reports that she does not use drugs.   Family History:  The patient's family history includes Hypertension in her father and mother.    ROS:  Please see the history of present illness.   Otherwise, review of systems are positive for none.   All other systems are reviewed and negative.    PHYSICAL EXAM: VS:  BP 120/60   Pulse 60   Temp (!) 97.1 F (36.2 C)   Ht 5\' 6"  (1.676 m)   Wt 167 lb 3.2 oz (75.8 kg)   SpO2 97%   BMI 26.99 kg/m  , BMI Body mass index is 26.99 kg/m. GEN: Well  nourished, well developed, in no acute distress HEENT: sclera anicteric  Neck: no JVD, carotid bruits, or masses Cardiac: RRR; no murmurs, rubs, or gallops, no edema  Respiratory:  Scattered wheezing with mild rhonchi in LLL. GI: soft, nontender, nondistended, + BS MS: no deformity or atrophy Skin: warm and dry, no rash Neuro:  Strength and sensation are intact Psych: euthymic mood, full affect   EKG:  EKG is ordered today.   Recent Labs: 03/06/2019: BNP WILL FOLLOW; BUN 25; Creatinine, Ser 0.90; Potassium 4.6; Sodium 139    Lipid Panel    Component Value Date/Time   CHOL  12/25/2008 0550    161        ATP III CLASSIFICATION:  <200     mg/dL   Desirable  200-239  mg/dL   Borderline High  >=240    mg/dL   High          TRIG 39 12/25/2008 0550   HDL 102 12/25/2008 0550   CHOLHDL 1.6 12/25/2008 0550   VLDL 8 12/25/2008 0550   LDLCALC  12/25/2008 0550    51        Total Cholesterol/HDL:CHD Risk Coronary Heart Disease Risk Table                     Men   Women  1/2 Average Risk   3.4   3.3  Average Risk       5.0   4.4  2 X Average Risk   9.6   7.1  3 X Average Risk  23.4   11.0        Use the calculated Patient Ratio above and the CHD Risk Table to determine the patient's CHD Risk.        ATP III CLASSIFICATION (LDL):  <100  mg/dL   Optimal  100-129  mg/dL   Near or Above                    Optimal  130-159  mg/dL   Borderline  160-189  mg/dL   High  >190     mg/dL   Very High      Wt Readings from Last 3 Encounters:  03/06/19 167 lb 3.2 oz (75.8 kg)  02/28/19 170 lb 3.2 oz (77.2 kg)  10/01/18 172 lb 6.4 oz (78.2 kg)      Other studies Reviewed: Additional studies/ records that were reviewed today include:   NST 02/2017: IMPRESSION: 1. Fixed defect in the inferolateral wall, likely old infarct. There appears to be some mild peri-infarct ischemia in the adjacent lateral wall.  2. Decreased wall motion and thickening in the inferolateral  wall.  3. Left ventricular ejection fraction 51%  4. Non invasive risk stratification*: High   Echocardiogram 02/2017: - Left ventricle: The cavity size was normal. There was mild  concentric hypertrophy. Systolic function was mildly reduced. The  estimated ejection fraction was in the range of 45% to 50%.  Hypokinesis and scarring of the basal-midinferolateral and  inferior myocardium; consistent with infarction in the  distribution of the right coronary or left circumflex coronary  artery. Doppler parameters are consistent with abnormal left  ventricular relaxation (grade 1 diastolic dysfunction).  - Mitral valve: Calcified annulus.     ASSESSMENT AND PLAN:  1. Acute on chronic combined CHF: EF 45-50% in 2019. Not on BBlocker due to baseline bradycardia. Chest pain has improved but still having some SOB and chest congestion despite completing 9/10 day course of doxycycline for possible COPD exacerbation. At this point, not convinced this is fluid - Will check a BMET for close monitoring of kidney function/electrolytes, as well as a BNP to assess volume status - Will update an echocardiogram to re-evaluate LV function - Continue lasix 20mg  daily with plans to take additional 20mg  for weight gain of 3lbs in 1 day or 5lbs in 1 week   2. HTN: BP improved today but after further clarification she reports she did not wean her clonidine after her last visit and has been taking 0.2mg  BID. Plan to wean off clonidine.  - Decrease clonidine to 0.1mg  BID - will have her follow-up with pharmacy for assistance with HTN and plans to wean off clonidine - Continue valsartan 320mg  daily and caduet (amlodipine 5mg  daily).  3. CAD s/p MI with PCI to RCA in 2000: CP improved with increasing antacid from QOD to daily. Still had a couple episodes which are fleeting - she states this is not c/w prior angina; perhaps related to #1. Not on BBlocker due to baseline bradycardia - Continue  aspirin and statin  4. Carotid artery stenosis: s/p R CEA 1995.  - Ordered for surveillance dopplers 06/2019 - Continue aspirin and statin  5. PAD: moderate bilateral fem-pop disease noted on last ABI 02/2018.  - Will arrange surveillance ABI's  - Continue aspirin and statin  6. HLD: - Continue caduet (atorvastatin 20mg  daily)   Current medicines are reviewed at length with the patient today.  The patient does not have concerns regarding medicines.  The following changes have been made:  As above  Labs/ tests ordered today include:   Orders Placed This Encounter  Procedures  . Basic metabolic panel  . Brain natriuretic peptide  . EKG 12-Lead     Disposition:   FU with  pharmacy for HTN check next week and Dr. Martinique in 3 months   Signed, Abigail Butts, PA-C  03/07/2019 9:03 AM

## 2019-03-07 ENCOUNTER — Encounter: Payer: Self-pay | Admitting: Medical

## 2019-03-07 DIAGNOSIS — I5032 Chronic diastolic (congestive) heart failure: Secondary | ICD-10-CM | POA: Insufficient documentation

## 2019-03-07 LAB — BASIC METABOLIC PANEL
BUN/Creatinine Ratio: 28 (ref 12–28)
BUN: 25 mg/dL (ref 8–27)
CO2: 22 mmol/L (ref 20–29)
Calcium: 10.4 mg/dL — ABNORMAL HIGH (ref 8.7–10.3)
Chloride: 102 mmol/L (ref 96–106)
Creatinine, Ser: 0.9 mg/dL (ref 0.57–1.00)
GFR calc Af Amer: 73 mL/min/{1.73_m2} (ref >59)
GFR calc non Af Amer: 63 mL/min/{1.73_m2} (ref >59)
Glucose: 92 mg/dL (ref 65–99)
Potassium: 4.6 mmol/L (ref 3.5–5.2)
Sodium: 139 mmol/L (ref 134–144)

## 2019-03-07 LAB — BRAIN NATRIURETIC PEPTIDE: BNP: 78.3 pg/mL (ref 0.0–100.0)

## 2019-03-11 DIAGNOSIS — I10 Essential (primary) hypertension: Secondary | ICD-10-CM | POA: Diagnosis not present

## 2019-03-11 DIAGNOSIS — R0600 Dyspnea, unspecified: Secondary | ICD-10-CM | POA: Diagnosis not present

## 2019-03-11 DIAGNOSIS — J441 Chronic obstructive pulmonary disease with (acute) exacerbation: Secondary | ICD-10-CM | POA: Diagnosis not present

## 2019-03-20 ENCOUNTER — Other Ambulatory Visit: Payer: Self-pay

## 2019-03-20 ENCOUNTER — Ambulatory Visit (HOSPITAL_COMMUNITY): Payer: Medicare Other | Attending: Cardiovascular Disease

## 2019-03-20 DIAGNOSIS — R0600 Dyspnea, unspecified: Secondary | ICD-10-CM | POA: Diagnosis not present

## 2019-03-20 DIAGNOSIS — I2510 Atherosclerotic heart disease of native coronary artery without angina pectoris: Secondary | ICD-10-CM | POA: Insufficient documentation

## 2019-03-20 DIAGNOSIS — I5042 Chronic combined systolic (congestive) and diastolic (congestive) heart failure: Secondary | ICD-10-CM

## 2019-03-21 DIAGNOSIS — M25562 Pain in left knee: Secondary | ICD-10-CM | POA: Insufficient documentation

## 2019-03-25 DIAGNOSIS — M25562 Pain in left knee: Secondary | ICD-10-CM | POA: Diagnosis not present

## 2019-03-27 ENCOUNTER — Encounter (HOSPITAL_COMMUNITY): Payer: Medicare Other

## 2019-03-27 DIAGNOSIS — M25562 Pain in left knee: Secondary | ICD-10-CM | POA: Diagnosis not present

## 2019-03-29 DIAGNOSIS — Z23 Encounter for immunization: Secondary | ICD-10-CM | POA: Diagnosis not present

## 2019-04-02 ENCOUNTER — Ambulatory Visit: Payer: Medicare Other

## 2019-04-02 DIAGNOSIS — M25562 Pain in left knee: Secondary | ICD-10-CM | POA: Diagnosis not present

## 2019-04-08 ENCOUNTER — Other Ambulatory Visit: Payer: Self-pay | Admitting: Orthopedic Surgery

## 2019-04-08 DIAGNOSIS — M25562 Pain in left knee: Secondary | ICD-10-CM | POA: Diagnosis not present

## 2019-04-17 DIAGNOSIS — J441 Chronic obstructive pulmonary disease with (acute) exacerbation: Secondary | ICD-10-CM | POA: Diagnosis not present

## 2019-04-24 ENCOUNTER — Other Ambulatory Visit: Payer: Self-pay

## 2019-04-24 ENCOUNTER — Ambulatory Visit
Admission: RE | Admit: 2019-04-24 | Discharge: 2019-04-24 | Disposition: A | Discharge status: Home / Self Care | Payer: Medicare Other | Source: Ambulatory Visit | Attending: Orthopedic Surgery | Admitting: Orthopedic Surgery

## 2019-04-24 DIAGNOSIS — M25562 Pain in left knee: Secondary | ICD-10-CM

## 2019-04-24 DIAGNOSIS — S82145A Nondisplaced bicondylar fracture of left tibia, initial encounter for closed fracture: Secondary | ICD-10-CM | POA: Diagnosis not present

## 2019-04-29 DIAGNOSIS — M25562 Pain in left knee: Secondary | ICD-10-CM | POA: Diagnosis not present

## 2019-04-29 DIAGNOSIS — S82102A Unspecified fracture of upper end of left tibia, initial encounter for closed fracture: Secondary | ICD-10-CM | POA: Diagnosis not present

## 2019-05-02 ENCOUNTER — Encounter: Payer: Self-pay | Admitting: Critical Care Medicine

## 2019-05-02 ENCOUNTER — Other Ambulatory Visit: Payer: Self-pay

## 2019-05-02 ENCOUNTER — Ambulatory Visit (INDEPENDENT_AMBULATORY_CARE_PROVIDER_SITE_OTHER): Payer: Medicare Other | Admitting: Critical Care Medicine

## 2019-05-02 VITALS — BP 142/62 | HR 73 | Temp 97.5°F | Ht 63.0 in | Wt 167.6 lb

## 2019-05-02 DIAGNOSIS — J431 Panlobular emphysema: Secondary | ICD-10-CM | POA: Diagnosis not present

## 2019-05-02 DIAGNOSIS — M4120 Other idiopathic scoliosis, site unspecified: Secondary | ICD-10-CM

## 2019-05-02 DIAGNOSIS — Z87891 Personal history of nicotine dependence: Secondary | ICD-10-CM | POA: Diagnosis not present

## 2019-05-02 NOTE — Patient Instructions (Addendum)
Thank you for visiting Dr. Carlis Abbott at [facility]. We recommend the following: Orders Placed This Encounter  Procedures  . Pulmonary function test   Orders Placed This Encounter  Procedures  . Pulmonary function test    Standing Status:   Future    Standing Expiration Date:   05/01/2020    Order Specific Question:   Where should this test be performed?    Answer:   [facility]    Order Specific Question:   Full PFT: includes the following: basic spirometry, spirometry pre & post bronchodilator, diffusion capacity (DLCO), lung volumes    Answer:   Full PFT    Cut back on carbonated beverages, especially at night. Try not to eat or drink any thing when you are about to go back to recline in your recliner.   No orders of the defined types were placed in this encounter.   Return in about 2 months (around 07/02/2019).    Please do your part to reduce the spread of COVID-19.  Food Choices for Gastroesophageal Reflux Disease, Adult When you have gastroesophageal reflux disease (GERD), the foods you eat and your eating habits are very important. Choosing the right foods can help ease your discomfort. Think about working with a nutrition specialist (dietitian) to help you make good choices. What are tips for following this plan?  Meals  Choose healthy foods that are low in fat, such as fruits, vegetables, whole grains, low-fat dairy products, and lean meat, fish, and poultry.  Eat small meals often instead of 3 large meals a day. Eat your meals slowly, and in a place where you are relaxed. Avoid bending over or lying down until 2-3 hours after eating.  Avoid eating meals 2-3 hours before bed.  Avoid drinking a lot of liquid with meals.  Cook foods using methods other than frying. Bake, grill, or broil food instead.  Avoid or limit: ? Chocolate. ? Peppermint or spearmint. ? Alcohol. ? Pepper. ? Black and decaffeinated coffee. ? Black and decaffeinated tea. ? Bubbly  (carbonated) soft drinks. ? Caffeinated energy drinks and soft drinks.  Limit high-fat foods such as: ? Fatty meat or fried foods. ? Whole milk, cream, butter, or ice cream. ? Nuts and nut butters. ? Pastries, donuts, and sweets made with butter or shortening.  Avoid foods that cause symptoms. These foods may be different for everyone. Common foods that cause symptoms include: ? Tomatoes. ? Oranges, lemons, and limes. ? Peppers. ? Spicy food. ? Onions and garlic. ? Vinegar. Lifestyle  Maintain a healthy weight. Ask your doctor what weight is healthy for you. If you need to lose weight, work with your doctor to do so safely.  Exercise for at least 30 minutes for 5 or more days each week, or as told by your doctor.  Wear loose-fitting clothes.  Do not smoke. If you need help quitting, ask your doctor.  Sleep with the head of your bed higher than your feet. Use a wedge under the mattress or blocks under the bed frame to raise the head of the bed. Summary  When you have gastroesophageal reflux disease (GERD), food and lifestyle choices are very important in easing your symptoms.  Eat small meals often instead of 3 large meals a day. Eat your meals slowly, and in a place where you are relaxed.  Limit high-fat foods such as fatty meat or fried foods.  Avoid bending over or lying down until 2-3 hours after eating.  Avoid peppermint and spearmint,  caffeine, alcohol, and chocolate. This information is not intended to replace advice given to you by your health care provider. Make sure you discuss any questions you have with your health care provider. Document Revised: 04/26/2018 Document Reviewed: 02/09/2016 Elsevier Patient Education  [place].

## 2019-05-02 NOTE — Progress Notes (Signed)
Synopsis: Referred in April 2021 for DOE, COPD by Harlan Stains, MD.  Subjective:   PATIENT ID: [name] GENDER: female DOB: 25-Oct-1945, MRN: [id number]  Chief Complaint  Patient presents with  . Consult    Patient has shortness of breath for about 3-4 months, exertion makes it worse. Denies cough.     [name] is a 74 year old woman with a history of tobacco abuse and emphysema who presents for evaluation of shortness of breath.  She is accompanied today by her friend [name].  She was diagnosed with COPD many years ago and is maintained on Dulera twice daily, Spiriva once daily, and albuterol as needed, which she uses 2-3 times per day.  Recently she has not noticed a difference with using her albuterol.  She was most recently on a prednisone taper in February, and was on 1 about 1 to 2 months prior to that.  She feels like her breathing is perfect when she is on prednisone.  She has been noticing recently that she gets out of breath faster when she is walking.  She has episodes of shortness of breath that come on suddenly.  In the middle of the night when she wakes up to drink a Coke she notices sudden shortness of breath after this when she gets back in her recliner.  These do not wake her up from sleep or occur when she is walking to get a drink.  She denies wheezing.  No cough or sputum.  She has shortness of breath episodes during the day that she does not know if there is anything that precipitates them.  She drinks Coke very frequently and seldom drinks water.  She is unable to sleep in a bed and frequently is in her recliner throughout the day.  She is a history of 3 packs/day smoking x50 years prior to quitting in 2013.  She has a history of chronic GERD for which she takes Dexilant.  She denies heartburn, reflux sensation, or indigestion.  She has a history of HFpEF, and recently required increased Lasix dosing due to edema.  Her edema has improved.  Due to a leg fracture was  discovered last week she has stopped taking her Lasix because she is unable to go to the bathroom as frequently.  She is also been drinking less due to her mobility issues.  She has chronic scoliosis.  She has never been hospitalized for COPD in the past.  PCP Dr. Orest Dikes 03/11/2019 note reviewed.     Past Medical History:  Diagnosis Date  . Alcoholism ([place])    recovering since 2000  . Anxiety and depression   . Arthritis BACK  . Bipolar disorder ([place])   . Blepharospasm LEFT EYE  . CHF exacerbation ([place]) 02/24/2017  . Chronic back pain   . COPD (chronic obstructive pulmonary disease) ([place])   . Coronary artery disease CARDIOLOGIST- DR Martinique--- LAST VISIT NOTE 09-07-2009  W/ CHART  . Emphysema   . History of alcohol abuse RECOVERING SINCE 2000  . History of Left renal mass 03/02/2012   underwent partial nephrectomy  . HTN (hypertension)   . Idiopathic acute facial nerve palsy LEFT SIDE--  BOTOX THERAPY  . Inferior MI ([place]) 2000--  POST PTCA W/ STENT X1  . Osteoporosis   . Other and unspecified general anesthetics causing adverse effect in therapeutic use post op delirium--  last anes record w/ chart  from   09-22-2009 (spinal w/ light sedation)  . Peripheral vascular disease ([place])  POST RIGHT CAROTID SURG.  1995  . Renal cell carcinoma ([place]) 07/09/12   Left mass  . Rosacea LEFT FACIAL RASH  . S/P radiation therapy 02/21/2012   38.75 Gy HDR 5 Fractions- vaginal cuff  . Scoliosis   . Seizures ([place])    x 1 after abrupt discontinuation of  Clonidine  . Status post carotid endarterectomy RIGHT --  1995  . Status post primary angioplasty with coronary stent 2000--  POST INFERIOR MI  . Unstable balance WALKS W/ CANE  . Vaginal cancer ([place])      Family History  Problem Relation Age of Onset  . Hypertension Mother   . Hypertension Father      Past Surgical History:  Procedure Laterality Date  . BREAST EXCISIONAL BIOPSY Left 2019   b9 axilla Bx X 3  . CAROTID ENDARTERECTOMY  1995    RIGHT  . CATARACT EXTRACTION W/ INTRAOCULAR LENS  IMPLANT, BILATERAL    . CERVICAL CONIZATION W/BX  09-23-2008  . CORONARY ANGIOPLASTY WITH STENT PLACEMENT  2000-   INFERIOR MI   X1 STENT TO RCA  . EUS N/A 03/05/2012   Procedure: FULL UPPER ENDOSCOPIC ULTRASOUND (EUS) RADIAL and EGD;  Surgeon: Milus Banister, MD;  Location: [facility];  Service: Endoscopy;  Laterality: N/A;  ercp scope first than eus scope  . HEMIARTHROPLASTY HIP  12-26-2008   LEFT FEMORAL NECK FX  . ORIF HIP FRACTURE  02-13-2007   RIGHT FEMORAL NECK FX  . ORIF RIGHT DISTAL RADIUS AND RIGHT PROXIMAL HUMEROUS NECK FX'S  10-10-2005  . RIGHT SHOULDER SURG.  2007  . ROBOTIC ASSITED PARTIAL NEPHRECTOMY Left 07/09/2012   Procedure: ROBOTIC ASSITED PARTIAL NEPHRECTOMY;  Surgeon: Dutch Gray, MD;  Location: [facility];  Service: Urology;  Laterality: Left;  . TOTAL HIP ARTHROPLASTY  04-15-2008   POST FAILED  RIGHT HIP ORIF FEMORAL FX  . TOTAL KNEE ARTHROPLASTY  09-22-2009   RIGHT  . UPPER RIGHT VAGINAL REGION  12/28/11   BIOPSY: SQUAMOUS CELL CARCINOMA  . VAGINAL HYSTERECTOMY  07/06/2009   Secondary to dysplasia    Social History   Socioeconomic History  . Marital status: Divorced    Spouse name: Not on file  . Number of children: Not on file  . Years of education: Not on file  . Highest education level: Not on file  Occupational History  . Not on file  Tobacco Use  . Smoking status: Former Smoker    Packs/day: 2.00    Years: 50.00    Pack years: 100.00    Types: Cigarettes    Quit date: 01/18/2011    Years since quitting: 8.2  . Smokeless tobacco: Never Used  . Tobacco comment: STATES QUIT SMOKING 01-18-2011  Substance and Sexual Activity  . Alcohol use: Not Currently    Comment: RECOVERING ALCOHOLIC--   QUIT IN [id number]  . Drug use: No  . Sexual activity: Never  Other Topics Concern  . Not on file  Social History Narrative  . Not on file   Social Determinants of Health   Financial Resource Strain:   .  Difficulty of Paying Living Expenses:   Food Insecurity:   . Worried About Charity fundraiser in the Last Year:   . Arboriculturist in the Last Year:   Transportation Needs:   . Film/video editor (Medical):   Marland Kitchen Lack of Transportation (Non-Medical):   Physical Activity:   . Days of Exercise per Week:   . Minutes of  Exercise per Session:   Stress:   . Feeling of Stress :   Social Connections:   . Frequency of Communication with Friends and Family:   . Frequency of Social Gatherings with Friends and Family:   . Attends Religious Services:   . Active Member of Clubs or Organizations:   . Attends Archivist Meetings:   Marland Kitchen Marital Status:   Intimate Partner Violence:   . Fear of Current or Ex-Partner:   . Emotionally Abused:   Marland Kitchen Physically Abused:   . Sexually Abused:      No Known Allergies   Immunization History  Administered Date(s) Administered  . Fluad Quad(high Dose 65+) 11/01/2018  . Influenza Split 01/18/2008, 11/10/2009  . Influenza, High Dose Seasonal PF 09/30/2011  . Influenza,inj,quad, With Preservative 10/17/2016  . Influenza-Unspecified 12/07/2010, 09/17/2013  . Moderna SARS-COVID-2 Vaccination 03/01/2019, 03/29/2019  . Pneumococcal-Unspecified 01/18/2008, 04/12/2010  . Tdap 04/25/2011  . Zoster 04/28/2010  . Zoster Recombinat (Shingrix) 08/06/2017    Outpatient Medications Prior to Visit  Medication Sig Dispense Refill  . albuterol (PROVENTIL HFA;VENTOLIN HFA) 108 (90 BASE) MCG/ACT inhaler Inhale 2 puffs into the lungs every 6 (six) hours as needed for wheezing.    Marland Kitchen amLODipine-atorvastatin (CADUET) 5-20 MG tablet Take 1 tablet by mouth daily.     . ARIPiprazole (ABILIFY) 20 MG tablet Take 10 mg by mouth every morning.     Marland Kitchen aspirin EC 81 MG tablet Take 81 mg by mouth every morning.     . Calcium Carbonate-Vitamin D 600-400 MG-UNIT per tablet Take 1 tablet by mouth 2 (two) times daily.     . Cholecalciferol (VITAMIN D3) 2000 UNITS TABS Take  2,000 Units by mouth 2 (two) times daily.     . cloNIDine (CATAPRES) 0.1 MG tablet Take 1 tablet (0.1 mg total) by mouth 2 (two) times daily. 60 tablet 0  . dexlansoprazole (DEXILANT) 60 MG capsule Take 60 mg by mouth daily.    Marland Kitchen docusate sodium (COLACE) 100 MG capsule Take 100 mg by mouth as directed.    . doxycycline (VIBRA-TABS) 100 MG tablet doxycycline hyclate 100 mg tablet  TAKE 1 TABLET BY MOUTH EVERY 12 HOURS FOR 10 DAYS    . furosemide (LASIX) 20 MG tablet Take 20 mg by mouth every morning.    . lamoTRIgine (LAMICTAL) 100 MG tablet Take 200 mg by mouth 2 (two) times daily.     . mometasone-formoterol (DULERA) 100-5 MCG/ACT AERO Inhale 2 puffs into the lungs 2 (two) times daily. 1 Inhaler 3  . Multiple Vitamin (MULTIVITAMIN) capsule Take 1 capsule by mouth every morning.     . nitroGLYCERIN (NITROSTAT) 0.4 MG SL tablet Place 1 tablet (0.4 mg total) under the tongue every 5 (five) minutes as needed for chest pain. 25 tablet 3  . oxyCODONE-acetaminophen (PERCOCET) 10-325 MG per tablet Take 1 tablet by mouth every 4 (four) hours.     . polyethylene glycol (MIRALAX / GLYCOLAX) packet Take 17 g by mouth daily as needed for mild constipation or moderate constipation.     . potassium chloride (KLOR-CON) 8 MEQ tablet Take 8 mEq by mouth every morning.     . sertraline (ZOLOFT) 50 MG tablet Take 50 mg by mouth every morning.     . tiotropium (SPIRIVA) 18 MCG inhalation capsule Place 18 mcg into inhaler and inhale every evening.     Marland Kitchen tiZANidine (ZANAFLEX) 4 MG tablet tizanidine 4 mg tablet  TAKE 1 TABLET BY MOUTH 4TIMES A DAY  AS NEEDED FOR SPASMS    . valsartan (DIOVAN) 320 MG tablet Take 1 tablet (320 mg total) by mouth daily. 30 tablet 6   No facility-administered medications prior to visit.    Review of Systems  Constitutional: Negative.  Negative for chills, fever and weight loss.  Respiratory: Positive for shortness of breath. Negative for sputum production.   Cardiovascular: Negative  for chest pain and leg swelling.  Gastrointestinal: Negative for heartburn, nausea and vomiting.  Musculoskeletal: Positive for joint pain. Negative for myalgias.  Neurological: Positive for weakness.  Endo/Heme/Allergies: Negative for environmental allergies.     Objective:   Vitals:   05/02/19 1026  BP: (!) 142/62  Pulse: 73  Temp: (!) 97.5 F (36.4 C)  TempSrc: Temporal  SpO2: 94%  Weight: 167 lb 9.6 oz (76 kg)  Height: 5\' 3"  (1.6 m)   94% on   RA BMI Readings from Last 3 Encounters:  05/02/19 29.69 kg/m  03/06/19 26.99 kg/m  02/28/19 27.47 kg/m   Wt Readings from Last 3 Encounters:  05/02/19 167 lb 9.6 oz (76 kg)  03/06/19 167 lb 3.2 oz (75.8 kg)  02/28/19 170 lb 3.2 oz (77.2 kg)    Physical Exam Vitals reviewed.  Constitutional:      General: She is not in acute distress.    Appearance: She is not ill-appearing.  HENT:     Head: Normocephalic and atraumatic.  Eyes:     General: No scleral icterus. Cardiovascular:     Rate and Rhythm: Normal rate and regular rhythm.     Heart sounds: No murmur.  Pulmonary:     Comments: Breathing comfortably on room air, no conversational dyspnea.  Rales in the left base, otherwise CTA.  Occasional hoarseness. Abdominal:     General: There is no distension.     Palpations: Abdomen is soft.  Musculoskeletal:        General: Swelling present.     Cervical back: Neck supple.     Comments: Severe kyphoscoliosis  Lymphadenopathy:     Cervical: No cervical adenopathy.  Skin:    General: Skin is warm and dry.     Findings: No rash.  Neurological:     Mental Status: She is alert.     Comments: Globally weak  Psychiatric:        Mood and Affect: Mood normal.        Behavior: Behavior normal.      CBC    Component Value Date/Time   WBC 5.9 02/24/2017 2339   RBC 4.62 02/24/2017 2339   HGB 15.2 (H) 02/24/2017 2339   HCT 42.3 02/24/2017 2339   PLT 295 02/24/2017 2339   MCV 91.6 02/24/2017 2339   MCH 32.9  02/24/2017 2339   MCHC 35.9 02/24/2017 2339   RDW 12.7 02/24/2017 2339   LYMPHSABS 1.5 02/24/2017 1902   MONOABS 0.5 02/24/2017 1902   EOSABS 0.3 02/24/2017 1902   BASOSABS 0.0 02/24/2017 1902    CHEMISTRY No results for input(s): NA, K, CL, CO2, GLUCOSE, BUN, CREATININE, CALCIUM, MG, PHOS in the last 168 hours. CrCl cannot be calculated (Patient's most recent lab result is older than the maximum 21 days allowed.).   Chest Imaging- films reviewed: CXR, 2 view 02/25/2019-kyphoscoliosis.  Flattened hemidiaphragms bilaterally.  Increased right upper lobe lucency suspicious for bullous disease.  10/01/2018 LD CT chest-bilateral upper lobe panlobular emphysema, worse on the right.  Bilateral basilar cystic changes.  Mild focal right lower lobe subpleural fibrosis no masses.  Patulous  esophagus.  Significant vascular calcification.  No obvious mediastinal or hilar adenopathy.  Lung RADS 1  Pulmonary Functions Testing Results: No flowsheet data found.    Echocardiogram 03/20/2019: LVEF 60 to 123456, grade 1 diastolic dysfunction.  Severely dilated LA, normal RV.  Mildly elevated PASP.  Normal RA.  Mild MR and TR.      Assessment & Plan:     ICD-10-CM   1. Personal history of tobacco use, presenting hazards to health  Z87.891 Pulmonary function test  2. Panlobular emphysema (HCC)  J43.1 Pulmonary function test  3. Scoliosis (and kyphoscoliosis), idiopathic  M41.20 Pulmonary function test    Episodic shortness of breath.  Given her frequent carbonated beverage intake and symptoms triggered by drinking Coke before laying back down I am concerned that she is having silent reflux.  COPD and cardiac disease should both cause more persistent symptoms that are worse with activity and better with rest. -Discussed the necessary dietary modifications to help with GERD, including significantly limiting her caffeine and carbonated beverage intake.  She should especially avoid drinking Coke in the middle of  the night when she is going to be laying back down.  She may be able to tolerate some carbonation in the day if she is going to be upright for a while. -Recommend maintaining a healthy weight -Reviewed CT scan with her -Continue PPI  Severe emphysema due to previous tobacco use -Continue Dulera twice daily.  Rinse her mouth after every use -Continue Spiriva once daily -Continue albuterol as needed -Continue to avoid tobacco.  Congratulated her on her success with quitting -Follows with lung cancer screening program -PFTs  RTC in 2 months.    Current Outpatient Medications:  .  albuterol (PROVENTIL HFA;VENTOLIN HFA) 108 (90 BASE) MCG/ACT inhaler, Inhale 2 puffs into the lungs every 6 (six) hours as needed for wheezing., Disp: , Rfl:  .  amLODipine-atorvastatin (CADUET) 5-20 MG tablet, Take 1 tablet by mouth daily. , Disp: , Rfl:  .  ARIPiprazole (ABILIFY) 20 MG tablet, Take 10 mg by mouth every morning. , Disp: , Rfl:  .  aspirin EC 81 MG tablet, Take 81 mg by mouth every morning. , Disp: , Rfl:  .  Calcium Carbonate-Vitamin D 600-400 MG-UNIT per tablet, Take 1 tablet by mouth 2 (two) times daily. , Disp: , Rfl:  .  Cholecalciferol (VITAMIN D3) 2000 UNITS TABS, Take 2,000 Units by mouth 2 (two) times daily. , Disp: , Rfl:  .  cloNIDine (CATAPRES) 0.1 MG tablet, Take 1 tablet (0.1 mg total) by mouth 2 (two) times daily., Disp: 60 tablet, Rfl: 0 .  dexlansoprazole (DEXILANT) 60 MG capsule, Take 60 mg by mouth daily., Disp: , Rfl:  .  docusate sodium (COLACE) 100 MG capsule, Take 100 mg by mouth as directed., Disp: , Rfl:  .  doxycycline (VIBRA-TABS) 100 MG tablet, doxycycline hyclate 100 mg tablet  TAKE 1 TABLET BY MOUTH EVERY 12 HOURS FOR 10 DAYS, Disp: , Rfl:  .  furosemide (LASIX) 20 MG tablet, Take 20 mg by mouth every morning., Disp: , Rfl:  .  lamoTRIgine (LAMICTAL) 100 MG tablet, Take 200 mg by mouth 2 (two) times daily. , Disp: , Rfl:  .  mometasone-formoterol (DULERA) 100-5  MCG/ACT AERO, Inhale 2 puffs into the lungs 2 (two) times daily., Disp: 1 Inhaler, Rfl: 3 .  Multiple Vitamin (MULTIVITAMIN) capsule, Take 1 capsule by mouth every morning. , Disp: , Rfl:  .  nitroGLYCERIN (NITROSTAT) 0.4 MG SL tablet, Place  1 tablet (0.4 mg total) under the tongue every 5 (five) minutes as needed for chest pain., Disp: 25 tablet, Rfl: 3 .  oxyCODONE-acetaminophen (PERCOCET) 10-325 MG per tablet, Take 1 tablet by mouth every 4 (four) hours. , Disp: , Rfl:  .  polyethylene glycol (MIRALAX / GLYCOLAX) packet, Take 17 g by mouth daily as needed for mild constipation or moderate constipation. , Disp: , Rfl:  .  potassium chloride (KLOR-CON) 8 MEQ tablet, Take 8 mEq by mouth every morning. , Disp: , Rfl:  .  sertraline (ZOLOFT) 50 MG tablet, Take 50 mg by mouth every morning. , Disp: , Rfl:  .  tiotropium (SPIRIVA) 18 MCG inhalation capsule, Place 18 mcg into inhaler and inhale every evening. , Disp: , Rfl:  .  tiZANidine (ZANAFLEX) 4 MG tablet, tizanidine 4 mg tablet  TAKE 1 TABLET BY MOUTH 4TIMES A DAY AS NEEDED FOR SPASMS, Disp: , Rfl:  .  valsartan (DIOVAN) 320 MG tablet, Take 1 tablet (320 mg total) by mouth daily., Disp: 30 tablet, Rfl: [place] Hiren Peplinski, DO [facility] 05/02/2019 11:19 AM

## 2019-05-06 ENCOUNTER — Ambulatory Visit: Payer: Medicare Other | Admitting: Critical Care Medicine

## 2019-05-15 DIAGNOSIS — M5030 Other cervical disc degeneration, unspecified cervical region: Secondary | ICD-10-CM | POA: Diagnosis not present

## 2019-05-15 DIAGNOSIS — Z79891 Long term (current) use of opiate analgesic: Secondary | ICD-10-CM | POA: Diagnosis not present

## 2019-05-15 DIAGNOSIS — M5136 Other intervertebral disc degeneration, lumbar region: Secondary | ICD-10-CM | POA: Diagnosis not present

## 2019-05-15 DIAGNOSIS — M545 Low back pain: Secondary | ICD-10-CM | POA: Diagnosis not present

## 2019-05-16 ENCOUNTER — Ambulatory Visit (INDEPENDENT_AMBULATORY_CARE_PROVIDER_SITE_OTHER): Payer: Medicare Other | Admitting: Pharmacist Clinician (PhC)/ Clinical Pharmacy Specialist

## 2019-05-16 ENCOUNTER — Other Ambulatory Visit: Payer: Self-pay

## 2019-05-16 DIAGNOSIS — I6523 Occlusion and stenosis of bilateral carotid arteries: Secondary | ICD-10-CM

## 2019-05-16 DIAGNOSIS — I10 Essential (primary) hypertension: Secondary | ICD-10-CM

## 2019-05-16 NOTE — Progress Notes (Signed)
05/17/2019 [name] 07/23/1945 [id number]   HPI:  [name] is a 74 y.o. female patient of Dr Martinique, with a [place] below who presents today for hypertension clinic evaluation.  She was last seen in the office in February by Roby Lofts PA for shortness of breath and chest pain. At that time it was noted she had not been weaning off her clonidine as previously directed.  She was on 0.2 mg bid and was told to cut back to 01 mg bid at that time.  An appointment was scheduled with [facility] to help with this and to make sure her BP did not spike up after discontinuing.  Today she is in the office with her friend [name].  She had a recent fall, resulting in a broken leg.  She continues to wear a soft cast and states she goes back to the MD in another 3 weeks for follow up.  Her blood pressure in the office today is excellent, and she notes that she has about 3-4 tablets remaining of the clonidine.  She currently takes this at 9 am and 7 pm daily.     Past Medical History: ASCVD S/p inferior MI with PCI to RCA in 2000  Combined chronic HF EF at 45-50% in 2019  Carotid stenosis CEA in 1995  hyperlipidemia 1?2020: TC 155, TG 39, HDL 86, LDL 62 - on atorvastatin 20  PAD Moderate bilateral fem-pop disease  S/p renal CA Partial left nephrectomy in 2014     Blood Pressure Goal:  130/80  Current Medications: amlodipine 5 mg, clonidone 0.1 mg bid, furosemide (on hold right now due to broken leg), valsartan 320 mg  Family Hx: mother had hypertension, died at 40 from heart disease; father died at 63 from emphysema (somker); 6 siblings, 68 still living (patient oldest); no children  Social Hx: quit smoking 8 years ago; no alcohol; drinks 2 16-10 oz Coke Zero daily  Diet: eating more take out due to leg; doesn't add salt  Exercise: not currently due to broken leg - goes back in 3 weeks for x-rays; currently in soft cast  Home BP readings: none with her today  Intolerances: nkda  Labs:  2/21: Na 139, K 4.6, Glu 92, BUN 25, SCr 0.9, GFR 63  Wt Readings from Last 3 Encounters:  05/16/19 170 lb (77.1 kg)  05/02/19 167 lb 9.6 oz (76 kg)  03/06/19 167 lb 3.2 oz (75.8 kg)   BP Readings from Last 3 Encounters:  05/16/19 110/60  05/02/19 (!) 142/62  03/06/19 120/60   Pulse Readings from Last 3 Encounters:  05/16/19 70  05/02/19 73  03/06/19 60    Current Outpatient Medications  Medication Sig Dispense Refill  . albuterol (PROVENTIL HFA;VENTOLIN HFA) 108 (90 BASE) MCG/ACT inhaler Inhale 2 puffs into the lungs every 6 (six) hours as needed for wheezing.    Marland Kitchen amLODipine-atorvastatin (CADUET) 5-20 MG tablet Take 1 tablet by mouth daily.     . ARIPiprazole (ABILIFY) 20 MG tablet Take 10 mg by mouth every morning.     Marland Kitchen aspirin EC 81 MG tablet Take 81 mg by mouth every morning.     . Calcium Carbonate-Vitamin D 600-400 MG-UNIT per tablet Take 1 tablet by mouth 2 (two) times daily.     . Cholecalciferol (VITAMIN D3) 2000 UNITS TABS Take 2,000 Units by mouth 2 (two) times daily.     . cloNIDine (CATAPRES) 0.1 MG tablet Take 1 tablet (0.1 mg total)  by mouth 2 (two) times daily. 60 tablet 0  . dexlansoprazole (DEXILANT) 60 MG capsule Take 60 mg by mouth daily.    Marland Kitchen docusate sodium (COLACE) 100 MG capsule Take 100 mg by mouth as directed.    . furosemide (LASIX) 20 MG tablet Take 20 mg by mouth every morning.    . lamoTRIgine (LAMICTAL) 100 MG tablet Take 200 mg by mouth 2 (two) times daily.     . mometasone-formoterol (DULERA) 100-5 MCG/ACT AERO Inhale 2 puffs into the lungs 2 (two) times daily. 1 Inhaler 3  . Multiple Vitamin (MULTIVITAMIN) capsule Take 1 capsule by mouth every morning.     . nitroGLYCERIN (NITROSTAT) 0.4 MG SL tablet Place 1 tablet (0.4 mg total) under the tongue every 5 (five) minutes as needed for chest pain. 25 tablet 3  . oxyCODONE-acetaminophen (PERCOCET) 10-325 MG per tablet Take 1 tablet by mouth every 4 (four) hours.     . polyethylene glycol (MIRALAX /  GLYCOLAX) packet Take 17 g by mouth daily as needed for mild constipation or moderate constipation.     . potassium chloride (KLOR-CON) 8 MEQ tablet Take 8 mEq by mouth every morning.     . sertraline (ZOLOFT) 50 MG tablet Take 50 mg by mouth every morning.     . tiotropium (SPIRIVA) 18 MCG inhalation capsule Place 18 mcg into inhaler and inhale every evening.     Marland Kitchen tiZANidine (ZANAFLEX) 4 MG tablet tizanidine 4 mg tablet  TAKE 1 TABLET BY MOUTH 4TIMES A DAY AS NEEDED FOR SPASMS    . valsartan (DIOVAN) 320 MG tablet Take 1 tablet (320 mg total) by mouth daily. 30 tablet 6   No current facility-administered medications for this visit.    No Known Allergies  Past Medical History:  Diagnosis Date  . Alcoholism ([place])    recovering since 2000  . Anxiety and depression   . Arthritis BACK  . Bipolar disorder ([place])   . Blepharospasm LEFT EYE  . CHF exacerbation ([place]) 02/24/2017  . Chronic back pain   . COPD (chronic obstructive pulmonary disease) ([place])   . Coronary artery disease CARDIOLOGIST- DR Martinique--- LAST VISIT NOTE 09-07-2009  W/ CHART  . Emphysema   . History of alcohol abuse RECOVERING SINCE 2000  . History of Left renal mass 03/02/2012   underwent partial nephrectomy  . HTN (hypertension)   . Idiopathic acute facial nerve palsy LEFT SIDE--  BOTOX THERAPY  . Inferior MI ([place]) 2000--  POST PTCA W/ STENT X1  . Osteoporosis   . Other and unspecified general anesthetics causing adverse effect in therapeutic use post op delirium--  last anes record w/ chart  from   09-22-2009 (spinal w/ light sedation)  . Peripheral vascular disease ([place]) POST RIGHT CAROTID SURG.  1995  . Renal cell carcinoma ([place]) 07/09/12   Left mass  . Rosacea LEFT FACIAL RASH  . S/P radiation therapy 02/21/2012   38.75 Gy HDR 5 Fractions- vaginal cuff  . Scoliosis   . Seizures ([place])    x 1 after abrupt discontinuation of  Clonidine  . Status post carotid endarterectomy RIGHT --  1995  . Status post primary  angioplasty with coronary stent 2000--  POST INFERIOR MI  . Unstable balance WALKS W/ CANE  . Vaginal cancer (HCC)     Blood pressure 110/60, pulse 70, temperature 98 F (36.7 C), resp. rate 16, height 5\' 3"  (1.6 m), weight 170 lb (77.1 kg), SpO2 90 %.  HTN (hypertension) Patient  with essential hypertension, currently well controlled.  Will have her continue with the amlodipine 5 mg and valsartan 320 mg daily.  I have asked her to take the clonidine 0.1 mg once daily until the remaining 3-4 tablets are gone, then do not refill.  She does not need any further blood pressure control and has a higher risk of falls/dizziness with the clonidine.  Patient aware to monitor home BP 2-3 times per week and inform office should she see readings consistently > [id number] systolic.     Tommy Medal PharmD CPP [facility] [address] [place], [place] [address] [phone]

## 2019-05-16 NOTE — Patient Instructions (Addendum)
  Check your blood pressure at home 3-4 times each week and keep record of the readings.  If the weekly averages remain < 140/90 then you are doing fine.  If they start trending above 140/90 please give [place] a call.   Take your BP meds as follows:  Take the clonidine 0.1 mg only in the mornings until it's gone.  Do not refill this medication  Continue with all other medications  Bring all of your meds, your BP cuff and your record of home blood pressures to your next appointment.  Exercise as you're able, try to walk approximately 30 minutes per day.  Keep salt intake to a minimum, especially watch canned and prepared boxed foods.  Eat more fresh fruits and vegetables and fewer canned items.  Avoid eating in fast food restaurants.    HOW TO TAKE YOUR BLOOD PRESSURE: . Rest 5 minutes before taking your blood pressure. .  Don't smoke or drink caffeinated beverages for at least 30 minutes before. . Take your blood pressure before (not after) you eat. . Sit comfortably with your back supported and both feet on the floor (don't cross your legs). . Elevate your arm to heart level on a table or a desk. . Use the proper sized cuff. It should fit smoothly and snugly around your bare upper arm. There should be enough room to slip a fingertip under the cuff. The bottom edge of the cuff should be 1 inch above the crease of the elbow. . Ideally, take 3 measurements at one sitting and record the average.

## 2019-05-17 ENCOUNTER — Other Ambulatory Visit: Payer: Self-pay | Admitting: Medical

## 2019-05-17 DIAGNOSIS — I739 Peripheral vascular disease, unspecified: Secondary | ICD-10-CM

## 2019-05-17 NOTE — Assessment & Plan Note (Signed)
Patient with essential hypertension, currently well controlled.  Will have her continue with the amlodipine 5 mg and valsartan 320 mg daily.  I have asked her to take the clonidine 0.1 mg once daily until the remaining 3-4 tablets are gone, then do not refill.  She does not need any further blood pressure control and has a higher risk of falls/dizziness with the clonidine.  Patient aware to monitor home BP 2-3 times per week and inform office should she see readings consistently > [id number] systolic.

## 2019-05-20 ENCOUNTER — Other Ambulatory Visit: Payer: Self-pay

## 2019-05-20 ENCOUNTER — Ambulatory Visit (HOSPITAL_COMMUNITY)
Admission: RE | Admit: 2019-05-20 | Discharge: 2019-05-20 | Disposition: A | Discharge status: Home / Self Care | Payer: Medicare Other | Source: Ambulatory Visit | Attending: Cardiology | Admitting: Cardiology

## 2019-05-20 DIAGNOSIS — I739 Peripheral vascular disease, unspecified: Secondary | ICD-10-CM | POA: Diagnosis not present

## 2019-05-23 DIAGNOSIS — M810 Age-related osteoporosis without current pathological fracture: Secondary | ICD-10-CM | POA: Diagnosis not present

## 2019-05-23 DIAGNOSIS — J449 Chronic obstructive pulmonary disease, unspecified: Secondary | ICD-10-CM | POA: Diagnosis not present

## 2019-05-23 DIAGNOSIS — K219 Gastro-esophageal reflux disease without esophagitis: Secondary | ICD-10-CM | POA: Diagnosis not present

## 2019-05-23 DIAGNOSIS — E785 Hyperlipidemia, unspecified: Secondary | ICD-10-CM | POA: Diagnosis not present

## 2019-05-23 DIAGNOSIS — F319 Bipolar disorder, unspecified: Secondary | ICD-10-CM | POA: Diagnosis not present

## 2019-05-23 DIAGNOSIS — I10 Essential (primary) hypertension: Secondary | ICD-10-CM | POA: Diagnosis not present

## 2019-05-31 DIAGNOSIS — M810 Age-related osteoporosis without current pathological fracture: Secondary | ICD-10-CM | POA: Diagnosis not present

## 2019-06-04 DIAGNOSIS — G245 Blepharospasm: Secondary | ICD-10-CM | POA: Diagnosis not present

## 2019-06-04 DIAGNOSIS — G518 Other disorders of facial nerve: Secondary | ICD-10-CM | POA: Diagnosis not present

## 2019-06-04 NOTE — Progress Notes (Signed)
Cardiology Office Note   Date:  06/06/2019   ID:  ZAHARRA MADRIAGA, DOB [birth year], MRN [id number]  PCP:  Harlan Stains, MD  Cardiologist:  Makenzey Nanni Martinique, MD EP: None  Chief Complaint  Patient presents with  . Coronary Artery Disease  . Hypertension      History of Present Illness: [name] is a 74 y.o. female with PMH of CAD s/p inferior MI with PCI to RCA in 2000, chronic combined CHF (EF 45-50% 02/2017), carotid artery stenosis s/p R CEA in 1995, PAD with moderate bilateral fem-pop disease medically, HTN, HLD, COPD, and renal CA s/p partial L nephrectomy in 2014.   Her last ischemic evaluation was a NST 02/2017 which showed EF 51%, fixed defect in the inferolateral wall which was felt to be reflective of prior inferior MI. Prior  echocardiogram 02/2017 showed EF 45-50%, hypokinesis and scarring of basal inferolateral and inferior myocardium c/w infarction in the RCA, and G1DD.   Since her last visit Echo was repeated and showed normal LV function. EF 55-60%. No WMA. Recent LE dopplers showed stable moderate disease.   On follow up today she reports she is doing well. Denies any chest pain or SOB. BP still fluctuates but appears to be better. No edema. Tolerating medication well. No palpitations.      Past Medical History:  Diagnosis Date  . Alcoholism ([place])    recovering since 2000  . Anxiety and depression   . Arthritis BACK  . Bipolar disorder ([place])   . Blepharospasm LEFT EYE  . CHF exacerbation ([place]) 02/24/2017  . Chronic back pain   . COPD (chronic obstructive pulmonary disease) ([place])   . Coronary artery disease CARDIOLOGIST- DR Martinez--- LAST VISIT NOTE 09-07-2009  W/ CHART  . Emphysema   . History of alcohol abuse RECOVERING SINCE 2000  . History of Left renal mass 03/02/2012   underwent partial nephrectomy  . HTN (hypertension)   . Idiopathic acute facial nerve palsy LEFT SIDE--  BOTOX THERAPY  . Inferior MI ([place]) 2000--  POST PTCA W/ STENT X1  .  Osteoporosis   . Other and unspecified general anesthetics causing adverse effect in therapeutic use post op delirium--  last anes record w/ chart  from   09-22-2009 (spinal w/ light sedation)  . Peripheral vascular disease ([place]) POST RIGHT CAROTID SURG.  1995  . Renal cell carcinoma ([place]) 07/09/12   Left mass  . Rosacea LEFT FACIAL RASH  . S/P radiation therapy 02/21/2012   38.75 Gy HDR 5 Fractions- vaginal cuff  . Scoliosis   . Seizures ([place])    x 1 after abrupt discontinuation of  Clonidine  . Status post carotid endarterectomy RIGHT --  1995  . Status post primary angioplasty with coronary stent 2000--  POST INFERIOR MI  . Unstable balance WALKS W/ CANE  . Vaginal cancer [facility])     Past Surgical History:  Procedure Laterality Date  . BREAST EXCISIONAL BIOPSY Left 2019   b9 axilla Bx X 3  . CAROTID ENDARTERECTOMY  1995   RIGHT  . CATARACT EXTRACTION W/ INTRAOCULAR LENS  IMPLANT, BILATERAL    . CERVICAL CONIZATION W/BX  09-23-2008  . CORONARY ANGIOPLASTY WITH STENT PLACEMENT  2000-   INFERIOR MI   X1 STENT TO RCA  . EUS N/A 03/05/2012   Procedure: FULL UPPER ENDOSCOPIC ULTRASOUND (EUS) RADIAL and EGD;  Surgeon: Milus Banister, MD;  Location: [facility] END[facility]COPY;  Service: Endoscopy;  Laterality: N/A;  ercp scope first  than eus scope  . HEMIARTHROPLASTY HIP  12-26-2008   LEFT FEMORAL NECK FX  . ORIF HIP FRACTURE  02-13-2007   RIGHT FEMORAL NECK FX  . ORIF RIGHT DISTAL RADIUS AND RIGHT PROXIMAL HUMEROUS NECK FX'S  10-10-2005  . RIGHT SHOULDER SURG.  2007  . ROBOTIC ASSITED PARTIAL NEPHRECTOMY Left 07/09/2012   Procedure: ROBOTIC ASSITED PARTIAL NEPHRECTOMY;  Surgeon: Dutch Gray, MD;  Location: [facility] OR[facility];  Service: Urology;  Laterality: Left;  . TOTAL HIP ARTHROPLASTY  04-15-2008   POST FAILED  RIGHT HIP ORIF FEMORAL FX  . TOTAL KNEE ARTHROPLASTY  09-22-2009   RIGHT  . UPPER RIGHT VAGINAL REGION  12/28/11   BIOPSY: SQUAMOUS CELL CARCINOMA  . VAGINAL HYSTERECTOMY  07/06/2009   Secondary to  dysplasia     Current Outpatient Medications  Medication Sig Dispense Refill  . albuterol (PROVENTIL HFA;VENTOLIN HFA) 108 (90 BASE) MCG/ACT inhaler Inhale 2 puffs into the lungs every 6 (six) hours as needed for wheezing.    Marland Kitchen amLODipine-atorvastatin (CADUET) 5-20 MG tablet Take 1 tablet by mouth daily.     . ARIPiprazole (ABILIFY) 20 MG tablet Take 10 mg by mouth every morning.     Marland Kitchen aspirin EC 81 MG tablet Take 81 mg by mouth every morning.     . Calcium Carbonate-Vitamin D 600-400 MG-UNIT per tablet Take 1 tablet by mouth 2 (two) times daily.     . Cholecalciferol (VITAMIN D3) 2000 UNITS TABS Take 2,000 Units by mouth 2 (two) times daily.     . cloNIDine (CATAPRES) 0.1 MG tablet Take 1 tablet (0.1 mg total) by mouth 2 (two) times daily. 60 tablet 0  . dexlansoprazole (DEXILANT) 60 MG capsule Take 60 mg by mouth daily.    Marland Kitchen docusate sodium (COLACE) 100 MG capsule Take 100 mg by mouth as directed.    . furosemide (LASIX) 20 MG tablet Take 20 mg by mouth every morning.    . lamoTRIgine (LAMICTAL) 100 MG tablet Take 200 mg by mouth 2 (two) times daily.     . mometasone-formoterol (DULERA) 100-5 MCG/ACT AERO Inhale 2 puffs into the lungs 2 (two) times daily. 1 Inhaler 3  . Multiple Vitamin (MULTIVITAMIN) capsule Take 1 capsule by mouth every morning.     . nitroGLYCERIN (NITROSTAT) 0.4 MG SL tablet Place 1 tablet (0.4 mg total) under the tongue every 5 (five) minutes as needed for chest pain. 25 tablet 3  . oxyCODONE-acetaminophen (PERCOCET) 10-325 MG per tablet Take 1 tablet by mouth every 4 (four) hours.     . polyethylene glycol (MIRALAX / GLYCOLAX) packet Take 17 g by mouth daily as needed for mild constipation or moderate constipation.     . potassium chloride (KLOR-CON) 8 MEQ tablet Take 8 mEq by mouth every morning.     . sertraline (ZOLOFT) 50 MG tablet Take 50 mg by mouth every morning.     . tiotropium (SPIRIVA) 18 MCG inhalation capsule Place 18 mcg into inhaler and inhale every  evening.     Marland Kitchen tiZANidine (ZANAFLEX) 4 MG tablet tizanidine 4 mg tablet  TAKE 1 TABLET BY MOUTH 4TIMES A DAY AS NEEDED FOR SPASMS    . valsartan (DIOVAN) 320 MG tablet Take 1 tablet (320 mg total) by mouth daily. 30 tablet 6   No current facility-administered medications for this visit.    Allergies:   Patient has no known allergies.    Social History:  The patient  reports that she quit smoking about 8 years ago.  Her smoking use included cigarettes. She has a 100.00 pack-year smoking history. She has never used smokeless tobacco. She reports previous alcohol use. She reports that she does not use drugs.   Family History:  The patient's family history includes Hypertension in her father and mother.    ROS:  Please see the history of present illness.   Otherwise, review of systems are positive for none.   All other systems are reviewed and negative.    PHYSICAL EXAM: VS:  BP (!) 146/70 (BP Location: Right Arm, Cuff Size: Normal)   Pulse 89   Ht 5\' 3"  (1.6 m)   Wt 171 lb (77.6 kg)   SpO2 91%   BMI 30.29 kg/m  , BMI Body mass index is 30.29 kg/m. GEN: Well nourished, well developed, in no acute distress HEENT: sclera anicteric  Neck: no JVD, carotid bruits, or masses Cardiac: RRR; no murmurs, rubs, or gallops, no edema  Respiratory:  clear GI: soft, nontender, nondistended, + BS MS: no deformity or atrophy Skin: warm and dry, no rash Neuro:  Strength and sensation are intact Psych: euthymic mood, full affect   EKG:  EKG is  Not ordered today.   Recent Labs: 03/06/2019: BNP 78.3; BUN 25; Creatinine, Ser 0.90; Potassium 4.6; Sodium 139   Dated 02/22/19: A1c 5.8%. cholesterol 176, triglycerides 52, HDL 93, LDL 62. CMET, CBC, TSH normal   Lipid Panel    Component Value Date/Time   CHOL  12/25/2008 0550    161        ATP III CLASSIFICATION:  <200     mg/dL   Desirable  200-239  mg/dL   Borderline High  >=240    mg/dL   High          TRIG 39 12/25/2008 0550   HDL 102  12/25/2008 0550   CHOLHDL 1.6 12/25/2008 0550   VLDL 8 12/25/2008 0550   LDLCALC  12/25/2008 0550    51        Total Cholesterol/HDL:CHD Risk Coronary Heart Disease Risk Table                     Men   Women  1/2 Average Risk   3.4   3.3  Average Risk       5.0   4.4  2 X Average Risk   9.6   7.1  3 X Average Risk  23.4   11.0        Use the calculated Patient Ratio above and the CHD Risk Table to determine the patient's CHD Risk.        ATP III CLASSIFICATION (LDL):  <100     mg/dL   Optimal  100-129  mg/dL   Near or Above                    Optimal  130-159  mg/dL   Borderline  160-189  mg/dL   High  >190     mg/dL   Very High      Wt Readings from Last 3 Encounters:  06/06/19 171 lb (77.6 kg)  05/16/19 170 lb (77.1 kg)  05/02/19 167 lb 9.6 oz (76 kg)      Other studies Reviewed: Additional studies/ records that were reviewed today include:   NST 02/2017: IMPRESSION: 1. Fixed defect in the inferolateral wall, likely old infarct. There appears to be some mild peri-infarct ischemia in the adjacent lateral wall.  2. Decreased wall motion and thickening  in the inferolateral wall.  3. Left ventricular ejection fraction 51%  4. Non invasive risk stratification*: High   Echocardiogram 02/2017: - Left ventricle: The cavity size was normal. There was mild  concentric hypertrophy. Systolic function was mildly reduced. The  estimated ejection fraction was in the range of 45% to 50%.  Hypokinesis and scarring of the basal-midinferolateral and  inferior myocardium; consistent with infarction in the  distribution of the right coronary or left circumflex coronary  artery. Doppler parameters are consistent with abnormal left  ventricular relaxation (grade 1 diastolic dysfunction).  - Mitral valve: Calcified annulus.   Echo 03/20/19: IMPRESSIONS    1. Left ventricular ejection fraction, by estimation, is 60 to 65%. The  left ventricle has normal  function. The left ventricle has no regional  wall motion abnormalities. Left ventricular diastolic parameters are  consistent with Grade I diastolic  dysfunction (impaired relaxation). The average left ventricular global  longitudinal strain is -19.4 %.  2. Right ventricular systolic function is normal. The right ventricular  size is normal. There is mildly elevated pulmonary artery systolic  pressure.  3. Left atrial size was severely dilated.  4. The mitral valve is normal in structure and function. Mild mitral  valve regurgitation. No evidence of mitral stenosis.  5. The aortic valve is normal in structure and function. Aortic valve  regurgitation is not visualized. No aortic stenosis is present.  6. The inferior vena cava is normal in size with greater than 50%  respiratory variability, suggesting right atrial pressure of 3 mmHg.   LE arterial  dopplers 05/20/19: Summary:  Right: Resting right ankle-brachial index indicates moderate right lower  extremity arterial disease. The right toe-brachial index is abnormal.   Left: Resting left ankle-brachial index indicates moderate left lower  extremity arterial disease. The left toe-brachial index is abnormal.   ASSESSMENT AND PLAN:  1. Acute on chronic combined CHF: EF 45-50% in 2019. Recent Echo in March showed normal LV function with EF 55-60%. Not on BBlocker due to baseline bradycardia.  - Continue lasix 20mg  daily with plans to take additional 20mg  for weight gain of 3lbs in 1 day or 5lbs in 1 week   2. HTN: BP improved today - will continue current regimen.  3. CAD s/p MI with PCI to RCA in 2000:  - she is asymptomatic.  - Continue aspirin and statin  4. Carotid artery stenosis: s/p R CEA 1995.  - plan repeat dopplers in June. - Continue aspirin and statin  5. PAD: moderate bilateral fem-pop disease noted on last ABI 02/2018.  - no change by recent dopplers.  - Continue aspirin and statin  6. HLD: - Continue  caduet (atorvastatin 20mg  daily). Lipids are at goal.   Current medicines are reviewed at length with the patient today.  The patient does not have concerns regarding medicines.  The following changes have been made:  As above  Labs/ tests ordered today include:   No orders of the defined types were placed in this encounter.    Disposition:   FU 6 months  Signed, Jade Pasko Martinique, MD  06/06/2019 4:10 PM

## 2019-06-05 DIAGNOSIS — S82102A Unspecified fracture of upper end of left tibia, initial encounter for closed fracture: Secondary | ICD-10-CM | POA: Diagnosis not present

## 2019-06-05 DIAGNOSIS — M25562 Pain in left knee: Secondary | ICD-10-CM | POA: Diagnosis not present

## 2019-06-06 ENCOUNTER — Encounter: Payer: Self-pay | Admitting: Cardiology

## 2019-06-06 ENCOUNTER — Ambulatory Visit (INDEPENDENT_AMBULATORY_CARE_PROVIDER_SITE_OTHER): Payer: Medicare Other | Admitting: Cardiology

## 2019-06-06 ENCOUNTER — Other Ambulatory Visit: Payer: Self-pay

## 2019-06-06 VITALS — BP 146/70 | HR 89 | Ht 63.0 in | Wt 171.0 lb

## 2019-06-06 DIAGNOSIS — I5042 Chronic combined systolic (congestive) and diastolic (congestive) heart failure: Secondary | ICD-10-CM | POA: Diagnosis not present

## 2019-06-06 DIAGNOSIS — I739 Peripheral vascular disease, unspecified: Secondary | ICD-10-CM | POA: Diagnosis not present

## 2019-06-06 DIAGNOSIS — I2510 Atherosclerotic heart disease of native coronary artery without angina pectoris: Secondary | ICD-10-CM | POA: Diagnosis not present

## 2019-06-06 DIAGNOSIS — I10 Essential (primary) hypertension: Secondary | ICD-10-CM | POA: Diagnosis not present

## 2019-06-06 DIAGNOSIS — I6523 Occlusion and stenosis of bilateral carotid arteries: Secondary | ICD-10-CM

## 2019-06-24 ENCOUNTER — Other Ambulatory Visit: Payer: Self-pay

## 2019-06-24 ENCOUNTER — Ambulatory Visit (HOSPITAL_COMMUNITY)
Admission: RE | Admit: 2019-06-24 | Discharge: 2019-06-24 | Disposition: A | Discharge status: Home / Self Care | Payer: Medicare Other | Source: Ambulatory Visit | Attending: Cardiovascular Disease | Admitting: Cardiovascular Disease

## 2019-06-24 DIAGNOSIS — I6523 Occlusion and stenosis of bilateral carotid arteries: Secondary | ICD-10-CM | POA: Diagnosis not present

## 2019-06-26 ENCOUNTER — Telehealth: Payer: Self-pay | Admitting: Cardiology

## 2019-06-26 DIAGNOSIS — R9439 Abnormal result of other cardiovascular function study: Secondary | ICD-10-CM

## 2019-06-26 NOTE — Telephone Encounter (Signed)
Spoke with patient and she stated Clonidine was discontinued  Med list updated Will forward to Dr Martinique for review

## 2019-06-26 NOTE — Telephone Encounter (Signed)
Pt c/o BP issue: STAT if pt c/o blurred vision, one-sided weakness or slurred speech  1. What are your last 5 BP readings?  06/25/19 174/81 (PM)  2. Are you having any other symptoms (ex. Dizziness, headache, blurred vision, passed out)? No just feels uptight   3. What is your BP issue? [place] is calling requesting to dicuss her recent hypertension as well as her doppler results especially the right sight that was performed on 06/24/19 with Dr. Martinique. She states her BP was elevated when it was performed and the nurse was concerned about this as well as her results. She states she does not have any other BP readings besides yesterdays, but her BP has been elevated ever since. The nurse who took her BP also advised she would give Dr. Martinique her BP readings collected on the day of her procedure. Please advise.

## 2019-06-26 NOTE — Telephone Encounter (Signed)
OK then just take 0.1 mg of clonidine at night.  Yunior Jain Martinique MD, [facility]

## 2019-06-26 NOTE — Telephone Encounter (Signed)
-----   Message from Peter M Martinique, MD sent at 06/24/2019  2:48 PM EDT ----- This study demonstrates:  carotid dopplers show significant increase in the distal left common carotid.  Medication changes / Follow up studies / Other recommendations:   Recommend vascular surgery consult.  Please send results to the PCP:  Harlan Stains, MD  Peter Martinique, MD 06/24/2019 2:48 PM

## 2019-06-26 NOTE — Telephone Encounter (Signed)
I would have her increase clonidine to 0.2 mg in the evening and 0.1 mg in the am  Collier [place]

## 2019-06-26 NOTE — Telephone Encounter (Signed)
Patient is concerned about her blood pressures which are running 140's-180's/80's , current reading 145/80 HR 58 Blood pressure medications reviewed  Will forward to Dr Martinique for review   Spoke with patient and reviewed dopplers, referral placed. She will call them directly 06/28/2019

## 2019-06-27 NOTE — Telephone Encounter (Signed)
Left message to call back  

## 2019-06-28 ENCOUNTER — Telehealth: Payer: Self-pay

## 2019-06-28 ENCOUNTER — Other Ambulatory Visit: Payer: Self-pay

## 2019-06-28 ENCOUNTER — Other Ambulatory Visit (HOSPITAL_COMMUNITY)
Admission: RE | Admit: 2019-06-28 | Discharge: 2019-06-28 | Disposition: A | Discharge status: Home / Self Care | Payer: Medicare Other | Source: Ambulatory Visit | Attending: Critical Care Medicine | Admitting: Critical Care Medicine

## 2019-06-28 DIAGNOSIS — Z20822 Contact with and (suspected) exposure to covid-19: Secondary | ICD-10-CM | POA: Insufficient documentation

## 2019-06-28 DIAGNOSIS — Z01812 Encounter for preprocedural laboratory examination: Secondary | ICD-10-CM | POA: Insufficient documentation

## 2019-06-28 LAB — SARS CORONAVIRUS 2 (TAT 6-24 HRS): SARS Coronavirus 2: NEGATIVE

## 2019-06-28 MED ORDER — HYDRALAZINE HCL 25 MG PO TABS
25.0000 mg | ORAL_TABLET | Freq: Two times a day (BID) | ORAL | 3 refills | Status: DC
Start: 2019-06-28 — End: 2020-08-17

## 2019-06-28 MED ORDER — CLONIDINE HCL 0.1 MG PO TABS: 0.1000 mg | ORAL_TABLET | Freq: Every day | ORAL | 3 refills | Status: DC

## 2019-06-28 NOTE — Telephone Encounter (Signed)
Pt agreed to Dr. Doug Sou recommendations.

## 2019-06-28 NOTE — Progress Notes (Signed)
RX for pts Clonidine 0.1 mg qhs called in to to [place]... unable to locate electronically: [phone].

## 2019-06-28 NOTE — Telephone Encounter (Signed)
Advised patient, verbalized understanding  

## 2019-06-28 NOTE — Telephone Encounter (Signed)
OK so Clonidine is not an option. Lets try hydralazine 25 mg bid.  Markise Haymer Martinique MD, [facility]

## 2019-06-28 NOTE — Telephone Encounter (Signed)
Patient is returning Melinda's call. Please call back.  

## 2019-06-28 NOTE — Telephone Encounter (Signed)
[facility] pharmacy requesting clarification for pt taking clonidine 0.1 mg tablet. Pharmacy is stating that that is an interaction with Tizanidine. Pt takes Tizanidine 4 mg tablet, 4 times a day and there could be a increase of Hypotension. Please advise on this matter. Ph#[phone].

## 2019-07-02 ENCOUNTER — Other Ambulatory Visit: Payer: Self-pay

## 2019-07-02 ENCOUNTER — Encounter: Payer: Self-pay | Admitting: Adult Health

## 2019-07-02 ENCOUNTER — Ambulatory Visit: Payer: Medicare Other | Admitting: Primary Care

## 2019-07-02 ENCOUNTER — Ambulatory Visit (INDEPENDENT_AMBULATORY_CARE_PROVIDER_SITE_OTHER): Payer: Medicare Other | Admitting: Critical Care Medicine

## 2019-07-02 ENCOUNTER — Ambulatory Visit (INDEPENDENT_AMBULATORY_CARE_PROVIDER_SITE_OTHER): Payer: Medicare Other | Admitting: Adult Health

## 2019-07-02 DIAGNOSIS — I5032 Chronic diastolic (congestive) heart failure: Secondary | ICD-10-CM | POA: Diagnosis not present

## 2019-07-02 DIAGNOSIS — M4120 Other idiopathic scoliosis, site unspecified: Secondary | ICD-10-CM

## 2019-07-02 DIAGNOSIS — J439 Emphysema, unspecified: Secondary | ICD-10-CM

## 2019-07-02 DIAGNOSIS — K219 Gastro-esophageal reflux disease without esophagitis: Secondary | ICD-10-CM | POA: Diagnosis not present

## 2019-07-02 DIAGNOSIS — J431 Panlobular emphysema: Secondary | ICD-10-CM

## 2019-07-02 DIAGNOSIS — I6523 Occlusion and stenosis of bilateral carotid arteries: Secondary | ICD-10-CM | POA: Diagnosis not present

## 2019-07-02 DIAGNOSIS — Z87891 Personal history of nicotine dependence: Secondary | ICD-10-CM

## 2019-07-02 LAB — PULMONARY FUNCTION TEST
DL/VA % pred: 55 %
DL/VA: 2.28 ml/min/mmHg/L
DLCO cor % pred: 53 %
DLCO cor: 9.87 ml/min/mmHg
DLCO unc % pred: 53 %
DLCO unc: 9.87 ml/min/mmHg
FEF 25-75 Post: 1.09 L/sec
FEF 25-75 Pre: 1.09 L/sec
FEF2575-%Change-Post: 0 %
FEF2575-%Pred-Post: 65 %
FEF2575-%Pred-Pre: 66 %
FEV1-%Change-Post: 0 %
FEV1-%Pred-Post: 91 %
FEV1-%Pred-Pre: 91 %
FEV1-Post: 1.87 L
FEV1-Pre: 1.86 L
FEV1FVC-%Change-Post: 1 %
FEV1FVC-%Pred-Pre: 90 %
FEV6-%Change-Post: -1 %
FEV6-%Pred-Post: 103 %
FEV6-%Pred-Pre: 105 %
FEV6-Post: 2.7 L
FEV6-Pre: 2.73 L
FEV6FVC-%Change-Post: 0 %
FEV6FVC-%Pred-Post: 104 %
FEV6FVC-%Pred-Pre: 104 %
FVC-%Change-Post: 0 %
FVC-%Pred-Post: 99 %
FVC-%Pred-Pre: 100 %
FVC-Post: 2.71 L
FVC-Pre: 2.74 L
Post FEV1/FVC ratio: 69 %
Post FEV6/FVC ratio: 100 %
Pre FEV1/FVC ratio: 68 %
Pre FEV6/FVC Ratio: 100 %
RV % pred: 84 %
RV: 1.87 L
TLC % pred: 92 %
TLC: 4.52 L

## 2019-07-02 NOTE — Progress Notes (Signed)
PFT done today. 

## 2019-07-02 NOTE — Patient Instructions (Addendum)
Continue on Trelegy 1 puff daily, rinse after use Continue activity and exercise as tolerated Follow-up for low-dose CT screening this fall as planned Continue on GERD diet Continue on Dexilant daily before meal Follow-up with Dr. Carlis Abbott in 4 months and as needed

## 2019-07-02 NOTE — Assessment & Plan Note (Signed)
Appears euvolemic on exam with no evidence of volume overload.  Patient is to continue on her current regimen.

## 2019-07-02 NOTE — Progress Notes (Signed)
@Patient  ID: [name], female    DOB: Jan 02, 1946, 74 y.o.   MRN: [id number]  Chief Complaint  Patient presents with  . Follow-up    Emphysema     Referring provider: Harlan Stains, MD  HPI: 74 year old female former smoker seen for pulmonary consult May 02, 2019 for emphysema and shortness of breath Medical history significant for chronic kypho-scoliosis  TEST/EVENTS :  CXR, 2 view 02/25/2019-kyphoscoliosis.  Flattened hemidiaphragms bilaterally.  Increased right upper lobe lucency suspicious for bullous disease.  10/01/2018 LD CT chest-bilateral upper lobe panlobular emphysema, worse on the right.  Bilateral basilar cystic changes.  Mild focal right lower lobe subpleural fibrosis no masses.  Patulous esophagus.  Significant vascular calcification.  No obvious mediastinal or hilar adenopathy.  Lung RADS 1  Echocardiogram 03/20/2019: LVEF 60 to 62%, grade 1 diastolic dysfunction.  Severely dilated LA, normal RV.  Mildly elevated PASP.  Normal RA.  Mild MR and TR.  07/02/2019 Follow up: COPD /Emphysema  Patient presents for a 51-month follow-up.  Patient was seen last visit for pulmonary consult for COPD and shortness of breath.  Patient had been diagnosed with COPD in the past and was on [place].  Patient was set up for pulmonary function testing which was done today showed no mild airflow obstruction with an FEV1 at 91%, ratio 69, FVC 99%, no significant bronchodilator response, moderate mid flow obstruction, DLCO 53%.  We reviewed her test results. As above CT chest September 2020 showed bilateral emphysema worse on the right and bilateral basilar cystic changes.  With mild focal right lower lobe subpleural fibrosis. Patient was also suspected to have underlying reflux and was recommended on a GERD diet along with Dexilant daily.  Since last visit patient says she is feeling better.  She was changed to Trelegy by her primary care provider feels that it is working better  than her other inhalers she has less albuterol use.  And feels that her breathing has gotten better.  She also is starting to walk more frequently and feels that her activity tolerance has improved since last visit.  Her Covid vaccine is up-to-date.   No Known Allergies  Immunization History  Administered Date(s) Administered  . Fluad Quad(high Dose 65+) 11/01/2018  . Influenza Split 01/18/2008, 11/10/2009  . Influenza, High Dose Seasonal PF 09/30/2011  . Influenza,inj,quad, With Preservative 10/17/2016  . Influenza-Unspecified 12/07/2010, 09/17/2013  . Moderna SARS-COVID-2 Vaccination 03/01/2019, 03/29/2019  . Pneumococcal-Unspecified 01/18/2008, 04/12/2010  . Tdap 04/25/2011  . Zoster 04/28/2010  . Zoster Recombinat (Shingrix) 08/06/2017    Past Medical History:  Diagnosis Date  . Alcoholism ([place])    recovering since 2000  . Anxiety and depression   . Arthritis BACK  . Bipolar disorder ([place])   . Blepharospasm LEFT EYE  . CHF exacerbation ([place]) 02/24/2017  . Chronic back pain   . COPD (chronic obstructive pulmonary disease) ([place])   . Coronary artery disease CARDIOLOGIST- DR [place]--- LAST VISIT NOTE 09-07-2009  W/ CHART  . Emphysema   . History of alcohol abuse RECOVERING SINCE 2000  . History of Left renal mass 03/02/2012   underwent partial nephrectomy  . HTN (hypertension)   . Idiopathic acute facial nerve palsy LEFT SIDE--  BOTOX THERAPY  . Inferior MI ([place]) 2000--  POST PTCA W/ STENT X1  . Osteoporosis   . Other and unspecified general anesthetics causing adverse effect in therapeutic use post op delirium--  last anes record w/ chart  from   09-22-2009 (  spinal w/ light sedation)  . Peripheral vascular disease ([place]) POST RIGHT CAROTID SURG.  1995  . Renal cell carcinoma ([place]) 07/09/12   Left mass  . Rosacea LEFT FACIAL RASH  . S/P radiation therapy 02/21/2012   38.75 Gy HDR 5 Fractions- vaginal cuff  . Scoliosis   . Seizures ([place])    x 1 after abrupt discontinuation of   Clonidine  . Status post carotid endarterectomy RIGHT --  1995  . Status post primary angioplasty with coronary stent 2000--  POST INFERIOR MI  . Unstable balance WALKS W/ CANE  . Vaginal cancer ([place])     Tobacco History: Social History   Tobacco Use  Smoking Status Former Smoker  . Packs/day: 2.00  . Years: 50.00  . Pack years: 100.00  . Types: Cigarettes  . Quit date: 01/18/2011  . Years since quitting: 8.4  Smokeless Tobacco Never Used  Tobacco Comment   STATES QUIT SMOKING 01-18-2011   Counseling given: Not Answered Comment: STATES QUIT SMOKING 01-18-2011   Outpatient Medications Prior to Visit  Medication Sig Dispense Refill  . albuterol (PROVENTIL HFA;VENTOLIN HFA) 108 (90 BASE) MCG/ACT inhaler Inhale 2 puffs into the lungs every 6 (six) hours as needed for wheezing.    Marland Kitchen amLODipine-atorvastatin (CADUET) 5-20 MG tablet Take 1 tablet by mouth daily.     . ARIPiprazole (ABILIFY) 20 MG tablet Take 10 mg by mouth every morning.     Marland Kitchen aspirin EC 81 MG tablet Take 81 mg by mouth every morning.     . Calcium Carbonate-Vitamin D 600-400 MG-UNIT per tablet Take 1 tablet by mouth 2 (two) times daily.     . Cholecalciferol (VITAMIN D3) 2000 UNITS TABS Take 2,000 Units by mouth 2 (two) times daily.     Marland Kitchen dexlansoprazole (DEXILANT) 60 MG capsule Take 60 mg by mouth daily.    Marland Kitchen docusate sodium (COLACE) 100 MG capsule Take 100 mg by mouth as directed.    . Fluticasone-Umeclidin-Vilant (TRELEGY ELLIPTA IN) Inhale 2 puffs into the lungs daily.    . furosemide (LASIX) 20 MG tablet Take 20 mg by mouth as directed. Take daily every 2-3 days    . hydrALAZINE (APRESOLINE) 25 MG tablet Take 1 tablet (25 mg total) by mouth 2 (two) times daily. 270 tablet 3  . lamoTRIgine (LAMICTAL) 100 MG tablet Take 200 mg by mouth 2 (two) times daily.     . Multiple Vitamin (MULTIVITAMIN) capsule Take 1 capsule by mouth every morning.     . nitroGLYCERIN (NITROSTAT) 0.4 MG SL tablet Place 1 tablet (0.4 mg  total) under the tongue every 5 (five) minutes as needed for chest pain. 25 tablet 3  . oxyCODONE-acetaminophen (PERCOCET) 10-325 MG per tablet Take 1 tablet by mouth every 4 (four) hours.     . polyethylene glycol (MIRALAX / GLYCOLAX) packet Take 17 g by mouth daily as needed for mild constipation or moderate constipation.     . potassium chloride (KLOR-CON) 8 MEQ tablet Take 8 mEq by mouth every morning.     . sertraline (ZOLOFT) 50 MG tablet Take 50 mg by mouth every morning.     Marland Kitchen tiZANidine (ZANAFLEX) 4 MG tablet tizanidine 4 mg tablet  TAKE 1 TABLET BY MOUTH 4TIMES A DAY AS NEEDED FOR SPASMS    . valsartan (DIOVAN) 320 MG tablet Take 1 tablet (320 mg total) by mouth daily. 30 tablet 6  . mometasone-formoterol (DULERA) 100-5 MCG/ACT AERO Inhale 2 puffs into the lungs 2 (two) times  daily. 1 Inhaler 3  . tiotropium (SPIRIVA) 18 MCG inhalation capsule Place 18 mcg into inhaler and inhale every evening.      No facility-administered medications prior to visit.     Review of Systems:   Constitutional:   No  weight loss, night sweats,  Fevers, chills,  +fatigue, or  lassitude.  HEENT:   No headaches,  Difficulty swallowing,  Tooth/dental problems, or  Sore throat,                No sneezing, itching, ear ache, nasal congestion, post nasal drip,   CV:  No chest pain,  Orthopnea, PND, +swelling in lower extremities, no anasarca, dizziness, palpitations, syncope.   GI  No heartburn, indigestion, abdominal pain, nausea, vomiting, diarrhea, change in bowel habits, loss of appetite, bloody stools.   Resp:    No excess mucus, no productive cough,  No non-productive cough,  No coughing up of blood.  No change in color of mucus.  No wheezing.  No chest wall deformity  Skin: no rash or lesions.  GU: no dysuria, change in color of urine, no urgency or frequency.  No flank pain, no hematuria   MS:  No joint pain or swelling.  No decreased range of motion.  No back pain.    Physical Exam  BP  132/88 (BP Location: Left Arm, Cuff Size: Normal)   Pulse 63   Temp (!) 97.1 F (36.2 C) (Oral)   Ht 5\' 3"  (1.6 m)   Wt 177 lb 9.6 oz (80.6 kg)   SpO2 90%   BMI 31.46 kg/m   GEN: A/Ox3; pleasant , NAD, elderly, walker   HEENT:  [place]/AT,  NOSE-clear, THROAT-clear, no lesions, no postnasal drip or exudate noted.   NECK:  Supple w/ fair ROM; no JVD; normal carotid impulses w/o bruits; no thyromegaly or nodules palpated; no lymphadenopathy.    RESP  Clear  P & A; w/o, wheezes/ rales/ or rhonchi. no accessory muscle use, no dullness to percussion  CARD:  RRR, no m/r/g, trace peripheral edema, pulses intact, no cyanosis or clubbing.  GI:   Soft & nt; nml bowel sounds; no organomegaly or masses detected.   Musco: Warm bil, no deformities or joint swelling noted.   Neuro: alert, no focal deficits noted.    Skin: Warm, no lesions or rashes    Lab Results:      PFT Results Latest Ref Rng & Units 07/02/2019  FVC-Pre L 2.74  FVC-Predicted Pre % 100  FVC-Post L 2.71  FVC-Predicted Post % 99  Pre FEV1/FVC % % 68  Post FEV1/FCV % % 69  FEV1-Pre L 1.86  FEV1-Predicted Pre % 91  FEV1-Post L 1.87  DLCO UNC% % 53  DLCO COR %Predicted % 55  TLC L 4.52  TLC % Predicted % 92  RV % Predicted % 84    No results found for: NITRICOXIDE      Assessment & Plan:   COPD (chronic obstructive pulmonary disease) (HCC) Mild COPD with emphysema Improved symptom burden on Trelegy. Continue current regimen.  Activity as tolerated.  Patient does have a component of physical deconditioning , along with kyphoscoliosis and diastolic heart failure are probably contributing to her shortness of breath  Plan  Patient Instructions  Continue on Trelegy 1 puff daily, rinse after use Continue activity and exercise as tolerated Follow-up for low-dose CT screening this fall as planned Continue on GERD diet Continue on Dexilant daily before meal Follow-up with Dr. Carlis Abbott in  4 months and as needed       Chronic diastolic CHF (congestive heart failure) ([place]) Appears euvolemic on exam with no evidence of volume overload.  Patient is to continue on her current regimen.  GERD (gastroesophageal reflux disease) GERD continue on GERD diet and Dexilant     [name], NP 07/02/2019

## 2019-07-02 NOTE — Assessment & Plan Note (Signed)
GERD continue on GERD diet and Dexilant

## 2019-07-02 NOTE — Assessment & Plan Note (Signed)
Mild COPD with emphysema Improved symptom burden on Trelegy. Continue current regimen.  Activity as tolerated.  Patient does have a component of physical deconditioning , along with kyphoscoliosis and diastolic heart failure are probably contributing to her shortness of breath  Plan  Patient Instructions  Continue on Trelegy 1 puff daily, rinse after use Continue activity and exercise as tolerated Follow-up for low-dose CT screening this fall as planned Continue on GERD diet Continue on Dexilant daily before meal Follow-up with Dr. Carlis Abbott in 4 months and as needed

## 2019-07-04 NOTE — Progress Notes (Signed)
Agree, thanks!  Julian Hy, DO 07/04/19 8:08 PM [facility]

## 2019-07-16 ENCOUNTER — Telehealth: Payer: Self-pay | Admitting: *Deleted

## 2019-07-16 NOTE — Telephone Encounter (Signed)
Ms. Stierwalt called to schedule a follow up appointment. Pt scheduled to see Dr. Delsa Sale and she is aware of the date and time of appointment.

## 2019-07-23 ENCOUNTER — Telehealth: Payer: Self-pay | Admitting: Cardiology

## 2019-07-23 ENCOUNTER — Telehealth: Payer: Self-pay | Admitting: Critical Care Medicine

## 2019-07-23 NOTE — Telephone Encounter (Signed)
Noted, appt set for Thursday at 3 pm

## 2019-07-23 NOTE — Telephone Encounter (Signed)
Pt c/o BP issue: STAT if pt c/o blurred vision, one-sided weakness or slurred speech  1. What are your last 5 BP readings?  07/18/19: 168/77 HR 62 O2 95 07/19/19: 155/73 HR 73 O2 92 07/20/19: 174/75 HR 71 O2 94 07/21/19: 178/80 HR 75 O2 92 07/22/19: 175/80 HR 64 O2 92 07/23/19: 169/70 HR 72 O2 92  2. Are you having any other symptoms (ex. Dizziness, headache, blurred vision, passed out)? Headache, pt feels exhausted   3. What is your BP issue? Pt just feels like her medication is not working because her BP readings are high  Pt c/o Shortness Of Breath: STAT if SOB developed within the last 24 hours or pt is noticeably SOB on the phone  1. Are you currently SOB (can you hear that pt is SOB on the phone)? No   2. How long have you been experiencing SOB?  Awhile. PT has COPD and Emphysema but the pt still feels short winded   3. Are you SOB when sitting or when up moving around? With movement   4. Are you currently experiencing any other symptoms? Headache, BP issues

## 2019-07-23 NOTE — Telephone Encounter (Signed)
Called patient- advised that she was just recently switched to hydralazine in June- but she continues to have issues with her BP-  I advised that Dr.Jordan did not have any openings, but she could see PharmD and they could get her medications working, and check her BP here in office for an appointment.  Patient agreeable.  I will notify PharmD of this.  Patient has had no other changes, she just complaints of headache- and being tired.   07/08 CV[facility] appt made.

## 2019-07-23 NOTE — Assessment & Plan Note (Addendum)
Clinical stage I vaginal carcinoma treated with high-dose rate vaginal brachyherapy. Negative symptom review, normal exam  >recommend annual f/u; f/u w/GYN ONC or generalist appropriate

## 2019-07-23 NOTE — Telephone Encounter (Signed)
Paitent advised this is a good O2 reading and asked if she had any other Symptoms at this time she dines any at this time. She will call with any other issues.

## 2019-07-23 NOTE — Progress Notes (Signed)
Follow Up Note: Gyn-Onc  [name] 74 y.o. female  CC: She presents for a f/u visit.  HPI:  Oncology History  Vaginal cancer ([place])  12/28/2011 Initial Diagnosis   Vaginal cancer, Stage I    Radiation Therapy   HDR x 5 with Dr. Lanell Persons     Interval History: She denies any vaginal bleeding, abdominal/pelvic pain, leg pain, urinary symptoms, cough or weight loss,  A Pap smear in 07/2018 showed NILM.  Review of Systems  Review of Systems  Constitutional: Negative for weight loss.  Respiratory: Negative for hemoptysis.   Cardiovascular: Positive for leg swelling.  Gastrointestinal: Negative for abdominal pain.  Genitourinary: Negative for dysuria, frequency and urgency.  Psychiatric/Behavioral: Negative.     Current Meds:  Outpatient Encounter Medications as of 07/24/2019  Medication Sig  . albuterol (PROVENTIL HFA;VENTOLIN HFA) 108 (90 BASE) MCG/ACT inhaler Inhale 2 puffs into the lungs every 6 (six) hours as needed for wheezing.  Marland Kitchen amLODipine-atorvastatin (CADUET) 5-20 MG tablet Take 1 tablet by mouth daily.   . ARIPiprazole (ABILIFY) 20 MG tablet Take 10 mg by mouth every morning.   Marland Kitchen aspirin EC 81 MG tablet Take 81 mg by mouth every morning.   . Calcium Carbonate-Vitamin D 600-400 MG-UNIT per tablet Take 1 tablet by mouth 2 (two) times daily.   . Cholecalciferol (VITAMIN D3) 2000 UNITS TABS Take 2,000 Units by mouth 2 (two) times daily.   Marland Kitchen dexlansoprazole (DEXILANT) 60 MG capsule Take 60 mg by mouth daily.  Marland Kitchen docusate sodium (COLACE) 100 MG capsule Take 100 mg by mouth as directed.  . Fluticasone-Umeclidin-Vilant (TRELEGY ELLIPTA IN) Inhale 2 puffs into the lungs daily.  . furosemide (LASIX) 20 MG tablet Take 20 mg by mouth as directed. Take daily every 2-3 days  . hydrALAZINE (APRESOLINE) 25 MG tablet Take 1 tablet (25 mg total) by mouth 2 (two) times daily.  Marland Kitchen lamoTRIgine (LAMICTAL) 100  MG tablet Take 200 mg by mouth 2 (two) times daily.   . Multiple Vitamin (MULTIVITAMIN) capsule Take 1 capsule by mouth every morning.   . nitroGLYCERIN (NITROSTAT) 0.4 MG SL tablet Place 1 tablet (0.4 mg total) under the tongue every 5 (five) minutes as needed for chest pain.  Marland Kitchen oxyCODONE-acetaminophen (PERCOCET) 10-325 MG per tablet Take 1 tablet by mouth every 4 (four) hours.   . polyethylene glycol (MIRALAX / GLYCOLAX) packet Take 17 g by mouth daily as needed for mild constipation or moderate constipation.   . potassium chloride (KLOR-CON) 8 MEQ tablet Take 8 mEq by mouth every morning.   . sertraline (ZOLOFT) 50 MG tablet Take 50 mg by mouth every morning.   Marland Kitchen tiZANidine (ZANAFLEX) 4 MG tablet tizanidine 4 mg tablet  TAKE 1 TABLET BY MOUTH 4TIMES A DAY AS NEEDED FOR SPASMS  . valsartan (DIOVAN) 320 MG tablet Take 1 tablet (320 mg total) by mouth daily.   No facility-administered encounter medications on file as of 07/24/2019.    Allergy: No Known Allergies  Social Hx:   Social History   Socioeconomic History  . Marital status: Divorced    Spouse name: Not on file  . Number of children: Not on file  . Years of education: Not on file  . Highest education level: Not on file  Occupational History  . Not on file  Tobacco Use  . Smoking status: Former Smoker    Packs/day: 2.00    Years: 50.00    Pack years: 100.00    Types: Cigarettes  Quit date: 01/18/2011    Years since quitting: 8.5  . Smokeless tobacco: Never Used  . Tobacco comment: STATES QUIT SMOKING 01-18-2011  Vaping Use  . Vaping Use: Never used  Substance and Sexual Activity  . Alcohol use: Not Currently    Comment: RECOVERING ALCOHOLIC--   QUIT IN 5643  . Drug use: No  . Sexual activity: Never  Other Topics Concern  . Not on file  Social History Narrative  . Not on file   Social Determinants of Health   Financial Resource Strain:   . Difficulty of Paying Living Expenses:   Food Insecurity:   . Worried  About Charity fundraiser in the Last Year:   . Arboriculturist in the Last Year:   Transportation Needs:   . Film/video editor (Medical):   Marland Kitchen Lack of Transportation (Non-Medical):   Physical Activity:   . Days of Exercise per Week:   . Minutes of Exercise per Session:   Stress:   . Feeling of Stress :   Social Connections:   . Frequency of Communication with Friends and Family:   . Frequency of Social Gatherings with Friends and Family:   . Attends Religious Services:   . Active Member of Clubs or Organizations:   . Attends Archivist Meetings:   Marland Kitchen Marital Status:   Intimate Partner Violence:   . Fear of Current or Ex-Partner:   . Emotionally Abused:   Marland Kitchen Physically Abused:   . Sexually Abused:     Past Surgical Hx:  Past Surgical History:  Procedure Laterality Date  . BREAST EXCISIONAL BIOPSY Left 2019   b9 axilla Bx X 3  . CAROTID ENDARTERECTOMY  1995   RIGHT  . CATARACT EXTRACTION W/ INTRAOCULAR LENS  IMPLANT, BILATERAL    . CERVICAL CONIZATION W/BX  09-23-2008  . CORONARY ANGIOPLASTY WITH STENT PLACEMENT  2000-   INFERIOR MI   X1 STENT TO RCA  . EUS N/A 03/05/2012   Procedure: FULL UPPER ENDOSCOPIC ULTRASOUND (EUS) RADIAL and EGD;  Surgeon: Milus Banister, MD;  Location: [facility];  Service: Endoscopy;  Laterality: N/A;  ercp scope first than eus scope  . HEMIARTHROPLASTY HIP  12-26-2008   LEFT FEMORAL NECK FX  . ORIF HIP FRACTURE  02-13-2007   RIGHT FEMORAL NECK FX  . ORIF RIGHT DISTAL RADIUS AND RIGHT PROXIMAL HUMEROUS NECK FX'S  10-10-2005  . RIGHT SHOULDER SURG.  2007  . ROBOTIC ASSITED PARTIAL NEPHRECTOMY Left 07/09/2012   Procedure: ROBOTIC ASSITED PARTIAL NEPHRECTOMY;  Surgeon: Dutch Gray, MD;  Location: [facility];  Service: Urology;  Laterality: Left;  . TOTAL HIP ARTHROPLASTY  04-15-2008   POST FAILED  RIGHT HIP ORIF FEMORAL FX  . TOTAL KNEE ARTHROPLASTY  09-22-2009   RIGHT  . UPPER RIGHT VAGINAL REGION  12/28/11   BIOPSY: SQUAMOUS CELL  CARCINOMA  . VAGINAL HYSTERECTOMY  07/06/2009   Secondary to dysplasia    Past Medical Hx:  Past Medical History:  Diagnosis Date  . Alcoholism ([place])    recovering since 2000  . Anxiety and depression   . Arthritis BACK  . Bipolar disorder ([place])   . Blepharospasm LEFT EYE  . CHF exacerbation ([place]) 02/24/2017  . Chronic back pain   . COPD (chronic obstructive pulmonary disease) ([place])   . Coronary artery disease CARDIOLOGIST- DR [place]--- LAST VISIT NOTE 09-07-2009  W/ CHART  . Emphysema   . History of alcohol abuse RECOVERING SINCE 2000  . History  of Left renal mass 03/02/2012   underwent partial nephrectomy  . HTN (hypertension)   . Idiopathic acute facial nerve palsy LEFT SIDE--  BOTOX THERAPY  . Inferior MI ([place]) 2000--  POST PTCA W/ STENT X1  . Osteoporosis   . Other and unspecified general anesthetics causing adverse effect in therapeutic use post op delirium--  last anes record w/ chart  from   09-22-2009 (spinal w/ light sedation)  . Peripheral vascular disease ([place]) POST RIGHT CAROTID SURG.  1995  . Renal cell carcinoma ([place]) 07/09/12   Left mass  . Rosacea LEFT FACIAL RASH  . S/P radiation therapy 02/21/2012   38.75 Gy HDR 5 Fractions- vaginal cuff  . Scoliosis   . Seizures ([place])    x 1 after abrupt discontinuation of  Clonidine  . Status post carotid endarterectomy RIGHT --  1995  . Status post primary angioplasty with coronary stent 2000--  POST INFERIOR MI  . Unstable balance WALKS W/ CANE  . Vaginal cancer ([place])     Family Hx:  Family History  Problem Relation Age of Onset  . Hypertension Mother   . Hypertension Father     Vitals:  BP 111/69 (BP Location: Left Arm, Patient Position: Sitting)   Pulse (!) 58   Temp 98.8 F (37.1 C) (Tympanic)   Resp 20   Ht 5\' 3"  (1.6 m)   Wt 176 lb (79.8 kg)   SpO2 94%   BMI 31.18 kg/m  Physical Exam:  Physical Exam Exam conducted with a chaperone present.  Abdominal:     Palpations: Abdomen is soft. There is no  mass.     Tenderness: There is no abdominal tenderness.  Genitourinary:    Exam position: Lithotomy position.     Uterus: Absent.      Rectum: Normal.     Comments: Atrophic changes.  Cervix absent.  Short vaginal canal. Musculoskeletal:     Right lower leg: Edema present.     Left lower leg: No edema.  Lymphadenopathy:     Cervical:     Right cervical: No superficial cervical adenopathy.    Left cervical: No superficial cervical adenopathy.     Lower Body: No right inguinal adenopathy. No left inguinal adenopathy.  Skin:    General: Skin is warm and dry.  Neurological:     General: No focal deficit present.     Mental Status: She is alert.  Psychiatric:        Mood and Affect: Mood normal.     Assessment/Plan: Vaginal cancer Clinical stage I vaginal carcinoma treated with high-dose rate vaginal brachyherapy. Negative symptom review, normal exam  >recommend annual f/u; f/u w/GYN ONC or generalist appropriate  I personally spent 25 minutes face-to-face and non-face-to-face in the care of this patient, which includes all pre, intra, and post visit time on the date of service.   Lahoma Crocker, MD 07/23/2019, 11:31 PM

## 2019-07-24 ENCOUNTER — Other Ambulatory Visit (HOSPITAL_COMMUNITY)
Admission: RE | Admit: 2019-07-24 | Discharge: 2019-07-24 | Disposition: A | Discharge status: Home / Self Care | Payer: Medicare Other | Source: Ambulatory Visit | Attending: Obstetrics & Gynecology | Admitting: Obstetrics & Gynecology

## 2019-07-24 ENCOUNTER — Inpatient Hospital Stay: Payer: Medicare Other | Attending: Obstetrics & Gynecology | Admitting: Obstetrics & Gynecology

## 2019-07-24 ENCOUNTER — Other Ambulatory Visit: Payer: Self-pay

## 2019-07-24 ENCOUNTER — Encounter: Payer: Self-pay | Admitting: Obstetrics & Gynecology

## 2019-07-24 VITALS — BP 111/69 | HR 58 | Temp 98.8°F | Resp 20 | Ht 63.0 in | Wt 176.0 lb

## 2019-07-24 DIAGNOSIS — Z79899 Other long term (current) drug therapy: Secondary | ICD-10-CM | POA: Insufficient documentation

## 2019-07-24 DIAGNOSIS — F419 Anxiety disorder, unspecified: Secondary | ICD-10-CM | POA: Insufficient documentation

## 2019-07-24 DIAGNOSIS — I110 Hypertensive heart disease with heart failure: Secondary | ICD-10-CM | POA: Insufficient documentation

## 2019-07-24 DIAGNOSIS — M1990 Unspecified osteoarthritis, unspecified site: Secondary | ICD-10-CM | POA: Diagnosis not present

## 2019-07-24 DIAGNOSIS — I509 Heart failure, unspecified: Secondary | ICD-10-CM | POA: Insufficient documentation

## 2019-07-24 DIAGNOSIS — Z923 Personal history of irradiation: Secondary | ICD-10-CM | POA: Insufficient documentation

## 2019-07-24 DIAGNOSIS — Z7982 Long term (current) use of aspirin: Secondary | ICD-10-CM | POA: Insufficient documentation

## 2019-07-24 DIAGNOSIS — I739 Peripheral vascular disease, unspecified: Secondary | ICD-10-CM | POA: Insufficient documentation

## 2019-07-24 DIAGNOSIS — M419 Scoliosis, unspecified: Secondary | ICD-10-CM | POA: Insufficient documentation

## 2019-07-24 DIAGNOSIS — F319 Bipolar disorder, unspecified: Secondary | ICD-10-CM | POA: Diagnosis not present

## 2019-07-24 DIAGNOSIS — I252 Old myocardial infarction: Secondary | ICD-10-CM | POA: Insufficient documentation

## 2019-07-24 DIAGNOSIS — Z87891 Personal history of nicotine dependence: Secondary | ICD-10-CM | POA: Diagnosis not present

## 2019-07-24 DIAGNOSIS — I2510 Atherosclerotic heart disease of native coronary artery without angina pectoris: Secondary | ICD-10-CM | POA: Insufficient documentation

## 2019-07-24 DIAGNOSIS — C52 Malignant neoplasm of vagina: Secondary | ICD-10-CM | POA: Insufficient documentation

## 2019-07-24 DIAGNOSIS — J449 Chronic obstructive pulmonary disease, unspecified: Secondary | ICD-10-CM | POA: Insufficient documentation

## 2019-07-24 NOTE — Patient Instructions (Signed)
Return in 1 yr 

## 2019-07-25 ENCOUNTER — Ambulatory Visit (INDEPENDENT_AMBULATORY_CARE_PROVIDER_SITE_OTHER): Payer: Medicare Other | Admitting: Pharmacist Clinician (PhC)/ Clinical Pharmacy Specialist

## 2019-07-25 DIAGNOSIS — I10 Essential (primary) hypertension: Secondary | ICD-10-CM | POA: Diagnosis not present

## 2019-07-25 DIAGNOSIS — I6523 Occlusion and stenosis of bilateral carotid arteries: Secondary | ICD-10-CM

## 2019-07-25 DIAGNOSIS — Z90710 Acquired absence of both cervix and uterus: Secondary | ICD-10-CM | POA: Diagnosis not present

## 2019-07-25 MED ORDER — AMLODIPINE-ATORVASTATIN 10-20 MG PO TABS
1.0000 | ORAL_TABLET | Freq: Every day | ORAL | 6 refills | Status: DC
Start: 2019-07-25 — End: 2020-02-05

## 2019-07-25 NOTE — Patient Instructions (Addendum)
Return for a a follow up appointment in 4-6 weeks   Check your blood pressure at home daily and keep record of the readings.  Take your BP meds as follows:  We will send in a prescription for Caduet 10-20.  You can finish off the 5-20 mg tablets then start on this, or you can take the 10-20 mg every other day, alternating with the 5-20.  When the 5-20 are gone you will take Caduet 10-20 mg each day  Bring all of your meds, your BP cuff and your record of home blood pressures to your next appointment.  Exercise as you're able, try to walk approximately 30 minutes per day.  Keep salt intake to a minimum, especially watch canned and prepared boxed foods.  Eat more fresh fruits and vegetables and fewer canned items.  Avoid eating in fast food restaurants.    HOW TO TAKE YOUR BLOOD PRESSURE: . Rest 5 minutes before taking your blood pressure. .  Don't smoke or drink caffeinated beverages for at least 30 minutes before. . Take your blood pressure before (not after) you eat. . Sit comfortably with your back supported and both feet on the floor (don't cross your legs). . Elevate your arm to heart level on a table or a desk. . Use the proper sized cuff. It should fit smoothly and snugly around your bare upper arm. There should be enough room to slip a fingertip under the cuff. The bottom edge of the cuff should be 1 inch above the crease of the elbow. . Ideally, take 3 measurements at one sitting and record the average.

## 2019-07-26 ENCOUNTER — Encounter: Payer: Self-pay | Admitting: Pharmacist Clinician (PhC)/ Clinical Pharmacy Specialist

## 2019-07-26 NOTE — Assessment & Plan Note (Signed)
A/P: 1. Uncontrolled/Controlled HTN  - Pt BP today is 160/94  - Pt home BP readings tend to be about 160/75 on average  - Pt BP is currently above goal of < 130/80 - Pt is currently on amlodipine-atorvastatin 5-20 mg and her SBP could benefit from an increase in amlodipine dose - Pt was advised to finish her current bottle of amlodipine-atorvastatin 5-20 mg and then initiate amlodipine-atorvastatin 10-20 mg once daily   - Have pt continue hydralazine 25 mg BID and valsartan 320 mg daily - Pt was advised to try to eat out less and try to cook more meals at home, along with eating more of the vegetables she likes  - Reassess BP in 4-6 weeks to see if BP has come down after increasing amlodipine dose

## 2019-07-26 NOTE — Progress Notes (Signed)
07/26/2019 [name] 1945-07-21 [id number]   HPI: MC is a 83 YOWF who presents to the clinic today for hypertension f/u. Pt was last seen in office on 06/06/19 by PCP for CHF and had a BP of 146/70. PCP reported pt BP had improved and continued pt's current BP regimen at that time. Pt's most recent office BP value was 111/69 on 07/24/19.   Antihypertensives tried in the past include: Irbesartan (inpatient, discharged), labetalol (inpatient, discharged), clonidine (d/c d/t interaction with tizanidine, pt reported no problems with them)  PMH: HTN, CAD, CHF, COPD, GERD, arthritis, vaginal cancer, depression, bipolar FH: mother (deceased): HTN; father (deceased) emphysema; 6 siblings, 58 still living  SH: Former smoker, no alcohol use   Dietary habits include:  What kind of meats?  o Mainly beef, some chick-fil-a chicken, no fish  Home cooking? o Eating out more lately (most days of the week)  Fruits and vegetables? o Asparagus, spinach, squash, starchy vegetables   Fried foods? o Chick-fil-a, nothing really other than that   Snacks? o Ice cream   Caffeinated beverages? o 2, 16 oz bottles of coke zero per day  Patient also reports not adding a lot of salt to her home cooked meals   Exercise habits include: Pt reports walking around the block and tries to aim for 5 days per week.  ASCVD risk factors include: patient has current ASCVD  Home BP readings: range of 111-178/69-80 with an avg of 160/75  Last office BP reading: 111/69 (07/24/19), 132/88 (07/02/19), 146/70 (06/06/19)  CMP: On 03/06/19, Na 139, K 4.6, BUN 28, SCr 0.9, glucose 92 Renal function: eGFR: 63, SCr: 0.9, CrCl: 67 mL/min (low)   Clinical ASCVD: Yes (inferior MI, CAD)  Wt Readings from Last 3 Encounters:  07/25/19 176 lb 9.6 oz (80.1 kg)  07/24/19 176 lb (79.8 kg)  07/02/19 177 lb 9.6 oz (80.6 kg)   BP Readings from Last 3 Encounters:  07/25/19 (!) 160/94  07/24/19 111/69  07/02/19 132/88   Pulse  Readings from Last 3 Encounters:  07/25/19 76  07/24/19 (!) 58  07/02/19 63    Current Outpatient Medications  Medication Sig Dispense Refill  . albuterol (PROVENTIL HFA;VENTOLIN HFA) 108 (90 BASE) MCG/ACT inhaler Inhale 2 puffs into the lungs every 6 (six) hours as needed for wheezing.    . ARIPiprazole (ABILIFY) 20 MG tablet Take 10 mg by mouth every morning.     Marland Kitchen aspirin EC 81 MG tablet Take 81 mg by mouth every morning.     . Calcium Carbonate-Vitamin D 600-400 MG-UNIT per tablet Take 1 tablet by mouth 2 (two) times daily.     . Cholecalciferol (VITAMIN D3) 2000 UNITS TABS Take 2,000 Units by mouth 2 (two) times daily.     Marland Kitchen dexlansoprazole (DEXILANT) 60 MG capsule Take 60 mg by mouth daily.    Marland Kitchen docusate sodium (COLACE) 100 MG capsule Take 100 mg by mouth as directed.    . Fluticasone-Umeclidin-Vilant (TRELEGY ELLIPTA IN) Inhale 2 puffs into the lungs daily.    . furosemide (LASIX) 20 MG tablet Take 20 mg by mouth as directed. Take daily every 2-3 days    . hydrALAZINE (APRESOLINE) 25 MG tablet Take 1 tablet (25 mg total) by mouth 2 (two) times daily. 270 tablet 3  . lamoTRIgine (LAMICTAL) 100 MG tablet Take 200 mg by mouth 2 (two) times daily.     . Multiple Vitamin (MULTIVITAMIN) capsule Take 1 capsule by mouth every morning.     Marland Kitchen  nitroGLYCERIN (NITROSTAT) 0.4 MG SL tablet Place 1 tablet (0.4 mg total) under the tongue every 5 (five) minutes as needed for chest pain. 25 tablet 3  . oxyCODONE-acetaminophen (PERCOCET) 10-325 MG per tablet Take 1 tablet by mouth every 4 (four) hours.     . polyethylene glycol (MIRALAX / GLYCOLAX) packet Take 17 g by mouth daily as needed for mild constipation or moderate constipation.     . potassium chloride (KLOR-CON) 8 MEQ tablet Take 8 mEq by mouth every morning.     . sertraline (ZOLOFT) 50 MG tablet Take 50 mg by mouth every morning.     . tiZANidine (ZANAFLEX) 4 MG tablet tizanidine 4 mg tablet  TAKE 1 TABLET BY MOUTH 4TIMES A DAY AS NEEDED FOR  SPASMS    . valsartan (DIOVAN) 320 MG tablet Take 1 tablet (320 mg total) by mouth daily. 30 tablet 6  . amlodipine-atorvastatin (CADUET) 10-20 MG tablet Take 1 tablet by mouth daily. 30 tablet 6   No current facility-administered medications for this visit.    No Known Allergies  Past Medical History:  Diagnosis Date  . Alcoholism (HCC)    recovering since 2000  . Anxiety and depression   . Arthritis BACK  . Bipolar disorder (HCC)   . Blepharospasm LEFT EYE  . CHF exacerbation (HCC) 02/24/2017  . Chronic back pain   . COPD (chronic obstructive pulmonary disease) (HCC)   . Coronary artery disease CARDIOLOGIST- DR JORDAN--- LAST VISIT NOTE 09-07-2009  W/ CHART  . Emphysema   . History of alcohol abuse RECOVERING SINCE 2000  . History of Left renal mass 03/02/2012   underwent partial nephrectomy  . HTN (hypertension)   . Idiopathic acute facial nerve palsy LEFT SIDE--  BOTOX THERAPY  . Inferior MI (HCC) 2000--  POST PTCA W/ STENT X1  . Osteoporosis   . Other and unspecified general anesthetics causing adverse effect in therapeutic use post op delirium--  last anes record w/ chart  from   09-22-2009 (spinal w/ light sedation)  . Peripheral vascular disease (HCC) POST RIGHT CAROTID SURG.  1995  . Renal cell carcinoma (HCC) 07/09/12   Left mass  . Rosacea LEFT FACIAL RASH  . S/P radiation therapy 02/21/2012   38.75 Gy HDR 5 Fractions- vaginal cuff  . Scoliosis   . Seizures (HCC)    x 1 after abrupt discontinuation of  Clonidine  . Status post carotid endarterectomy RIGHT --  1995  . Status post primary angioplasty with coronary stent 2000--  POST INFERIOR MI  . Unstable balance WALKS W/ CANE  . Vaginal cancer (HCC)     Blood pressure (!) 160/94, pulse 76, resp. rate 15, height 5' 3" (1.6 m), weight 176 lb 9.6 oz (80.1 kg), SpO2 91 %.  HTN (hypertension) A/P: 1. Uncontrolled/Controlled HTN  - Pt BP today is 160/94  - Pt home BP readings tend to be about 160/75 on average  -  Pt BP is currently above goal of < 130/80 - Pt is currently on amlodipine-atorvastatin 5-20 mg and her SBP could benefit from an increase in amlodipine dose - Pt was advised to finish her current bottle of amlodipine-atorvastatin 5-20 mg and then initiate amlodipine-atorvastatin 10-20 mg once daily   - Have pt continue hydralazine 25 mg BID and valsartan 320 mg daily - Pt was advised to try to eat out less and try to cook more meals at home, along with eating more of the vegetables she likes  - Reassess BP   in 4-6 weeks to see if BP has come down after increasing amlodipine dose   Speser [place]  I was present for entire interview with patient and agree with above plan.    Tommy Medal PharmD CPP [facility] [address] [place], [place] [address] [phone]

## 2019-07-30 LAB — CYTOLOGY - PAP: Diagnosis: NEGATIVE

## 2019-07-31 ENCOUNTER — Telehealth: Payer: Self-pay

## 2019-07-31 NOTE — Telephone Encounter (Signed)
Told [name] that that the pap smear was normal. It is showing atrophic changes for her age and lack of estrogen. No intervention is neededper [name], NP.

## 2019-08-06 ENCOUNTER — Other Ambulatory Visit: Payer: Self-pay | Admitting: Medical

## 2019-08-08 ENCOUNTER — Other Ambulatory Visit: Payer: Self-pay

## 2019-08-08 ENCOUNTER — Ambulatory Visit (INDEPENDENT_AMBULATORY_CARE_PROVIDER_SITE_OTHER): Payer: Medicare Other | Admitting: Vascular Surgery

## 2019-08-08 ENCOUNTER — Encounter: Payer: Self-pay | Admitting: Vascular Surgery

## 2019-08-08 VITALS — BP 113/70 | HR 70 | Temp 98.5°F | Resp 20 | Ht 63.0 in | Wt 172.0 lb

## 2019-08-08 DIAGNOSIS — I6521 Occlusion and stenosis of right carotid artery: Secondary | ICD-10-CM | POA: Diagnosis not present

## 2019-08-08 DIAGNOSIS — I6523 Occlusion and stenosis of bilateral carotid arteries: Secondary | ICD-10-CM

## 2019-08-08 NOTE — Progress Notes (Signed)
Patient name: [name] MRN: [id number] DOB: Oct 30, 1945 Sex: female  REASON FOR CONSULT: Asymptomatic carotid stenosis  HPI: [name] is a 74 y.o. female, noted to have progression of common carotid stenosis on recent duplex exam.  The patient had a previous right carotid endarterectomy approximately 1995 in [place].  She did not know her Psychologist, sport and exercise.  This was done for an asymptomatic lesion.  She has not had any symptoms of TIA amaurosis or stroke.  She is currently on aspirin and a statin in the combo drug Caduet. other medical problems include multijoint arthritis, COPD, chronic back pain, bipolar disorder all of which have been stable.  Past Medical History:  Diagnosis Date  . Alcoholism ([place])    recovering since 2000  . Anxiety and depression   . Arthritis BACK  . Bipolar disorder ([place])   . Blepharospasm LEFT EYE  . CHF exacerbation ([place]) 02/24/2017  . Chronic back pain   . COPD (chronic obstructive pulmonary disease) ([place])   . Coronary artery disease CARDIOLOGIST- DR [place]--- LAST VISIT NOTE 09-07-2009  W/ CHART  . Emphysema   . History of alcohol abuse RECOVERING SINCE 2000  . History of Left renal mass 03/02/2012   underwent partial nephrectomy  . HTN (hypertension)   . Idiopathic acute facial nerve palsy LEFT SIDE--  BOTOX THERAPY  . Inferior MI ([place]) 2000--  POST PTCA W/ STENT X1  . Osteoporosis   . Other and unspecified general anesthetics causing adverse effect in therapeutic use post op delirium--  last anes record w/ chart  from   09-22-2009 (spinal w/ light sedation)  . Peripheral vascular disease ([place]) POST RIGHT CAROTID SURG.  1995  . Renal cell carcinoma ([place]) 07/09/12   Left mass  . Rosacea LEFT FACIAL RASH  . S/P radiation therapy 02/21/2012   38.75 Gy HDR 5 Fractions- vaginal cuff  . Scoliosis   . Seizures ([place])    x 1 after abrupt discontinuation of  Clonidine  . Status post carotid endarterectomy RIGHT --  1995  . Status post primary  angioplasty with coronary stent 2000--  POST INFERIOR MI  . Unstable balance WALKS W/ CANE  . Vaginal cancer [facility])    Past Surgical History:  Procedure Laterality Date  . BREAST EXCISIONAL BIOPSY Left 2019   b9 axilla Bx X 3  . CAROTID ENDARTERECTOMY  1995   RIGHT  . CATARACT EXTRACTION W/ INTRAOCULAR LENS  IMPLANT, BILATERAL    . CERVICAL CONIZATION W/BX  09-23-2008  . CORONARY ANGIOPLASTY WITH STENT PLACEMENT  2000-   INFERIOR MI   X1 STENT TO RCA  . EUS N/A 03/05/2012   Procedure: FULL UPPER ENDOSCOPIC ULTRASOUND (EUS) RADIAL and EGD;  Surgeon: Milus Banister, MD;  Location: [facility] ENDOSCOPY;  Service: Endoscopy;  Laterality: N/A;  ercp scope first than eus scope  . HEMIARTHROPLASTY HIP  12-26-2008   LEFT FEMORAL NECK FX  . ORIF HIP FRACTURE  02-13-2007   RIGHT FEMORAL NECK FX  . ORIF RIGHT DISTAL RADIUS AND RIGHT PROXIMAL HUMEROUS NECK FX'S  10-10-2005  . RIGHT SHOULDER SURG.  2007  . ROBOTIC ASSITED PARTIAL NEPHRECTOMY Left 07/09/2012   Procedure: ROBOTIC ASSITED PARTIAL NEPHRECTOMY;  Surgeon: Dutch Gray, MD;  Location: [facility] ORS;  Service: Urology;  Laterality: Left;  . TOTAL HIP ARTHROPLASTY  04-15-2008   POST FAILED  RIGHT HIP ORIF FEMORAL FX  . TOTAL KNEE ARTHROPLASTY  09-22-2009   RIGHT  . UPPER RIGHT VAGINAL REGION  12/28/11  BIOPSY: SQUAMOUS CELL CARCINOMA  . VAGINAL HYSTERECTOMY  07/06/2009   Secondary to dysplasia    Family History  Problem Relation Age of Onset  . Hypertension Mother   . Hypertension Father     SOCIAL HISTORY: Social History   Socioeconomic History  . Marital status: Divorced    Spouse name: Not on file  . Number of children: Not on file  . Years of education: Not on file  . Highest education level: Not on file  Occupational History  . Not on file  Tobacco Use  . Smoking status: Former Smoker    Packs/day: 2.00    Years: 50.00    Pack years: 100.00    Types: Cigarettes    Quit date: 01/18/2011    Years since quitting: 8.5  . Smokeless  tobacco: Never Used  . Tobacco comment: STATES QUIT SMOKING 01-18-2011  Vaping Use  . Vaping Use: Never used  Substance and Sexual Activity  . Alcohol use: Not Currently    Comment: RECOVERING ALCOHOLIC--   QUIT IN 9518  . Drug use: No  . Sexual activity: Never  Other Topics Concern  . Not on file  Social History Narrative  . Not on file   Social Determinants of Health   Financial Resource Strain:   . Difficulty of Paying Living Expenses:   Food Insecurity:   . Worried About Charity fundraiser in the Last Year:   . Arboriculturist in the Last Year:   Transportation Needs:   . Film/video editor (Medical):   Marland Kitchen Lack of Transportation (Non-Medical):   Physical Activity:   . Days of Exercise per Week:   . Minutes of Exercise per Session:   Stress:   . Feeling of Stress :   Social Connections:   . Frequency of Communication with Friends and Family:   . Frequency of Social Gatherings with Friends and Family:   . Attends Religious Services:   . Active Member of Clubs or Organizations:   . Attends Archivist Meetings:   Marland Kitchen Marital Status:   Intimate Partner Violence:   . Fear of Current or Ex-Partner:   . Emotionally Abused:   Marland Kitchen Physically Abused:   . Sexually Abused:     No Known Allergies  Current Outpatient Medications  Medication Sig Dispense Refill  . albuterol (PROVENTIL HFA;VENTOLIN HFA) 108 (90 BASE) MCG/ACT inhaler Inhale 2 puffs into the lungs every 6 (six) hours as needed for wheezing.    Marland Kitchen amlodipine-atorvastatin (CADUET) 10-20 MG tablet Take 1 tablet by mouth daily. 30 tablet 6  . ARIPiprazole (ABILIFY) 20 MG tablet Take 10 mg by mouth every morning.     Marland Kitchen aspirin EC 81 MG tablet Take 81 mg by mouth every morning.     . Calcium Carbonate-Vitamin D 600-400 MG-UNIT per tablet Take 1 tablet by mouth 2 (two) times daily.     . Cholecalciferol (VITAMIN D3) 2000 UNITS TABS Take 2,000 Units by mouth 2 (two) times daily.     Marland Kitchen dexlansoprazole (DEXILANT)  60 MG capsule Take 60 mg by mouth daily.    Marland Kitchen docusate sodium (COLACE) 100 MG capsule Take 100 mg by mouth as directed.    . Fluticasone-Umeclidin-Vilant (TRELEGY ELLIPTA IN) Inhale 2 puffs into the lungs daily.    . furosemide (LASIX) 20 MG tablet Take 20 mg by mouth as directed. Take daily every 2-3 days    . hydrALAZINE (APRESOLINE) 25 MG tablet Take 1 tablet (25 mg  total) by mouth 2 (two) times daily. 270 tablet 3  . lamoTRIgine (LAMICTAL) 100 MG tablet Take 200 mg by mouth 2 (two) times daily.     . Multiple Vitamin (MULTIVITAMIN) capsule Take 1 capsule by mouth every morning.     . nitroGLYCERIN (NITROSTAT) 0.4 MG SL tablet Place 1 tablet (0.4 mg total) under the tongue every 5 (five) minutes as needed for chest pain. 25 tablet 3  . oxyCODONE-acetaminophen (PERCOCET) 10-325 MG per tablet Take 1 tablet by mouth every 4 (four) hours.     . polyethylene glycol (MIRALAX / GLYCOLAX) packet Take 17 g by mouth daily as needed for mild constipation or moderate constipation.     . potassium chloride (KLOR-CON) 8 MEQ tablet Take 8 mEq by mouth every morning.     . sertraline (ZOLOFT) 50 MG tablet Take 50 mg by mouth every morning.     Marland Kitchen tiZANidine (ZANAFLEX) 4 MG tablet tizanidine 4 mg tablet  TAKE 1 TABLET BY MOUTH 4TIMES A DAY AS NEEDED FOR SPASMS    . valsartan (DIOVAN) 320 MG tablet TAKE 1 TABLET BY MOUTH EVERY DAY 90 tablet 2   No current facility-administered medications for this visit.    ROS:   General:  No weight loss, Fever, chills  HEENT: No recent headaches, no nasal bleeding, no visual changes, no sore throat  Neurologic: No dizziness, blackouts, seizures. No recent symptoms of stroke or mini- stroke. No recent episodes of slurred speech, or temporary blindness.  Cardiac: No recent episodes of chest pain/pressure, + shortness of breath at rest.  No shortness of breath with exertion.  Denies history of atrial fibrillation or irregular heartbeat  Vascular: No history of rest pain  in feet.  No history of claudication.  No history of non-healing ulcer, No history of DVT   Pulmonary: No home oxygen, no productive cough, no hemoptysis,  +asthma or wheezing  Musculoskeletal:  [X]  Arthritis, [X]  Low back pain,  [X]  Joint pain  Hematologic:No history of hypercoagulable state.  No history of easy bleeding.  No history of anemia  Gastrointestinal: No hematochezia or melena,  No gastroesophageal reflux, no trouble swallowing  Urinary: [ ]  chronic Kidney disease, [ ]  on HD - [ ]  MWF or [ ]  TTHS, [ ]  Burning with urination, [ ]  Frequent urination, [ ]  Difficulty urinating;   Skin: No rashes  Psychological: No history of anxiety,  No history of depression   Physical Examination  Vitals:   08/08/19 1258 08/08/19 1300  BP: (!) 130/78 113/70  Pulse: 70   Resp: 20   Temp: 98.5 F (36.9 C)   SpO2: 94%   Weight: 172 lb (78 kg)   Height: 5\' 3"  (1.6 m)     Body mass index is 30.47 kg/m.  General:  Alert and oriented, no acute distress HEENT: Normal Neck: No JVD, 2+ carotid pulses right more prominent than left Pulmonary: Clear to auscultation bilaterally Cardiac: Regular Rate and Rhythm  Skin: No rash Extremity Pulses:  2+ radial, brachial pulses bilaterally Musculoskeletal: No deformity or edema  Neurologic: Upper and lower extremity motor 5/5 and symmetric  DATA:  I reviewed the patient's recent carotid duplex exam from Dr. Doug Sou office.  This showed velocities in the right distal common carotid artery of 391/63.  No significant left-sided stenosis.  ASSESSMENT: Asymptomatic distal right common carotid artery stenosis.  Velocities are consistent with 60 to 80% stenosis.  This is on the side of her previous carotid endarterectomy.  I suspect  that it is probably at the proximal patch site.   PLAN: Signs and symptoms of TIA amaurosis and stroke discussed with the patient today.  She will call us if she develops any symptoms.  She will continue her aspirin and  statin.  If she remains asymptomatic we will repeat her duplex exam in 3 months time.  If she has any progression of disease or develops any symptoms we would proceed with CT angiogram for further evaluation.  Consideration for repair of redo if greater than 80% and asymptomatic or she develops symptoms.   Ruta Hinds, MD [facility] Office: [phone] Pager: [phone]

## 2019-08-12 DIAGNOSIS — G245 Blepharospasm: Secondary | ICD-10-CM | POA: Diagnosis not present

## 2019-08-12 DIAGNOSIS — H0100A Unspecified blepharitis right eye, upper and lower eyelids: Secondary | ICD-10-CM | POA: Diagnosis not present

## 2019-08-12 DIAGNOSIS — H16212 Exposure keratoconjunctivitis, left eye: Secondary | ICD-10-CM | POA: Diagnosis not present

## 2019-08-12 DIAGNOSIS — H04123 Dry eye syndrome of bilateral lacrimal glands: Secondary | ICD-10-CM | POA: Diagnosis not present

## 2019-08-13 ENCOUNTER — Other Ambulatory Visit: Payer: Self-pay | Admitting: *Deleted

## 2019-08-13 DIAGNOSIS — I6521 Occlusion and stenosis of right carotid artery: Secondary | ICD-10-CM

## 2019-08-22 DIAGNOSIS — F319 Bipolar disorder, unspecified: Secondary | ICD-10-CM | POA: Diagnosis not present

## 2019-08-22 DIAGNOSIS — E785 Hyperlipidemia, unspecified: Secondary | ICD-10-CM | POA: Diagnosis not present

## 2019-08-22 DIAGNOSIS — H6121 Impacted cerumen, right ear: Secondary | ICD-10-CM | POA: Diagnosis not present

## 2019-08-22 DIAGNOSIS — I10 Essential (primary) hypertension: Secondary | ICD-10-CM | POA: Diagnosis not present

## 2019-08-22 DIAGNOSIS — R0600 Dyspnea, unspecified: Secondary | ICD-10-CM | POA: Diagnosis not present

## 2019-08-22 DIAGNOSIS — J449 Chronic obstructive pulmonary disease, unspecified: Secondary | ICD-10-CM | POA: Diagnosis not present

## 2019-08-22 DIAGNOSIS — R7303 Prediabetes: Secondary | ICD-10-CM | POA: Diagnosis not present

## 2019-08-29 ENCOUNTER — Other Ambulatory Visit: Payer: Self-pay

## 2019-08-29 ENCOUNTER — Ambulatory Visit (INDEPENDENT_AMBULATORY_CARE_PROVIDER_SITE_OTHER): Payer: Medicare Other | Admitting: Pharmacist Clinician (PhC)/ Clinical Pharmacy Specialist

## 2019-08-29 DIAGNOSIS — I10 Essential (primary) hypertension: Secondary | ICD-10-CM

## 2019-08-29 DIAGNOSIS — I6523 Occlusion and stenosis of bilateral carotid arteries: Secondary | ICD-10-CM | POA: Diagnosis not present

## 2019-08-29 NOTE — Progress Notes (Signed)
08/29/2019 [name] 29-Mar-1945 [id number]   HPI:  [name] is a 74 y.o. female patient of Dr Martinique, with a [place] below who presents today for hypertension clinic evaluation.  She was last seen in the hypertension clinic last month and found to have a BP of 160/94.  This may have been an outlier, as it was recorded the day before at 111/69 at a different office.  She was asked to increase her amlodipine from 5 to 10 mg daily, continue other medications and return in one month.    Past Medical History: CAD Inferior MI with PCI to RCA 2000  CHF chronic combined, EF 45-50% in 02/2017, improved to 60-65% in 3/21, on valsartan, furosemide  COPD On Trelegy Ellipta,   hyperlipidemia 2/21: TC 176, TG 52, HDL 93, LDL 73 - on atorvastatin 20 mg daily    Blood Pressure Goal:  130/80  Current Medications: amlodipine 10 mg daily, hydralazine 25 mg bid, valsartan 320 mg qd  Family Hx: both parents had hypertension  Social Hx: no tobacco (quit 2013), no alcohol (recovering alcoholic, quit in 3267)  Diet: tries to eat at home more than take out, no added salt with home cooked foods;  mix of fruits and vegetables, protein mainly beef, some chicken, admits ice cream is her weakness; drinks 32 oz Coke Zero per day  Exercise: tries to walk around the block most days, has not been for past week or two due to heat/humidity.    Home BP readings: brings in a list of 32 home readings over past month.  Systolic average 124, diastolic - all but 1 reading WNL.    Intolerances: no cardiac medication intolerances  Labs: 8/21:  Na 143, K 4.3, Glu 123, BUN 22, SCr 0.84  CrCl 75.7  Wt Readings from Last 3 Encounters:  08/29/19 179 lb 12.8 oz (81.6 kg)  08/08/19 172 lb (78 kg)  07/25/19 176 lb 9.6 oz (80.1 kg)   BP Readings from Last 3 Encounters:  08/29/19 112/70  08/08/19 113/70  07/25/19 (!) 160/94   Pulse Readings from Last 3 Encounters:  08/29/19 (!) 58  08/08/19 70  07/25/19 76     Current Outpatient Medications  Medication Sig Dispense Refill  . albuterol (PROVENTIL HFA;VENTOLIN HFA) 108 (90 BASE) MCG/ACT inhaler Inhale 2 puffs into the lungs every 6 (six) hours as needed for wheezing.    Marland Kitchen amlodipine-atorvastatin (CADUET) 10-20 MG tablet Take 1 tablet by mouth daily. 30 tablet 6  . ARIPiprazole (ABILIFY) 20 MG tablet Take 10 mg by mouth every morning.     Marland Kitchen aspirin EC 81 MG tablet Take 81 mg by mouth every morning.     . Calcium Carbonate-Vitamin D 600-400 MG-UNIT per tablet Take 1 tablet by mouth 2 (two) times daily.     . Cholecalciferol (VITAMIN D3) 2000 UNITS TABS Take 2,000 Units by mouth 2 (two) times daily.     Marland Kitchen dexlansoprazole (DEXILANT) 60 MG capsule Take 60 mg by mouth daily.    Marland Kitchen docusate sodium (COLACE) 100 MG capsule Take 100 mg by mouth as directed.    . Fluticasone-Umeclidin-Vilant (TRELEGY ELLIPTA IN) Inhale 2 puffs into the lungs daily.    . furosemide (LASIX) 20 MG tablet Take 20 mg by mouth as directed. Take daily every 2-3 days    . hydrALAZINE (APRESOLINE) 25 MG tablet Take 1 tablet (25 mg total) by mouth 2 (two) times daily. 270 tablet 3  . lamoTRIgine (LAMICTAL) 100  MG tablet Take 200 mg by mouth 2 (two) times daily.     . Multiple Vitamin (MULTIVITAMIN) capsule Take 1 capsule by mouth every morning.     . nitroGLYCERIN (NITROSTAT) 0.4 MG SL tablet Place 1 tablet (0.4 mg total) under the tongue every 5 (five) minutes as needed for chest pain. 25 tablet 3  . oxyCODONE-acetaminophen (PERCOCET) 10-325 MG per tablet Take 1 tablet by mouth every 4 (four) hours.     . polyethylene glycol (MIRALAX / GLYCOLAX) packet Take 17 g by mouth daily as needed for mild constipation or moderate constipation.     . potassium chloride (KLOR-CON) 8 MEQ tablet Take 8 mEq by mouth every morning.     . sertraline (ZOLOFT) 50 MG tablet Take 50 mg by mouth every morning.     Marland Kitchen tiZANidine (ZANAFLEX) 4 MG tablet tizanidine 4 mg tablet  TAKE 1 TABLET BY MOUTH 4TIMES A  DAY AS NEEDED FOR SPASMS    . valsartan (DIOVAN) 320 MG tablet TAKE 1 TABLET BY MOUTH EVERY DAY 90 tablet 2   No current facility-administered medications for this visit.    Allergies  Allergen Reactions  . Doxycycline Diarrhea  . Gabapentin Other (See Comments)    sleepiness    Past Medical History:  Diagnosis Date  . Alcoholism ([place])    recovering since 2000  . Anxiety and depression   . Arthritis BACK  . Bipolar disorder ([place])   . Blepharospasm LEFT EYE  . CHF exacerbation ([place]) 02/24/2017  . Chronic back pain   . COPD (chronic obstructive pulmonary disease) ([place])   . Coronary artery disease CARDIOLOGIST- DR Martinique--- LAST VISIT NOTE 09-07-2009  W/ CHART  . Emphysema   . History of alcohol abuse RECOVERING SINCE 2000  . History of Left renal mass 03/02/2012   underwent partial nephrectomy  . HTN (hypertension)   . Idiopathic acute facial nerve palsy LEFT SIDE--  BOTOX THERAPY  . Inferior MI ([place]) 2000--  POST PTCA W/ STENT X1  . Osteoporosis   . Other and unspecified general anesthetics causing adverse effect in therapeutic use post op delirium--  last anes record w/ chart  from   09-22-2009 (spinal w/ light sedation)  . Peripheral vascular disease ([place]) POST RIGHT CAROTID SURG.  1995  . Renal cell carcinoma ([place]) 07/09/12   Left mass  . Rosacea LEFT FACIAL RASH  . S/P radiation therapy 02/21/2012   38.75 Gy HDR 5 Fractions- vaginal cuff  . Scoliosis   . Seizures ([place])    x 1 after abrupt discontinuation of  Clonidine  . Status post carotid endarterectomy RIGHT --  1995  . Status post primary angioplasty with coronary stent 2000--  POST INFERIOR MI  . Unstable balance WALKS W/ CANE  . Vaginal cancer ([facility])     Blood pressure 112/70, pulse (!) 58, resp. rate 15, height 5\' 3"  (1.6 m), weight 179 lb 12.8 oz (81.6 kg), SpO2 92 %.  HTN (hypertension) Patient with essential hypertension, well controlled in the office today.  Although her home readings average a little  higher, we are not going to make any changes to her medications at this time.  Patient declines the need for anything further, and with her in office readings much lower than home readings for the past couple of visits, it could be that her home meter is not quite accurate.  She is aware to continue with home monitoring 3-4 days per week, and should she see readings > 308 systolic,  she will need to contact the office for follow up.  Patient agreeable with this plan.     Tommy Medal PharmD CPP [facility] [address] [place], [place] [address] [phone]

## 2019-08-29 NOTE — Patient Instructions (Signed)
  Check your blood pressure at home daily (3-4 days per week) and keep record of the readings.  Take your BP meds as follows:  Continue with all current medications  Bring all of your meds, your BP cuff and your record of home blood pressures to your next appointment.  Exercise as you're able, try to walk approximately 30 minutes per day.  Keep salt intake to a minimum, especially watch canned and prepared boxed foods.  Eat more fresh fruits and vegetables and fewer canned items.  Avoid eating in fast food restaurants.    HOW TO TAKE YOUR BLOOD PRESSURE: . Rest 5 minutes before taking your blood pressure. .  Don't smoke or drink caffeinated beverages for at least 30 minutes before. . Take your blood pressure before (not after) you eat. . Sit comfortably with your back supported and both feet on the floor (don't cross your legs). . Elevate your arm to heart level on a table or a desk. . Use the proper sized cuff. It should fit smoothly and snugly around your bare upper arm. There should be enough room to slip a fingertip under the cuff. The bottom edge of the cuff should be 1 inch above the crease of the elbow. . Ideally, take 3 measurements at one sitting and record the average.

## 2019-08-29 NOTE — Assessment & Plan Note (Signed)
Patient with essential hypertension, well controlled in the office today.  Although her home readings average a little higher, we are not going to make any changes to her medications at this time.  Patient declines the need for anything further, and with her in office readings much lower than home readings for the past couple of visits, it could be that her home meter is not quite accurate.  She is aware to continue with home monitoring 3-4 days per week, and should she see readings > 931 systolic, she will need to contact the office for follow up.  Patient agreeable with this plan.

## 2019-08-29 NOTE — Progress Notes (Deleted)
Patient ID: [name]                 DOB: 05/22/1945                      MRN: [id number]     HPI:  [name] is a 74 y.o. female referred by Dr. Martinique to HTN clinic.  Current HTN meds:  Caduet 10-20mg  daily  Previously tried:  Irbesartan  Clonidine labetalol  BP goal: <130/80  Family History:   Social History:   Diet:   Exercise:   Home BP readings:   Wt Readings from Last 3 Encounters:  08/08/19 172 lb (78 kg)  07/25/19 176 lb 9.6 oz (80.1 kg)  07/24/19 176 lb (79.8 kg)   BP Readings from Last 3 Encounters:  08/08/19 113/70  07/25/19 (!) 160/94  07/24/19 111/69   Pulse Readings from Last 3 Encounters:  08/08/19 70  07/25/19 76  07/24/19 (!) 58    Renal function: CrCl cannot be calculated (Patient's most recent lab result is older than the maximum 21 days allowed.).  Past Medical History:  Diagnosis Date  . Alcoholism ([place])    recovering since 2000  . Anxiety and depression   . Arthritis BACK  . Bipolar disorder ([place])   . Blepharospasm LEFT EYE  . CHF exacerbation ([place]) 02/24/2017  . Chronic back pain   . COPD (chronic obstructive pulmonary disease) ([place])   . Coronary artery disease CARDIOLOGIST- DR Martinique--- LAST VISIT NOTE 09-07-2009  W/ CHART  . Emphysema   . History of alcohol abuse RECOVERING SINCE 2000  . History of Left renal mass 03/02/2012   underwent partial nephrectomy  . HTN (hypertension)   . Idiopathic acute facial nerve palsy LEFT SIDE--  BOTOX THERAPY  . Inferior MI ([place]) 2000--  POST PTCA W/ STENT X1  . Osteoporosis   . Other and unspecified general anesthetics causing adverse effect in therapeutic use post op delirium--  last anes record w/ chart  from   09-22-2009 (spinal w/ light sedation)  . Peripheral vascular disease ([place]) POST RIGHT CAROTID SURG.  1995  . Renal cell carcinoma ([place]) 07/09/12   Left mass  . Rosacea LEFT FACIAL RASH  . S/P radiation therapy 02/21/2012   38.75 Gy HDR 5 Fractions- vaginal cuff  .  Scoliosis   . Seizures ([place])    x 1 after abrupt discontinuation of  Clonidine  . Status post carotid endarterectomy RIGHT --  1995  . Status post primary angioplasty with coronary stent 2000--  POST INFERIOR MI  . Unstable balance WALKS W/ CANE  . Vaginal cancer [facility])     Current Outpatient Medications on File Prior to Visit  Medication Sig Dispense Refill  . albuterol (PROVENTIL HFA;VENTOLIN HFA) 108 (90 BASE) MCG/ACT inhaler Inhale 2 puffs into the lungs every 6 (six) hours as needed for wheezing.    Marland Kitchen amlodipine-atorvastatin (CADUET) 10-20 MG tablet Take 1 tablet by mouth daily. 30 tablet 6  . ARIPiprazole (ABILIFY) 20 MG tablet Take 10 mg by mouth every morning.     Marland Kitchen aspirin EC 81 MG tablet Take 81 mg by mouth every morning.     . Calcium Carbonate-Vitamin D 600-400 MG-UNIT per tablet Take 1 tablet by mouth 2 (two) times daily.     . Cholecalciferol (VITAMIN D3) 2000 UNITS TABS Take 2,000 Units by mouth 2 (two) times daily.     Marland Kitchen dexlansoprazole (DEXILANT) 60 MG capsule Take 60 mg  by mouth daily.    Marland Kitchen docusate sodium (COLACE) 100 MG capsule Take 100 mg by mouth as directed.    . Fluticasone-Umeclidin-Vilant (TRELEGY ELLIPTA IN) Inhale 2 puffs into the lungs daily.    . furosemide (LASIX) 20 MG tablet Take 20 mg by mouth as directed. Take daily every 2-3 days    . hydrALAZINE (APRESOLINE) 25 MG tablet Take 1 tablet (25 mg total) by mouth 2 (two) times daily. 270 tablet 3  . lamoTRIgine (LAMICTAL) 100 MG tablet Take 200 mg by mouth 2 (two) times daily.     . Multiple Vitamin (MULTIVITAMIN) capsule Take 1 capsule by mouth every morning.     . nitroGLYCERIN (NITROSTAT) 0.4 MG SL tablet Place 1 tablet (0.4 mg total) under the tongue every 5 (five) minutes as needed for chest pain. 25 tablet 3  . oxyCODONE-acetaminophen (PERCOCET) 10-325 MG per tablet Take 1 tablet by mouth every 4 (four) hours.     . polyethylene glycol (MIRALAX / GLYCOLAX) packet Take 17 g by mouth daily as needed for  mild constipation or moderate constipation.     . potassium chloride (KLOR-CON) 8 MEQ tablet Take 8 mEq by mouth every morning.     . sertraline (ZOLOFT) 50 MG tablet Take 50 mg by mouth every morning.     Marland Kitchen tiZANidine (ZANAFLEX) 4 MG tablet tizanidine 4 mg tablet  TAKE 1 TABLET BY MOUTH 4TIMES A DAY AS NEEDED FOR SPASMS    . valsartan (DIOVAN) 320 MG tablet TAKE 1 TABLET BY MOUTH EVERY DAY 90 tablet 2   No current facility-administered medications on file prior to visit.    No Known Allergies  There were no vitals taken for this visit.  No problem-specific Assessment & Plan notes found for this encounter.   Kerissa Coia Rodriguez-Guzman PharmD, BCPS, [facility] [address] [place] [address] 08/29/2019 1:39 PM

## 2019-09-02 ENCOUNTER — Ambulatory Visit: Payer: Medicare Other | Admitting: Primary Care

## 2019-09-02 ENCOUNTER — Ambulatory Visit (INDEPENDENT_AMBULATORY_CARE_PROVIDER_SITE_OTHER): Payer: Medicare Other | Admitting: Primary Care

## 2019-09-02 ENCOUNTER — Ambulatory Visit (INDEPENDENT_AMBULATORY_CARE_PROVIDER_SITE_OTHER): Payer: Medicare Other

## 2019-09-02 ENCOUNTER — Other Ambulatory Visit: Payer: Self-pay

## 2019-09-02 VITALS — BP 128/68 | HR 60 | Temp 98.3°F | Ht 63.0 in | Wt 180.0 lb

## 2019-09-02 DIAGNOSIS — J439 Emphysema, unspecified: Secondary | ICD-10-CM

## 2019-09-02 DIAGNOSIS — R0602 Shortness of breath: Secondary | ICD-10-CM | POA: Diagnosis not present

## 2019-09-02 DIAGNOSIS — I5032 Chronic diastolic (congestive) heart failure: Secondary | ICD-10-CM | POA: Diagnosis not present

## 2019-09-02 DIAGNOSIS — R21 Rash and other nonspecific skin eruption: Secondary | ICD-10-CM | POA: Diagnosis not present

## 2019-09-02 NOTE — Progress Notes (Signed)
@Patient  ID: [name], female    DOB: [birth year], 74 y.o.   MRN: [id number]  Chief Complaint  Patient presents with  . Follow-up    Increased sob past 4 weeks - Seen PCP 2 weeks ago and was given Prednisone andf finished it 2 days ago - Sob even with sitting still - denies cough or chest discomfort - Denies Fever - Denies covid exposure - Pt has had vaccine -Using Albuterol inh approx 2 times a day     Referring provider: Harlan Stains, MD  HPI: 74 year old female, former smoker. PMH significant for COPD, hypoxia, CHF, CAD, hypertension, PVD, arthritis, emphysema, chronic kypho-scoliosis. Patient of Dr. Carlis Abbott, last seen by pulmonary NP on 07/02/19.   Previous [facility] pulmonary encounters: 07/02/2019 Follow up: COPD /Emphysema  Patient presents for a 72-month follow-up.  Patient was seen last visit for pulmonary consult for COPD and shortness of breath.  Patient had been diagnosed with COPD in the past and was on [place].  Patient was set up for pulmonary function testing which was done today showed no mild airflow obstruction with an FEV1 at 91%, ratio 69, FVC 99%, no significant bronchodilator response, moderate mid flow obstruction, DLCO 53%.  We reviewed her test results. As above CT chest September 2020 showed bilateral emphysema worse on the right and bilateral basilar cystic changes.  With mild focal right lower lobe subpleural fibrosis. Patient was also suspected to have underlying reflux and was recommended on a GERD diet along with Dexilant daily.  Since last visit patient says she is feeling better.  She was changed to Trelegy by her primary care provider feels that it is working better than her other inhalers she has less albuterol use.  And feels that her breathing has gotten better.  She also is starting to walk more frequently and feels that her activity tolerance has improved since last visit.  Her Covid vaccine is up-to-date.  09/02/2019- Interim hx Patient presents  today for 2 month fu for COPD with emphysema. Patient reports increased shortness of breath over the last 2-4 weeks. States that she has been waking up feeling short of breath in the middle of the night. She was given course of prednisone from her PCP which she states worked really well. She has been off steriod for two days and can feel her symptoms coming back.  She was changed to Trelegy by her PCP. She states that she isn't sure if she is taking it right. Feels it isn't helping as much as what she was on previously which was spiriva and dulera.  She reports sporatic shortness of breath at rest. She has a cough with yellow mucus. Her weight is up 8 lbs in the last 3 moths. Her partner does states that she snores. No witnessed apnea. No BD response on PFTs. Denies fevers, chills, hemoptysis, chest pain, chest tightness, wheezing.    TEST/EVENTS :  CXR, 2 view 02/25/2019-kyphoscoliosis.  Flattened hemidiaphragms bilaterally.  Increased right upper lobe lucency suspicious for bullous disease.  10/01/2018 LD CT chest-bilateral upper lobe panlobular emphysema, worse on the right.  Bilateral basilar cystic changes.  Mild focal right lower lobe subpleural fibrosis no masses.  Patulous esophagus.  Significant vascular calcification.  No obvious mediastinal or hilar adenopathy.  Lung RADS 1  Echocardiogram 03/20/2019: LVEF 60 to 40%, grade 1 diastolic dysfunction.  Severely dilated LA, normal RV.  Mildly elevated PASP.  Normal RA.  Mild MR and TR.  Allergies  Allergen  Reactions  . Doxycycline Diarrhea  . Gabapentin Other (See Comments)    sleepiness    Immunization History  Administered Date(s) Administered  . Fluad Quad(high Dose 65+) 11/01/2018  . Influenza Split 01/18/2008, 11/10/2009  . Influenza, High Dose Seasonal PF 09/30/2011  . Influenza,inj,quad, With Preservative 10/17/2016  . Influenza-Unspecified 12/07/2010, 09/17/2013  . Moderna SARS-COVID-2 Vaccination 03/01/2019, 03/29/2019  .  Pneumococcal-Unspecified 01/18/2008, 04/12/2010  . Tdap 04/25/2011  . Zoster 04/28/2010  . Zoster Recombinat (Shingrix) 08/06/2017    Past Medical History:  Diagnosis Date  . Alcoholism ([place])    recovering since 2000  . Anxiety and depression   . Arthritis BACK  . Bipolar disorder ([place])   . Blepharospasm LEFT EYE  . CHF exacerbation ([place]) 02/24/2017  . Chronic back pain   . COPD (chronic obstructive pulmonary disease) ([place])   . Coronary artery disease CARDIOLOGIST- DR [place]--- LAST VISIT NOTE 09-07-2009  W/ CHART  . Emphysema   . History of alcohol abuse RECOVERING SINCE 2000  . History of Left renal mass 03/02/2012   underwent partial nephrectomy  . HTN (hypertension)   . Idiopathic acute facial nerve palsy LEFT SIDE--  BOTOX THERAPY  . Inferior MI ([place]) 2000--  POST PTCA W/ STENT X1  . Osteoporosis   . Other and unspecified general anesthetics causing adverse effect in therapeutic use post op delirium--  last anes record w/ chart  from   09-22-2009 (spinal w/ light sedation)  . Peripheral vascular disease ([place]) POST RIGHT CAROTID SURG.  1995  . Renal cell carcinoma ([place]) 07/09/12   Left mass  . Rosacea LEFT FACIAL RASH  . S/P radiation therapy 02/21/2012   38.75 Gy HDR 5 Fractions- vaginal cuff  . Scoliosis   . Seizures ([place])    x 1 after abrupt discontinuation of  Clonidine  . Status post carotid endarterectomy RIGHT --  1995  . Status post primary angioplasty with coronary stent 2000--  POST INFERIOR MI  . Unstable balance WALKS W/ CANE  . Vaginal cancer ([place])     Tobacco History: Social History   Tobacco Use  Smoking Status Former Smoker  . Packs/day: 2.00  . Years: 50.00  . Pack years: 100.00  . Types: Cigarettes  . Quit date: 01/18/2011  . Years since quitting: 8.6  Smokeless Tobacco Never Used  Tobacco Comment   STATES QUIT SMOKING 01-18-2011   Counseling given: Not Answered Comment: STATES QUIT SMOKING 01-18-2011   Outpatient Medications Prior to Visit    Medication Sig Dispense Refill  . albuterol (PROVENTIL HFA;VENTOLIN HFA) 108 (90 BASE) MCG/ACT inhaler Inhale 2 puffs into the lungs every 6 (six) hours as needed for wheezing.    Marland Kitchen amlodipine-atorvastatin (CADUET) 10-20 MG tablet Take 1 tablet by mouth daily. 30 tablet 6  . ARIPiprazole (ABILIFY) 20 MG tablet Take 10 mg by mouth every morning.     Marland Kitchen aspirin EC 81 MG tablet Take 81 mg by mouth every morning.     . Calcium Carbonate-Vitamin D 600-400 MG-UNIT per tablet Take 1 tablet by mouth 2 (two) times daily.     . Cholecalciferol (VITAMIN D3) 2000 UNITS TABS Take 2,000 Units by mouth 2 (two) times daily.     Marland Kitchen dexlansoprazole (DEXILANT) 60 MG capsule Take 60 mg by mouth daily.    Marland Kitchen docusate sodium (COLACE) 100 MG capsule Take 100 mg by mouth as directed.    . Fluticasone-Umeclidin-Vilant (TRELEGY ELLIPTA IN) Inhale 2 puffs into the lungs daily.    . furosemide (  LASIX) 20 MG tablet Take 20 mg by mouth as directed. Take daily every 2-3 days    . hydrALAZINE (APRESOLINE) 25 MG tablet Take 1 tablet (25 mg total) by mouth 2 (two) times daily. 270 tablet 3  . lamoTRIgine (LAMICTAL) 100 MG tablet Take 200 mg by mouth 2 (two) times daily.     . Multiple Vitamin (MULTIVITAMIN) capsule Take 1 capsule by mouth every morning.     . nitroGLYCERIN (NITROSTAT) 0.4 MG SL tablet Place 1 tablet (0.4 mg total) under the tongue every 5 (five) minutes as needed for chest pain. 25 tablet 3  . oxyCODONE-acetaminophen (PERCOCET) 10-325 MG per tablet Take 1 tablet by mouth every 4 (four) hours.     . polyethylene glycol (MIRALAX / GLYCOLAX) packet Take 17 g by mouth daily as needed for mild constipation or moderate constipation.     . potassium chloride (KLOR-CON) 8 MEQ tablet Take 8 mEq by mouth every morning.     . sertraline (ZOLOFT) 50 MG tablet Take 50 mg by mouth every morning.     Marland Kitchen tiZANidine (ZANAFLEX) 4 MG tablet tizanidine 4 mg tablet  TAKE 1 TABLET BY MOUTH 4TIMES A DAY AS NEEDED FOR SPASMS    .  valsartan (DIOVAN) 320 MG tablet TAKE 1 TABLET BY MOUTH EVERY DAY 90 tablet 2   No facility-administered medications prior to visit.    Review of Systems  Review of Systems  Constitutional: Negative.   HENT: Negative.   Respiratory: Positive for cough and shortness of breath. Negative for chest tightness and wheezing.   Cardiovascular: Negative.  Negative for leg swelling.  Psychiatric/Behavioral: Negative for sleep disturbance.    Physical Exam  BP 128/68   Pulse 60   Temp 98.3 F (36.8 C) (Oral)   Ht 5\' 3"  (1.6 m)   Wt 180 lb (81.6 kg)   SpO2 93%   BMI 31.89 kg/m  Physical Exam Constitutional:      Appearance: Normal appearance.  HENT:     Head: Normocephalic and atraumatic.  Cardiovascular:     Rate and Rhythm: Normal rate and regular rhythm.  Pulmonary:     Effort: Pulmonary effort is normal.     Breath sounds: Normal breath sounds. No wheezing or rales.  Musculoskeletal:        General: No swelling.  Skin:    General: Skin is warm.  Neurological:     General: No focal deficit present.     Mental Status: She is alert and oriented to person, place, and time. Mental status is at baseline.  Psychiatric:        Mood and Affect: Mood normal.        Behavior: Behavior normal.        Thought Content: Thought content normal.        Judgment: Judgment normal.      Lab Results:  CBC    Component Value Date/Time   WBC 5.9 02/24/2017 2339   RBC 4.62 02/24/2017 2339   HGB 15.2 (H) 02/24/2017 2339   HCT 42.3 02/24/2017 2339   PLT 295 02/24/2017 2339   MCV 91.6 02/24/2017 2339   MCH 32.9 02/24/2017 2339   MCHC 35.9 02/24/2017 2339   RDW 12.7 02/24/2017 2339   LYMPHSABS 1.5 02/24/2017 1902   MONOABS 0.5 02/24/2017 1902   EOSABS 0.3 02/24/2017 1902   BASOSABS 0.0 02/24/2017 1902    BMET    Component Value Date/Time   NA 139 03/06/2019 1649   K 4.6  03/06/2019 1649   CL 102 03/06/2019 1649   CO2 22 03/06/2019 1649   GLUCOSE 92 03/06/2019 1649   GLUCOSE  107 (H) 02/25/2017 0700   BUN 25 03/06/2019 1649   CREATININE 0.90 03/06/2019 1649   CALCIUM 10.4 (H) 03/06/2019 1649   GFRNONAA 63 03/06/2019 1649   GFRAA 73 03/06/2019 1649    BNP    Component Value Date/Time   BNP 78.3 03/06/2019 1649   BNP 274.9 (H) 02/24/2017 1902    ProBNP    Component Value Date/Time   PROBNP 140.0 (H) 12/29/2008 0925    Imaging: DG Chest 2 View  Result Date: 09/02/2019 CLINICAL DATA:  74 year old female with shortness of breath. EXAM: CHEST - 2 VIEW COMPARISON:  Chest radiograph dated 02/25/2019 FINDINGS: Background of emphysema. No focal consolidation, pleural effusion, pneumothorax. Focal area of calcified pleural plaque along the right upper lobe. The cardiac silhouette is within limits. Atherosclerotic calcification of the aorta. Osteopenia with degenerative changes of the spine. No acute osseous pathology. Right humeral head fixation hardware. IMPRESSION: 1. No active cardiopulmonary disease. 2. Emphysema. Electronically Signed   By: Anner Crete M.D.   On: 09/02/2019 17:29     Assessment & Plan:   COPD (chronic obstructive pulmonary disease) ([place]) - Patient reports increased shortness of breath over the last 2-4 weeks occurring more at night. Exam benign today. She appears well. We will check CXR and labs. Continues Trelegy, recommend discussing with her primary care about resuming Dulera twice a day and Spiriva which seemed to control her symptoms better.  - Continue albuterol 2 puffs every 4-6 hours as needed for shortness of breath or wheezing  - Monitor sleeping habits, may consider sleep consult to rule out sleep apnea if improvement  - Follow-up as previously recommended with Dr. Carlis Abbott (due in October 2021)      Martyn Ehrich, NP 09/03/2019

## 2019-09-02 NOTE — Patient Instructions (Addendum)
Orders: - CXR and labs today (ordered)  Recommendations: - Continue Trelegy for now. Would recommend you resume Dulera/Spiriva (discuss with PCP) - Use albuterol 2 puffs every 4-6 hours as needed for shortness of breath or wheezing  - Monitor sleeping habits- do you snore loudly, stop breathing, wake up frequency (you want to have a sleep consult to rule out sleep apnea)  Follow-up: - As previously recommended with Dr. Carlis Abbott (due in October 2021)

## 2019-09-02 NOTE — Progress Notes (Signed)
Please let patient know CXR showed emphysema, no acute process such as pneumonia or pleural effusion. She has pleural plaque right upper lobe might be from possible hx asbestosis exposure-This does not need any additional follow-up right now. Previous lung cancer screening showed no worrisome pulmonary nodules.

## 2019-09-03 ENCOUNTER — Encounter: Payer: Self-pay | Admitting: Primary Care

## 2019-09-03 LAB — CBC WITH DIFFERENTIAL/PLATELET
Basophils Absolute: 0.1 10*3/uL (ref 0.0–0.1)
Basophils Relative: 0.7 % (ref 0.0–3.0)
Eosinophils Absolute: 0.4 10*3/uL (ref 0.0–0.7)
Eosinophils Relative: 4.4 % (ref 0.0–5.0)
HCT: 41.7 % (ref 36.0–46.0)
Hemoglobin: 14.4 g/dL (ref 12.0–15.0)
Lymphocytes Relative: 26.4 % (ref 12.0–46.0)
Lymphs Abs: 2.4 10*3/uL (ref 0.7–4.0)
MCHC: 34.5 g/dL (ref 30.0–36.0)
MCV: 98.4 fl (ref 78.0–100.0)
Monocytes Absolute: 0.7 10*3/uL (ref 0.1–1.0)
Monocytes Relative: 7.9 % (ref 3.0–12.0)
Neutro Abs: 5.5 10*3/uL (ref 1.4–7.7)
Neutrophils Relative %: 60.6 % (ref 43.0–77.0)
Platelets: 304 10*3/uL (ref 150.0–400.0)
RBC: 4.24 Mil/uL (ref 3.87–5.11)
RDW: 14.5 % (ref 11.5–15.5)
WBC: 9.1 10*3/uL (ref 4.0–10.5)

## 2019-09-03 LAB — BRAIN NATRIURETIC PEPTIDE: Pro B Natriuretic peptide (BNP): 166 pg/mL — ABNORMAL HIGH (ref 0.0–100.0)

## 2019-09-03 MED ORDER — MONTELUKAST SODIUM 10 MG PO TABS
10.0000 mg | ORAL_TABLET | Freq: Every day | ORAL | 3 refills | Status: DC
Start: 2019-09-03 — End: 2019-12-11

## 2019-09-03 NOTE — Progress Notes (Signed)
Spoke with patient and provided instructions/results per Derl Barrow NP.  Patient verbalized understanding.  Nothing further needed.

## 2019-09-03 NOTE — Progress Notes (Signed)
See above message. BNP and eosinophils were elevated. Recommend starting a medication called Singulair 10mg  at bedtime for possible allergies driving her shortness of breath. Also recommend she take additional 20mg  lasix once daily x 3-5 days

## 2019-09-03 NOTE — Addendum Note (Signed)
Addended by: Martyn Ehrich on: 09/03/2019 03:59 PM   Modules accepted: Orders

## 2019-09-03 NOTE — Progress Notes (Signed)
Labs showed elevated eosinophil and BNP. Recommend starting Singulair 10mg  at bedtime and take additional lasix 20mg  once daily x 3-5 days

## 2019-09-03 NOTE — Assessment & Plan Note (Signed)
-   Patient reports increased shortness of breath over the last 2-4 weeks occurring more at night. Exam benign today. She appears well. We will check CXR and labs. Continues Trelegy, recommend discussing with her primary care about resuming Dulera twice a day and Spiriva which seemed to control her symptoms better.  - Continue albuterol 2 puffs every 4-6 hours as needed for shortness of breath or wheezing  - Monitor sleeping habits, may consider sleep consult to rule out sleep apnea if improvement  - Follow-up as previously recommended with Dr. Carlis Abbott (due in October 2021)

## 2019-09-11 DIAGNOSIS — Z79899 Other long term (current) drug therapy: Secondary | ICD-10-CM | POA: Diagnosis not present

## 2019-10-11 ENCOUNTER — Other Ambulatory Visit: Payer: Self-pay | Admitting: *Deleted

## 2019-10-11 DIAGNOSIS — Z87891 Personal history of nicotine dependence: Secondary | ICD-10-CM

## 2019-10-15 DIAGNOSIS — G518 Other disorders of facial nerve: Secondary | ICD-10-CM | POA: Diagnosis not present

## 2019-10-15 DIAGNOSIS — G245 Blepharospasm: Secondary | ICD-10-CM | POA: Diagnosis not present

## 2019-10-23 ENCOUNTER — Other Ambulatory Visit: Payer: Self-pay | Admitting: Family Medicine

## 2019-10-23 DIAGNOSIS — Z1231 Encounter for screening mammogram for malignant neoplasm of breast: Secondary | ICD-10-CM

## 2019-10-24 ENCOUNTER — Telehealth: Payer: Self-pay | Admitting: Acute Care

## 2019-10-24 ENCOUNTER — Other Ambulatory Visit: Payer: Self-pay

## 2019-10-24 ENCOUNTER — Ambulatory Visit
Admission: RE | Admit: 2019-10-24 | Discharge: 2019-10-24 | Disposition: A | Discharge status: Home / Self Care | Payer: Medicare Other | Source: Ambulatory Visit | Attending: Acute Care | Admitting: Acute Care

## 2019-10-24 DIAGNOSIS — Z87891 Personal history of nicotine dependence: Secondary | ICD-10-CM | POA: Diagnosis not present

## 2019-10-24 DIAGNOSIS — S22080A Wedge compression fracture of T11-T12 vertebra, initial encounter for closed fracture: Secondary | ICD-10-CM | POA: Diagnosis not present

## 2019-10-24 DIAGNOSIS — I2510 Atherosclerotic heart disease of native coronary artery without angina pectoris: Secondary | ICD-10-CM | POA: Diagnosis not present

## 2019-10-24 DIAGNOSIS — I700 Atherosclerosis of aorta: Secondary | ICD-10-CM | POA: Diagnosis not present

## 2019-10-24 NOTE — Telephone Encounter (Signed)
Spoke with Valarie Merino from [facility] who was calling with results from CT today. Sarah please advise   IMPRESSION: 1. Lung-RADS 4A, suspicious. New indistinct 7.8 mm anterior left upper lobe pulmonary nodule. Follow up low-dose chest CT without contrast in 3 months (please use the following order, "CT CHEST LCS NODULE FOLLOW-UP /O CM") is recommended. 2. Three-vessel coronary atherosclerosis. 3. Stable mild cardiomegaly. 4. Aortic Atherosclerosis (ICD10-I70.0) and Emphysema (ICD10-J43.9).  These results will be called to the ordering clinician or representative by the Radiologist Assistant, and communication documented in the PACS or [employer].   Electronically Signed   By: Ilona Sorrel M.D.   On: 10/24/2019 15:52

## 2019-10-28 ENCOUNTER — Other Ambulatory Visit: Payer: Self-pay

## 2019-10-28 ENCOUNTER — Ambulatory Visit (INDEPENDENT_AMBULATORY_CARE_PROVIDER_SITE_OTHER): Payer: Medicare Other | Admitting: Critical Care Medicine

## 2019-10-28 ENCOUNTER — Encounter: Payer: Self-pay | Admitting: Critical Care Medicine

## 2019-10-28 VITALS — BP 120/66 | HR 52 | Temp 97.7°F | Ht 63.0 in | Wt 182.6 lb

## 2019-10-28 DIAGNOSIS — J439 Emphysema, unspecified: Secondary | ICD-10-CM | POA: Diagnosis not present

## 2019-10-28 DIAGNOSIS — R911 Solitary pulmonary nodule: Secondary | ICD-10-CM | POA: Diagnosis not present

## 2019-10-28 DIAGNOSIS — J9611 Chronic respiratory failure with hypoxia: Secondary | ICD-10-CM | POA: Diagnosis not present

## 2019-10-28 DIAGNOSIS — Z87891 Personal history of nicotine dependence: Secondary | ICD-10-CM | POA: Diagnosis not present

## 2019-10-28 MED ORDER — IPRATROPIUM-ALBUTEROL 0.5-2.5 (3) MG/3ML IN SOLN: 3.0000 mL | Freq: Two times a day (BID) | RESPIRATORY_TRACT | 11 refills | Status: DC

## 2019-10-28 NOTE — Progress Notes (Signed)
Synopsis: Referred in April 2021 for DOE, COPD by Harlan Stains, MD.  Subjective:   PATIENT ID: [name] GENDER: female DOB: 12/19/1945, MRN: [id number]  Chief Complaint  Patient presents with  . Follow-up    CT done on 10/7, shortness of breath all the time worse with exertion, denies cough    [name] is a 74 y/o woman with a history of COPD who presents for follow up. Her DOE has been worse recently and she has been having episodes where she wakes up short of breath in the middle the night.  She has episodes of shortness of breath even at rest.  This has been ongoing for several months.  She uses Ventolin multiple times per day, with improvement in her symptoms temporarily.  She has had some wheezing, but dyspnea is her main complaint.  Her primary care provider attempted to switch her Spiriva and Dulera to Trelegy to see if this would improve her symptoms, but it did not change them.  She switch back to [place].  She is scheduled for her flu shot on 11/7.  Note reviewed from 09/02/2019 visit with NP Derl Barrow.     OV 05/02/19: [name] is a 74 year old woman with a history of tobacco abuse and emphysema who presents for evaluation of shortness of breath.  She is accompanied today by her friend [name].  She was diagnosed with COPD many years ago and is maintained on Dulera twice daily, Spiriva once daily, and albuterol as needed, which she uses 2-3 times per day.  Recently she has not noticed a difference with using her albuterol.  She was most recently on a prednisone taper in February, and was on 1 about 1 to 2 months prior to that.  She feels like her breathing is perfect when she is on prednisone.  She has been noticing recently that she gets out of breath faster when she is walking.  She has episodes of shortness of breath that come on suddenly.  In the middle of the night when she wakes up to drink a Coke she notices sudden shortness of breath after this when  she gets back in her recliner.  These do not wake her up from sleep or occur when she is walking to get a drink.  She denies wheezing.  No cough or sputum.  She has shortness of breath episodes during the day that she does not know if there is anything that precipitates them.  She drinks Coke very frequently and seldom drinks water.  She is unable to sleep in a bed and frequently is in her recliner throughout the day.  She is a history of 3 packs/day smoking x50 years prior to quitting in 2013.  She has a history of chronic GERD for which she takes Dexilant.  She denies heartburn, reflux sensation, or indigestion.  She has a history of HFpEF, and recently required increased Lasix dosing due to edema.  Her edema has improved.  Due to a leg fracture was discovered last week she has stopped taking her Lasix because she is unable to go to the bathroom as frequently.  She is also been drinking less due to her mobility issues.  She has chronic scoliosis.  She has never been hospitalized for COPD in the past.  PCP Dr. Orest Dikes 03/11/2019 note reviewed.    Past Medical History:  Diagnosis Date  . Alcoholism ([place])    recovering since 2000  . Anxiety and depression   .  Arthritis BACK  . Bipolar disorder ([place])   . Blepharospasm LEFT EYE  . CHF exacerbation ([place]) 02/24/2017  . Chronic back pain   . COPD (chronic obstructive pulmonary disease) ([place])   . Coronary artery disease CARDIOLOGIST- DR [place]--- LAST VISIT NOTE 09-07-2009  W/ CHART  . Emphysema   . History of alcohol abuse RECOVERING SINCE 2000  . History of Left renal mass 03/02/2012   underwent partial nephrectomy  . HTN (hypertension)   . Idiopathic acute facial nerve palsy LEFT SIDE--  BOTOX THERAPY  . Inferior MI ([place]) 2000--  POST PTCA W/ STENT X1  . Osteoporosis   . Other and unspecified general anesthetics causing adverse effect in therapeutic use post op delirium--  last anes record w/ chart  from   09-22-2009 (spinal w/ light sedation)  .  Peripheral vascular disease ([place]) POST RIGHT CAROTID SURG.  1995  . Renal cell carcinoma ([place]) 07/09/12   Left mass  . Rosacea LEFT FACIAL RASH  . S/P radiation therapy 02/21/2012   38.75 Gy HDR 5 Fractions- vaginal cuff  . Scoliosis   . Seizures ([place])    x 1 after abrupt discontinuation of  Clonidine  . Status post carotid endarterectomy RIGHT --  1995  . Status post primary angioplasty with coronary stent 2000--  POST INFERIOR MI  . Unstable balance WALKS W/ CANE  . Vaginal cancer ([place])      Family History  Problem Relation Age of Onset  . Hypertension Mother   . Hypertension Father      Past Surgical History:  Procedure Laterality Date  . BREAST EXCISIONAL BIOPSY Left 2019   b9 axilla Bx X 3  . CAROTID ENDARTERECTOMY  1995   RIGHT  . CATARACT EXTRACTION W/ INTRAOCULAR LENS  IMPLANT, BILATERAL    . CERVICAL CONIZATION W/BX  09-23-2008  . CORONARY ANGIOPLASTY WITH STENT PLACEMENT  2000-   INFERIOR MI   X1 STENT TO RCA  . EUS N/A 03/05/2012   Procedure: FULL UPPER ENDOSCOPIC ULTRASOUND (EUS) RADIAL and EGD;  Surgeon: Milus Banister, MD;  Location: [facility];  Service: Endoscopy;  Laterality: N/A;  ercp scope first than eus scope  . HEMIARTHROPLASTY HIP  12-26-2008   LEFT FEMORAL NECK FX  . ORIF HIP FRACTURE  02-13-2007   RIGHT FEMORAL NECK FX  . ORIF RIGHT DISTAL RADIUS AND RIGHT PROXIMAL HUMEROUS NECK FX'S  10-10-2005  . RIGHT SHOULDER SURG.  2007  . ROBOTIC ASSITED PARTIAL NEPHRECTOMY Left 07/09/2012   Procedure: ROBOTIC ASSITED PARTIAL NEPHRECTOMY;  Surgeon: Dutch Gray, MD;  Location: [facility];  Service: Urology;  Laterality: Left;  . TOTAL HIP ARTHROPLASTY  04-15-2008   POST FAILED  RIGHT HIP ORIF FEMORAL FX  . TOTAL KNEE ARTHROPLASTY  09-22-2009   RIGHT  . UPPER RIGHT VAGINAL REGION  12/28/11   BIOPSY: SQUAMOUS CELL CARCINOMA  . VAGINAL HYSTERECTOMY  07/06/2009   Secondary to dysplasia    Social History   Socioeconomic History  . Marital status: Divorced     Spouse name: Not on file  . Number of children: Not on file  . Years of education: Not on file  . Highest education level: Not on file  Occupational History  . Not on file  Tobacco Use  . Smoking status: Former Smoker    Packs/day: 2.00    Years: 50.00    Pack years: 100.00    Types: Cigarettes    Quit date: 01/18/2011    Years since quitting: 8.7  .  Smokeless tobacco: Never Used  . Tobacco comment: STATES QUIT SMOKING 01-18-2011  Vaping Use  . Vaping Use: Never used  Substance and Sexual Activity  . Alcohol use: Not Currently    Comment: RECOVERING ALCOHOLIC--   QUIT IN 2951  . Drug use: No  . Sexual activity: Never  Other Topics Concern  . Not on file  Social History Narrative  . Not on file   Social Determinants of Health   Financial Resource Strain:   . Difficulty of Paying Living Expenses: Not on file  Food Insecurity:   . Worried About Charity fundraiser in the Last Year: Not on file  . Ran Out of Food in the Last Year: Not on file  Transportation Needs:   . Lack of Transportation (Medical): Not on file  . Lack of Transportation (Non-Medical): Not on file  Physical Activity:   . Days of Exercise per Week: Not on file  . Minutes of Exercise per Session: Not on file  Stress:   . Feeling of Stress : Not on file  Social Connections:   . Frequency of Communication with Friends and Family: Not on file  . Frequency of Social Gatherings with Friends and Family: Not on file  . Attends Religious Services: Not on file  . Active Member of Clubs or Organizations: Not on file  . Attends Archivist Meetings: Not on file  . Marital Status: Not on file  Intimate Partner Violence:   . Fear of Current or Ex-Partner: Not on file  . Emotionally Abused: Not on file  . Physically Abused: Not on file  . Sexually Abused: Not on file     Allergies  Allergen Reactions  . Doxycycline Diarrhea  . Gabapentin Other (See Comments)    sleepiness     Immunization History    Administered Date(s) Administered  . Fluad Quad(high Dose 65+) 11/01/2018  . Influenza Split 01/18/2008, 11/10/2009  . Influenza, High Dose Seasonal PF 09/30/2011  . Influenza,inj,quad, With Preservative 10/17/2016  . Influenza-Unspecified 12/07/2010, 09/17/2013  . Moderna SARS-COVID-2 Vaccination 03/01/2019, 03/29/2019  . Pneumococcal-Unspecified 01/18/2008, 04/12/2010  . Tdap 04/25/2011  . Zoster 04/28/2010  . Zoster Recombinat (Shingrix) 08/06/2017    Outpatient Medications Prior to Visit  Medication Sig Dispense Refill  . albuterol (PROVENTIL HFA;VENTOLIN HFA) 108 (90 BASE) MCG/ACT inhaler Inhale 2 puffs into the lungs every 6 (six) hours as needed for wheezing.    Marland Kitchen amlodipine-atorvastatin (CADUET) 10-20 MG tablet Take 1 tablet by mouth daily. 30 tablet 6  . ARIPiprazole (ABILIFY) 20 MG tablet Take 10 mg by mouth every morning.     Marland Kitchen aspirin EC 81 MG tablet Take 81 mg by mouth every morning.     . budesonide-formoterol (SYMBICORT) 160-4.5 MCG/ACT inhaler Inhale 2 puffs into the lungs 2 (two) times daily.    . Calcium Carbonate-Vitamin D 600-400 MG-UNIT per tablet Take 1 tablet by mouth 2 (two) times daily.     . Cholecalciferol (VITAMIN D3) 2000 UNITS TABS Take 2,000 Units by mouth 2 (two) times daily.     Marland Kitchen dexlansoprazole (DEXILANT) 60 MG capsule Take 60 mg by mouth daily.    Marland Kitchen docusate sodium (COLACE) 100 MG capsule Take 100 mg by mouth as directed.    . furosemide (LASIX) 20 MG tablet Take 20 mg by mouth as directed. Take daily every 2-3 days    . hydrALAZINE (APRESOLINE) 25 MG tablet Take 1 tablet (25 mg total) by mouth 2 (two) times daily. 270 tablet  3  . lamoTRIgine (LAMICTAL) 100 MG tablet Take 200 mg by mouth 2 (two) times daily.     . montelukast (SINGULAIR) 10 MG tablet Take 1 tablet (10 mg total) by mouth at bedtime. 30 tablet 3  . Multiple Vitamin (MULTIVITAMIN) capsule Take 1 capsule by mouth every morning.     . nitroGLYCERIN (NITROSTAT) 0.4 MG SL tablet Place 1  tablet (0.4 mg total) under the tongue every 5 (five) minutes as needed for chest pain. 25 tablet 3  . oxyCODONE-acetaminophen (PERCOCET) 10-325 MG per tablet Take 1 tablet by mouth every 4 (four) hours.     . polyethylene glycol (MIRALAX / GLYCOLAX) packet Take 17 g by mouth daily as needed for mild constipation or moderate constipation.     . potassium chloride (KLOR-CON) 8 MEQ tablet Take 8 mEq by mouth every morning.     . sertraline (ZOLOFT) 50 MG tablet Take 50 mg by mouth every morning.     . Tiotropium Bromide Monohydrate (SPIRIVA RESPIMAT) 1.25 MCG/ACT AERS Inhale 2 puffs into the lungs at bedtime.    Marland Kitchen tiZANidine (ZANAFLEX) 4 MG tablet tizanidine 4 mg tablet  TAKE 1 TABLET BY MOUTH 4TIMES A DAY AS NEEDED FOR SPASMS    . valsartan (DIOVAN) 320 MG tablet TAKE 1 TABLET BY MOUTH EVERY DAY 90 tablet 2  . Fluticasone-Umeclidin-Vilant (TRELEGY ELLIPTA IN) Inhale 2 puffs into the lungs daily.     No facility-administered medications prior to visit.    Review of Systems  Constitutional: Negative.  Negative for chills, fever and weight loss.  Respiratory: Positive for shortness of breath. Negative for sputum production.   Cardiovascular: Negative for chest pain and leg swelling.  Gastrointestinal: Negative for heartburn, nausea and vomiting.  Musculoskeletal: Positive for joint pain. Negative for myalgias.  Neurological: Positive for weakness.  Endo/Heme/Allergies: Negative for environmental allergies.     Objective:   Vitals:   10/28/19 1424 10/28/19 1436  BP: 120/66   Pulse: 68 (!) 52  Temp: 97.7 F (36.5 C)   TempSrc: Temporal   SpO2: (!) 84% 93%  Weight: 182 lb 9.6 oz (82.8 kg)   Height: 5\' 3"  (1.6 m)    93% on   RA BMI Readings from Last 3 Encounters:  10/28/19 32.35 kg/m  09/02/19 31.89 kg/m  08/29/19 31.85 kg/m   Wt Readings from Last 3 Encounters:  10/28/19 182 lb 9.6 oz (82.8 kg)  09/02/19 180 lb (81.6 kg)  08/29/19 179 lb 12.8 oz (81.6 kg)    Physical  Exam Vitals reviewed.  Constitutional:      General: She is not in acute distress.    Appearance: Normal appearance. She is obese. She is not ill-appearing.  HENT:     Head: Normocephalic and atraumatic.  Eyes:     General: No scleral icterus. Cardiovascular:     Rate and Rhythm: Normal rate and regular rhythm.     Heart sounds: No murmur heard.   Pulmonary:     Comments: Mild conversational dyspnea, clear to auscultation bilaterally, distant breath sounds. Musculoskeletal:        General: No swelling or deformity.     Cervical back: Neck supple.     Comments: kyphosis  Lymphadenopathy:     Cervical: No cervical adenopathy.  Skin:    General: Skin is warm and dry.     Findings: No rash.  Neurological:     General: No focal deficit present.     Mental Status: She is alert.  Coordination: Coordination normal.  Psychiatric:        Mood and Affect: Mood normal.        Behavior: Behavior normal.      CBC    Component Value Date/Time   WBC 9.1 09/02/2019 1700   RBC 4.24 09/02/2019 1700   HGB 14.4 09/02/2019 1700   HCT 41.7 09/02/2019 1700   PLT 304.0 09/02/2019 1700   MCV 98.4 09/02/2019 1700   MCH 32.9 02/24/2017 2339   MCHC 34.5 09/02/2019 1700   RDW 14.5 09/02/2019 1700   LYMPHSABS 2.4 09/02/2019 1700   MONOABS 0.7 09/02/2019 1700   EOSABS 0.4 09/02/2019 1700   BASOSABS 0.1 09/02/2019 1700    CHEMISTRY No results for input(s): NA, K, CL, CO2, GLUCOSE, BUN, CREATININE, CALCIUM, MG, PHOS in the last 168 hours. CrCl cannot be calculated (Patient's most recent lab result is older than the maximum 21 days allowed.).   Chest Imaging- films reviewed: CXR, 2 view 02/25/2019-kyphoscoliosis.  Flattened hemidiaphragms bilaterally.  Increased right upper lobe lucency suspicious for bullous disease.  10/01/2018 LD CT chest-bilateral upper lobe panlobular emphysema, worse on the right.  Bilateral basilar cystic changes.  Mild focal right lower lobe subpleural fibrosis no  masses.  Patulous esophagus.  Significant vascular calcification.  No obvious mediastinal or hilar adenopathy.  Lung RADS 1  10/24/2019 lung cancer screening CT-severe upper lobe predominant emphysema.  Left upper lobe irregular nodule--lung RADS 4A  Pulmonary Functions Testing Results: PFT Results Latest Ref Rng & Units 07/02/2019  FVC-Pre L 2.74  FVC-Predicted Pre % 100  FVC-Post L 2.71  FVC-Predicted Post % 99  Pre FEV1/FVC % % 68  Post FEV1/FCV % % 69  FEV1-Pre L 1.86  FEV1-Predicted Pre % 91  FEV1-Post L 1.87  DLCO uncorrected ml/min/mmHg 9.87  DLCO UNC% % 53  DLCO corrected ml/min/mmHg 9.87  DLCO COR %Predicted % 53  DLVA Predicted % 55  TLC L 4.52  TLC % Predicted % 92  RV % Predicted % 84   2021- no significant obstruction (per ATS criteria) or restriction.  Moderately reduced diffusion capacity.  Flow volume loop suggests obstruction.   Echocardiogram 03/20/2019: LVEF 60 to 28%, grade 1 diastolic dysfunction.  Severely dilated LA, normal RV.  Mildly elevated PASP.  Normal RA.  Mild MR and TR.      Assessment & Plan:     ICD-10-CM   1. Lung nodule, solitary  R91.1   2. Former smoker  Z87.891   3. Pulmonary emphysema, unspecified emphysema type ([place])  J43.9     Dyspnea on exertion likely due to chronic hypoxic respiratory failure and emphysema -Walked in the office today.  Requires 2 L supplemental oxygen with activity.  Desaturations are likely what has precipitated her worsening shortness of breath over time. -Continue triple inhaled therapy.  Rinse her mouth after using Symbicort. -Given her concern for correct inhaler technique, will recommend starting DuoNebs before using long-acting inhalers to promote correct use with an appropriate breath-hold.   Pulmonary nodules, former smoker. -Discussed with Dr. Valeta Harms, who recommends 25-month follow-up super D CT scan.  He thinks it is likely that she will need lung biopsies at this time, but at this point it is not as  likely that a biopsy would be diagnostic at exposes her to procedural risk.   RTC in 3 months with Dr. Valeta Harms.    Current Outpatient Medications:  .  albuterol (PROVENTIL HFA;VENTOLIN HFA) 108 (90 BASE) MCG/ACT inhaler, Inhale 2 puffs into the lungs  every 6 (six) hours as needed for wheezing., Disp: , Rfl:  .  amlodipine-atorvastatin (CADUET) 10-20 MG tablet, Take 1 tablet by mouth daily., Disp: 30 tablet, Rfl: 6 .  ARIPiprazole (ABILIFY) 20 MG tablet, Take 10 mg by mouth every morning. , Disp: , Rfl:  .  aspirin EC 81 MG tablet, Take 81 mg by mouth every morning. , Disp: , Rfl:  .  budesonide-formoterol (SYMBICORT) 160-4.5 MCG/ACT inhaler, Inhale 2 puffs into the lungs 2 (two) times daily., Disp: , Rfl:  .  Calcium Carbonate-Vitamin D 600-400 MG-UNIT per tablet, Take 1 tablet by mouth 2 (two) times daily. , Disp: , Rfl:  .  Cholecalciferol (VITAMIN D3) 2000 UNITS TABS, Take 2,000 Units by mouth 2 (two) times daily. , Disp: , Rfl:  .  dexlansoprazole (DEXILANT) 60 MG capsule, Take 60 mg by mouth daily., Disp: , Rfl:  .  docusate sodium (COLACE) 100 MG capsule, Take 100 mg by mouth as directed., Disp: , Rfl:  .  furosemide (LASIX) 20 MG tablet, Take 20 mg by mouth as directed. Take daily every 2-3 days, Disp: , Rfl:  .  hydrALAZINE (APRESOLINE) 25 MG tablet, Take 1 tablet (25 mg total) by mouth 2 (two) times daily., Disp: 270 tablet, Rfl: 3 .  lamoTRIgine (LAMICTAL) 100 MG tablet, Take 200 mg by mouth 2 (two) times daily. , Disp: , Rfl:  .  montelukast (SINGULAIR) 10 MG tablet, Take 1 tablet (10 mg total) by mouth at bedtime., Disp: 30 tablet, Rfl: 3 .  Multiple Vitamin (MULTIVITAMIN) capsule, Take 1 capsule by mouth every morning. , Disp: , Rfl:  .  nitroGLYCERIN (NITROSTAT) 0.4 MG SL tablet, Place 1 tablet (0.4 mg total) under the tongue every 5 (five) minutes as needed for chest pain., Disp: 25 tablet, Rfl: 3 .  oxyCODONE-acetaminophen (PERCOCET) 10-325 MG per tablet, Take 1 tablet by mouth  every 4 (four) hours. , Disp: , Rfl:  .  polyethylene glycol (MIRALAX / GLYCOLAX) packet, Take 17 g by mouth daily as needed for mild constipation or moderate constipation. , Disp: , Rfl:  .  potassium chloride (KLOR-CON) 8 MEQ tablet, Take 8 mEq by mouth every morning. , Disp: , Rfl:  .  sertraline (ZOLOFT) 50 MG tablet, Take 50 mg by mouth every morning. , Disp: , Rfl:  .  Tiotropium Bromide Monohydrate (SPIRIVA RESPIMAT) 1.25 MCG/ACT AERS, Inhale 2 puffs into the lungs at bedtime., Disp: , Rfl:  .  tiZANidine (ZANAFLEX) 4 MG tablet, tizanidine 4 mg tablet  TAKE 1 TABLET BY MOUTH 4TIMES A DAY AS NEEDED FOR SPASMS, Disp: , Rfl:  .  valsartan (DIOVAN) 320 MG tablet, TAKE 1 TABLET BY MOUTH EVERY DAY, Disp: 90 tablet, Rfl: 2     Julian Hy, DO [facility] 10/28/2019 2:42 PM

## 2019-10-28 NOTE — Patient Instructions (Addendum)
Thank you for visiting Dr. Carlis Abbott at [facility]. We recommend the following: Orders Placed This Encounter  Procedures   Ambulatory Referral for DME   Orders Placed This Encounter  Procedures   Ambulatory Referral for DME    Referral Priority:   Routine    Referral Type:   Durable Medical Equipment Purchase    Number of Visits Requested:   1   Keep taking Spiriva once daily and Symbicort twice daily. Use you new nebulizers before your long-acting inhalers.   Meds ordered this encounter  Medications   ipratropium-albuterol (DUONEB) 0.5-2.5 (3) MG/3ML SOLN    Sig: Take 3 mLs by nebulization in the morning and at bedtime.    Dispense:  360 mL    Refill:  11    No follow-ups on file. with Dr. Erin Fulling (30 minutes visit).    Please do your part to reduce the spread of COVID-19.

## 2019-10-31 NOTE — Progress Notes (Signed)
I have called the patient with the results of her low dose CT. Her scan was read as a Lung RADS 4 A : suspicious findings, either short term follow up in 3 months or alternatively  PET Scan evaluation may be considered when there is a solid component of  8 mm or larger. I explained that we will do a follow up LDCT in 3 months to re-evaluate the nodule for stability ,growth or resolution. I explained that someone will call her closer to the time to get this scheduled. It will be in January 2022. She verbalized understanding and had no further questions at completion of the call.  Denise, please place order for follow up LDCT, in 3 months and fax results to PCP. Thanks so much

## 2019-11-04 ENCOUNTER — Telehealth: Payer: Self-pay | Admitting: Critical Care Medicine

## 2019-11-04 ENCOUNTER — Other Ambulatory Visit: Payer: Self-pay | Admitting: *Deleted

## 2019-11-04 DIAGNOSIS — Z87891 Personal history of nicotine dependence: Secondary | ICD-10-CM

## 2019-11-04 NOTE — Telephone Encounter (Signed)
Called and spoke with [name].  [name] stated she is using O2 at 2 liters as needed.  [name] stated she wanted to know if she would have to continue using O2.  [name] stated she has been checking her O2 and at times she is 82-83% on RA.  Advised [name] to wear her O2 when doing any activity, and continue to monitor O2 sats. [name] stated she is using her nebs, symbicort, spiriva, and albuterol inhaler.  [name] is concerned she may have to be on O2 permanently.  [name] AVS with [name] instructions to follow up with Dr Erin Fulling in 3 months, but [name] stated Dr Carlis Abbott told her to follow up with Dr Valeta Harms, because of her lung nodules, and possible future biopsy. [name] scheduled 01/02/20 at 1330, with Dr Valeta Harms. Reviewed Dr Ainsley Spinner last OV instructions. Nothing further at this time.  LOV 10/28/19- Dyspnea on exertion likely due to chronic hypoxic respiratory failure and emphysema -Walked in the office today.  Requires 2 L supplemental oxygen with activity.  Desaturations are likely what has precipitated her worsening shortness of breath over time. -Continue triple inhaled therapy.  Rinse her mouth after using Symbicort. -Given her concern for correct inhaler technique, will recommend starting DuoNebs before using long-acting inhalers to promote correct use with an appropriate breath-hold.  Pulmonary nodules, former smoker. -Discussed with Dr. Valeta Harms, who recommends 39-month follow-up super D CT scan.  He thinks it is likely that she will need lung biopsies at this time, but at this point it is not as likely that a biopsy would be diagnostic at exposes her to procedural risk.   RTC in 3 months with Dr. Valeta Harms.

## 2019-11-06 ENCOUNTER — Other Ambulatory Visit: Payer: Self-pay

## 2019-11-06 ENCOUNTER — Emergency Department (HOSPITAL_COMMUNITY)
Admission: EM | Admit: 2019-11-06 | Discharge: 2019-11-06 | Disposition: A | Discharge status: Home / Self Care | Payer: Medicare Other | Attending: Emergency Medicine | Admitting: Emergency Medicine

## 2019-11-06 ENCOUNTER — Encounter (HOSPITAL_COMMUNITY): Payer: Self-pay

## 2019-11-06 DIAGNOSIS — Z79899 Other long term (current) drug therapy: Secondary | ICD-10-CM | POA: Diagnosis not present

## 2019-11-06 DIAGNOSIS — K625 Hemorrhage of anus and rectum: Secondary | ICD-10-CM

## 2019-11-06 DIAGNOSIS — Z7982 Long term (current) use of aspirin: Secondary | ICD-10-CM | POA: Diagnosis not present

## 2019-11-06 DIAGNOSIS — Z96651 Presence of right artificial knee joint: Secondary | ICD-10-CM | POA: Diagnosis not present

## 2019-11-06 DIAGNOSIS — I5032 Chronic diastolic (congestive) heart failure: Secondary | ICD-10-CM | POA: Insufficient documentation

## 2019-11-06 DIAGNOSIS — Z96641 Presence of right artificial hip joint: Secondary | ICD-10-CM | POA: Insufficient documentation

## 2019-11-06 DIAGNOSIS — I110 Hypertensive heart disease with heart failure: Secondary | ICD-10-CM | POA: Diagnosis not present

## 2019-11-06 DIAGNOSIS — J449 Chronic obstructive pulmonary disease, unspecified: Secondary | ICD-10-CM | POA: Diagnosis not present

## 2019-11-06 DIAGNOSIS — Z87891 Personal history of nicotine dependence: Secondary | ICD-10-CM | POA: Diagnosis not present

## 2019-11-06 LAB — COMPREHENSIVE METABOLIC PANEL
ALT: 19 U/L (ref 0–44)
AST: 22 U/L (ref 15–41)
Albumin: 3.7 g/dL (ref 3.5–5.0)
Alkaline Phosphatase: 73 U/L (ref 38–126)
Anion gap: 11 (ref 5–15)
BUN: 23 mg/dL (ref 8–23)
CO2: 19 mmol/L — ABNORMAL LOW (ref 22–32)
Calcium: 9.6 mg/dL (ref 8.9–10.3)
Chloride: 108 mmol/L (ref 98–111)
Creatinine, Ser: 0.89 mg/dL (ref 0.44–1.00)
GFR, Estimated: >60 mL/min (ref >60)
Glucose, Bld: 103 mg/dL — ABNORMAL HIGH (ref 70–99)
Potassium: 4.8 mmol/L (ref 3.5–5.1)
Sodium: 138 mmol/L (ref 135–145)
Total Bilirubin: 0.7 mg/dL (ref 0.3–1.2)
Total Protein: 6.4 g/dL — ABNORMAL LOW (ref 6.5–8.1)

## 2019-11-06 LAB — CBC
HCT: 34.3 % — ABNORMAL LOW (ref 36.0–46.0)
Hemoglobin: 11.8 g/dL — ABNORMAL LOW (ref 12.0–15.0)
MCH: 32.7 pg (ref 26.0–34.0)
MCHC: 34.4 g/dL (ref 30.0–36.0)
MCV: 95 fL (ref 80.0–100.0)
Platelets: 264 10*3/uL (ref 150–400)
RBC: 3.61 MIL/uL — ABNORMAL LOW (ref 3.87–5.11)
RDW: 13.8 % (ref 11.5–15.5)
WBC: 8.5 10*3/uL (ref 4.0–10.5)
nRBC: 0 % (ref 0.0–0.2)

## 2019-11-06 LAB — TYPE AND SCREEN
ABO/RH(D): B POS
Antibody Screen: NEGATIVE

## 2019-11-06 LAB — POC OCCULT BLOOD, ED: Fecal Occult Bld: POSITIVE — AB

## 2019-11-06 NOTE — ED Notes (Signed)
Discharge paperwork reviewed with pt.  Pt with no questions or concerns at this time, ambulatory with rolling walker to ED exit.

## 2019-11-06 NOTE — ED Notes (Signed)
Family at bedside. 

## 2019-11-06 NOTE — Discharge Instructions (Addendum)
As discussed, your evaluation today has been largely reassuring.  But, it is important that you monitor your condition carefully, and do not hesitate to return to the ED if you develop new, or concerning changes in your condition.  You have been diagnosed with rectal bleeding, which is likely due to diverticulosis  Otherwise, please follow-up with your physician for appropriate ongoing care.  Dr. Henrene Pastor is your GI doctor.

## 2019-11-06 NOTE — ED Triage Notes (Signed)
Pt presents with c/o rectal bleeding since yesterday. Pt reports her stool is dark in nature. Pt denies any pain at this time.

## 2019-11-06 NOTE — ED Provider Notes (Signed)
[facility] DEPT Provider Note   CSN: [id number] Arrival date & time: 11/06/19  1343     History Chief Complaint  Patient presents with  . Rectal Bleeding    [name] is a 74 y.o. female.  HPI   Patient with multiple medical issues presents with concern of rectal bleeding. She notes that since yesterday she has had several episodes of blackish stool. She had one episode yesterday, and several today.  Episodes occur with defecation, and there is typically blood visible after loose bowel movements. Patient denies other new complaints including abdominal pain, chest pain, fever, syncope, dyspnea. No recent change in medication, diet, activity. She has however, recently started using supplemental oxygen.  She has a history of COPD, is a former smoker. No recent colonoscopy.  Past Medical History:  Diagnosis Date  . Alcoholism ([place])    recovering since 2000  . Anxiety and depression   . Arthritis BACK  . Bipolar disorder ([place])   . Blepharospasm LEFT EYE  . CHF exacerbation ([place]) 02/24/2017  . Chronic back pain   . COPD (chronic obstructive pulmonary disease) ([place])   . Coronary artery disease CARDIOLOGIST- DR Martinique--- LAST VISIT NOTE 09-07-2009  W/ CHART  . Emphysema   . History of alcohol abuse RECOVERING SINCE 2000  . History of Left renal mass 03/02/2012   underwent partial nephrectomy  . HTN (hypertension)   . Idiopathic acute facial nerve palsy LEFT SIDE--  BOTOX THERAPY  . Inferior MI ([place]) 2000--  POST PTCA W/ STENT X1  . Osteoporosis   . Other and unspecified general anesthetics causing adverse effect in therapeutic use post op delirium--  last anes record w/ chart  from   09-22-2009 (spinal w/ light sedation)  . Peripheral vascular disease ([place]) POST RIGHT CAROTID SURG.  1995  . Renal cell carcinoma ([place]) 07/09/12   Left mass  . Rosacea LEFT FACIAL RASH  . S/P radiation therapy 02/21/2012   38.75 Gy HDR 5 Fractions- vaginal  cuff  . Scoliosis   . Seizures ([place])    x 1 after abrupt discontinuation of  Clonidine  . Status post carotid endarterectomy RIGHT --  1995  . Status post primary angioplasty with coronary stent 2000--  POST INFERIOR MI  . Unstable balance WALKS W/ CANE  . Vaginal cancer [facility])     Patient Active Problem List   Diagnosis Date Noted  . GERD (gastroesophageal reflux disease) 07/02/2019  . Chronic diastolic CHF (congestive heart failure) ([place]) 03/07/2019  . Atherosclerosis of native artery of both lower extremities with intermittent claudication ([place]) 03/30/2017  . CHF exacerbation ([place]) 02/24/2017  . Chest pain 02/24/2017  . Acute congestive heart failure ([place]) 02/24/2017  . Preoperative clearance 06/13/2012  . Unspecified gastritis and gastroduodenitis without mention of hemorrhage 03/05/2012  . Hypoxia 03/04/2012  . Former smoker 03/04/2012  . COPD (chronic obstructive pulmonary disease) ([place]) 03/04/2012  . Abdominal pain, acute, epigastric 03/02/2012  . Abnormal abdominal CT scan 03/02/2012  . Hypertension   . Depression   . Alcoholism ([place])   . Bipolar disorder ([place])   . Status post carotid endarterectomy   . Arthritis   . Idiopathic acute facial nerve palsy   . Blepharospasm   . Anxiety   . Vaginal cancer ([place])   . Carotid artery disease ([place]) 06/28/2011  . Coronary artery disease   . Inferior MI ([place])   . Peripheral vascular disease ([place])   . HTN (hypertension)   . Dysplasia of  vagina, histologically confirmed 12/14/2010    Past Surgical History:  Procedure Laterality Date  . BREAST EXCISIONAL BIOPSY Left 2019   b9 axilla Bx X 3  . CAROTID ENDARTERECTOMY  1995   RIGHT  . CATARACT EXTRACTION W/ INTRAOCULAR LENS  IMPLANT, BILATERAL    . CERVICAL CONIZATION W/BX  09-23-2008  . CORONARY ANGIOPLASTY WITH STENT PLACEMENT  2000-   INFERIOR MI   X1 STENT TO RCA  . EUS N/A 03/05/2012   Procedure: FULL UPPER ENDOSCOPIC ULTRASOUND (EUS) RADIAL and EGD;  Surgeon: Milus Banister, MD;  Location: [facility] ENDOSCOPY;  Service: Endoscopy;  Laterality: N/A;  ercp scope first than eus scope  . HEMIARTHROPLASTY HIP  12-26-2008   LEFT FEMORAL NECK FX  . ORIF HIP FRACTURE  02-13-2007   RIGHT FEMORAL NECK FX  . ORIF RIGHT DISTAL RADIUS AND RIGHT PROXIMAL HUMEROUS NECK FX'S  10-10-2005  . RIGHT SHOULDER SURG.  2007  . ROBOTIC ASSITED PARTIAL NEPHRECTOMY Left 07/09/2012   Procedure: ROBOTIC ASSITED PARTIAL NEPHRECTOMY;  Surgeon: Dutch Gray, MD;  Location: [facility] ORS;  Service: Urology;  Laterality: Left;  . TOTAL HIP ARTHROPLASTY  04-15-2008   POST FAILED  RIGHT HIP ORIF FEMORAL FX  . TOTAL KNEE ARTHROPLASTY  09-22-2009   RIGHT  . UPPER RIGHT VAGINAL REGION  12/28/11   BIOPSY: SQUAMOUS CELL CARCINOMA  . VAGINAL HYSTERECTOMY  07/06/2009   Secondary to dysplasia     OB History   No obstetric history on file.     Family History  Problem Relation Age of Onset  . Hypertension Mother   . Hypertension Father     Social History   Tobacco Use  . Smoking status: Former Smoker    Packs/day: 2.00    Years: 50.00    Pack years: 100.00    Types: Cigarettes    Quit date: 01/18/2011    Years since quitting: 8.8  . Smokeless tobacco: Never Used  . Tobacco comment: STATES QUIT SMOKING 01-18-2011  Vaping Use  . Vaping Use: Never used  Substance Use Topics  . Alcohol use: Not Currently    Comment: RECOVERING ALCOHOLIC--   QUIT IN 1751  . Drug use: No    Home Medications Prior to Admission medications   Medication Sig Start Date End Date Taking? Authorizing Provider  albuterol (PROVENTIL HFA;VENTOLIN HFA) 108 (90 BASE) MCG/ACT inhaler Inhale 2 puffs into the lungs every 6 (six) hours as needed for wheezing.   Yes [provider]  amlodipine-atorvastatin (CADUET) 10-20 MG tablet Take 1 tablet by mouth daily. 07/25/19  Yes Alvstad, Kristin L, RPH-CPP  ARIPiprazole (ABILIFY) 20 MG tablet Take 10 mg by mouth daily.    Yes [provider]  aspirin EC 81 MG tablet  Take 81 mg by mouth every morning.    Yes [provider]  budesonide-formoterol (SYMBICORT) 160-4.5 MCG/ACT inhaler Inhale 2 puffs into the lungs 2 (two) times daily.   Yes [provider]  celecoxib (CELEBREX) 200 MG capsule Take 200 mg by mouth daily as needed for mild pain.    Yes [provider]  Cholecalciferol (VITAMIN D3) 2000 UNITS TABS Take 2,000 Units by mouth daily.    Yes [provider]  dexlansoprazole (DEXILANT) 60 MG capsule Take 60 mg by mouth daily.   Yes [provider]  furosemide (LASIX) 20 MG tablet Take 20 mg by mouth daily as needed for fluid.    Yes [provider]  hydrALAZINE (APRESOLINE) 25 MG tablet Take 1  tablet (25 mg total) by mouth 2 (two) times daily. 06/28/19 11/06/19 Yes Martinique, Peter M, MD  ipratropium-albuterol (DUONEB) 0.5-2.5 (3) MG/3ML SOLN Take 3 mLs by nebulization in the morning and at bedtime. 10/28/19  Yes Julian Hy, DO  lamoTRIgine (LAMICTAL) 100 MG tablet Take 200 mg by mouth 2 (two) times daily.    Yes [provider]  montelukast (SINGULAIR) 10 MG tablet Take 1 tablet (10 mg total) by mouth at bedtime. 09/03/19  Yes Martyn Ehrich, NP  Multiple Vitamin (MULTIVITAMIN) capsule Take 1 capsule by mouth daily.    Yes [provider]  nitroGLYCERIN (NITROSTAT) 0.4 MG SL tablet Place 1 tablet (0.4 mg total) under the tongue every 5 (five) minutes as needed for chest pain. 02/28/19  Yes Kroeger, Lorelee Cover., PA-C  oxyCODONE-acetaminophen (PERCOCET) 10-325 MG per tablet Take 1 tablet by mouth every 4 (four) hours as needed for pain.    Yes [provider]  polyethylene glycol (MIRALAX / GLYCOLAX) packet Take 17 g by mouth daily as needed for mild constipation or moderate constipation.    Yes [provider]  potassium chloride (KLOR-CON) 8 MEQ tablet Take 8 mEq by mouth daily.    Yes [provider]  Propylene Glycol (SYSTANE BALANCE OP) Place 1 drop into  both eyes daily as needed (dry eyes).   Yes [provider]  sertraline (ZOLOFT) 50 MG tablet Take 50 mg by mouth daily.    Yes [provider]  Tiotropium Bromide Monohydrate (SPIRIVA RESPIMAT) 1.25 MCG/ACT AERS Inhale 2 puffs into the lungs at bedtime.   Yes [provider]  tiZANidine (ZANAFLEX) 4 MG tablet Take 4 mg by mouth 4 (four) times daily as needed for muscle spasms.    Yes [provider]  valsartan (DIOVAN) 320 MG tablet TAKE 1 TABLET BY MOUTH EVERY DAY Patient taking differently: Take 320 mg by mouth daily.  08/06/19  Yes Martinique, Peter M, MD  docusate sodium (COLACE) 100 MG capsule Take 100 mg by mouth as directed. Patient not taking: Reported on 11/06/2019    [provider]    Allergies    Doxycycline and Gabapentin  Review of Systems   Review of Systems  Constitutional:       Per HPI, otherwise negative  HENT:       Per HPI, otherwise negative  Respiratory:       Per HPI, otherwise negative  Cardiovascular:       Per HPI, otherwise negative  Gastrointestinal: Positive for blood in stool. Negative for abdominal pain and vomiting.  Endocrine:       Negative aside from HPI  Genitourinary:       Neg aside from HPI   Musculoskeletal:       Per HPI, otherwise negative  Skin: Negative.   Allergic/Immunologic: Negative for immunocompromised state.  Neurological: Negative for syncope, weakness and light-headedness.    Physical Exam Updated Vital Signs BP 120/64   Pulse 64   Temp 98.2 F (36.8 C) (Oral)   Resp 15   Ht 5\' 3"  (1.6 m)   Wt 80.7 kg   SpO2 100%   BMI 31.53 kg/m   Physical Exam Vitals and nursing note reviewed. Exam conducted with a chaperone present.  Constitutional:      General: She is not in acute distress.    Appearance: She is well-developed.  HENT:     Head: Normocephalic and atraumatic.  Eyes:     Conjunctiva/sclera: Conjunctivae normal.  Cardiovascular:  Rate and Rhythm: Normal rate and  regular rhythm.  Pulmonary:     Effort: Pulmonary effort is normal. No respiratory distress.     Breath sounds: Normal breath sounds. No stridor.  Abdominal:     General: There is no distension.  Genitourinary:    Exam position: Lithotomy position.     Rectum: Guaiac result positive. No mass, tenderness, anal fissure or external hemorrhoid. Normal anal tone.  Skin:    General: Skin is warm and dry.  Neurological:     Mental Status: She is alert and oriented to person, place, and time.     Cranial Nerves: No cranial nerve deficit.      ED Results / Procedures / Treatments   Labs (all labs ordered are listed, but only abnormal results are displayed) Labs Reviewed  COMPREHENSIVE METABOLIC PANEL - Abnormal; Notable for the following components:      Result Value   CO2 19 (*)    Glucose, Bld 103 (*)    Total Protein 6.4 (*)    All other components within normal limits  CBC - Abnormal; Notable for the following components:   RBC 3.61 (*)    Hemoglobin 11.8 (*)    HCT 34.3 (*)    All other components within normal limits  POC OCCULT BLOOD, ED - Abnormal; Notable for the following components:   Fecal Occult Bld POSITIVE (*)    All other components within normal limits  TYPE AND SCREEN    Procedures Procedures (including critical care time)  Medications Ordered in ED Medications - No data to display  ED Course  I have reviewed the triage vital signs and the nursing notes.  Pertinent labs & imaging results that were available during my care of the patient were reviewed by me and considered in my medical decision making (see chart for details).     Chart review after initial evaluation notable for ongoing studies of pulmonary nodule. 4:20 PM Patient in no distress, continues to deny complaints.  She, her friend, myself and the nurse discussed today's evaluation including fecal occult positive result, concern for likely diverticulosis given the absence of other new  complaints.  Patient is awake, alert, hemodynamically unremarkable, has no abdominal pain, though her hemoglobin has diminished since her most recent values, she has had bleeding episodes. Patient appropriate for, amenable to outpatient follow-up with her gastroenterologist.  MDM Rules/Calculators/A&P MDM Number of Diagnoses or Management Options Rectal bleeding: new, needed workup   Amount and/or Complexity of Data Reviewed Clinical lab tests: reviewed Tests in the medicine section of CPT: reviewed Decide to obtain previous medical records or to obtain history from someone other than the patient: yes Obtain history from someone other than the patient: yes Review and summarize past medical records: yes  Risk of Complications, Morbidity, and/or Mortality Presenting problems: high Diagnostic procedures: high Management options: high  Critical Care Total time providing critical care: < 30 minutes  Patient Progress Patient progress: stable  Final Clinical Impression(s) / ED Diagnoses Final diagnoses:  Rectal bleeding     Carmin Muskrat, MD 11/06/19 1623

## 2019-11-06 NOTE — ED Notes (Signed)
Pt placed on 2L of O2 via [place] as she was 86% on RA upon arrival.

## 2019-11-07 ENCOUNTER — Ambulatory Visit (HOSPITAL_COMMUNITY): Payer: Medicare Other

## 2019-11-07 ENCOUNTER — Ambulatory Visit: Payer: Medicare Other | Admitting: Vascular Surgery

## 2019-11-11 ENCOUNTER — Other Ambulatory Visit: Payer: Self-pay | Admitting: *Deleted

## 2019-11-11 DIAGNOSIS — I6521 Occlusion and stenosis of right carotid artery: Secondary | ICD-10-CM

## 2019-11-11 DIAGNOSIS — I6523 Occlusion and stenosis of bilateral carotid arteries: Secondary | ICD-10-CM

## 2019-11-12 NOTE — Progress Notes (Signed)
11/12/2019 [name] [id number] March 07, 1945   CHIEF COMPLAINT: Rectal bleeding   HISTORY OF PRESENT ILLNESS:  [name] is a 74 year old female with a past medical history of arthritis, anxiety, depression, bipolar disorder, alcoholism abstinent since 2000, coronary artery disease MI s/p stent to RCA in 2000, CHF, carotid artery disease s/p right carotid endarterectomy 1995 with progression of right carotid stenosis followed by Dr. 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  She presented to [facility] ED on 11/06/2019 with rectal bleeding. She reported passing a moderate amount of bright red blood in the commode x 5 episodes, 2 of these episodes she passed loose brown stool. No associated abdominal pain. Labs in the ED showed Hg 11.8 (baseline Hg 14.4 on 09/02/2019). FOBT positive. Abdominal CT/imaging was not done. She was hemodynamically stable and asymptomatic. She was discharged home with the instructions to schedule a follow up appointment in our office.   She presents to our office today accompanied by her friend and POA,  [name].  She denies having any further rectal bleeding since she presented to the ED on 11/06/2019.  No abdominal pain.  She is passing a normal formed brown bowel movement most days.  No further loose brown stools.  She takes aspirin 81 mg once daily.  No other NSAIDs.  No blood thinners.  No prior history of GI bleed.  She denies having any dysphagia or heartburn.  She underwent a colonoscopy by Dr. Sharlett Iles 09/03/2008 which showed a tortuous colon, severe diverticulosis in the sigmoid to descending colon (severe  complex tics very difficult exam), a sessile hyperplastic polyp was removed from the sigmoid colon and multiple hyperplastic polyps were removed from the rectum.  The bowel prep was poor. The patient was contacted by our office 10/03/2018 to schedule colonoscopy but she declined to do so at that time.  She elected to follow-up with her PCP to have a Cologuard or annual FOBT testing.  Of note, she was seen by Dr. Scarlette Shorts while she was admitted to the hospital 03/03/2012 due to having epigastric pain.  On admission, an abdominal/pelvic CT showed a possible dilatation to the common bile duct.  An MRCP was done which showed normal gallbladder without stones in the common bile duct measured at 13 mm with abrupt tapering just above the ampulla.  Identified her left renal lesion which led to the diagnosis of a renal cell carcinoma.  An EUS was done by Dr. Ardis Hughs which showed a dilated common bile duct without any filling defects.  No pancreatic mass or signs of cranial chronic pancreatitis were noted.   CBC Latest Ref Rng & Units 11/06/2019 09/02/2019 02/24/2017  WBC 4.0 - 10.5 K/uL 8.5 9.1 5.9  Hemoglobin 12.0 - 15.0 g/dL 11.8(L) 14.4 15.2(H)  Hematocrit 36 - 46 % 34.3(L) 41.7 42.3  Platelets 150 - 400 K/uL 264 304.0 295    CMP Latest Ref Rng & Units 11/06/2019 03/06/2019 02/25/2017  Glucose 70 - 99 mg/dL 103(H) 92 107(H)  BUN 8 - 23 mg/dL 23 25 7   Creatinine 0.44 - 1.00 mg/dL 0.89 0.90 0.88  Sodium 135 - 145 mmol/L 138 139 135  Potassium 3.5 -  5.1 mmol/L 4.8 4.6 3.9  Chloride 98 - 111 mmol/L 108 102 100(L)  CO2 22 - 32 mmol/L 19(L) 22 23  Calcium 8.9 - 10.3 mg/dL 9.6 10.4(H) 9.7  Total Protein 6.5 - 8.1 g/dL 6.4(L) - -  Total Bilirubin 0.3 - 1.2 mg/dL 0.7 - -  Alkaline Phos 38 - 126 U/L 73 - -  AST 15 - 41 U/L 22 - -  ALT 0 - 44 U/L 19 - -   ECHO 03/20/2019: 1. Left ventricular ejection fraction, by estimation, is 60 to 65%. The left ventricle has normal function. The left ventricle has no regional  wall motion abnormalities. Left ventricular diastolic parameters are consistent with Grade I diastolic dysfunction (impaired relaxation). The average left ventricular global longitudinal strain is -19.4 %. 2. Right ventricular systolic function is normal. The right ventricular size is normal. There is mildly elevated pulmonary artery systolic pressure.   Past Medical History:  Diagnosis Date  . Alcoholism ([place])    recovering since 2000  . Anxiety and depression   . Arthritis BACK  . Bipolar disorder ([place])   . Blepharospasm LEFT EYE  . CHF exacerbation ([place]) 02/24/2017  . Chronic back pain   . COPD (chronic obstructive pulmonary disease) ([place])   . Coronary artery disease CARDIOLOGIST- DR [place]--- LAST VISIT NOTE 09-07-2009  W/ CHART  . Emphysema   . History of alcohol abuse RECOVERING SINCE 2000  . History of Left renal mass 03/02/2012   underwent partial nephrectomy  . HTN (hypertension)   . Idiopathic acute facial nerve palsy LEFT SIDE--  BOTOX THERAPY  . Inferior MI ([place]) 2000--  POST PTCA W/ STENT X1  . Osteoporosis   . Other and unspecified general anesthetics causing adverse effect in therapeutic use post op delirium--  last anes record w/ chart  from   09-22-2009 (spinal w/ light sedation)  . Peripheral vascular disease ([place]) POST RIGHT CAROTID SURG.  1995  . Renal cell carcinoma ([place]) 07/09/12   Left mass  . Rosacea LEFT FACIAL RASH  . S/P radiation therapy 02/21/2012   38.75 Gy HDR 5 Fractions- vaginal cuff  . Scoliosis   . Seizures ([place])    x 1 after abrupt discontinuation of  Clonidine  . Status post carotid endarterectomy RIGHT --  1995  . Status post primary angioplasty with coronary stent 2000--  POST INFERIOR MI  . Unstable balance WALKS W/ CANE  . Vaginal cancer [facility])    Past Surgical History:  Procedure Laterality Date  . BREAST EXCISIONAL BIOPSY Left 2019   b9 axilla Bx X 3  . CAROTID ENDARTERECTOMY  1995   RIGHT  . CATARACT EXTRACTION W/ INTRAOCULAR LENS   IMPLANT, BILATERAL    . CERVICAL CONIZATION W/BX  09-23-2008  . CORONARY ANGIOPLASTY WITH STENT PLACEMENT  2000-   INFERIOR MI   X1 STENT TO RCA  . EUS N/A 03/05/2012   Procedure: FULL UPPER ENDOSCOPIC ULTRASOUND (EUS) RADIAL and EGD;  Surgeon: Milus Banister, MD;  Location: [facility] ENDOSCOPY;  Service: Endoscopy;  Laterality: N/A;  ercp scope first than eus scope  . HEMIARTHROPLASTY HIP  12-26-2008   LEFT FEMORAL NECK FX  . ORIF HIP FRACTURE  02-13-2007   RIGHT FEMORAL NECK FX  . ORIF RIGHT DISTAL RADIUS AND RIGHT PROXIMAL HUMEROUS NECK FX'S  10-10-2005  . RIGHT SHOULDER SURG.  2007  . ROBOTIC ASSITED PARTIAL NEPHRECTOMY Left 07/09/2012   Procedure: ROBOTIC ASSITED PARTIAL NEPHRECTOMY;  Surgeon: Dutch Gray, MD;  Location: [facility] ORS;  Service: Urology;  Laterality: Left;  . TOTAL HIP ARTHROPLASTY  04-15-2008   POST FAILED  RIGHT HIP ORIF FEMORAL FX  . TOTAL KNEE ARTHROPLASTY  09-22-2009   RIGHT  . UPPER RIGHT VAGINAL REGION  12/28/11   BIOPSY: SQUAMOUS CELL CARCINOMA  . VAGINAL HYSTERECTOMY  07/06/2009   Secondary to dysplasia    Social History: She is retired.  She has no children.  She quit smoking cigarettes 8 years ago.  No alcohol is 2000.  History of drug use.  Family History: Father and mother had hypertension.  No family history of esophageal, gastric or colon cancer.   Allergies  Allergen Reactions  . Doxycycline Diarrhea  . Gabapentin Other (See Comments)    sleepiness      Outpatient Encounter Medications as of 11/13/2019  Medication Sig  . albuterol (PROVENTIL HFA;VENTOLIN HFA) 108 (90 BASE) MCG/ACT inhaler Inhale 2 puffs into the lungs every 6 (six) hours as needed for wheezing.  Marland Kitchen amlodipine-atorvastatin (CADUET) 10-20 MG tablet Take 1 tablet by mouth daily.  . ARIPiprazole (ABILIFY) 20 MG tablet Take 10 mg by mouth daily.   Marland Kitchen aspirin EC 81 MG tablet Take 81 mg by mouth every morning.   . budesonide-formoterol (SYMBICORT) 160-4.5 MCG/ACT inhaler Inhale 2 puffs into the  lungs 2 (two) times daily.  . celecoxib (CELEBREX) 200 MG capsule Take 200 mg by mouth daily as needed for mild pain.   . Cholecalciferol (VITAMIN D3) 2000 UNITS TABS Take 2,000 Units by mouth daily.   Marland Kitchen dexlansoprazole (DEXILANT) 60 MG capsule Take 60 mg by mouth daily.  Marland Kitchen docusate sodium (COLACE) 100 MG capsule Take 100 mg by mouth as directed. (Patient not taking: Reported on 11/06/2019)  . furosemide (LASIX) 20 MG tablet Take 20 mg by mouth daily as needed for fluid.   . hydrALAZINE (APRESOLINE) 25 MG tablet Take 1 tablet (25 mg total) by mouth 2 (two) times daily.  Marland Kitchen ipratropium-albuterol (DUONEB) 0.5-2.5 (3) MG/3ML SOLN Take 3 mLs by nebulization in the morning and at bedtime.  . lamoTRIgine (LAMICTAL) 100 MG tablet Take 200 mg by mouth 2 (two) times daily.   . montelukast (SINGULAIR) 10 MG tablet Take 1 tablet (10 mg total) by mouth at bedtime.  . Multiple Vitamin (MULTIVITAMIN) capsule Take 1 capsule by mouth daily.   . nitroGLYCERIN (NITROSTAT) 0.4 MG SL tablet Place 1 tablet (0.4 mg total) under the tongue every 5 (five) minutes as needed for chest pain.  Marland Kitchen oxyCODONE-acetaminophen (PERCOCET) 10-325 MG per tablet Take 1 tablet by mouth every 4 (four) hours as needed for pain.   . polyethylene glycol (MIRALAX / GLYCOLAX) packet Take 17 g by mouth daily as needed for mild constipation or moderate constipation.   . potassium chloride (KLOR-CON) 8 MEQ tablet Take 8 mEq by mouth daily.   Marland Kitchen Propylene Glycol (SYSTANE BALANCE OP) Place 1 drop into both eyes daily as needed (dry eyes).  . sertraline (ZOLOFT) 50 MG tablet Take 50 mg by mouth daily.   . Tiotropium Bromide Monohydrate (SPIRIVA RESPIMAT) 1.25 MCG/ACT AERS Inhale 2 puffs into the lungs at bedtime.  Marland Kitchen tiZANidine (ZANAFLEX) 4 MG tablet Take 4 mg by mouth 4 (four) times daily as needed for muscle spasms.   . valsartan (DIOVAN) 320 MG tablet TAKE 1 TABLET BY MOUTH EVERY DAY (Patient taking differently: Take 320 mg by mouth daily. )   No  facility-administered encounter medications on file as of 11/13/2019.     REVIEW OF SYSTEMS:  Gen: Denies  fever, sweats or chills. No weight loss.  CV: Denies chest pain, palpitations or edema. Resp: + SOB. GI: See HPI.  GU : Denies urinary burning, blood in urine, increased urinary frequency or incontinence. MS: + Joint pain, knee pain.  Derm: Denies rash, itchiness, skin lesions or unhealing ulcers. Psych: + anxiety and depression.  Heme: Denies bruising, bleeding. Neuro:  Denies headaches, dizziness or paresthesias. Endo:  Denies any problems with DM, thyroid or adrenal function.  PHYSICAL EXAM: BP 120/70 (BP Location: Left Arm, Patient Position: Sitting, Cuff Size: Normal)   Pulse 80   Ht 5' 2.6" (1.59 m) Comment: height measured without shoes  Wt 181 lb 6 oz (82.3 kg)   BMI 32.54 kg/m   General: 74 year old female, appears older than her stated age in no acute distress. Head: Normocephalic and atraumatic. Eyes:  Sclerae non-icteric, conjunctive pink. Ears: Normal auditory acuity. Mouth: Upper and lower dentures. No ulcers or lesions.  Neck: Supple, no lymphadenopathy or thyromegaly.  Lungs: Clear bilaterally to auscultation without wheezes, crackles or rhonchi. Heart: Regular rate and rhythm. Systolic murmur. No rub or gallop appreciated.  Abdomen: Upper abdomen is protuberant but soft. Tympanic to percussion. Nontender. No masses. No hepatosplenomegaly. Normoactive bowel sounds x 4 quadrants.  Rectal: No external hemorrhoids. Small amount of golden brown stool in rectum trace heme positive. Anoscopy utilized, thin layer of noninflamed internal hemorrhoids. Rectal mucosa was pink without obvious proctitis to visualized distal rectum.  Sophia MA present during exam.  Musculoskeletal: Symmetrical with no gross deformities. Skin: Warm and dry. No rash or lesions on visible extremities. Extremities: No edema. Neurological: Alert oriented x 4, no focal deficits.  Psychological:   Alert and cooperative. Normal mood and affect.  ASSESSMENT AND PLAN:  70. 74 year old female with hematochezia, suspect was diverticular bleed -Miralax Q HS as needed -CBC, Iron, iron saturation, TIBC, Ferritin and BMP -Recommend abd/pelvic CT angiogram if rectal bleeding recurs  -Colonoscopy deferred for now due to patient's multiple comorbidities including CAD, progression of carotid artery stenosis with repeat carotid doppler scheduled 12/05/2019, COPD recently requiring PRN oxygen at home and history of severe diverticular disease all of which increase her risk for procedure/sedation complications. However, if she demonstrates further active GI bleeding or if her anemia worsens endoscopic evaluation in the hospital setting to be pursed. Further recommendations per Dr. Henrene Pastor.  -Patient to call our office if rectal bleeding recurs, she was advised to present to the ED if she develops severe rectal bleeding  2. History of squamous cell vaginal cancer, s/p radiation 2014  3. Renal cell carcinoma s/p partial left nephrectomy 2014  4. CAD s/p MI stent to RCA on ASA  5. History of hyperplastic colon polyps    CC:  Harlan Stains, MD

## 2019-11-13 ENCOUNTER — Other Ambulatory Visit (INDEPENDENT_AMBULATORY_CARE_PROVIDER_SITE_OTHER): Payer: Medicare Other

## 2019-11-13 ENCOUNTER — Encounter: Payer: Self-pay | Admitting: Nurse Practitioner

## 2019-11-13 ENCOUNTER — Ambulatory Visit (INDEPENDENT_AMBULATORY_CARE_PROVIDER_SITE_OTHER): Payer: Medicare Other | Admitting: Nurse Practitioner

## 2019-11-13 VITALS — BP 120/70 | HR 80 | Ht 62.6 in | Wt 181.4 lb

## 2019-11-13 DIAGNOSIS — K5790 Diverticulosis of intestine, part unspecified, without perforation or abscess without bleeding: Secondary | ICD-10-CM | POA: Diagnosis not present

## 2019-11-13 DIAGNOSIS — D649 Anemia, unspecified: Secondary | ICD-10-CM

## 2019-11-13 DIAGNOSIS — K625 Hemorrhage of anus and rectum: Secondary | ICD-10-CM

## 2019-11-13 LAB — IBC + FERRITIN
Ferritin: 10.8 ng/mL (ref 10.0–291.0)
Iron: 54 ug/dL (ref 42–145)
Saturation Ratios: 11.1 % — ABNORMAL LOW (ref 20.0–50.0)
Transferrin: 348 mg/dL (ref 212.0–360.0)

## 2019-11-13 LAB — BASIC METABOLIC PANEL
BUN: 19 mg/dL (ref 6–23)
CO2: 26 mEq/L (ref 19–32)
Calcium: 10.2 mg/dL (ref 8.4–10.5)
Chloride: 104 mEq/L (ref 96–112)
Creatinine, Ser: 0.89 mg/dL (ref 0.40–1.20)
GFR: 63.7 mL/min (ref >60.00)
Glucose, Bld: 120 mg/dL — ABNORMAL HIGH (ref 70–99)
Potassium: 4.4 mEq/L (ref 3.5–5.1)
Sodium: 138 mEq/L (ref 135–145)

## 2019-11-13 LAB — FERRITIN: Ferritin: 11.2 ng/mL (ref 10.0–291.0)

## 2019-11-13 LAB — IRON: Iron: 54 ug/dL (ref 42–145)

## 2019-11-13 NOTE — Progress Notes (Signed)
Agree with conservative measures in this medically complex, high risk, patient with previously demonstrated technically difficult colonoscopy.

## 2019-11-13 NOTE — Patient Instructions (Addendum)
If you are age 74 or older, your body mass index should be between 23-30. Your Body mass index is 32.54 kg/m. If this is out of the aforementioned range listed, please consider follow up with your Primary Care Provider.  If you are age 41 or younger, your body mass index should be between 19-25. Your Body mass index is 32.54 kg/m. If this is out of the aformentioned range listed, please consider follow up with your Primary Care Provider.   Your provider has requested that you go to the basement level for lab work before leaving today. Press "B" on the elevator. The lab is located at the first door on the left as you exit the elevator.  Due to recent changes in healthcare laws, you may see the results of your imaging and laboratory studies on MyChart before your provider has had a chance to review them.  We understand that in some cases there may be results that are confusing or concerning to you. Not all laboratory results come back in the same time frame and the provider may be waiting for multiple results in order to interpret others.  Please give [place] 48 hours in order for your provider to thoroughly review all the results before contacting the office for clarification of your results.   Use Miralax 17 grams (1 capful) in 8 ounces of water or juice every night as needed to avoid straining or constipation.  Call the office if rectal bleeding occurs, go to the ER if severe rectal bleeding occurs.  We will have further recommendations after your lab results have returned.

## 2019-11-14 ENCOUNTER — Other Ambulatory Visit (INDEPENDENT_AMBULATORY_CARE_PROVIDER_SITE_OTHER): Payer: Medicare Other

## 2019-11-14 DIAGNOSIS — K5790 Diverticulosis of intestine, part unspecified, without perforation or abscess without bleeding: Secondary | ICD-10-CM | POA: Diagnosis not present

## 2019-11-14 DIAGNOSIS — D649 Anemia, unspecified: Secondary | ICD-10-CM | POA: Diagnosis not present

## 2019-11-14 DIAGNOSIS — K625 Hemorrhage of anus and rectum: Secondary | ICD-10-CM | POA: Diagnosis not present

## 2019-11-14 LAB — CBC WITH DIFFERENTIAL/PLATELET
Basophils Absolute: 0.1 10*3/uL (ref 0.0–0.1)
Basophils Relative: 1.2 % (ref 0.0–3.0)
Eosinophils Absolute: 0.1 10*3/uL (ref 0.0–0.7)
Eosinophils Relative: 2.1 % (ref 0.0–5.0)
HCT: 32.1 % — ABNORMAL LOW (ref 36.0–46.0)
Hemoglobin: 10.8 g/dL — ABNORMAL LOW (ref 12.0–15.0)
Lymphocytes Relative: 22.6 % (ref 12.0–46.0)
Lymphs Abs: 1.2 10*3/uL (ref 0.7–4.0)
MCHC: 33.6 g/dL (ref 30.0–36.0)
MCV: 92.9 fl (ref 78.0–100.0)
Monocytes Absolute: 0.4 10*3/uL (ref 0.1–1.0)
Monocytes Relative: 8.5 % (ref 3.0–12.0)
Neutro Abs: 3.4 10*3/uL (ref 1.4–7.7)
Neutrophils Relative %: 65.6 % (ref 43.0–77.0)
Platelets: 241 10*3/uL (ref 150.0–400.0)
RBC: 3.46 Mil/uL — ABNORMAL LOW (ref 3.87–5.11)
RDW: 13.8 % (ref 11.5–15.5)
WBC: 5.2 10*3/uL (ref 4.0–10.5)

## 2019-11-14 LAB — IRON, TOTAL/TOTAL IRON BINDING CAP
%SAT: 13 % (calc) — ABNORMAL LOW (ref 16–45)
Iron: 57 ug/dL (ref 45–160)
TIBC: 441 mcg/dL (calc) (ref 250–450)

## 2019-11-18 ENCOUNTER — Other Ambulatory Visit: Payer: Self-pay

## 2019-11-18 DIAGNOSIS — D649 Anemia, unspecified: Secondary | ICD-10-CM

## 2019-11-19 DIAGNOSIS — Z23 Encounter for immunization: Secondary | ICD-10-CM | POA: Diagnosis not present

## 2019-11-25 DIAGNOSIS — F319 Bipolar disorder, unspecified: Secondary | ICD-10-CM | POA: Diagnosis not present

## 2019-11-25 DIAGNOSIS — Z23 Encounter for immunization: Secondary | ICD-10-CM | POA: Diagnosis not present

## 2019-11-25 DIAGNOSIS — M810 Age-related osteoporosis without current pathological fracture: Secondary | ICD-10-CM | POA: Diagnosis not present

## 2019-11-25 DIAGNOSIS — D509 Iron deficiency anemia, unspecified: Secondary | ICD-10-CM | POA: Diagnosis not present

## 2019-11-25 DIAGNOSIS — E785 Hyperlipidemia, unspecified: Secondary | ICD-10-CM | POA: Diagnosis not present

## 2019-11-25 DIAGNOSIS — J9611 Chronic respiratory failure with hypoxia: Secondary | ICD-10-CM | POA: Diagnosis not present

## 2019-11-25 DIAGNOSIS — I10 Essential (primary) hypertension: Secondary | ICD-10-CM | POA: Diagnosis not present

## 2019-11-25 DIAGNOSIS — J449 Chronic obstructive pulmonary disease, unspecified: Secondary | ICD-10-CM | POA: Diagnosis not present

## 2019-11-26 ENCOUNTER — Ambulatory Visit: Payer: Medicare Other

## 2019-11-26 NOTE — Progress Notes (Signed)
Cardiology Office Note   Date:  11/28/2019   ID:  ELIM PEALE, DOB 25-Feb-1945, MRN [id number]  PCP:  Harlan Stains, MD  Cardiologist:  Evelynn Hench [place], MD EP: None  Chief Complaint  Patient presents with   Follow-up    6 months.   Shortness of Breath      History of Present Illness: [name] is a 74 y.o. female with PMH of CAD s/p inferior MI with PCI to RCA in 2000, chronic combined CHF (EF 45-50% 02/2017), carotid artery stenosis s/p R CEA in 1995, PAD with moderate bilateral fem-pop disease medically, HTN, HLD, COPD, and renal CA s/p partial L nephrectomy in 2014.   Her last ischemic evaluation was a NST 02/2017 which showed EF 51%, fixed defect in the inferolateral wall which was felt to be reflective of prior inferior MI. Prior  echocardiogram 02/2017 showed EF 45-50%, hypokinesis and scarring of basal inferolateral and inferior myocardium c/w infarction in the RCA, and G1DD.   Since her last visit Echo was repeated 03/20/19 and showed normal LV function. EF 55-60%. No WMA. Recent LE dopplers in June showed stable moderate disease.   On follow up today she reports she is doing OK Denies any chest pain. She has been having more dyspnea. Seen by pulmonary and now on Duoneb nebulizer treatments and home oxygen. Found to have a left anterior pulmonary nodule that is being followed by CT. Had some rectal bleeding. Seen by Dr Henrene Pastor. Colonoscopy not felt to be necessary. Reports Hgb improved by labs this week.   No edema. Tolerating medication well. No palpitations.  No TIA or CVA symptoms.     Past Medical History:  Diagnosis Date   Alcoholism ([place])    recovering since 2000   Anxiety and depression    Arthritis BACK   Bipolar disorder ([place])    Blepharospasm LEFT EYE   CHF exacerbation ([place]) 02/24/2017   Chronic back pain    COPD (chronic obstructive pulmonary disease) (HCC)    Coronary artery disease CARDIOLOGIST- DR Martinique--- LAST VISIT NOTE 09-07-2009  W/  CHART   Emphysema    GERD (gastroesophageal reflux disease)    History of alcohol abuse RECOVERING SINCE 2000   History of Left renal mass 03/02/2012   underwent partial nephrectomy   HTN (hypertension)    Idiopathic acute facial nerve palsy LEFT SIDE--  BOTOX THERAPY   Inferior MI ([place]) 2000--  POST PTCA W/ STENT X1   Osteoporosis    Other and unspecified general anesthetics causing adverse effect in therapeutic use post op delirium--  last anes record w/ chart  from   09-22-2009 (spinal w/ light sedation)   Peripheral vascular disease ([place]) POST RIGHT CAROTID SURG.  1995   Renal cell carcinoma ([place]) 07/09/12   Left mass   Rosacea LEFT FACIAL RASH   S/P radiation therapy 02/21/2012   38.75 Gy HDR 5 Fractions- vaginal cuff   Scoliosis    Seizures (HCC)    x 1 after abrupt discontinuation of  Clonidine   Status post carotid endarterectomy RIGHT --  1995   Status post primary angioplasty with coronary stent 2000--  POST INFERIOR MI   Unstable balance WALKS W/ CANE   Vaginal cancer ([place])     Past Surgical History:  Procedure Laterality Date   BREAST EXCISIONAL BIOPSY Left 2019   b9 axilla Bx X 3   CAROTID ENDARTERECTOMY  1995   RIGHT   CATARACT EXTRACTION W/ INTRAOCULAR LENS  IMPLANT,  BILATERAL     CERVICAL CONIZATION W/BX  09-23-2008   CORONARY ANGIOPLASTY WITH STENT PLACEMENT  2000-   INFERIOR MI   X1 STENT TO RCA   EUS N/A 03/05/2012   Procedure: FULL UPPER ENDOSCOPIC ULTRASOUND (EUS) RADIAL and EGD;  Surgeon: Milus Banister, MD;  Location: [facility];  Service: Endoscopy;  Laterality: N/A;  ercp scope first than eus scope   HEMIARTHROPLASTY HIP  12-26-2008   LEFT FEMORAL NECK FX   ORIF HIP FRACTURE  02-13-2007   RIGHT FEMORAL NECK FX   ORIF RIGHT DISTAL RADIUS AND RIGHT PROXIMAL HUMEROUS NECK FX'S  10-10-2005   RIGHT SHOULDER SURG.  2007   ROBOTIC ASSITED PARTIAL NEPHRECTOMY Left 07/09/2012   Procedure: ROBOTIC ASSITED PARTIAL NEPHRECTOMY;   Surgeon: Dutch Gray, MD;  Location: [facility];  Service: Urology;  Laterality: Left;   TOTAL HIP ARTHROPLASTY  04-15-2008   POST FAILED  RIGHT HIP ORIF FEMORAL FX   TOTAL KNEE ARTHROPLASTY  09-22-2009   RIGHT   UPPER RIGHT VAGINAL REGION  12/28/11   BIOPSY: SQUAMOUS CELL CARCINOMA   VAGINAL HYSTERECTOMY  07/06/2009   Secondary to dysplasia     Current Outpatient Medications  Medication Sig Dispense Refill   albuterol (PROVENTIL HFA;VENTOLIN HFA) 108 (90 BASE) MCG/ACT inhaler Inhale 2 puffs into the lungs every 6 (six) hours as needed for wheezing.     amlodipine-atorvastatin (CADUET) 10-20 MG tablet Take 1 tablet by mouth daily. 30 tablet 6   ARIPiprazole (ABILIFY) 20 MG tablet Take 10 mg by mouth daily.      aspirin EC 81 MG tablet Take 81 mg by mouth every morning.      budesonide-formoterol (SYMBICORT) 160-4.5 MCG/ACT inhaler Inhale 2 puffs into the lungs 2 (two) times daily.     celecoxib (CELEBREX) 200 MG capsule Take 200 mg by mouth daily as needed for mild pain.      Cholecalciferol (VITAMIN D3) 2000 UNITS TABS Take 2,000 Units by mouth daily.      dexlansoprazole (DEXILANT) 60 MG capsule Take 60 mg by mouth daily.     docusate sodium (COLACE) 100 MG capsule Take 100 mg by mouth as directed.      furosemide (LASIX) 20 MG tablet Take 20 mg by mouth daily as needed for fluid.      ipratropium-albuterol (DUONEB) 0.5-2.5 (3) MG/3ML SOLN Take 3 mLs by nebulization in the morning and at bedtime. 360 mL 11   lamoTRIgine (LAMICTAL) 100 MG tablet Take 200 mg by mouth 2 (two) times daily.      montelukast (SINGULAIR) 10 MG tablet Take 1 tablet (10 mg total) by mouth at bedtime. 30 tablet 3   Multiple Vitamin (MULTIVITAMIN) capsule Take 1 capsule by mouth daily.      nitroGLYCERIN (NITROSTAT) 0.4 MG SL tablet Place 1 tablet (0.4 mg total) under the tongue every 5 (five) minutes as needed for chest pain. 25 tablet 3   oxyCODONE-acetaminophen (PERCOCET) 10-325 MG per tablet  Take 1 tablet by mouth every 4 (four) hours as needed for pain.      polyethylene glycol (MIRALAX / GLYCOLAX) packet Take 17 g by mouth daily as needed for mild constipation or moderate constipation.      potassium chloride (KLOR-CON) 8 MEQ tablet Take 8 mEq by mouth daily.      Propylene Glycol (SYSTANE BALANCE OP) Place 1 drop into both eyes daily as needed (dry eyes).     sertraline (ZOLOFT) 50 MG tablet Take 50 mg by mouth daily.  Tiotropium Bromide Monohydrate (SPIRIVA RESPIMAT) 1.25 MCG/ACT AERS Inhale 2 puffs into the lungs at bedtime.     tiZANidine (ZANAFLEX) 4 MG tablet Take 4 mg by mouth 4 (four) times daily as needed for muscle spasms.      valsartan (DIOVAN) 320 MG tablet TAKE 1 TABLET BY MOUTH EVERY DAY (Patient taking differently: Take 320 mg by mouth daily. ) 90 tablet 2   hydrALAZINE (APRESOLINE) 25 MG tablet Take 1 tablet (25 mg total) by mouth 2 (two) times daily. 270 tablet 3   No current facility-administered medications for this visit.    Allergies:   Doxycycline and Gabapentin    Social History:  The patient  reports that she quit smoking about 9 years ago. Her smoking use included cigarettes. She has a 100.00 pack-year smoking history. She has never used smokeless tobacco. She reports previous alcohol use. She reports that she does not use drugs.   Family History:  The patient's family history includes Cancer in her maternal grandfather; Hypertension in her father, mother, and sister.    ROS:  Please see the history of present illness.   Otherwise, review of systems are positive for none.   All other systems are reviewed and negative.    PHYSICAL EXAM: VS:  BP (!) 142/84 (BP Location: Left Arm, Patient Position: Sitting, Cuff Size: Large)    Pulse 86    Ht 5\' 3"  (1.6 m)    Wt 180 lb (81.6 kg)    BMI 31.89 kg/m  , BMI Body mass index is 31.89 kg/m. GEN: Well nourished, well developed, in no acute distress HEENT: sclera anicteric  Neck: no JVD, carotid  bruits, or masses Cardiac: RRR; no murmurs, rubs, or gallops, no edema  Respiratory:  clear GI: soft, nontender, nondistended, + BS MS: no deformity or atrophy Skin: warm and dry, no rash Neuro:  Strength and sensation are intact Psych: euthymic mood, full affect   EKG:  EKG is  Not ordered today.   Recent Labs: 03/06/2019: BNP 78.3 09/02/2019: Pro B Natriuretic peptide (BNP) 166.0 11/06/2019: ALT 19 11/13/2019: BUN 19; Creatinine, Ser 0.89; Potassium 4.4; Sodium 138 11/14/2019: Hemoglobin 10.8 Repeated and verified X2.; Platelets 241.0   Dated 02/22/19: A1c 5.8%. cholesterol 176, triglycerides 52, HDL 93, LDL 62. CMET, CBC, TSH normal Dated 08/22/19: A1c 5.9%.  Dated 11/06/19: CMET normal. Dated 11/25/19: hgb 11.5.    Lipid Panel    Component Value Date/Time   CHOL  12/25/2008 0550    161        ATP III CLASSIFICATION:  <200     mg/dL   Desirable  200-239  mg/dL   Borderline High  >=240    mg/dL   High          TRIG 39 12/25/2008 0550   HDL 102 12/25/2008 0550   CHOLHDL 1.6 12/25/2008 0550   VLDL 8 12/25/2008 0550   LDLCALC  12/25/2008 0550    51        Total Cholesterol/HDL:CHD Risk Coronary Heart Disease Risk Table                     Men   Women  1/2 Average Risk   3.4   3.3  Average Risk       5.0   4.4  2 X Average Risk   9.6   7.1  3 X Average Risk  23.4   11.0        Use the calculated Patient  Ratio above and the CHD Risk Table to determine the patient's CHD Risk.        ATP III CLASSIFICATION (LDL):  <100     mg/dL   Optimal  100-129  mg/dL   Near or Above                    Optimal  130-159  mg/dL   Borderline  160-189  mg/dL   High  >190     mg/dL   Very High      Wt Readings from Last 3 Encounters:  11/28/19 180 lb (81.6 kg)  11/13/19 181 lb 6 oz (82.3 kg)  11/06/19 178 lb (80.7 kg)      Other studies Reviewed: Additional studies/ records that were reviewed today include:   NST 02/2017: IMPRESSION: 1. Fixed defect in the inferolateral  wall, likely old infarct. There appears to be some mild peri-infarct ischemia in the adjacent lateral wall.  2. Decreased wall motion and thickening in the inferolateral wall.  3. Left ventricular ejection fraction 51%  4. Non invasive risk stratification*: High   Echocardiogram 02/2017: - Left ventricle: The cavity size was normal. There was mild  concentric hypertrophy. Systolic function was mildly reduced. The  estimated ejection fraction was in the range of 45% to 50%.  Hypokinesis and scarring of the basal-midinferolateral and  inferior myocardium; consistent with infarction in the  distribution of the right coronary or left circumflex coronary  artery. Doppler parameters are consistent with abnormal left  ventricular relaxation (grade 1 diastolic dysfunction).  - Mitral valve: Calcified annulus.   Echo 03/20/19: IMPRESSIONS    1. Left ventricular ejection fraction, by estimation, is 60 to 65%. The  left ventricle has normal function. The left ventricle has no regional  wall motion abnormalities. Left ventricular diastolic parameters are  consistent with Grade I diastolic  dysfunction (impaired relaxation). The average left ventricular global  longitudinal strain is -19.4 %.  2. Right ventricular systolic function is normal. The right ventricular  size is normal. There is mildly elevated pulmonary artery systolic  pressure.  3. Left atrial size was severely dilated.  4. The mitral valve is normal in structure and function. Mild mitral  valve regurgitation. No evidence of mitral stenosis.  5. The aortic valve is normal in structure and function. Aortic valve  regurgitation is not visualized. No aortic stenosis is present.  6. The inferior vena cava is normal in size with greater than 50%  respiratory variability, suggesting right atrial pressure of 3 mmHg.   LE arterial  dopplers 05/20/19: Summary:  Right: Resting right ankle-brachial index indicates  moderate right lower  extremity arterial disease. The right toe-brachial index is abnormal.   Left: Resting left ankle-brachial index indicates moderate left lower  extremity arterial disease. The left toe-brachial index is abnormal.   ASSESSMENT AND PLAN:  1. Acute on chronic combined CHF: EF 45-50% in 2019. Recent Echo in March showed normal LV function with EF 55-60%. Not on BBlocker due to baseline bradycardia.  - Continue lasix 20mg  daily PRN swelling.  - appears euvolemic today.   2. HTN: BP improved today - will continue current regimen.  3. CAD s/p MI with PCI to RCA in 2000:  - she is asymptomatic.  - Continue aspirin and statin  4. Carotid artery stenosis: s/p R CEA 1995.  -  repeat dopplers in June showed patent RCEA site. Increased velocity in left common carotid compared to prior. - Continue aspirin and statin -  has follow up scheduled with Dr Oneida Alar.   5. PAD: moderate bilateral fem-pop disease noted on last ABI 02/2018.  - no change by recent dopplers in June.  - Continue aspirin and statin  6. HLD: - Continue caduet (atorvastatin 20mg  daily). Lipids are at goal.   Current medicines are reviewed at length with the patient today.  The patient does not have concerns regarding medicines.  The following changes have been made:  As above  Labs/ tests ordered today include:   No orders of the defined types were placed in this encounter.    Disposition:   FU 6 months  Signed, Felicidad Sugarman [place], MD  11/28/2019 2:39 PM

## 2019-11-28 ENCOUNTER — Encounter: Payer: Self-pay | Admitting: Cardiology

## 2019-11-28 ENCOUNTER — Other Ambulatory Visit: Payer: Self-pay

## 2019-11-28 ENCOUNTER — Ambulatory Visit (INDEPENDENT_AMBULATORY_CARE_PROVIDER_SITE_OTHER): Payer: Medicare Other | Admitting: Cardiology

## 2019-11-28 VITALS — BP 142/84 | HR 86 | Ht 63.0 in | Wt 180.0 lb

## 2019-11-28 DIAGNOSIS — I2510 Atherosclerotic heart disease of native coronary artery without angina pectoris: Secondary | ICD-10-CM

## 2019-11-28 DIAGNOSIS — I739 Peripheral vascular disease, unspecified: Secondary | ICD-10-CM | POA: Diagnosis not present

## 2019-11-28 DIAGNOSIS — I5042 Chronic combined systolic (congestive) and diastolic (congestive) heart failure: Secondary | ICD-10-CM | POA: Diagnosis not present

## 2019-11-28 DIAGNOSIS — I10 Essential (primary) hypertension: Secondary | ICD-10-CM

## 2019-11-28 DIAGNOSIS — I6523 Occlusion and stenosis of bilateral carotid arteries: Secondary | ICD-10-CM | POA: Diagnosis not present

## 2019-11-28 DIAGNOSIS — E785 Hyperlipidemia, unspecified: Secondary | ICD-10-CM | POA: Diagnosis not present

## 2019-12-02 DIAGNOSIS — M810 Age-related osteoporosis without current pathological fracture: Secondary | ICD-10-CM | POA: Diagnosis not present

## 2019-12-05 ENCOUNTER — Ambulatory Visit (INDEPENDENT_AMBULATORY_CARE_PROVIDER_SITE_OTHER): Payer: Medicare Other | Admitting: Vascular Surgery

## 2019-12-05 ENCOUNTER — Encounter: Payer: Self-pay | Admitting: Vascular Surgery

## 2019-12-05 ENCOUNTER — Other Ambulatory Visit: Payer: Self-pay

## 2019-12-05 ENCOUNTER — Ambulatory Visit (HOSPITAL_COMMUNITY)
Admission: RE | Admit: 2019-12-05 | Discharge: 2019-12-05 | Disposition: A | Discharge status: Home / Self Care | Payer: Medicare Other | Source: Ambulatory Visit | Attending: Vascular Surgery | Admitting: Vascular Surgery

## 2019-12-05 VITALS — BP 133/73 | HR 66 | Temp 98.2°F | Resp 20 | Ht 63.0 in | Wt 180.0 lb

## 2019-12-05 DIAGNOSIS — I6521 Occlusion and stenosis of right carotid artery: Secondary | ICD-10-CM

## 2019-12-05 DIAGNOSIS — I6523 Occlusion and stenosis of bilateral carotid arteries: Secondary | ICD-10-CM | POA: Diagnosis present

## 2019-12-05 NOTE — Progress Notes (Signed)
Patient is a 74 year old female who returns for follow-up today.  He previously had a right carotid endarterectomy in 1995.  She did not remember her surgeon for this.  It was apparently asymptomatic.  She has not had any recent symptoms of TIA amaurosis or stroke.  She is on aspirin and a statin.  At her last office visit she was thought to have progression of common carotid artery stenosis.  This was also on the right side.  This was asymptomatic.  She was recently placed on home oxygen therapy by her primary care physician for progressive COPD.  She does not feel short of breath.  Review of systems: She has no chest pain.  Physical exam:  Vitals:   12/05/19 1238 12/05/19 1241  BP: (!) 142/79 133/73  Pulse: 66   Resp: 20   Temp: 98.2 F (36.8 C)   SpO2: 92%   Weight: 180 lb (81.6 kg)   Height: 5\' 3"  (1.6 m)     Neuro: Symmetric upper and lower extremity motor strength 5/5  Data: Patient had a carotid duplex exam today which showed greater than 50% distal common carotid artery stenosis at the proximal patch otherwise no significant internal carotid artery stenosis.  Velocities were 579/107.  This is compared to velocities of 391/63 3 months ago.  I reviewed and interpreted these images  Assessment: Recurrent right common carotid stenosis at proximal patch site progression over the last 3 months.  Remains asymptomatic.  Plan: Patient will be scheduled for CT angio of the head and neck and consideration for right TCAR carotid stenting if she has significant recurrent stenosis as suggested by duplex ultrasound.  Ruta Hinds, MD [facility] Office: [phone]

## 2019-12-06 ENCOUNTER — Other Ambulatory Visit: Payer: Self-pay | Admitting: *Deleted

## 2019-12-06 DIAGNOSIS — I6521 Occlusion and stenosis of right carotid artery: Secondary | ICD-10-CM

## 2019-12-10 ENCOUNTER — Other Ambulatory Visit: Payer: Self-pay | Admitting: Primary Care

## 2019-12-16 ENCOUNTER — Ambulatory Visit (HOSPITAL_COMMUNITY)
Admission: RE | Admit: 2019-12-16 | Discharge: 2019-12-16 | Disposition: A | Discharge status: Home / Self Care | Payer: Medicare Other | Source: Ambulatory Visit | Attending: Vascular Surgery | Admitting: Vascular Surgery

## 2019-12-16 ENCOUNTER — Other Ambulatory Visit: Payer: Self-pay

## 2019-12-16 DIAGNOSIS — I6521 Occlusion and stenosis of right carotid artery: Secondary | ICD-10-CM | POA: Insufficient documentation

## 2019-12-16 DIAGNOSIS — I708 Atherosclerosis of other arteries: Secondary | ICD-10-CM | POA: Diagnosis not present

## 2019-12-16 DIAGNOSIS — I672 Cerebral atherosclerosis: Secondary | ICD-10-CM | POA: Diagnosis not present

## 2019-12-16 DIAGNOSIS — I6523 Occlusion and stenosis of bilateral carotid arteries: Secondary | ICD-10-CM | POA: Diagnosis not present

## 2019-12-16 DIAGNOSIS — I6503 Occlusion and stenosis of bilateral vertebral arteries: Secondary | ICD-10-CM | POA: Diagnosis not present

## 2019-12-16 DIAGNOSIS — I771 Stricture of artery: Secondary | ICD-10-CM | POA: Diagnosis not present

## 2019-12-16 MED ORDER — IOHEXOL 350 MG/ML SOLN
80.0000 mL | Freq: Once | INTRAVENOUS | Status: AC | PRN
  Administered 2019-12-16: 80 mL via INTRAVENOUS

## 2019-12-19 ENCOUNTER — Ambulatory Visit (INDEPENDENT_AMBULATORY_CARE_PROVIDER_SITE_OTHER): Payer: Medicare Other | Admitting: Vascular Surgery

## 2019-12-19 ENCOUNTER — Other Ambulatory Visit: Payer: Self-pay

## 2019-12-19 VITALS — BP 138/85 | HR 72 | Temp 98.0°F | Resp 20 | Ht 63.0 in | Wt 180.0 lb

## 2019-12-19 DIAGNOSIS — I6521 Occlusion and stenosis of right carotid artery: Secondary | ICD-10-CM

## 2019-12-19 DIAGNOSIS — I6523 Occlusion and stenosis of bilateral carotid arteries: Secondary | ICD-10-CM | POA: Diagnosis not present

## 2019-12-19 NOTE — Progress Notes (Signed)
Patient is a 74 year old female who returns for follow-up today.  She previously had a right carotid endarterectomy in 1995.  It was for apparently an asymptomatic lesion.  She was noted on recent carotid duplex surveillance scan to have progression of common carotid artery stenosis.  Of note she is on home oxygen therapy for progressive COPD.  She has not had any new symptoms of TIA amaurosis or stroke.  She returns today after recent CT angiogram of the head and neck.  She is on a statin and aspirin.  Review of systems: She has no chest pain.  Otherwise as above  Past Medical History:  Diagnosis Date  . Alcoholism ([place])    recovering since 2000  . Anxiety and depression   . Arthritis BACK  . Bipolar disorder ([place])   . Blepharospasm LEFT EYE  . CHF exacerbation ([place]) 02/24/2017  . Chronic back pain   . COPD (chronic obstructive pulmonary disease) ([place])   . Coronary artery disease CARDIOLOGIST- DR [place]--- LAST VISIT NOTE 09-07-2009  W/ CHART  . Emphysema   . GERD (gastroesophageal reflux disease)   . History of alcohol abuse RECOVERING SINCE 2000  . History of Left renal mass 03/02/2012   underwent partial nephrectomy  . HTN (hypertension)   . Idiopathic acute facial nerve palsy LEFT SIDE--  BOTOX THERAPY  . Inferior MI ([place]) 2000--  POST PTCA W/ STENT X1  . Osteoporosis   . Other and unspecified general anesthetics causing adverse effect in therapeutic use post op delirium--  last anes record w/ chart  from   09-22-2009 (spinal w/ light sedation)  . Peripheral vascular disease ([place]) POST RIGHT CAROTID SURG.  1995  . Renal cell carcinoma ([place]) 07/09/12   Left mass  . Rosacea LEFT FACIAL RASH  . S/P radiation therapy 02/21/2012   38.75 Gy HDR 5 Fractions- vaginal cuff  . Scoliosis   . Seizures ([place])    x 1 after abrupt discontinuation of  Clonidine  . Status post carotid endarterectomy RIGHT --  1995  . Status post primary angioplasty with coronary stent 2000--  POST INFERIOR MI  .  Unstable balance WALKS W/ CANE  . Vaginal cancer [facility])     Past Surgical History:  Procedure Laterality Date  . BREAST EXCISIONAL BIOPSY Left 2019   b9 axilla Bx X 3  . CAROTID ENDARTERECTOMY  1995   RIGHT  . CATARACT EXTRACTION W/ INTRAOCULAR LENS  IMPLANT, BILATERAL    . CERVICAL CONIZATION W/BX  09-23-2008  . CORONARY ANGIOPLASTY WITH STENT PLACEMENT  2000-   INFERIOR MI   X1 STENT TO RCA  . EUS N/A 03/05/2012   Procedure: FULL UPPER ENDOSCOPIC ULTRASOUND (EUS) RADIAL and EGD;  Surgeon: Milus Banister, MD;  [facility]: [facility];  Service: Endoscopy;  Laterality: N/A;  ercp scope first than eus scope  . HEMIARTHROPLASTY HIP  12-26-2008   LEFT FEMORAL NECK FX  . ORIF HIP FRACTURE  02-13-2007   RIGHT FEMORAL NECK FX  . ORIF RIGHT DISTAL RADIUS AND RIGHT PROXIMAL HUMEROUS NECK FX'S  10-10-2005  . RIGHT SHOULDER SURG.  2007  . ROBOTIC ASSITED PARTIAL NEPHRECTOMY Left 07/09/2012   Procedure: ROBOTIC ASSITED PARTIAL NEPHRECTOMY;  Surgeon: Dutch Gray, MD;  Location: [facility];  Service: Urology;  Laterality: Left;  . TOTAL HIP ARTHROPLASTY  04-15-2008   POST FAILED  RIGHT HIP ORIF FEMORAL FX  . TOTAL KNEE ARTHROPLASTY  09-22-2009   RIGHT  . UPPER RIGHT VAGINAL REGION  12/28/11  BIOPSY: SQUAMOUS CELL CARCINOMA  . VAGINAL HYSTERECTOMY  07/06/2009   Secondary to dysplasia    Current Outpatient Medications on File Prior to Visit  Medication Sig Dispense Refill  . albuterol (PROVENTIL HFA;VENTOLIN HFA) 108 (90 BASE) MCG/ACT inhaler Inhale 2 puffs into the lungs every 6 (six) hours as needed for wheezing.    Marland Kitchen amlodipine-atorvastatin (CADUET) 10-20 MG tablet Take 1 tablet by mouth daily. 30 tablet 6  . ARIPiprazole (ABILIFY) 20 MG tablet Take 10 mg by mouth daily.     Marland Kitchen aspirin EC 81 MG tablet Take 81 mg by mouth every morning.     . budesonide-formoterol (SYMBICORT) 160-4.5 MCG/ACT inhaler Inhale 2 puffs into the lungs 2 (two) times daily.    . celecoxib (CELEBREX) 200 MG capsule Take  200 mg by mouth daily as needed for mild pain.     . Cholecalciferol (VITAMIN D3) 2000 UNITS TABS Take 2,000 Units by mouth daily.     Marland Kitchen dexlansoprazole (DEXILANT) 60 MG capsule Take 60 mg by mouth daily.    Marland Kitchen docusate sodium (COLACE) 100 MG capsule Take 100 mg by mouth as directed.     . furosemide (LASIX) 20 MG tablet Take 20 mg by mouth daily as needed for fluid.     . hydrALAZINE (APRESOLINE) 25 MG tablet Take 1 tablet (25 mg total) by mouth 2 (two) times daily. 270 tablet 3  . ipratropium-albuterol (DUONEB) 0.5-2.5 (3) MG/3ML SOLN Take 3 mLs by nebulization in the morning and at bedtime. 360 mL 11  . lamoTRIgine (LAMICTAL) 100 MG tablet Take 200 mg by mouth 2 (two) times daily.     . montelukast (SINGULAIR) 10 MG tablet TAKE 1 TABLET BY MOUTH EVERYDAY AT BEDTIME 90 tablet 1  . Multiple Vitamin (MULTIVITAMIN) capsule Take 1 capsule by mouth daily.     . nitroGLYCERIN (NITROSTAT) 0.4 MG SL tablet Place 1 tablet (0.4 mg total) under the tongue every 5 (five) minutes as needed for chest pain. 25 tablet 3  . oxyCODONE-acetaminophen (PERCOCET) 10-325 MG per tablet Take 1 tablet by mouth every 4 (four) hours as needed for pain.     . polyethylene glycol (MIRALAX / GLYCOLAX) packet Take 17 g by mouth daily as needed for mild constipation or moderate constipation.     . potassium chloride (KLOR-CON) 8 MEQ tablet Take 8 mEq by mouth daily.     Marland Kitchen Propylene Glycol (SYSTANE BALANCE OP) Place 1 drop into both eyes daily as needed (dry eyes).    . sertraline (ZOLOFT) 50 MG tablet Take 50 mg by mouth daily.     . Tiotropium Bromide Monohydrate (SPIRIVA RESPIMAT) 1.25 MCG/ACT AERS Inhale 2 puffs into the lungs at bedtime.    Marland Kitchen tiZANidine (ZANAFLEX) 4 MG tablet Take 4 mg by mouth 4 (four) times daily as needed for muscle spasms.     . valsartan (DIOVAN) 320 MG tablet TAKE 1 TABLET BY MOUTH EVERY DAY (Patient taking differently: Take 320 mg by mouth daily. ) 90 tablet 2   No current facility-administered  medications on file prior to visit.    Physical exam:  Vitals:   12/19/19 1453 12/19/19 1456  BP: (!) 151/81 138/85  Pulse: 72   Resp: 20   Temp: 98 F (36.7 C)   Weight: 180 lb (81.6 kg)   Height: 5\' 3"  (1.6 m)     Neuro: Symmetric upper extremity lower extremity motor strength 5/5 no facial asymmetry  Data: Patient had a CT angiogram performed of the  head and neck on December 16, 2019.  This shows a 60% stenosis at the proximal patch site on the common carotid artery.  This is densely calcified.  No significant internal carotid artery stenosis.  Left distal vertebral artery has a stenosis.  75% stenosis left subclavian artery.  Left internal carotid artery no significant stenosis.  Apical emphysema.  I reviewed and interpreted the CT  Assessment: Moderate recurrent stenosis right common carotid artery at the proximal patch site.  No significant distal internal carotid artery stenosis.  Currently asymptomatic  Plan: Continue aspirin and statin.  Follow-up carotid duplex in 6 months time.  Would consider intervention if she develops symptoms or stenosis progresses to greater than 80%.  She has continued increasing velocities we may also consider an intervention at that point.  Ruta Hinds, MD [facility] Office: [phone]

## 2019-12-23 ENCOUNTER — Telehealth: Payer: Self-pay | Admitting: Nurse Practitioner

## 2019-12-23 NOTE — Telephone Encounter (Signed)
Beth, pls contact patient and remind her to have the repeat labs (cbc and iron studies) done. You previously entered the lab orders. Refer to lab 11/14/2019 notes. thx

## 2019-12-23 NOTE — Telephone Encounter (Signed)
Contacted her. She says she will come this week.

## 2019-12-24 ENCOUNTER — Other Ambulatory Visit (INDEPENDENT_AMBULATORY_CARE_PROVIDER_SITE_OTHER): Payer: Medicare Other

## 2019-12-24 DIAGNOSIS — D649 Anemia, unspecified: Secondary | ICD-10-CM | POA: Diagnosis not present

## 2019-12-24 LAB — CBC WITH DIFFERENTIAL/PLATELET
Basophils Absolute: 0.1 10*3/uL (ref 0.0–0.1)
Basophils Relative: 1.3 % (ref 0.0–3.0)
Eosinophils Absolute: 0.1 10*3/uL (ref 0.0–0.7)
Eosinophils Relative: 1.4 % (ref 0.0–5.0)
HCT: 38.8 % (ref 36.0–46.0)
Hemoglobin: 12.7 g/dL (ref 12.0–15.0)
Lymphocytes Relative: 18.4 % (ref 12.0–46.0)
Lymphs Abs: 1.1 10*3/uL (ref 0.7–4.0)
MCHC: 32.7 g/dL (ref 30.0–36.0)
MCV: 91.1 fl (ref 78.0–100.0)
Monocytes Absolute: 0.4 10*3/uL (ref 0.1–1.0)
Monocytes Relative: 6.2 % (ref 3.0–12.0)
Neutro Abs: 4.2 10*3/uL (ref 1.4–7.7)
Neutrophils Relative %: 72.7 % (ref 43.0–77.0)
Platelets: 231 10*3/uL (ref 150.0–400.0)
RBC: 4.26 Mil/uL (ref 3.87–5.11)
RDW: 14.4 % (ref 11.5–15.5)
WBC: 5.8 10*3/uL (ref 4.0–10.5)

## 2019-12-24 LAB — IBC PANEL
Iron: 29 ug/dL — ABNORMAL LOW (ref 42–145)
Saturation Ratios: 6.2 % — ABNORMAL LOW (ref 20.0–50.0)
Transferrin: 333 mg/dL (ref 212.0–360.0)

## 2019-12-25 LAB — IRON,TIBC AND FERRITIN PANEL
%SAT: 7 % (calc) — ABNORMAL LOW (ref 16–45)
Ferritin: 18 ng/mL (ref 16–288)
Iron: 29 ug/dL — ABNORMAL LOW (ref 45–160)
TIBC: 431 mcg/dL (calc) (ref 250–450)

## 2019-12-27 ENCOUNTER — Telehealth: Payer: Self-pay | Admitting: Nurse Practitioner

## 2019-12-27 NOTE — Telephone Encounter (Signed)
Spoke with the patient. Advised her the labs are resulted but not reviewed by her provider. She advised me she does not have a computer and she cannot use My Chart. Per her request, My Chart is deactivated.

## 2019-12-27 NOTE — Telephone Encounter (Signed)
    Patient calling for lab results 

## 2019-12-31 ENCOUNTER — Other Ambulatory Visit: Payer: Self-pay

## 2019-12-31 DIAGNOSIS — D649 Anemia, unspecified: Secondary | ICD-10-CM

## 2020-01-02 ENCOUNTER — Ambulatory Visit (INDEPENDENT_AMBULATORY_CARE_PROVIDER_SITE_OTHER): Payer: Medicare Other | Admitting: Pulmonary Disease

## 2020-01-02 ENCOUNTER — Other Ambulatory Visit: Payer: Self-pay

## 2020-01-02 ENCOUNTER — Encounter: Payer: Self-pay | Admitting: Pulmonary Disease

## 2020-01-02 VITALS — BP 128/80 | HR 74 | Temp 98.4°F | Ht 63.0 in | Wt 180.4 lb

## 2020-01-02 DIAGNOSIS — R911 Solitary pulmonary nodule: Secondary | ICD-10-CM | POA: Diagnosis not present

## 2020-01-02 DIAGNOSIS — Z87891 Personal history of nicotine dependence: Secondary | ICD-10-CM

## 2020-01-02 DIAGNOSIS — J9611 Chronic respiratory failure with hypoxia: Secondary | ICD-10-CM

## 2020-01-02 DIAGNOSIS — R9389 Abnormal findings on diagnostic imaging of other specified body structures: Secondary | ICD-10-CM

## 2020-01-02 DIAGNOSIS — K219 Gastro-esophageal reflux disease without esophagitis: Secondary | ICD-10-CM | POA: Diagnosis not present

## 2020-01-02 DIAGNOSIS — J432 Centrilobular emphysema: Secondary | ICD-10-CM

## 2020-01-02 MED ORDER — BREZTRI AEROSPHERE 160-9-4.8 MCG/ACT IN AERO: 2.0000 | INHALATION_SPRAY | Freq: Two times a day (BID) | RESPIRATORY_TRACT | 0 refills | Status: DC

## 2020-01-02 NOTE — Patient Instructions (Signed)
Thank you for visiting Dr. Valeta Harms at [facility]. Today we recommend the following:  CT Chest in Jan 2022 for follow up of nodule  Please continue Symbicort and Spiriva   You can check on cost of Breztri through insurance.  Samples of Breztri today   Return in about 6 months (around 07/02/2020) for with APP or Dr. Valeta Harms.    Please do your part to reduce the spread of COVID-19.

## 2020-01-02 NOTE — Addendum Note (Signed)
Addended by: Elie Confer on: 01/02/2020 02:13 PM   Modules accepted: Orders

## 2020-01-02 NOTE — Progress Notes (Signed)
Synopsis: Referred in December 2021 for establish care with new primary pulmonologist, lung nodule, PCP: By Harlan Stains, MD  Subjective:   PATIENT ID: [name] GENDER: female DOB: May 05, 1945, MRN: [id number]  Chief Complaint  Patient presents with  . Follow-up    Former pt of Dr. Carlis Abbott.  Uses the oxygen at home only when she needs it.      This 74 year old female, history of coronary artery disease, longstanding history of smoking, history of renal cell carcinoma, hypertension.  Patient enrolled in our lung cancer screening program had abnormal lung cancer screening CT.  Prior pulmonary function tests with mild obstruction.  CT imaging though has severe emphysema.   Past Medical History:  Diagnosis Date  . Alcoholism ([place])    recovering since 2000  . Anxiety and depression   . Arthritis BACK  . Bipolar disorder ([place])   . Blepharospasm LEFT EYE  . CHF exacerbation ([place]) 02/24/2017  . Chronic back pain   . COPD (chronic obstructive pulmonary disease) ([place])   . Coronary artery disease CARDIOLOGIST- DR [place]--- LAST VISIT NOTE 09-07-2009  W/ CHART  . Emphysema   . GERD (gastroesophageal reflux disease)   . History of alcohol abuse RECOVERING SINCE 2000  . History of Left renal mass 03/02/2012   underwent partial nephrectomy  . HTN (hypertension)   . Idiopathic acute facial nerve palsy LEFT SIDE--  BOTOX THERAPY  . Inferior MI ([place]) 2000--  POST PTCA W/ STENT X1  . Osteoporosis   . Other and unspecified general anesthetics causing adverse effect in therapeutic use post op delirium--  last anes record w/ chart  from   09-22-2009 (spinal w/ light sedation)  . Peripheral vascular disease ([place]) POST RIGHT CAROTID SURG.  1995  . Renal cell carcinoma ([place]) 07/09/12   Left mass  . Rosacea LEFT FACIAL RASH  . S/P radiation therapy 02/21/2012   38.75 Gy HDR 5 Fractions- vaginal cuff  . Scoliosis   . Seizures ([place])    x 1 after abrupt discontinuation of  Clonidine  . Status  post carotid endarterectomy RIGHT --  1995  . Status post primary angioplasty with coronary stent 2000--  POST INFERIOR MI  . Unstable balance WALKS W/ CANE  . Vaginal cancer ([place])      Family History  Problem Relation Age of Onset  . Hypertension Mother   . Hypertension Father   . Cancer Maternal Grandfather        type unknown  . Hypertension Sister      Past Surgical History:  Procedure Laterality Date  . BREAST EXCISIONAL BIOPSY Left 2019   b9 axilla Bx X 3  . CAROTID ENDARTERECTOMY  1995   RIGHT  . CATARACT EXTRACTION W/ INTRAOCULAR LENS  IMPLANT, BILATERAL    . CERVICAL CONIZATION W/BX  09-23-2008  . CORONARY ANGIOPLASTY WITH STENT PLACEMENT  2000-   INFERIOR MI   X1 STENT TO RCA  . EUS N/A 03/05/2012   Procedure: FULL UPPER ENDOSCOPIC ULTRASOUND (EUS) RADIAL and EGD;  Surgeon: Milus Banister, MD;  Location: [facility];  Service: Endoscopy;  Laterality: N/A;  ercp scope first than eus scope  . HEMIARTHROPLASTY HIP  12-26-2008   LEFT FEMORAL NECK FX  . ORIF HIP FRACTURE  02-13-2007   RIGHT FEMORAL NECK FX  . ORIF RIGHT DISTAL RADIUS AND RIGHT PROXIMAL HUMEROUS NECK FX'S  10-10-2005  . RIGHT SHOULDER SURG.  2007  . ROBOTIC ASSITED PARTIAL NEPHRECTOMY Left 07/09/2012   Procedure: ROBOTIC  ASSITED PARTIAL NEPHRECTOMY;  Surgeon: Dutch Gray, MD;  Location: [facility];  Service: Urology;  Laterality: Left;  . TOTAL HIP ARTHROPLASTY  04-15-2008   POST FAILED  RIGHT HIP ORIF FEMORAL FX  . TOTAL KNEE ARTHROPLASTY  09-22-2009   RIGHT  . UPPER RIGHT VAGINAL REGION  12/28/11   BIOPSY: SQUAMOUS CELL CARCINOMA  . VAGINAL HYSTERECTOMY  07/06/2009   Secondary to dysplasia    Social History   Socioeconomic History  . Marital status: Divorced    Spouse name: Not on file  . Number of children: 0  . Years of education: Not on file  . Highest education level: Not on file  Occupational History  . Occupation: retired  Tobacco Use  . Smoking status: Former Smoker    Packs/day: 2.00     Years: 50.00    Pack years: 100.00    Types: Cigarettes    Quit date: 01/17/2010    Years since quitting: 9.9  . Smokeless tobacco: Never Used  . Tobacco comment: STATES QUIT SMOKING 01-17-2010  Vaping Use  . Vaping Use: Never used  Substance and Sexual Activity  . Alcohol use: Not Currently    Comment: RECOVERING ALCOHOLIC--   QUIT IN 4098  . Drug use: No  . Sexual activity: Never  Other Topics Concern  . Not on file  Social History Narrative  . Not on file   Social Determinants of Health   Financial Resource Strain: Not on file  Food Insecurity: Not on file  Transportation Needs: Not on file  Physical Activity: Not on file  Stress: Not on file  Social Connections: Not on file  Intimate Partner Violence: Not on file     Allergies  Allergen Reactions  . Doxycycline Diarrhea  . Gabapentin Other (See Comments)    sleepiness     Outpatient Medications Prior to Visit  Medication Sig Dispense Refill  . albuterol (PROVENTIL HFA;VENTOLIN HFA) 108 (90 BASE) MCG/ACT inhaler Inhale 2 puffs into the lungs every 6 (six) hours as needed for wheezing.    Marland Kitchen amlodipine-atorvastatin (CADUET) 10-20 MG tablet Take 1 tablet by mouth daily. 30 tablet 6  . ARIPiprazole (ABILIFY) 20 MG tablet Take 10 mg by mouth daily.     Marland Kitchen aspirin EC 81 MG tablet Take 81 mg by mouth every morning.     . budesonide-formoterol (SYMBICORT) 160-4.5 MCG/ACT inhaler Inhale 2 puffs into the lungs 2 (two) times daily.    . celecoxib (CELEBREX) 200 MG capsule Take 200 mg by mouth daily as needed for mild pain.     . Cholecalciferol (VITAMIN D3) 2000 UNITS TABS Take 2,000 Units by mouth daily.     Marland Kitchen dexlansoprazole (DEXILANT) 60 MG capsule Take 60 mg by mouth daily.    Marland Kitchen docusate sodium (COLACE) 100 MG capsule Take 100 mg by mouth as directed.     . furosemide (LASIX) 20 MG tablet Take 20 mg by mouth daily as needed for fluid.     Marland Kitchen ipratropium-albuterol (DUONEB) 0.5-2.5 (3) MG/3ML SOLN Take 3 mLs by nebulization  in the morning and at bedtime. 360 mL 11  . lamoTRIgine (LAMICTAL) 100 MG tablet Take 200 mg by mouth 2 (two) times daily.    . montelukast (SINGULAIR) 10 MG tablet TAKE 1 TABLET BY MOUTH EVERYDAY AT BEDTIME 90 tablet 1  . Multiple Vitamin (MULTIVITAMIN) capsule Take 1 capsule by mouth daily.     . nitroGLYCERIN (NITROSTAT) 0.4 MG SL tablet Place 1 tablet (0.4 mg total) under the  tongue every 5 (five) minutes as needed for chest pain. 25 tablet 3  . oxyCODONE-acetaminophen (PERCOCET) 10-325 MG per tablet Take 1 tablet by mouth every 4 (four) hours as needed for pain.     . polyethylene glycol (MIRALAX / GLYCOLAX) packet Take 17 g by mouth daily as needed for mild constipation or moderate constipation.     . potassium chloride (KLOR-CON) 8 MEQ tablet Take 8 mEq by mouth daily.     Marland Kitchen Propylene Glycol (SYSTANE BALANCE OP) Place 1 drop into both eyes daily as needed (dry eyes).    . sertraline (ZOLOFT) 50 MG tablet Take 50 mg by mouth daily.    . Tiotropium Bromide Monohydrate (SPIRIVA RESPIMAT) 1.25 MCG/ACT AERS Inhale 2 puffs into the lungs at bedtime.    Marland Kitchen tiZANidine (ZANAFLEX) 4 MG tablet Take 4 mg by mouth 4 (four) times daily as needed for muscle spasms.     . valsartan (DIOVAN) 320 MG tablet TAKE 1 TABLET BY MOUTH EVERY DAY (Patient taking differently: Take 320 mg by mouth daily.) 90 tablet 2  . hydrALAZINE (APRESOLINE) 25 MG tablet Take 1 tablet (25 mg total) by mouth 2 (two) times daily. 270 tablet 3   No facility-administered medications prior to visit.    Review of Systems  Constitutional: Negative for chills, fever, malaise/fatigue and weight loss.  HENT: Negative for hearing loss, sore throat and tinnitus.   Eyes: Negative for blurred vision and double vision.  Respiratory: Positive for cough and shortness of breath. Negative for hemoptysis, sputum production, wheezing and stridor.   Cardiovascular: Negative for chest pain, palpitations, orthopnea, leg swelling and PND.   Gastrointestinal: Negative for abdominal pain, constipation, diarrhea, heartburn, nausea and vomiting.  Genitourinary: Negative for dysuria, hematuria and urgency.  Musculoskeletal: Negative for joint pain and myalgias.  Skin: Negative for itching and rash.  Neurological: Negative for dizziness, tingling, weakness and headaches.  Endo/Heme/Allergies: Negative for environmental allergies. Does not bruise/bleed easily.  Psychiatric/Behavioral: Negative for depression. The patient is not nervous/anxious and does not have insomnia.   All other systems reviewed and are negative.    Objective:  Physical Exam Vitals reviewed.  Constitutional:      General: She is not in acute distress.    Appearance: She is well-developed.  HENT:     Head: Normocephalic and atraumatic.     Mouth/Throat:     Mouth: Oropharynx is clear and moist.     Pharynx: No oropharyngeal exudate.  Eyes:     Extraocular Movements: EOM normal.     Conjunctiva/sclera: Conjunctivae normal.     Pupils: Pupils are equal, round, and reactive to light.  Neck:     Vascular: No JVD.     Trachea: No tracheal deviation.     Comments: Loss of supraclavicular fat Cardiovascular:     Rate and Rhythm: Normal rate and regular rhythm.     Pulses: Intact distal pulses.     Heart sounds: S1 normal and S2 normal.     Comments: Distant heart tones Pulmonary:     Effort: No tachypnea or accessory muscle usage.     Breath sounds: No stridor. Decreased breath sounds (throughout all lung fields) present. No wheezing, rhonchi or rales.     Comments: Increased AP chest diameter Abdominal:     General: Bowel sounds are normal. There is no distension.     Palpations: Abdomen is soft.     Tenderness: There is no abdominal tenderness.  Musculoskeletal:  General: Deformity (muscle wasting ) present. No edema.  Skin:    General: Skin is warm and dry.     Capillary Refill: Capillary refill takes less than 2 seconds.     Findings: No  rash.  Neurological:     Mental Status: She is alert and oriented to person, place, and time.  Psychiatric:        Mood and Affect: Mood and affect normal.        Behavior: Behavior normal.      Vitals:   01/02/20 1331  BP: 128/80  Pulse: 74  Temp: 98.4 F (36.9 C)  TempSrc: Tympanic  SpO2: 90%  Weight: 180 lb 6 oz (81.8 kg)  Height: 5\' 3"  (1.6 m)   90% on RA BMI Readings from Last 3 Encounters:  01/02/20 31.95 kg/m  12/19/19 31.89 kg/m  12/05/19 31.89 kg/m   Wt Readings from Last 3 Encounters:  01/02/20 180 lb 6 oz (81.8 kg)  12/19/19 180 lb (81.6 kg)  12/05/19 180 lb (81.6 kg)     CBC    Component Value Date/Time   WBC 5.8 12/24/2019 1159   RBC 4.26 12/24/2019 1159   HGB 12.7 12/24/2019 1159   HCT 38.8 12/24/2019 1159   PLT 231.0 12/24/2019 1159   MCV 91.1 12/24/2019 1159   MCH 32.7 11/06/2019 1357   MCHC 32.7 12/24/2019 1159   RDW 14.4 12/24/2019 1159   LYMPHSABS 1.1 12/24/2019 1159   MONOABS 0.4 12/24/2019 1159   EOSABS 0.1 12/24/2019 1159   BASOSABS 0.1 12/24/2019 1159    Chest Imaging: 10/24/2019: Lung cancer screening CT lung RADS 4 a suspicious with a new 7.8 mm left upper lobe pulmonary nodule. The patient's images have been independently reviewed by me.    Pulmonary Functions Testing Results: PFT Results Latest Ref Rng & Units 07/02/2019  FVC-Pre L 2.74  FVC-Predicted Pre % 100  FVC-Post L 2.71  FVC-Predicted Post % 99  Pre FEV1/FVC % % 68  Post FEV1/FCV % % 69  FEV1-Pre L 1.86  FEV1-Predicted Pre % 91  FEV1-Post L 1.87  DLCO uncorrected ml/min/mmHg 9.87  DLCO UNC% % 53  DLCO corrected ml/min/mmHg 9.87  DLCO COR %Predicted % 53  DLVA Predicted % 55  TLC L 4.52  TLC % Predicted % 92  RV % Predicted % 84    FeNO:   Pathology:   Echocardiogram:   Heart Catheterization:     Assessment & Plan:     ICD-10-CM   1. Lung nodule, solitary  R91.1   2. Centrilobular emphysema ([place])  J43.2   3. Chronic respiratory failure with  hypoxia ([facility])  J96.11   4. Former smoker  Z87.891   5. Gastroesophageal reflux disease without esophagitis  K21.9   6. Abnormal screening CT of chest  R93.89     Discussion:  This is a 74 year old female severe centrilobular emphysema, mild obstruction on PFTs, solitary nodule within the left upper lobe anterior segment found on a abnormal lung cancer screening CT.  This nodule is new in comparison to previous images.  Plan: We discussed the risk benefits and alternatives of proceeding with close observation. I think she needs a repeat scan within 3 months of the previous. This will be rescheduled for super D noncontrasted CT in January 2022. With her current medical comorbidities hypoxemia severe emphysema and current physical state I am not sure that she be a great candidate for surgery. We can discuss this pending upon repeat CT scan. Once we  have this information we also discussed the need for potential biopsy which in fact may consider navigational bronchoscopy with tissue biopsy and possible fiducial placement.  As for the patient's COPD she is currently on triple therapy inhaler to include Symbicort plus Spiriva. Give patient samples today of breztri.  If she is doing well with triple therapy registry might be able to switch to 1 inhaler instead of 2.  Patient is okay with this plan.  Continue recommended use with spacer   Current Outpatient Medications:  .  albuterol (PROVENTIL HFA;VENTOLIN HFA) 108 (90 BASE) MCG/ACT inhaler, Inhale 2 puffs into the lungs every 6 (six) hours as needed for wheezing., Disp: , Rfl:  .  amlodipine-atorvastatin (CADUET) 10-20 MG tablet, Take 1 tablet by mouth daily., Disp: 30 tablet, Rfl: 6 .  ARIPiprazole (ABILIFY) 20 MG tablet, Take 10 mg by mouth daily. , Disp: , Rfl:  .  aspirin EC 81 MG tablet, Take 81 mg by mouth every morning. , Disp: , Rfl:  .  budesonide-formoterol (SYMBICORT) 160-4.5 MCG/ACT inhaler, Inhale 2 puffs into the lungs 2 (two) times  daily., Disp: , Rfl:  .  celecoxib (CELEBREX) 200 MG capsule, Take 200 mg by mouth daily as needed for mild pain. , Disp: , Rfl:  .  Cholecalciferol (VITAMIN D3) 2000 UNITS TABS, Take 2,000 Units by mouth daily. , Disp: , Rfl:  .  dexlansoprazole (DEXILANT) 60 MG capsule, Take 60 mg by mouth daily., Disp: , Rfl:  .  docusate sodium (COLACE) 100 MG capsule, Take 100 mg by mouth as directed. , Disp: , Rfl:  .  furosemide (LASIX) 20 MG tablet, Take 20 mg by mouth daily as needed for fluid. , Disp: , Rfl:  .  ipratropium-albuterol (DUONEB) 0.5-2.5 (3) MG/3ML SOLN, Take 3 mLs by nebulization in the morning and at bedtime., Disp: 360 mL, Rfl: 11 .  lamoTRIgine (LAMICTAL) 100 MG tablet, Take 200 mg by mouth 2 (two) times daily., Disp: , Rfl:  .  montelukast (SINGULAIR) 10 MG tablet, TAKE 1 TABLET BY MOUTH EVERYDAY AT BEDTIME, Disp: 90 tablet, Rfl: 1 .  Multiple Vitamin (MULTIVITAMIN) capsule, Take 1 capsule by mouth daily. , Disp: , Rfl:  .  nitroGLYCERIN (NITROSTAT) 0.4 MG SL tablet, Place 1 tablet (0.4 mg total) under the tongue every 5 (five) minutes as needed for chest pain., Disp: 25 tablet, Rfl: 3 .  oxyCODONE-acetaminophen (PERCOCET) 10-325 MG per tablet, Take 1 tablet by mouth every 4 (four) hours as needed for pain. , Disp: , Rfl:  .  polyethylene glycol (MIRALAX / GLYCOLAX) packet, Take 17 g by mouth daily as needed for mild constipation or moderate constipation. , Disp: , Rfl:  .  potassium chloride (KLOR-CON) 8 MEQ tablet, Take 8 mEq by mouth daily. , Disp: , Rfl:  .  Propylene Glycol (SYSTANE BALANCE OP), Place 1 drop into both eyes daily as needed (dry eyes)., Disp: , Rfl:  .  sertraline (ZOLOFT) 50 MG tablet, Take 50 mg by mouth daily., Disp: , Rfl:  .  Tiotropium Bromide Monohydrate (SPIRIVA RESPIMAT) 1.25 MCG/ACT AERS, Inhale 2 puffs into the lungs at bedtime., Disp: , Rfl:  .  tiZANidine (ZANAFLEX) 4 MG tablet, Take 4 mg by mouth 4 (four) times daily as needed for muscle spasms. , Disp: ,  Rfl:  .  valsartan (DIOVAN) 320 MG tablet, TAKE 1 TABLET BY MOUTH EVERY DAY (Patient taking differently: Take 320 mg by mouth daily.), Disp: 90 tablet, Rfl: 2 .  hydrALAZINE (APRESOLINE)  25 MG tablet, Take 1 tablet (25 mg total) by mouth 2 (two) times daily., Disp: 270 tablet, Rfl: 3    I spent 40 minutes dedicated to the care of this patient on the date of this encounter to include pre-visit review of records, face-to-face time with the patient discussing conditions above, post visit ordering of testing, clinical documentation with the electronic health record, making appropriate referrals as documented, and communicating necessary findings to members of the patients care team.   Garner Nash, DO [facility] 01/02/2020 1:49 PM

## 2020-01-03 ENCOUNTER — Ambulatory Visit
Admission: RE | Admit: 2020-01-03 | Discharge: 2020-01-03 | Disposition: A | Discharge status: Home / Self Care | Payer: Medicare Other | Source: Ambulatory Visit | Attending: Family Medicine | Admitting: Family Medicine

## 2020-01-03 DIAGNOSIS — Z1231 Encounter for screening mammogram for malignant neoplasm of breast: Secondary | ICD-10-CM

## 2020-01-08 ENCOUNTER — Other Ambulatory Visit: Payer: Self-pay | Admitting: *Deleted

## 2020-01-08 DIAGNOSIS — Z79899 Other long term (current) drug therapy: Secondary | ICD-10-CM | POA: Diagnosis not present

## 2020-01-08 MED ORDER — BREZTRI AEROSPHERE 160-9-4.8 MCG/ACT IN AERO: 2.0000 | INHALATION_SPRAY | Freq: Two times a day (BID) | RESPIRATORY_TRACT | 5 refills | Status: DC

## 2020-01-31 DIAGNOSIS — M25511 Pain in right shoulder: Secondary | ICD-10-CM | POA: Diagnosis not present

## 2020-02-04 ENCOUNTER — Other Ambulatory Visit: Payer: Medicare Other

## 2020-02-05 ENCOUNTER — Other Ambulatory Visit: Payer: Self-pay | Admitting: Pharmacist Clinician (PhC)/ Clinical Pharmacy Specialist

## 2020-02-12 DIAGNOSIS — H0100A Unspecified blepharitis right eye, upper and lower eyelids: Secondary | ICD-10-CM | POA: Diagnosis not present

## 2020-02-12 DIAGNOSIS — H16212 Exposure keratoconjunctivitis, left eye: Secondary | ICD-10-CM | POA: Diagnosis not present

## 2020-02-12 DIAGNOSIS — H04123 Dry eye syndrome of bilateral lacrimal glands: Secondary | ICD-10-CM | POA: Diagnosis not present

## 2020-02-12 DIAGNOSIS — G245 Blepharospasm: Secondary | ICD-10-CM | POA: Diagnosis not present

## 2020-02-17 DIAGNOSIS — J029 Acute pharyngitis, unspecified: Secondary | ICD-10-CM | POA: Diagnosis not present

## 2020-02-17 DIAGNOSIS — I10 Essential (primary) hypertension: Secondary | ICD-10-CM | POA: Diagnosis not present

## 2020-02-17 DIAGNOSIS — R0989 Other specified symptoms and signs involving the circulatory and respiratory systems: Secondary | ICD-10-CM | POA: Diagnosis not present

## 2020-02-18 ENCOUNTER — Other Ambulatory Visit: Payer: Medicare Other

## 2020-02-18 DIAGNOSIS — R0989 Other specified symptoms and signs involving the circulatory and respiratory systems: Secondary | ICD-10-CM | POA: Diagnosis not present

## 2020-02-18 DIAGNOSIS — J029 Acute pharyngitis, unspecified: Secondary | ICD-10-CM | POA: Diagnosis not present

## 2020-02-19 ENCOUNTER — Other Ambulatory Visit (INDEPENDENT_AMBULATORY_CARE_PROVIDER_SITE_OTHER): Payer: Medicare Other

## 2020-02-19 DIAGNOSIS — D649 Anemia, unspecified: Secondary | ICD-10-CM | POA: Diagnosis not present

## 2020-02-19 LAB — CBC WITH DIFFERENTIAL/PLATELET
Basophils Absolute: 0.1 10*3/uL (ref 0.0–0.1)
Basophils Relative: 1.2 % (ref 0.0–3.0)
Eosinophils Absolute: 0.1 10*3/uL (ref 0.0–0.7)
Eosinophils Relative: 2.4 % (ref 0.0–5.0)
HCT: 41.3 % (ref 36.0–46.0)
Hemoglobin: 14 g/dL (ref 12.0–15.0)
Lymphocytes Relative: 22.7 % (ref 12.0–46.0)
Lymphs Abs: 1.3 10*3/uL (ref 0.7–4.0)
MCHC: 33.8 g/dL (ref 30.0–36.0)
MCV: 93.3 fl (ref 78.0–100.0)
Monocytes Absolute: 0.4 10*3/uL (ref 0.1–1.0)
Monocytes Relative: 7 % (ref 3.0–12.0)
Neutro Abs: 3.8 10*3/uL (ref 1.4–7.7)
Neutrophils Relative %: 66.7 % (ref 43.0–77.0)
Platelets: 260 10*3/uL (ref 150.0–400.0)
RBC: 4.43 Mil/uL (ref 3.87–5.11)
RDW: 15.1 % (ref 11.5–15.5)
WBC: 5.8 10*3/uL (ref 4.0–10.5)

## 2020-02-19 LAB — IBC PANEL
Iron: 46 ug/dL (ref 42–145)
Saturation Ratios: 10.6 % — ABNORMAL LOW (ref 20.0–50.0)
Transferrin: 309 mg/dL (ref 212.0–360.0)

## 2020-02-20 LAB — IRON,TIBC AND FERRITIN PANEL
%SAT: 11 % (calc) — ABNORMAL LOW (ref 16–45)
Ferritin: 26 ng/mL (ref 16–288)
Iron: 44 ug/dL — ABNORMAL LOW (ref 45–160)
TIBC: 388 mcg/dL (calc) (ref 250–450)

## 2020-02-25 DIAGNOSIS — R609 Edema, unspecified: Secondary | ICD-10-CM | POA: Diagnosis not present

## 2020-02-25 DIAGNOSIS — J449 Chronic obstructive pulmonary disease, unspecified: Secondary | ICD-10-CM | POA: Diagnosis not present

## 2020-02-25 DIAGNOSIS — D509 Iron deficiency anemia, unspecified: Secondary | ICD-10-CM | POA: Diagnosis not present

## 2020-02-25 DIAGNOSIS — E785 Hyperlipidemia, unspecified: Secondary | ICD-10-CM | POA: Diagnosis not present

## 2020-02-25 DIAGNOSIS — F319 Bipolar disorder, unspecified: Secondary | ICD-10-CM | POA: Diagnosis not present

## 2020-02-25 DIAGNOSIS — I2510 Atherosclerotic heart disease of native coronary artery without angina pectoris: Secondary | ICD-10-CM | POA: Diagnosis not present

## 2020-02-25 DIAGNOSIS — Z1159 Encounter for screening for other viral diseases: Secondary | ICD-10-CM | POA: Diagnosis not present

## 2020-02-25 DIAGNOSIS — R7303 Prediabetes: Secondary | ICD-10-CM | POA: Diagnosis not present

## 2020-02-25 DIAGNOSIS — I10 Essential (primary) hypertension: Secondary | ICD-10-CM | POA: Diagnosis not present

## 2020-02-25 DIAGNOSIS — J9611 Chronic respiratory failure with hypoxia: Secondary | ICD-10-CM | POA: Diagnosis not present

## 2020-02-28 NOTE — Progress Notes (Signed)
Patient notified and labs ordered  

## 2020-03-02 ENCOUNTER — Ambulatory Visit
Admission: RE | Admit: 2020-03-02 | Discharge: 2020-03-02 | Disposition: A | Discharge status: Home / Self Care | Payer: Medicare Other | Source: Ambulatory Visit | Attending: Pulmonary Disease | Admitting: Pulmonary Disease

## 2020-03-02 DIAGNOSIS — S22080A Wedge compression fracture of T11-T12 vertebra, initial encounter for closed fracture: Secondary | ICD-10-CM | POA: Diagnosis not present

## 2020-03-02 DIAGNOSIS — I2510 Atherosclerotic heart disease of native coronary artery without angina pectoris: Secondary | ICD-10-CM | POA: Diagnosis not present

## 2020-03-02 DIAGNOSIS — R911 Solitary pulmonary nodule: Secondary | ICD-10-CM

## 2020-03-02 DIAGNOSIS — J432 Centrilobular emphysema: Secondary | ICD-10-CM | POA: Diagnosis not present

## 2020-03-02 DIAGNOSIS — Z85528 Personal history of other malignant neoplasm of kidney: Secondary | ICD-10-CM | POA: Diagnosis not present

## 2020-03-02 DIAGNOSIS — G518 Other disorders of facial nerve: Secondary | ICD-10-CM | POA: Diagnosis not present

## 2020-03-09 ENCOUNTER — Telehealth: Payer: Self-pay | Admitting: Pulmonary Disease

## 2020-03-09 NOTE — Telephone Encounter (Signed)
Her lung nodule we were following is no longer there consistent with an inflammatory lesion. She should return to lung cancer screening program in 1 year.   CC: [name] and [name]  Garner Nash, DO [facility] 03/09/2020 5:52 PM

## 2020-03-09 NOTE — Telephone Encounter (Signed)
Called and spoke with pt letting her know that message has already been sent to Dr. Valeta Harms about her requesting to know the results of her recent CT. Stated to her that we would call her once he has reviewed the results to let her know what they are and she verbalized understanding.

## 2020-03-09 NOTE — Telephone Encounter (Signed)
Called and spoke to patient, who is requesting CT results 03/02/2020.  Dr. Valeta Harms, please advise. Thanks

## 2020-03-10 ENCOUNTER — Telehealth: Payer: Self-pay | Admitting: Acute Care

## 2020-03-10 DIAGNOSIS — Z87891 Personal history of nicotine dependence: Secondary | ICD-10-CM

## 2020-03-10 NOTE — Telephone Encounter (Signed)
Pt returning a phone call. Pt has been provided her results by Eric Form, nothing further needed

## 2020-03-10 NOTE — Telephone Encounter (Signed)
Langley Gauss, just follow up CT in 1 year, and she will need follow up with [facility] in 1 year. We can schedule it after the scan has been done so he can review it with her. Thanks so much

## 2020-03-10 NOTE — Telephone Encounter (Signed)
I have called the patient with the results of her CT. I explained that the nodule we were following has resolved, and that we do not need to see her for a scan until 12 months from now, through the screening program. She verbalized understanding.   Charmayne Sheer, you don't need to see her before then, correct? Thanks so much.

## 2020-03-10 NOTE — Telephone Encounter (Signed)
Not unless she needs [place]. Otherwise we can see her in a year.   Thanks  Wright, DO [facility] 03/10/2020 11:07 AM

## 2020-03-10 NOTE — Telephone Encounter (Signed)
I have called and LM on VM for the pt to call back about her results.

## 2020-03-10 NOTE — Telephone Encounter (Signed)
Spoke with and reviewed CT results as provided by Dr. Valeta Harms. Pt states understanding. Nothing further needed at this time.

## 2020-03-12 NOTE — Addendum Note (Signed)
Addended by: Doroteo Glassman D on: 03/12/2020 08:19 AM   Modules accepted: Orders

## 2020-03-12 NOTE — Telephone Encounter (Signed)
Order placed for 1 year f/u low dose chest CT. We will schedule f/u with Dr Valeta Harms once CT has been done. Nothing further needed at this time.

## 2020-03-13 DIAGNOSIS — G8929 Other chronic pain: Secondary | ICD-10-CM | POA: Diagnosis not present

## 2020-03-17 ENCOUNTER — Telehealth: Payer: Self-pay | Admitting: Pulmonary Disease

## 2020-03-17 NOTE — Telephone Encounter (Signed)
LMTCB for [name] with [employer]. I did not see any forms on this pt in BI's lookat.

## 2020-03-18 NOTE — Telephone Encounter (Signed)
LMTCB

## 2020-03-20 NOTE — Telephone Encounter (Signed)
Closing encounter per protocol 

## 2020-03-23 ENCOUNTER — Telehealth: Payer: Self-pay | Admitting: Pulmonary Disease

## 2020-03-23 NOTE — Telephone Encounter (Signed)
OV notes have been printed and faxed to [employer], attn Wheeler.   Will close encounter.

## 2020-04-09 DIAGNOSIS — H02233 Paralytic lagophthalmos right eye, unspecified eyelid: Secondary | ICD-10-CM | POA: Diagnosis not present

## 2020-04-09 DIAGNOSIS — M791 Myalgia, unspecified site: Secondary | ICD-10-CM | POA: Diagnosis not present

## 2020-04-13 ENCOUNTER — Other Ambulatory Visit: Payer: Self-pay | Admitting: Primary Care

## 2020-04-17 DIAGNOSIS — M542 Cervicalgia: Secondary | ICD-10-CM | POA: Diagnosis not present

## 2020-04-20 DIAGNOSIS — M5412 Radiculopathy, cervical region: Secondary | ICD-10-CM | POA: Diagnosis not present

## 2020-04-23 ENCOUNTER — Other Ambulatory Visit: Payer: Self-pay | Admitting: Cardiology

## 2020-04-24 DIAGNOSIS — M5013 Cervical disc disorder with radiculopathy, cervicothoracic region: Secondary | ICD-10-CM | POA: Diagnosis not present

## 2020-04-24 DIAGNOSIS — M5412 Radiculopathy, cervical region: Secondary | ICD-10-CM | POA: Diagnosis not present

## 2020-04-28 ENCOUNTER — Other Ambulatory Visit: Payer: Self-pay | Admitting: Cardiology

## 2020-05-13 DIAGNOSIS — M5412 Radiculopathy, cervical region: Secondary | ICD-10-CM | POA: Diagnosis not present

## 2020-05-13 DIAGNOSIS — M5030 Other cervical disc degeneration, unspecified cervical region: Secondary | ICD-10-CM | POA: Diagnosis not present

## 2020-05-22 NOTE — Progress Notes (Deleted)
Cardiology Office Note   Date:  05/22/2020   [name], DOB 1945/05/01, [name]  PCP:  Harlan Stains, MD  Cardiologist:  Navon Kotowski [place], MD EP: None  No chief complaint on file.     History of Present Illness: [name] is a 75 y.o. female with PMH of CAD s/p inferior MI with PCI to RCA in 2000, chronic combined CHF (EF 45-50% 02/2017), carotid artery stenosis s/p R CEA in 1995, PAD with moderate bilateral fem-pop disease medically, HTN, HLD, COPD, and renal CA s/p partial L nephrectomy in 2014.   Her last ischemic evaluation was a NST 02/2017 which showed EF 51%, fixed defect in the inferolateral wall which was felt to be reflective of prior inferior MI. Prior  echocardiogram 02/2017 showed EF 45-50%, hypokinesis and scarring of basal inferolateral and inferior myocardium c/w infarction in the RCA, and G1DD.   Since her last visit Echo was repeated 03/20/19 and showed normal LV function. EF 55-60%. No WMA. Recent LE dopplers in June showed stable moderate disease.   On follow up today she reports she is doing OK Denies any chest pain. She has been having more dyspnea. Seen by pulmonary and now on Duoneb nebulizer treatments and home oxygen. Found to have a left anterior pulmonary nodule that is being followed by CT. Had some rectal bleeding. Seen by Dr Henrene Pastor. Colonoscopy not felt to be necessary. Reports Hgb improved by labs this week.   No edema. Tolerating medication well. No palpitations.  No TIA or CVA symptoms.     Past Medical History:  Diagnosis Date  . Alcoholism ([place])    recovering since 2000  . Anxiety and depression   . Arthritis BACK  . Bipolar disorder ([place])   . Blepharospasm LEFT EYE  . CHF exacerbation ([place]) 02/24/2017  . Chronic back pain   . COPD (chronic obstructive pulmonary disease) ([place])   . Coronary artery disease CARDIOLOGIST- DR Martinique--- LAST VISIT NOTE 09-07-2009  W/ CHART  . Emphysema   . GERD (gastroesophageal reflux disease)    . History of alcohol abuse RECOVERING SINCE 2000  . History of Left renal mass 03/02/2012   underwent partial nephrectomy  . HTN (hypertension)   . Idiopathic acute facial nerve palsy LEFT SIDE--  BOTOX THERAPY  . Inferior MI ([place]) 2000--  POST PTCA W/ STENT X1  . Osteoporosis   . Other and unspecified general anesthetics causing adverse effect in therapeutic use post op delirium--  last anes record w/ chart  from   09-22-2009 (spinal w/ light sedation)  . Peripheral vascular disease ([place]) POST RIGHT CAROTID SURG.  1995  . Renal cell carcinoma ([place]) 07/09/12   Left mass  . Rosacea LEFT FACIAL RASH  . S/P radiation therapy 02/21/2012   38.75 Gy HDR 5 Fractions- vaginal cuff  . Scoliosis   . Seizures ([place])    x 1 after abrupt discontinuation of  Clonidine  . Status post carotid endarterectomy RIGHT --  1995  . Status post primary angioplasty with coronary stent 2000--  POST INFERIOR MI  . Unstable balance WALKS W/ CANE  . Vaginal cancer [facility])     Past Surgical History:  Procedure Laterality Date  . BREAST EXCISIONAL BIOPSY Left 2019   b9 axilla Bx X 3  . CAROTID ENDARTERECTOMY  1995   RIGHT  . CATARACT EXTRACTION W/ INTRAOCULAR LENS  IMPLANT, BILATERAL    . CERVICAL CONIZATION W/BX  09-23-2008  . CORONARY ANGIOPLASTY WITH  STENT PLACEMENT  2000-   INFERIOR MI   X1 STENT TO RCA  . EUS N/A 03/05/2012   Procedure: FULL UPPER ENDOSCOPIC ULTRASOUND (EUS) RADIAL and EGD;  Surgeon: Milus Banister, MD;  Location: [facility] END[facility];  Service: Endoscopy;  Laterality: N/A;  ercp scope first than eus scope  . HEMIARTHROPLASTY HIP  12-26-2008   LEFT FEMORAL NECK FX  . ORIF HIP FRACTURE  02-13-2007   RIGHT FEMORAL NECK FX  . ORIF RIGHT DISTAL RADIUS AND RIGHT PROXIMAL HUMEROUS NECK FX'S  10-10-2005  . RIGHT SHOULDER SURG.  2007  . ROBOTIC ASSITED PARTIAL NEPHRECTOMY Left 07/09/2012   Procedure: ROBOTIC ASSITED PARTIAL NEPHRECTOMY;  Surgeon: Dutch Gray, MD;  Location: [facility] ORS;  Service: Urology;   Laterality: Left;  . TOTAL HIP ARTHROPLASTY  04-15-2008   POST FAILED  RIGHT HIP ORIF FEMORAL FX  . TOTAL KNEE ARTHROPLASTY  09-22-2009   RIGHT  . UPPER RIGHT VAGINAL REGION  12/28/11   BIOPSY: SQUAMOUS CELL CARCINOMA  . VAGINAL HYSTERECTOMY  07/06/2009   Secondary to dysplasia     Current Outpatient Medications  Medication Sig Dispense Refill  . albuterol (PROVENTIL HFA;VENTOLIN HFA) 108 (90 BASE) MCG/ACT inhaler Inhale 2 puffs into the lungs every 6 (six) hours as needed for wheezing.    . ARIPiprazole (ABILIFY) 20 MG tablet Take 10 mg by mouth daily.     Marland Kitchen aspirin EC 81 MG tablet Take 81 mg by mouth every morning.     . Budeson-Glycopyrrol-Formoterol (BREZTRI AEROSPHERE) 160-9-4.8 MCG/ACT AERO Inhale 2 puffs into the lungs in the morning and at bedtime. 10.7 g 5  . budesonide-formoterol (SYMBICORT) 160-4.5 MCG/ACT inhaler Inhale 2 puffs into the lungs 2 (two) times daily.    Marland Kitchen CADUET 10-20 MG tablet TAKE 1 TABLET BY MOUTH DAILY. 90 tablet 2  . celecoxib (CELEBREX) 200 MG capsule Take 200 mg by mouth daily as needed for mild pain.     . Cholecalciferol (VITAMIN D3) 2000 UNITS TABS Take 2,000 Units by mouth daily.     Marland Kitchen dexlansoprazole (DEXILANT) 60 MG capsule Take 60 mg by mouth daily.    Marland Kitchen docusate sodium (COLACE) 100 MG capsule Take 100 mg by mouth as directed.     . furosemide (LASIX) 20 MG tablet Take 20 mg by mouth daily as needed for fluid.     . hydrALAZINE (APRESOLINE) 25 MG tablet Take 1 tablet (25 mg total) by mouth 2 (two) times daily. 270 tablet 3  . ipratropium-albuterol (DUONEB) 0.5-2.5 (3) MG/3ML SOLN Take 3 mLs by nebulization in the morning and at bedtime. 360 mL 11  . lamoTRIgine (LAMICTAL) 100 MG tablet Take 200 mg by mouth 2 (two) times daily.    . montelukast (SINGULAIR) 10 MG tablet TAKE 1 TABLET BY MOUTH EVERYDAY AT BEDTIME 90 tablet 1  . Multiple Vitamin (MULTIVITAMIN) capsule Take 1 capsule by mouth daily.     . nitroGLYCERIN (NITROSTAT) 0.4 MG SL tablet Place  1 tablet (0.4 mg total) under the tongue every 5 (five) minutes as needed for chest pain. 25 tablet 3  . oxyCODONE-acetaminophen (PERCOCET) 10-325 MG per tablet Take 1 tablet by mouth every 4 (four) hours as needed for pain.     . polyethylene glycol (MIRALAX / GLYCOLAX) packet Take 17 g by mouth daily as needed for mild constipation or moderate constipation.     . potassium chloride (KLOR-CON) 8 MEQ tablet Take 8 mEq by mouth daily.     Marland Kitchen Propylene Glycol (SYSTANE BALANCE OP)  Place 1 drop into both eyes daily as needed (dry eyes).    . sertraline (ZOLOFT) 50 MG tablet Take 50 mg by mouth daily.    . Tiotropium Bromide Monohydrate (SPIRIVA RESPIMAT) 1.25 MCG/ACT AERS Inhale 2 puffs into the lungs at bedtime.    Marland Kitchen tiZANidine (ZANAFLEX) 4 MG tablet Take 4 mg by mouth 4 (four) times daily as needed for muscle spasms.     . valsartan (DIOVAN) 320 MG tablet TAKE 1 TABLET BY MOUTH EVERY DAY 90 tablet 2   No current facility-administered medications for this visit.    Allergies:   Doxycycline and Gabapentin    Social History:  The patient  reports that she quit smoking about 10 years ago. Her smoking use included cigarettes. She has a 100.00 pack-year smoking history. She has never used smokeless tobacco. She reports previous alcohol use. She reports that she does not use drugs.   Family History:  The patient's family history includes Cancer in her maternal grandfather; Hypertension in her father, mother, and sister.    ROS:  Please see the history of present illness.   Otherwise, review of systems are positive for none.   All other systems are reviewed and negative.    PHYSICAL EXAM: VS:  There were no vitals taken for this visit. , BMI There is no height or weight on file to calculate BMI. GEN: Well nourished, well developed, in no acute distress HEENT: sclera anicteric  Neck: no JVD, carotid bruits, or masses Cardiac: RRR; no murmurs, rubs, or gallops, no edema  Respiratory:  clear GI:  soft, nontender, nondistended, + BS MS: no deformity or atrophy Skin: warm and dry, no rash Neuro:  Strength and sensation are intact Psych: euthymic mood, full affect   EKG:  EKG is  Not ordered today.   Recent Labs: 09/02/2019: Pro B Natriuretic peptide (BNP) 166.0 11/06/2019: ALT 19 11/13/2019: BUN 19; Creatinine, Ser 0.89; Potassium 4.4; Sodium 138 02/19/2020: Hemoglobin 14.0; Platelets 260.0   Dated 02/22/19: A1c 5.8%. cholesterol 176, triglycerides 52, HDL 93, LDL 62. CMET, CBC, TSH normal Dated 08/22/19: A1c 5.9%.  Dated 11/06/19: CMET normal. Dated 11/25/19: hgb 11.5.    Lipid Panel    Component Value Date/Time   CHOL  12/25/2008 0550    161        ATP III CLASSIFICATION:  <200     mg/dL   Desirable  200-239  mg/dL   Borderline High  >=240    mg/dL   High          TRIG 39 12/25/2008 0550   HDL 102 12/25/2008 0550   CHOLHDL 1.6 12/25/2008 0550   VLDL 8 12/25/2008 0550   LDLCALC  12/25/2008 0550    51        Total Cholesterol/HDL:CHD Risk Coronary Heart Disease Risk Table                     Men   Women  1/2 Average Risk   3.4   3.3  Average Risk       5.0   4.4  2 X Average Risk   9.6   7.1  3 X Average Risk  23.4   11.0        Use the calculated Patient Ratio above and the CHD Risk Table to determine the patient's CHD Risk.        ATP III CLASSIFICATION (LDL):  <100     mg/dL   Optimal  100-129  mg/dL   Near or Above                    Optimal  130-159  mg/dL   Borderline  160-189  mg/dL   High  >190     mg/dL   Very High      Wt Readings from Last 3 Encounters:  01/02/20 180 lb 6 oz (81.8 kg)  12/19/19 180 lb (81.6 kg)  12/05/19 180 lb (81.6 kg)      Other studies Reviewed: Additional studies/ records that were reviewed today include:   NST 02/2017: IMPRESSION: 1. Fixed defect in the inferolateral wall, likely old infarct. There appears to be some mild peri-infarct ischemia in the adjacent lateral wall.  2. Decreased wall motion and  thickening in the inferolateral wall.  3. Left ventricular ejection fraction 51%  4. Non invasive risk stratification*: High   Echocardiogram 02/2017: - Left ventricle: The cavity size was normal. There was mild  concentric hypertrophy. Systolic function was mildly reduced. The  estimated ejection fraction was in the range of 45% to 50%.  Hypokinesis and scarring of the basal-midinferolateral and  inferior myocardium; consistent with infarction in the  distribution of the right coronary or left circumflex coronary  artery. Doppler parameters are consistent with abnormal left  ventricular relaxation (grade 1 diastolic dysfunction).  - Mitral valve: Calcified annulus.   Echo 03/20/19: IMPRESSIONS    1. Left ventricular ejection fraction, by estimation, is 60 to 65%. The  left ventricle has normal function. The left ventricle has no regional  wall motion abnormalities. Left ventricular diastolic parameters are  consistent with Grade I diastolic  dysfunction (impaired relaxation). The average left ventricular global  longitudinal strain is -19.4 %.  2. Right ventricular systolic function is normal. The right ventricular  size is normal. There is mildly elevated pulmonary artery systolic  pressure.  3. Left atrial size was severely dilated.  4. The mitral valve is normal in structure and function. Mild mitral  valve regurgitation. No evidence of mitral stenosis.  5. The aortic valve is normal in structure and function. Aortic valve  regurgitation is not visualized. No aortic stenosis is present.  6. The inferior vena cava is normal in size with greater than 50%  respiratory variability, suggesting right atrial pressure of 3 mmHg.   LE arterial  dopplers 05/20/19: Summary:  Right: Resting right ankle-brachial index indicates moderate right lower  extremity arterial disease. The right toe-brachial index is abnormal.   Left: Resting left ankle-brachial index  indicates moderate left lower  extremity arterial disease. The left toe-brachial index is abnormal.   ASSESSMENT AND PLAN:  1. Acute on chronic combined CHF: EF 45-50% in 2019. Recent Echo in March showed normal LV function with EF 55-60%. Not on BBlocker due to baseline bradycardia.  - Continue lasix 20mg  daily PRN swelling.  - appears euvolemic today.   2. HTN: BP improved today - will continue current regimen.  3. CAD s/p MI with PCI to RCA in 2000:  - she is asymptomatic.  - Continue aspirin and statin  4. Carotid artery stenosis: s/p R CEA 1995.  -  repeat dopplers in June showed patent RCEA site. Increased velocity in left common carotid compared to prior. - Continue aspirin and statin - [facility] did CTA showing moderate stenosis in R common carotid at proximal patch site. Plans to follow serially with doppler.    5. PAD: moderate bilateral fem-pop disease noted on last ABI 02/2018.  -  no change by recent dopplers in June.  - Continue aspirin and statin  6. HLD: - Continue caduet (atorvastatin 20mg  daily). Lipids are at goal.   Current medicines are reviewed at length with the patient today.  The patient does not have concerns regarding medicines.  The following changes have been made:  As above  Labs/ tests ordered today include:   No orders of the defined types were placed in this encounter.    Disposition:   FU 6 months  Signed, Dawnetta Copenhaver [place], MD  05/22/2020 2:52 PM

## 2020-05-26 ENCOUNTER — Other Ambulatory Visit: Payer: Self-pay

## 2020-05-26 DIAGNOSIS — I6523 Occlusion and stenosis of bilateral carotid arteries: Secondary | ICD-10-CM

## 2020-05-28 ENCOUNTER — Emergency Department (HOSPITAL_COMMUNITY): Payer: Medicare Other

## 2020-05-28 ENCOUNTER — Inpatient Hospital Stay (HOSPITAL_COMMUNITY)
Admission: EM | Admit: 2020-05-28 | Discharge: 2020-05-31 | DRG: 291 | Disposition: A | Discharge status: Home / Self Care | Payer: Medicare Other | Attending: Internal Medicine | Admitting: Internal Medicine

## 2020-05-28 ENCOUNTER — Other Ambulatory Visit: Payer: Self-pay

## 2020-05-28 ENCOUNTER — Encounter (HOSPITAL_COMMUNITY): Payer: Self-pay | Admitting: Emergency Medicine

## 2020-05-28 ENCOUNTER — Ambulatory Visit: Payer: Medicare Other | Admitting: Cardiology

## 2020-05-28 DIAGNOSIS — Z9981 Dependence on supplemental oxygen: Secondary | ICD-10-CM

## 2020-05-28 DIAGNOSIS — J9621 Acute and chronic respiratory failure with hypoxia: Secondary | ICD-10-CM | POA: Diagnosis present

## 2020-05-28 DIAGNOSIS — Z955 Presence of coronary angioplasty implant and graft: Secondary | ICD-10-CM

## 2020-05-28 DIAGNOSIS — M479 Spondylosis, unspecified: Secondary | ICD-10-CM | POA: Diagnosis present

## 2020-05-28 DIAGNOSIS — G8929 Other chronic pain: Secondary | ICD-10-CM | POA: Diagnosis present

## 2020-05-28 DIAGNOSIS — Z66 Do not resuscitate: Secondary | ICD-10-CM | POA: Diagnosis present

## 2020-05-28 DIAGNOSIS — Z905 Acquired absence of kidney: Secondary | ICD-10-CM

## 2020-05-28 DIAGNOSIS — F32A Depression, unspecified: Secondary | ICD-10-CM

## 2020-05-28 DIAGNOSIS — M810 Age-related osteoporosis without current pathological fracture: Secondary | ICD-10-CM | POA: Diagnosis present

## 2020-05-28 DIAGNOSIS — Z7951 Long term (current) use of inhaled steroids: Secondary | ICD-10-CM

## 2020-05-28 DIAGNOSIS — M419 Scoliosis, unspecified: Secondary | ICD-10-CM | POA: Diagnosis present

## 2020-05-28 DIAGNOSIS — Z20822 Contact with and (suspected) exposure to covid-19: Secondary | ICD-10-CM | POA: Diagnosis present

## 2020-05-28 DIAGNOSIS — F319 Bipolar disorder, unspecified: Secondary | ICD-10-CM | POA: Diagnosis present

## 2020-05-28 DIAGNOSIS — R230 Cyanosis: Secondary | ICD-10-CM | POA: Diagnosis not present

## 2020-05-28 DIAGNOSIS — I110 Hypertensive heart disease with heart failure: Principal | ICD-10-CM | POA: Diagnosis present

## 2020-05-28 DIAGNOSIS — I2510 Atherosclerotic heart disease of native coronary artery without angina pectoris: Secondary | ICD-10-CM | POA: Diagnosis present

## 2020-05-28 DIAGNOSIS — Z888 Allergy status to other drugs, medicaments and biological substances status: Secondary | ICD-10-CM

## 2020-05-28 DIAGNOSIS — J439 Emphysema, unspecified: Secondary | ICD-10-CM | POA: Diagnosis present

## 2020-05-28 DIAGNOSIS — Z8249 Family history of ischemic heart disease and other diseases of the circulatory system: Secondary | ICD-10-CM

## 2020-05-28 DIAGNOSIS — Z87891 Personal history of nicotine dependence: Secondary | ICD-10-CM

## 2020-05-28 DIAGNOSIS — F419 Anxiety disorder, unspecified: Secondary | ICD-10-CM | POA: Diagnosis present

## 2020-05-28 DIAGNOSIS — R0603 Acute respiratory distress: Secondary | ICD-10-CM | POA: Diagnosis present

## 2020-05-28 DIAGNOSIS — Z7982 Long term (current) use of aspirin: Secondary | ICD-10-CM | POA: Diagnosis not present

## 2020-05-28 DIAGNOSIS — R0602 Shortness of breath: Secondary | ICD-10-CM | POA: Diagnosis not present

## 2020-05-28 DIAGNOSIS — G40909 Epilepsy, unspecified, not intractable, without status epilepticus: Secondary | ICD-10-CM | POA: Diagnosis present

## 2020-05-28 DIAGNOSIS — R0902 Hypoxemia: Secondary | ICD-10-CM | POA: Diagnosis not present

## 2020-05-28 DIAGNOSIS — E785 Hyperlipidemia, unspecified: Secondary | ICD-10-CM | POA: Diagnosis present

## 2020-05-28 DIAGNOSIS — I739 Peripheral vascular disease, unspecified: Secondary | ICD-10-CM | POA: Diagnosis present

## 2020-05-28 DIAGNOSIS — Z8544 Personal history of malignant neoplasm of other female genital organs: Secondary | ICD-10-CM

## 2020-05-28 DIAGNOSIS — I5042 Chronic combined systolic (congestive) and diastolic (congestive) heart failure: Secondary | ICD-10-CM | POA: Diagnosis present

## 2020-05-28 DIAGNOSIS — K219 Gastro-esophageal reflux disease without esophagitis: Secondary | ICD-10-CM | POA: Diagnosis present

## 2020-05-28 DIAGNOSIS — I252 Old myocardial infarction: Secondary | ICD-10-CM | POA: Diagnosis not present

## 2020-05-28 DIAGNOSIS — T380X5A Adverse effect of glucocorticoids and synthetic analogues, initial encounter: Secondary | ICD-10-CM | POA: Diagnosis present

## 2020-05-28 DIAGNOSIS — Z85528 Personal history of other malignant neoplasm of kidney: Secondary | ICD-10-CM

## 2020-05-28 DIAGNOSIS — Z923 Personal history of irradiation: Secondary | ICD-10-CM

## 2020-05-28 DIAGNOSIS — I10 Essential (primary) hypertension: Secondary | ICD-10-CM | POA: Diagnosis present

## 2020-05-28 DIAGNOSIS — J441 Chronic obstructive pulmonary disease with (acute) exacerbation: Secondary | ICD-10-CM | POA: Diagnosis present

## 2020-05-28 DIAGNOSIS — M549 Dorsalgia, unspecified: Secondary | ICD-10-CM | POA: Diagnosis present

## 2020-05-28 DIAGNOSIS — Z79899 Other long term (current) drug therapy: Secondary | ICD-10-CM

## 2020-05-28 DIAGNOSIS — I5043 Acute on chronic combined systolic (congestive) and diastolic (congestive) heart failure: Secondary | ICD-10-CM | POA: Diagnosis present

## 2020-05-28 DIAGNOSIS — I5033 Acute on chronic diastolic (congestive) heart failure: Secondary | ICD-10-CM | POA: Diagnosis not present

## 2020-05-28 DIAGNOSIS — Z881 Allergy status to other antibiotic agents status: Secondary | ICD-10-CM

## 2020-05-28 DIAGNOSIS — J9601 Acute respiratory failure with hypoxia: Secondary | ICD-10-CM | POA: Diagnosis present

## 2020-05-28 DIAGNOSIS — R61 Generalized hyperhidrosis: Secondary | ICD-10-CM | POA: Diagnosis not present

## 2020-05-28 DIAGNOSIS — I779 Disorder of arteries and arterioles, unspecified: Secondary | ICD-10-CM | POA: Diagnosis present

## 2020-05-28 DIAGNOSIS — I517 Cardiomegaly: Secondary | ICD-10-CM | POA: Diagnosis not present

## 2020-05-28 LAB — URINALYSIS, ROUTINE W REFLEX MICROSCOPIC
Bilirubin Urine: NEGATIVE
Glucose, UA: NEGATIVE mg/dL
Hgb urine dipstick: NEGATIVE
Ketones, ur: NEGATIVE mg/dL
Leukocytes,Ua: NEGATIVE
Nitrite: NEGATIVE
Protein, ur: NEGATIVE mg/dL
Specific Gravity, Urine: 1.006 (ref 1.005–1.030)
pH: 6 (ref 5.0–8.0)

## 2020-05-28 LAB — CBC WITH DIFFERENTIAL/PLATELET
Abs Immature Granulocytes: 0.11 10*3/uL — ABNORMAL HIGH (ref 0.00–0.07)
Basophils Absolute: 0.1 10*3/uL (ref 0.0–0.1)
Basophils Relative: 0 %
Eosinophils Absolute: 0.2 10*3/uL (ref 0.0–0.5)
Eosinophils Relative: 2 %
HCT: 47.8 % — ABNORMAL HIGH (ref 36.0–46.0)
Hemoglobin: 15.4 g/dL — ABNORMAL HIGH (ref 12.0–15.0)
Immature Granulocytes: 1 %
Lymphocytes Relative: 8 %
Lymphs Abs: 1.1 10*3/uL (ref 0.7–4.0)
MCH: 31.3 pg (ref 26.0–34.0)
MCHC: 32.2 g/dL (ref 30.0–36.0)
MCV: 97.2 fL (ref 80.0–100.0)
Monocytes Absolute: 0.7 10*3/uL (ref 0.1–1.0)
Monocytes Relative: 5 %
Neutro Abs: 11.5 10*3/uL — ABNORMAL HIGH (ref 1.7–7.7)
Neutrophils Relative %: 84 %
Platelets: 265 10*3/uL (ref 150–400)
RBC: 4.92 MIL/uL (ref 3.87–5.11)
RDW: 14.5 % (ref 11.5–15.5)
WBC: 13.7 10*3/uL — ABNORMAL HIGH (ref 4.0–10.5)
nRBC: 0 % (ref 0.0–0.2)

## 2020-05-28 LAB — COMPREHENSIVE METABOLIC PANEL
ALT: 21 U/L (ref 0–44)
AST: 27 U/L (ref 15–41)
Albumin: 4.2 g/dL (ref 3.5–5.0)
Alkaline Phosphatase: 90 U/L (ref 38–126)
Anion gap: 7 (ref 5–15)
BUN: 19 mg/dL (ref 8–23)
CO2: 29 mmol/L (ref 22–32)
Calcium: 10.6 mg/dL — ABNORMAL HIGH (ref 8.9–10.3)
Chloride: 104 mmol/L (ref 98–111)
Creatinine, Ser: 0.87 mg/dL (ref 0.44–1.00)
GFR, Estimated: >60 mL/min (ref >60)
Glucose, Bld: 118 mg/dL — ABNORMAL HIGH (ref 70–99)
Potassium: 3.8 mmol/L (ref 3.5–5.1)
Sodium: 140 mmol/L (ref 135–145)
Total Bilirubin: 0.5 mg/dL (ref 0.3–1.2)
Total Protein: 7.6 g/dL (ref 6.5–8.1)

## 2020-05-28 LAB — POC SARS CORONAVIRUS 2 AG -  ED: SARSCOV2ONAVIRUS 2 AG: NEGATIVE

## 2020-05-28 LAB — BRAIN NATRIURETIC PEPTIDE: B Natriuretic Peptide: 222.4 pg/mL — ABNORMAL HIGH (ref 0.0–100.0)

## 2020-05-28 MED ORDER — CHLORHEXIDINE GLUCONATE CLOTH 2 % EX PADS
6.0000 | MEDICATED_PAD | Freq: Every day | CUTANEOUS | Status: DC
  Administered 2020-05-30: 6 via TOPICAL

## 2020-05-28 MED ORDER — SERTRALINE HCL 50 MG PO TABS
50.0000 mg | ORAL_TABLET | Freq: Every day | ORAL | Status: DC
  Administered 2020-05-29 – 2020-05-31 (×3): 50 mg via ORAL
  Filled 2020-05-28 (×3): qty 1

## 2020-05-28 MED ORDER — ENOXAPARIN SODIUM 40 MG/0.4ML IJ SOSY
40.0000 mg | PREFILLED_SYRINGE | INTRAMUSCULAR | Status: DC
  Administered 2020-05-28 – 2020-05-30 (×3): 40 mg via SUBCUTANEOUS
  Filled 2020-05-28 (×3): qty 0.4

## 2020-05-28 MED ORDER — POTASSIUM CHLORIDE 20 MEQ PO PACK
20.0000 meq | PACK | Freq: Once | ORAL | Status: AC
  Administered 2020-05-28: 20 meq via ORAL
  Filled 2020-05-28: qty 1

## 2020-05-28 MED ORDER — ALBUTEROL (5 MG/ML) CONTINUOUS INHALATION SOLN
10.0000 mg/h | INHALATION_SOLUTION | RESPIRATORY_TRACT | Status: DC
  Administered 2020-05-28: 10 mg/h via RESPIRATORY_TRACT
  Filled 2020-05-28: qty 20

## 2020-05-28 MED ORDER — BISACODYL 10 MG RE SUPP
10.0000 mg | Freq: Once | RECTAL | Status: AC
  Administered 2020-05-29: 10 mg via RECTAL
  Filled 2020-05-28: qty 1

## 2020-05-28 MED ORDER — AMLODIPINE-ATORVASTATIN 10-20 MG PO TABS: 1.0000 | ORAL_TABLET | Freq: Every day | ORAL | Status: DC

## 2020-05-28 MED ORDER — METHYLPREDNISOLONE SODIUM SUCC 125 MG IJ SOLR
125.0000 mg | INTRAMUSCULAR | Status: AC
  Administered 2020-05-28: 125 mg via INTRAVENOUS
  Filled 2020-05-28: qty 2

## 2020-05-28 MED ORDER — BUDESON-GLYCOPYRROL-FORMOTEROL 160-9-4.8 MCG/ACT IN AERO: 2.0000 | INHALATION_SPRAY | Freq: Two times a day (BID) | RESPIRATORY_TRACT | Status: DC

## 2020-05-28 MED ORDER — IPRATROPIUM-ALBUTEROL 0.5-2.5 (3) MG/3ML IN SOLN: 3.0000 mL | Freq: Three times a day (TID) | RESPIRATORY_TRACT | Status: DC

## 2020-05-28 MED ORDER — ASPIRIN EC 81 MG PO TBEC
81.0000 mg | DELAYED_RELEASE_TABLET | Freq: Every morning | ORAL | Status: DC
  Administered 2020-05-29 – 2020-05-31 (×3): 81 mg via ORAL
  Filled 2020-05-28 (×3): qty 1

## 2020-05-28 MED ORDER — SODIUM CHLORIDE 0.9 % IV SOLN
500.0000 mg | Freq: Once | INTRAVENOUS | Status: AC
  Administered 2020-05-28: 500 mg via INTRAVENOUS
  Filled 2020-05-28: qty 500

## 2020-05-28 MED ORDER — MONTELUKAST SODIUM 10 MG PO TABS
10.0000 mg | ORAL_TABLET | Freq: Every day | ORAL | Status: DC
  Administered 2020-05-28 – 2020-05-30 (×3): 10 mg via ORAL
  Filled 2020-05-28 (×3): qty 1

## 2020-05-28 MED ORDER — ATORVASTATIN CALCIUM 10 MG PO TABS: 20.0000 mg | ORAL_TABLET | Freq: Every day | ORAL | Status: DC

## 2020-05-28 MED ORDER — ARIPIPRAZOLE 10 MG PO TABS
10.0000 mg | ORAL_TABLET | Freq: Every day | ORAL | Status: DC
  Administered 2020-05-29 – 2020-05-31 (×3): 10 mg via ORAL
  Filled 2020-05-28 (×3): qty 1

## 2020-05-28 MED ORDER — ONDANSETRON HCL 4 MG PO TABS: 4.0000 mg | ORAL_TABLET | Freq: Four times a day (QID) | ORAL | Status: DC | PRN

## 2020-05-28 MED ORDER — LAMOTRIGINE 100 MG PO TABS
200.0000 mg | ORAL_TABLET | Freq: Two times a day (BID) | ORAL | Status: DC
  Administered 2020-05-28 – 2020-05-31 (×6): 200 mg via ORAL
  Filled 2020-05-28 (×6): qty 2

## 2020-05-28 MED ORDER — IPRATROPIUM-ALBUTEROL 0.5-2.5 (3) MG/3ML IN SOLN
3.0000 mL | Freq: Three times a day (TID) | RESPIRATORY_TRACT | Status: DC
  Administered 2020-05-29 – 2020-05-31 (×7): 3 mL via RESPIRATORY_TRACT
  Filled 2020-05-28 (×6): qty 3

## 2020-05-28 MED ORDER — PANTOPRAZOLE SODIUM 40 MG PO TBEC
40.0000 mg | DELAYED_RELEASE_TABLET | Freq: Every day | ORAL | Status: DC
  Administered 2020-05-29 – 2020-05-31 (×3): 40 mg via ORAL
  Filled 2020-05-28 (×3): qty 1

## 2020-05-28 MED ORDER — OXYCODONE-ACETAMINOPHEN 5-325 MG PO TABS
1.0000 | ORAL_TABLET | ORAL | Status: DC | PRN
  Administered 2020-05-28 – 2020-05-31 (×11): 1 via ORAL
  Filled 2020-05-28 (×11): qty 1

## 2020-05-28 MED ORDER — SENNOSIDES-DOCUSATE SODIUM 8.6-50 MG PO TABS
2.0000 | ORAL_TABLET | Freq: Two times a day (BID) | ORAL | Status: DC
  Administered 2020-05-29 (×3): 2 via ORAL
  Filled 2020-05-28 (×4): qty 2

## 2020-05-28 MED ORDER — SODIUM CHLORIDE 0.9% FLUSH
3.0000 mL | Freq: Two times a day (BID) | INTRAVENOUS | Status: DC
  Administered 2020-05-28 – 2020-05-31 (×5): 3 mL via INTRAVENOUS

## 2020-05-28 MED ORDER — METHYLPREDNISOLONE SODIUM SUCC 40 MG IJ SOLR
40.0000 mg | Freq: Two times a day (BID) | INTRAMUSCULAR | Status: DC
  Administered 2020-05-29 – 2020-05-30 (×3): 40 mg via INTRAVENOUS
  Filled 2020-05-28 (×3): qty 1

## 2020-05-28 MED ORDER — ACETAMINOPHEN 650 MG RE SUPP: 650.0000 mg | Freq: Four times a day (QID) | RECTAL | Status: DC | PRN

## 2020-05-28 MED ORDER — SENNOSIDES-DOCUSATE SODIUM 8.6-50 MG PO TABS: 1.0000 | ORAL_TABLET | Freq: Every evening | ORAL | Status: DC | PRN

## 2020-05-28 MED ORDER — OXYCODONE-ACETAMINOPHEN 10-325 MG PO TABS: 1.0000 | ORAL_TABLET | ORAL | Status: DC | PRN

## 2020-05-28 MED ORDER — ALBUTEROL SULFATE (2.5 MG/3ML) 0.083% IN NEBU: 2.5000 mg | INHALATION_SOLUTION | RESPIRATORY_TRACT | Status: DC | PRN

## 2020-05-28 MED ORDER — ONDANSETRON HCL 4 MG/2ML IJ SOLN: 4.0000 mg | Freq: Four times a day (QID) | INTRAMUSCULAR | Status: DC | PRN

## 2020-05-28 MED ORDER — FUROSEMIDE 10 MG/ML IJ SOLN
40.0000 mg | Freq: Once | INTRAMUSCULAR | Status: AC
  Administered 2020-05-28: 40 mg via INTRAVENOUS
  Filled 2020-05-28: qty 4

## 2020-05-28 MED ORDER — ORAL CARE MOUTH RINSE
15.0000 mL | Freq: Two times a day (BID) | OROMUCOSAL | Status: DC
  Administered 2020-05-29 – 2020-05-31 (×6): 15 mL via OROMUCOSAL

## 2020-05-28 MED ORDER — AMLODIPINE BESYLATE 10 MG PO TABS: 10.0000 mg | ORAL_TABLET | Freq: Every day | ORAL | Status: DC

## 2020-05-28 MED ORDER — OXYCODONE HCL 5 MG PO TABS
5.0000 mg | ORAL_TABLET | ORAL | Status: DC | PRN
Start: 2020-05-28 — End: 2020-05-31
  Administered 2020-05-29 – 2020-05-31 (×10): 5 mg via ORAL
  Filled 2020-05-28 (×10): qty 1

## 2020-05-28 MED ORDER — IRBESARTAN 300 MG PO TABS: 300.0000 mg | ORAL_TABLET | Freq: Every day | ORAL | Status: DC

## 2020-05-28 MED ORDER — SODIUM CHLORIDE 0.9 % IV SOLN
1.0000 g | Freq: Once | INTRAVENOUS | Status: AC
  Administered 2020-05-28: 1 g via INTRAVENOUS
  Filled 2020-05-28: qty 10

## 2020-05-28 MED ORDER — POLYETHYLENE GLYCOL 3350 17 G PO PACK
17.0000 g | PACK | Freq: Every day | ORAL | Status: DC | PRN
  Filled 2020-05-28: qty 1

## 2020-05-28 MED ORDER — FUROSEMIDE 10 MG/ML IJ SOLN
40.0000 mg | Freq: Once | INTRAMUSCULAR | Status: AC
  Administered 2020-05-29: 40 mg via INTRAVENOUS
  Filled 2020-05-28: qty 4

## 2020-05-28 MED ORDER — ACETAMINOPHEN 325 MG PO TABS
650.0000 mg | ORAL_TABLET | Freq: Four times a day (QID) | ORAL | Status: DC | PRN
  Administered 2020-05-29 – 2020-05-30 (×3): 650 mg via ORAL
  Filled 2020-05-28 (×3): qty 2

## 2020-05-28 NOTE — ED Triage Notes (Signed)
75 yo female BIBA c/o respiratory distress. Pt has history of COPD with intermittent use of home O2 per pt. Pt states difficulty breathing started today. On arrival EMS states they were only able to get pt spo2 up to 84% on NRB Ems gave a duoneb treatment 5 mg albuterol , 0.5 atrovent, 5 mg albuterol, 125 mg solumedrol, and mag sulfate 2 g.   Vitals: bp 174/86 Hr 90 rr 24    Upon pt arrival to ED pts spo2 90% on RA after 10 min on 3 L pt trending at 74%. O2 titrated up to 10 L, pt currently satting at 80%

## 2020-05-28 NOTE — ED Provider Notes (Signed)
[facility] DEPT Provider Note   CSN: [id number] Arrival date & time: 05/28/20  [place]     History Chief Complaint  Patient presents with  . Respiratory Distress    [name] is a 75 y.o. female.  HPI Patient with a history of emphysema presents via EMS due to concern of dyspnea.  No pain, no clear precipitant.  Onset was about 2 days ago, and EMS notes that on arrival patient was hypoxic, saturation of 80% on room air.  She does use oxygen, though only as needed at home.  With nonrebreather mask patient proved to 95%.  In route she received bronchodilator as well.  No reported fever, hypotension.  Patient resolved states that beyond fatigue and dyspnea she feels okay.    Past Medical History:  Diagnosis Date  . Alcoholism ([place])    recovering since 2000  . Anxiety and depression   . Arthritis BACK  . Bipolar disorder ([place])   . Blepharospasm LEFT EYE  . CHF exacerbation ([place]) 02/24/2017  . Chronic back pain   . COPD (chronic obstructive pulmonary disease) ([place])   . Coronary artery disease CARDIOLOGIST- DR Martinique--- LAST VISIT NOTE 09-07-2009  W/ CHART  . Emphysema   . GERD (gastroesophageal reflux disease)   . History of alcohol abuse RECOVERING SINCE 2000  . History of Left renal mass 03/02/2012   underwent partial nephrectomy  . HTN (hypertension)   . Idiopathic acute facial nerve palsy LEFT SIDE--  BOTOX THERAPY  . Inferior MI ([place]) 2000--  POST PTCA W/ STENT X1  . Osteoporosis   . Other and unspecified general anesthetics causing adverse effect in therapeutic use post op delirium--  last anes record w/ chart  from   09-22-2009 (spinal w/ light sedation)  . Peripheral vascular disease ([place]) POST RIGHT CAROTID SURG.  1995  . Renal cell carcinoma ([place]) 07/09/12   Left mass  . Rosacea LEFT FACIAL RASH  . S/P radiation therapy 02/21/2012   38.75 Gy HDR 5 Fractions- vaginal cuff  . Scoliosis   . Seizures ([place])    x 1 after abrupt  discontinuation of  Clonidine  . Status post carotid endarterectomy RIGHT --  1995  . Status post primary angioplasty with coronary stent 2000--  POST INFERIOR MI  . Unstable balance WALKS W/ CANE  . Vaginal cancer [facility])     Patient Active Problem List   Diagnosis Date Noted  . GERD (gastroesophageal reflux disease) 07/02/2019  . Chronic diastolic CHF (congestive heart failure) ([place]) 03/07/2019  . Atherosclerosis of native artery of both lower extremities with intermittent claudication ([place]) 03/30/2017  . CHF exacerbation ([place]) 02/24/2017  . Chest pain 02/24/2017  . Acute congestive heart failure ([place]) 02/24/2017  . Preoperative clearance 06/13/2012  . Unspecified gastritis and gastroduodenitis without mention of hemorrhage 03/05/2012  . Hypoxia 03/04/2012  . Former smoker 03/04/2012  . COPD (chronic obstructive pulmonary disease) ([place]) 03/04/2012  . Abdominal pain, acute, epigastric 03/02/2012  . Abnormal abdominal CT scan 03/02/2012  . Hypertension   . Depression   . Alcoholism ([place])   . Bipolar disorder ([place])   . Status post carotid endarterectomy   . Arthritis   . Idiopathic acute facial nerve palsy   . Blepharospasm   . Anxiety   . Vaginal cancer ([place])   . Carotid artery disease ([place]) 06/28/2011  . Coronary artery disease   . Inferior MI ([place])   . Peripheral vascular disease ([place])   . HTN (hypertension)   .  Dysplasia of vagina, histologically confirmed 12/14/2010    Past Surgical History:  Procedure Laterality Date  . BREAST EXCISIONAL BIOPSY Left 2019   b9 axilla Bx X 3  . CAROTID ENDARTERECTOMY  1995   RIGHT  . CATARACT EXTRACTION W/ INTRAOCULAR LENS  IMPLANT, BILATERAL    . CERVICAL CONIZATION W/BX  09-23-2008  . CORONARY ANGIOPLASTY WITH STENT PLACEMENT  2000-   INFERIOR MI   X1 STENT TO RCA  . EUS N/A 03/05/2012   Procedure: FULL UPPER ENDOSCOPIC ULTRASOUND (EUS) RADIAL and EGD;  Surgeon: Milus Banister, MD;  Location: [facility] ENDOSCOPY;  Service: Endoscopy;   Laterality: N/A;  ercp scope first than eus scope  . HEMIARTHROPLASTY HIP  12-26-2008   LEFT FEMORAL NECK FX  . ORIF HIP FRACTURE  02-13-2007   RIGHT FEMORAL NECK FX  . ORIF RIGHT DISTAL RADIUS AND RIGHT PROXIMAL HUMEROUS NECK FX'S  10-10-2005  . RIGHT SHOULDER SURG.  2007  . ROBOTIC ASSITED PARTIAL NEPHRECTOMY Left 07/09/2012   Procedure: ROBOTIC ASSITED PARTIAL NEPHRECTOMY;  Surgeon: Dutch Gray, MD;  Location: [facility] ORS;  Service: Urology;  Laterality: Left;  . TOTAL HIP ARTHROPLASTY  04-15-2008   POST FAILED  RIGHT HIP ORIF FEMORAL FX  . TOTAL KNEE ARTHROPLASTY  09-22-2009   RIGHT  . UPPER RIGHT VAGINAL REGION  12/28/11   BIOPSY: SQUAMOUS CELL CARCINOMA  . VAGINAL HYSTERECTOMY  07/06/2009   Secondary to dysplasia     OB History   No obstetric history on file.     Family History  Problem Relation Age of Onset  . Hypertension Mother   . Hypertension Father   . Cancer Maternal Grandfather        type unknown  . Hypertension Sister     Social History   Tobacco Use  . Smoking status: Former Smoker    Packs/day: 2.00    Years: 50.00    Pack years: 100.00    Types: Cigarettes    Quit date: 01/17/2010    Years since quitting: 10.3  . Smokeless tobacco: Never Used  . Tobacco comment: STATES QUIT SMOKING 01-17-2010  Vaping Use  . Vaping Use: Never used  Substance Use Topics  . Alcohol use: Not Currently    Comment: RECOVERING ALCOHOLIC--   QUIT IN [id number]  . Drug use: No    Home Medications Prior to Admission medications   Medication Sig Start Date End Date Taking? Authorizing Provider  albuterol (PROVENTIL HFA;VENTOLIN HFA) 108 (90 BASE) MCG/ACT inhaler Inhale 2 puffs into the lungs every 6 (six) hours as needed for wheezing.    [provider]  ARIPiprazole (ABILIFY) 20 MG tablet Take 10 mg by mouth daily.     [provider]  aspirin EC 81 MG tablet Take 81 mg by mouth every morning.     [provider]  Budeson-Glycopyrrol-Formoterol (BREZTRI  AEROSPHERE) 160-9-4.8 MCG/ACT AERO Inhale 2 puffs into the lungs in the morning and at bedtime. 01/08/20   Icard, Octavio Graves, DO  budesonide-formoterol (SYMBICORT) 160-4.5 MCG/ACT inhaler Inhale 2 puffs into the lungs 2 (two) times daily.    [provider]  CADUET 10-20 MG tablet TAKE 1 TABLET BY MOUTH DAILY. 04/28/20   Martinique, Peter M, MD  celecoxib (CELEBREX) 200 MG capsule Take 200 mg by mouth daily as needed for mild pain.     [provider]  Cholecalciferol (VITAMIN D3) 2000 UNITS TABS Take 2,000 Units by mouth daily.     [provider]  dexlansoprazole Ross Marcus)  60 MG capsule Take 60 mg by mouth daily.    [provider]  docusate sodium (COLACE) 100 MG capsule Take 100 mg by mouth as directed.     [provider]  furosemide (LASIX) 20 MG tablet Take 20 mg by mouth daily as needed for fluid.     [provider]  hydrALAZINE (APRESOLINE) 25 MG tablet Take 1 tablet (25 mg total) by mouth 2 (two) times daily. 06/28/19 11/13/19  Martinique, Peter M, MD  ipratropium-albuterol (DUONEB) 0.5-2.5 (3) MG/3ML SOLN Take 3 mLs by nebulization in the morning and at bedtime. 10/28/19   Julian Hy, DO  lamoTRIgine (LAMICTAL) 100 MG tablet Take 200 mg by mouth 2 (two) times daily.    [provider]  montelukast (SINGULAIR) 10 MG tablet TAKE 1 TABLET BY MOUTH EVERYDAY AT BEDTIME 04/13/20   Icard, Bradley L, DO  Multiple Vitamin (MULTIVITAMIN) capsule Take 1 capsule by mouth daily.     [provider]  nitroGLYCERIN (NITROSTAT) 0.4 MG SL tablet Place 1 tablet (0.4 mg total) under the tongue every 5 (five) minutes as needed for chest pain. 02/28/19   Kroeger, Lorelee Cover., PA-C  oxyCODONE-acetaminophen (PERCOCET) 10-325 MG per tablet Take 1 tablet by mouth every 4 (four) hours as needed for pain.     [provider]  polyethylene glycol (MIRALAX / GLYCOLAX) packet Take 17 g by mouth daily as needed for mild constipation or moderate  constipation.     [provider]  potassium chloride (KLOR-CON) 8 MEQ tablet Take 8 mEq by mouth daily.     [provider]  Propylene Glycol (SYSTANE BALANCE OP) Place 1 drop into both eyes daily as needed (dry eyes).    [provider]  sertraline (ZOLOFT) 50 MG tablet Take 50 mg by mouth daily.    [provider]  Tiotropium Bromide Monohydrate (SPIRIVA RESPIMAT) 1.25 MCG/ACT AERS Inhale 2 puffs into the lungs at bedtime.    [provider]  tiZANidine (ZANAFLEX) 4 MG tablet Take 4 mg by mouth 4 (four) times daily as needed for muscle spasms.     [provider]  valsartan (DIOVAN) 320 MG tablet TAKE 1 TABLET BY MOUTH EVERY DAY 04/24/20   Martinique, Peter M, MD    Allergies    Doxycycline and Gabapentin  Review of Systems   Review of Systems  Constitutional:       Per HPI, otherwise negative  HENT:       Per HPI, otherwise negative  Respiratory:       Per HPI, otherwise negative  Cardiovascular:       Per HPI, otherwise negative  Gastrointestinal: Negative for vomiting.  Endocrine:       Negative aside from HPI  Genitourinary:       Neg aside from HPI   Musculoskeletal:       Per HPI, otherwise negative  Skin: Negative.   Neurological: Negative for syncope.    Physical Exam Updated Vital Signs BP 139/70   Pulse 96   Temp 98.2 F (36.8 C) (Oral)   Resp 19   Ht 5\' 3"  (1.6 m)   Wt 81.6 kg   SpO2 92%   BMI 31.89 kg/m   Physical Exam Vitals and nursing note reviewed.  Constitutional:      General: She is not in acute distress.    Appearance: She is well-developed. She is ill-appearing.  HENT:     Head: Normocephalic and atraumatic.  Eyes:  Conjunctiva/sclera: Conjunctivae normal.  Cardiovascular:     Rate and Rhythm: Regular rhythm. Tachycardia present.  Pulmonary:     Effort: Tachypnea and accessory muscle usage present.  Abdominal:     General: There is no distension.  Skin:    General: Skin is warm and  dry.  Neurological:     Mental Status: She is alert and oriented to person, place, and time.     Cranial Nerves: No cranial nerve deficit.      ED Results / Procedures / Treatments   Labs (all labs ordered are listed, but only abnormal results are displayed) Labs Reviewed  COMPREHENSIVE METABOLIC PANEL - Abnormal; Notable for the following components:      Result Value   Glucose, Bld 118 (*)    Calcium 10.6 (*)    All other components within normal limits  CBC WITH DIFFERENTIAL/PLATELET - Abnormal; Notable for the following components:   WBC 13.7 (*)    Hemoglobin 15.4 (*)    HCT 47.8 (*)    Neutro Abs 11.5 (*)    Abs Immature Granulocytes 0.11 (*)    All other components within normal limits  BRAIN NATRIURETIC PEPTIDE - Abnormal; Notable for the following components:   B Natriuretic Peptide 222.4 (*)    All other components within normal limits  URINALYSIS, ROUTINE W REFLEX MICROSCOPIC - Abnormal; Notable for the following components:   Color, Urine STRAW (*)    All other components within normal limits  POC SARS CORONAVIRUS 2 AG -  ED    EKG EKG Interpretation  Date/Time:  Thursday May 28 2020 18:59:54 EDT Ventricular Rate:  95 PR Interval:  166 QRS Duration: 112 QT Interval:  335 QTC Calculation: 422 R Axis:   -34 Text Interpretation: Sinus rhythm Borderline IVCD with LAD Inferior infarct, old Baseline wander Abnormal ECG Confirmed by Gerhard MunchLockwood, Pariss Hommes [id number]) on 05/28/2020 7:08:42 PM   Radiology DG Chest Port 1 View  Result Date: 05/28/2020 CLINICAL DATA:  Shortness of breath. EXAM: PORTABLE CHEST 1 VIEW COMPARISON:  September 02, 2019 FINDINGS: Cardiomegaly. Bilateral pulmonary opacities are identified, relatively symmetric. The hila and mediastinum are unremarkable. No pneumothorax. No other abnormalities. IMPRESSION: Findings are most consistent with cardiomegaly and pulmonary edema. Bilateral pneumonia is not completely excluded. Recommend clinical correlation.  Electronically Signed   By: Gerome Samavid  Williams III M.D   On: 05/28/2020 19:26    Procedures Procedures   Medications Ordered in ED Medications  albuterol (PROVENTIL,VENTOLIN) solution continuous neb (10 mg/hr Nebulization New Bag/Given 05/28/20 2106)  methylPREDNISolone sodium succinate (SOLU-MEDROL) 125 mg/2 mL injection 125 mg (125 mg Intravenous Given 05/28/20 2100)  furosemide (LASIX) injection 40 mg (40 mg Intravenous Given 05/28/20 2101)    ED Course  I have reviewed the triage vital signs and the nursing notes.  Pertinent labs & imaging results that were available during my care of the patient were reviewed by me and considered in my medical decision making (see chart for details).   Pulse oximetry 88% room air abnormal Cardiac 105 sinus tach abnormal  On arrival with consideration of respiratory distress patient was placed on continuous monitoring, received additional bronco dilators.  9:29 PM Patient smiling, though she is receiving continuous neb via nonrebreather mask.  Saturation now 93% with 10 L.  However, she states that she feels better, is giving a thumbs up.  Increased work of breathing has actually diminished since arrival.  Findings discussed, reviewed, notable for fluid overload status, and the patient's presentation for respiratory distress is  likely multifactorial with consideration of COPD/CHF exacerbation. Patient has received IV Lasix, Solu-Medrol, breathing treatment, including continuous as above. She is COVID-negative, there is suspicion for COPD/CHF as above, lower suspicion for bacterial pneumonia given the absence of fever, however, with COPD she will receive prophylactic antibiotics as well.  Patient admitted for monitoring, management.  MDM Rules/Calculators/A&P MDM Number of Diagnoses or Management Options Respiratory distress: new, needed workup   Amount and/or Complexity of Data Reviewed Clinical lab tests: ordered and reviewed Tests in the radiology  section of CPT: ordered and reviewed Tests in the medicine section of CPT: reviewed and ordered Decide to obtain previous medical records or to obtain history from someone other than the patient: yes Review and summarize past medical records: yes Discuss the patient with other providers: yes Independent visualization of images, tracings, or specimens: yes  Risk of Complications, Morbidity, and/or Mortality Presenting problems: high Diagnostic procedures: high Management options: high  Critical Care Total time providing critical care: 30-74 minutes (35)  Patient Progress Patient progress: improved  Final Clinical Impression(s) / ED Diagnoses Final diagnoses:  Respiratory distress     Carmin Muskrat, MD 05/28/20 2131

## 2020-05-28 NOTE — ED Notes (Signed)
Admitting Provider at bedside. 

## 2020-05-28 NOTE — H&P (Signed)
History and Physical    [name] WPY:[id number] DOB: [birth year] DOA: 05/28/2020  PCP: Jade Stains, MD  Patient coming from: Home via EMS  I have personally briefly reviewed patient's old medical records in [place]  Chief Complaint: Dyspnea  HPI: [name] is a 75 y.o. female with medical history significant for severe COPD/emphysema, chronic respiratory failure with hypoxia on home O2, chronic combined systolic and diastolic CHF (last EF improved to the 60-65% 03/2019), CAD s/p inferior MI with PCI to RCA in 2000, carotid artery stenosis s/p Rt CEA 1995, PAD, renal cancer s/p partial left nephrectomy 2014, history of seizures, HTN, HLD, bipolar/depression/anxiety who presents to the ED for evaluation of dyspnea.  Patient states she was walking to her couch earlier today when she had sudden onset of dyspnea with inability to catch her breath.  She tried using her home oxygen without significant improvement therefore she called EMS for further evaluation.  She has had associated productive cough.  She denies any chest pain.  She usually takes Lasix as needed for lower extremity swelling but has not seen any edema in her legs.  She says that she cannot lay flat due to chronic back issues and always uses a recliner when she sleeps.  She otherwise denies any subjective fevers, chills, diaphoresis, nausea, vomiting, abdominal pain, dysuria.  Per ED triage documentation, EMS were only able to get SPO2 up to 84% on NRB.  She was given DuoNeb, albuterol, Atrovent treatments on route to the ED as well as 125 mg IV Solu-Medrol and 2 g magnesium.  ED Course:  Initial vitals showed BP 175/89, pulse 100, RR 19, temp 98.2 F.  Per ED triage notes, SPO2 90% initially on room air.  Patient placed on 3 L O2 via [place] and after 10 minutes SPO2 down to 74%.  Then placed on 10 L O2 via [place] with SPO2 80%.  Now saturating 93-96% on NRB.  Labs show WBC 13.7, hemoglobin 15.4, platelets 265,000,  sodium 140, potassium 3.8, bicarb 29, BUN 19, creatinine 0.87, serum glucose 118, LFTs within normal limits, BNP 222.4.  Urinalysis is negative for UTI.  SARS-CoV-2 antigen is negative.  Portable chest x-ray shows enlarged cardiac silhouette with pulmonary edema.  Patient was given IV Solu-Medrol 125 mg, IV Lasix 40 mg, continuous albuterol nebulizer, and started on IV ceftriaxone and azithromycin.  The hospitalist service was consulted to admit for further evaluation and management.  Review of Systems: All systems reviewed and are negative except as documented in history of present illness above.   Past Medical History:  Diagnosis Date  . Alcoholism ([place])    recovering since 2000  . Anxiety and depression   . Arthritis BACK  . Bipolar disorder ([place])   . Blepharospasm LEFT EYE  . CHF exacerbation ([place]) 02/24/2017  . Chronic back pain   . COPD (chronic obstructive pulmonary disease) ([place])   . Coronary artery disease CARDIOLOGIST- [name]--- LAST VISIT NOTE 09-07-2009  W/ CHART  . Emphysema   . GERD (gastroesophageal reflux disease)   . History of alcohol abuse RECOVERING SINCE 2000  . History of Left renal mass 03/02/2012   underwent partial nephrectomy  . HTN (hypertension)   . Idiopathic acute facial nerve palsy LEFT SIDE--  BOTOX THERAPY  . Inferior MI ([place]) 2000--  POST PTCA W/ STENT X1  . Osteoporosis   . Other and unspecified general anesthetics causing adverse effect in therapeutic use post op delirium--  last anes record  w/ chart  from   09-22-2009 (spinal w/ light sedation)  . Peripheral vascular disease ([place]) POST RIGHT CAROTID SURG.  1995  . Renal cell carcinoma ([place]) 07/09/12   Left mass  . Rosacea LEFT FACIAL RASH  . S/P radiation therapy 02/21/2012   38.75 Gy HDR 5 Fractions- vaginal cuff  . Scoliosis   . Seizures ([place])    x 1 after abrupt discontinuation of  Clonidine  . Status post carotid endarterectomy RIGHT --  1995  . Status post primary angioplasty with  coronary stent 2000--  POST INFERIOR MI  . Unstable balance WALKS W/ CANE  . [facility]     Past Surgical History:  Procedure Laterality Date  . BREAST EXCISIONAL BIOPSY Left 2019   b9 axilla Bx X 3  . CAROTID ENDARTERECTOMY  1995   RIGHT  . CATARACT EXTRACTION W/ INTRAOCULAR LENS  IMPLANT, BILATERAL    . CERVICAL CONIZATION W/BX  09-23-2008  . CORONARY ANGIOPLASTY WITH STENT PLACEMENT  2000-   INFERIOR MI   X1 STENT TO RCA  . EUS N/A 03/05/2012   Procedure: FULL UPPER ENDOSCOPIC ULTRASOUND (EUS) RADIAL and EGD;  Surgeon: Jade Banister, MD;  Location: [facility] ENDOSCOPY;  Service: Endoscopy;  Laterality: N/A;  ercp scope first than eus scope  . HEMIARTHROPLASTY HIP  12-26-2008   LEFT FEMORAL NECK FX  . ORIF HIP FRACTURE  02-13-2007   RIGHT FEMORAL NECK FX  . ORIF RIGHT DISTAL RADIUS AND RIGHT PROXIMAL HUMEROUS NECK FX'S  10-10-2005  . RIGHT SHOULDER SURG.  2007  . ROBOTIC ASSITED PARTIAL NEPHRECTOMY Left 07/09/2012   Procedure: ROBOTIC ASSITED PARTIAL NEPHRECTOMY;  Surgeon: Jade Gray, MD;  Location: [facility] ORS;  Service: Urology;  Laterality: Left;  . TOTAL HIP ARTHROPLASTY  04-15-2008   POST FAILED  RIGHT HIP ORIF FEMORAL FX  . TOTAL KNEE ARTHROPLASTY  09-22-2009   RIGHT  . UPPER RIGHT VAGINAL REGION  12/28/11   BIOPSY: SQUAMOUS CELL CARCINOMA  . VAGINAL HYSTERECTOMY  07/06/2009   Secondary to dysplasia    Social History:  reports that she quit smoking about 10 years ago. Her smoking use included cigarettes. She has a 100.00 pack-year smoking history. She has never used smokeless tobacco. She reports previous alcohol use. She reports that she does not use drugs.  Allergies  Allergen Reactions  . Doxycycline Diarrhea  . Gabapentin Other (See Comments)    sleepiness    Family History  Problem Relation Age of Onset  . Hypertension Mother   . Hypertension Father   . Cancer Maternal Grandfather        type unknown  . Hypertension Sister      Prior to Admission  medications   Medication Sig Start Date End Date Taking? Authorizing Provider  albuterol (PROVENTIL HFA;VENTOLIN HFA) 108 (90 BASE) MCG/ACT inhaler Inhale 2 puffs into the lungs every 6 (six) hours as needed for wheezing.    [provider]  ARIPiprazole (ABILIFY) 20 MG tablet Take 10 mg by mouth daily.     [provider]  aspirin EC 81 MG tablet Take 81 mg by mouth every morning.     [provider]  Budeson-Glycopyrrol-Formoterol (BREZTRI AEROSPHERE) 160-9-4.8 MCG/ACT AERO Inhale 2 puffs into the lungs in the morning and at bedtime. 01/08/20   Icard, Octavio Graves, DO  budesonide-formoterol (SYMBICORT) 160-4.5 MCG/ACT inhaler Inhale 2 puffs into the lungs 2 (two) times daily.    [provider]  CADUET 10-20 MG tablet TAKE 1 TABLET  BY MOUTH DAILY. 04/28/20   Martinez, Peter M, MD  celecoxib (CELEBREX) 200 MG capsule Take 200 mg by mouth daily as needed for mild pain.     [provider]  Cholecalciferol (VITAMIN D3) 2000 UNITS TABS Take 2,000 Units by mouth daily.     [provider]  dexlansoprazole (DEXILANT) 60 MG capsule Take 60 mg by mouth daily.    [provider]  docusate sodium (COLACE) 100 MG capsule Take 100 mg by mouth as directed.     [provider]  furosemide (LASIX) 20 MG tablet Take 20 mg by mouth daily as needed for fluid.     [provider]  hydrALAZINE (APRESOLINE) 25 MG tablet Take 1 tablet (25 mg total) by mouth 2 (two) times daily. 06/28/19 11/13/19  Martinez, Peter M, MD  ipratropium-albuterol (DUONEB) 0.5-2.5 (3) MG/3ML SOLN Take 3 mLs by nebulization in the morning and at bedtime. 10/28/19   Julian Hy, DO  lamoTRIgine (LAMICTAL) 100 MG tablet Take 200 mg by mouth 2 (two) times daily.    [provider]  montelukast (SINGULAIR) 10 MG tablet TAKE 1 TABLET BY MOUTH EVERYDAY AT BEDTIME 04/13/20   Icard, Bradley L, DO  Multiple Vitamin (MULTIVITAMIN) capsule Take 1 capsule by mouth daily.      [provider]  nitroGLYCERIN (NITROSTAT) 0.4 MG SL tablet Place 1 tablet (0.4 mg total) under the tongue every 5 (five) minutes as needed for chest pain. 02/28/19   Kroeger, Lorelee Cover., PA-C  oxyCODONE-acetaminophen (PERCOCET) 10-325 MG per tablet Take 1 tablet by mouth every 4 (four) hours as needed for pain.     [provider]  polyethylene glycol (MIRALAX / GLYCOLAX) packet Take 17 g by mouth daily as needed for mild constipation or moderate constipation.     [provider]  potassium chloride (KLOR-CON) 8 MEQ tablet Take 8 mEq by mouth daily.     [provider]  Propylene Glycol (SYSTANE BALANCE OP) Place 1 drop into both eyes daily as needed (dry eyes).    [provider]  sertraline (ZOLOFT) 50 MG tablet Take 50 mg by mouth daily.    [provider]  Tiotropium Bromide Monohydrate (SPIRIVA RESPIMAT) 1.25 MCG/ACT AERS Inhale 2 puffs into the lungs at bedtime.    [provider]  tiZANidine (ZANAFLEX) 4 MG tablet Take 4 mg by mouth 4 (four) times daily as needed for muscle spasms.     [provider]  valsartan (DIOVAN) 320 MG tablet TAKE 1 TABLET BY MOUTH EVERY DAY 04/24/20   Martinez, Peter M, MD    Physical Exam: Vitals:   05/28/20 2130 05/28/20 2145 05/28/20 2200 05/28/20 2215  BP: (!) 157/87 (!) 160/85 122/61 (!) 161/66  Pulse: (!) 103 (!) 103 (!) 103 (!) 111  Resp: 20 17 (!) 21 (!) 21  Temp:      TempSrc:      SpO2: 95% 95% 94% 93%  Weight:      Height:       Constitutional: Resting in bed with head elevated, NAD, calm, comfortable Eyes: PERRL, lids and conjunctivae normal.  Intermittent left eye twitching, this is chronic per patient. ENMT: Mucous membranes are moist. Posterior pharynx clear of any exudate or lesions.Normal dentition.  Neck: normal, supple, no masses. Respiratory: Distant breath sounds with bibasilar inspiratory crackles and faint end expiratory wheezing throughout.  Normal respiratory  effort while on 6 L O2 via [place]. No accessory muscle use.  Cardiovascular: Tachycardic with  systolic murmur. No extremity edema. 2+ pedal pulses. Abdomen: no tenderness, no masses palpated. Musculoskeletal: no clubbing / cyanosis. No joint deformity upper and lower extremities. Good ROM, no contractures. Normal muscle tone.  Skin: no rashes, lesions, ulcers. No induration Neurologic: CN 2-12 grossly intact. Sensation intact. Strength 5/5 in all 4.  Psychiatric: Normal judgment and insight. Alert and oriented x 3. Normal mood.   Labs on Admission: I have personally reviewed following labs and imaging studies  CBC: Recent Labs  Lab 05/28/20 1924  WBC 13.7*  NEUTROABS 11.5*  HGB 15.4*  HCT 47.8*  MCV 97.2  PLT 99991111   Basic Metabolic Panel: Recent Labs  Lab 05/28/20 1924  NA 140  K 3.8  CL 104  CO2 29  GLUCOSE 118*  BUN 19  CREATININE 0.87  CALCIUM 10.6*   GFR: Estimated Creatinine Clearance: 56.5 mL/min (by C-G formula based on SCr of 0.87 mg/dL). Liver Function Tests: Recent Labs  Lab 05/28/20 1924  AST 27  ALT 21  ALKPHOS 90  BILITOT 0.5  PROT 7.6  ALBUMIN 4.2   No results for input(s): LIPASE, AMYLASE in the last 168 hours. No results for input(s): AMMONIA in the last 168 hours. Coagulation Profile: No results for input(s): INR, PROTIME in the last 168 hours. Cardiac Enzymes: No results for input(s): CKTOTAL, CKMB, CKMBINDEX, TROPONINI in the last 168 hours. BNP (last 3 results) Recent Labs    09/02/19 1700  PROBNP 166.0*   HbA1C: No results for input(s): HGBA1C in the last 72 hours. CBG: No results for input(s): GLUCAP in the last 168 hours. Lipid Profile: No results for input(s): CHOL, HDL, LDLCALC, TRIG, CHOLHDL, LDLDIRECT in the last 72 hours. Thyroid Function Tests: No results for input(s): TSH, T4TOTAL, FREET4, T3FREE, THYROIDAB in the last 72 hours. Anemia Panel: No results for input(s): VITAMINB12, FOLATE, FERRITIN, TIBC, IRON, RETICCTPCT in  the last 72 hours. Urine analysis:    Component Value Date/Time   COLORURINE STRAW (A) 05/28/2020 1924   APPEARANCEUR CLEAR 05/28/2020 1924   LABSPEC 1.006 05/28/2020 1924   PHURINE 6.0 05/28/2020 1924   GLUCOSEU NEGATIVE 05/28/2020 1924   HGBUR NEGATIVE 05/28/2020 1924   BILIRUBINUR NEGATIVE 05/28/2020 [place] 05/28/2020 1924   PROTEINUR NEGATIVE 05/28/2020 1924   UROBILINOGEN 0.2 03/07/2012 1234   NITRITE NEGATIVE 05/28/2020 1924   LEUKOCYTESUR NEGATIVE 05/28/2020 1924    Radiological Exams on Admission: DG Chest Port 1 View  Result Date: 05/28/2020 CLINICAL DATA:  Shortness of breath. EXAM: PORTABLE CHEST 1 VIEW COMPARISON:  September 02, 2019 FINDINGS: Cardiomegaly. Bilateral pulmonary opacities are identified, relatively symmetric. The hila and mediastinum are unremarkable. No pneumothorax. No other abnormalities. IMPRESSION: Findings are most consistent with cardiomegaly and pulmonary edema. Bilateral pneumonia is not completely excluded. Recommend clinical correlation. Electronically Signed   By: Dorise Bullion III M.D   On: 05/28/2020 19:26    EKG: Personally reviewed. Sinus rhythm, nonspecific T wave change lead III, borderline IVCD.  Not significantly changed when compared to prior.  Assessment/Plan Principal Problem:   Acute on chronic respiratory failure with hypoxia (HCC) Active Problems:   Coronary artery disease   Carotid artery disease (HCC)   Hypertension   Anxiety and depression   COPD with acute exacerbation (HCC)   Acute on chronic combined systolic and diastolic CHF (congestive heart failure) ([place])   [name] is a 75 y.o. female with medical history significant for severe COPD/emphysema, chronic respiratory failure with hypoxia on home O2, chronic combined  systolic and diastolic CHF (last EF improved to the 60-65% 03/2019), CAD s/p inferior MI with PCI to RCA in 2000, carotid artery stenosis s/p Rt CEA 1995, PAD, renal cancer s/p  partial left nephrectomy 2014, history of seizures, HTN, HLD, bipolar/depression/anxiety who is admitted with acute on chronic respiratory failure with hypoxia.  Acute on chronic respiratory failure with hypoxia due to acute on chronic combined systolic and diastolic CHF/pulmonary edema: History and imaging suggestive of flash pulmonary edema.  Symptoms improving after receiving IV Lasix. -Give additional IV Lasix 40 mg in a.m. -Monitor strict I/O's and daily weights -Update echocardiogram -Additional diuresis based on response -Continue supplemental oxygen as needed and wean as able  COPD with acute exacerbation: Likely triggered by pulmonary edema.  Doubt bacterial pneumonia.  Leukocytosis likely due to IV steroids given with EMS. -Continue scheduled DuoNebs with as needed albuterol -IV Solu-Medrol 40 mg twice daily -Continue home Breztri and Singulair -Wean supplemental O2 as able  CAD s/p PCI to RCA CAS s/p R CEA PAD HLD: Chronic and stable.  Continue aspirin and statin.  Hypertension: Continue home valsartan and amlodipine.  IV Lasix as above.  Seizure disorder: Continue Lamictal.  Bipolar/depression/anxiety: Continue home Abilify, sertraline.  Chronic pain: Continue home Percocet 10-325 mg q4h prn with hold parameters.   DVT prophylaxis: Lovenox Code Status: DNR, confirmed with patient Family Communication: Discussed with patient, she has discussed with her roommate/POA Disposition Plan: From home and likely discharge to home pending improvement in respiratory status Consults called: None Level of care: Stepdown Admission status:  Status is: Inpatient  Remains inpatient appropriate because:Inpatient level of care appropriate due to severity of illness   Dispo: The patient is from: Home              Anticipated d/c is to: Home              Patient currently is not medically stable to d/c.   Difficult to place patient No  Zada Finders MD [facility]  If 7PM-7AM, please contact night-coverage [email or web address]  05/28/2020, 10:25 PM

## 2020-05-29 ENCOUNTER — Inpatient Hospital Stay (HOSPITAL_COMMUNITY): Payer: Medicare Other

## 2020-05-29 DIAGNOSIS — I5033 Acute on chronic diastolic (congestive) heart failure: Secondary | ICD-10-CM

## 2020-05-29 DIAGNOSIS — I5043 Acute on chronic combined systolic (congestive) and diastolic (congestive) heart failure: Secondary | ICD-10-CM

## 2020-05-29 LAB — CBC
HCT: 44.5 % (ref 36.0–46.0)
Hemoglobin: 14.6 g/dL (ref 12.0–15.0)
MCH: 31.1 pg (ref 26.0–34.0)
MCHC: 32.8 g/dL (ref 30.0–36.0)
MCV: 94.9 fL (ref 80.0–100.0)
Platelets: 255 10*3/uL (ref 150–400)
RBC: 4.69 MIL/uL (ref 3.87–5.11)
RDW: 14.5 % (ref 11.5–15.5)
WBC: 14.1 10*3/uL — ABNORMAL HIGH (ref 4.0–10.5)
nRBC: 0 % (ref 0.0–0.2)

## 2020-05-29 LAB — BASIC METABOLIC PANEL
Anion gap: 10 (ref 5–15)
BUN: 18 mg/dL (ref 8–23)
CO2: 25 mmol/L (ref 22–32)
Calcium: 10.2 mg/dL (ref 8.9–10.3)
Chloride: 103 mmol/L (ref 98–111)
Creatinine, Ser: 0.91 mg/dL (ref 0.44–1.00)
GFR, Estimated: >60 mL/min (ref >60)
Glucose, Bld: 274 mg/dL — ABNORMAL HIGH (ref 70–99)
Potassium: 3.6 mmol/L (ref 3.5–5.1)
Sodium: 138 mmol/L (ref 135–145)

## 2020-05-29 LAB — SARS CORONAVIRUS 2 (TAT 6-24 HRS): SARS Coronavirus 2: NEGATIVE

## 2020-05-29 LAB — MRSA PCR SCREENING: MRSA by PCR: NEGATIVE

## 2020-05-29 LAB — ECHOCARDIOGRAM COMPLETE
Area-P 1/2: 2.99 cm2
Height: 63 in
S' Lateral: 3 cm
Weight: 2874.8 oz

## 2020-05-29 LAB — MAGNESIUM: Magnesium: 2.1 mg/dL (ref 1.7–2.4)

## 2020-05-29 MED ORDER — AMLODIPINE BESYLATE 10 MG PO TABS
10.0000 mg | ORAL_TABLET | Freq: Every day | ORAL | Status: DC
  Administered 2020-05-29 – 2020-05-31 (×3): 10 mg via ORAL
  Filled 2020-05-29 (×4): qty 1

## 2020-05-29 MED ORDER — FUROSEMIDE 10 MG/ML IJ SOLN
40.0000 mg | Freq: Every day | INTRAMUSCULAR | Status: DC
  Administered 2020-05-30 – 2020-05-31 (×2): 40 mg via INTRAVENOUS
  Filled 2020-05-29 (×2): qty 4

## 2020-05-29 MED ORDER — UMECLIDINIUM BROMIDE 62.5 MCG/INH IN AEPB
1.0000 | INHALATION_SPRAY | Freq: Every day | RESPIRATORY_TRACT | Status: DC
  Administered 2020-05-29 – 2020-05-31 (×3): 1 via RESPIRATORY_TRACT
  Filled 2020-05-29 (×2): qty 7

## 2020-05-29 MED ORDER — FLUTICASONE FUROATE-VILANTEROL 100-25 MCG/INH IN AEPB
1.0000 | INHALATION_SPRAY | Freq: Every day | RESPIRATORY_TRACT | Status: DC
  Administered 2020-05-29 – 2020-05-31 (×3): 1 via RESPIRATORY_TRACT
  Filled 2020-05-29 (×2): qty 28

## 2020-05-29 MED ORDER — HYDRALAZINE HCL 20 MG/ML IJ SOLN
5.0000 mg | Freq: Four times a day (QID) | INTRAMUSCULAR | Status: DC | PRN
  Administered 2020-05-30 – 2020-05-31 (×2): 5 mg via INTRAVENOUS
  Filled 2020-05-29 (×3): qty 1

## 2020-05-29 MED ORDER — ATORVASTATIN CALCIUM 10 MG PO TABS
20.0000 mg | ORAL_TABLET | Freq: Every day | ORAL | Status: DC
  Administered 2020-05-29 – 2020-05-31 (×3): 20 mg via ORAL
  Filled 2020-05-29 (×3): qty 2

## 2020-05-29 MED ORDER — IRBESARTAN 300 MG PO TABS
300.0000 mg | ORAL_TABLET | Freq: Every day | ORAL | Status: DC
  Administered 2020-05-29 – 2020-05-31 (×3): 300 mg via ORAL
  Filled 2020-05-29 (×3): qty 1

## 2020-05-29 NOTE — Progress Notes (Signed)
Progress Note    [name]   BOF:[id number]  DOB: 1945/08/24  DOA: 05/28/2020     1  PCP: Harlan Stains, MD  CC: SOB  Hospital Course: [name] is a 75 y.o. female with medical history significant for severe COPD/emphysema, chronic respiratory failure with hypoxia on home O2, chronic combined systolic and diastolic CHF (last EF improved to 60-65% 03/2019), CAD s/p inferior MI with PCI to RCA in 2000, carotid artery stenosis s/p Rt CEA 1995, PAD, renal cancer s/p partial left nephrectomy 2014, history of seizures, HTN, HLD, bipolar/depression/anxiety who presents to the ED for evaluation of dyspnea.  Patient had developed worsening dyspnea with minimal exertion at home.  Despite using home oxygen which is intermittent for her she did not have much relief.  She also had a mildly productive cough but swallowed the sputum so was unable to elaborate on color.  She takes Lasix as needed for lower extremity swelling but denied any significant lower extremity edema on admission.  She was found to have pulmonary edema on CXR on admission and was started on IV Lasix, Solu-Medrol, and breathing treatments.  Interval History:  Seen this morning in her room.  States that she is breathing more comfortably since admission.  Currently on 4 L oxygen.  She endorses to me that she only wears oxygen at home when needed.  ROS: Constitutional: negative for chills and fevers, Respiratory: negative for wheezing, Cardiovascular: negative for chest pain and Gastrointestinal: negative for abdominal pain  Assessment & Plan:  Acute on chronic respiratory failure with hypoxia  - suspected due to CHF exacerbation - on PRN O2 at home - currently 4L this am - wean O2 as able  Acute on chronic combined systolic and diastolic CHF - BNP 778 and CXR shows edema - EF had improved on March 2021 echo - follow up repeat echo - continue IV lasix to diurese - strict I&O  COPD with acute  exacerbation: Likely triggered by pulmonary edema.  Doubt bacterial pneumonia.  Leukocytosis likely due to IV steroids given with EMS. -Continue scheduled DuoNebs with as needed albuterol -IV Solu-Medrol 40 mg twice daily -Continue home Breztri and Singulair -Wean supplemental O2 as able  CAD s/p PCI to RCA CAS s/p R CEA PAD HLD -Chronic and stable.  Continue aspirin and statin.  Hypertension: Continue home valsartan and amlodipine.  IV Lasix as above.  Seizure disorder: Continue Lamictal.  Bipolar/depression/anxiety: Continue home Abilify, sertraline.  Chronic pain: -Verified on database.  Receives Percocet 10-325.  Last filled 05/04/2020   Old records reviewed in assessment of this patient  Antimicrobials:   DVT prophylaxis: enoxaparin (LOVENOX) injection 40 mg Start: 05/28/20 2300   Code Status:   Code Status: DNR Family Communication:   Disposition Plan: Status is: Inpatient  Remains inpatient appropriate because:IV treatments appropriate due to intensity of illness or inability to take PO and Inpatient level of care appropriate due to severity of illness   Dispo: The patient is from: Home              Anticipated d/c is to: Home              Patient currently is not medically stable to d/c.   Difficult to place patient No  Risk of unplanned readmission score: Unplanned Admission- Pilot do not use: 15.32   Objective: Blood pressure (!) 145/113, pulse 82, temperature 98.1 F (36.7 C), temperature source Oral, resp. rate 17, height $RemoveBe'5\' 3"'isLJHZrEh$  (1.6 m), weight 81.5  kg, SpO2 90 %.  Examination: General appearance: alert, cooperative and no distress Head: Normocephalic, without obvious abnormality, atraumatic Eyes: EOMI Lungs: Bilateral crackles.  No wheezing Heart: regular rate and rhythm and S1, S2 normal Abdomen: normal findings: bowel sounds normal and soft, non-tender Extremities: No edema in LE Skin: mobility and turgor normal Neurologic: Grossly  normal  Consultants:     Procedures:     Data Reviewed: I have personally reviewed following labs and imaging studies Results for orders placed or performed during the hospital encounter of 05/28/20 (from the past 24 hour(s))  Comprehensive metabolic panel     Status: Abnormal   Collection Time: 05/28/20  7:24 PM  Result Value Ref Range   Sodium 140 135 - 145 mmol/L   Potassium 3.8 3.5 - 5.1 mmol/L   Chloride 104 98 - 111 mmol/L   CO2 29 22 - 32 mmol/L   Glucose, Bld 118 (H) 70 - 99 mg/dL   BUN 19 8 - 23 mg/dL   Creatinine, Ser 0.87 0.44 - 1.00 mg/dL   Calcium 10.6 (H) 8.9 - 10.3 mg/dL   Total Protein 7.6 6.5 - 8.1 g/dL   Albumin 4.2 3.5 - 5.0 g/dL   AST 27 15 - 41 U/L   ALT 21 0 - 44 U/L   Alkaline Phosphatase 90 38 - 126 U/L   Total Bilirubin 0.5 0.3 - 1.2 mg/dL   GFR, Estimated >60 >60 mL/min   Anion gap 7 5 - 15  CBC WITH DIFFERENTIAL     Status: Abnormal   Collection Time: 05/28/20  7:24 PM  Result Value Ref Range   WBC 13.7 (H) 4.0 - 10.5 K/uL   RBC 4.92 3.87 - 5.11 MIL/uL   Hemoglobin 15.4 (H) 12.0 - 15.0 g/dL   HCT 47.8 (H) 36.0 - 46.0 %   MCV 97.2 80.0 - 100.0 fL   MCH 31.3 26.0 - 34.0 pg   MCHC 32.2 30.0 - 36.0 g/dL   RDW 14.5 11.5 - 15.5 %   Platelets 265 150 - 400 K/uL   nRBC 0.0 0.0 - 0.2 %   Neutrophils Relative % 84 %   Neutro Abs 11.5 (H) 1.7 - 7.7 K/uL   Lymphocytes Relative 8 %   Lymphs Abs 1.1 0.7 - 4.0 K/uL   Monocytes Relative 5 %   Monocytes Absolute 0.7 0.1 - 1.0 K/uL   Eosinophils Relative 2 %   Eosinophils Absolute 0.2 0.0 - 0.5 K/uL   Basophils Relative 0 %   Basophils Absolute 0.1 0.0 - 0.1 K/uL   Immature Granulocytes 1 %   Abs Immature Granulocytes 0.11 (H) 0.00 - 0.07 K/uL  Brain natriuretic peptide     Status: Abnormal   Collection Time: 05/28/20  7:24 PM  Result Value Ref Range   B Natriuretic Peptide 222.4 (H) 0.0 - 100.0 pg/mL  Urinalysis, Routine w reflex microscopic Urine, Clean Catch     Status: Abnormal   Collection  Time: 05/28/20  7:24 PM  Result Value Ref Range   Color, Urine STRAW (A) YELLOW   APPearance CLEAR CLEAR   Specific Gravity, Urine 1.006 1.005 - 1.030   pH 6.0 5.0 - 8.0   Glucose, UA NEGATIVE NEGATIVE mg/dL   Hgb urine dipstick NEGATIVE NEGATIVE   Bilirubin Urine NEGATIVE NEGATIVE   Ketones, ur NEGATIVE NEGATIVE mg/dL   Protein, ur NEGATIVE NEGATIVE mg/dL   Nitrite NEGATIVE NEGATIVE   Leukocytes,Ua NEGATIVE NEGATIVE  POC SARS Coronavirus 2 Ag-ED - Nasal Swab (BD  Veritor Kit)     Status: None   Collection Time: 05/28/20  8:42 PM  Result Value Ref Range   SARSCOV2ONAVIRUS 2 AG NEGATIVE NEGATIVE  SARS CORONAVIRUS 2 (TAT 6-24 HRS) Nasopharyngeal Nasopharyngeal Swab     Status: None   Collection Time: 05/28/20  9:57 PM   Specimen: Nasopharyngeal Swab  Result Value Ref Range   SARS Coronavirus 2 NEGATIVE NEGATIVE  MRSA PCR Screening     Status: None   Collection Time: 05/28/20 11:44 PM   Specimen: Nasopharyngeal  Result Value Ref Range   MRSA by PCR NEGATIVE NEGATIVE  Magnesium     Status: None   Collection Time: 05/29/20  3:05 AM  Result Value Ref Range   Magnesium 2.1 1.7 - 2.4 mg/dL  Basic metabolic panel     Status: Abnormal   Collection Time: 05/29/20  3:05 AM  Result Value Ref Range   Sodium 138 135 - 145 mmol/L   Potassium 3.6 3.5 - 5.1 mmol/L   Chloride 103 98 - 111 mmol/L   CO2 25 22 - 32 mmol/L   Glucose, Bld 274 (H) 70 - 99 mg/dL   BUN 18 8 - 23 mg/dL   Creatinine, Ser 0.91 0.44 - 1.00 mg/dL   Calcium 10.2 8.9 - 10.3 mg/dL   GFR, Estimated >60 >60 mL/min   Anion gap 10 5 - 15  CBC     Status: Abnormal   Collection Time: 05/29/20  3:05 AM  Result Value Ref Range   WBC 14.1 (H) 4.0 - 10.5 K/uL   RBC 4.69 3.87 - 5.11 MIL/uL   Hemoglobin 14.6 12.0 - 15.0 g/dL   HCT 44.5 36.0 - 46.0 %   MCV 94.9 80.0 - 100.0 fL   MCH 31.1 26.0 - 34.0 pg   MCHC 32.8 30.0 - 36.0 g/dL   RDW 14.5 11.5 - 15.5 %   Platelets 255 150 - 400 K/uL   nRBC 0.0 0.0 - 0.2 %    Recent  Results (from the past 240 hour(s))  SARS CORONAVIRUS 2 (TAT 6-24 HRS) Nasopharyngeal Nasopharyngeal Swab     Status: None   Collection Time: 05/28/20  9:57 PM   Specimen: Nasopharyngeal Swab  Result Value Ref Range Status   SARS Coronavirus 2 NEGATIVE NEGATIVE Final    Comment: (NOTE) SARS-CoV-2 target nucleic acids are NOT DETECTED.  The SARS-CoV-2 RNA is generally detectable in upper and lower respiratory specimens during the acute phase of infection. Negative results do not preclude SARS-CoV-2 infection, do not rule out co-infections with other pathogens, and should not be used as the sole basis for treatment or other patient management decisions. Negative results must be combined with clinical observations, patient history, and epidemiological information. The expected result is Negative.  Fact Sheet for Patients: SugarRoll.be  Fact Sheet for Healthcare Providers: https://www.woods-mathews.com/  This test is not yet approved or cleared by the [place] FDA and  has been authorized for detection and/or diagnosis of SARS-CoV-2 by FDA under an Emergency Use Authorization (EUA). This EUA will remain  in effect (meaning this test can be used) for the duration of the COVID-19 declaration under Se ction 564(b)(1) of the Act, 21 U.S.C. section 360bbb-3(b)(1), unless the authorization is terminated or revoked sooner.  Performed at [facility], [place] [address] [place], [place] [address]   MRSA PCR Screening     Status: None   Collection Time: 05/28/20 11:44 PM   Specimen: Nasopharyngeal  Result Value Ref Range Status  MRSA by PCR NEGATIVE NEGATIVE Final    Comment:        The GeneXpert MRSA Assay (FDA approved for NASAL specimens only), is one component of a comprehensive MRSA colonization surveillance program. It is not intended to diagnose MRSA infection nor to guide or monitor treatment for MRSA infections. Performed  at [facility], [place] [address] [place], [place] [address]      Radiology Studies: [facility] Chest Port 1 View  Result Date: 05/28/2020 CLINICAL DATA:  Shortness of breath. EXAM: PORTABLE CHEST 1 VIEW COMPARISON:  September 02, 2019 FINDINGS: Cardiomegaly. Bilateral pulmonary opacities are identified, relatively symmetric. The hila and mediastinum are unremarkable. No pneumothorax. No other abnormalities. IMPRESSION: Findings are most consistent with cardiomegaly and pulmonary edema. Bilateral pneumonia is not completely excluded. Recommend clinical correlation. Electronically Signed   By: Dorise Bullion III M.D   On: 05/28/2020 19:26   DG Chest Port 1 View  Final Result      Scheduled Meds: . amLODipine  10 mg Oral Daily   And  . atorvastatin  20 mg Oral Daily  . ARIPiprazole  10 mg Oral Daily  . aspirin EC  81 mg Oral q morning  . Chlorhexidine Gluconate Cloth  6 each Topical Daily  . enoxaparin (LOVENOX) injection  40 mg Subcutaneous Q24H  . fluticasone furoate-vilanterol  1 puff Inhalation Daily   And  . umeclidinium bromide  1 puff Inhalation Daily  . [START ON 05/30/2020] furosemide  40 mg Intravenous Daily  . ipratropium-albuterol  3 mL Nebulization TID  . irbesartan  300 mg Oral Daily  . lamoTRIgine  200 mg Oral BID  . mouth rinse  15 mL Mouth Rinse BID  . methylPREDNISolone (SOLU-MEDROL) injection  40 mg Intravenous Q12H  . montelukast  10 mg Oral QHS  . pantoprazole  40 mg Oral Daily  . senna-docusate  2 tablet Oral BID  . sertraline  50 mg Oral Daily  . sodium chloride flush  3 mL Intravenous Q12H   PRN Meds: acetaminophen **OR** acetaminophen, albuterol, hydrALAZINE, ondansetron **OR** ondansetron (ZOFRAN) IV, oxyCODONE-acetaminophen **AND** oxyCODONE, polyethylene glycol, senna-docusate Continuous Infusions: . albuterol Stopped (05/28/20 2221)     LOS: 1 day  Time spent: Greater than 50% of the 35 minute visit was spent in counseling/coordination of  care for the patient as laid out in the A&P.   Dwyane Dee, MD [facility] 05/29/2020, 2:44 PM

## 2020-05-29 NOTE — Progress Notes (Signed)
  Echocardiogram 2D Echocardiogram has been performed.  [name] 05/29/2020, 2:43 PM

## 2020-05-30 LAB — CBC WITH DIFFERENTIAL/PLATELET
Abs Immature Granulocytes: 0.04 10*3/uL (ref 0.00–0.07)
Basophils Absolute: 0 10*3/uL (ref 0.0–0.1)
Basophils Relative: 0 %
Eosinophils Absolute: 0 10*3/uL (ref 0.0–0.5)
Eosinophils Relative: 0 %
HCT: 42.8 % (ref 36.0–46.0)
Hemoglobin: 14.5 g/dL (ref 12.0–15.0)
Immature Granulocytes: 0 %
Lymphocytes Relative: 4 %
Lymphs Abs: 0.5 10*3/uL — ABNORMAL LOW (ref 0.7–4.0)
MCH: 32.4 pg (ref 26.0–34.0)
MCHC: 33.9 g/dL (ref 30.0–36.0)
MCV: 95.7 fL (ref 80.0–100.0)
Monocytes Absolute: 0.3 10*3/uL (ref 0.1–1.0)
Monocytes Relative: 3 %
Neutro Abs: 11.3 10*3/uL — ABNORMAL HIGH (ref 1.7–7.7)
Neutrophils Relative %: 93 %
Platelets: 254 10*3/uL (ref 150–400)
RBC: 4.47 MIL/uL (ref 3.87–5.11)
RDW: 14.1 % (ref 11.5–15.5)
WBC: 12.2 10*3/uL — ABNORMAL HIGH (ref 4.0–10.5)
nRBC: 0 % (ref 0.0–0.2)

## 2020-05-30 LAB — BASIC METABOLIC PANEL
Anion gap: 9 (ref 5–15)
BUN: 17 mg/dL (ref 8–23)
CO2: 26 mmol/L (ref 22–32)
Calcium: 10.1 mg/dL (ref 8.9–10.3)
Chloride: 102 mmol/L (ref 98–111)
Creatinine, Ser: 0.81 mg/dL (ref 0.44–1.00)
GFR, Estimated: >60 mL/min (ref >60)
Glucose, Bld: 156 mg/dL — ABNORMAL HIGH (ref 70–99)
Potassium: 3.3 mmol/L — ABNORMAL LOW (ref 3.5–5.1)
Sodium: 137 mmol/L (ref 135–145)

## 2020-05-30 LAB — MAGNESIUM: Magnesium: 2.2 mg/dL (ref 1.7–2.4)

## 2020-05-30 MED ORDER — POTASSIUM CHLORIDE CRYS ER 20 MEQ PO TBCR
40.0000 meq | EXTENDED_RELEASE_TABLET | Freq: Once | ORAL | Status: AC
  Administered 2020-05-30: 40 meq via ORAL
  Filled 2020-05-30: qty 2

## 2020-05-30 MED ORDER — PREDNISONE 20 MG PO TABS
40.0000 mg | ORAL_TABLET | Freq: Every day | ORAL | Status: DC
  Administered 2020-05-31: 40 mg via ORAL
  Filled 2020-05-30: qty 2

## 2020-05-30 NOTE — Plan of Care (Signed)

## 2020-05-30 NOTE — Progress Notes (Signed)
Progress Note    [name]   ZOX:[id number]  DOB: [birth year]  DOA: 05/28/2020     2  PCP: Harlan Stains, MD  CC: SOB  Hospital Course: [name] is a 75 y.o. female with medical history significant for severe COPD/emphysema, chronic respiratory failure with hypoxia on home O2, chronic combined systolic and diastolic CHF (last EF improved to 60-65% 03/2019), CAD s/p inferior MI with PCI to RCA in 2000, carotid artery stenosis s/p Rt CEA 1995, PAD, renal cancer s/p partial left nephrectomy 2014, history of seizures, HTN, HLD, bipolar/depression/anxiety who presents to the ED for evaluation of dyspnea.  Patient had developed worsening dyspnea with minimal exertion at home.  Despite using home oxygen which is intermittent for her she did not have much relief.  She also had a mildly productive cough but swallowed the sputum so was unable to elaborate on color.  She takes Lasix as needed for lower extremity swelling but denied any significant lower extremity edema on admission.  She was found to have pulmonary edema on CXR on admission and was started on IV Lasix, Solu-Medrol, and breathing treatments.  Interval History:  No major events overnight.  Feeling much better this morning in general.  States that her breathing is easier and she has been voiding well since being started on IV Lasix.  Oxygen has been weaned down some, 2 L this morning.  ROS: Constitutional: negative for chills and fevers, Respiratory: negative for wheezing, Cardiovascular: negative for chest pain and Gastrointestinal: negative for abdominal pain  Assessment & Plan:  Acute on chronic respiratory failure with hypoxia  - suspected due to CHF exacerbation - on PRN O2 at home - currently 2L this am (down from 4L) - wean O2 as able  Acute on chronic combined systolic and diastolic CHF - BNP 811 and CXR shows edema - EF had improved on March 2021 echo - echo reviewed; consistent with prior known heart  function; previous [facility] notes reviewed too - EF 55%, Gr 1 DD, known basal inferior hypokinesis - continue IV lasix to diurese - strict I&O  COPD with acute exacerbation: Likely triggered by pulmonary edema.  Doubt bacterial pneumonia.  Leukocytosis likely due to IV steroids given with EMS. -Continue scheduled DuoNebs with as needed albuterol - change steroids to PO -Continue home Breztri and Singulair -Wean supplemental O2 as able  CAD s/p PCI to RCA CAS s/p R CEA PAD HLD -Chronic and stable.  Continue aspirin and statin.  Hypertension: Continue home valsartan and amlodipine.  IV Lasix as above.  Seizure disorder: Continue Lamictal.  Bipolar/depression/anxiety: Continue home Abilify, sertraline.  Chronic pain: -Verified on database.  Receives Percocet 10-325.  Last filled 05/04/2020   Old records reviewed in assessment of this patient  Antimicrobials:   DVT prophylaxis: enoxaparin (LOVENOX) injection 40 mg Start: 05/28/20 2300   Code Status:   Code Status: DNR Family Communication:   Disposition Plan: Status is: Inpatient  Remains inpatient appropriate because:IV treatments appropriate due to intensity of illness or inability to take PO and Inpatient level of care appropriate due to severity of illness   Dispo: The patient is from: Home              Anticipated d/c is to: Home              Patient currently is not medically stable to d/c.   Difficult to place patient No  Risk of unplanned readmission score: Unplanned Admission- Pilot do not use: 15.54  Objective: Blood pressure (!) 159/67, pulse 86, temperature (!) 97.5 F (36.4 C), temperature source Oral, resp. rate 18, height 5\' 3"  (1.6 m), weight 81.5 kg, SpO2 93 %.  Examination: General appearance: alert, cooperative and no distress Head: Normocephalic, without obvious abnormality, atraumatic Eyes: EOMI Lungs: Bilateral crackles.  No wheezing Heart: regular rate and rhythm and S1, S2  normal Abdomen: normal findings: bowel sounds normal and soft, non-tender Extremities: No edema in LE Skin: mobility and turgor normal Neurologic: Grossly normal  Consultants:     Procedures:     Data Reviewed: I have personally reviewed following labs and imaging studies Results for orders placed or performed during the hospital encounter of 05/28/20 (from the past 24 hour(s))  Basic metabolic panel     Status: Abnormal   Collection Time: 05/30/20  2:18 AM  Result Value Ref Range   Sodium 137 135 - 145 mmol/L   Potassium 3.3 (L) 3.5 - 5.1 mmol/L   Chloride 102 98 - 111 mmol/L   CO2 26 22 - 32 mmol/L   Glucose, Bld 156 (H) 70 - 99 mg/dL   BUN 17 8 - 23 mg/dL   Creatinine, Ser 0.81 0.44 - 1.00 mg/dL   Calcium 10.1 8.9 - 10.3 mg/dL   GFR, Estimated >60 >60 mL/min   Anion gap 9 5 - 15  CBC with Differential/Platelet     Status: Abnormal   Collection Time: 05/30/20  2:18 AM  Result Value Ref Range   WBC 12.2 (H) 4.0 - 10.5 K/uL   RBC 4.47 3.87 - 5.11 MIL/uL   Hemoglobin 14.5 12.0 - 15.0 g/dL   HCT 42.8 36.0 - 46.0 %   MCV 95.7 80.0 - 100.0 fL   MCH 32.4 26.0 - 34.0 pg   MCHC 33.9 30.0 - 36.0 g/dL   RDW 14.1 11.5 - 15.5 %   Platelets 254 150 - 400 K/uL   nRBC 0.0 0.0 - 0.2 %   Neutrophils Relative % 93 %   Neutro Abs 11.3 (H) 1.7 - 7.7 K/uL   Lymphocytes Relative 4 %   Lymphs Abs 0.5 (L) 0.7 - 4.0 K/uL   Monocytes Relative 3 %   Monocytes Absolute 0.3 0.1 - 1.0 K/uL   Eosinophils Relative 0 %   Eosinophils Absolute 0.0 0.0 - 0.5 K/uL   Basophils Relative 0 %   Basophils Absolute 0.0 0.0 - 0.1 K/uL   Immature Granulocytes 0 %   Abs Immature Granulocytes 0.04 0.00 - 0.07 K/uL  Magnesium     Status: None   Collection Time: 05/30/20  2:18 AM  Result Value Ref Range   Magnesium 2.2 1.7 - 2.4 mg/dL    Recent Results (from the past 240 hour(s))  SARS CORONAVIRUS 2 (TAT 6-24 HRS) Nasopharyngeal Nasopharyngeal Swab     Status: None   Collection Time: 05/28/20  9:57 PM    Specimen: Nasopharyngeal Swab  Result Value Ref Range Status   SARS Coronavirus 2 NEGATIVE NEGATIVE Final    Comment: (NOTE) SARS-CoV-2 target nucleic acids are NOT DETECTED.  The SARS-CoV-2 RNA is generally detectable in upper and lower respiratory specimens during the acute phase of infection. Negative results do not preclude SARS-CoV-2 infection, do not rule out co-infections with other pathogens, and should not be used as the sole basis for treatment or other patient management decisions. Negative results must be combined with clinical observations, patient history, and epidemiological information. The expected result is Negative.  Fact Sheet for Patients: SugarRoll.be  Fact Sheet  for Healthcare Providers: https://www.woods-mathews.com/  This test is not yet approved or cleared by the [place] and  has been authorized for detection and/or diagnosis of SARS-CoV-2 by FDA under an Emergency Use Authorization (EUA). This EUA will remain  in effect (meaning this test can be used) for the duration of the COVID-19 declaration under Se ction 564(b)(1) of the Act, 21 U.S.C. section 360bbb-3(b)(1), unless the authorization is terminated or revoked sooner.  Performed at [facility], [place] [address], [place], [place] [address]   MRSA PCR Screening     Status: None   Collection Time: 05/28/20 11:44 PM   Specimen: Nasopharyngeal  Result Value Ref Range Status   MRSA by PCR NEGATIVE NEGATIVE Final    Comment:        The GeneXpert MRSA Assay (FDA approved for NASAL specimens only), is one component of a comprehensive MRSA colonization surveillance program. It is not intended to diagnose MRSA infection nor to guide or monitor treatment for MRSA infections. Performed at [facility], [place] [address], [place],  [address]      Radiology Studies: [facility] Chest Port 1 View  Result Date:  05/28/2020 CLINICAL DATA:  Shortness of breath. EXAM: PORTABLE CHEST 1 VIEW COMPARISON:  September 02, 2019 FINDINGS: Cardiomegaly. Bilateral pulmonary opacities are identified, relatively symmetric. The hila and mediastinum are unremarkable. No pneumothorax. No other abnormalities. IMPRESSION: Findings are most consistent with cardiomegaly and pulmonary edema. Bilateral pneumonia is not completely excluded. Recommend clinical correlation. Electronically Signed   By: Dorise Bullion III M.D   On: 05/28/2020 19:26   ECHOCARDIOGRAM COMPLETE  Result Date: 05/29/2020    ECHOCARDIOGRAM REPORT   Patient Name:   LENNAN WESELY [facility] Date of Exam: 05/29/2020 Medical Rec #:  [id number]        Height:       63.0 in Accession #:    [id number]       Weight:       179.7 lb Date of Birth:  [birth year]        BSA:          1.848 m Patient Age:    57 years         BP:           145/113 mmHg Patient Gender: F                HR:           95 bpm. Exam Location:  Inpatient Procedure: 2D Echo, Cardiac Doppler and Color Doppler Indications:    I50.33 Acute on chronic diastolic (congestive) heart failure  History:        Patient has prior history of Echocardiogram examinations, most                 recent 03/20/2019. Previous Myocardial Infarction and CAD, COPD;                 Risk Factors:Hypertension. GERD. Alcoholism.  Sonographer:    Jonelle Sidle Dance Referring Phys: [id number] [place]  1. Left ventricular ejection fraction, by estimation, is 55%. The left ventricle has normal function. The left ventricle demonstrates regional wall motion abnormalities with basal inferior severe hypokinesis. There is mild left ventricular hypertrophy. Left ventricular diastolic parameters are consistent with Grade I diastolic dysfunction (impaired relaxation).  2. Right ventricular systolic function is normal. The right ventricular size is normal. There is normal pulmonary artery systolic pressure. The estimated right ventricular systolic  pressure  is 32.4 mmHg.  3. Left atrial size was mildly dilated.  4. Right atrial size was mildly dilated.  5. The mitral valve is degenerative. Mild mitral valve regurgitation. No evidence of mitral stenosis. Moderate mitral annular calcification.  6. The aortic valve is tricuspid. Aortic valve regurgitation is not visualized. Mild aortic valve sclerosis is present, with no evidence of aortic valve stenosis.  7. The inferior vena cava is normal in size with greater than 50% respiratory variability, suggesting right atrial pressure of 3 mmHg. FINDINGS  Left Ventricle: Left ventricular ejection fraction, by estimation, is 55%. The left ventricle has normal function. The left ventricle demonstrates regional wall motion abnormalities. The left ventricular internal cavity size was normal in size. There is  mild left ventricular hypertrophy. Left ventricular diastolic parameters are consistent with Grade I diastolic dysfunction (impaired relaxation). Right Ventricle: The right ventricular size is normal. No increase in right ventricular wall thickness. Right ventricular systolic function is normal. There is normal pulmonary artery systolic pressure. The tricuspid regurgitant velocity is 2.71 m/s, and  with an assumed right atrial pressure of 3 mmHg, the estimated right ventricular systolic pressure is [id number] mmHg. Left Atrium: Left atrial size was mildly dilated. Right Atrium: Right atrial size was mildly dilated. Pericardium: There is no evidence of pericardial effusion. Mitral Valve: The mitral valve is degenerative in appearance. There is mild calcification of the mitral valve leaflet(s). Moderate mitral annular calcification. Mild mitral valve regurgitation. No evidence of mitral valve stenosis. Tricuspid Valve: The tricuspid valve is normal in structure. Tricuspid valve regurgitation is trivial. Aortic Valve: The aortic valve is tricuspid. Aortic valve regurgitation is not visualized. Mild aortic valve sclerosis is  present, with no evidence of aortic valve stenosis. Pulmonic Valve: The pulmonic valve was normal in structure. Pulmonic valve regurgitation is not visualized. Aorta: The aortic root is normal in size and structure. Venous: The inferior vena cava is normal in size with greater than 50% respiratory variability, suggesting right atrial pressure of 3 mmHg. IAS/Shunts: No atrial level shunt detected by color flow Doppler.  LEFT VENTRICLE PLAX 2D LVIDd:         5.20 cm LVIDs:         3.00 cm LV PW:         1.30 cm LV IVS:        0.90 cm LVOT diam:     2.20 cm LV SV:         102 LV SV Index:   55 LVOT Area:     3.80 cm  RIGHT VENTRICLE             IVC RV Basal diam:  3.20 cm     IVC diam: 1.80 cm RV Mid diam:    2.10 cm RV S prime:     13.60 cm/s TAPSE (M-mode): 2.3 cm LEFT ATRIUM             Index       RIGHT ATRIUM           Index LA diam:        4.00 cm 2.17 cm/m  RA Area:     20.40 cm LA Vol (A2C):   51.1 ml 27.66 ml/m RA Volume:   58.50 ml  31.66 ml/m LA Vol (A4C):   72.4 ml 39.19 ml/m LA Biplane Vol: 66.8 ml 36.16 ml/m  AORTIC VALVE LVOT Vmax:   130.00 cm/s LVOT Vmean:  83.100 cm/s LVOT VTI:    0.268 m  AORTA  Ao Root diam: 3.10 cm Ao Asc diam:  3.70 cm MITRAL VALVE                TRICUSPID VALVE MV Area (PHT): 2.99 cm     TR Peak grad:   29.4 mmHg MV Decel Time: 254 msec     TR Vmax:        271.00 cm/s MV E velocity: 75.30 cm/s MV A velocity: 111.00 cm/s  SHUNTS MV E/A ratio:  0.68         Systemic VTI:  0.27 m                             Systemic Diam: 2.20 cm Loralie Champagne MD Electronically signed by Loralie Champagne MD Signature Date/Time: 05/29/2020/3:49:07 PM    Final    DG Chest Port 1 View  Final Result      Scheduled Meds: . amLODipine  10 mg Oral Daily   And  . atorvastatin  20 mg Oral Daily  . ARIPiprazole  10 mg Oral Daily  . aspirin EC  81 mg Oral q morning  . Chlorhexidine Gluconate Cloth  6 each Topical Daily  . enoxaparin (LOVENOX) injection  40 mg Subcutaneous Q24H  . fluticasone  furoate-vilanterol  1 puff Inhalation Daily   And  . umeclidinium bromide  1 puff Inhalation Daily  . furosemide  40 mg Intravenous Daily  . ipratropium-albuterol  3 mL Nebulization TID  . irbesartan  300 mg Oral Daily  . lamoTRIgine  200 mg Oral BID  . mouth rinse  15 mL Mouth Rinse BID  . methylPREDNISolone (SOLU-MEDROL) injection  40 mg Intravenous Q12H  . montelukast  10 mg Oral QHS  . pantoprazole  40 mg Oral Daily  . senna-docusate  2 tablet Oral BID  . sertraline  50 mg Oral Daily  . sodium chloride flush  3 mL Intravenous Q12H   PRN Meds: acetaminophen **OR** acetaminophen, albuterol, hydrALAZINE, ondansetron **OR** ondansetron (ZOFRAN) IV, oxyCODONE-acetaminophen **AND** oxyCODONE, polyethylene glycol, senna-docusate Continuous Infusions: . albuterol Stopped (05/28/20 2221)     LOS: 2 days  Time spent: Greater than 50% of the 35 minute visit was spent in counseling/coordination of care for the patient as laid out in the A&P.   Dwyane Dee, MD [facility] 05/30/2020, 1:18 PM

## 2020-05-31 LAB — BASIC METABOLIC PANEL
Anion gap: 8 (ref 5–15)
BUN: 16 mg/dL (ref 8–23)
CO2: 28 mmol/L (ref 22–32)
Calcium: 10 mg/dL (ref 8.9–10.3)
Chloride: 103 mmol/L (ref 98–111)
Creatinine, Ser: 0.72 mg/dL (ref 0.44–1.00)
GFR, Estimated: >60 mL/min (ref >60)
Glucose, Bld: 116 mg/dL — ABNORMAL HIGH (ref 70–99)
Potassium: 3.3 mmol/L — ABNORMAL LOW (ref 3.5–5.1)
Sodium: 139 mmol/L (ref 135–145)

## 2020-05-31 LAB — CBC WITH DIFFERENTIAL/PLATELET
Abs Immature Granulocytes: 0.03 10*3/uL (ref 0.00–0.07)
Basophils Absolute: 0 10*3/uL (ref 0.0–0.1)
Basophils Relative: 0 %
Eosinophils Absolute: 0.1 10*3/uL (ref 0.0–0.5)
Eosinophils Relative: 1 %
HCT: 46.9 % — ABNORMAL HIGH (ref 36.0–46.0)
Hemoglobin: 16 g/dL — ABNORMAL HIGH (ref 12.0–15.0)
Immature Granulocytes: 0 %
Lymphocytes Relative: 15 %
Lymphs Abs: 1.8 10*3/uL (ref 0.7–4.0)
MCH: 32.7 pg (ref 26.0–34.0)
MCHC: 34.1 g/dL (ref 30.0–36.0)
MCV: 95.9 fL (ref 80.0–100.0)
Monocytes Absolute: 0.9 10*3/uL (ref 0.1–1.0)
Monocytes Relative: 8 %
Neutro Abs: 8.9 10*3/uL — ABNORMAL HIGH (ref 1.7–7.7)
Neutrophils Relative %: 76 %
Platelets: 269 10*3/uL (ref 150–400)
RBC: 4.89 MIL/uL (ref 3.87–5.11)
RDW: 14.2 % (ref 11.5–15.5)
WBC: 11.7 10*3/uL — ABNORMAL HIGH (ref 4.0–10.5)
nRBC: 0 % (ref 0.0–0.2)

## 2020-05-31 LAB — MAGNESIUM: Magnesium: 2.2 mg/dL (ref 1.7–2.4)

## 2020-05-31 MED ORDER — LABETALOL HCL 5 MG/ML IV SOLN: 10.0000 mg | INTRAVENOUS | Status: DC | PRN

## 2020-05-31 MED ORDER — HYDRALAZINE HCL 25 MG PO TABS
25.0000 mg | ORAL_TABLET | Freq: Two times a day (BID) | ORAL | Status: DC
  Administered 2020-05-31: 25 mg via ORAL
  Filled 2020-05-31: qty 1

## 2020-05-31 MED ORDER — HYDRALAZINE HCL 25 MG PO TABS: 25.0000 mg | ORAL_TABLET | ORAL | Status: DC | PRN

## 2020-05-31 MED ORDER — FUROSEMIDE 20 MG PO TABS: 20.0000 mg | ORAL_TABLET | Freq: Every day | ORAL | 3 refills | Status: DC

## 2020-05-31 MED ORDER — POTASSIUM CHLORIDE CRYS ER 20 MEQ PO TBCR
40.0000 meq | EXTENDED_RELEASE_TABLET | Freq: Once | ORAL | Status: AC
  Administered 2020-05-31: 40 meq via ORAL
  Filled 2020-05-31: qty 2

## 2020-05-31 NOTE — Plan of Care (Signed)
  Problem: Education: Goal: Knowledge of General Education information will improve Description: Including pain rating scale, medication(s)/side effects and non-pharmacologic comfort measures Outcome: Progressing   Problem: Health Behavior/Discharge Planning: Goal: Ability to manage health-related needs will improve Outcome: Progressing   Problem: Clinical Measurements: Goal: Respiratory complications will improve Outcome: Progressing   Problem: Elimination: Goal: Will not experience complications related to urinary retention Outcome: Progressing   

## 2020-05-31 NOTE — Discharge Summary (Signed)
Physician Discharge Summary   [name] [id number] DOB: 03-Oct-1945 DOA: 05/28/2020  PCP: Harlan Stains, MD  Admit date: 05/28/2020 Discharge date: 05/31/2020   Admitted From: home Disposition:  home Discharging physician: Dwyane Dee, MD  Recommendations for Outpatient Follow-up:  1. Check BMP; patient discharged on scheduled lasix instead of PRN as before   Patient discharged to home in Discharge Condition: stable Risk of unplanned readmission score: Unplanned Admission- Pilot do not use: 16.07  CODE STATUS: DNR Diet recommendation:  Diet Orders (From admission, onward)    Start     Ordered   05/31/20 0000  Diet - low sodium heart healthy        05/31/20 0913   05/28/20 2229  Diet Heart Room service appropriate? Yes; Fluid consistency: Thin  Diet effective now       Question Answer Comment  Room service appropriate? Yes   Fluid consistency: Thin      05/28/20 2229          Hospital Course:  [name] a 75 y.o.femalewith medical history significant forsevere COPD/emphysema, chronic respiratory failure with hypoxia on home O2, chronic combined systolic and diastolic CHF (last EF improved to 60-65%03/2019), CAD s/p inferior MI with PCI to RCA in 2000, carotid artery stenosis s/pRtCEA 1995, PAD, renal cancer s/p partial left nephrectomy 2014, history of seizures, HTN, HLD, bipolar/depression/anxiety who presented to the ED for evaluation of dyspnea.  Patient had developed worsening dyspnea with minimal exertion at home.  Despite using home oxygen which is intermittent for her she did not have much relief.  She also had a mildly productive cough but swallowed the sputum so was unable to elaborate on color.  She takes Lasix as needed for lower extremity swelling but denied any significant lower extremity edema on admission.  She was found to have pulmonary edema on CXR on admission and was started on IV Lasix, Solu-Medrol, and breathing  treatments.  Her work-up was considered more consistent with CHF exacerbation.  She was started on IV Lasix and diuresed exceptionally well.  At time of discharge it was decided to continue her on low-dose scheduled Lasix.  She will need outpatient repeat BMP to reevaluate renal function for tolerance and also potassium level check.  She is on low-dose potassium supplementation at home also.  This was also continued at discharge.  Acute on chronic respiratory failure with hypoxia -resolved - suspected due to CHF exacerbation - on PRN O2 at home -Weaned back to room air prior to discharge  Acute on chronic combined systolic and diastolic CHF - BNP [id number] and CXR shows edema - EF had improved on March 2021 echo - echo reviewed; consistent with prior known heart function; previous [facility] notes reviewed too - EF 55%, Gr 1 DD, known basal inferior hypokinesis -Diuresed well with IV Lasix - Continue Lasix p.o. 20 mg daily at discharge.  Patient has upcoming appointment with cardiology on 06/19/2020  COPDwith acuteexacerbation: Likely triggered by pulmonary edema. Doubt bacterial pneumonia. Leukocytosis likely due to IV steroids given with EMS. -Continue home Breztri and Singulair -Wean supplemental O2 as able -Treated with steroids inpatient but not felt to need taper at discharge  CAD s/p PCI to RCA CAS s/pR CEA PAD HLD -Chronic and stable. Continue aspirin and statin.  Hypertension: Continue home valsartan and amlodipine. Lasix as above.  Seizure disorder: Continue Lamictal.  Bipolar/depression/anxiety: Continue home Abilify, sertraline.  Chronic pain: -Verified on database.  Receives Percocet 10-325.  Last filled 05/04/2020  The  patient's chronic medical conditions were treated accordingly per the patient's home medication regimen except as noted.  On day of discharge, patient was felt deemed stable for discharge. Patient/family member advised to call PCP or come back to  ER if needed.   Principal Diagnosis: Acute on chronic combined systolic and diastolic CHF (congestive heart failure) ([employer])  Discharge Diagnoses: Active Hospital Problems   Diagnosis Date Noted  . Acute on chronic combined systolic and diastolic CHF (congestive heart failure) ([place]) 05/28/2020    Priority: High  . Acute on chronic respiratory failure with hypoxia ([facility]) 05/28/2020    Priority: High  . COPD with acute exacerbation ([place]) 05/28/2020  . Hypertension   . Anxiety and depression   . Carotid artery disease ([place]) 06/28/2011  . Coronary artery disease     Resolved Hospital Problems  No resolved problems to display.    Discharge Instructions    Diet - low sodium heart healthy   Complete by: As directed    Increase activity slowly   Complete by: As directed      Allergies as of 05/31/2020      Reactions   Doxycycline Diarrhea   Gabapentin Other (See Comments)   sleepiness      Medication List    TAKE these medications   albuterol 108 (90 Base) MCG/ACT inhaler Commonly known as: VENTOLIN HFA Inhale 2 puffs into the lungs every 6 (six) hours as needed for wheezing.   ARIPiprazole 20 MG tablet Commonly known as: ABILIFY Take 10 mg by mouth daily.   aspirin EC 81 MG tablet Take 81 mg by mouth every morning.   Breztri Aerosphere 160-9-4.8 MCG/ACT Aero Generic drug: Budeson-Glycopyrrol-Formoterol Inhale 2 puffs into the lungs in the morning and at bedtime.   Caduet 10-20 MG tablet Generic drug: amlodipine-atorvastatin TAKE 1 TABLET BY MOUTH DAILY.   Calcium Carb-Cholecalciferol 600-400 MG-UNIT Tabs Take 1 tablet by mouth daily.   celecoxib 200 MG capsule Commonly known as: CELEBREX Take 200 mg by mouth daily as needed for mild pain.   dexlansoprazole 60 MG capsule Commonly known as: DEXILANT Take 60 mg by mouth daily.   docusate sodium 100 MG capsule Commonly known as: COLACE Take 100 mg by mouth daily as needed for mild constipation.   ferrous  sulfate 325 (65 FE) MG tablet Take 325 mg by mouth daily.   furosemide 20 MG tablet Commonly known as: LASIX Take 1 tablet (20 mg total) by mouth daily. What changed:   when to take this  reasons to take this   hydrALAZINE 25 MG tablet Commonly known as: APRESOLINE Take 1 tablet (25 mg total) by mouth 2 (two) times daily.   ipratropium-albuterol 0.5-2.5 (3) MG/3ML Soln Commonly known as: DUONEB Take 3 mLs by nebulization in the morning and at bedtime.   lamoTRIgine 100 MG tablet Commonly known as: LAMICTAL Take 200 mg by mouth 2 (two) times daily.   montelukast 10 MG tablet Commonly known as: SINGULAIR TAKE 1 TABLET BY MOUTH EVERYDAY AT BEDTIME What changed: See the new instructions.   multivitamin capsule Take 1 capsule by mouth daily.   nitroGLYCERIN 0.4 MG SL tablet Commonly known as: NITROSTAT Place 1 tablet (0.4 mg total) under the tongue every 5 (five) minutes as needed for chest pain.   oxyCODONE-acetaminophen 10-325 MG tablet Commonly known as: PERCOCET Take 1 tablet by mouth every 4 (four) hours as needed for pain.   potassium chloride 8 MEQ tablet Commonly known as: KLOR-CON Take 8 mEq by mouth  daily.   Prolia 60 MG/ML Sosy injection Generic drug: denosumab Inject 60 mg into the skin every 6 (six) months.   sertraline 50 MG tablet Commonly known as: ZOLOFT Take 50 mg by mouth daily.   SYSTANE BALANCE OP Place 1 drop into both eyes daily as needed (dry eyes). Systane lubricant eye ointment   tiZANidine 4 MG tablet Commonly known as: ZANAFLEX Take 4 mg by mouth 4 (four) times daily as needed for muscle spasms.   valsartan 320 MG tablet Commonly known as: DIOVAN TAKE 1 TABLET BY MOUTH EVERY DAY   Vitamin D3 50 MCG (2000 UT) Tabs Take 2,000 Units by mouth daily.       Allergies  Allergen Reactions  . Doxycycline Diarrhea  . Gabapentin Other (See Comments)    sleepiness    Consultations: n/a  Discharge Exam: BP (!) 166/82   Pulse  90   Temp 98.5 F (36.9 C) (Oral)   Resp 16   Ht 5\' 3"  (1.6 m)   Wt 81.5 kg   SpO2 92%   BMI 31.83 kg/m  General appearance: alert, cooperative and no distress Head: Normocephalic, without obvious abnormality, atraumatic Eyes: EOMI Lungs: more clear breath sounds bilaterally.  No wheezing Heart: regular rate and rhythm and S1, S2 normal Abdomen: normal findings: bowel sounds normal and soft, non-tender Extremities: No edema in LE Skin: mobility and turgor normal Neurologic: Grossly normal  The results of significant diagnostics from this hospitalization (including imaging, microbiology, ancillary and laboratory) are listed below for reference.   Microbiology: Recent Results (from the past 240 hour(s))  SARS CORONAVIRUS 2 (TAT 6-24 HRS) Nasopharyngeal Nasopharyngeal Swab     Status: None   Collection Time: 05/28/20  9:57 PM   Specimen: Nasopharyngeal Swab  Result Value Ref Range Status   SARS Coronavirus 2 NEGATIVE NEGATIVE Final    Comment: (NOTE) SARS-CoV-2 target nucleic acids are NOT DETECTED.  The SARS-CoV-2 RNA is generally detectable in upper and lower respiratory specimens during the acute phase of infection. Negative results do not preclude SARS-CoV-2 infection, do not rule out co-infections with other pathogens, and should not be used as the sole basis for treatment or other patient management decisions. Negative results must be combined with clinical observations, patient history, and epidemiological information. The expected result is Negative.  Fact Sheet for Patients: SugarRoll.be  Fact Sheet for Healthcare Providers: https://www.woods-mathews.com/  This test is not yet approved or cleared by the [place] FDA and  has been authorized for detection and/or diagnosis of SARS-CoV-2 by FDA under an Emergency Use Authorization (EUA). This EUA will remain  in effect (meaning this test can be used) for the duration of  the COVID-19 declaration under Se ction 564(b)(1) of the Act, 21 U.S.C. section 360bbb-3(b)(1), unless the authorization is terminated or revoked sooner.  Performed at [facility], [place] [address] [place], [place] [address]   MRSA PCR Screening     Status: None   Collection Time: 05/28/20 11:44 PM   Specimen: Nasopharyngeal  Result Value Ref Range Status   MRSA by PCR NEGATIVE NEGATIVE Final    Comment:        The GeneXpert MRSA Assay (FDA approved for NASAL specimens only), is one component of a comprehensive MRSA colonization surveillance program. It is not intended to diagnose MRSA infection nor to guide or monitor treatment for MRSA infections. Performed at [facility], [place] [address] [place], [place] [address]      Labs: BNP (last  3 results) Recent Labs    05/28/20 1924  BNP 123XX123*   Basic Metabolic Panel: Recent Labs  Lab 05/28/20 1924 05/29/20 0305 05/30/20 0218 05/31/20 0340  NA 140 138 137 139  K 3.8 3.6 3.3* 3.3*  CL 104 103 102 103  CO2 29 25 26 28   GLUCOSE 118* 274* 156* 116*  BUN 19 18 17 16   CREATININE 0.87 0.91 0.81 0.72  CALCIUM 10.6* 10.2 10.1 10.0  MG  --  2.1 2.2 2.2   Liver Function Tests: Recent Labs  Lab 05/28/20 1924  AST 27  ALT 21  ALKPHOS 90  BILITOT 0.5  PROT 7.6  ALBUMIN 4.2   No results for input(s): LIPASE, AMYLASE in the last 168 hours. No results for input(s): AMMONIA in the last 168 hours. CBC: Recent Labs  Lab 05/28/20 1924 05/29/20 0305 05/30/20 0218 05/31/20 0340  WBC 13.7* 14.1* 12.2* 11.7*  NEUTROABS 11.5*  --  11.3* 8.9*  HGB 15.4* 14.6 14.5 16.0*  HCT 47.8* 44.5 42.8 46.9*  MCV 97.2 94.9 95.7 95.9  PLT 265 255 254 269   Cardiac Enzymes: No results for input(s): CKTOTAL, CKMB, CKMBINDEX, TROPONINI in the last 168 hours. BNP: Invalid input(s): POCBNP CBG: No results for input(s): GLUCAP in the last 168 hours. D-Dimer No results for input(s): DDIMER in the last 72  hours. Hgb A1c No results for input(s): HGBA1C in the last 72 hours. Lipid Profile No results for input(s): CHOL, HDL, LDLCALC, TRIG, CHOLHDL, LDLDIRECT in the last 72 hours. Thyroid function studies No results for input(s): TSH, T4TOTAL, T3FREE, THYROIDAB in the last 72 hours.  Invalid input(s): FREET3 Anemia work up No results for input(s): VITAMINB12, FOLATE, FERRITIN, TIBC, IRON, RETICCTPCT in the last 72 hours. Urinalysis    Component Value Date/Time   COLORURINE STRAW (A) 05/28/2020 1924   APPEARANCEUR CLEAR 05/28/2020 1924   LABSPEC 1.006 05/28/2020 1924   PHURINE 6.0 05/28/2020 1924   GLUCOSEU NEGATIVE 05/28/2020 1924   HGBUR NEGATIVE 05/28/2020 1924   BILIRUBINUR NEGATIVE 05/28/2020 1924   KETONESUR NEGATIVE 05/28/2020 1924   PROTEINUR NEGATIVE 05/28/2020 1924   UROBILINOGEN 0.2 03/07/2012 1234   NITRITE NEGATIVE 05/28/2020 1924   LEUKOCYTESUR NEGATIVE 05/28/2020 1924   Sepsis Labs Invalid input(s): PROCALCITONIN,  WBC,  LACTICIDVEN Microbiology Recent Results (from the past 240 hour(s))  SARS CORONAVIRUS 2 (TAT 6-24 HRS) Nasopharyngeal Nasopharyngeal Swab     Status: None   Collection Time: 05/28/20  9:57 PM   Specimen: Nasopharyngeal Swab  Result Value Ref Range Status   SARS Coronavirus 2 NEGATIVE NEGATIVE Final    Comment: (NOTE) SARS-CoV-2 target nucleic acids are NOT DETECTED.  The SARS-CoV-2 RNA is generally detectable in upper and lower respiratory specimens during the acute phase of infection. Negative results do not preclude SARS-CoV-2 infection, do not rule out co-infections with other pathogens, and should not be used as the sole basis for treatment or other patient management decisions. Negative results must be combined with clinical observations, patient history, and epidemiological information. The expected result is Negative.  Fact Sheet for Patients: SugarRoll.be  Fact Sheet for Healthcare  Providers: https://www.woods-mathews.com/  This test is not yet approved or cleared by the [place] FDA and  has been authorized for detection and/or diagnosis of SARS-CoV-2 by FDA under an Emergency Use Authorization (EUA). This EUA will remain  in effect (meaning this test can be used) for the duration of the COVID-19 declaration under Se ction 564(b)(1) of the Act, 21 U.S.C. section 360bbb-3(b)(1), unless  the authorization is terminated or revoked sooner.  Performed at [facility], [place] [address], [place], [place] [address]   MRSA PCR Screening     Status: None   Collection Time: 05/28/20 11:44 PM   Specimen: Nasopharyngeal  Result Value Ref Range Status   MRSA by PCR NEGATIVE NEGATIVE Final    Comment:        The GeneXpert MRSA Assay (FDA approved for NASAL specimens only), is one component of a comprehensive MRSA colonization surveillance program. It is not intended to diagnose MRSA infection nor to guide or monitor treatment for MRSA infections. Performed at [facility], [place] [address] [place], [place] [address]     Procedures/Studies: DG Chest Port 1 View  Result Date: 05/28/2020 CLINICAL DATA:  Shortness of breath. EXAM: PORTABLE CHEST 1 VIEW COMPARISON:  September 02, 2019 FINDINGS: Cardiomegaly. Bilateral pulmonary opacities are identified, relatively symmetric. The hila and mediastinum are unremarkable. No pneumothorax. No other abnormalities. IMPRESSION: Findings are most consistent with cardiomegaly and pulmonary edema. Bilateral pneumonia is not completely excluded. Recommend clinical correlation. Electronically Signed   By: Dorise Bullion III M.D   On: 05/28/2020 19:26   ECHOCARDIOGRAM COMPLETE  Result Date: 05/29/2020    ECHOCARDIOGRAM REPORT   Patient Name:   [name] [facility] Date of Exam: 05/29/2020 Medical Rec #:  [id number]        Height:       63.0 in Accession #:    [id number]       Weight:       179.7 lb Date of  Birth:  1945/12/30        BSA:          1.848 m Patient Age:    10 years         BP:           145/113 mmHg Patient Gender: F                HR:           95 bpm. Exam Location:  Inpatient Procedure: 2D Echo, Cardiac Doppler and Color Doppler Indications:    I50.33 Acute on chronic diastolic (congestive) heart failure  History:        Patient has prior history of Echocardiogram examinations, most                 recent 03/20/2019. Previous Myocardial Infarction and CAD, COPD;                 Risk Factors:Hypertension. GERD. Alcoholism.  Sonographer:    Jonelle Sidle Dance Referring Phys: [id number] [place]  1. Left ventricular ejection fraction, by estimation, is 55%. The left ventricle has normal function. The left ventricle demonstrates regional wall motion abnormalities with basal inferior severe hypokinesis. There is mild left ventricular hypertrophy. Left ventricular diastolic parameters are consistent with Grade I diastolic dysfunction (impaired relaxation).  2. Right ventricular systolic function is normal. The right ventricular size is normal. There is normal pulmonary artery systolic pressure. The estimated right ventricular systolic pressure is [id number] mmHg.  3. Left atrial size was mildly dilated.  4. Right atrial size was mildly dilated.  5. The mitral valve is degenerative. Mild mitral valve regurgitation. No evidence of mitral stenosis. Moderate mitral annular calcification.  6. The aortic valve is tricuspid. Aortic valve regurgitation is not visualized. Mild aortic valve sclerosis is present, with no evidence of aortic valve stenosis.  7. The inferior vena cava is  normal in size with greater than 50% respiratory variability, suggesting right atrial pressure of 3 mmHg. FINDINGS  Left Ventricle: Left ventricular ejection fraction, by estimation, is 55%. The left ventricle has normal function. The left ventricle demonstrates regional wall motion abnormalities. The left ventricular internal cavity  size was normal in size. There is  mild left ventricular hypertrophy. Left ventricular diastolic parameters are consistent with Grade I diastolic dysfunction (impaired relaxation). Right Ventricle: The right ventricular size is normal. No increase in right ventricular wall thickness. Right ventricular systolic function is normal. There is normal pulmonary artery systolic pressure. The tricuspid regurgitant velocity is 2.71 m/s, and  with an assumed right atrial pressure of 3 mmHg, the estimated right ventricular systolic pressure is 16.1 mmHg. Left Atrium: Left atrial size was mildly dilated. Right Atrium: Right atrial size was mildly dilated. Pericardium: There is no evidence of pericardial effusion. Mitral Valve: The mitral valve is degenerative in appearance. There is mild calcification of the mitral valve leaflet(s). Moderate mitral annular calcification. Mild mitral valve regurgitation. No evidence of mitral valve stenosis. Tricuspid Valve: The tricuspid valve is normal in structure. Tricuspid valve regurgitation is trivial. Aortic Valve: The aortic valve is tricuspid. Aortic valve regurgitation is not visualized. Mild aortic valve sclerosis is present, with no evidence of aortic valve stenosis. Pulmonic Valve: The pulmonic valve was normal in structure. Pulmonic valve regurgitation is not visualized. Aorta: The aortic root is normal in size and structure. Venous: The inferior vena cava is normal in size with greater than 50% respiratory variability, suggesting right atrial pressure of 3 mmHg. IAS/Shunts: No atrial level shunt detected by color flow Doppler.  LEFT VENTRICLE PLAX 2D LVIDd:         5.20 cm LVIDs:         3.00 cm LV PW:         1.30 cm LV IVS:        0.90 cm LVOT diam:     2.20 cm LV SV:         102 LV SV Index:   55 LVOT Area:     3.80 cm  RIGHT VENTRICLE             IVC RV Basal diam:  3.20 cm     IVC diam: 1.80 cm RV Mid diam:    2.10 cm RV S prime:     13.60 cm/s TAPSE (M-mode): 2.3 cm LEFT  ATRIUM             Index       RIGHT ATRIUM           Index LA diam:        4.00 cm 2.17 cm/m  RA Area:     20.40 cm LA Vol (A2C):   51.1 ml 27.66 ml/m RA Volume:   58.50 ml  31.66 ml/m LA Vol (A4C):   72.4 ml 39.19 ml/m LA Biplane Vol: 66.8 ml 36.16 ml/m  AORTIC VALVE LVOT Vmax:   130.00 cm/s LVOT Vmean:  83.100 cm/s LVOT VTI:    0.268 m  AORTA Ao Root diam: 3.10 cm Ao Asc diam:  3.70 cm MITRAL VALVE                TRICUSPID VALVE MV Area (PHT): 2.99 cm     TR Peak grad:   29.4 mmHg MV Decel Time: 254 msec     TR Vmax:        271.00 cm/s MV E velocity: 75.30 cm/s  MV A velocity: 111.00 cm/s  SHUNTS MV E/A ratio:  0.68         Systemic VTI:  0.27 m                             Systemic Diam: 2.20 cm Loralie Champagne MD Electronically signed by Loralie Champagne MD Signature Date/Time: 05/29/2020/3:49:07 PM    Final      Time coordinating discharge: Over 51 minutes    Dwyane Dee, MD  [facility] 05/31/2020, 3:36 PM

## 2020-05-31 NOTE — Progress Notes (Signed)
Patient education provided to [facility] and her caregiver [name]. Patient and caregiver verbalized understanding of teaching. Patient taken out of hospital @ 1125.

## 2020-06-12 DIAGNOSIS — G518 Other disorders of facial nerve: Secondary | ICD-10-CM | POA: Diagnosis not present

## 2020-06-18 ENCOUNTER — Encounter: Payer: Self-pay | Admitting: Vascular Surgery

## 2020-06-18 ENCOUNTER — Ambulatory Visit (HOSPITAL_COMMUNITY)
Admission: RE | Admit: 2020-06-18 | Discharge: 2020-06-18 | Disposition: A | Discharge status: Home / Self Care | Payer: Medicare Other | Source: Ambulatory Visit | Attending: Vascular Surgery | Admitting: Vascular Surgery

## 2020-06-18 ENCOUNTER — Other Ambulatory Visit: Payer: Self-pay

## 2020-06-18 ENCOUNTER — Ambulatory Visit (INDEPENDENT_AMBULATORY_CARE_PROVIDER_SITE_OTHER): Payer: Medicare Other | Admitting: Vascular Surgery

## 2020-06-18 VITALS — BP 127/75 | HR 53 | Temp 98.1°F | Resp 20 | Ht 63.0 in | Wt 179.0 lb

## 2020-06-18 DIAGNOSIS — I6523 Occlusion and stenosis of bilateral carotid arteries: Secondary | ICD-10-CM

## 2020-06-18 DIAGNOSIS — I6521 Occlusion and stenosis of right carotid artery: Secondary | ICD-10-CM

## 2020-06-18 NOTE — Progress Notes (Signed)
Patient is a 75 year old female who returns for follow-up today.  She previously had right carotid endarterectomy 1995.  She does not remember her surgeon.  This was for an asymptomatic lesion.  She has had some recurrent stenosis.  This was 60% at the proximal patch site in November 2021 CTA.  She has had no symptoms of TIA amaurosis or stroke.  She was also noted to have a 75% stenosis of the left subclavian artery on the CT scan but has no left arm symptoms or dizziness symptoms..  There was no significant left carotid stenosis.  She has fairly significant COPD and is on home oxygen therapy.  She was recently discharged from the hospital on May 15 of this year after an episode of shortness of breath.  She is on aspirin.  Review of systems: She has no shortness of breath or chest pain today.  Past Medical History:  Diagnosis Date  . Alcoholism ([place])    recovering since 2000  . Anxiety and depression   . Arthritis BACK  . Bipolar disorder ([place])   . Blepharospasm LEFT EYE  . CHF exacerbation ([place]) 02/24/2017  . Chronic back pain   . COPD (chronic obstructive pulmonary disease) ([place])   . Coronary artery disease CARDIOLOGIST- DR Martinique--- LAST VISIT NOTE 09-07-2009  W/ CHART  . Emphysema   . GERD (gastroesophageal reflux disease)   . History of alcohol abuse RECOVERING SINCE 2000  . History of Left renal mass 03/02/2012   underwent partial nephrectomy  . HTN (hypertension)   . Idiopathic acute facial nerve palsy LEFT SIDE--  BOTOX THERAPY  . Inferior MI ([place]) 2000--  POST PTCA W/ STENT X1  . Osteoporosis   . Other and unspecified general anesthetics causing adverse effect in therapeutic use post op delirium--  last anes record w/ chart  from   09-22-2009 (spinal w/ light sedation)  . Peripheral vascular disease ([place]) POST RIGHT CAROTID SURG.  1995  . Renal cell carcinoma ([place]) 07/09/12   Left mass  . Rosacea LEFT FACIAL RASH  . S/P radiation therapy 02/21/2012   38.75 Gy HDR 5 Fractions-  vaginal cuff  . Scoliosis   . Seizures ([place])    x 1 after abrupt discontinuation of  Clonidine  . Status post carotid endarterectomy RIGHT --  1995  . Status post primary angioplasty with coronary stent 2000--  POST INFERIOR MI  . Unstable balance WALKS W/ CANE  . Vaginal cancer [facility])     Past Surgical History:  Procedure Laterality Date  . BREAST EXCISIONAL BIOPSY Left 2019   b9 axilla Bx X 3  . CAROTID ENDARTERECTOMY  1995   RIGHT  . CATARACT EXTRACTION W/ INTRAOCULAR LENS  IMPLANT, BILATERAL    . CERVICAL CONIZATION W/BX  09-23-2008  . CORONARY ANGIOPLASTY WITH STENT PLACEMENT  2000-   INFERIOR MI   X1 STENT TO RCA  . EUS N/A 03/05/2012   Procedure: FULL UPPER ENDOSCOPIC ULTRASOUND (EUS) RADIAL and EGD;  Surgeon: Milus Banister, MD;  Location: [facility];  Service: Endoscopy;  Laterality: N/A;  ercp scope first than eus scope  . HEMIARTHROPLASTY HIP  12-26-2008   LEFT FEMORAL NECK FX  . ORIF HIP FRACTURE  02-13-2007   RIGHT FEMORAL NECK FX  . ORIF RIGHT DISTAL RADIUS AND RIGHT PROXIMAL HUMEROUS NECK FX'S  10-10-2005  . RIGHT SHOULDER SURG.  2007  . ROBOTIC ASSITED PARTIAL NEPHRECTOMY Left 07/09/2012   Procedure: ROBOTIC ASSITED PARTIAL NEPHRECTOMY;  Surgeon: Dutch Gray, MD;  Location: [facility];  Service: Urology;  Laterality: Left;  . TOTAL HIP ARTHROPLASTY  04-15-2008   POST FAILED  RIGHT HIP ORIF FEMORAL FX  . TOTAL KNEE ARTHROPLASTY  09-22-2009   RIGHT  . UPPER RIGHT VAGINAL REGION  12/28/11   BIOPSY: SQUAMOUS CELL CARCINOMA  . VAGINAL HYSTERECTOMY  07/06/2009   Secondary to dysplasia    Current Outpatient Medications on File Prior to Visit  Medication Sig Dispense Refill  . albuterol (PROVENTIL HFA;VENTOLIN HFA) 108 (90 BASE) MCG/ACT inhaler Inhale 2 puffs into the lungs every 6 (six) hours as needed for wheezing.    . ARIPiprazole (ABILIFY) 20 MG tablet Take 10 mg by mouth daily.     Marland Kitchen aspirin EC 81 MG tablet Take 81 mg by mouth every morning.     .  Budeson-Glycopyrrol-Formoterol (BREZTRI AEROSPHERE) 160-9-4.8 MCG/ACT AERO Inhale 2 puffs into the lungs in the morning and at bedtime. 10.7 g 5  . CADUET 10-20 MG tablet TAKE 1 TABLET BY MOUTH DAILY. (Patient taking differently: Take 1 tablet by mouth daily.) 90 tablet 2  . Calcium Carb-Cholecalciferol 600-400 MG-UNIT TABS Take 1 tablet by mouth daily.    . celecoxib (CELEBREX) 200 MG capsule Take 200 mg by mouth daily as needed for mild pain.     . Cholecalciferol (VITAMIN D3) 2000 UNITS TABS Take 2,000 Units by mouth daily.     Marland Kitchen denosumab (PROLIA) 60 MG/ML SOSY injection Inject 60 mg into the skin every 6 (six) months.    . dexlansoprazole (DEXILANT) 60 MG capsule Take 60 mg by mouth daily.    Marland Kitchen docusate sodium (COLACE) 100 MG capsule Take 100 mg by mouth daily as needed for mild constipation.    . ferrous sulfate 325 (65 FE) MG tablet Take 325 mg by mouth daily.    . furosemide (LASIX) 20 MG tablet Take 1 tablet (20 mg total) by mouth daily. 30 tablet 3  . hydrALAZINE (APRESOLINE) 25 MG tablet Take 1 tablet (25 mg total) by mouth 2 (two) times daily. 270 tablet 3  . ipratropium-albuterol (DUONEB) 0.5-2.5 (3) MG/3ML SOLN Take 3 mLs by nebulization in the morning and at bedtime. 360 mL 11  . lamoTRIgine (LAMICTAL) 100 MG tablet Take 200 mg by mouth 2 (two) times daily.    . montelukast (SINGULAIR) 10 MG tablet TAKE 1 TABLET BY MOUTH EVERYDAY AT BEDTIME (Patient taking differently: Take 10 mg by mouth at bedtime.) 90 tablet 1  . Multiple Vitamin (MULTIVITAMIN) capsule Take 1 capsule by mouth daily.     . nitroGLYCERIN (NITROSTAT) 0.4 MG SL tablet Place 1 tablet (0.4 mg total) under the tongue every 5 (five) minutes as needed for chest pain. 25 tablet 3  . oxyCODONE-acetaminophen (PERCOCET) 10-325 MG per tablet Take 1 tablet by mouth every 4 (four) hours as needed for pain.     . potassium chloride (KLOR-CON) 8 MEQ tablet Take 8 mEq by mouth daily.     Marland Kitchen Propylene Glycol (SYSTANE BALANCE OP) Place  1 drop into both eyes daily as needed (dry eyes). Systane lubricant eye ointment    . sertraline (ZOLOFT) 50 MG tablet Take 50 mg by mouth daily.    Marland Kitchen tiZANidine (ZANAFLEX) 4 MG tablet Take 4 mg by mouth 4 (four) times daily as needed for muscle spasms.     . valsartan (DIOVAN) 320 MG tablet TAKE 1 TABLET BY MOUTH EVERY DAY (Patient taking differently: Take 320 mg by mouth daily.) 90 tablet 2   No current facility-administered  medications on file prior to visit.   Physical exam:  Vitals:   06/18/20 1346 06/18/20 1348  BP: 138/78 127/75  Pulse: (!) 53   Resp: 20   Temp: 98.1 F (36.7 C)   SpO2: 95%   Weight: 179 lb (81.2 kg)   Height: 5\' 3"  (1.6 m)     Neck: Well-healed carotid scar right side  Neuro: Symmetric upper extremity lower extremity motor strength 5/5 she has some facial droop on the left side which is chronic  Data: Patient had a carotid duplex exam today which showed no significant left-sided stenosis no suggestion of subclavian stenosis and right carotid stenosis previously noted at the proximal patch was greater than 50% by velocity slightly lower than at her previous office visit last year.  Velocities were 424/66 compared to 579/107 previously  Assessment: Moderate recurrent right internal carotid artery stenosis currently asymptomatic  Plan: Continue aspirin  Patient will follow up in 6 months time in our APP clinic with repeat carotid duplex scan  Ruta Hinds, MD [facility]: [phone]

## 2020-06-18 NOTE — Progress Notes (Signed)
Cardiology Office Note   Date:  06/19/2020   ID:  [name], DOB 06-24-1945, MRN [id number]  PCP:  Jade Stains, MD  Cardiologist:  Dr. Martinique  CC: Hospital Follow Up   History of Present Illness: [name] is a 75 y.o. female who presents for ongoing assessment and management of coronary artery disease with history of inferior MI and PCI to Jade RCA in 2000, chronic combined CHF with most recent echocardiogram in May 2022 revealing an EF of 55%, and grade 1 diastolic dysfunction., carotid artery stenosis status post R CEA in 1995, peripheral arterial disease with moderate bilateral femoropopliteal disease treated medically (followed by Dr. Ruta Martinez), hypertension, hyperlipidemia, with other history to include COPD, renal CA status post partial left nephrectomy in 2014.  Dr. Martinique also documented on last visit with him dated 06/06/2019 that her last ischemic evaluation was in Feb 2021 which revealed an EF of 51% but also showed a fixed defect in Jade inferior lateral wall which was felt to be reflective of Jade prior MI.  [name] was recently discharged from [facility] on 05/31/2020 after admission for COPD exacerbation, and pulmonary edema.  She was treated with IV Lasix, Solu-Medrol, and breathing treatments.  It was felt that she had also had decompensated CHF.  She was discharged on as needed oxygen at home.  She is here for follow-up concerning her status post discharge.  She comes today feeling well but is concerned about her abdomen feeling tight.  Her oxygen sat when she arrived here today was 88% and therefore our nurses placed her on oxygen via nasal cannula at 2 L.  She states she is feeling much better since discharge and is able to breathe better.  She normally sleeps in a recliner and not have symptoms of PND. She was unaware of Jade need to weigh her self daily or Jade salty foods that she had been eating which may have contributed to heart failure.  She denies  chest pain palpitations dizziness.  She is not very active due to her breathing status.  Past Medical History:  Diagnosis Date  . Alcoholism ([place])    recovering since 2000  . Anxiety and depression   . Arthritis BACK  . Bipolar disorder ([place])   . Blepharospasm LEFT EYE  . CHF exacerbation ([place]) 02/24/2017  . Chronic back pain   . COPD (chronic obstructive pulmonary disease) ([place])   . Coronary artery disease CARDIOLOGIST- DR [name]--- LAST VISIT NOTE 09-07-2009  W/ CHART  . Emphysema   . GERD (gastroesophageal reflux disease)   . History of alcohol abuse RECOVERING SINCE 2000  . History of Left renal mass 03/02/2012   underwent partial nephrectomy  . HTN (hypertension)   . Idiopathic acute facial nerve palsy LEFT SIDE--  BOTOX THERAPY  . Inferior MI ([place]) 2000--  POST PTCA W/ STENT X1  . Osteoporosis   . Other and unspecified general anesthetics causing adverse effect in therapeutic use post op delirium--  last anes record w/ chart  from   09-22-2009 (spinal w/ light sedation)  . Peripheral vascular disease ([place]) POST RIGHT CAROTID SURG.  1995  . Renal cell carcinoma ([place]) 07/09/12   Left mass  . Rosacea LEFT FACIAL RASH  . S/P radiation therapy 02/21/2012   38.75 Gy HDR 5 Fractions- vaginal cuff  . Scoliosis   . Seizures ([place])    x 1 after abrupt discontinuation of  Clonidine  . Status post carotid endarterectomy RIGHT --  1995  . Status post primary angioplasty with coronary stent 2000--  POST INFERIOR MI  . Unstable balance WALKS W/ CANE  . Vaginal cancer [facility])     Past Surgical History:  Procedure Laterality Date  . BREAST EXCISIONAL BIOPSY Left 2019   b9 axilla Bx X 3  . CAROTID ENDARTERECTOMY  1995   RIGHT  . CATARACT EXTRACTION W/ INTRAOCULAR LENS  IMPLANT, BILATERAL    . CERVICAL CONIZATION W/BX  09-23-2008  . CORONARY ANGIOPLASTY WITH STENT PLACEMENT  2000-   INFERIOR MI   X1 STENT TO RCA  . EUS N/A 03/05/2012   Procedure: FULL UPPER ENDOSCOPIC ULTRASOUND (EUS)  RADIAL and EGD;  Surgeon: Jade Banister, MD;  [facility];  Service: Endoscopy;  Laterality: N/A;  ercp scope first than eus scope  . HEMIARTHROPLASTY HIP  12-26-2008   LEFT FEMORAL NECK FX  . ORIF HIP FRACTURE  02-13-2007   RIGHT FEMORAL NECK FX  . ORIF RIGHT DISTAL RADIUS AND RIGHT PROXIMAL HUMEROUS NECK FX'S  10-10-2005  . RIGHT SHOULDER SURG.  2007  . ROBOTIC ASSITED PARTIAL NEPHRECTOMY Left 07/09/2012   Procedure: ROBOTIC ASSITED PARTIAL NEPHRECTOMY;  Surgeon: Jade Gray, MD;  Location[facility];  Service: Urology;  Laterality: Left;  . TOTAL HIP ARTHROPLASTY  04-15-2008   POST FAILED  RIGHT HIP ORIF FEMORAL FX  . TOTAL KNEE ARTHROPLASTY  09-22-2009   RIGHT  . UPPER RIGHT VAGINAL REGION  12/28/11   BIOPSY: SQUAMOUS CELL CARCINOMA  . VAGINAL HYSTERECTOMY  07/06/2009   Secondary to dysplasia     Current Outpatient Medications  Medication Sig Dispense Refill  . albuterol (PROVENTIL HFA;VENTOLIN HFA) 108 (90 BASE) MCG/ACT inhaler Inhale 2 puffs into Jade lungs every 6 (six) hours as needed for wheezing.    . ARIPiprazole (ABILIFY) 20 MG tablet Take 10 mg by mouth daily.     Marland Kitchen aspirin EC 81 MG tablet Take 81 mg by mouth every morning.     . Budeson-Glycopyrrol-Formoterol (BREZTRI AEROSPHERE) 160-9-4.8 MCG/ACT AERO Inhale 2 puffs into Jade lungs in Jade morning and at bedtime. 10.7 g 5  . CADUET 10-20 MG tablet TAKE 1 TABLET BY MOUTH DAILY. (Martinez taking differently: Take 1 tablet by mouth daily.) 90 tablet 2  . Calcium Carb-Cholecalciferol 600-400 MG-UNIT TABS Take 1 tablet by mouth daily.    . celecoxib (CELEBREX) 200 MG capsule Take 200 mg by mouth daily as needed for mild pain.     . Cholecalciferol (VITAMIN D3) 2000 UNITS TABS Take 2,000 Units by mouth daily.     Marland Kitchen denosumab (PROLIA) 60 MG/ML SOSY injection Inject 60 mg into Jade skin every 6 (six) months.    . dexlansoprazole (DEXILANT) 60 MG capsule Take 60 mg by mouth daily.    Marland Kitchen docusate sodium (COLACE) 100 MG capsule  Take 100 mg by mouth daily as needed for mild constipation.    . ferrous sulfate 325 (65 FE) MG tablet Take 325 mg by mouth daily.    . furosemide (LASIX) 20 MG tablet Take 1 tablet (20 mg total) by mouth daily. 30 tablet 3  . ipratropium-albuterol (DUONEB) 0.5-2.5 (3) MG/3ML SOLN Take 3 mLs by nebulization in Jade morning and at bedtime. 360 mL 11  . lamoTRIgine (LAMICTAL) 100 MG tablet Take 200 mg by mouth 2 (two) times daily.    . montelukast (SINGULAIR) 10 MG tablet TAKE 1 TABLET BY MOUTH EVERYDAY AT BEDTIME (Martinez taking differently: Take 10 mg by mouth at bedtime.) 90 tablet 1  .  Multiple Vitamin (MULTIVITAMIN) capsule Take 1 capsule by mouth daily.     . nitroGLYCERIN (NITROSTAT) 0.4 MG SL tablet Place 1 tablet (0.4 mg total) under Jade tongue every 5 (five) minutes as needed for chest pain. 25 tablet 3  . oxyCODONE-acetaminophen (PERCOCET) 10-325 MG per tablet Take 1 tablet by mouth every 4 (four) hours as needed for pain.     . potassium chloride (KLOR-CON) 8 MEQ tablet Take 8 mEq by mouth daily.     . sertraline (ZOLOFT) 50 MG tablet Take 50 mg by mouth daily.    Marland Kitchen tiZANidine (ZANAFLEX) 4 MG tablet Take 4 mg by mouth 4 (four) times daily as needed for muscle spasms.     . valsartan (DIOVAN) 320 MG tablet TAKE 1 TABLET BY MOUTH EVERY DAY (Martinez taking differently: Take 320 mg by mouth daily.) 90 tablet 2  . hydrALAZINE (APRESOLINE) 25 MG tablet Take 1 tablet (25 mg total) by mouth 2 (two) times daily. 270 tablet 3   No current facility-administered medications for this visit.    Allergies:   Doxycycline and Gabapentin    Social History:  [name]  reports that she quit smoking about 10 years ago. Her smoking use included cigarettes. She has a 100.00 pack-year smoking history. She has never used smokeless tobacco. She reports previous alcohol use. She reports that she does not use drugs.   Family History:  [name]'s family history includes Cancer in her maternal grandfather;  Hypertension in her father, mother, and sister.    ROS: All other systems are reviewed and negative. Unless otherwise mentioned in H&P    PHYSICAL EXAM: VS:  BP 132/78   Pulse 63   Ht 5\' 3"  (1.6 m)   Wt 179 lb (81.2 kg)   SpO2 (!) 88%   BMI 31.71 kg/m  , BMI Body mass index is 31.71 kg/m. GEN: Well nourished, well developed, in no acute distress HEENT: normal Neck: no JVD, carotid bruits, or masses Cardiac:RRR; no murmurs, rubs, or gallops,no edema  Respiratory:  Clear to auscultation bilaterally, normal work of breathing, wearing oxygen at 2 L. GI: soft, nontender, nondistended, + BS MS: Kyphosis with scoliosis noted Skin: warm and dry, no rash Neuro:  Strength and sensation are intact Psych: euthymic mood, full affect   EKG: Not completed this office visit also also   Recent Labs: 09/02/2019: Pro B Natriuretic peptide (BNP) 166.0 05/28/2020: ALT 21; B Natriuretic Peptide 222.4 05/31/2020: BUN 16; Creatinine, Ser 0.72; Hemoglobin 16.0; Magnesium 2.2; Platelets 269; Potassium 3.3; Sodium 139    Lipid Panel    Component Value Date/Time   CHOL  12/25/2008 0550    161        ATP III CLASSIFICATION:  <200     mg/dL   Desirable  200-239  mg/dL   Borderline High  >=240    mg/dL   High          TRIG 39 12/25/2008 0550   HDL 102 12/25/2008 0550   CHOLHDL 1.6 12/25/2008 0550   VLDL 8 12/25/2008 0550   LDLCALC  12/25/2008 0550    51        Total Cholesterol/HDL:CHD Risk Coronary Heart Disease Risk Table                     Men   Women  1/2 Average Risk   3.4   3.3  Average Risk       5.0   4.4  2 X  Average Risk   9.6   7.1  3 X Average Risk  23.4   11.0        Use Jade calculated Martinez Ratio above and Jade CHD Risk Table to determine [name]'s CHD Risk.        ATP III CLASSIFICATION (LDL):  <100     mg/dL   Optimal  100-129  mg/dL   Near or Above                    Optimal  130-159  mg/dL   Borderline  160-189  mg/dL   High  >190     mg/dL   Very High       Wt Readings from Last 3 Encounters:  06/19/20 179 lb (81.2 kg)  06/18/20 179 lb (81.2 kg)  06-24-2020 179 lb 10.8 oz (81.5 kg)      Other studies Reviewed: Echocardiogram June 24, 2020 1. Left ventricular ejection fraction, by estimation, is 55%. Jade left  ventricle has normal function. Jade left ventricle demonstrates regional  wall motion abnormalities with basal inferior severe hypokinesis. There is  mild left ventricular hypertrophy.  Left ventricular diastolic parameters are consistent with Grade I  diastolic dysfunction (impaired relaxation).  2. Right ventricular systolic function is normal. Jade right ventricular  size is normal. There is normal pulmonary artery systolic pressure. Jade  estimated right ventricular systolic pressure is 93.8 mmHg.  3. Left atrial size was mildly dilated.  4. Right atrial size was mildly dilated.  5. Jade mitral valve is degenerative. Mild mitral valve regurgitation. No  evidence of mitral stenosis. Moderate mitral annular calcification.  6. Jade aortic valve is tricuspid. Aortic valve regurgitation is not  visualized. Mild aortic valve sclerosis is present, with no evidence of  aortic valve stenosis.  7. Jade inferior vena cava is normal in size with greater than 50%  respiratory variability, suggesting right atrial pressure of 3 mmHg.   ASSESSMENT AND PLAN:  1.  Chronic diastolic CHF: Noted during recent hospitalization for respiratory failure and COPD exacerbation.  I have counseled her on daily weight, and salt avoidance.  She is now on Lasix 20 mg daily.  In Jade past she had only taken it as needed.  She does have some abdominal distention on assessment which may be related to Jade oxygen.  I have advised her about gaining weight and daily weights and Jade need to take an extra dose of Lasix should she gain 3 to 5 pounds in 24 to 48 hours.  She is given a weight documentation sheet so that she can keep up with her weight each time.  If she is  having to take an extra dose of 20 mg more than twice a week we may have to adjust her dosing.  She is also given Jade "Salty 6" as a way of guiding her away from heavily salted foods.  2.  Oxygen dependent COPD: She was placed on O2 via nasal cannula to be used as needed.  We have had to place her on oxygen here in Jade clinic as her O2 sats were ranging around 87% to 88%.  She does not use her portable oxygen as she is unable to understand how to use it.  She has someone coming over to give her instruction.  3.  Coronary artery disease: She denies complaints of chest discomfort or angina symptoms.  Her main concerns revolve around COPD symptoms.  She will continue secondary prevention.  4.  Hypertension:  Blood pressures currently very well controlled on medication regimen which includes hydralazine 25 mg twice daily and valsartan 320 mg daily.  No changes in her regimen.    Current medicines are reviewed at length with [name] today.  I have spent 25 minutes dedicated to Jade care of this Martinez on Jade date of this encounter to include pre-visit review of records, assessment, management and diagnostic testing,with shared decision making.  Labs/ tests ordered today include: None-is seeing PCP next week for labs.  Phill Myron. [place] Pugh, ANP, AACC   06/19/2020 3:44 PM    [facility] [address] Office [phone] Fax [phone]  Notice: This dictation was prepared with Dragon dictation along with smaller phrase technology. Any transcriptional errors that result from this process are unintentional and may not be corrected upon review.

## 2020-06-19 ENCOUNTER — Encounter: Payer: Self-pay | Admitting: Adult Health

## 2020-06-19 ENCOUNTER — Ambulatory Visit (INDEPENDENT_AMBULATORY_CARE_PROVIDER_SITE_OTHER): Payer: Medicare Other | Admitting: Adult Health

## 2020-06-19 VITALS — BP 132/78 | HR 63 | Ht 63.0 in | Wt 179.0 lb

## 2020-06-19 DIAGNOSIS — I10 Essential (primary) hypertension: Secondary | ICD-10-CM | POA: Diagnosis not present

## 2020-06-19 DIAGNOSIS — I6523 Occlusion and stenosis of bilateral carotid arteries: Secondary | ICD-10-CM | POA: Diagnosis not present

## 2020-06-19 DIAGNOSIS — J441 Chronic obstructive pulmonary disease with (acute) exacerbation: Secondary | ICD-10-CM

## 2020-06-19 DIAGNOSIS — I5032 Chronic diastolic (congestive) heart failure: Secondary | ICD-10-CM

## 2020-06-19 DIAGNOSIS — I2510 Atherosclerotic heart disease of native coronary artery without angina pectoris: Secondary | ICD-10-CM | POA: Diagnosis not present

## 2020-06-19 DIAGNOSIS — I739 Peripheral vascular disease, unspecified: Secondary | ICD-10-CM | POA: Diagnosis not present

## 2020-06-19 NOTE — Patient Instructions (Signed)
Medication Instructions:  No Changes *If you need a refill on your cardiac medications before your next appointment, please call your pharmacy*   Lab Work: No Labs If you have labs (blood work) drawn today and your tests are completely normal, you will receive your results only by: Marland Kitchen MyChart Message (if you have MyChart) OR . A paper copy in the mail If you have any lab test that is abnormal or we need to change your treatment, we will call you to review the results.   Testing/Procedures: No Testing   Follow-Up: At [facility], you and your health needs are our priority.  As part of our continuing mission to provide you with exceptional heart care, we have created designated Provider Care Teams.  These Care Teams include your primary Cardiologist (physician) and Advanced Practice Providers (APPs -  Physician Assistants and Nurse Practitioners) who all work together to provide you with the care you need, when you need it.  Your next appointment:   3 month(s)  The format for your next appointment:   In Person  Provider:   Peter Martinique, MD   Other Instructions Heart Failure Education:  Weigh yourself EVERY morning after you go to the bathroom but before you eat or drink anything. Write this number down in a weight log/diary. If you gain 3 pounds overnight or 5 pounds in a week, call the office. Take your medicines as prescribed. If you have concerns about your medications, please call us before you stop taking them. Eat low salt foods--Limit salt (sodium) to 2000 mg per day. This will help prevent your body from holding onto fluid. Read food labels as many processed foods have a lot of sodium, especially canned goods and prepackaged meats. If you would like some assistance choosing low sodium foods, we would be happy to set you up with a nutritionist. Limit all fluids for the day to less than 2 liters (64 ounces). Fluid includes all drinks, coffee, juice, ice chips, soup, jello,  and all other liquids. Stay as active as you can everyday. Staying active will give you more energy and make your muscles stronger. Start with 5 minutes at a time and work your way up to 30 minutes a day. Break up your activities--do some in the morning and some in the afternoon. Start with 3 days per week and work your way up to 5 days as you can.  If you have chest pain, feel short of breath, dizzy, or lightheaded, STOP. If you don't feel better after a short rest, call 911. If you do feel better, call the office to let us know you have symptoms with exercise.

## 2020-06-22 DIAGNOSIS — I739 Peripheral vascular disease, unspecified: Secondary | ICD-10-CM | POA: Diagnosis not present

## 2020-06-22 DIAGNOSIS — J9611 Chronic respiratory failure with hypoxia: Secondary | ICD-10-CM | POA: Diagnosis not present

## 2020-06-22 DIAGNOSIS — I10 Essential (primary) hypertension: Secondary | ICD-10-CM | POA: Diagnosis not present

## 2020-06-22 DIAGNOSIS — L989 Disorder of the skin and subcutaneous tissue, unspecified: Secondary | ICD-10-CM | POA: Diagnosis not present

## 2020-06-22 DIAGNOSIS — M810 Age-related osteoporosis without current pathological fracture: Secondary | ICD-10-CM | POA: Diagnosis not present

## 2020-06-22 DIAGNOSIS — I5042 Chronic combined systolic (congestive) and diastolic (congestive) heart failure: Secondary | ICD-10-CM | POA: Diagnosis not present

## 2020-06-22 DIAGNOSIS — F319 Bipolar disorder, unspecified: Secondary | ICD-10-CM | POA: Diagnosis not present

## 2020-06-23 ENCOUNTER — Other Ambulatory Visit: Payer: Self-pay

## 2020-06-23 DIAGNOSIS — I6523 Occlusion and stenosis of bilateral carotid arteries: Secondary | ICD-10-CM

## 2020-06-23 DIAGNOSIS — I6521 Occlusion and stenosis of right carotid artery: Secondary | ICD-10-CM

## 2020-06-25 ENCOUNTER — Ambulatory Visit (INDEPENDENT_AMBULATORY_CARE_PROVIDER_SITE_OTHER): Payer: Medicare Other | Admitting: Pulmonary Disease

## 2020-06-25 ENCOUNTER — Other Ambulatory Visit: Payer: Self-pay

## 2020-06-25 ENCOUNTER — Encounter: Payer: Self-pay | Admitting: Pulmonary Disease

## 2020-06-25 VITALS — BP 122/70 | HR 65 | Temp 97.6°F | Ht 63.0 in | Wt 176.1 lb

## 2020-06-25 DIAGNOSIS — J9611 Chronic respiratory failure with hypoxia: Secondary | ICD-10-CM

## 2020-06-25 DIAGNOSIS — Z87891 Personal history of nicotine dependence: Secondary | ICD-10-CM | POA: Diagnosis not present

## 2020-06-25 DIAGNOSIS — R911 Solitary pulmonary nodule: Secondary | ICD-10-CM

## 2020-06-25 DIAGNOSIS — J432 Centrilobular emphysema: Secondary | ICD-10-CM

## 2020-06-25 DIAGNOSIS — R9389 Abnormal findings on diagnostic imaging of other specified body structures: Secondary | ICD-10-CM

## 2020-06-25 DIAGNOSIS — I5032 Chronic diastolic (congestive) heart failure: Secondary | ICD-10-CM

## 2020-06-25 NOTE — Patient Instructions (Signed)
Thank you for visiting Dr. Valeta Harms at [facility]. Today we recommend the following:  Follow up LDCT in Feb 2023 per LDCT program Continue [facility] using spacer Call us if symptoms change  Return in about 6 months (around 12/25/2020).    Please do your part to reduce the spread of COVID-19.

## 2020-06-25 NOTE — Progress Notes (Signed)
Synopsis: Referred in December 2021 for establish care with new primary pulmonologist, lung nodule, PCP: By Harlan Stains, MD  Subjective:   PATIENT ID: [name] GENDER: female DOB: Jan 09, 1946, MRN: [id number]  Chief Complaint  Patient presents with   Follow-up    Pt was hospitalized with CHF on 05/12 and she stated that her breathing is still not better. She stated that walking/exertion makes her very winded and when she rests this does get some better.     This 75 year old female, history of coronary artery disease, longstanding history of smoking, history of renal cell carcinoma, hypertension.  Patient enrolled in our lung cancer screening program had abnormal lung cancer screening CT.  Prior pulmonary function tests with mild obstruction.  CT imaging though has severe emphysema.  06/25/2020 OV: Here today for follow-up for her severe emphysema as well as lung nodule.  She had a repeat noncontrasted CT scan of the chest in February9/20/2022: Here today for follow-up of 2022.  This revealed complete resolution of the small 7.8 mm nodule that was seen in October.  Suspected to be an inflammatory problem.  She is going to plan to reenroll in her annual lung cancer screening program in February 2023.  From a respiratory standpoint she has been doing well on her new triple therapy inhaler regimen and.  She would like to stay on this.  No refills needed at this time.  She is present today in the office with her caregiver.  She is breathing better.  She does use her nebulizer twice a day.  Unfortunately she did have a recent exacerbation that required hospitalization.  She was treated with antibiotics and steroids in May.   Past Medical History:  Diagnosis Date   Alcoholism ([place])    recovering since 2000   Anxiety and depression    Arthritis BACK   Bipolar disorder ([place])    Blepharospasm LEFT EYE   CHF exacerbation ([place]) 02/24/2017   Chronic back pain    COPD (chronic obstructive  pulmonary disease) (HCC)    Coronary artery disease CARDIOLOGIST- DR [place]--- LAST VISIT NOTE 09-07-2009  W/ CHART   Emphysema    GERD (gastroesophageal reflux disease)    History of alcohol abuse RECOVERING SINCE 2000   History of Left renal mass 03/02/2012   underwent partial nephrectomy   HTN (hypertension)    Idiopathic acute facial nerve palsy LEFT SIDE--  BOTOX THERAPY   Inferior MI ([place]) 2000--  POST PTCA W/ STENT X1   Osteoporosis    Other and unspecified general anesthetics causing adverse effect in therapeutic use post op delirium--  last anes record w/ chart  from   09-22-2009 (spinal w/ light sedation)   Peripheral vascular disease ([place]) POST RIGHT CAROTID SURG.  1995   Renal cell carcinoma ([place]) 07/09/12   Left mass   Rosacea LEFT FACIAL RASH   S/P radiation therapy 02/21/2012   38.75 Gy HDR 5 Fractions- vaginal cuff   Scoliosis    Seizures (HCC)    x 1 after abrupt discontinuation of  Clonidine   Status post carotid endarterectomy RIGHT --  1995   Status post primary angioplasty with coronary stent 2000--  POST INFERIOR MI   Unstable balance WALKS W/ CANE   Vaginal cancer ([place])      Family History  Problem Relation Age of Onset   Hypertension Mother    Hypertension Father    Cancer Maternal Grandfather        type unknown  Hypertension Sister      Past Surgical History:  Procedure Laterality Date   BREAST EXCISIONAL BIOPSY Left 2019   b9 axilla Bx X 3   CAROTID ENDARTERECTOMY  1995   RIGHT   CATARACT EXTRACTION W/ INTRAOCULAR LENS  IMPLANT, BILATERAL     CERVICAL CONIZATION W/BX  09-23-2008   CORONARY ANGIOPLASTY WITH STENT PLACEMENT  2000-   INFERIOR MI   X1 STENT TO RCA   EUS N/A 03/05/2012   Procedure: FULL UPPER ENDOSCOPIC ULTRASOUND (EUS) RADIAL and EGD;  Surgeon: Milus Banister, MD;  Location: [facility];  Service: Endoscopy;  Laterality: N/A;  ercp scope first than eus scope   HEMIARTHROPLASTY HIP  12-26-2008   LEFT FEMORAL NECK FX   ORIF HIP  FRACTURE  02-13-2007   RIGHT FEMORAL NECK FX   ORIF RIGHT DISTAL RADIUS AND RIGHT PROXIMAL HUMEROUS NECK FX'S  10-10-2005   RIGHT SHOULDER SURG.  2007   ROBOTIC ASSITED PARTIAL NEPHRECTOMY Left 07/09/2012   Procedure: ROBOTIC ASSITED PARTIAL NEPHRECTOMY;  Surgeon: Dutch Gray, MD;  Location: [facility];  Service: Urology;  Laterality: Left;   TOTAL HIP ARTHROPLASTY  04-15-2008   POST FAILED  RIGHT HIP ORIF FEMORAL FX   TOTAL KNEE ARTHROPLASTY  09-22-2009   RIGHT   UPPER RIGHT VAGINAL REGION  12/28/11   BIOPSY: SQUAMOUS CELL CARCINOMA   VAGINAL HYSTERECTOMY  07/06/2009   Secondary to dysplasia    Social History   Socioeconomic History   Marital status: Divorced    Spouse name: Not on file   Number of children: 0   Years of education: Not on file   Highest education level: Not on file  Occupational History   Occupation: retired  Tobacco Use   Smoking status: Former    Packs/day: 2.00    Years: 50.00    Pack years: 100.00    Types: Cigarettes    Quit date: 01/17/2010    Years since quitting: 10.4   Smokeless tobacco: Never   Tobacco comments:    STATES QUIT SMOKING 01-17-2010  Vaping Use   Vaping Use: Never used  Substance and Sexual Activity   Alcohol use: Not Currently    Comment: RECOVERING ALCOHOLIC--   QUIT IN 2542   Drug use: No   Sexual activity: Never  Other Topics Concern   Not on file  Social History Narrative   Not on file   Social Determinants of Health   Financial Resource Strain: Not on file  Food Insecurity: Not on file  Transportation Needs: Not on file  Physical Activity: Not on file  Stress: Not on file  Social Connections: Not on file  Intimate Partner Violence: Not on file     Allergies  Allergen Reactions   Doxycycline Diarrhea   Gabapentin Other (See Comments)    sleepiness     Outpatient Medications Prior to Visit  Medication Sig Dispense Refill   albuterol (PROVENTIL HFA;VENTOLIN HFA) 108 (90 BASE) MCG/ACT inhaler Inhale 2 puffs into  the lungs every 6 (six) hours as needed for wheezing.     ARIPiprazole (ABILIFY) 20 MG tablet Take 10 mg by mouth daily.      aspirin EC 81 MG tablet Take 81 mg by mouth every morning.      Budeson-Glycopyrrol-Formoterol (BREZTRI AEROSPHERE) 160-9-4.8 MCG/ACT AERO Inhale 2 puffs into the lungs in the morning and at bedtime. 10.7 g 5   CADUET 10-20 MG tablet TAKE 1 TABLET BY MOUTH DAILY. (Patient taking differently: Take 1 tablet by  mouth daily.) 90 tablet 2   Calcium Carb-Cholecalciferol 600-400 MG-UNIT TABS Take 1 tablet by mouth daily.     celecoxib (CELEBREX) 200 MG capsule Take 200 mg by mouth daily as needed for mild pain.      Cholecalciferol (VITAMIN D3) 2000 UNITS TABS Take 2,000 Units by mouth daily.      denosumab (PROLIA) 60 MG/ML SOSY injection Inject 60 mg into the skin every 6 (six) months.     dexlansoprazole (DEXILANT) 60 MG capsule Take 60 mg by mouth daily.     docusate sodium (COLACE) 100 MG capsule Take 100 mg by mouth daily as needed for mild constipation.     ferrous sulfate 325 (65 FE) MG tablet Take 325 mg by mouth daily.     furosemide (LASIX) 20 MG tablet Take 1 tablet (20 mg total) by mouth daily. 30 tablet 3   ipratropium-albuterol (DUONEB) 0.5-2.5 (3) MG/3ML SOLN Take 3 mLs by nebulization in the morning and at bedtime. 360 mL 11   lamoTRIgine (LAMICTAL) 100 MG tablet Take 200 mg by mouth 2 (two) times daily.     montelukast (SINGULAIR) 10 MG tablet TAKE 1 TABLET BY MOUTH EVERYDAY AT BEDTIME (Patient taking differently: Take 10 mg by mouth at bedtime.) 90 tablet 1   Multiple Vitamin (MULTIVITAMIN) capsule Take 1 capsule by mouth daily.      nitroGLYCERIN (NITROSTAT) 0.4 MG SL tablet Place 1 tablet (0.4 mg total) under the tongue every 5 (five) minutes as needed for chest pain. 25 tablet 3   oxyCODONE-acetaminophen (PERCOCET) 10-325 MG per tablet Take 1 tablet by mouth every 4 (four) hours as needed for pain.      potassium chloride (KLOR-CON) 8 MEQ tablet Take 8 mEq by  mouth daily.      sertraline (ZOLOFT) 50 MG tablet Take 50 mg by mouth daily.     tiZANidine (ZANAFLEX) 4 MG tablet Take 4 mg by mouth 4 (four) times daily as needed for muscle spasms.      valsartan (DIOVAN) 320 MG tablet TAKE 1 TABLET BY MOUTH EVERY DAY (Patient taking differently: Take 320 mg by mouth daily.) 90 tablet 2   hydrALAZINE (APRESOLINE) 25 MG tablet Take 1 tablet (25 mg total) by mouth 2 (two) times daily. 270 tablet 3   No facility-administered medications prior to visit.    Review of Systems  Constitutional:  Negative for chills, fever, malaise/fatigue and weight loss.  HENT:  Negative for hearing loss, sore throat and tinnitus.   Eyes:  Negative for blurred vision and double vision.  Respiratory:  Positive for shortness of breath. Negative for cough, hemoptysis, sputum production, wheezing and stridor.   Cardiovascular:  Negative for chest pain, palpitations, orthopnea, leg swelling and PND.  Gastrointestinal:  Negative for abdominal pain, constipation, diarrhea, heartburn, nausea and vomiting.  Genitourinary:  Negative for dysuria, hematuria and urgency.  Musculoskeletal:  Negative for joint pain and myalgias.  Skin:  Negative for itching and rash.  Neurological:  Negative for dizziness, tingling, weakness and headaches.  Endo/Heme/Allergies:  Negative for environmental allergies. Does not bruise/bleed easily.  Psychiatric/Behavioral:  Negative for depression. The patient is not nervous/anxious and does not have insomnia.   All other systems reviewed and are negative.   Objective:  Physical Exam Vitals reviewed.  Constitutional:      General: She is not in acute distress.    Appearance: She is well-developed.  HENT:     Head: Normocephalic and atraumatic.     Mouth/Throat:  Pharynx: No oropharyngeal exudate.  Eyes:     Conjunctiva/sclera: Conjunctivae normal.     Pupils: Pupils are equal, round, and reactive to light.  Neck:     Vascular: No JVD.      Trachea: No tracheal deviation.     Comments: Loss of supraclavicular fat Cardiovascular:     Rate and Rhythm: Normal rate and regular rhythm.     Heart sounds: S1 normal and S2 normal.     Comments: Distant heart tones Pulmonary:     Effort: No tachypnea or accessory muscle usage.     Breath sounds: No stridor. Decreased breath sounds (throughout all lung fields) present. No wheezing, rhonchi or rales.  Abdominal:     General: Bowel sounds are normal. There is no distension.     Palpations: Abdomen is soft.     Tenderness: There is no abdominal tenderness.  Musculoskeletal:        General: Deformity (muscle wasting ) present.  Skin:    General: Skin is warm and dry.     Capillary Refill: Capillary refill takes less than 2 seconds.     Findings: No rash.  Neurological:     Mental Status: She is alert and oriented to person, place, and time.  Psychiatric:        Behavior: Behavior normal.     Vitals:   06/25/20 1200  BP: 122/70  Pulse: 65  Temp: 97.6 F (36.4 C)  TempSrc: Tympanic  SpO2: (!) 88%  Weight: 176 lb 2 oz (79.9 kg)  Height: 5\' 3"  (1.6 m)   (!) 88% on RA BMI Readings from Last 3 Encounters:  06/25/20 31.20 kg/m  06/19/20 31.71 kg/m  06/18/20 31.71 kg/m   Wt Readings from Last 3 Encounters:  06/25/20 176 lb 2 oz (79.9 kg)  06/19/20 179 lb (81.2 kg)  06/18/20 179 lb (81.2 kg)     CBC    Component Value Date/Time   WBC 11.7 (H) 05/31/2020 0340   RBC 4.89 05/31/2020 0340   HGB 16.0 (H) 05/31/2020 0340   HCT 46.9 (H) 05/31/2020 0340   PLT 269 05/31/2020 0340   MCV 95.9 05/31/2020 0340   MCH 32.7 05/31/2020 0340   MCHC 34.1 05/31/2020 0340   RDW 14.2 05/31/2020 0340   LYMPHSABS 1.8 05/31/2020 0340   MONOABS 0.9 05/31/2020 0340   EOSABS 0.1 05/31/2020 0340   BASOSABS 0.0 05/31/2020 0340    Chest Imaging: 10/24/2019: Lung cancer screening CT lung RADS 4 a suspicious with a new 7.8 mm left upper lobe pulmonary nodule. The patient's images have  been independently reviewed by me.    February 2022 CT chest: 7.8 mm left upper lobe nodule completely resolved in comparison.  Evidence of emphysema present. The patient's images have been independently reviewed by me.     Pulmonary Functions Testing Results: PFT Results Latest Ref Rng & Units 07/02/2019  FVC-Pre L 2.74  FVC-Predicted Pre % 100  FVC-Post L 2.71  FVC-Predicted Post % 99  Pre FEV1/FVC % % 68  Post FEV1/FCV % % 69  FEV1-Pre L 1.86  FEV1-Predicted Pre % 91  FEV1-Post L 1.87  DLCO uncorrected ml/min/mmHg 9.87  DLCO UNC% % 53  DLCO corrected ml/min/mmHg 9.87  DLCO COR %Predicted % 53  DLVA Predicted % 55  TLC L 4.52  TLC % Predicted % 92  RV % Predicted % 84    FeNO:   Pathology:   Echocardiogram:   Heart Catheterization:     Assessment &  Plan:     ICD-10-CM   1. Centrilobular emphysema ([place])  J43.2     2. Chronic respiratory failure with hypoxia (HCC)  J96.11     3. Former smoker  Z87.891     4. Chronic diastolic CHF (congestive heart failure) (HCC)  I50.32     5. Abnormal screening CT of chest  R93.89     6. Lung nodule, solitary  R91.1       Discussion:  This is a 75 year old female, severe centrilobular emphysema, mild obstruction on PFTs, solitary pulmonary nodule of the left upper lobe that has resolved on recent CT scan.  She can be enrolled now in our annual lung cancer screening program to start in February 2023.  This is already been ordered.  Plan: She can continue her current triple therapy inhaler regimen Refill prescriptions for Breztri as needed. Encourage patient to use her spacer.  Patient was instructed on appropriate inhaler use today. Continue orders for DME supply for oxygen Goal SPO2 greater than 88%. Continue albuterol solution and rescue inhaler as needed. Currently using her albuterol nebulizer twice daily.    Current Outpatient Medications:    albuterol (PROVENTIL HFA;VENTOLIN HFA) 108 (90 BASE) MCG/ACT inhaler,  Inhale 2 puffs into the lungs every 6 (six) hours as needed for wheezing., Disp: , Rfl:    ARIPiprazole (ABILIFY) 20 MG tablet, Take 10 mg by mouth daily. , Disp: , Rfl:    aspirin EC 81 MG tablet, Take 81 mg by mouth every morning. , Disp: , Rfl:    Budeson-Glycopyrrol-Formoterol (BREZTRI AEROSPHERE) 160-9-4.8 MCG/ACT AERO, Inhale 2 puffs into the lungs in the morning and at bedtime., Disp: 10.7 g, Rfl: 5   CADUET 10-20 MG tablet, TAKE 1 TABLET BY MOUTH DAILY. (Patient taking differently: Take 1 tablet by mouth daily.), Disp: 90 tablet, Rfl: 2   Calcium Carb-Cholecalciferol 600-400 MG-UNIT TABS, Take 1 tablet by mouth daily., Disp: , Rfl:    celecoxib (CELEBREX) 200 MG capsule, Take 200 mg by mouth daily as needed for mild pain. , Disp: , Rfl:    Cholecalciferol (VITAMIN D3) 2000 UNITS TABS, Take 2,000 Units by mouth daily. , Disp: , Rfl:    denosumab (PROLIA) 60 MG/ML SOSY injection, Inject 60 mg into the skin every 6 (six) months., Disp: , Rfl:    dexlansoprazole (DEXILANT) 60 MG capsule, Take 60 mg by mouth daily., Disp: , Rfl:    docusate sodium (COLACE) 100 MG capsule, Take 100 mg by mouth daily as needed for mild constipation., Disp: , Rfl:    ferrous sulfate 325 (65 FE) MG tablet, Take 325 mg by mouth daily., Disp: , Rfl:    furosemide (LASIX) 20 MG tablet, Take 1 tablet (20 mg total) by mouth daily., Disp: 30 tablet, Rfl: 3   ipratropium-albuterol (DUONEB) 0.5-2.5 (3) MG/3ML SOLN, Take 3 mLs by nebulization in the morning and at bedtime., Disp: 360 mL, Rfl: 11   lamoTRIgine (LAMICTAL) 100 MG tablet, Take 200 mg by mouth 2 (two) times daily., Disp: , Rfl:    montelukast (SINGULAIR) 10 MG tablet, TAKE 1 TABLET BY MOUTH EVERYDAY AT BEDTIME (Patient taking differently: Take 10 mg by mouth at bedtime.), Disp: 90 tablet, Rfl: 1   Multiple Vitamin (MULTIVITAMIN) capsule, Take 1 capsule by mouth daily. , Disp: , Rfl:    nitroGLYCERIN (NITROSTAT) 0.4 MG SL tablet, Place 1 tablet (0.4 mg total) under  the tongue every 5 (five) minutes as needed for chest pain., Disp: 25 tablet, Rfl: 3  oxyCODONE-acetaminophen (PERCOCET) 10-325 MG per tablet, Take 1 tablet by mouth every 4 (four) hours as needed for pain. , Disp: , Rfl:    potassium chloride (KLOR-CON) 8 MEQ tablet, Take 8 mEq by mouth daily. , Disp: , Rfl:    sertraline (ZOLOFT) 50 MG tablet, Take 50 mg by mouth daily., Disp: , Rfl:    tiZANidine (ZANAFLEX) 4 MG tablet, Take 4 mg by mouth 4 (four) times daily as needed for muscle spasms. , Disp: , Rfl:    valsartan (DIOVAN) 320 MG tablet, TAKE 1 TABLET BY MOUTH EVERY DAY (Patient taking differently: Take 320 mg by mouth daily.), Disp: 90 tablet, Rfl: 2   hydrALAZINE (APRESOLINE) 25 MG tablet, Take 1 tablet (25 mg total) by mouth 2 (two) times daily., Disp: 270 tablet, Rfl: 3     Garner Nash, DO [facility] 06/25/2020 12:13 PM

## 2020-07-07 ENCOUNTER — Telehealth: Payer: Self-pay | Admitting: *Deleted

## 2020-07-07 NOTE — Telephone Encounter (Signed)
Patient scheduled a follow up appt for July with Dr Delsa Sale

## 2020-07-10 DIAGNOSIS — M4856XA Collapsed vertebra, not elsewhere classified, lumbar region, initial encounter for fracture: Secondary | ICD-10-CM | POA: Diagnosis not present

## 2020-07-10 DIAGNOSIS — M5416 Radiculopathy, lumbar region: Secondary | ICD-10-CM | POA: Diagnosis not present

## 2020-07-10 DIAGNOSIS — M546 Pain in thoracic spine: Secondary | ICD-10-CM | POA: Diagnosis not present

## 2020-07-14 ENCOUNTER — Other Ambulatory Visit: Payer: Self-pay | Admitting: Chiropractic Medicine

## 2020-07-14 DIAGNOSIS — M4856XA Collapsed vertebra, not elsewhere classified, lumbar region, initial encounter for fracture: Secondary | ICD-10-CM

## 2020-07-15 ENCOUNTER — Ambulatory Visit
Admission: RE | Admit: 2020-07-15 | Discharge: 2020-07-15 | Disposition: A | Discharge status: Home / Self Care | Payer: Medicare Other | Source: Ambulatory Visit | Attending: Chiropractic Medicine | Admitting: Chiropractic Medicine

## 2020-07-15 DIAGNOSIS — M4856XA Collapsed vertebra, not elsewhere classified, lumbar region, initial encounter for fracture: Secondary | ICD-10-CM

## 2020-07-15 DIAGNOSIS — M4854XA Collapsed vertebra, not elsewhere classified, thoracic region, initial encounter for fracture: Secondary | ICD-10-CM | POA: Diagnosis not present

## 2020-07-15 DIAGNOSIS — M4184 Other forms of scoliosis, thoracic region: Secondary | ICD-10-CM | POA: Diagnosis not present

## 2020-07-15 DIAGNOSIS — M545 Low back pain, unspecified: Secondary | ICD-10-CM | POA: Diagnosis not present

## 2020-07-21 ENCOUNTER — Encounter: Payer: Self-pay | Admitting: Obstetrics & Gynecology

## 2020-07-21 NOTE — Progress Notes (Signed)
Follow Up Note: Gyn-Onc  [name] 75 y.o. female  CC: She presents for a f/u visit.  HPI:  Oncology History  Vaginal cancer ([place])  12/28/2011 Initial Diagnosis   Vaginal cancer, Stage I     Radiation Therapy   HDR x 5 with Dr. Lanell Persons      Interval History: She denies any vaginal bleeding, abdominal/pelvic pain, leg pain, urinary symptoms, cough or weight loss,  A Pap smear in 07/2019 showed NILM.  Review of Systems  Review of Systems  Constitutional:  Negative for weight loss.  Respiratory:  Negative for hemoptysis.   Gastrointestinal:  Negative for abdominal pain.  Genitourinary:  Negative for dysuria, frequency and urgency.  Psychiatric/Behavioral: Negative.     Current Meds:  Outpatient Encounter Medications as of 07/22/2020  Medication Sig   albuterol (PROVENTIL HFA;VENTOLIN HFA) 108 (90 BASE) MCG/ACT inhaler Inhale 2 puffs into the lungs every 6 (six) hours as needed for wheezing.   ARIPiprazole (ABILIFY) 20 MG tablet Take 10 mg by mouth daily.    aspirin EC 81 MG tablet Take 81 mg by mouth every morning.    Budeson-Glycopyrrol-Formoterol (BREZTRI AEROSPHERE) 160-9-4.8 MCG/ACT AERO Inhale 2 puffs into the lungs in the morning and at bedtime.   CADUET 10-20 MG tablet TAKE 1 TABLET BY MOUTH DAILY. (Patient taking differently: Take 1 tablet by mouth daily.)   Calcium Carb-Cholecalciferol 600-400 MG-UNIT TABS Take 1 tablet by mouth daily.   celecoxib (CELEBREX) 200 MG capsule Take 200 mg by mouth daily as needed for mild pain.    Cholecalciferol (VITAMIN D3) 2000 UNITS TABS Take 2,000 Units by mouth daily.    denosumab (PROLIA) 60 MG/ML SOSY injection Inject 60 mg into the skin every 6 (six) months.   dexlansoprazole (DEXILANT) 60 MG capsule Take 60 mg by mouth daily.   docusate sodium (COLACE) 100 MG capsule Take 100 mg by mouth daily as needed for mild constipation.   ferrous sulfate  325 (65 FE) MG tablet Take 325 mg by mouth daily.   furosemide (LASIX) 20 MG tablet Take 1 tablet (20 mg total) by mouth daily.   ipratropium-albuterol (DUONEB) 0.5-2.5 (3) MG/3ML SOLN Take 3 mLs by nebulization in the morning and at bedtime.   lamoTRIgine (LAMICTAL) 100 MG tablet Take 200 mg by mouth 2 (two) times daily.   montelukast (SINGULAIR) 10 MG tablet TAKE 1 TABLET BY MOUTH EVERYDAY AT BEDTIME (Patient taking differently: Take 10 mg by mouth at bedtime.)   Multiple Vitamin (MULTIVITAMIN) capsule Take 1 capsule by mouth daily.    oxyCODONE-acetaminophen (PERCOCET) 10-325 MG per tablet Take 1 tablet by mouth every 4 (four) hours as needed for pain.    potassium chloride (KLOR-CON) 8 MEQ tablet Take 8 mEq by mouth daily.    sertraline (ZOLOFT) 50 MG tablet Take 50 mg by mouth daily.   tiZANidine (ZANAFLEX) 4 MG tablet Take 4 mg by mouth 4 (four) times daily as needed for muscle spasms.    valsartan (DIOVAN) 320 MG tablet TAKE 1 TABLET BY MOUTH EVERY DAY (Patient taking differently: Take 320 mg by mouth daily.)   hydrALAZINE (APRESOLINE) 25 MG tablet Take 1 tablet (25 mg total) by mouth 2 (two) times daily.   nitroGLYCERIN (NITROSTAT) 0.4 MG SL tablet Place 1 tablet (0.4 mg total) under the tongue every 5 (five) minutes as needed for chest pain. (Patient not taking: Reported on 07/21/2020)   No facility-administered encounter medications on file as of 07/22/2020.    Allergy:  Allergies  Allergen Reactions   Doxycycline Diarrhea   Gabapentin Other (See Comments)    sleepiness    Social Hx:   Social History   Socioeconomic History   Marital status: Divorced    Spouse name: Not on file   Number of children: 0   Years of education: Not on file   Highest education level: Not on file  Occupational History   Occupation: retired  Tobacco Use   Smoking status: Former    Packs/day: 2.00    Years: 50.00    Pack years: 100.00    Types: Cigarettes    Quit date: 01/17/2010    Years since  quitting: 10.5   Smokeless tobacco: Never   Tobacco comments:    STATES QUIT SMOKING 01-17-2010  Vaping Use   Vaping Use: Never used  Substance and Sexual Activity   Alcohol use: Not Currently    Comment: RECOVERING ALCOHOLIC--   QUIT IN 6160   Drug use: No   Sexual activity: Never  Other Topics Concern   Not on file  Social History Narrative   Not on file   Social Determinants of Health   Financial Resource Strain: Not on file  Food Insecurity: Not on file  Transportation Needs: Not on file  Physical Activity: Not on file  Stress: Not on file  Social Connections: Not on file  Intimate Partner Violence: Not on file    Past Surgical Hx:  Past Surgical History:  Procedure Laterality Date   BREAST EXCISIONAL BIOPSY Left 2019   b9 axilla Bx X 3   CAROTID ENDARTERECTOMY  1995   RIGHT   CATARACT EXTRACTION W/ INTRAOCULAR LENS  IMPLANT, BILATERAL     CERVICAL CONIZATION W/BX  09-23-2008   CORONARY ANGIOPLASTY WITH STENT PLACEMENT  2000-   INFERIOR MI   X1 STENT TO RCA   EUS N/A 03/05/2012   Procedure: FULL UPPER ENDOSCOPIC ULTRASOUND (EUS) RADIAL and EGD;  Surgeon: Milus Banister, MD;  Location: [facility];  Service: Endoscopy;  Laterality: N/A;  ercp scope first than eus scope   HEMIARTHROPLASTY HIP  12-26-2008   LEFT FEMORAL NECK FX   ORIF HIP FRACTURE  02-13-2007   RIGHT FEMORAL NECK FX   ORIF RIGHT DISTAL RADIUS AND RIGHT PROXIMAL HUMEROUS NECK FX'S  10-10-2005   RIGHT SHOULDER SURG.  2007   ROBOTIC ASSITED PARTIAL NEPHRECTOMY Left 07/09/2012   Procedure: ROBOTIC ASSITED PARTIAL NEPHRECTOMY;  Surgeon: Dutch Gray, MD;  Location: [facility];  Service: Urology;  Laterality: Left;   TOTAL HIP ARTHROPLASTY  04-15-2008   POST FAILED  RIGHT HIP ORIF FEMORAL FX   TOTAL KNEE ARTHROPLASTY  09-22-2009   RIGHT   UPPER RIGHT VAGINAL REGION  12/28/11   BIOPSY: SQUAMOUS CELL CARCINOMA   VAGINAL HYSTERECTOMY  07/06/2009   Secondary to dysplasia    Past Medical Hx:  Past Medical  History:  Diagnosis Date   Alcoholism ([place])    recovering since 2000   Anxiety and depression    Arthritis BACK   Bipolar disorder ([place])    Blepharospasm LEFT EYE   CHF exacerbation ([place]) 02/24/2017   Chronic back pain    COPD (chronic obstructive pulmonary disease) ([facility])    Coronary artery disease CARDIOLOGIST- DR [place]--- LAST VISIT NOTE 09-07-2009  W/ CHART   Emphysema    GERD (gastroesophageal reflux disease)    History of alcohol abuse RECOVERING SINCE 2000   History of Left renal mass 03/02/2012   underwent partial nephrectomy   HTN (hypertension)  Idiopathic acute facial nerve palsy LEFT SIDE--  BOTOX THERAPY   Inferior MI ([place]) 2000--  POST PTCA W/ STENT X1   Osteoporosis    Other and unspecified general anesthetics causing adverse effect in therapeutic use post op delirium--  last anes record w/ chart  from   09-22-2009 (spinal w/ light sedation)   Peripheral vascular disease ([place]) POST RIGHT CAROTID SURG.  1995   Renal cell carcinoma ([place]) 07/09/12   Left mass   Rosacea LEFT FACIAL RASH   S/P radiation therapy 02/21/2012   38.75 Gy HDR 5 Fractions- vaginal cuff   Scoliosis    Seizures ([facility])    x 1 after abrupt discontinuation of  Clonidine   Status post carotid endarterectomy RIGHT --  1995   Status post primary angioplasty with coronary stent 2000--  POST INFERIOR MI   Unstable balance WALKS W/ CANE   Vaginal cancer ([place])     Family Hx:  Family History  Problem Relation Age of Onset   Hypertension Mother    Hypertension Father    Cancer Maternal Grandfather        type unknown   Hypertension Sister     Vitals:  BP 134/65 (BP Location: Left Arm, Patient Position: Sitting)   Pulse 74   Temp 98.6 F (37 C) (Oral)   Resp 16   Ht 5\' 3"  (1.6 m)   Wt 172 lb (78 kg)   SpO2 (!) 85% Comment: MD notified. pt. asymptomatic  Pt. states she has oxygen at home but does not travel with it, O2 stays around 89 at home.  BMI 30.47 kg/m   Physical Exam Exam conducted  with a chaperone present.  Abdominal:     Palpations: Abdomen is soft. There is no mass.     Tenderness: There is no abdominal tenderness.  Genitourinary:    Exam position: Lithotomy position.     Uterus: Absent.      Rectum: Normal.     Comments: Atrophic changes.  Cervix absent.  Short vaginal canal. Lymphadenopathy:     Cervical:     Right cervical: No superficial cervical adenopathy.    Left cervical: No superficial cervical adenopathy.     Lower Body: No right inguinal adenopathy. No left inguinal adenopathy.  Skin:    General: Skin is warm and dry.  Neurological:     General: No focal deficit present.     Mental Status: She is alert.  Psychiatric:        Mood and Affect: Mood normal.    Assessment/Plan: Vaginal cancer H/O a clinical stage 1 vaginal carcinoma treated w/RT Negative symptom review, unremarkable exam  >review the Pap test result >recommend annual f/u  I personally spent 25 minutes face-to-face and non-face-to-face in the care of this patient, which includes all pre, intra, and post visit time on the date of service.   Lahoma Crocker, MD 07/22/2020, 12:48 PM

## 2020-07-21 NOTE — Assessment & Plan Note (Addendum)
H/O a clinical stage 1 vaginal carcinoma treated w/RT Negative symptom review, unremarkable exam  >review the Pap test result >recommend annual f/u

## 2020-07-22 ENCOUNTER — Other Ambulatory Visit (HOSPITAL_COMMUNITY)
Admission: RE | Admit: 2020-07-22 | Discharge: 2020-07-22 | Disposition: A | Discharge status: Home / Self Care | Payer: Medicare Other | Source: Ambulatory Visit | Attending: Obstetrics & Gynecology | Admitting: Obstetrics & Gynecology

## 2020-07-22 ENCOUNTER — Other Ambulatory Visit: Payer: Self-pay

## 2020-07-22 ENCOUNTER — Encounter: Payer: Self-pay | Admitting: Obstetrics & Gynecology

## 2020-07-22 ENCOUNTER — Other Ambulatory Visit: Payer: Self-pay | Admitting: Pulmonary Disease

## 2020-07-22 ENCOUNTER — Inpatient Hospital Stay: Payer: Medicare Other | Attending: Obstetrics & Gynecology | Admitting: Obstetrics & Gynecology

## 2020-07-22 VITALS — BP 134/65 | HR 74 | Temp 98.6°F | Resp 16 | Ht 63.0 in | Wt 172.0 lb

## 2020-07-22 DIAGNOSIS — Z79899 Other long term (current) drug therapy: Secondary | ICD-10-CM | POA: Diagnosis not present

## 2020-07-22 DIAGNOSIS — Z01419 Encounter for gynecological examination (general) (routine) without abnormal findings: Secondary | ICD-10-CM | POA: Insufficient documentation

## 2020-07-22 DIAGNOSIS — I739 Peripheral vascular disease, unspecified: Secondary | ICD-10-CM | POA: Insufficient documentation

## 2020-07-22 DIAGNOSIS — Z923 Personal history of irradiation: Secondary | ICD-10-CM | POA: Insufficient documentation

## 2020-07-22 DIAGNOSIS — Z85528 Personal history of other malignant neoplasm of kidney: Secondary | ICD-10-CM | POA: Diagnosis not present

## 2020-07-22 DIAGNOSIS — I110 Hypertensive heart disease with heart failure: Secondary | ICD-10-CM | POA: Insufficient documentation

## 2020-07-22 DIAGNOSIS — Z955 Presence of coronary angioplasty implant and graft: Secondary | ICD-10-CM | POA: Insufficient documentation

## 2020-07-22 DIAGNOSIS — Z7982 Long term (current) use of aspirin: Secondary | ICD-10-CM | POA: Diagnosis not present

## 2020-07-22 DIAGNOSIS — Z8544 Personal history of malignant neoplasm of other female genital organs: Secondary | ICD-10-CM | POA: Diagnosis not present

## 2020-07-22 DIAGNOSIS — F419 Anxiety disorder, unspecified: Secondary | ICD-10-CM | POA: Insufficient documentation

## 2020-07-22 DIAGNOSIS — C52 Malignant neoplasm of vagina: Secondary | ICD-10-CM | POA: Insufficient documentation

## 2020-07-22 DIAGNOSIS — I2510 Atherosclerotic heart disease of native coronary artery without angina pectoris: Secondary | ICD-10-CM | POA: Insufficient documentation

## 2020-07-22 DIAGNOSIS — K219 Gastro-esophageal reflux disease without esophagitis: Secondary | ICD-10-CM | POA: Insufficient documentation

## 2020-07-22 DIAGNOSIS — I252 Old myocardial infarction: Secondary | ICD-10-CM | POA: Diagnosis not present

## 2020-07-22 MED ORDER — BREZTRI AEROSPHERE 160-9-4.8 MCG/ACT IN AERO: 2.0000 | INHALATION_SPRAY | Freq: Two times a day (BID) | RESPIRATORY_TRACT | 5 refills | Status: DC

## 2020-07-22 NOTE — Patient Instructions (Addendum)
Return in 1 year  Please call our office in May to schedule your appointment for July 2023.

## 2020-07-24 ENCOUNTER — Other Ambulatory Visit (HOSPITAL_COMMUNITY): Payer: Self-pay | Admitting: Chiropractic Medicine

## 2020-07-24 DIAGNOSIS — M4856XA Collapsed vertebra, not elsewhere classified, lumbar region, initial encounter for fracture: Secondary | ICD-10-CM

## 2020-07-24 DIAGNOSIS — H73893 Other specified disorders of tympanic membrane, bilateral: Secondary | ICD-10-CM | POA: Diagnosis not present

## 2020-07-24 DIAGNOSIS — H6121 Impacted cerumen, right ear: Secondary | ICD-10-CM | POA: Diagnosis not present

## 2020-07-24 DIAGNOSIS — H938X2 Other specified disorders of left ear: Secondary | ICD-10-CM | POA: Diagnosis not present

## 2020-07-28 ENCOUNTER — Telehealth: Payer: Self-pay

## 2020-07-28 LAB — CYTOLOGY - PAP
Diagnosis: NEGATIVE
Diagnosis: REACTIVE

## 2020-07-28 NOTE — Telephone Encounter (Signed)
Told Ms Cadena that the pap smear was normal. No pre-cancer or cancer seen.  Showing age related changes per Melissa Cross,NP Pt verbalized understanding,

## 2020-08-04 ENCOUNTER — Ambulatory Visit (HOSPITAL_COMMUNITY)
Admission: RE | Admit: 2020-08-04 | Discharge: 2020-08-04 | Disposition: A | Discharge status: Home / Self Care | Payer: Medicare Other | Source: Ambulatory Visit | Attending: Chiropractic Medicine | Admitting: Chiropractic Medicine

## 2020-08-04 ENCOUNTER — Other Ambulatory Visit (HOSPITAL_COMMUNITY): Payer: Self-pay | Admitting: Chiropractic Medicine

## 2020-08-04 ENCOUNTER — Encounter (HOSPITAL_COMMUNITY): Payer: Self-pay

## 2020-08-04 ENCOUNTER — Encounter (HOSPITAL_COMMUNITY): Payer: Medicare Other

## 2020-08-04 ENCOUNTER — Other Ambulatory Visit: Payer: Self-pay

## 2020-08-04 ENCOUNTER — Encounter (HOSPITAL_COMMUNITY): Admission: RE | Admit: 2020-08-04 | Payer: Medicare Other | Source: Ambulatory Visit

## 2020-08-04 DIAGNOSIS — M19071 Primary osteoarthritis, right ankle and foot: Secondary | ICD-10-CM | POA: Diagnosis not present

## 2020-08-04 DIAGNOSIS — M47816 Spondylosis without myelopathy or radiculopathy, lumbar region: Secondary | ICD-10-CM | POA: Diagnosis not present

## 2020-08-04 DIAGNOSIS — M4856XA Collapsed vertebra, not elsewhere classified, lumbar region, initial encounter for fracture: Secondary | ICD-10-CM

## 2020-08-04 DIAGNOSIS — M19072 Primary osteoarthritis, left ankle and foot: Secondary | ICD-10-CM | POA: Diagnosis not present

## 2020-08-04 DIAGNOSIS — S32000A Wedge compression fracture of unspecified lumbar vertebra, initial encounter for closed fracture: Secondary | ICD-10-CM

## 2020-08-04 DIAGNOSIS — M19012 Primary osteoarthritis, left shoulder: Secondary | ICD-10-CM | POA: Diagnosis not present

## 2020-08-04 MED ORDER — TECHNETIUM TC 99M MEDRONATE IV KIT
22.0000 | PACK | Freq: Once | INTRAVENOUS | Status: AC
  Administered 2020-08-04: 22 via INTRAVENOUS

## 2020-08-06 DIAGNOSIS — M4854XA Collapsed vertebra, not elsewhere classified, thoracic region, initial encounter for fracture: Secondary | ICD-10-CM | POA: Diagnosis not present

## 2020-08-12 DIAGNOSIS — H0100A Unspecified blepharitis right eye, upper and lower eyelids: Secondary | ICD-10-CM | POA: Diagnosis not present

## 2020-08-12 DIAGNOSIS — H04123 Dry eye syndrome of bilateral lacrimal glands: Secondary | ICD-10-CM | POA: Diagnosis not present

## 2020-08-12 DIAGNOSIS — H16212 Exposure keratoconjunctivitis, left eye: Secondary | ICD-10-CM | POA: Diagnosis not present

## 2020-08-12 DIAGNOSIS — G245 Blepharospasm: Secondary | ICD-10-CM | POA: Diagnosis not present

## 2020-08-13 ENCOUNTER — Other Ambulatory Visit: Payer: Self-pay | Admitting: Chiropractic Medicine

## 2020-08-13 DIAGNOSIS — M4854XA Collapsed vertebra, not elsewhere classified, thoracic region, initial encounter for fracture: Secondary | ICD-10-CM

## 2020-08-13 DIAGNOSIS — M4856XA Collapsed vertebra, not elsewhere classified, lumbar region, initial encounter for fracture: Secondary | ICD-10-CM

## 2020-08-16 ENCOUNTER — Other Ambulatory Visit: Payer: Self-pay | Admitting: Cardiology

## 2020-08-18 DIAGNOSIS — M5416 Radiculopathy, lumbar region: Secondary | ICD-10-CM | POA: Diagnosis not present

## 2020-08-24 ENCOUNTER — Telehealth (HOSPITAL_COMMUNITY): Payer: Self-pay

## 2020-08-24 DIAGNOSIS — M549 Dorsalgia, unspecified: Secondary | ICD-10-CM | POA: Diagnosis not present

## 2020-08-24 DIAGNOSIS — M62838 Other muscle spasm: Secondary | ICD-10-CM | POA: Diagnosis not present

## 2020-08-24 DIAGNOSIS — Z9181 History of falling: Secondary | ICD-10-CM | POA: Diagnosis not present

## 2020-08-24 NOTE — Telephone Encounter (Signed)
Ok for T11 kp/vp per [employer]. AW

## 2020-08-25 ENCOUNTER — Emergency Department (HOSPITAL_COMMUNITY): Payer: Medicare Other

## 2020-08-25 ENCOUNTER — Encounter (HOSPITAL_COMMUNITY): Payer: Self-pay | Admitting: Emergency Medicine

## 2020-08-25 ENCOUNTER — Emergency Department (HOSPITAL_COMMUNITY)
Admission: EM | Admit: 2020-08-25 | Discharge: 2020-08-26 | Disposition: A | Discharge status: Home / Self Care | Payer: Medicare Other | Source: Home / Self Care | Attending: Emergency Medicine | Admitting: Emergency Medicine

## 2020-08-25 DIAGNOSIS — M4712 Other spondylosis with myelopathy, cervical region: Secondary | ICD-10-CM | POA: Insufficient documentation

## 2020-08-25 DIAGNOSIS — R531 Weakness: Secondary | ICD-10-CM | POA: Diagnosis not present

## 2020-08-25 DIAGNOSIS — I110 Hypertensive heart disease with heart failure: Secondary | ICD-10-CM | POA: Insufficient documentation

## 2020-08-25 DIAGNOSIS — R202 Paresthesia of skin: Secondary | ICD-10-CM | POA: Insufficient documentation

## 2020-08-25 DIAGNOSIS — M5021 Other cervical disc displacement,  high cervical region: Secondary | ICD-10-CM | POA: Diagnosis not present

## 2020-08-25 DIAGNOSIS — I5043 Acute on chronic combined systolic (congestive) and diastolic (congestive) heart failure: Secondary | ICD-10-CM | POA: Insufficient documentation

## 2020-08-25 DIAGNOSIS — Z7982 Long term (current) use of aspirin: Secondary | ICD-10-CM | POA: Insufficient documentation

## 2020-08-25 DIAGNOSIS — G8911 Acute pain due to trauma: Secondary | ICD-10-CM | POA: Diagnosis not present

## 2020-08-25 DIAGNOSIS — I2510 Atherosclerotic heart disease of native coronary artery without angina pectoris: Secondary | ICD-10-CM | POA: Insufficient documentation

## 2020-08-25 DIAGNOSIS — M4312 Spondylolisthesis, cervical region: Secondary | ICD-10-CM | POA: Diagnosis not present

## 2020-08-25 DIAGNOSIS — S0990XA Unspecified injury of head, initial encounter: Secondary | ICD-10-CM | POA: Diagnosis not present

## 2020-08-25 DIAGNOSIS — G319 Degenerative disease of nervous system, unspecified: Secondary | ICD-10-CM | POA: Diagnosis not present

## 2020-08-25 DIAGNOSIS — I6529 Occlusion and stenosis of unspecified carotid artery: Secondary | ICD-10-CM | POA: Diagnosis not present

## 2020-08-25 DIAGNOSIS — Z96651 Presence of right artificial knee joint: Secondary | ICD-10-CM | POA: Insufficient documentation

## 2020-08-25 DIAGNOSIS — S199XXA Unspecified injury of neck, initial encounter: Secondary | ICD-10-CM | POA: Diagnosis not present

## 2020-08-25 DIAGNOSIS — I517 Cardiomegaly: Secondary | ICD-10-CM | POA: Diagnosis not present

## 2020-08-25 DIAGNOSIS — R609 Edema, unspecified: Secondary | ICD-10-CM | POA: Diagnosis not present

## 2020-08-25 DIAGNOSIS — Z8544 Personal history of malignant neoplasm of other female genital organs: Secondary | ICD-10-CM | POA: Insufficient documentation

## 2020-08-25 DIAGNOSIS — I10 Essential (primary) hypertension: Secondary | ICD-10-CM | POA: Diagnosis not present

## 2020-08-25 DIAGNOSIS — M4854XA Collapsed vertebra, not elsewhere classified, thoracic region, initial encounter for fracture: Secondary | ICD-10-CM | POA: Diagnosis not present

## 2020-08-25 DIAGNOSIS — M47812 Spondylosis without myelopathy or radiculopathy, cervical region: Secondary | ICD-10-CM | POA: Diagnosis not present

## 2020-08-25 DIAGNOSIS — M5030 Other cervical disc degeneration, unspecified cervical region: Secondary | ICD-10-CM | POA: Diagnosis not present

## 2020-08-25 DIAGNOSIS — S22009A Unspecified fracture of unspecified thoracic vertebra, initial encounter for closed fracture: Secondary | ICD-10-CM | POA: Diagnosis not present

## 2020-08-25 DIAGNOSIS — Z79899 Other long term (current) drug therapy: Secondary | ICD-10-CM | POA: Insufficient documentation

## 2020-08-25 DIAGNOSIS — M546 Pain in thoracic spine: Secondary | ICD-10-CM | POA: Diagnosis not present

## 2020-08-25 DIAGNOSIS — R0902 Hypoxemia: Secondary | ICD-10-CM | POA: Diagnosis not present

## 2020-08-25 DIAGNOSIS — M4802 Spinal stenosis, cervical region: Secondary | ICD-10-CM | POA: Diagnosis not present

## 2020-08-25 DIAGNOSIS — J439 Emphysema, unspecified: Secondary | ICD-10-CM | POA: Diagnosis not present

## 2020-08-25 DIAGNOSIS — Z85528 Personal history of other malignant neoplasm of kidney: Secondary | ICD-10-CM | POA: Insufficient documentation

## 2020-08-25 DIAGNOSIS — Z87891 Personal history of nicotine dependence: Secondary | ICD-10-CM | POA: Insufficient documentation

## 2020-08-25 DIAGNOSIS — J449 Chronic obstructive pulmonary disease, unspecified: Secondary | ICD-10-CM | POA: Insufficient documentation

## 2020-08-25 DIAGNOSIS — Z96641 Presence of right artificial hip joint: Secondary | ICD-10-CM | POA: Insufficient documentation

## 2020-08-25 DIAGNOSIS — M542 Cervicalgia: Secondary | ICD-10-CM | POA: Diagnosis not present

## 2020-08-25 LAB — COMPREHENSIVE METABOLIC PANEL
ALT: 17 U/L (ref 0–44)
AST: 18 U/L (ref 15–41)
Albumin: 4.1 g/dL (ref 3.5–5.0)
Alkaline Phosphatase: 71 U/L (ref 38–126)
Anion gap: 9 (ref 5–15)
BUN: 16 mg/dL (ref 8–23)
CO2: 26 mmol/L (ref 22–32)
Calcium: 9.6 mg/dL (ref 8.9–10.3)
Chloride: 102 mmol/L (ref 98–111)
Creatinine, Ser: 0.69 mg/dL (ref 0.44–1.00)
GFR, Estimated: >60 mL/min (ref >60)
Glucose, Bld: 105 mg/dL — ABNORMAL HIGH (ref 70–99)
Potassium: 4.2 mmol/L (ref 3.5–5.1)
Sodium: 137 mmol/L (ref 135–145)
Total Bilirubin: 0.8 mg/dL (ref 0.3–1.2)
Total Protein: 7 g/dL (ref 6.5–8.1)

## 2020-08-25 LAB — CBC WITH DIFFERENTIAL/PLATELET
Abs Immature Granulocytes: 0.02 10*3/uL (ref 0.00–0.07)
Basophils Absolute: 0.1 10*3/uL (ref 0.0–0.1)
Basophils Relative: 1 %
Eosinophils Absolute: 0.2 10*3/uL (ref 0.0–0.5)
Eosinophils Relative: 2 %
HCT: 43.2 % (ref 36.0–46.0)
Hemoglobin: 15.3 g/dL — ABNORMAL HIGH (ref 12.0–15.0)
Immature Granulocytes: 0 %
Lymphocytes Relative: 10 %
Lymphs Abs: 1 10*3/uL (ref 0.7–4.0)
MCH: 34.2 pg — ABNORMAL HIGH (ref 26.0–34.0)
MCHC: 35.4 g/dL (ref 30.0–36.0)
MCV: 96.4 fL (ref 80.0–100.0)
Monocytes Absolute: 0.5 10*3/uL (ref 0.1–1.0)
Monocytes Relative: 5 %
Neutro Abs: 7.8 10*3/uL — ABNORMAL HIGH (ref 1.7–7.7)
Neutrophils Relative %: 82 %
Platelets: 240 10*3/uL (ref 150–400)
RBC: 4.48 MIL/uL (ref 3.87–5.11)
RDW: 14 % (ref 11.5–15.5)
WBC: 9.4 10*3/uL (ref 4.0–10.5)
nRBC: 0 % (ref 0.0–0.2)

## 2020-08-25 LAB — URINALYSIS, ROUTINE W REFLEX MICROSCOPIC
Bacteria, UA: NONE SEEN
Bilirubin Urine: NEGATIVE
Glucose, UA: NEGATIVE mg/dL
Hgb urine dipstick: NEGATIVE
Ketones, ur: NEGATIVE mg/dL
Nitrite: NEGATIVE
Protein, ur: NEGATIVE mg/dL
Specific Gravity, Urine: 1.008 (ref 1.005–1.030)
pH: 6 (ref 5.0–8.0)

## 2020-08-25 LAB — LACTIC ACID, PLASMA: Lactic Acid, Venous: 0.8 mmol/L (ref 0.5–1.9)

## 2020-08-25 LAB — PROTIME-INR
INR: 0.9 (ref 0.8–1.2)
Prothrombin Time: 12.1 seconds (ref 11.4–15.2)

## 2020-08-25 LAB — APTT: aPTT: 33 seconds (ref 24–36)

## 2020-08-25 MED ORDER — LORAZEPAM 2 MG/ML IJ SOLN
0.5000 mg | Freq: Once | INTRAMUSCULAR | Status: AC
  Administered 2020-08-25: 0.5 mg via INTRAVENOUS
  Filled 2020-08-25: qty 1

## 2020-08-25 MED ORDER — FENTANYL CITRATE (PF) 100 MCG/2ML IJ SOLN
100.0000 ug | INTRAMUSCULAR | Status: DC | PRN
  Administered 2020-08-25: 100 ug via INTRAVENOUS
  Filled 2020-08-25: qty 2

## 2020-08-25 NOTE — ED Provider Notes (Signed)
Care assumed from Dr. Eulis Foster, patient with difficulty walking pending MRI of cervical and thoracic spine.  MRI showed significant degenerative changes, progression of compression fracture T11, moderate to severe spinal stenosis at C2-3 and C3-4 without myelopathy.  None of these are requiring acute intervention.  I discussed this with the patient.  She will be referred back to her orthopedic surgeon, also referred to neurosurgery for further evaluation.  Results for orders placed or performed during the hospital encounter of 08/25/20  Blood culture (routine single)   Specimen: BLOOD LEFT FOREARM  Result Value Ref Range   Specimen Description BLOOD LEFT FOREARM    Special Requests      BOTTLES DRAWN AEROBIC AND ANAEROBIC Blood Culture results may not be optimal due to an inadequate volume of blood received in culture bottles Performed at [facility] [address], [place], [place] [address]    Culture PENDING    Report Status PENDING   Lactic acid, plasma  Result Value Ref Range   Lactic Acid, Venous 0.8 0.5 - 1.9 mmol/L  Comprehensive metabolic panel  Result Value Ref Range   Sodium 137 135 - 145 mmol/L   Potassium 4.2 3.5 - 5.1 mmol/L   Chloride 102 98 - 111 mmol/L   CO2 26 22 - 32 mmol/L   Glucose, Bld 105 (H) 70 - 99 mg/dL   BUN 16 8 - 23 mg/dL   Creatinine, Ser 0.69 0.44 - 1.00 mg/dL   Calcium 9.6 8.9 - 10.3 mg/dL   Total Protein 7.0 6.5 - 8.1 g/dL   Albumin 4.1 3.5 - 5.0 g/dL   AST 18 15 - 41 U/L   ALT 17 0 - 44 U/L   Alkaline Phosphatase 71 38 - 126 U/L   Total Bilirubin 0.8 0.3 - 1.2 mg/dL   GFR, Estimated >60 >60 mL/min   Anion gap 9 5 - 15  CBC WITH DIFFERENTIAL  Result Value Ref Range   WBC 9.4 4.0 - 10.5 K/uL   RBC 4.48 3.87 - 5.11 MIL/uL   Hemoglobin 15.3 (H) 12.0 - 15.0 g/dL   HCT 43.2 36.0 - 46.0 %   MCV 96.4 80.0 - 100.0 fL   MCH 34.2 (H) 26.0 - 34.0 pg   MCHC 35.4 30.0 - 36.0 g/dL   RDW 14.0 11.5 - 15.5 %   Platelets 240 150 - 400 K/uL   nRBC 0.0  0.0 - 0.2 %   Neutrophils Relative % 82 %   Neutro Abs 7.8 (H) 1.7 - 7.7 K/uL   Lymphocytes Relative 10 %   Lymphs Abs 1.0 0.7 - 4.0 K/uL   Monocytes Relative 5 %   Monocytes Absolute 0.5 0.1 - 1.0 K/uL   Eosinophils Relative 2 %   Eosinophils Absolute 0.2 0.0 - 0.5 K/uL   Basophils Relative 1 %   Basophils Absolute 0.1 0.0 - 0.1 K/uL   Immature Granulocytes 0 %   Abs Immature Granulocytes 0.02 0.00 - 0.07 K/uL  Protime-INR  Result Value Ref Range   Prothrombin Time 12.1 11.4 - 15.2 seconds   INR 0.9 0.8 - 1.2  APTT  Result Value Ref Range   aPTT 33 24 - 36 seconds  Urinalysis, Routine w reflex microscopic Urine, Clean Catch  Result Value Ref Range   Color, Urine YELLOW YELLOW   APPearance CLEAR CLEAR   Specific Gravity, Urine 1.008 1.005 - 1.030   pH 6.0 5.0 - 8.0   Glucose, UA NEGATIVE NEGATIVE mg/dL   Hgb urine  dipstick NEGATIVE NEGATIVE   Bilirubin Urine NEGATIVE NEGATIVE   Ketones, ur NEGATIVE NEGATIVE mg/dL   Protein, ur NEGATIVE NEGATIVE mg/dL   Nitrite NEGATIVE NEGATIVE   Leukocytes,Ua TRACE (A) NEGATIVE   RBC / HPF 0-5 0 - 5 RBC/hpf   WBC, UA 0-5 0 - 5 WBC/hpf   Bacteria, UA NONE SEEN NONE SEEN   Mucus PRESENT    CT Head Wo Contrast  Result Date: 08/25/2020 CLINICAL DATA:  Head trauma fall EXAM: CT HEAD WITHOUT CONTRAST CT CERVICAL SPINE WITHOUT CONTRAST TECHNIQUE: Multidetector CT imaging of the head and cervical spine was performed following the standard protocol without intravenous contrast. Multiplanar CT image reconstructions of the cervical spine were also generated. COMPARISON:  CT 12/16/2019 FINDINGS: CT HEAD FINDINGS Brain: No acute territorial infarction, hemorrhage or intracranial mass. Atrophy and mild chronic small vessel ischemic changes of the white matter. Nonenlarged ventricles Vascular: No hyperdense vessels. Vertebral and carotid vascular calcification Skull: Normal. Negative for fracture or focal lesion. Sinuses/Orbits: No acute finding. Other: None  CT CERVICAL SPINE FINDINGS Alignment: Reversal of cervical lordosis. 3 mm anterolisthesis C3 on C4. Trace anterolisthesis C4 on C5. Facet alignment within normal limits. Skull base and vertebrae: No acute fracture. No primary bone lesion or focal pathologic process. Soft tissues and spinal canal: No prevertebral fluid or swelling. No visible canal hematoma. Disc levels: Diffuse degenerative changes throughout the cervical spine with multiple level disc space narrowing and osteophyte. Facet degenerative changes at multiple levels with foraminal stenosis. Upper chest: Negative. Other: None IMPRESSION: 1. No CT evidence for acute intracranial abnormality. Atrophy and chronic small vessel ischemic changes of the white matter. 2. Degenerative changes of the cervical spine.  No fracture is seen Electronically Signed   By: Donavan Foil M.D.   On: 08/25/2020 20:08   CT Cervical Spine Wo Contrast  Result Date: 08/25/2020 CLINICAL DATA:  Head trauma fall EXAM: CT HEAD WITHOUT CONTRAST CT CERVICAL SPINE WITHOUT CONTRAST TECHNIQUE: Multidetector CT imaging of the head and cervical spine was performed following the standard protocol without intravenous contrast. Multiplanar CT image reconstructions of the cervical spine were also generated. COMPARISON:  CT 12/16/2019 FINDINGS: CT HEAD FINDINGS Brain: No acute territorial infarction, hemorrhage or intracranial mass. Atrophy and mild chronic small vessel ischemic changes of the white matter. Nonenlarged ventricles Vascular: No hyperdense vessels. Vertebral and carotid vascular calcification Skull: Normal. Negative for fracture or focal lesion. Sinuses/Orbits: No acute finding. Other: None CT CERVICAL SPINE FINDINGS Alignment: Reversal of cervical lordosis. 3 mm anterolisthesis C3 on C4. Trace anterolisthesis C4 on C5. Facet alignment within normal limits. Skull base and vertebrae: No acute fracture. No primary bone lesion or focal pathologic process. Soft tissues and spinal  canal: No prevertebral fluid or swelling. No visible canal hematoma. Disc levels: Diffuse degenerative changes throughout the cervical spine with multiple level disc space narrowing and osteophyte. Facet degenerative changes at multiple levels with foraminal stenosis. Upper chest: Negative. Other: None IMPRESSION: 1. No CT evidence for acute intracranial abnormality. Atrophy and chronic small vessel ischemic changes of the white matter. 2. Degenerative changes of the cervical spine.  No fracture is seen Electronically Signed   By: Donavan Foil M.D.   On: 08/25/2020 20:08   MR Cervical Spine Wo Contrast  Result Date: 08/26/2020 CLINICAL DATA:  Initial evaluation for myelopathy, acute or progressive. EXAM: MRI CERVICAL SPINE WITHOUT CONTRAST TECHNIQUE: Multiplanar, multisequence MR imaging of the cervical spine was performed. No intravenous contrast was administered. COMPARISON:  CT from earlier the  same day. FINDINGS: Alignment: Reversal of the normal cervical lordosis with focal kyphotic angulation at the cervicothoracic junction. Grade 1 anterolisthesis of C3 on C4, C4 on C5, and C5 on C6, chronic and facet mediated. Vertebrae: Vertebral body height maintained without acute or chronic fracture. Bone marrow signal intensity within normal limits. No worrisome osseous lesions. No abnormal marrow edema. Cord: Signal intensity within the cervical spinal cord is within normal limits. Posterior Fossa, vertebral arteries, paraspinal tissues: Remote lacunar infarct present at the pons. Age-related cerebral atrophy noted within the visualized brain and posterior fossa. Craniocervical junction normal. Paraspinous and prevertebral soft tissues demonstrate no acute finding. Normal flow voids preserved within the vertebral arteries bilaterally. Disc levels: C2-C3: Broad-based posterior disc bulge flattens and effaces the ventral thecal sac. Moderate right worse than left facet and ligament flavum hypertrophy. Small joint  effusion on the left. Resultant moderate spinal stenosis. Mild to moderate left worse than right C3 foraminal narrowing. C3-C4: Anterolisthesis. Diffuse disc bulge with bilateral uncovertebral hypertrophy. Advanced bilateral facet degeneration, worse on the right. 13 mm cystic lesion along the left ligamentum flavum could reflect cystic ligamentous degeneration versus a synovial cyst (series 13, image 12). Resultant moderate to severe spinal stenosis with severe bilateral C4 foraminal narrowing. C4-C5: Trace anterolisthesis. Mild disc bulge with uncovertebral hypertrophy. Moderate right worse than left facet degeneration. Flattening of the ventral thecal sac without significant spinal stenosis. Severe left worse than right C5 foraminal stenosis. C5-C6: Trace anterolisthesis. Degenerative intervertebral disc space narrowing with diffuse disc bulge and bilateral uncovertebral spurring, greater on the right. Flattening of the ventral thecal sac without significant spinal stenosis. Severe right with moderate left C6 foraminal narrowing. C6-C7: Degenerative intervertebral disc space narrowing with diffuse disc osteophyte complex. Flattening of the ventral thecal sac without significant spinal stenosis. Mild bilateral C7 foraminal narrowing. C7-T1: Degenerative intervertebral disc space narrowing with diffuse disc bulge. Right greater than left uncovertebral spurring. No spinal stenosis. Mild right C8 foraminal narrowing. Left neural foramina remains patent. IMPRESSION: 1. No acute abnormality within the cervical spine. 2. Moderate to severe spinal stenosis at C2-3 and C3-4 related to disc bulging and facet disease. No cord signal changes to suggest myelopathy. 3. Multifactorial degenerative changes with resultant severe bilateral C4 and C5 foraminal narrowing, with severe right and moderate left C6 foraminal stenosis. Electronically Signed   By: Jeannine Boga M.D.   On: 08/26/2020 00:20   MR THORACIC SPINE WO  CONTRAST  Result Date: 08/26/2020 CLINICAL DATA:  Initial evaluation for myelopathy, acute or progressive. EXAM: MRI THORACIC SPINE WITHOUT CONTRAST TECHNIQUE: Multiplanar, multisequence MR imaging of the thoracic spine was performed. No intravenous contrast was administered. COMPARISON:  CT from 07/15/2020. FINDINGS: Alignment: Examination somewhat technically limited by motion artifact. Additionally, the dens is not visualized on counter sequence. First rib-bearing vertebral body is labeled T1, with same numbering system employed as on prior CT. Straightening of the normal thoracic kyphosis. Trace retrolisthesis of L1 on L2. Vertebrae: Acute appearing compression fracture seen involving the superior endplate of 624THL. Associated mild 15-20% height loss has mildly progressed from prior CT. No significant bony retropulsion. Adjacent chronic compression deformity of T12 with up to approximately 50% height loss and 3 mm bony retropulsion is stable. Otherwise, vertebral body height maintained with no other acute fracture. Underlying bone marrow signal intensity within normal limits. Benign hemangioma noted within the T4 vertebral body. No worrisome osseous lesions. No other abnormal marrow edema. Cord: Signal intensity within the thoracic spinal cord is within normal limits. No  convincing cord signal abnormality seen on this motion degraded exam. Conus terminates near the level of L1. Paraspinal and other soft tissues: Mild edema noted within the subcutaneous soft tissues of the visualized lower back. Paraspinous soft tissues demonstrate no other acute abnormality. Disc levels: Normal expected for age multilevel disc desiccation seen throughout the thoracic spine. No significant disc bulge or focal disc herniation. Moderate posterior element hypertrophy present at T10-11 with resultant mild spinal stenosis. 3 mm bony retropulsion related to the chronic T12 fracture without significant stenosis. No other stenosis or  neural impingement noted within the thoracic spine. Few scattered benign perineural cysts noted about the neural foramina, most pronounced at T10-11 on the right. IMPRESSION: 1. Acute compression fracture involving the superior endplate of 624THL with mild 15-20% height loss but no significant bony retropulsion. Overall, this is slightly progressed as compared to prior CT from 07/15/2020. 2. Chronic compression deformity of T12 with up to 50% height loss and 3 mm bony retropulsion, stable. 3. Moderate posterior element hypertrophy at T10-11 with resultant mild spinal stenosis. 4. No other acute abnormality within the thoracic spine. No other significant stenosis. No cord signal abnormality to suggest myelopathy. Electronically Signed   By: Jeannine Boga M.D.   On: 08/26/2020 00:42   NM Bone Scan Whole Body  Result Date: 08/04/2020 CLINICAL DATA:  History of vaginal cancer, thoracolumbar compression fracture EXAM: NUCLEAR MEDICINE WHOLE BODY BONE SCAN TECHNIQUE: Whole body anterior and posterior images were obtained approximately 3 hours after intravenous injection of radiopharmaceutical. RADIOPHARMACEUTICALS:  22.0 mCi Technetium-35mMDP IV COMPARISON:  07/15/2020 FINDINGS: Anterior and posterior whole body planar images are obtained. Physiologic excretion of radiotracer is seen within the kidneys and bladder. Photopenia is seen within the right knee and bilateral hips consistent with arthroplasties. Mild degenerative type activity is seen within the bilateral shoulders and ankles. There is intense increased radiotracer uptake within the superior endplate of the [id number] body corresponds to the acute fracture seen previously. Low level uptake within the posterior elements in the lumbar spine consistent with facet hypertrophic changes seen on recent CT. IMPRESSION: 1. Significant radiotracer uptake within the superior endplate of [id number] with acute compression fracture seen on recent CT. 2.  Multifocal degenerative changes of the shoulders, ankles, and lumbar spine. 3. Bilateral total hip arthroplasties and right knee arthroplasty as above. No evidence of complication. Electronically Signed   By: MRanda NgoM.D.   On: 08/04/2020 23:43   DG Chest Port 1 View  Result Date: 08/25/2020 CLINICAL DATA:  Questionable sepsis - evaluate for abnormality Fall 1 week ago.  Progressive weakness since the fall. EXAM: PORTABLE CHEST 1 VIEW COMPARISON:  05/28/2020, chest CT 03/02/2020 FINDINGS: Stable cardiomegaly. Unchanged mediastinal contours with aortic atherosclerosis and tortuosity. There is emphysema with chronic bronchial thickening. No acute airspace disease. No pleural effusion or pneumothorax. Surgical hardware in the right proximal humerus. The bones are diffusely under mineralized. No acute osseous abnormalities are seen. Left upper rib fractures are chronic based on prior CT. IMPRESSION: 1. No acute chest findings. 2. Stable cardiomegaly and aortic tortuosity. Aortic Atherosclerosis (ICD10-I70.0). 3. Emphysema with chronic bronchial thickening. Electronically Signed   By: MKeith RakeM.D.   On: [id number]     GDelora Fuel MD [id number]

## 2020-08-25 NOTE — ED Triage Notes (Signed)
Patient here from urgent care reporting fall x1 week. Has gotten weaker since fall, requesting MRI.

## 2020-08-25 NOTE — ED Provider Notes (Signed)
[facility] DEPT Provider Note   CSN: [id number] Arrival date & time: 08/25/20  1754     History Chief Complaint  Patient presents with   [name] is a 75 y.o. female.  HPI She presents for evaluation of increasing difficulty walking over the last 2 days.  Today she went to see her orthopedist, to discuss this.  They plan on scheduling her for MRI imaging, but since her condition was worsening, the patient came here by EMS for evaluation.  It is not clear if the provider there requested that, on the patient.  The patient states that she typically can walk with her walker but for 2 days she has not been able to do that.  She states that when she stands up she has a tingling sensation in both her arms and legs that prevent her from walking.  She denies loss of bowel or bladder function.  She denies pain in arms or legs with walking.  She has chronic pain, for which she takes oxycodone every 4-5 hours.  She denies recent fever, vomiting or dizziness.  There are no other known active modifying factors.    Past Medical History:  Diagnosis Date   Alcoholism ([place])    recovering since 2000   Anxiety and depression    Arthritis BACK   Bipolar disorder ([place])    Blepharospasm LEFT EYE   CHF exacerbation ([place]) 02/24/2017   Chronic back pain    COPD (chronic obstructive pulmonary disease) (HCC)    Coronary artery disease CARDIOLOGIST- DR Martinique--- LAST VISIT NOTE 09-07-2009  W/ CHART   Emphysema    GERD (gastroesophageal reflux disease)    History of alcohol abuse RECOVERING SINCE 2000   History of Left renal mass 03/02/2012   underwent partial nephrectomy   HTN (hypertension)    Idiopathic acute facial nerve palsy LEFT SIDE--  BOTOX THERAPY   Inferior MI ([place]) 2000--  POST PTCA W/ STENT X1   Osteoporosis    Other and unspecified general anesthetics causing adverse effect in therapeutic use post op delirium--  last anes record w/ chart  from    09-22-2009 (spinal w/ light sedation)   Peripheral vascular disease ([place]) POST RIGHT CAROTID SURG.  1995   Renal cell carcinoma ([place]) 07/09/12   Left mass   Rosacea LEFT FACIAL RASH   S/P radiation therapy 02/21/2012   38.75 Gy HDR 5 Fractions- vaginal cuff   Scoliosis    Seizures (HCC)    x 1 after abrupt discontinuation of  Clonidine   Status post carotid endarterectomy RIGHT --  1995   Status post primary angioplasty with coronary stent 2000--  POST INFERIOR MI   Unstable balance WALKS W/ CANE   Vaginal cancer [facility])     Patient Active Problem List   Diagnosis Date Noted   Acute on chronic respiratory failure with hypoxia ([place]) 05/28/2020   COPD with acute exacerbation ([place]) 05/28/2020   Acute on chronic combined systolic and diastolic CHF (congestive heart failure) ([place]) 05/28/2020   GERD (gastroesophageal reflux disease) 07/02/2019   Chronic diastolic CHF (congestive heart failure) ([place]) 03/07/2019   Atherosclerosis of native artery of both lower extremities with intermittent claudication ([place]) 03/30/2017   CHF exacerbation ([place]) 02/24/2017   Chest pain 02/24/2017   Acute congestive heart failure ([place]) 02/24/2017   Preoperative clearance 06/13/2012   Unspecified gastritis and gastroduodenitis without mention of hemorrhage 03/05/2012   Hypoxia 03/04/2012   Former smoker 03/04/2012  COPD (chronic obstructive pulmonary disease) ([place]) 03/04/2012   Abdominal pain, acute, epigastric 03/02/2012   Abnormal abdominal CT scan 03/02/2012   Hypertension    Anxiety and depression    Alcoholism ([place])    Bipolar disorder ([place])    Status post carotid endarterectomy    Arthritis    Idiopathic acute facial nerve palsy    Blepharospasm    Anxiety    Vaginal cancer ([place])    Carotid artery disease ([place]) 06/28/2011   Coronary artery disease    Inferior MI ([facility])    Peripheral vascular disease (HCC)    HTN (hypertension)    Dysplasia of vagina, histologically confirmed 12/14/2010     Past Surgical History:  Procedure Laterality Date   BREAST EXCISIONAL BIOPSY Left 2019   b9 axilla Bx X 3   CAROTID ENDARTERECTOMY  1995   RIGHT   CATARACT EXTRACTION W/ INTRAOCULAR LENS  IMPLANT, BILATERAL     CERVICAL CONIZATION W/BX  09-23-2008   CORONARY ANGIOPLASTY WITH STENT PLACEMENT  2000-   INFERIOR MI   X1 STENT TO RCA   EUS N/A 03/05/2012   Procedure: FULL UPPER ENDOSCOPIC ULTRASOUND (EUS) RADIAL and EGD;  Surgeon: Milus Banister, MD;  Location: [facility] ENDOSCOPY;  Service: Endoscopy;  Laterality: N/A;  ercp scope first than eus scope   HEMIARTHROPLASTY HIP  12-26-2008   LEFT FEMORAL NECK FX   ORIF HIP FRACTURE  02-13-2007   RIGHT FEMORAL NECK FX   ORIF RIGHT DISTAL RADIUS AND RIGHT PROXIMAL HUMEROUS NECK FX'S  10-10-2005   RIGHT SHOULDER SURG.  2007   ROBOTIC ASSITED PARTIAL NEPHRECTOMY Left 07/09/2012   Procedure: ROBOTIC ASSITED PARTIAL NEPHRECTOMY;  Surgeon: Dutch Gray, MD;  Location: [facility] ORS;  Service: Urology;  Laterality: Left;   TOTAL HIP ARTHROPLASTY  04-15-2008   POST FAILED  RIGHT HIP ORIF FEMORAL FX   TOTAL KNEE ARTHROPLASTY  09-22-2009   RIGHT   UPPER RIGHT VAGINAL REGION  12/28/11   BIOPSY: SQUAMOUS CELL CARCINOMA   VAGINAL HYSTERECTOMY  07/06/2009   Secondary to dysplasia     OB History   No obstetric history on file.     Family History  Problem Relation Age of Onset   Hypertension Mother    Hypertension Father    Cancer Maternal Grandfather        type unknown   Hypertension Sister     Social History   Tobacco Use   Smoking status: Former    Packs/day: 2.00    Years: 50.00    Pack years: 100.00    Types: Cigarettes    Quit date: 01/17/2010    Years since quitting: 10.6   Smokeless tobacco: Never   Tobacco comments:    STATES QUIT SMOKING 01-17-2010  Vaping Use   Vaping Use: Never used  Substance Use Topics   Alcohol use: Not Currently    Comment: RECOVERING ALCOHOLIC--   QUIT IN [id number]   Drug use: No    Home Medications Prior to  Admission medications   Medication Sig Start Date End Date Taking? Authorizing Provider  albuterol (PROVENTIL HFA;VENTOLIN HFA) 108 (90 BASE) MCG/ACT inhaler Inhale 2 puffs into the lungs every 6 (six) hours as needed for wheezing.    [provider]  ARIPiprazole (ABILIFY) 20 MG tablet Take 10 mg by mouth daily.     [provider]  aspirin EC 81 MG tablet Take 81 mg by mouth every morning.     [provider]  Budeson-Glycopyrrol-Formoterol (BREZTRI AEROSPHERE)  160-9-4.8 MCG/ACT AERO Inhale 2 puffs into the lungs in the morning and at bedtime. 07/22/20   Icard, Bradley L, DO  CADUET 10-20 MG tablet TAKE 1 TABLET BY MOUTH DAILY. Patient taking differently: Take 1 tablet by mouth daily. 04/28/20   Martinique, Peter M, MD  Calcium Carb-Cholecalciferol 600-400 MG-UNIT TABS Take 1 tablet by mouth daily.    [provider]  celecoxib (CELEBREX) 200 MG capsule Take 200 mg by mouth daily as needed for mild pain.     [provider]  Cholecalciferol (VITAMIN D3) 2000 UNITS TABS Take 2,000 Units by mouth daily.     [provider]  denosumab (PROLIA) 60 MG/ML SOSY injection Inject 60 mg into the skin every 6 (six) months. 05/23/19   [provider]  dexlansoprazole (DEXILANT) 60 MG capsule Take 60 mg by mouth daily.    [provider]  docusate sodium (COLACE) 100 MG capsule Take 100 mg by mouth daily as needed for mild constipation.    [provider]  ferrous sulfate 325 (65 FE) MG tablet Take 325 mg by mouth daily.    [provider]  furosemide (LASIX) 20 MG tablet Take 1 tablet (20 mg total) by mouth daily. 05/31/20   Dwyane Dee, MD  hydrALAZINE (APRESOLINE) 25 MG tablet TAKE 1 TABLET BY MOUTH TWICE A DAY 08/17/20   Martinique, Peter M, MD  ipratropium-albuterol (DUONEB) 0.5-2.5 (3) MG/3ML SOLN Take 3 mLs by nebulization in the morning and at bedtime. 10/28/19   Julian Hy, DO  lamoTRIgine (LAMICTAL) 100 MG tablet Take  200 mg by mouth 2 (two) times daily.    [provider]  montelukast (SINGULAIR) 10 MG tablet TAKE 1 TABLET BY MOUTH EVERYDAY AT BEDTIME Patient taking differently: Take 10 mg by mouth at bedtime. 04/13/20   Garner Nash, DO  Multiple Vitamin (MULTIVITAMIN) capsule Take 1 capsule by mouth daily.     [provider]  nitroGLYCERIN (NITROSTAT) 0.4 MG SL tablet Place 1 tablet (0.4 mg total) under the tongue every 5 (five) minutes as needed for chest pain. Patient not taking: Reported on 07/21/2020 02/28/19   Abigail Butts., PA-C  oxyCODONE-acetaminophen (PERCOCET) 10-325 MG per tablet Take 1 tablet by mouth every 4 (four) hours as needed for pain.     [provider]  potassium chloride (KLOR-CON) 8 MEQ tablet Take 8 mEq by mouth daily.     [provider]  sertraline (ZOLOFT) 50 MG tablet Take 50 mg by mouth daily.    [provider]  tiZANidine (ZANAFLEX) 4 MG tablet Take 4 mg by mouth 4 (four) times daily as needed for muscle spasms.     [provider]  valsartan (DIOVAN) 320 MG tablet TAKE 1 TABLET BY MOUTH EVERY DAY Patient taking differently: Take 320 mg by mouth daily. 04/24/20   Martinique, Peter M, MD    Allergies    Doxycycline and Gabapentin  Review of Systems   Review of Systems  All other systems reviewed and are negative.  Physical Exam Updated Vital Signs BP (!) 155/95   Pulse (!) 58   Temp 98 F (36.7 C) (Oral)   Resp (!) 21   SpO2 96%   Physical Exam Vitals and nursing note reviewed.  Constitutional:      General: She is not in acute distress.    Appearance: She is well-developed. She is not ill-appearing, toxic-appearing or diaphoretic.  HENT:     Head: Normocephalic and atraumatic.  Right Ear: External ear normal.     Left Ear: External ear normal.     Nose: No congestion.  Eyes:     Conjunctiva/sclera: Conjunctivae normal.     Pupils: Pupils are equal, round, and reactive to light.  Neck:     Trachea:  Phonation normal.  Cardiovascular:     Rate and Rhythm: Normal rate and regular rhythm.     Heart sounds: Normal heart sounds.  Pulmonary:     Effort: Pulmonary effort is normal.     Breath sounds: Normal breath sounds.  Abdominal:     Palpations: Abdomen is soft.     Tenderness: There is no abdominal tenderness.  Musculoskeletal:        General: Normal range of motion.     Cervical back: Normal range of motion and neck supple.     Comments: While supine, normal strength arms and legs bilaterally.  Skin:    General: Skin is warm and dry.  Neurological:     Mental Status: She is alert and oriented to person, place, and time.     Cranial Nerves: No cranial nerve deficit.     Sensory: No sensory deficit.     Motor: No abnormal muscle tone.     Coordination: Coordination normal.     Comments: No dysarthria or aphasia.  Psychiatric:        Mood and Affect: Mood normal.        Behavior: Behavior normal.        Thought Content: Thought content normal.        Judgment: Judgment normal.    ED Results / Procedures / Treatments   Labs (all labs ordered are listed, but only abnormal results are displayed) Labs Reviewed  COMPREHENSIVE METABOLIC PANEL - Abnormal; Notable for the following components:      Result Value   Glucose, Bld 105 (*)    All other components within normal limits  CBC WITH DIFFERENTIAL/PLATELET - Abnormal; Notable for the following components:   Hemoglobin 15.3 (*)    MCH 34.2 (*)    Neutro Abs 7.8 (*)    All other components within normal limits  URINALYSIS, ROUTINE W REFLEX MICROSCOPIC - Abnormal; Notable for the following components:   Leukocytes,Ua TRACE (*)    All other components within normal limits  CULTURE, BLOOD (SINGLE)  URINE CULTURE  LACTIC ACID, PLASMA  PROTIME-INR  APTT  SEDIMENTATION RATE    EKG None  Radiology CT Head Wo Contrast  Result Date: 08/25/2020 CLINICAL DATA:  Head trauma fall EXAM: CT HEAD WITHOUT CONTRAST CT CERVICAL  SPINE WITHOUT CONTRAST TECHNIQUE: Multidetector CT imaging of the head and cervical spine was performed following the standard protocol without intravenous contrast. Multiplanar CT image reconstructions of the cervical spine were also generated. COMPARISON:  CT 12/16/2019 FINDINGS: CT HEAD FINDINGS Brain: No acute territorial infarction, hemorrhage or intracranial mass. Atrophy and mild chronic small vessel ischemic changes of the white matter. Nonenlarged ventricles Vascular: No hyperdense vessels. Vertebral and carotid vascular calcification Skull: Normal. Negative for fracture or focal lesion. Sinuses/Orbits: No acute finding. Other: None CT CERVICAL SPINE FINDINGS Alignment: Reversal of cervical lordosis. 3 mm anterolisthesis C3 on C4. Trace anterolisthesis C4 on C5. Facet alignment within normal limits. Skull base and vertebrae: No acute fracture. No primary bone lesion or focal pathologic process. Soft tissues and spinal canal: No prevertebral fluid or swelling. No visible canal hematoma. Disc levels: Diffuse degenerative changes throughout the cervical spine with multiple level disc space narrowing  and osteophyte. Facet degenerative changes at multiple levels with foraminal stenosis. Upper chest: Negative. Other: None IMPRESSION: 1. No CT evidence for acute intracranial abnormality. Atrophy and chronic small vessel ischemic changes of the white matter. 2. Degenerative changes of the cervical spine.  No fracture is seen Electronically Signed   By: Donavan Foil M.D.   On: 08/25/2020 20:08   CT Cervical Spine Wo Contrast  Result Date: 08/25/2020 CLINICAL DATA:  Head trauma fall EXAM: CT HEAD WITHOUT CONTRAST CT CERVICAL SPINE WITHOUT CONTRAST TECHNIQUE: Multidetector CT imaging of the head and cervical spine was performed following the standard protocol without intravenous contrast. Multiplanar CT image reconstructions of the cervical spine were also generated. COMPARISON:  CT 12/16/2019 FINDINGS: CT HEAD  FINDINGS Brain: No acute territorial infarction, hemorrhage or intracranial mass. Atrophy and mild chronic small vessel ischemic changes of the white matter. Nonenlarged ventricles Vascular: No hyperdense vessels. Vertebral and carotid vascular calcification Skull: Normal. Negative for fracture or focal lesion. Sinuses/Orbits: No acute finding. Other: None CT CERVICAL SPINE FINDINGS Alignment: Reversal of cervical lordosis. 3 mm anterolisthesis C3 on C4. Trace anterolisthesis C4 on C5. Facet alignment within normal limits. Skull base and vertebrae: No acute fracture. No primary bone lesion or focal pathologic process. Soft tissues and spinal canal: No prevertebral fluid or swelling. No visible canal hematoma. Disc levels: Diffuse degenerative changes throughout the cervical spine with multiple level disc space narrowing and osteophyte. Facet degenerative changes at multiple levels with foraminal stenosis. Upper chest: Negative. Other: None IMPRESSION: 1. No CT evidence for acute intracranial abnormality. Atrophy and chronic small vessel ischemic changes of the white matter. 2. Degenerative changes of the cervical spine.  No fracture is seen Electronically Signed   By: Donavan Foil M.D.   On: 08/25/2020 20:08   DG Chest Port 1 View  Result Date: 08/25/2020 CLINICAL DATA:  Questionable sepsis - evaluate for abnormality Fall 1 week ago.  Progressive weakness since the fall. EXAM: PORTABLE CHEST 1 VIEW COMPARISON:  05/28/2020, chest CT 03/02/2020 FINDINGS: Stable cardiomegaly. Unchanged mediastinal contours with aortic atherosclerosis and tortuosity. There is emphysema with chronic bronchial thickening. No acute airspace disease. No pleural effusion or pneumothorax. Surgical hardware in the right proximal humerus. The bones are diffusely under mineralized. No acute osseous abnormalities are seen. Left upper rib fractures are chronic based on prior CT. IMPRESSION: 1. No acute chest findings. 2. Stable cardiomegaly  and aortic tortuosity. Aortic Atherosclerosis (ICD10-I70.0). 3. Emphysema with chronic bronchial thickening. Electronically Signed   By: Keith Rake M.D.   On: 08/25/2020 19:58    Procedures Procedures   Medications Ordered in ED Medications  fentaNYL (SUBLIMAZE) injection 100 mcg (100 mcg Intravenous Given 08/25/20 2109)  LORazepam (ATIVAN) injection 0.5 mg (0.5 mg Intravenous Given 08/25/20 2059)    ED Course  I have reviewed the triage vital signs and the nursing notes.  Pertinent labs & imaging results that were available during my care of the patient were reviewed by me and considered in my medical decision making (see chart for details).    MDM Rules/Calculators/A&P                            Patient Vitals for the past 24 hrs:  BP Temp Temp src Pulse Resp SpO2  08/25/20 2200 (!) 155/95 -- -- (!) 58 (!) 21 96 %  08/25/20 2130 (!) 173/89 -- -- 70 15 93 %  08/25/20 2100 (!) 180/85 -- -- 68  18 92 %  08/25/20 2050 (!) 194/91 -- -- 65 20 92 %  08/25/20 1930 (!) 203/106 -- -- 75 18 94 %  08/25/20 1807 (!) 180/93 98 F (36.7 C) Oral 68 14 (!) 89 %      Medical Decision Making:  This patient is presenting for evaluation of back pain and tingling and extremity, which does require a range of treatment options, and is a complaint that involves a moderate risk of morbidity and mortality. The differential diagnoses include spinal stenosis, spinal myelopathy, progressive degenerative joint disease of the spine. I decided to review old records, and in summary elderly female being followed by orthopedics, presenting with difficulty walking and tingling in her extremities, concerning for spinal myelopathy.  I did not require additional historical information from anyone.  Clinical Laboratory Tests Ordered, included CBC, Metabolic panel, and lactic acid, blood culture, sedimentation rate . Review indicates normal except hemoglobin elevated, trace leukocytes and urinalysis. Radiologic  Tests Ordered, included CT head and cervical spine, chest x-ray, MRI cervical spine and MRI thoracic spine.  I independently Visualized: Radiograph images, which show CT head and cervical spine and chest x-ray without acute abnormalities.  MRI images pending at time of transfer of care   Critical Interventions-patient with chronic pain presenting with difficulty walking and concerning symptoms of paresthesias aggravated by attempting to walk.  She reports she is unable to walk as usual with her walker.  She has chronic pain, taking oxycodone for years.     After These Interventions, the Patient was reevaluated and was found to require advanced imaging with MRI of the cervical and thoracic spines.  Patient with symptoms consistent with spinal myelopathy, confirmation with MRI required.  CRITICAL CARE-no Performed by: Daleen Bo  Nursing Notes Reviewed/ Care Coordinated Applicable Imaging Reviewed Interpretation of Laboratory Data incorporated into ED treatment   Plan disposition by Dr. Roxanne Mins following return of MRI images.  Final Clinical Impression(s) / ED Diagnoses Final diagnoses:  Osteoarthritis of cervical spine with myelopathy    Rx / DC Orders ED Discharge Orders     None        Daleen Bo, MD 08/26/20 1502

## 2020-08-25 NOTE — ED Provider Notes (Signed)
Emergency Medicine Provider Triage Evaluation Note  [name] , a 75 y.o. female  was evaluated in triage.  Pt complains of weakness with recurring falls in the past week, crying, states she has pain in her neck and all over.  On 2 L submental oxygen by nasal cannula at home at baseline.  Review of Systems  Positive: General weakness progressively worsening, neck pain Negative: Chest pain, shortness of breath, syncope  Physical Exam  BP (!) 180/93 (BP Location: Left Arm)   Pulse 68   Temp 98 F (36.7 C) (Oral)   Resp 14   SpO2 (!) 89%  Gen:   Awake, no distress   Resp:  Normal effort  MSK:   Moves extremities without difficulty  Other:  Crying, states she is in pain.  Lungs CTA B.  RRR no M/R/D.  Abdomen soft, nondistended, nontender.  Strength is symmetric and so sensation in upper and lower extremities.  Medical Decision Making  Medically screening exam initiated at 7:31 PM.  Appropriate orders placed.  [name] was informed that the remainder of the evaluation will be completed by another provider, this initial triage assessment does not replace that evaluation, and the importance of remaining in the ED until their evaluation is complete.  This chart was dictated using voice recognition software, Dragon. Despite the best efforts of this provider to proofread and correct errors, errors may still occur which can change documentation meaning.    [name] 08/25/20 Jade Franco, MD 08/26/20 [phone]

## 2020-08-25 NOTE — ED Notes (Signed)
Pt to MRI via stretcher.

## 2020-08-26 ENCOUNTER — Other Ambulatory Visit: Payer: Self-pay | Admitting: Chiropractic Medicine

## 2020-08-26 ENCOUNTER — Other Ambulatory Visit (HOSPITAL_COMMUNITY): Payer: Self-pay | Admitting: Interventional Radiology

## 2020-08-26 DIAGNOSIS — S22080A Wedge compression fracture of T11-T12 vertebra, initial encounter for closed fracture: Secondary | ICD-10-CM

## 2020-08-26 DIAGNOSIS — M546 Pain in thoracic spine: Secondary | ICD-10-CM

## 2020-08-26 DIAGNOSIS — M542 Cervicalgia: Secondary | ICD-10-CM

## 2020-08-26 NOTE — Discharge Instructions (Addendum)
Your MRI scan did not show anything which needed an acute surgical intervention.  Please continue to work with your physicians, consider going to the neurosurgeon for further evaluation.  Return to the emergency department if you are having any new or concerning symptoms.

## 2020-08-27 ENCOUNTER — Other Ambulatory Visit: Payer: Self-pay | Admitting: Neurological Surgery

## 2020-08-27 ENCOUNTER — Other Ambulatory Visit: Payer: Self-pay | Admitting: Physician Assistant

## 2020-08-27 ENCOUNTER — Inpatient Hospital Stay: Admit: 2020-08-27 | Payer: Medicare Other | Source: Ambulatory Visit | Admitting: Neurological Surgery

## 2020-08-27 DIAGNOSIS — G959 Disease of spinal cord, unspecified: Secondary | ICD-10-CM | POA: Diagnosis not present

## 2020-08-28 ENCOUNTER — Inpatient Hospital Stay (HOSPITAL_COMMUNITY)
Admission: RE | Admit: 2020-08-28 | Discharge: 2020-09-02 | DRG: 472 | Disposition: A | Discharge status: Rehabilitation Facility | Payer: Medicare Other | Attending: Neurological Surgery | Admitting: Neurological Surgery

## 2020-08-28 ENCOUNTER — Inpatient Hospital Stay (HOSPITAL_COMMUNITY): Payer: Medicare Other

## 2020-08-28 ENCOUNTER — Encounter (HOSPITAL_COMMUNITY)
Admission: RE | Disposition: A | Discharge status: Rehabilitation Facility | Payer: Self-pay | Source: Home / Self Care | Attending: Neurological Surgery

## 2020-08-28 ENCOUNTER — Encounter (HOSPITAL_COMMUNITY): Payer: Self-pay

## 2020-08-28 ENCOUNTER — Inpatient Hospital Stay (HOSPITAL_COMMUNITY): Payer: Medicare Other | Admitting: Certified Registered"

## 2020-08-28 ENCOUNTER — Other Ambulatory Visit: Payer: Self-pay

## 2020-08-28 ENCOUNTER — Ambulatory Visit (HOSPITAL_COMMUNITY)
Admission: RE | Admit: 2020-08-28 | Discharge: 2020-08-28 | Disposition: A | Discharge status: Home / Self Care | Payer: Medicare Other | Source: Ambulatory Visit | Attending: Interventional Radiology | Admitting: Interventional Radiology

## 2020-08-28 DIAGNOSIS — N319 Neuromuscular dysfunction of bladder, unspecified: Secondary | ICD-10-CM | POA: Diagnosis not present

## 2020-08-28 DIAGNOSIS — Z8249 Family history of ischemic heart disease and other diseases of the circulatory system: Secondary | ICD-10-CM

## 2020-08-28 DIAGNOSIS — M419 Scoliosis, unspecified: Secondary | ICD-10-CM | POA: Diagnosis present

## 2020-08-28 DIAGNOSIS — R296 Repeated falls: Secondary | ICD-10-CM | POA: Diagnosis present

## 2020-08-28 DIAGNOSIS — I252 Old myocardial infarction: Secondary | ICD-10-CM

## 2020-08-28 DIAGNOSIS — Z905 Acquired absence of kidney: Secondary | ICD-10-CM

## 2020-08-28 DIAGNOSIS — I2510 Atherosclerotic heart disease of native coronary artery without angina pectoris: Secondary | ICD-10-CM | POA: Diagnosis present

## 2020-08-28 DIAGNOSIS — Z79899 Other long term (current) drug therapy: Secondary | ICD-10-CM

## 2020-08-28 DIAGNOSIS — G959 Disease of spinal cord, unspecified: Secondary | ICD-10-CM | POA: Diagnosis not present

## 2020-08-28 DIAGNOSIS — M4802 Spinal stenosis, cervical region: Secondary | ICD-10-CM | POA: Diagnosis not present

## 2020-08-28 DIAGNOSIS — I110 Hypertensive heart disease with heart failure: Secondary | ICD-10-CM | POA: Diagnosis present

## 2020-08-28 DIAGNOSIS — M810 Age-related osteoporosis without current pathological fracture: Secondary | ICD-10-CM | POA: Diagnosis present

## 2020-08-28 DIAGNOSIS — I10 Essential (primary) hypertension: Secondary | ICD-10-CM

## 2020-08-28 DIAGNOSIS — G8929 Other chronic pain: Secondary | ICD-10-CM | POA: Diagnosis present

## 2020-08-28 DIAGNOSIS — Z923 Personal history of irradiation: Secondary | ICD-10-CM | POA: Diagnosis not present

## 2020-08-28 DIAGNOSIS — I739 Peripheral vascular disease, unspecified: Secondary | ICD-10-CM | POA: Diagnosis present

## 2020-08-28 DIAGNOSIS — K219 Gastro-esophageal reflux disease without esophagitis: Secondary | ICD-10-CM | POA: Diagnosis present

## 2020-08-28 DIAGNOSIS — Z955 Presence of coronary angioplasty implant and graft: Secondary | ICD-10-CM | POA: Diagnosis not present

## 2020-08-28 DIAGNOSIS — J439 Emphysema, unspecified: Secondary | ICD-10-CM | POA: Diagnosis present

## 2020-08-28 DIAGNOSIS — S14129A Central cord syndrome at unspecified level of cervical spinal cord, initial encounter: Secondary | ICD-10-CM | POA: Diagnosis present

## 2020-08-28 DIAGNOSIS — S14129D Central cord syndrome at unspecified level of cervical spinal cord, subsequent encounter: Secondary | ICD-10-CM | POA: Diagnosis not present

## 2020-08-28 DIAGNOSIS — Z87891 Personal history of nicotine dependence: Secondary | ICD-10-CM

## 2020-08-28 DIAGNOSIS — J441 Chronic obstructive pulmonary disease with (acute) exacerbation: Secondary | ICD-10-CM | POA: Diagnosis not present

## 2020-08-28 DIAGNOSIS — Z419 Encounter for procedure for purposes other than remedying health state, unspecified: Secondary | ICD-10-CM

## 2020-08-28 DIAGNOSIS — Z85528 Personal history of other malignant neoplasm of kidney: Secondary | ICD-10-CM | POA: Diagnosis not present

## 2020-08-28 DIAGNOSIS — Z9889 Other specified postprocedural states: Secondary | ICD-10-CM | POA: Diagnosis not present

## 2020-08-28 DIAGNOSIS — M7989 Other specified soft tissue disorders: Secondary | ICD-10-CM | POA: Diagnosis not present

## 2020-08-28 DIAGNOSIS — Z90710 Acquired absence of both cervix and uterus: Secondary | ICD-10-CM | POA: Diagnosis not present

## 2020-08-28 DIAGNOSIS — M4712 Other spondylosis with myelopathy, cervical region: Secondary | ICD-10-CM | POA: Diagnosis present

## 2020-08-28 DIAGNOSIS — K592 Neurogenic bowel, not elsewhere classified: Secondary | ICD-10-CM | POA: Diagnosis not present

## 2020-08-28 DIAGNOSIS — F319 Bipolar disorder, unspecified: Secondary | ICD-10-CM | POA: Diagnosis present

## 2020-08-28 DIAGNOSIS — I5032 Chronic diastolic (congestive) heart failure: Secondary | ICD-10-CM | POA: Diagnosis present

## 2020-08-28 DIAGNOSIS — S22080A Wedge compression fracture of T11-T12 vertebra, initial encounter for closed fracture: Secondary | ICD-10-CM

## 2020-08-28 DIAGNOSIS — G8918 Other acute postprocedural pain: Secondary | ICD-10-CM

## 2020-08-28 DIAGNOSIS — Z20822 Contact with and (suspected) exposure to covid-19: Secondary | ICD-10-CM | POA: Diagnosis present

## 2020-08-28 DIAGNOSIS — Z8544 Personal history of malignant neoplasm of other female genital organs: Secondary | ICD-10-CM

## 2020-08-28 DIAGNOSIS — S14129S Central cord syndrome at unspecified level of cervical spinal cord, sequela: Secondary | ICD-10-CM | POA: Diagnosis not present

## 2020-08-28 HISTORY — PX: POSTERIOR CERVICAL LAMINECTOMY: SHX2248

## 2020-08-28 LAB — SURGICAL PCR SCREEN
MRSA, PCR: NEGATIVE
Staphylococcus aureus: NEGATIVE

## 2020-08-28 LAB — TYPE AND SCREEN
ABO/RH(D): B POS
Antibody Screen: NEGATIVE

## 2020-08-28 LAB — SARS CORONAVIRUS 2 BY RT PCR (HOSPITAL ORDER, PERFORMED IN ~~LOC~~ HOSPITAL LAB): SARS Coronavirus 2: NEGATIVE

## 2020-08-28 SURGERY — POSTERIOR CERVICAL LAMINECTOMY
Anesthesia: General | Site: Spine Cervical

## 2020-08-28 MED ORDER — ATORVASTATIN CALCIUM 10 MG PO TABS
20.0000 mg | ORAL_TABLET | Freq: Every day | ORAL | Status: DC
  Administered 2020-08-28 – 2020-09-02 (×6): 20 mg via ORAL
  Filled 2020-08-28 (×5): qty 2

## 2020-08-28 MED ORDER — HEMOSTATIC AGENTS (NO CHARGE) OPTIME
TOPICAL | Status: DC | PRN
  Administered 2020-08-28: 1 via TOPICAL

## 2020-08-28 MED ORDER — ONDANSETRON HCL 4 MG/2ML IJ SOLN: 4.0000 mg | Freq: Once | INTRAMUSCULAR | Status: DC | PRN

## 2020-08-28 MED ORDER — CEFAZOLIN SODIUM-DEXTROSE 2-4 GM/100ML-% IV SOLN: 2.0000 g | Freq: Once | INTRAVENOUS | Status: DC

## 2020-08-28 MED ORDER — PROPOFOL 500 MG/50ML IV EMUL
INTRAVENOUS | Status: DC | PRN
  Administered 2020-08-28: 55 ug/kg/min via INTRAVENOUS

## 2020-08-28 MED ORDER — DOCUSATE SODIUM 100 MG PO CAPS
100.0000 mg | ORAL_CAPSULE | Freq: Every day | ORAL | Status: DC | PRN
  Administered 2020-09-01: 100 mg via ORAL
  Filled 2020-08-28: qty 1

## 2020-08-28 MED ORDER — FENTANYL CITRATE (PF) 100 MCG/2ML IJ SOLN
50.0000 ug | Freq: Once | INTRAMUSCULAR | Status: AC
  Administered 2020-08-28: 50 ug via INTRAVENOUS
  Filled 2020-08-28: qty 2

## 2020-08-28 MED ORDER — ALBUTEROL SULFATE (2.5 MG/3ML) 0.083% IN NEBU: 3.0000 mL | INHALATION_SOLUTION | Freq: Four times a day (QID) | RESPIRATORY_TRACT | Status: DC | PRN

## 2020-08-28 MED ORDER — VANCOMYCIN HCL 1000 MG IV SOLR
INTRAVENOUS | Status: AC
  Filled 2020-08-28: qty 1000

## 2020-08-28 MED ORDER — BUPIVACAINE-EPINEPHRINE 0.5% -1:200000 IJ SOLN
INTRAMUSCULAR | Status: DC | PRN
  Administered 2020-08-28: 5 mL

## 2020-08-28 MED ORDER — CHLORHEXIDINE GLUCONATE CLOTH 2 % EX PADS: 6.0000 | MEDICATED_PAD | Freq: Once | CUTANEOUS | Status: DC

## 2020-08-28 MED ORDER — PHENOL 1.4 % MT LIQD: 1.0000 | OROMUCOSAL | Status: DC | PRN

## 2020-08-28 MED ORDER — PROPOFOL 10 MG/ML IV BOLUS
INTRAVENOUS | Status: AC
  Filled 2020-08-28: qty 40

## 2020-08-28 MED ORDER — LIDOCAINE 2% (20 MG/ML) 5 ML SYRINGE
INTRAMUSCULAR | Status: AC
  Filled 2020-08-28: qty 5

## 2020-08-28 MED ORDER — SODIUM CHLORIDE 0.9 % IV SOLN: INTRAVENOUS | Status: DC

## 2020-08-28 MED ORDER — LIDOCAINE 2% (20 MG/ML) 5 ML SYRINGE
INTRAMUSCULAR | Status: DC | PRN
  Administered 2020-08-28: 60 mg via INTRAVENOUS

## 2020-08-28 MED ORDER — LAMOTRIGINE 100 MG PO TABS
200.0000 mg | ORAL_TABLET | Freq: Two times a day (BID) | ORAL | Status: DC
  Administered 2020-08-28 – 2020-09-02 (×10): 200 mg via ORAL
  Filled 2020-08-28 (×10): qty 2

## 2020-08-28 MED ORDER — SUCCINYLCHOLINE 20MG/ML (10ML) SYRINGE FOR MEDFUSION PUMP - OPTIME
INTRAMUSCULAR | Status: DC | PRN
  Administered 2020-08-28: 60 mg via INTRAVENOUS

## 2020-08-28 MED ORDER — ONDANSETRON HCL 4 MG PO TABS
4.0000 mg | ORAL_TABLET | Freq: Four times a day (QID) | ORAL | Status: DC | PRN
  Administered 2020-08-31: 4 mg via ORAL
  Filled 2020-08-28: qty 1

## 2020-08-28 MED ORDER — HYDRALAZINE HCL 25 MG PO TABS
25.0000 mg | ORAL_TABLET | Freq: Two times a day (BID) | ORAL | Status: DC
  Administered 2020-08-28 – 2020-09-02 (×10): 25 mg via ORAL
  Filled 2020-08-28 (×10): qty 1

## 2020-08-28 MED ORDER — HYDROMORPHONE HCL 1 MG/ML IJ SOLN
INTRAMUSCULAR | Status: AC
  Filled 2020-08-28: qty 1

## 2020-08-28 MED ORDER — ACETAMINOPHEN 650 MG RE SUPP: 650.0000 mg | RECTAL | Status: DC | PRN

## 2020-08-28 MED ORDER — ONDANSETRON HCL 4 MG/2ML IJ SOLN
INTRAMUSCULAR | Status: AC
  Filled 2020-08-28: qty 2

## 2020-08-28 MED ORDER — BUDESON-GLYCOPYRROL-FORMOTEROL 160-9-4.8 MCG/ACT IN AERO: 2.0000 | INHALATION_SPRAY | Freq: Two times a day (BID) | RESPIRATORY_TRACT | Status: DC | PRN

## 2020-08-28 MED ORDER — THROMBIN 5000 UNITS EX SOLR: OROMUCOSAL | Status: DC | PRN

## 2020-08-28 MED ORDER — LABETALOL HCL 5 MG/ML IV SOLN
INTRAVENOUS | Status: DC | PRN
  Administered 2020-08-28 (×2): 5 mg via INTRAVENOUS

## 2020-08-28 MED ORDER — THROMBIN 5000 UNITS EX SOLR
CUTANEOUS | Status: DC | PRN
  Administered 2020-08-28 (×2): 5000 [IU] via TOPICAL

## 2020-08-28 MED ORDER — ONDANSETRON HCL 4 MG/2ML IJ SOLN: 4.0000 mg | Freq: Four times a day (QID) | INTRAMUSCULAR | Status: DC | PRN

## 2020-08-28 MED ORDER — CEFAZOLIN SODIUM-DEXTROSE 2-4 GM/100ML-% IV SOLN
INTRAVENOUS | Status: AC
  Filled 2020-08-28: qty 100

## 2020-08-28 MED ORDER — LACTATED RINGERS IV SOLN: INTRAVENOUS | Status: DC

## 2020-08-28 MED ORDER — IPRATROPIUM-ALBUTEROL 0.5-2.5 (3) MG/3ML IN SOLN: 3.0000 mL | Freq: Four times a day (QID) | RESPIRATORY_TRACT | Status: DC | PRN

## 2020-08-28 MED ORDER — ARIPIPRAZOLE 10 MG PO TABS
10.0000 mg | ORAL_TABLET | Freq: Every day | ORAL | Status: DC
  Administered 2020-08-28 – 2020-09-02 (×6): 10 mg via ORAL
  Filled 2020-08-28 (×5): qty 1

## 2020-08-28 MED ORDER — LIDOCAINE-EPINEPHRINE 1 %-1:100000 IJ SOLN
INTRAMUSCULAR | Status: DC | PRN
  Administered 2020-08-28: 5 mL

## 2020-08-28 MED ORDER — ONDANSETRON HCL 4 MG/2ML IJ SOLN
INTRAMUSCULAR | Status: DC | PRN
  Administered 2020-08-28: 4 mg via INTRAVENOUS

## 2020-08-28 MED ORDER — CEFAZOLIN SODIUM-DEXTROSE 2-4 GM/100ML-% IV SOLN: 2.0000 g | INTRAVENOUS | Status: DC

## 2020-08-28 MED ORDER — SUGAMMADEX SODIUM 200 MG/2ML IV SOLN: INTRAVENOUS | Status: DC | PRN

## 2020-08-28 MED ORDER — LIDOCAINE-EPINEPHRINE 1 %-1:100000 IJ SOLN
INTRAMUSCULAR | Status: AC
  Filled 2020-08-28: qty 1

## 2020-08-28 MED ORDER — OXYCODONE HCL 5 MG/5ML PO SOLN: 5.0000 mg | Freq: Once | ORAL | Status: DC | PRN

## 2020-08-28 MED ORDER — CEFAZOLIN SODIUM-DEXTROSE 2-3 GM-%(50ML) IV SOLR
INTRAVENOUS | Status: DC | PRN
  Administered 2020-08-28: 2 g via INTRAVENOUS

## 2020-08-28 MED ORDER — FENTANYL CITRATE (PF) 100 MCG/2ML IJ SOLN
INTRAMUSCULAR | Status: AC
  Filled 2020-08-28: qty 2

## 2020-08-28 MED ORDER — BUPIVACAINE-EPINEPHRINE 0.5% -1:200000 IJ SOLN
INTRAMUSCULAR | Status: AC
  Filled 2020-08-28: qty 1

## 2020-08-28 MED ORDER — OXYCODONE HCL 5 MG PO TABS: 5.0000 mg | ORAL_TABLET | Freq: Once | ORAL | Status: DC | PRN

## 2020-08-28 MED ORDER — THROMBIN 5000 UNITS EX SOLR
CUTANEOUS | Status: AC
  Filled 2020-08-28: qty 10000

## 2020-08-28 MED ORDER — FENTANYL CITRATE (PF) 250 MCG/5ML IJ SOLN
INTRAMUSCULAR | Status: AC
  Filled 2020-08-28: qty 5

## 2020-08-28 MED ORDER — OXYCODONE HCL 5 MG PO TABS
10.0000 mg | ORAL_TABLET | ORAL | Status: DC | PRN
  Administered 2020-08-28 – 2020-09-01 (×21): 10 mg via ORAL
  Filled 2020-08-28 (×21): qty 2

## 2020-08-28 MED ORDER — ACETAMINOPHEN 325 MG PO TABS
650.0000 mg | ORAL_TABLET | ORAL | Status: DC | PRN
  Administered 2020-08-30 – 2020-08-31 (×3): 650 mg via ORAL
  Filled 2020-08-28 (×3): qty 2

## 2020-08-28 MED ORDER — ACETAMINOPHEN 500 MG PO TABS
1000.0000 mg | ORAL_TABLET | Freq: Once | ORAL | Status: AC
  Administered 2020-08-28: 1000 mg via ORAL
  Filled 2020-08-28: qty 2

## 2020-08-28 MED ORDER — METHOCARBAMOL 500 MG PO TABS
500.0000 mg | ORAL_TABLET | Freq: Four times a day (QID) | ORAL | Status: DC | PRN
  Administered 2020-08-28 – 2020-08-30 (×5): 500 mg via ORAL
  Filled 2020-08-28 (×5): qty 1

## 2020-08-28 MED ORDER — KETAMINE HCL 50 MG/5ML IJ SOSY
PREFILLED_SYRINGE | INTRAMUSCULAR | Status: AC
  Filled 2020-08-28: qty 5

## 2020-08-28 MED ORDER — FUROSEMIDE 20 MG PO TABS
20.0000 mg | ORAL_TABLET | Freq: Every day | ORAL | Status: DC
  Administered 2020-08-29 – 2020-09-02 (×5): 20 mg via ORAL
  Filled 2020-08-28 (×5): qty 1

## 2020-08-28 MED ORDER — 0.9 % SODIUM CHLORIDE (POUR BTL) OPTIME
TOPICAL | Status: DC | PRN
  Administered 2020-08-28: 1000 mL

## 2020-08-28 MED ORDER — SODIUM CHLORIDE 0.9% FLUSH: 3.0000 mL | INTRAVENOUS | Status: DC | PRN

## 2020-08-28 MED ORDER — NITROGLYCERIN 0.4 MG SL SUBL: 0.4000 mg | SUBLINGUAL_TABLET | SUBLINGUAL | Status: DC | PRN

## 2020-08-28 MED ORDER — ROCURONIUM BROMIDE 10 MG/ML (PF) SYRINGE
PREFILLED_SYRINGE | INTRAVENOUS | Status: DC | PRN
  Administered 2020-08-28: 50 mg via INTRAVENOUS

## 2020-08-28 MED ORDER — SERTRALINE HCL 50 MG PO TABS
50.0000 mg | ORAL_TABLET | Freq: Every day | ORAL | Status: DC
  Administered 2020-08-28 – 2020-09-02 (×6): 50 mg via ORAL
  Filled 2020-08-28 (×5): qty 1

## 2020-08-28 MED ORDER — LABETALOL HCL 5 MG/ML IV SOLN
INTRAVENOUS | Status: AC
  Filled 2020-08-28: qty 4

## 2020-08-28 MED ORDER — METHOCARBAMOL 1000 MG/10ML IJ SOLN
500.0000 mg | Freq: Four times a day (QID) | INTRAVENOUS | Status: DC | PRN
  Filled 2020-08-28: qty 5

## 2020-08-28 MED ORDER — MONTELUKAST SODIUM 10 MG PO TABS
10.0000 mg | ORAL_TABLET | Freq: Every day | ORAL | Status: DC
  Administered 2020-08-28 – 2020-09-01 (×5): 10 mg via ORAL
  Filled 2020-08-28 (×5): qty 1

## 2020-08-28 MED ORDER — FENTANYL CITRATE (PF) 100 MCG/2ML IJ SOLN
25.0000 ug | INTRAMUSCULAR | Status: DC | PRN
  Administered 2020-08-28 (×3): 50 ug via INTRAVENOUS

## 2020-08-28 MED ORDER — POTASSIUM CHLORIDE ER 8 MEQ PO TBCR
8.0000 meq | EXTENDED_RELEASE_TABLET | Freq: Every day | ORAL | Status: DC
  Filled 2020-08-28: qty 1

## 2020-08-28 MED ORDER — HYDROMORPHONE HCL 1 MG/ML IJ SOLN
0.5000 mg | INTRAMUSCULAR | Status: DC | PRN
  Administered 2020-08-29 – 2020-08-30 (×7): 0.5 mg via INTRAVENOUS
  Filled 2020-08-28 (×8): qty 1

## 2020-08-28 MED ORDER — AMLODIPINE BESYLATE 10 MG PO TABS
10.0000 mg | ORAL_TABLET | Freq: Every day | ORAL | Status: DC
  Administered 2020-08-28 – 2020-09-02 (×6): 10 mg via ORAL
  Filled 2020-08-28 (×5): qty 1

## 2020-08-28 MED ORDER — SODIUM CHLORIDE 0.9% IV SOLUTION: Freq: Once | INTRAVENOUS | Status: DC

## 2020-08-28 MED ORDER — HYDROMORPHONE HCL 1 MG/ML IJ SOLN
INTRAMUSCULAR | Status: DC | PRN
  Administered 2020-08-28: .5 mg via INTRAVENOUS
  Administered 2020-08-28 (×2): .25 mg via INTRAVENOUS

## 2020-08-28 MED ORDER — SODIUM CHLORIDE 0.9% FLUSH
3.0000 mL | Freq: Two times a day (BID) | INTRAVENOUS | Status: DC
  Administered 2020-08-28 – 2020-09-01 (×7): 3 mL via INTRAVENOUS

## 2020-08-28 MED ORDER — SUGAMMADEX SODIUM 200 MG/2ML IV SOLN
INTRAVENOUS | Status: DC | PRN
  Administered 2020-08-28: 200 mg via INTRAVENOUS

## 2020-08-28 MED ORDER — PHENYLEPHRINE 40 MCG/ML (10ML) SYRINGE FOR IV PUSH (FOR BLOOD PRESSURE SUPPORT)
PREFILLED_SYRINGE | INTRAVENOUS | Status: DC | PRN
  Administered 2020-08-28: 80 ug via INTRAVENOUS

## 2020-08-28 MED ORDER — KETAMINE HCL 10 MG/ML IJ SOLN
INTRAMUSCULAR | Status: DC | PRN
  Administered 2020-08-28: 25 mg via INTRAVENOUS
  Administered 2020-08-28: 10 mg via INTRAVENOUS

## 2020-08-28 MED ORDER — SODIUM CHLORIDE 0.9 % IV SOLN
250.0000 mL | INTRAVENOUS | Status: DC
  Administered 2020-08-28: 250 mL via INTRAVENOUS

## 2020-08-28 MED ORDER — CEFAZOLIN SODIUM-DEXTROSE 2-4 GM/100ML-% IV SOLN
2.0000 g | Freq: Three times a day (TID) | INTRAVENOUS | Status: AC
  Administered 2020-08-29 (×2): 2 g via INTRAVENOUS
  Filled 2020-08-28 (×2): qty 100

## 2020-08-28 MED ORDER — FLUTICASONE FUROATE-VILANTEROL 200-25 MCG/INH IN AEPB
1.0000 | INHALATION_SPRAY | Freq: Every day | RESPIRATORY_TRACT | Status: DC
  Administered 2020-08-29 – 2020-09-02 (×5): 1 via RESPIRATORY_TRACT
  Filled 2020-08-28: qty 28

## 2020-08-28 MED ORDER — UMECLIDINIUM BROMIDE 62.5 MCG/INH IN AEPB
1.0000 | INHALATION_SPRAY | Freq: Every day | RESPIRATORY_TRACT | Status: DC
  Administered 2020-08-29 – 2020-09-02 (×5): 1 via RESPIRATORY_TRACT
  Filled 2020-08-28: qty 7

## 2020-08-28 MED ORDER — DEXAMETHASONE SODIUM PHOSPHATE 10 MG/ML IJ SOLN
INTRAMUSCULAR | Status: AC
  Filled 2020-08-28: qty 1

## 2020-08-28 MED ORDER — TOBRAMYCIN SULFATE 1.2 G IJ SOLR
INTRAMUSCULAR | Status: AC
  Filled 2020-08-28: qty 1.2

## 2020-08-28 MED ORDER — FENTANYL CITRATE (PF) 250 MCG/5ML IJ SOLN
INTRAMUSCULAR | Status: DC | PRN
  Administered 2020-08-28: 100 ug via INTRAVENOUS
  Administered 2020-08-28: 50 ug via INTRAVENOUS
  Administered 2020-08-28: 100 ug via INTRAVENOUS

## 2020-08-28 MED ORDER — MENTHOL 3 MG MT LOZG: 1.0000 | LOZENGE | OROMUCOSAL | Status: DC | PRN

## 2020-08-28 MED ORDER — PANTOPRAZOLE SODIUM 40 MG PO TBEC
40.0000 mg | DELAYED_RELEASE_TABLET | Freq: Every day | ORAL | Status: DC
  Administered 2020-08-28 – 2020-09-02 (×6): 40 mg via ORAL
  Filled 2020-08-28 (×5): qty 1

## 2020-08-28 MED ORDER — ROCURONIUM BROMIDE 10 MG/ML (PF) SYRINGE
PREFILLED_SYRINGE | INTRAVENOUS | Status: AC
  Filled 2020-08-28: qty 10

## 2020-08-28 MED ORDER — BUPIVACAINE HCL (PF) 0.5 % IJ SOLN
INTRAMUSCULAR | Status: AC
  Filled 2020-08-28: qty 30

## 2020-08-28 MED ORDER — CHLORHEXIDINE GLUCONATE 0.12 % MT SOLN
OROMUCOSAL | Status: AC
  Administered 2020-08-28: 15 mL
  Filled 2020-08-28: qty 15

## 2020-08-28 MED ORDER — BACITRACIN ZINC 500 UNIT/GM EX OINT
TOPICAL_OINTMENT | CUTANEOUS | Status: AC
  Filled 2020-08-28: qty 28.35

## 2020-08-28 MED ORDER — THROMBIN 5000 UNITS EX SOLR
CUTANEOUS | Status: AC
  Filled 2020-08-28: qty 5000

## 2020-08-28 MED ORDER — AMLODIPINE-ATORVASTATIN 10-20 MG PO TABS: 1.0000 | ORAL_TABLET | Freq: Every day | ORAL | Status: DC

## 2020-08-28 MED ORDER — PROPOFOL 10 MG/ML IV BOLUS
INTRAVENOUS | Status: DC | PRN
  Administered 2020-08-28: 50 mg via INTRAVENOUS
  Administered 2020-08-28: 100 mg via INTRAVENOUS

## 2020-08-28 MED ORDER — LACTATED RINGERS IV SOLN: INTRAVENOUS | Status: DC | PRN

## 2020-08-28 MED ORDER — IRBESARTAN 300 MG PO TABS
300.0000 mg | ORAL_TABLET | Freq: Every day | ORAL | Status: DC
  Administered 2020-08-29 – 2020-09-02 (×5): 300 mg via ORAL
  Filled 2020-08-28 (×5): qty 1

## 2020-08-28 MED ORDER — THROMBIN 20000 UNITS EX SOLR
CUTANEOUS | Status: AC
  Filled 2020-08-28: qty 20000

## 2020-08-28 SURGICAL SUPPLY — 76 items
ADH SKN CLS APL DERMABOND .7 (GAUZE/BANDAGES/DRESSINGS) ×2
BAG COUNTER SPONGE SURGICOUNT (BAG) ×6 IMPLANT
BAG SPNG CNTER NS LX DISP (BAG) ×4
BAND INSRT 18 STRL LF DISP RB (MISCELLANEOUS) ×4
BAND RUBBER #18 3X1/16 STRL (MISCELLANEOUS) ×8 IMPLANT
BIT DRILL NEURO 2X3.1 SFT TUCH (MISCELLANEOUS) ×3 IMPLANT
BLADE CLIPPER SURG (BLADE) ×2 IMPLANT
BNDG GAUZE ELAST 4 BULKY (GAUZE/BANDAGES/DRESSINGS) ×1 IMPLANT
BUR CARBIDE MATCH 3.0 (BURR) ×1 IMPLANT
BUR MATCHSTICK NEURO 3.0 LAGG (BURR) IMPLANT
BUR SABER NEURO 2.5 (BURR) IMPLANT
CANISTER SUCT 3000ML PPV (MISCELLANEOUS) ×3 IMPLANT
CNTNR URN SCR LID CUP LEK RST (MISCELLANEOUS) ×2 IMPLANT
CONT SPEC 4OZ STRL OR WHT (MISCELLANEOUS) ×3
COVER BACK TABLE 60X90IN (DRAPES) ×3 IMPLANT
DERMABOND ADVANCED (GAUZE/BANDAGES/DRESSINGS) ×1
DERMABOND ADVANCED .7 DNX12 (GAUZE/BANDAGES/DRESSINGS) ×2 IMPLANT
DRAPE C-ARM 42X72 X-RAY (DRAPES) ×6 IMPLANT
DRAPE HALF SHEET 40X57 (DRAPES) IMPLANT
DRAPE LAPAROTOMY 100X72X124 (DRAPES) ×4 IMPLANT
DRAPE MICROSCOPE LEICA (MISCELLANEOUS) ×4 IMPLANT
DRILL NEURO 2X3.1 SOFT TOUCH (MISCELLANEOUS) ×3
DRSG OPSITE POSTOP 4X6 (GAUZE/BANDAGES/DRESSINGS) ×3 IMPLANT
DURAPREP 6ML APPLICATOR 50/CS (WOUND CARE) ×3 IMPLANT
ELECT BLADE INSULATED 4IN (ELECTROSURGICAL)
ELECT BLADE INSULATED 6.5IN (ELECTROSURGICAL)
ELECT COATED BLADE 2.86 ST (ELECTRODE) ×3 IMPLANT
ELECT REM PT RETURN 9FT ADLT (ELECTROSURGICAL) ×3
ELECTRODE BLADE INSULATED 4IN (ELECTROSURGICAL) IMPLANT
ELECTRODE BLDE INSULATED 6.5IN (ELECTROSURGICAL) IMPLANT
ELECTRODE REM PT RTRN 9FT ADLT (ELECTROSURGICAL) ×2 IMPLANT
EVACUATOR 1/8 PVC DRAIN (DRAIN) IMPLANT
FEE INTRAOP CADWELL SUPPLY NCS (MISCELLANEOUS) ×1 IMPLANT
FEE INTRAOP MONITOR IMPULS NCS (MISCELLANEOUS) ×2 IMPLANT
GAUZE 4X4 16PLY ~~LOC~~+RFID DBL (SPONGE) ×2 IMPLANT
GAUZE SPONGE 4X4 12PLY STRL (GAUZE/BANDAGES/DRESSINGS) ×1 IMPLANT
GLOVE SRG 8 PF TXTR STRL LF DI (GLOVE) ×5 IMPLANT
GLOVE SURG LTX SZ8 (GLOVE) ×9 IMPLANT
GLOVE SURG UNDER POLY LF SZ8 (GLOVE) ×9
GOWN STRL REUS W/ TWL LRG LVL3 (GOWN DISPOSABLE) IMPLANT
GOWN STRL REUS W/ TWL XL LVL3 (GOWN DISPOSABLE) ×4 IMPLANT
GOWN STRL REUS W/TWL 2XL LVL3 (GOWN DISPOSABLE) ×4 IMPLANT
GOWN STRL REUS W/TWL LRG LVL3 (GOWN DISPOSABLE)
GOWN STRL REUS W/TWL XL LVL3 (GOWN DISPOSABLE) ×6
HEMOSTAT POWDER KIT SURGIFOAM (HEMOSTASIS) ×4 IMPLANT
INTRAOP CADWELL SUPPLY FEE NCS (MISCELLANEOUS) ×2
INTRAOP DISP SUPPLY FEE NCS (MISCELLANEOUS) ×3
INTRAOP MONITOR FEE IMPULS NCS (MISCELLANEOUS) ×2
INTRAOP MONITOR FEE IMPULSE (MISCELLANEOUS) ×3
KIT BASIN OR (CUSTOM PROCEDURE TRAY) ×4 IMPLANT
KIT TURNOVER KIT B (KITS) ×4 IMPLANT
MARKER SKIN DUAL TIP RULER LAB (MISCELLANEOUS) ×3 IMPLANT
NDL HYPO 25X1 1.5 SAFETY (NEEDLE) ×1 IMPLANT
NDL SPNL 18GX3.5 QUINCKE PK (NEEDLE) ×3 IMPLANT
NEEDLE HYPO 25X1 1.5 SAFETY (NEEDLE) ×3 IMPLANT
NEEDLE SPNL 18GX3.5 QUINCKE PK (NEEDLE) ×6 IMPLANT
NS IRRIG 1000ML POUR BTL (IV SOLUTION) ×4 IMPLANT
PACK LAMINECTOMY NEURO (CUSTOM PROCEDURE TRAY) ×4 IMPLANT
PUTTY BONE 100 VESUVIUS 2.5CC (Putty) ×2 IMPLANT
ROD CONT YUKON 3.5X35 (Rod) ×4 IMPLANT
SCREW PA YUKON 4X16 (Screw) ×4 IMPLANT
SCREW SET SPINAL YUKON (Set) ×8 IMPLANT
SCREW SPIN YUKON POLY 03.5X14 (Screw) ×4 IMPLANT
SPONGE SURGIFOAM ABS GEL SZ50 (HEMOSTASIS) ×3 IMPLANT
SPONGE T-LAP 4X18 ~~LOC~~+RFID (SPONGE) ×2 IMPLANT
STAPLER VISISTAT 35W (STAPLE) ×2 IMPLANT
STRIP CLOSURE SKIN 1/2X4 (GAUZE/BANDAGES/DRESSINGS) ×1 IMPLANT
SUT VIC AB 0 CT1 27 (SUTURE) ×3
SUT VIC AB 0 CT1 27XBRD ANBCTR (SUTURE) ×2 IMPLANT
SUT VIC AB 2-0 CP2 18 (SUTURE) ×3 IMPLANT
TAP YUKON 3.0 DISP (TAP) ×2 IMPLANT
TAP YUKON 3.5 DISP (TAP) ×2 IMPLANT
TOWEL GREEN STERILE (TOWEL DISPOSABLE) ×3 IMPLANT
TOWEL GREEN STERILE FF (TOWEL DISPOSABLE) ×4 IMPLANT
TRAY FOLEY MTR SLVR 16FR STAT (SET/KITS/TRAYS/PACK) ×3 IMPLANT
WATER STERILE IRR 1000ML POUR (IV SOLUTION) ×4 IMPLANT

## 2020-08-28 NOTE — Progress Notes (Signed)
   Providing Compassionate, Quality Care - Together  NEUROSURGERY PROGRESS NOTE   S: Patient seen and examined in recovery.  States numbness and tingling is improved  O: EXAM:  BP (!) 175/76 (BP Location: Left Arm)   Pulse 79   Temp (!) 97.2 F (36.2 C)   Resp 20   Ht '5\' 3"'$  (1.6 m)   Wt 77.1 kg   SpO2 94%   BMI 30.11 kg/m   Awake, alert, oriented  Speech fluent, appropriate  CNs grossly intact  4+/5 BLE  BUE delt 3/5, bi/tri/grip 4/5 Incision clean dry and intact.   Cervical collar applied   ASSESSMENT:  75 y.o. female with   Cervical spondylotic myelopathy, C2-4 stenosis Central cord syndrome  Status post C2-4 open laminectomy, instrumentation and fusion on 08/28/2020  PLAN: -PT/OT, likely will need rehab -Pain control -Monitor Hemovac -Collar    Thank you for allowing me to participate in this patient's care.  Please do not hesitate to call with questions or concerns.   Elwin Sleight, [facility] Cell: [phone]

## 2020-08-28 NOTE — Progress Notes (Signed)
Orthopedic Tech Progress Note Patient Details:  [name] 04/10/1945 [id number] Aspen collar was given to patient's nurse for application Ortho Devices Type of Ortho Device: Aspen cervical collar Ortho Device/Splint Interventions: [name] 08/28/2020, 6:59 PM

## 2020-08-28 NOTE — H&P (Signed)
Providing Compassionate, Quality Care - Together  NEUROSURGERY HISTORY & PHYSICAL   ARIZONA CORNIER is an 75 y.o. female.   Chief Complaint: Numbness tingling, weakness, difficulty walking HPI: This is a 75 year old female who had a ground-level fall approximately 1 week ago and since then has had rapidly progressive weakness in her upper extremities, worsening difficulty walking.  She has been unable to walk with her walker as she normally does.  She can hardly stand due to her weakness.  She has severe numbness in bilateral arms and hands, right greater than left.  She also complains of weakness in her hands and grip.  She has been dropping objects.  She came to the emergency department on 08/25/2020 and MRI of the cervical spine revealed severe stenosis at C2-3 and C3-4.  She was seen in our office by Dr. Ronnald Ramp on 08/27/2020 in which we recommended urgent surgical decompression and instrumentation given her rapidly progressive symptoms over the past 5 to 6 days.  She denies any bowel or bladder changes.  She was with her brother, and her best friend in the office yesterday and today with her best friend.  Answered all of their questions.  There is no change in her symptomatology since yesterday.  She takes 81 mg of aspirin, last dose was yesterday.  She was given platelets in preop.  Past Medical History:  Diagnosis Date   Alcoholism ([place])    recovering since 2000   Anxiety and depression    Arthritis BACK   Bipolar disorder ([place])    Blepharospasm LEFT EYE   CHF exacerbation ([place]) 02/24/2017   Chronic back pain    COPD (chronic obstructive pulmonary disease) (HCC)    Coronary artery disease CARDIOLOGIST- DR [place]--- LAST VISIT NOTE 09-07-2009  W/ CHART   Emphysema    GERD (gastroesophageal reflux disease)    History of alcohol abuse RECOVERING SINCE 2000   History of Left renal mass 03/02/2012   underwent partial nephrectomy   HTN (hypertension)    Idiopathic acute facial nerve  palsy LEFT SIDE--  BOTOX THERAPY   Inferior MI ([place]) 2000--  POST PTCA W/ STENT X1   Osteoporosis    Other and unspecified general anesthetics causing adverse effect in therapeutic use post op delirium--  last anes record w/ chart  from   09-22-2009 (spinal w/ light sedation)   Peripheral vascular disease ([place]) POST RIGHT CAROTID SURG.  1995   Renal cell carcinoma ([place]) 07/09/12   Left mass   Rosacea LEFT FACIAL RASH   S/P radiation therapy 02/21/2012   38.75 Gy HDR 5 Fractions- vaginal cuff   Scoliosis    Seizures (HCC)    x 1 after abrupt discontinuation of  Clonidine   Status post carotid endarterectomy RIGHT --  1995   Status post primary angioplasty with coronary stent 2000--  POST INFERIOR MI   Unstable balance WALKS W/ CANE   Vaginal cancer ([place])     Past Surgical History:  Procedure Laterality Date   BREAST EXCISIONAL BIOPSY Left 2019   b9 axilla Bx X 3   CAROTID ENDARTERECTOMY  1995   RIGHT   CATARACT EXTRACTION W/ INTRAOCULAR LENS  IMPLANT, BILATERAL     CERVICAL CONIZATION W/BX  09-23-2008   CORONARY ANGIOPLASTY WITH STENT PLACEMENT  2000-   INFERIOR MI   X1 STENT TO RCA   EUS N/A 03/05/2012   Procedure: FULL UPPER ENDOSCOPIC ULTRASOUND (EUS) RADIAL and EGD;  Surgeon: Milus Banister, MD;  Location:  [facility];  Service: Endoscopy;  Laterality: N/A;  ercp scope first than eus scope   HEMIARTHROPLASTY HIP  12-26-2008   LEFT FEMORAL NECK FX   ORIF HIP FRACTURE  02-13-2007   RIGHT FEMORAL NECK FX   ORIF RIGHT DISTAL RADIUS AND RIGHT PROXIMAL HUMEROUS NECK FX'S  10-10-2005   RIGHT SHOULDER SURG.  2007   ROBOTIC ASSITED PARTIAL NEPHRECTOMY Left 07/09/2012   Procedure: ROBOTIC ASSITED PARTIAL NEPHRECTOMY;  Surgeon: Dutch Gray, MD;  Location: [facility];  Service: Urology;  Laterality: Left;   TOTAL HIP ARTHROPLASTY  04-15-2008   POST FAILED  RIGHT HIP ORIF FEMORAL FX   TOTAL KNEE ARTHROPLASTY  09-22-2009   RIGHT   UPPER RIGHT VAGINAL REGION  12/28/11   BIOPSY: SQUAMOUS CELL  CARCINOMA   VAGINAL HYSTERECTOMY  07/06/2009   Secondary to dysplasia    Family History  Problem Relation Age of Onset   Hypertension Mother    Hypertension Father    Cancer Maternal Grandfather        type unknown   Hypertension Sister    Social History:  reports that she quit smoking about 10 years ago. Her smoking use included cigarettes. She has a 100.00 pack-year smoking history. She has never used smokeless tobacco. She reports that she does not currently use alcohol. She reports that she does not use drugs.  Allergies:  Allergies  Allergen Reactions   Doxycycline Diarrhea   Gabapentin Other (See Comments)    sleepiness    Medications Prior to Admission  Medication Sig Dispense Refill   albuterol (PROVENTIL HFA;VENTOLIN HFA) 108 (90 BASE) MCG/ACT inhaler Inhale 2 puffs into the lungs every 6 (six) hours as needed for wheezing.     ARIPiprazole (ABILIFY) 10 MG tablet Take 10 mg by mouth daily.      aspirin EC 81 MG tablet Take 81 mg by mouth every morning.      Budeson-Glycopyrrol-Formoterol (BREZTRI AEROSPHERE) 160-9-4.8 MCG/ACT AERO Inhale 2 puffs into the lungs in the morning and at bedtime. 10.7 g 5   CADUET 10-20 MG tablet TAKE 1 TABLET BY MOUTH DAILY. (Patient taking differently: Take 1 tablet by mouth daily.) 90 tablet 2   Calcium Carb-Cholecalciferol 600-400 MG-UNIT TABS Take 1 tablet by mouth daily.     celecoxib (CELEBREX) 200 MG capsule Take 200 mg by mouth daily as needed for mild pain.      Cholecalciferol (VITAMIN D3) 2000 UNITS TABS Take 2,000 Units by mouth daily.      denosumab (PROLIA) 60 MG/ML SOSY injection Inject 60 mg into the skin every 6 (six) months.     dexlansoprazole (DEXILANT) 60 MG capsule Take 60 mg by mouth daily.     docusate sodium (COLACE) 100 MG capsule Take 100 mg by mouth daily as needed for mild constipation.     ferrous sulfate 325 (65 FE) MG tablet Take 325 mg by mouth daily.     furosemide (LASIX) 20 MG tablet Take 1 tablet (20 mg  total) by mouth daily. 30 tablet 3   hydrALAZINE (APRESOLINE) 25 MG tablet TAKE 1 TABLET BY MOUTH TWICE A DAY 270 tablet 3   ipratropium-albuterol (DUONEB) 0.5-2.5 (3) MG/3ML SOLN Take 3 mLs by nebulization in the morning and at bedtime. 360 mL 11   lamoTRIgine (LAMICTAL) 100 MG tablet Take 200 mg by mouth 2 (two) times daily.     montelukast (SINGULAIR) 10 MG tablet TAKE 1 TABLET BY MOUTH EVERYDAY AT BEDTIME (Patient taking differently: Take 10 mg by mouth  at bedtime.) 90 tablet 1   Multiple Vitamin (MULTIVITAMIN) capsule Take 1 capsule by mouth daily.      nitroGLYCERIN (NITROSTAT) 0.4 MG SL tablet Place 1 tablet (0.4 mg total) under the tongue every 5 (five) minutes as needed for chest pain. 25 tablet 3   Omega-3 Fatty Acids (FISH OIL) 1000 MG CAPS Take 1,000 mg by mouth 2 (two) times daily.     oxyCODONE-acetaminophen (PERCOCET) 10-325 MG per tablet Take 1 tablet by mouth every 4 (four) hours as needed for pain.      Polyethyl Glycol-Propyl Glycol (SYSTANE) 0.4-0.3 % GEL ophthalmic gel Place 1 application into both eyes daily as needed (dry eyes).     potassium chloride (KLOR-CON) 8 MEQ tablet Take 8 mEq by mouth daily.      sertraline (ZOLOFT) 50 MG tablet Take 50 mg by mouth daily.     tiZANidine (ZANAFLEX) 4 MG tablet Take 4 mg by mouth 4 (four) times daily as needed for muscle spasms.      valsartan (DIOVAN) 320 MG tablet TAKE 1 TABLET BY MOUTH EVERY DAY (Patient taking differently: Take 320 mg by mouth daily.) 90 tablet 2    Results for orders placed or performed during the hospital encounter of 08/28/20 (from the past 48 hour(s))  SARS Coronavirus 2 by RT PCR (hospital order, performed in [facility] hospital lab) Nasopharyngeal Nasopharyngeal Swab     Status: None   Collection Time: 08/28/20 11:01 AM   Specimen: Nasopharyngeal Swab  Result Value Ref Range   SARS Coronavirus 2 NEGATIVE NEGATIVE    Comment: (NOTE) SARS-CoV-2 target nucleic acids are NOT DETECTED.  The SARS-CoV-2 RNA  is generally detectable in upper and lower respiratory specimens during the acute phase of infection. The lowest concentration of SARS-CoV-2 viral copies this assay can detect is 250 copies / mL. A negative result does not preclude SARS-CoV-2 infection and should not be used as the sole basis for treatment or other patient management decisions.  A negative result may occur with improper specimen collection / handling, submission of specimen other than nasopharyngeal swab, presence of viral mutation(s) within the areas targeted by this assay, and inadequate number of viral copies (<250 copies / mL). A negative result must be combined with clinical observations, patient history, and epidemiological information.  Fact Sheet for Patients:   StrictlyIdeas.no  Fact Sheet for Healthcare Providers: BankingDealers.co.za  This test is not yet approved or  cleared by the [place] FDA and has been authorized for detection and/or diagnosis of SARS-CoV-2 by FDA under an Emergency Use Authorization (EUA).  This EUA will remain in effect (meaning this test can be used) for the duration of the COVID-19 declaration under Section 564(b)(1) of the Act, 21 U.S.C. section 360bbb-3(b)(1), unless the authorization is terminated or revoked sooner.  Performed at [facility], [place] [address], [place], [place] [address]   Prepare Pheresed Platelets     Status: None (Preliminary result)   Collection Time: 08/28/20 11:01 AM  Result Value Ref Range   Unit Number [id number]    Blood Component Type PLTP1 PSORALEN TREATED    Unit division 00    Status of Unit ISSUED    Transfusion Status      OK TO TRANSFUSE Performed at [facility] [address] [place], [place] [address]    Unit Number [id number]    Blood Component Type PLTP2 PSORALEN TREATED    Unit division 00    Status of Unit ISSUED  Transfusion Status OK TO TRANSFUSE    Surgical pcr screen     Status: None   Collection Time: 08/28/20 11:23 AM   Specimen: Nasal Mucosa; Nasal Swab  Result Value Ref Range   MRSA, PCR NEGATIVE NEGATIVE   Staphylococcus aureus NEGATIVE NEGATIVE    Comment: (NOTE) The Xpert SA Assay (FDA approved for NASAL specimens in patients 55 years of age and older), is one component of a comprehensive surveillance program. It is not intended to diagnose infection nor to guide or monitor treatment. Performed at [facility], [place] [address], [place], [place] [address]   Type and screen [place]     Status: None   Collection Time: 08/28/20 11:37 AM  Result Value Ref Range   ABO/RH(D) B POS    Antibody Screen NEG    Sample Expiration      08/31/2020,2359 Performed at [facility], [place] [address] [place], [place] [address]    No results found.  ROS  All positives and negatives are listed in HPI above  Blood pressure (!) 164/75, pulse 83, temperature 98.1 F (36.7 C), temperature source Oral, resp. rate 18, height '5\' 3"'$  (1.6 m), weight 77.1 kg, SpO2 (!) 89 %. Physical Exam  ANO x3, mild distress PERRLA, blepharospasm EOMI Bilateral upper extremities grip, triceps 3/5, biceps 3/5, deltoid 2/5 Sensory light touch decreased in the right hand greater than left hand Bilateral lower extremity 4/5 throughout  Assessment/Plan 75 year old female with  Cervical spondylotic myelopathy, C2-4 with severe stenosis Central cord syndrome  -C2-4 posterior laminectomy and instrumentation, possible extension to C5 today  -Discussed with the patient and her family and recommended urgent surgical decompression and instrumentation C2-4, possible extension of C5.  I extensively went over the risks, benefits and expected outcomes given her rapidly progressive myelopathy.  She understands that the goal of the surgery is to prevent further decline and hope for some recovery.  She was on 81 mg of aspirin however  given the rapid progression of her disease, we gave her platelet transfusion in order to perform her surgery today. -Her and her family agreed with surgical intervention.   Thank you for allowing me to participate in this patient's care.  Please do not hesitate to call with questions or concerns.   Elwin Sleight, [facility] Cell: [phone]

## 2020-08-28 NOTE — Progress Notes (Signed)
Pt arrived today initially for a Kyphoplasty with Dr Estanislado Pandy, I did see pt was scheduled at later time today for a surgical procedure. I contacted [facility] RN/ IR charge for update, surgical RN Minna Merritts came to see if the pt would be for [place] or surgery. After several calls of inquiry pt was taken to surgical short stay and per [facility] RN, the kyphoplasty would be cancelled and rescheduled at a later date.

## 2020-08-28 NOTE — Transfer of Care (Signed)
Immediate Anesthesia Transfer of Care Note  Patient: [name]  Procedure(s) Performed: Laminectomy and Foraminotomy - Cervical Two-Three, Cervical Three-Four, with lateral mass fusion/ fixation (Spine Cervical)  Patient Location: PACU  Anesthesia Type:General  Level of Consciousness: awake, alert , oriented and patient cooperative  Airway & Oxygen Therapy: Patient Spontanous Breathing and Patient connected to face mask oxygen  Post-op Assessment: Report given to RN, Post -op Vital signs reviewed and stable and Patient moving all extremities X 4  Post vital signs: Reviewed and stable  Last Vitals:  Vitals Value Taken Time  BP 175/76 08/28/20 1845  Temp 36.2 C 08/28/20 1845  Pulse 80 08/28/20 1853  Resp 16 08/28/20 1853  SpO2 95 % 08/28/20 1853  Vitals shown include unvalidated device data.  Last Pain:  Vitals:   08/28/20 1845  TempSrc:   PainSc: 3       Patients Stated Pain Goal: 2 ([id number])  Complications: No notable events documented.

## 2020-08-28 NOTE — Op Note (Signed)
Providing Compassionate, Quality Care - Together  Date of service 08/28/2020  PREOP DIAGNOSIS:  Cervical spondylotic myelopathy with severe stenosis C2-3, C3-4 Central cord syndrome  POSTOP DIAGNOSIS: Same  PROCEDURE: Posterior cervical arthrodesis, C2-3, C3-4, with lateral mass instrumentation; K2M [place] instrumentation Posterior cervical bilateral laminectomy C2, C3, C4 Intraoperative use of fluoroscopy, less than 1 hour Intraoperative use of allograft Intraoperative use of autograft, same incision Intraoperative use of neuro monitoring, SSEPs Intraoperative use of microscope, for microdissection  SURGEON: Dr. Pieter Partridge C. Lashayla Armes, DO  ASSISTANT: Dr. Granville Lewis, MD  ANESTHESIA: General Endotracheal  EBL: 100 cc  SPECIMENS: None  DRAINS: Medium Hemovac  COMPLICATIONS: None  CONDITION: Hemodynamically stable  HISTORY: [name] is a 75 y.o. female presented to the hospital on 08/25/2020 in the emergency department after multiple days of progressive myelopathy after a fall.  MRI showed severe stenosis.  She was seen in the office yesterday and noted to have continued significant progressive symptoms of myelopathy.  Given her severe stenosis at C2-3 and C3-4, we recommended urgent decompression and instrumentation.  Her main complaints included central cord type symptoms, with numbness and tingling in her bilateral hands, right greater than left.  She also had weakness in her grip bicep and tricep.  She also difficulty walking.  She normally uses a 4 wheeled walker in which she could hardly use at this point.  PROCEDURE IN DETAIL: The patient was brought to the operating room. After induction of general anesthesia, prepositional SSEPs were obtained and noted to be monitorable.  Then a Sugita head holder was placed and the patient was positioned on the operative table in the prone position. All pressure points were meticulously padded.  Post positional SSEPs were noted to be  stable.  Skin incision was then marked out and prepped and draped in the usual sterile fashion.  Using a 10 blade, midline incision was performed over the C2-4 spinous processes.  Using Bovie electrocautery, soft tissue dissection was performed in midline down to the cervical fascia.  Subperiosteal dissection was then performed along the C2, C3 and C4 lamina's bilaterally.  Deep retractors were placed in the wound.  The microscope was then sterilely draped and brought into the field for the remainder of the surgery.  Using a high-speed drill, bilateral troughs were created in the C2, C3 and C4 lamina's.  The C4 lamina was followed with a high-speed drill bilaterally approximately halfway down.  The epidural space was identified.  Once bilateral troughs were created and the lamina were freely floating, microcurette's and Kerrison rongeurs were used to disconnect the ligamentous attachments.  The spinous processes and lamina was then gently dissected and elevated off the thecal sac.  There was significant adherence of the calcified ligamentum flavum at C2 and C3.  This was carefully dissected with microcurette's.  Epidural space was identified and hemostasis was achieved with bipolar cautery and Surgifoam.  The thecal sac was pulsatile and noncompressed.  At this point, lateral fluoroscopy was brought into the field after being sterilely draped.  Lateral mass screws were placed in the bilateral C4 lateral masses and confirmed with lateral fluoroscopy to be in appropriate placement.  They were tapped with a 3-0 tap, felt with a feeler and had all bony borders and right a 14 mm screws placed in on the left a 12 mm screw was placed.  They had appropriate bony purchase.  Of note the lateral mass on the right of C3 was autofused to C4 and the lateral  masses at C3 were quite degenerative and small therefore instrumentation was not attempted in them.  Attention was then turned to C2 pars screws bilaterally.  Using a  Penfield 1, the bilateral C2 pedicles were identified and a cortical pilot hole was created inferior laterally on the lateral mass.  Using the 3-0 tap, a superior medial trajectory was used to approximately 16 mm.  Lateral fluoroscopy confirmed appropriate trajectory.  Using a feeler, the trajectories had all bony borders.  4.0 10m screws were selected and placed bilaterally.  Lateral fluoroscopy confirmed appropriate placement.  The lateral masses of C2, C3 and C4 were decorticated with a high-speed drill.  The previous lamina and spinous processes were cleaned of soft tissue and used as autograft.  This was mixed with allograft.  This was laid in the lateral gutters bilaterally.  Appropriate sized rods were then placed and setscrews were placed and final tightened to the manufacturer's recommendation.  Deep retractors were taken out of the wound, hemostasis was achieved with bipolar cautery.  A medium Hemovac was placed in the epidural space and tunneled inferiorly.  The wound was closed in layers, 0 Vicryl sutures for muscle and fascia.  2-0 and 3-0 Vicryl sutures for dermis.  Skin was closed with skin glue.  Sterile dressing was applied.  Throughout the entirety of the case, SSEPs were stable.  At the end of the case all sponge, needle, and instrument counts were correct. The patient was then transferred to the stretcher, Sugita head holder was removed and she was extubated, and taken to the post-anesthesia care unit in stable hemodynamic condition.

## 2020-08-28 NOTE — Anesthesia Preprocedure Evaluation (Addendum)
Anesthesia Evaluation  Patient identified by MRN, date of birth, ID band Patient awake    Reviewed: Allergy & Precautions, NPO status , Patient's Chart, lab work & pertinent test results  History of Anesthesia Complications Negative for: history of anesthetic complications  Airway Mallampati: II  TM Distance: >3 FB Neck ROM: Full    Dental  (+) Dental Advisory Given, Edentulous Upper, Partial Lower   Pulmonary COPD,  COPD inhaler, former smoker,    Pulmonary exam normal        Cardiovascular hypertension, Pt. on medications + CAD, + Past MI, + Cardiac Stents, + Peripheral Vascular Disease and +CHF  Normal cardiovascular exam   '22 Carotid US - >50% right ICAS, 1-39% left ICAS  '22 TTE - EF 55%. Basal inferior severe hypokinesis. There is mild left ventricular hypertrophy. Grade I diastolic dysfunction (impaired relaxation). Bi-atrial sizes were mildly dilated. Mild mitral valve regurgitation. Mild aortic valve sclerosis is present, with no evidence of aortic valve stenosis.     Neuro/Psych Seizures -, Well Controlled,  PSYCHIATRIC DISORDERS Anxiety Depression Bipolar Disorder    GI/Hepatic GERD  Medicated and Controlled,(+)     substance abuse  alcohol use,   Endo/Other   Obesity   Renal/GU negative Renal ROS Hx RCC s/p partial nephrectomy      Musculoskeletal  (+) Arthritis , narcotic dependent Scoliosis     Abdominal   Peds  Hematology negative hematology ROS (+)   Anesthesia Other Findings   Reproductive/Obstetrics                            Anesthesia Physical Anesthesia Plan  ASA: 3  Anesthesia Plan: General   Post-op Pain Management:    Induction: Intravenous  PONV Risk Score and Plan: 4 or greater and Treatment may vary due to age or medical condition, Ondansetron, Dexamethasone and Propofol infusion  Airway Management Planned: Oral ETT  Additional Equipment:  None  Intra-op Plan:   Post-operative Plan: Extubation in OR  Informed Consent: I have reviewed the patients History and Physical, chart, labs and discussed the procedure including the risks, benefits and alternatives for the proposed anesthesia with the patient or authorized representative who has indicated his/her understanding and acceptance.     Dental advisory given  Plan Discussed with: CRNA and Anesthesiologist  Anesthesia Plan Comments:        Anesthesia Quick Evaluation

## 2020-08-28 NOTE — Anesthesia Procedure Notes (Signed)
Procedure Name: Intubation Date/Time: 08/28/2020 4:04 PM Performed by: Rande Brunt, CRNA Pre-anesthesia Checklist: Patient identified, Emergency Drugs available, Suction available and Patient being monitored Patient Re-evaluated:Patient Re-evaluated prior to induction Oxygen Delivery Method: Circle System Utilized Preoxygenation: Pre-oxygenation with 100% oxygen Induction Type: IV induction Ventilation: Mask ventilation without difficulty Laryngoscope Size: Miller and 2 Grade View: Grade I Tube type: Oral Tube size: 7.0 mm Number of attempts: 1 Airway Equipment and Method: Stylet Placement Confirmation: ETT inserted through vocal cords under direct vision, positive ETCO2 and breath sounds checked- equal and bilateral Secured at: 20 cm Tube secured with: Tape Dental Injury: Teeth and Oropharynx as per pre-operative assessment

## 2020-08-29 LAB — PREPARE PLATELET PHERESIS
Unit division: 0
Unit division: 0

## 2020-08-29 LAB — URINE CULTURE: Culture: 60000 — AB

## 2020-08-29 LAB — BPAM PLATELET PHERESIS
Blood Product Expiration Date: 202208122359
Blood Product Expiration Date: 202208132359
ISSUE DATE / TIME: 202208121126
ISSUE DATE / TIME: 202208121126
Unit Type and Rh: 6200
Unit Type and Rh: 6200

## 2020-08-29 MED ORDER — POTASSIUM CHLORIDE CRYS ER 10 MEQ PO TBCR
10.0000 meq | EXTENDED_RELEASE_TABLET | Freq: Every day | ORAL | Status: DC
  Administered 2020-08-29 – 2020-09-02 (×5): 10 meq via ORAL
  Filled 2020-08-29 (×5): qty 1

## 2020-08-29 NOTE — Progress Notes (Signed)
Occupational Therapy Evaluation Patient Details Name: [name] MRN: [id number] DOB: 1945/06/11 Today's Date: 08/29/2020    History of Present Illness Pt presents  with C2-4 myelopathy and central cord syndrome, now s/p posterior C2-4  laminectomy, instrumentation and fusion on 8/12. PMH: MI, osteoperosis, history of vaginal cancer and renal cancer, HTN, facial palsy, alcohol use disorder.   Clinical Impression   PTA pt lives with her best friend [name] and is modified independent @ RW level. Clarified order for C-collar with Dr Reatha Armour. Pt able to remove when is bed and when eating. Eval significantly limited by 10/10 pain although pt premedicated. Pt requires up to Max A with ADL tasks due to apparent BUE neuropraxia and deficits listed below. At this time recommend rehab at [facility] to maximize functional level of independence to facilitate safe DC home. Will follow acutely.     Follow Up Recommendations  CIR    Equipment Recommendations  3 in 1 bedside commode    Recommendations for Other Services Rehab consult     Precautions / Restrictions Precautions Precautions: Cervical;Fall Precaution Booklet Issued: Yes (comment) Precaution Comments: Educated on cervical precuations, pt in Aspen collar. Watch O2 sats, pt on 4L via [place] Required Braces or Orthoses: Cervical Brace (Aspen collar) Cervical Brace: Hard collar (pt can remove when eating and when in bed - clarified with Dr Reatha Armour 8/13)      Mobility Bed Mobility Overal bed mobility: Needs Assistance Bed Mobility: Rolling;Sidelying to Sit Rolling: Mod assist Sidelying to sit: Mod assist       General bed mobility comments: significant pain with rolling and attempt at sitting EOB; pt begging this therapist to return to supine    Transfers                 General transfer comment: deferred due to pain and pt requesting to lie down    Balance Overall balance assessment: Needs assistance Sitting-balance support:  Feet supported Sitting balance-Leahy Scale: Poor                                     ADL either performed or assessed with clinical judgement   ADL Overall ADL's : Needs assistance/impaired Eating/Feeding: Minimal assistance;Sitting Eating/Feeding Details (indicate cue type and reason): difficult due to apin. will further assess; able to hold cup adn bring to mouth Grooming: Moderate assistance       Lower Body Bathing: Maximal assistance;Bed level   Upper Body Dressing : Maximal assistance   Lower Body Dressing: Total assistance;Bed level               Functional mobility during ADLs:  (only able to tolerate bed mobility his date due to pain)       Vision Baseline Vision/History: Wears glasses Wears Glasses: Reading only       Perception     Praxis Praxis Praxis tested?: Deficits    Pertinent Vitals/Pain Pain Assessment: 0-10 Pain Score: 10-Worst pain ever Pain Location: back and neck Pain Descriptors / Indicators: Discomfort;Operative site guarding;Grimacing;Moaning Pain Intervention(s): Premedicated before session;Repositioned     Hand Dominance Left   Extremity/Trunk Assessment Upper Extremity Assessment Upper Extremity Assessment: RUE deficits/detail;LUE deficits/detail RUE Deficits / Details: ROM assessment limited by pain; Appears weaker proximally; pt reports hx of R shoulder "issues"; able to complete hand to top of head/hand to mouth although minimally uncorrdinated. Pt reports her strength has improved - appears mildly neuropraxic  RUE Sensation:  ("tingling", but improved) LUE Deficits / Details: simlar to RUE       Cervical / Trunk Assessment Cervical / Trunk Assessment: Kyphotic;Other exceptions Cervical / Trunk Exceptions: hx scoliosis, s/p cervical fusion   Communication Communication Communication: No difficulties   Cognition Arousal/Alertness: Awake/alert Behavior During Therapy: WFL for tasks  assessed/performed Overall Cognitive Status: Within Functional Limits for tasks assessed                                 General Comments: slow processing - most likely related to meds   General Comments  desat @ 85 on 4L    Exercises Exercises: Other exercises Other Exercises Other Exercises: incentive spirometer x 7 - able to pull 700 ml   Shoulder Instructions      Home Living Family/patient expects to be discharged to:: Private residence Living Arrangements: Non-relatives/Friends Available Help at Discharge: Friend(s);Available 24 hours/day [name]) Type of Home: House Home Access: Stairs to enter [employer] of Steps: 1 Entrance Stairs-Rails: None Home Layout: One level     Bathroom Shower/Tub: Teacher, early years/pre: Handicapped height Bathroom Accessibility: Yes   Home Equipment: Environmental consultant - 4 wheels;Grab bars - tub/shower          Prior Functioning/Environment Level of Independence: Needs assistance  Gait / Transfers Assistance Needed: Pt uses rollator for home and community ambulation, has felt weaker recently PTA. ADL's / Homemaking Assistance Needed: [name] helps with cooking and supervises bathing            OT Problem List: Decreased strength;Decreased range of motion;Decreased activity tolerance;Impaired balance (sitting and/or standing);Decreased coordination;Decreased safety awareness;Decreased knowledge of use of DME or AE;Decreased knowledge of precautions;Cardiopulmonary status limiting activity;Impaired sensation;Impaired tone;Impaired UE functional use;Pain      OT Treatment/Interventions: Self-care/ADL training;Therapeutic exercise;Neuromuscular education;DME and/or AE instruction;Therapeutic activities;Patient/family education;Balance training    OT Goals(Current goals can be found in the care plan section) Acute Rehab OT Goals Patient Stated Goal: to reduce her pain OT Goal Formulation:  With patient Time For Goal Achievement: 09/12/20 Potential to Achieve Goals: Good  OT Frequency: Min 2X/week   Barriers to D/C:            Co-evaluation              AM-PAC OT "6 Clicks" Daily Activity     Outcome Measure Help from another person eating meals?: A Little Help from another person taking care of personal grooming?: A Lot Help from another person toileting, which includes using toliet, bedpan, or urinal?: A Lot Help from another person bathing (including washing, rinsing, drying)?: A Lot Help from another person to put on and taking off regular upper body clothing?: A Lot Help from another person to put on and taking off regular lower body clothing?: Total 6 Click Score: 12   End of Session Equipment Utilized During Treatment: Cervical collar Nurse Communication: Mobility status;Weight bearing status;Precautions  Activity Tolerance: Patient limited by pain Patient left: in bed;with call bell/phone within reach;with bed alarm set  OT Visit Diagnosis: Unsteadiness on feet (R26.81);Other abnormalities of gait and mobility (R26.89);Muscle weakness (generalized) (M62.81);History of falling (Z91.81);Pain Pain - part of body:  (neck/back)                Time: 1450-1520 OT Time Calculation (min): 30 min Charges:  OT General Charges $OT Visit: 1 Visit OT Evaluation $OT Eval Moderate Complexity: 1 Mod  OT Treatments $Self Care/Home Management : 8-22 mins  Maurie Boettcher, OT/L   Acute OT Clinical Specialist Acute Rehabilitation Services Pager [phone] Office [phone]   [facility] 08/29/2020, 3:48 PM

## 2020-08-29 NOTE — Progress Notes (Addendum)
Physical Therapy Evaluation Patient Details Name: [name] MRN: [id number] DOB: 07-May-1945 Today's Date: 08/29/2020   History of Present Illness  Pt presents  with C2-4 myelopathy and central cord syndrome, now s/p posterior C2-4  laminectomy, instrumentation and fusion on 8/12. PMH: MI, osteoperosis, history of vaginal cancer and renal cancer, HTN, facial palsy, alcohol use disorder.  Clinical Impression  Pt presents s/p the procedure above with the impairments noted below. Pt reported high level of pain at the start of session but had just received her pain medications immediately prior. Pt is up to min assist for transfers and ambulated 15 feet with min guard. Pt is able to use her left hand to drink soda but requires assistance to eat. Educated pt on cervical precautions, appropriate use of her aspen collar, and importance of walking during recovery. Pt demonstrated ability to press call button successfully. Recommending CIR-level therapies based on current level of functioning. We will continue to follow her acutely.     Follow Up Recommendations CIR    Equipment Recommendations  Rolling walker with 5" wheels;3in1 (PT)    Recommendations for Other Services       Precautions / Restrictions Precautions Precautions: Cervical;Fall Precaution Booklet Issued: Yes (comment) Precaution Comments: Educated on cervical precuations, pt in Aspen collar. Watch O2 sats, pt on 4L via [place] Required Braces or Orthoses: Cervical Brace (Aspen collar) Cervical Brace: Hard collar;At all times Restrictions Weight Bearing Restrictions: No      Mobility  Bed Mobility Overal bed mobility: Needs Assistance Bed Mobility: Rolling;Sidelying to Sit Rolling: Min assist Sidelying to sit: Min assist;Mod assist       General bed mobility comments: Pt required min assist for rolling for LE placement into knee flexion and hand-held assist from PT to acheive sidelying. Pt required min to mod assist for  sidelying to sit for trunk control and hip advancement via bed pad to EOB.    Transfers Overall transfer level: Needs assistance Equipment used: Rolling walker (2 wheeled) Transfers: Sit to/from Stand Sit to Stand: Min assist;Min guard         General transfer comment: Pt required min guard to min assist for propulsion off bed with multimodal cuing for hand placement on bed to pushoff. Min guard assist during standing.  Ambulation/Gait Ambulation/Gait assistance: Min guard Gait Distance (Feet): 15 Feet Assistive device: Rolling walker (2 wheeled) Gait Pattern/deviations: Step-through pattern;Decreased step length - right;Decreased step length - left;Decreased dorsiflexion - right;Decreased dorsiflexion - left Gait velocity: decreased   General Gait Details: Pt ambulated from EOB to door and returned to recliner at EOB. No overt LOB noted. Minor decrease in step length bilaterally as well as decreased DF bilaterally. Pt demonstrated high level of thoracic kyphosis, unsure if this is baseline. Pt did confirm history of scoliosis.  Stairs            Wheelchair Mobility    Modified Rankin (Stroke Patients Only)       Balance Overall balance assessment: Needs assistance Sitting-balance support: Feet supported Sitting balance-Leahy Scale: Poor Sitting balance - Comments: Pt held on to PT wrist upon sitting EOB, likely secondary to fear or pain rather than steadying. Postural control: Posterior lean;Left lateral lean Standing balance support: Bilateral upper extremity supported;During functional activity Standing balance-Leahy Scale: Poor Standing balance comment: Pt reliant on BUE on RW during static standing and functional mobility tasks.  Pertinent Vitals/Pain Pain Assessment: 0-10 Pain Score: 10-Worst pain ever Pain Location: back and neck Pain Descriptors / Indicators: Discomfort;Operative site guarding;Grimacing Pain  Intervention(s): Premedicated before session;Monitored during session;Limited activity within patient's tolerance;Repositioned    Home Living Family/patient expects to be discharged to:: Private residence Living Arrangements: Non-relatives/Friends Available Help at Discharge: Friend(s);Available 24 hours/day [name]) Type of Home: House Home Access: Stairs to enter Entrance Stairs-Rails: None Entrance Stairs-Number of Steps: 1 Home Layout: One level Home Equipment: Walker - 4 wheels;Grab bars - tub/shower      Prior Function Level of Independence: Needs assistance   Gait / Transfers Assistance Needed: Pt uses rollator for home and community ambulation, has felt weaker recently PTA.  ADL's / Homemaking Assistance Needed: [name] helps with cooking and supervises bathing        Hand Dominance   Dominant Hand: Left    Extremity/Trunk Assessment   Upper Extremity Assessment Upper Extremity Assessment: Defer to OT evaluation    Lower Extremity Assessment Lower Extremity Assessment: Generalized weakness    Cervical / Trunk Assessment Cervical / Trunk Assessment: Kyphotic;Other exceptions Cervical / Trunk Exceptions: hx scoliosis, s/p cervical fusion  Communication   Communication: No difficulties  Cognition Arousal/Alertness: Awake/alert Behavior During Therapy: WFL for tasks assessed/performed Overall Cognitive Status: Within Functional Limits for tasks assessed                                        General Comments General comments (skin integrity, edema, etc.): Pt on 4LO2 via [place]; SpO2 during session ranged 86-91%.    Exercises     Assessment/Plan    PT Assessment Patient needs continued PT services  PT Problem List Decreased strength;Decreased range of motion;Decreased activity tolerance;Decreased balance;Decreased mobility;Decreased coordination;Pain;Decreased knowledge of precautions       PT Treatment Interventions DME  instruction;Gait training;Functional mobility training;Therapeutic activities;Therapeutic exercise;Balance training;Patient/family education    PT Goals (Current goals can be found in the Care Plan section)  Acute Rehab PT Goals Patient Stated Goal: to reduce her pain PT Goal Formulation: With patient Time For Goal Achievement: 09/12/20 Potential to Achieve Goals: Good    Frequency Min 5X/week   Barriers to discharge Decreased caregiver support      Co-evaluation               AM-PAC PT "6 Clicks" Mobility  Outcome Measure Help needed turning from your back to your side while in a flat bed without using bedrails?: A Little Help needed moving from lying on your back to sitting on the side of a flat bed without using bedrails?: A Lot Help needed moving to and from a bed to a chair (including a wheelchair)?: A Little Help needed standing up from a chair using your arms (e.g., wheelchair or bedside chair)?: A Little Help needed to walk in hospital room?: A Little Help needed climbing 3-5 steps with a railing? : A Lot 6 Click Score: 16    End of Session Equipment Utilized During Treatment: Gait belt;Oxygen (4LO2 via [place]) Activity Tolerance: No increased pain Patient left: in chair;with call bell/phone within reach;with chair alarm set Nurse Communication: Mobility status PT Visit Diagnosis: Muscle weakness (generalized) (M62.81);Other abnormalities of gait and mobility (R26.89)    Time: [id number] PT Time Calculation (min) (ACUTE ONLY): 27 min   Charges:   PT Evaluation $PT Eval Moderate Complexity: 1 Mod PT Treatments $Therapeutic Activity: 8-22 mins  Dawayne Cirri, SPT  Dawayne Cirri 08/29/2020, 12:44 PM

## 2020-08-29 NOTE — Progress Notes (Signed)
Postop day 1.  Overall patient doing very well.  She has appropriate incisional pain.  Her preoperative numbness and weakness in both upper extremities is much improved.  She is having no wound issues.  She is planning to participate with therapy.  She is happy with her progress.  Motor examination very close to intact in both upper and lower extremities.  Sensor examination nonfocal.  Wound clean and dry.  Patient doing very well following posterior cervical decompression and fusion.

## 2020-08-29 NOTE — Anesthesia Postprocedure Evaluation (Signed)
Anesthesia Post Note  Patient: [name]  Procedure(s) Performed: Laminectomy and Foraminotomy - Cervical Two-Three, Cervical Three-Four, with lateral mass fusion/ fixation (Spine Cervical)     Patient location during evaluation: PACU Anesthesia Type: General Level of consciousness: awake and alert Pain management: pain level controlled Vital Signs Assessment: post-procedure vital signs reviewed and stable Respiratory status: spontaneous breathing, nonlabored ventilation, respiratory function stable and patient connected to nasal cannula oxygen Cardiovascular status: blood pressure returned to baseline and stable Postop Assessment: no apparent nausea or vomiting Anesthetic complications: no   No notable events documented.  Last Vitals:  Vitals:   08/28/20 2313 08/29/20 0317  BP: (!) 152/72 (!) 163/74  Pulse: 67 71  Resp: 15 14  Temp: 36.6 C   SpO2: 95% 94%    Last Pain:  Vitals:   08/29/20 0649  TempSrc:   PainSc: [place]

## 2020-08-30 LAB — CULTURE, BLOOD (SINGLE): Culture: NO GROWTH

## 2020-08-30 MED ORDER — DIAZEPAM 5 MG PO TABS
5.0000 mg | ORAL_TABLET | Freq: Four times a day (QID) | ORAL | Status: DC | PRN
  Administered 2020-08-30 – 2020-09-01 (×5): 5 mg via ORAL
  Filled 2020-08-30 (×5): qty 1

## 2020-08-30 NOTE — Progress Notes (Signed)
Inpatient Rehab Admissions:  Inpatient Rehab Consult received.  I met with patient and friend, [name] at the bedside for rehabilitation assessment and to discuss goals and expectations of an inpatient rehab admission.  Both acknowledged understanding of CIR goals and expectations. Both interested in pt pursuing CIR.  Will continue to follow.  Signed: Gayland Curry, [place], [place] Admissions Coordinator [phone]

## 2020-08-30 NOTE — Progress Notes (Signed)
Postop day 2.  Patient complains of neck and back pain.  Continues to have improved sensation and control of both hands and arms.  Lower extremities also feel better.  Patient slowly mobilizing with therapy.  She is afebrile.  Her vital signs are stable.  Urine output is good however the patient was able to void while in bed and required in and out catheterization.  She has normal sensation in her perineum.  Overall progressing well but slowly.  Continue efforts at pain control.  Patient likely would benefit from inpatient rehabilitation.  Plan to consult tomorrow.

## 2020-08-30 NOTE — Progress Notes (Signed)
Pt was assisted to bedside commode and voided 225 mL of clear, yellow urine. Prior to void, pt bladder scan showed 475, and post-void bladder scan residual showed 195 mL. This RN contacted Dr. Annette Stable to see if he wanted to place a foley, or keep helping pt up to void despite residual. He gave orders to leave the foley out.   Justice Rocher, RN

## 2020-08-30 NOTE — Progress Notes (Signed)
Inpatient Rehab Admissions Coordinator Note:   Per PT/OT recommendations, pt was screened for CIR candidacy by Gayland Curry, MS, CCC-SLP.  At this time we are recommending an inpatient rehab consult. AC will place consult order per protocol.  Please contact me with questions.    Gayland Curry, [place], CCC-SLP Admissions Coordinator [phone] 08/30/20 11:35 AM

## 2020-08-30 NOTE — Progress Notes (Signed)
Physical Therapy Treatment Patient Details Name: [name] MRN: [id number] DOB: 1945-11-25 Today's Date: 08/30/2020    History of Present Illness Pt presents  with C2-4 myelopathy and central cord syndrome, now s/p posterior C2-4  laminectomy, instrumentation and fusion on 8/12. PMH: MI, osteoperosis, history of vaginal cancer and renal cancer, HTN, facial palsy, alcohol use disorder.    PT Comments    Pt reporting increased fatigue and pain despite premedication; still willing to participate. She required increased levels of assist for mobility today. Worked on bed level exercises for strengthening/postural re-education and transfer training. Pt requiring moderate assist to stand x 3. Further ambulation deferred in setting of dizziness. BP 118/62 (77) sitting edge of bed, 104/57 (72) supine; RN notified. Will continue to progress mobility as tolerated.     Follow Up Recommendations  CIR     Equipment Recommendations  Rolling walker with 5" wheels;3in1 (PT)    Recommendations for Other Services       Precautions / Restrictions Precautions Precautions: Cervical;Fall Precaution Booklet Issued: Yes (comment) Precaution Comments: Educated on cervical precuations, pt in Aspen collar. Watch O2 sats, pt on 4L via [place] Required Braces or Orthoses: Cervical Brace (Aspen collar) Cervical Brace: Hard collar;At all times Restrictions Weight Bearing Restrictions: No    Mobility  Bed Mobility Overal bed mobility: Needs Assistance Bed Mobility: Rolling;Sidelying to Sit;Sit to Sidelying Rolling: Min assist Sidelying to sit: Mod assist     Sit to sidelying: Mod assist General bed mobility comments: MinA for rolling onto left side with use of bed pad to guide hips over, guidance to push from hand rails, modA for trunk management to upright. Assist for BLE's back into bed    Transfers Overall transfer level: Needs assistance Equipment used: Rolling walker (2 wheeled) Transfers: Sit  to/from Stand Sit to Stand: Min assist;Mod assist         General transfer comment: Pt requiring min-modA to rise from edge of bed x 3  Ambulation/Gait             General Gait Details: deferred today in setting of dizziness/fatigue/pain   Stairs             Wheelchair Mobility    Modified Rankin (Stroke Patients Only)       Balance Overall balance assessment: Needs assistance Sitting-balance support: Feet supported Sitting balance-Leahy Scale: Poor Sitting balance - Comments: left lateral and posterior lean, requiring minA Postural control: Posterior lean;Left lateral lean Standing balance support: Bilateral upper extremity supported;During functional activity Standing balance-Leahy Scale: Poor Standing balance comment: Pt reliant on BUE on RW during static standing                            Cognition Arousal/Alertness: Awake/alert Behavior During Therapy: WFL for tasks assessed/performed Overall Cognitive Status: Within Functional Limits for tasks assessed                                 General Comments: Questionable today due to medication      Exercises General Exercises - Lower Extremity Ankle Circles/Pumps: Both;20 reps;Supine Long Arc Quad: Both;5 reps;Seated Other Exercises Other Exercises: Scapular retractions x 5 Other Exercises: provided yellow theraputty along with handouts to review use. also 2nd hanout for fine motor activities.  pt. return demo of theraputty exercises with max encouragement.  stating various reasons it was difficult or unable to complete  but demonstrating good return demo once engaged and encouraged to attempt    General Comments        Pertinent Vitals/Pain Pain Assessment: 0-10 Pain Score: 10-Worst pain ever Pain Location: back and neck Pain Descriptors / Indicators: Discomfort;Operative site guarding;Grimacing Pain Intervention(s): Monitored during session;Limited activity within  patient's tolerance;Premedicated before session;Repositioned    Home Living   Living Arrangements: Non-relatives/Friends Available Help at Discharge: Friend(s);Available 24 hours/day Type of Home: House (pt lives in [place]) Home Access: Stairs to enter Entrance Stairs-Rails: None Home Layout: One level        Prior Function            PT Goals (current goals can now be found in the care plan section) Acute Rehab PT Goals Patient Stated Goal: to reduce her pain PT Goal Formulation: With patient Time For Goal Achievement: 09/12/20 Potential to Achieve Goals: Good Progress towards PT goals: Not progressing toward goals - comment    Frequency    Min 5X/week      PT Plan Current plan remains appropriate    Co-evaluation              AM-PAC PT "6 Clicks" Mobility   Outcome Measure  Help needed turning from your back to your side while in a flat bed without using bedrails?: A Little Help needed moving from lying on your back to sitting on the side of a flat bed without using bedrails?: A Lot Help needed moving to and from a bed to a chair (including a wheelchair)?: A Lot Help needed standing up from a chair using your arms (e.g., wheelchair or bedside chair)?: A Lot Help needed to walk in hospital room?: A Lot Help needed climbing 3-5 steps with a railing? : Total 6 Click Score: 12    End of Session Equipment Utilized During Treatment: Gait belt;Oxygen (4LO2 via [place]) Activity Tolerance: Patient limited by fatigue Patient left: with call bell/phone within reach;in bed;with bed alarm set Nurse Communication: Mobility status;Other (comment) (BP) PT Visit Diagnosis: Muscle weakness (generalized) (M62.81);Other abnormalities of gait and mobility (R26.89)     Time: 1430-1456 PT Time Calculation (min) (ACUTE ONLY): 26 min  Charges:  $Therapeutic Activity: 23-37 mins                    Wyona Almas, PT, DPT Acute Rehabilitation Services Pager  [phone] Office [phone]    Deno Etienne 08/30/2020, 3:45 PM

## 2020-08-30 NOTE — Progress Notes (Signed)
Urinary retention,bladder scan revealed  greater than 501.in and out performed obtained 875. Re-scan in 6 hours

## 2020-08-30 NOTE — Progress Notes (Signed)
Pt unable to void post foley removal this am. Bladder scanned at 356 - 500 mL removed via in and out cath.   Justice Rocher, RN

## 2020-08-30 NOTE — Progress Notes (Signed)
This RN notified Dr. Annette Stable of pt's inability to void since foley removal. He gave orders to place foley if she needs to be in and out cath'd again.   Justice Rocher, RN

## 2020-08-30 NOTE — Progress Notes (Signed)
Patient unable to void. Bladder scan performed, and a total of 377m of urine scanned. Straight  In and Out Cath performed and a total of 7562mof urine drained. Post in and out cath performed and a total of 18m53mf urine scanned.

## 2020-08-30 NOTE — PMR Pre-admission (Signed)
PMR Admission Coordinator Pre-Admission Assessment  Patient: [name] is an 75 y.o., female MRN: [id number] DOB: [birth year] Height: _0  (160 cm) Weight: 77.1 kg  Insurance Information HMO:     PPO:      PCP:      IPA:      80/20: yes     OTHER:  PRIMARY: Medicare A & B      Policy#: [id number]      Subscriber: patient CM Name:       Phone#:      Fax#:  Pre-Cert#:       Employer:  Benefits:  Phone #: verified eligibility online via [employer] on 08/30/20     Name:  Eff. Date: Part A & B effective 12/17/05     Deduct: $1,556      Out of Pocket Max: NA      Life Max: NA CIR: 100%      SNF: 100% days 1-20, 80% days 21-100 Outpatient: 80%     Co-Pay: 20% Home Health: 100%      Co-Pay:  DME: 80%     Co-Pay: 20% Providers: pt's choice SECONDARY:       Policy#:      Phone#:   Development worker, community:       Phone#:   The Therapist, art Information Summary" for patients in Inpatient Rehabilitation Facilities with attached "Privacy Act [place] Records" was provided and verbally reviewed with: Patient  Emergency Contact Information Contact Information     Name Relation Home Work Mobile   [place] [phone]  [phone]       Current Medical History  Patient Admitting Diagnosis: cervical myelopathy, s/p posterior C2-4 laminectomy, instrumentation and fusion  History of Present Illness: Pt is a 75 year old female with history of anxiety/depression./Bipolar disorder diastolic congestive heart failure, COPD quit smoking 10 years ago, alcohol use, history of left renal mass partial nephrectomy 2014 as well as radiation therapy, CAD with stenting maintained on low-dose aspirin, chronic back pain.  Per chart review lives with [name]/roommate.  1 level home one-step to entry.  Use a Rollator for home and community ambulation.  Roommate helps with some cooking and supervises bathing.  Presented 08/28/2020 after a recent ground-level fall with progressive weakness in  upper extremities and gait abnormality.  She is noted severe numbness in bilateral arms and hands right greater than left.  Admission chemistries unremarkable except glucose 105, hemoglobin 15.3, urine culture 60,000 E. coli/50,000 Aerococcus.  Cranial CT scan negative.  X-rays and imaging revealed cervical spondylitic myelopathy with severe stenosis C2-3, 3-4/central cord syndrome.  Underwent posterior cervical arthrodesis C2-3, 3-4 with lateral mass instrumentation posterior cervical bilateral laminectomy 08/28/2020 per Dr. Reatha Armour.  Cervical collar as directed patient can remove when eating and when in bed.  She was cleared to begin subcutaneous heparin for DVT prophylaxis 09/01/2020.  Close monitoring of oxygen needs with increased exertion.  Tolerating a regular diet.  Therapy evaluations completed and CIR recommended d/t pt decreased functional mobility.    Patient's medical record from [facility] has been reviewed by the rehabilitation admission coordinator and physician.  Past Medical History  Past Medical History:  Diagnosis Date   Alcoholism ([place])    recovering since 2000   Anxiety and depression    Arthritis BACK   Bipolar disorder ([place])    Blepharospasm LEFT EYE   CHF exacerbation ([place]) 02/24/2017   Chronic back pain    COPD (chronic obstructive pulmonary disease) (HCC)    Coronary  artery disease CARDIOLOGIST- DR [place]--- LAST VISIT NOTE 09-07-2009  W/ CHART   Emphysema    GERD (gastroesophageal reflux disease)    History of alcohol abuse RECOVERING SINCE 2000   History of Left renal mass 03/02/2012   underwent partial nephrectomy   HTN (hypertension)    Idiopathic acute facial nerve palsy LEFT SIDE--  BOTOX THERAPY   Inferior MI ([place]) 2000--  POST PTCA W/ STENT X1   Osteoporosis    Other and unspecified general anesthetics causing adverse effect in therapeutic use post op delirium--  last anes record w/ chart  from   09-22-2009 (spinal w/ light sedation)   Peripheral  vascular disease ([place]) POST RIGHT CAROTID SURG.  1995   Renal cell carcinoma ([place]) 07/09/12   Left mass   Rosacea LEFT FACIAL RASH   S/P radiation therapy 02/21/2012   38.75 Gy HDR 5 Fractions- vaginal cuff   Scoliosis    Seizures (HCC)    x 1 after abrupt discontinuation of  Clonidine   Status post carotid endarterectomy RIGHT --  1995   Status post primary angioplasty with coronary stent 2000--  POST INFERIOR MI   Unstable balance WALKS W/ CANE   Vaginal cancer ([place])     Family History   family history includes Cancer in her maternal grandfather; Hypertension in her father, mother, and sister.  Prior Rehab/Hospitalizations Has the patient had prior rehab or hospitalizations prior to admission? Yes  Has the patient had major surgery during 100 days prior to admission? Yes   Current Medications  Current Facility-Administered Medications:    0.9 %  sodium chloride infusion (Manually program via Guardrails IV Fluids), , Intravenous, Once, Dawley, Troy C, DO   0.9 %  sodium chloride infusion, 250 mL, Intravenous, Continuous, Dawley, Troy C, DO, Last Rate: 1 mL/hr at 08/28/20 2200, 250 mL at 08/28/20 2200   0.9 %  sodium chloride infusion, , Intravenous, Continuous, Dawley, Troy C, DO, Last Rate: 50 mL/hr at 08/29/20 1710, New Bag at 08/29/20 1710   acetaminophen (TYLENOL) tablet 650 mg, 650 mg, Oral, Q4H PRN, 650 mg at 08/31/20 0155 **OR** acetaminophen (TYLENOL) suppository 650 mg, 650 mg, Rectal, Q4H PRN, Dawley, Troy C, DO   amLODipine (NORVASC) tablet 10 mg, 10 mg, Oral, Daily, 10 mg at 09/02/20 1005 **AND** atorvastatin (LIPITOR) tablet 20 mg, 20 mg, Oral, Daily, Dawley, Troy C, DO, 20 mg at 09/02/20 1005   ARIPiprazole (ABILIFY) tablet 10 mg, 10 mg, Oral, Daily, Dawley, Troy C, DO, 10 mg at 09/02/20 1006   6 CHG cloth bath night before surgery, , , Once **AND** [COMPLETED] 6 CHG cloth bath AM of surgery, , , Once **AND** Chlorhexidine Gluconate Cloth 2 % PADS 6 each, 6 each, Topical,  Once **AND** Chlorhexidine Gluconate Cloth 2 % PADS 6 each, 6 each, Topical, Once, Dawley, Troy C, DO   docusate sodium (COLACE) capsule 100 mg, 100 mg, Oral, Daily PRN, Dawley, Troy C, DO, 100 mg at 09/01/20 0930   fluticasone furoate-vilanterol (BREO ELLIPTA) 200-25 MCG/INH 1 puff, 1 puff, Inhalation, Daily, Dawley, Troy C, DO, 1 puff at 09/02/20 0857   furosemide (LASIX) tablet 20 mg, 20 mg, Oral, Daily, Dawley, Troy C, DO, 20 mg at 09/02/20 1005   heparin injection 5,000 Units, 5,000 Units, Subcutaneous, Q8H, Dawley, Troy C, DO, 5,000 Units at 09/02/20 6195   hydrALAZINE (APRESOLINE) tablet 25 mg, 25 mg, Oral, BID, Dawley, Troy C, DO, 25 mg at 09/02/20 1005   HYDROmorphone (DILAUDID) injection 0.5 mg, 0.5  mg, Intravenous, Q6H PRN, Dawley, Troy C, DO, 0.5 mg at 09/02/20 0251   ipratropium-albuterol (DUONEB) 0.5-2.5 (3) MG/3ML nebulizer solution 3 mL, 3 mL, Nebulization, Q6H PRN, Dawley, Troy C, DO   irbesartan (AVAPRO) tablet 300 mg, 300 mg, Oral, Daily, Dawley, Troy C, DO, 300 mg at 09/02/20 1005   lactated ringers infusion, , Intravenous, Continuous, Dawley, Troy C, DO, Stopped at 08/28/20 1818   lamoTRIgine (LAMICTAL) tablet 200 mg, 200 mg, Oral, BID, Dawley, Troy C, DO, 200 mg at 09/02/20 1005   menthol-cetylpyridinium (CEPACOL) lozenge 3 mg, 1 lozenge, Oral, PRN **OR** phenol (CHLORASEPTIC) mouth spray 1 spray, 1 spray, Mouth/Throat, PRN, Dawley, Troy C, DO   methocarbamol (ROBAXIN) 500 mg in dextrose 5 % 50 mL IVPB, 500 mg, Intravenous, Q6H PRN, Dawley, Troy C, DO   montelukast (SINGULAIR) tablet 10 mg, 10 mg, Oral, QHS, Dawley, Troy C, DO, 10 mg at 09/01/20 2256   nitroGLYCERIN (NITROSTAT) SL tablet 0.4 mg, 0.4 mg, Sublingual, Q5 min PRN, Dawley, Troy C, DO   ondansetron (ZOFRAN) tablet 4 mg, 4 mg, Oral, Q6H PRN, 4 mg at 08/31/20 0155 **OR** ondansetron (ZOFRAN) injection 4 mg, 4 mg, Intravenous, Q6H PRN, Dawley, Troy C, DO   oxyCODONE (Oxy IR/ROXICODONE) immediate release tablet 10 mg, 10  mg, Oral, Q4H PRN, Dawley, Troy C, DO, 10 mg at 09/02/20 1005   pantoprazole (PROTONIX) EC tablet 40 mg, 40 mg, Oral, Daily, Dawley, Troy C, DO, 40 mg at 09/02/20 1005   potassium chloride (KLOR-CON) CR tablet 10 mEq, 10 mEq, Oral, Daily, Dawley, Troy C, DO, 10 mEq at 09/02/20 1005   sertraline (ZOLOFT) tablet 50 mg, 50 mg, Oral, Daily, Dawley, Troy C, DO, 50 mg at 09/02/20 1005   sodium chloride flush (NS) 0.9 % injection 3 mL, 3 mL, Intravenous, Q12H, Dawley, Troy C, DO, 3 mL at 09/01/20 2257   sodium chloride flush (NS) 0.9 % injection 3 mL, 3 mL, Intravenous, PRN, Dawley, Troy C, DO   umeclidinium bromide (INCRUSE ELLIPTA) 62.5 MCG/INH 1 puff, 1 puff, Inhalation, Daily, Dawley, Troy C, DO, 1 puff at 09/02/20 0857  Patients Current Diet:  Diet Order             Diet regular Room service appropriate? Yes; Fluid consistency: Thin  Diet effective now                   Precautions / Restrictions Precautions Precautions: Cervical, Fall Precaution Booklet Issued: Yes (comment) Precaution Comments: watch O2 sats Cervical Brace: Hard collar, At all times Restrictions Weight Bearing Restrictions: No   Has the patient had 2 or more falls or a fall with injury in the past year? Yes  Prior Activity Level Community (5-7x/wk): gets out of house 6 days/week  Prior Functional Level Self Care: Did the patient need help bathing, dressing, using the toilet or eating? Needed some help  Indoor Mobility: Did the patient need assistance with walking from room to room (with or without device)? Independent  Stairs: Did the patient need assistance with internal or external stairs (with or without device)? Independent with steps at house. Does not utilize steps in community  Functional Cognition: Did the patient need help planning regular tasks such as shopping or remembering to take medications? [place] / Equipment Home Assistive Devices/Equipment: Environmental consultant (specify  type), Dentures (specify type), Eyeglasses, Blood pressure cuff Home Equipment: Walker - 4 wheels, Grab bars - tub/shower  Prior Device Use: Indicate devices/aids used by the patient prior  to current illness, exacerbation or injury? Walker  Current Functional Level Cognition  Overall Cognitive Status: Impaired/Different from baseline Current Attention Level: Sustained Orientation Level: Oriented X4 General Comments: Question if pain medication affecting cognition today. Pt repeatedly stating "help me," throughout session, although would not specify with what. Reporting back pain, premedicated prior to session.    Extremity Assessment (includes Sensation/Coordination)  Upper Extremity Assessment: RUE deficits/detail, LUE deficits/detail RUE Deficits / Details: ROM assessment limited by pain; Appears weaker proximally; pt reports hx of R shoulder "issues"; able to comlete hand to top of head/hand to mouth although minimally uncorrdinated. Pt reports her strength has improved - appears mildly neuropraxic RUE Sensation:  ("tingling", but improved) LUE Deficits / Details: simlar to RUE  Lower Extremity Assessment: Generalized weakness    ADLs  Overall ADL's : Needs assistance/impaired Eating/Feeding: Bed level, Minimal assistance Eating/Feeding Details (indicate cue type and reason): hob elevated. pt. required min a for opening containers but was able to open approx. 50% of the way before handing it and saying she could not finish doing it.  declined attempts to spread cream cheese. once bagel ready, max encouragement and pt. was able to self feed. also able to reach for cup and take sips through straw no spilliage or issues noted. Grooming: Moderate assistance Lower Body Bathing: Maximal assistance, Bed level Upper Body Dressing : Maximal assistance Lower Body Dressing: Total assistance, Bed level Functional mobility during ADLs:  (only able to tolerate bed mobility his date due to  pain) General ADL Comments: encouragement to initiate and engage in adl of self feeding.  verbalizes want and understanding to perform but required cues and coaxing to engage    Mobility  Overal bed mobility: Needs Assistance Bed Mobility: Rolling, Sidelying to Sit Rolling: Min assist Sidelying to sit: Mod assist Sit to sidelying: Mod assist General bed mobility comments: pt with decreased initiation, minA for rolling to right side, modA for trunk to upright sitting position    Transfers  Overall transfer level: Needs assistance Equipment used: Rolling walker (2 wheeled) Transfers: Sit to/from Stand, Stand Pivot Transfers Sit to Stand: +2 safety/equipment, Mod assist Stand pivot transfers: +2 safety/equipment, Mod assist General transfer comment: ModA to stand from edge of bed and pivot to [facility] (+2 safety); attempted to take steps away from [facility] once finished, however, pt with too much trunk flexion to complete, therefore, chair brought up behind her to sit down    Ambulation / Gait / Stairs / Wheelchair Mobility  Ambulation/Gait Ambulation/Gait assistance: Counsellor (Feet): 15 Feet Assistive device: Rolling walker (2 wheeled) Gait Pattern/deviations: Step-through pattern, Decreased step length - right, Decreased step length - left, Decreased dorsiflexion - right, Decreased dorsiflexion - left General Gait Details: Deferred today secondary to pain Gait velocity: decreased    Posture / Balance Dynamic Sitting Balance Sitting balance - Comments: Pt demonstrating left lateral and posterior lean, requiring minA Balance Overall balance assessment: Needs assistance Sitting-balance support: Feet supported, Bilateral upper extremity supported Sitting balance-Leahy Scale: Poor Sitting balance - Comments: Pt demonstrating left lateral and posterior lean, requiring minA Postural control: Left lateral lean, Posterior lean Standing balance support: Bilateral upper extremity  supported, During functional activity Standing balance-Leahy Scale: Poor Standing balance comment: Pt reliant on BUE on RW during static standing    Special needs/care consideration Continuous Drip IV  lactated ringers infusion; 0.9% sodium chloride infusion, Oxygen 6L O2 via nasal cannula, Skin Surgical incision: cervical, and Designated visitor [place]  Environment (from acute therapy documentation) Living Arrangements: Non-relatives/Friends  Lives With: Friend(s) Available Help at Discharge: Friend(s), Available 24 hours/day Type of Home: House (pt lives in [place]) Home Layout: One level Home Access: Stairs to enter Entrance Stairs-Rails: None Technical brewer of Steps: 1-2 Bathroom Shower/Tub: Chiropodist: Handicapped height Bathroom Accessibility: Yes How Accessible: Accessible via walker [place]: No  Discharge Living Setting Plans for Discharge Living Setting: Patient's home Type of Home at Discharge: House (townhouse) Discharge Home Layout: One level Discharge Home Access: Stairs to enter Entrance Stairs-Rails: None Entrance Stairs-Number of Steps: 1-2 Discharge Bathroom Shower/Tub: Tub/shower unit Discharge Bathroom Toilet: Handicapped height Discharge Bathroom Accessibility: Yes How Accessible: Accessible via walker Does the patient have any problems obtaining your medications?: No  Social/Family/Support Systems Anticipated Caregiver: [name], friend Anticipated Caregiver's Contact Information: [phone] Caregiver Availability: 24/7 Discharge Plan Discussed with Primary Caregiver: Yes Is Caregiver In Agreement with Plan?: Yes Does Caregiver/Family have Issues with Lodging/Transportation while Pt is in Rehab?: No  Goals Patient/Family Goal for Rehab: Supervision-Min A: PT/OT Expected length of stay: 21-24 days Pt/Family Agrees to Admission and willing to participate: Yes Program Orientation  Provided & Reviewed with Pt/Caregiver Including Roles  & Responsibilities: Yes  Decrease burden of Care through IP rehab admission: NA  Possible need for SNF placement upon discharge: Not anticipated  Patient Condition: I have reviewed medical records from [facility], spoken with CM, and patient. I met with patient at the bedside for inpatient rehabilitation assessment.  Patient will benefit from ongoing PT and OT, can actively participate in 3 hours of therapy a day 5 days of the week, and can make measurable gains during the admission.  Patient will also benefit from the coordinated team approach during an Inpatient Acute Rehabilitation admission.  The patient will receive intensive therapy as well as Rehabilitation physician, nursing, social worker, and care management interventions.  Due to safety, skin/wound care, disease management, medication administration, pain management, and patient education the patient requires 24 hour a day rehabilitation nursing.  The patient is currently Min-Mod A with mobility and Max-Total A with basic ADLs.  Discharge setting and therapy post discharge at home with home health is anticipated.  Patient has agreed to participate in the Acute Inpatient Rehabilitation Program and will admit today.  Preadmission Screen Completed By:  Bethel Born, 09/02/2020 11:10 AM ______________________________________________________________________   Discussed status with Dr. Posey Pronto on 09/02/20  at 11:10 AM and received approval for admission today.  Admission Coordinator:  Bethel Born, CCC-SLP, time 11:10 AM/Date 09/02/20    Assessment/Plan: Diagnosis: cervical myelopathy, s/p posterior C2-4 laminectomy, instrumentation and fusion Does the need for close, 24 hr/day Medical supervision in concert with the patient's rehab needs make it unreasonable for this patient to be served in a less intensive setting? Yes Co-Morbidities requiring  supervision/potential complications: anxiety/depression./Bipolar disorder diastolic congestive heart failure (Monitor in accordance with increased physical activity and avoid UE resistance excercises), COPD quit smoking 10 years ago, alcohol use, history of left renal mass partial nephrectomy 2014 as well as radiation therapy, CAD with stenting maintained on low-dose aspirin, chronic back pain (Biofeedback training with therapies to help reduce reliance on opiate pain medications, monitor pain control during therapies, and sedation at rest and titrate to maximum efficacy to ensure participation and gains in therapies) Due to bladder management, bowel management, safety, skin/wound care, pain management, and patient education, does the patient require 24 hr/day rehab nursing? Yes Does the patient require  coordinated care of a physician, rehab nurse, PT, OT to address physical and functional deficits in the context of the above medical diagnosis(es)? Yes Addressing deficits in the following areas: balance, endurance, locomotion, strength, transferring, bathing, dressing, toileting, and psychosocial support Can the patient actively participate in an intensive therapy program of at least 3 hrs of therapy 5 days a week? Yes The potential for patient to make measurable gains while on inpatient rehab is excellent Anticipated functional outcomes upon discharge from inpatient rehab: supervision and min assist PT, supervision and min assist OT, n/a SLP Estimated rehab length of stay to reach the above functional goals is: 12-17 days. Anticipated discharge destination: Home 10. Overall Rehab/Functional Prognosis: good   MD Signature: Delice Lesch, MD, ABPMR

## 2020-08-30 NOTE — Progress Notes (Signed)
Occupational Therapy Treatment Patient Details Name: [name] MRN: [id number] DOB: [birth year] Today's Date: 08/30/2020    History of present illness Pt presents  with C2-4 myelopathy and central cord syndrome, now s/p posterior C2-4  laminectomy, instrumentation and fusion on 8/12. PMH: MI, osteoperosis, history of vaginal cancer and renal cancer, HTN, facial palsy, alcohol use disorder.   OT comments  Pt. Seen for skilled OT session.  Session focused on self feeding and completion of fine motor exercises and activities.  Pt. Min a for self feeding bed level (hob elevated).  Good return demo of theraputty exercises.  Encouragement required throughout for completion of tasks but making gains.    Follow Up Recommendations  CIR    Equipment Recommendations  3 in 1 bedside commode    Recommendations for Other Services Rehab consult    Precautions / Restrictions Precautions Precautions: Cervical;Fall Precaution Comments: Educated on cervical precuations, pt in Aspen collar. Watch O2 sats, pt on 4L via [place] Required Braces or Orthoses: Cervical Brace Cervical Brace: Hard collar (previous doc. from [facility] states can be taken off when eating or in bed)       Mobility Bed Mobility Overal bed mobility: Needs Assistance             General bed mobility comments: total a x2 to pull up in bed an reposition    Transfers                      Balance                                           ADL either performed or assessed with clinical judgement   ADL Overall ADL's : Needs assistance/impaired Eating/Feeding: Bed level;Minimal assistance Eating/Feeding Details (indicate cue type and reason): hob elevated. pt. required min a for opening containers but was able to open approx. 50% of the way before handing it and saying she could not finish doing it.  declined attempts to spread cream cheese. once bagel ready, max encouragement and pt. was able to self  feed. also able to reach for cup and take sips through straw no spilliage or issues noted.                                   General ADL Comments: encouragement to initiate and engage in adl of self feeding.  verbalizes want and understanding to perform but required cues and coaxing to engage     Vision       Perception     Praxis      Cognition Arousal/Alertness: Awake/alert Behavior During Therapy: WFL for tasks assessed/performed Overall Cognitive Status: Within Functional Limits for tasks assessed                                          Exercises Other Exercises Other Exercises: provided yellow theraputty along with handouts to review use. also 2nd hanout for fine motor activities.  pt. return demo of theraputty exercises with max encouragement.  stating various reasons it was difficult or unable to complete but demonstrating good return demo once engaged and encouraged to attempt   Shoulder Instructions  General Comments      Pertinent Vitals/ Pain       Pain Assessment: 0-10 Pain Score: 10-Worst pain ever Pain Location: back and neck Pain Descriptors / Indicators: Discomfort;Operative site guarding;Grimacing;Moaning Pain Intervention(s): Limited activity within patient's tolerance;Monitored during session;Repositioned  Home Living                                          Prior Functioning/Environment              Frequency  Min 2X/week        Progress Toward Goals  OT Goals(current goals can now be found in the care plan section)  Progress towards OT goals: Progressing toward goals     Plan Discharge plan remains appropriate    Co-evaluation                 AM-PAC OT "6 Clicks" Daily Activity     Outcome Measure   Help from another person eating meals?: A Little Help from another person taking care of personal grooming?: A Lot Help from another person toileting, which includes  using toliet, bedpan, or urinal?: A Lot Help from another person bathing (including washing, rinsing, drying)?: A Lot Help from another person to put on and taking off regular upper body clothing?: A Lot Help from another person to put on and taking off regular lower body clothing?: Total 6 Click Score: 12    End of Session Equipment Utilized During Treatment: Cervical collar  OT Visit Diagnosis: Unsteadiness on feet (R26.81);Other abnormalities of gait and mobility (R26.89);Muscle weakness (generalized) (M62.81);History of falling (Z91.81);Pain   Activity Tolerance Patient limited by pain   Patient Left in bed;with call bell/phone within reach;with bed alarm set;with family/visitor present   Nurse Communication Other (comment) (rn present and assisted with pulling pt. up in bed)        Time: [id number] OT Time Calculation (min): 28 min  Charges: OT General Charges $OT Visit: 1 Visit OT Treatments $Self Care/Home Management : 8-22 mins $Therapeutic Exercise: 8-22 mins  Sonia Baller, COTA/L Acute Rehabilitation [phone]    Tanya Nones 08/30/2020, 12:26 PM

## 2020-08-31 ENCOUNTER — Encounter (HOSPITAL_COMMUNITY): Payer: Self-pay | Admitting: Neurological Surgery

## 2020-08-31 ENCOUNTER — Other Ambulatory Visit: Payer: Medicare Other

## 2020-08-31 NOTE — Progress Notes (Signed)
Hemovac removed from posterior neck. Pt tolerated well

## 2020-08-31 NOTE — Progress Notes (Signed)
Subjective: Patient reports some neck pain and moderate back pain as well as decreased ability to move her LUE since surgery. No acute events overnight.   Objective: Vital signs in last 24 hours: Temp:  [98.2 F (36.8 C)-98.4 F (36.9 C)] 98.4 F (36.9 C) (08/15 0430) Pulse Rate:  [82-87] 82 (08/14 1559) Resp:  [13-15] 13 (08/14 2232) BP: (104-131)/(51-63) 131/51 (08/15 0430) SpO2:  [92 %-96 %] 96 % (08/15 0809)  Intake/Output from previous day: 08/14 0701 - 08/15 0700 In: 600 [I.V.:600] Out: 2395 [Urine:2370; Drains:25] Intake/Output this shift: No intake/output data recorded.  Physical Exam: Patient is awake, A/O X 4, conversant, and in good spirits. They are in NAD and VSS. Doing well. Speech is fluent and appropriate. 4+/5 BLE  BUE delt 3/5, bi/tri/grip 4/5. PERLA, EOMI. CNs grossly intact. Hard cervical collar in place. Dressing is clean dry intact. Incision is well approximated with no drainage, erythema, or edema. Drain in place with minimal output.    Lab Results: No results for input(s): WBC, HGB, HCT, PLT in the last 72 hours. BMET No results for input(s): NA, K, CL, CO2, GLUCOSE, BUN, CREATININE, CALCIUM in the last 72 hours.  Studies/Results: No results found.  Assessment/Plan: 75 y.o. female with cervical spondylotic myelopathy, C2-4 stenosis, and central cord syndrome who is s/p C2-4 open laminectomy, instrumentation and fusion on 08/28/2020. She has had issues with urinary retention, although her output is at an acceptable level at this time. She has appropriate incisional pain and neck/upper back pain. She is recovering well. Continue hard cervical collar.  Drain will be removed this afternoon.  Continue working on pain control, mobility and ambulating patient. Will plan for discharge to CIR once medically ready.     LOS: 3 days     Marvis Moeller, DNP, NP-C 08/31/2020, 9:09 AM

## 2020-08-31 NOTE — Progress Notes (Signed)
Physical Therapy Treatment Patient Details Name: [name] MRN: [id number] DOB: 07-05-1945 Today's Date: 08/31/2020    History of Present Illness Pt presents  with C2-4 myelopathy and central cord syndrome, now s/p posterior C2-4  laminectomy, instrumentation and fusion on 8/12. PMH: MI, osteoperosis, history of vaginal cancer and renal cancer, HTN, facial palsy, alcohol use disorder.    PT Comments    Pt is progressing toward goals; reporting increased pain despite premedication before session; however, agreed to participate in therapy. Reinforced cervical precautions and the importance of sitting upright. Pt required min-mod assist +2 for bed mobility and stand pivot transfers without report of dizziness. Discharge recommendations remain appropriate. We will continue to work with her to promote independence with functional mobility.   Follow Up Recommendations  CIR     Equipment Recommendations  Rolling walker with 5" wheels;3in1 (PT)    Recommendations for Other Services       Precautions / Restrictions Precautions Precautions: Cervical;Fall Precaution Booklet Issued: Yes (comment) Precaution Comments: Educated on cervical precuations, pt in Aspen collar. Watch O2 sats, pt on 4L via [place] Required Braces or Orthoses: Cervical Brace (Aspen Collar, at all times) Cervical Brace: Hard collar;At all times Restrictions Weight Bearing Restrictions: No    Mobility  Bed Mobility Overal bed mobility: Needs Assistance Bed Mobility: Rolling;Sidelying to Sit Rolling: Min assist;Mod assist;+2 for physical assistance Sidelying to sit: Min assist;Mod assist;+2 for physical assistance       General bed mobility comments: Pt was min-mod assistance +2 for rolling onto right side with use of bed pad to guide hips over; pt required multimodal cuing for hand placement/pushing and LE advancement off bed; min-mod assistance for trunk righting.    Transfers Overall transfer level: Needs  assistance Equipment used: Rolling walker (2 wheeled) Transfers: Sit to/from [facility] Sit to Stand: Min assist;+2 safety/equipment Stand pivot transfers: Min assist;+2 safety/equipment;Mod assist       General transfer comment: Pt required min assist +2 and two attempts to stand from EOB with verbal cues for upright posture. Pt required min-mod  assist + 2 for stand pivot transfer to recliner and required assist for controlled descent.  Ambulation/Gait             General Gait Details: Deferred today secondary to pain   Stairs             Wheelchair Mobility    Modified Rankin (Stroke Patients Only)       Balance Overall balance assessment: Needs assistance Sitting-balance support: Feet supported;Bilateral upper extremity supported Sitting balance-Leahy Scale: Poor Sitting balance - Comments: Pt utilized BUE on PT to maintain upright sitting posture; also demonstrated left lateral and posterior lean requiring min A Postural control: Left lateral lean;Posterior lean Standing balance support: Bilateral upper extremity supported;During functional activity Standing balance-Leahy Scale: Poor Standing balance comment: Pt reliant on BUE on RW during static standing                            Cognition Arousal/Alertness: Awake/alert Behavior During Therapy: Anxious Overall Cognitive Status: Within Functional Limits for tasks assessed                                 General Comments: Pt perseverating on pain level      Exercises Other Exercises Other Exercises: Scapular retractions x 5 Other Exercises: Pt demonstrated theraputty exercises provided  by OT; rolling with BUE, pulling with UE stabilization (three-jaw chuck pinch grip), and rolling between hands. Pt reported pain during exercises; educated patient on utilizing putting several times while watching TV.    General Comments General comments (skin integrity, edema,  etc.): Pt on 4LO2 via [place]. Post-transfer, pt's SpO2 dropped to 87%; pt directed in pursed lip breathing exercise and SpO2 returned to 92%.      Pertinent Vitals/Pain Pain Assessment: 0-10 Pain Score: 10-Worst pain ever Pain Location: back and neck, headache Pain Descriptors / Indicators: Discomfort;Operative site guarding;Grimacing;Headache Pain Intervention(s): Monitored during session;Premedicated before session;Repositioned (Pt given pain meds approximately 51mn prior to session)    Home Living                      Prior Function            PT Goals (current goals can now be found in the care plan section) Acute Rehab PT Goals Patient Stated Goal: to reduce her pain PT Goal Formulation: With patient Time For Goal Achievement: 09/12/20 Potential to Achieve Goals: Good Progress towards PT goals: Progressing toward goals    Frequency    Min 5X/week      PT Plan Current plan remains appropriate    Co-evaluation              AM-PAC PT "6 Clicks" Mobility   Outcome Measure  Help needed turning from your back to your side while in a flat bed without using bedrails?: A Little Help needed moving from lying on your back to sitting on the side of a flat bed without using bedrails?: A Lot Help needed moving to and from a bed to a chair (including a wheelchair)?: A Lot Help needed standing up from a chair using your arms (e.g., wheelchair or bedside chair)?: A Lot Help needed to walk in hospital room?: A Lot Help needed climbing 3-5 steps with a railing? : Total 6 Click Score: 12    End of Session Equipment Utilized During Treatment: Gait belt;Oxygen (4LO2 via [place]) Activity Tolerance: Patient limited by pain;Patient limited by fatigue Patient left: in chair;with call bell/phone within reach;with chair alarm set Nurse Communication: Mobility status;Other (comment) (Use of STEDY encouraged during transfer back to bed) PT Visit Diagnosis: Muscle weakness  (generalized) (M62.81);Other abnormalities of gait and mobility (R26.89);Pain Pain - part of body:  (neck)     Time: [id number] Time Calculation (min) (ACUTE ONLY): 29 min  Charges:  $Therapeutic Activity: 23-37 mins                     LDawayne Cirri SPT LDawayne Cirri8/15/2022, 4:51 PM

## 2020-08-31 NOTE — Care Management Important Message (Signed)
Important Message  Patient Details  Name: [name] MRN: [id number] Date of Birth: 09/23/1945   Medicare Important Message Given:  Yes     Sael Furches Montine Circle 08/31/2020, 4:39 PM

## 2020-09-01 MED ORDER — HEPARIN SODIUM (PORCINE) 5000 UNIT/ML IJ SOLN
5000.0000 [IU] | Freq: Three times a day (TID) | INTRAMUSCULAR | Status: DC
  Administered 2020-09-01 – 2020-09-02 (×3): 5000 [IU] via SUBCUTANEOUS
  Filled 2020-09-01 (×3): qty 1

## 2020-09-01 MED ORDER — DIAZEPAM 2 MG PO TABS
2.0000 mg | ORAL_TABLET | Freq: Four times a day (QID) | ORAL | Status: DC | PRN
  Filled 2020-09-01: qty 1

## 2020-09-01 MED ORDER — METHOCARBAMOL 1000 MG/10ML IJ SOLN
500.0000 mg | Freq: Four times a day (QID) | INTRAVENOUS | Status: DC | PRN
  Filled 2020-09-01 (×2): qty 5

## 2020-09-01 MED ORDER — OXYCODONE HCL 5 MG PO TABS
10.0000 mg | ORAL_TABLET | ORAL | Status: DC | PRN
  Administered 2020-09-01 – 2020-09-02 (×5): 10 mg via ORAL
  Filled 2020-09-01 (×5): qty 2

## 2020-09-01 MED ORDER — HYDROMORPHONE HCL 1 MG/ML IJ SOLN
0.5000 mg | Freq: Four times a day (QID) | INTRAMUSCULAR | Status: DC | PRN
  Administered 2020-09-01 – 2020-09-02 (×2): 0.5 mg via INTRAVENOUS
  Filled 2020-09-01 (×2): qty 1

## 2020-09-01 NOTE — Progress Notes (Signed)
0900 RN bladder scanned pt, 482cc, pt got up to [facility] and encouraged to urinate. Pt urinated 50cc, PVR scan was 376cc, pt denies abdominal tenderness, urgency to urinate, WCTM and inform MD when they round on patient.

## 2020-09-01 NOTE — Progress Notes (Signed)
Physical Therapy Treatment Patient Details Name: DECLYNN STANDEFER MRN: [id number] DOB: [birth year] Today's Date: 09/01/2020    History of Present Illness Pt presents  with C2-4 myelopathy and central cord syndrome, now s/p posterior C2-4  laminectomy, instrumentation and fusion on 8/12. PMH: MI, osteoperosis, history of vaginal cancer and renal cancer, HTN, facial palsy, alcohol use disorder.    PT Comments    Pt remains limited by pain and fatigue. Session focused on bed mobility and transfer training. Pt requiring mod assist (+2 safety) for stand pivot transfers to bedside commode and recliner. Attempted progression of gait, however, pt unable. SpO2 > 88% on 4L O2. Will continue to progress as tolerated.     Follow Up Recommendations  CIR     Equipment Recommendations  Rolling walker with 5" wheels;3in1 (PT)    Recommendations for Other Services       Precautions / Restrictions Precautions Precautions: Cervical;Fall Precaution Booklet Issued: Yes (comment) Precaution Comments: watch O2 sats Required Braces or Orthoses: Cervical Brace (Aspen Collar, at all times) Cervical Brace: Hard collar;At all times Restrictions Weight Bearing Restrictions: No    Mobility  Bed Mobility Overal bed mobility: Needs Assistance Bed Mobility: Rolling;Sidelying to Sit Rolling: Min assist Sidelying to sit: Mod assist       General bed mobility comments: pt with decreased initiation, minA for rolling to right side, modA for trunk to upright sitting position    Transfers Overall transfer level: Needs assistance Equipment used: Rolling walker (2 wheeled) Transfers: Sit to/from [facility] Sit to Stand: +2 safety/equipment;Mod assist Stand pivot transfers: +2 safety/equipment;Mod assist       General transfer comment: ModA to stand from edge of bed and pivot to [facility] (+2 safety); attempted to take steps away from [facility] once finished, however, pt with too much trunk flexion to  complete, therefore, chair brought up behind her to sit down  Ambulation/Gait                 Stairs             Wheelchair Mobility    Modified Rankin (Stroke Patients Only)       Balance Overall balance assessment: Needs assistance Sitting-balance support: Feet supported;Bilateral upper extremity supported Sitting balance-Leahy Scale: Poor Sitting balance - Comments: Pt demonstrating left lateral and posterior lean, requiring minA Postural control: Left lateral lean;Posterior lean Standing balance support: Bilateral upper extremity supported;During functional activity Standing balance-Leahy Scale: Poor Standing balance comment: Pt reliant on BUE on RW during static standing                            Cognition Arousal/Alertness: Awake/alert Behavior During Therapy: Anxious Overall Cognitive Status: Impaired/Different from baseline Area of Impairment: Attention;Problem solving                   Current Attention Level: Sustained         Problem Solving: Decreased initiation;Requires verbal cues General Comments: Question if pain medication affecting cognition today. Pt repeatedly stating "help me," throughout session, although would not specify with what. Reporting back pain, premedicated prior to session.      Exercises      General Comments        Pertinent Vitals/Pain Pain Assessment: Faces Faces Pain Scale: Hurts worst Pain Location: back Pain Descriptors / Indicators: Discomfort;Operative site guarding;Grimacing Pain Intervention(s): Monitored during session;Limited activity within patient's tolerance;Premedicated before session    Home Living  Prior Function            PT Goals (current goals can now be found in the care plan section) Acute Rehab PT Goals Patient Stated Goal: to reduce her pain PT Goal Formulation: With patient Time For Goal Achievement: 09/12/20 Potential to Achieve  Goals: Good    Frequency    Min 5X/week      PT Plan Current plan remains appropriate    Co-evaluation              AM-PAC PT "6 Clicks" Mobility   Outcome Measure  Help needed turning from your back to your side while in a flat bed without using bedrails?: A Little Help needed moving from lying on your back to sitting on the side of a flat bed without using bedrails?: A Lot Help needed moving to and from a bed to a chair (including a wheelchair)?: A Lot Help needed standing up from a chair using your arms (e.g., wheelchair or bedside chair)?: A Lot Help needed to walk in hospital room?: A Lot Help needed climbing 3-5 steps with a railing? : Total 6 Click Score: 12    End of Session Equipment Utilized During Treatment: Gait belt;Oxygen (4LO2 via [place]) Activity Tolerance: Patient limited by pain;Patient limited by fatigue Patient left: in chair;with call bell/phone within reach;with chair alarm set Nurse Communication: Mobility status PT Visit Diagnosis: Muscle weakness (generalized) (M62.81);Other abnormalities of gait and mobility (R26.89);Pain Pain - part of body:  (neck)     Time: [id number] PT Time Calculation (min) (ACUTE ONLY): 19 min  Charges:  $Therapeutic Activity: 8-22 mins                     Wyona Almas, PT, DPT Acute Rehabilitation Services Pager [phone] Office [phone]    Deno Etienne 09/01/2020, 4:28 PM

## 2020-09-01 NOTE — Progress Notes (Signed)
   Providing Compassionate, Quality Care - Together  NEUROSURGERY PROGRESS NOTE   S: No issues overnight. Patient drowsy this am, valium seems to make her sleepy  O: EXAM:  BP 121/62 (BP Location: Left Arm)   Pulse 83   Temp 98 F (36.7 C) (Oral)   Resp 12   Ht '5\' 3"'$  (1.6 m)   Wt 77.1 kg   SpO2 94%   BMI 30.11 kg/m   Sleepy PERRL  Speech fluent, appropriate, but falls asleep after moments Dressing c/d/i BUE delt 3/5, bi/tri/grip 4/5 BLE 4/5 SILT  ASSESSMENT:  75 y.o. female with   CSM, with severe stenosis C2-4 Central cord syndrome  S/p PCDF C2-4 on 08/28/2020  PLAN: - pt/ot, CIR pending -hmv removed yesterday -patient needs encouragement as she seems to have moments of discouragement with her progress -takes oxycodone '10mg'$  Q4 h at home, valium is making her somnolent therefore will dc valium and add robaxin prn -sqh for dvt ppx    Thank you for allowing me to participate in this patient's care.  Please do not hesitate to call with questions or concerns.   Elwin Sleight, [facility] Cell: [phone]

## 2020-09-01 NOTE — Progress Notes (Signed)
Inpatient Rehab Admissions Coordinator:   I have no beds available for this patient to admit to CIR today.  Will continue to follow for timing of potential admission pending bed availability.   Shann Medal, PT, DPT Admissions Coordinator [phone] 09/01/20  4:16 PM

## 2020-09-02 ENCOUNTER — Other Ambulatory Visit: Payer: Self-pay

## 2020-09-02 ENCOUNTER — Encounter (HOSPITAL_COMMUNITY): Payer: Self-pay | Admitting: Neurological Surgery

## 2020-09-02 ENCOUNTER — Inpatient Hospital Stay (HOSPITAL_COMMUNITY)
Admission: RE | Admit: 2020-09-02 | Discharge: 2020-09-18 | DRG: 560 | Disposition: A | Discharge status: Home Health | Payer: Medicare Other | Source: Intra-hospital | Attending: Physical Medicine and Rehabilitation | Admitting: Physical Medicine and Rehabilitation

## 2020-09-02 DIAGNOSIS — N319 Neuromuscular dysfunction of bladder, unspecified: Secondary | ICD-10-CM | POA: Diagnosis present

## 2020-09-02 DIAGNOSIS — M810 Age-related osteoporosis without current pathological fracture: Secondary | ICD-10-CM | POA: Diagnosis present

## 2020-09-02 DIAGNOSIS — Z419 Encounter for procedure for purposes other than remedying health state, unspecified: Secondary | ICD-10-CM

## 2020-09-02 DIAGNOSIS — S14129D Central cord syndrome at unspecified level of cervical spinal cord, subsequent encounter: Secondary | ICD-10-CM

## 2020-09-02 DIAGNOSIS — I5032 Chronic diastolic (congestive) heart failure: Secondary | ICD-10-CM | POA: Diagnosis present

## 2020-09-02 DIAGNOSIS — J449 Chronic obstructive pulmonary disease, unspecified: Secondary | ICD-10-CM | POA: Diagnosis present

## 2020-09-02 DIAGNOSIS — J439 Emphysema, unspecified: Secondary | ICD-10-CM | POA: Diagnosis present

## 2020-09-02 DIAGNOSIS — I252 Old myocardial infarction: Secondary | ICD-10-CM | POA: Diagnosis not present

## 2020-09-02 DIAGNOSIS — Z905 Acquired absence of kidney: Secondary | ICD-10-CM | POA: Diagnosis not present

## 2020-09-02 DIAGNOSIS — Z87891 Personal history of nicotine dependence: Secondary | ICD-10-CM | POA: Diagnosis not present

## 2020-09-02 DIAGNOSIS — Z8249 Family history of ischemic heart disease and other diseases of the circulatory system: Secondary | ICD-10-CM | POA: Diagnosis not present

## 2020-09-02 DIAGNOSIS — Z955 Presence of coronary angioplasty implant and graft: Secondary | ICD-10-CM | POA: Diagnosis not present

## 2020-09-02 DIAGNOSIS — J441 Chronic obstructive pulmonary disease with (acute) exacerbation: Secondary | ICD-10-CM | POA: Diagnosis present

## 2020-09-02 DIAGNOSIS — M549 Dorsalgia, unspecified: Secondary | ICD-10-CM | POA: Diagnosis present

## 2020-09-02 DIAGNOSIS — G8929 Other chronic pain: Secondary | ICD-10-CM | POA: Diagnosis present

## 2020-09-02 DIAGNOSIS — I110 Hypertensive heart disease with heart failure: Secondary | ICD-10-CM | POA: Diagnosis present

## 2020-09-02 DIAGNOSIS — G8918 Other acute postprocedural pain: Secondary | ICD-10-CM | POA: Diagnosis not present

## 2020-09-02 DIAGNOSIS — L89312 Pressure ulcer of right buttock, stage 2: Secondary | ICD-10-CM | POA: Diagnosis not present

## 2020-09-02 DIAGNOSIS — I739 Peripheral vascular disease, unspecified: Secondary | ICD-10-CM | POA: Diagnosis present

## 2020-09-02 DIAGNOSIS — M419 Scoliosis, unspecified: Secondary | ICD-10-CM | POA: Diagnosis present

## 2020-09-02 DIAGNOSIS — Z20822 Contact with and (suspected) exposure to covid-19: Secondary | ICD-10-CM | POA: Diagnosis not present

## 2020-09-02 DIAGNOSIS — Z886 Allergy status to analgesic agent status: Secondary | ICD-10-CM

## 2020-09-02 DIAGNOSIS — Z79899 Other long term (current) drug therapy: Secondary | ICD-10-CM

## 2020-09-02 DIAGNOSIS — M4802 Spinal stenosis, cervical region: Secondary | ICD-10-CM | POA: Diagnosis not present

## 2020-09-02 DIAGNOSIS — L8990 Pressure ulcer of unspecified site, unspecified stage: Secondary | ICD-10-CM | POA: Insufficient documentation

## 2020-09-02 DIAGNOSIS — L89322 Pressure ulcer of left buttock, stage 2: Secondary | ICD-10-CM | POA: Diagnosis not present

## 2020-09-02 DIAGNOSIS — I10 Essential (primary) hypertension: Secondary | ICD-10-CM

## 2020-09-02 DIAGNOSIS — K219 Gastro-esophageal reflux disease without esophagitis: Secondary | ICD-10-CM | POA: Diagnosis present

## 2020-09-02 DIAGNOSIS — G959 Disease of spinal cord, unspecified: Secondary | ICD-10-CM | POA: Diagnosis not present

## 2020-09-02 DIAGNOSIS — S14129S Central cord syndrome at unspecified level of cervical spinal cord, sequela: Secondary | ICD-10-CM | POA: Diagnosis not present

## 2020-09-02 DIAGNOSIS — S14129A Central cord syndrome at unspecified level of cervical spinal cord, initial encounter: Secondary | ICD-10-CM | POA: Diagnosis present

## 2020-09-02 DIAGNOSIS — Z809 Family history of malignant neoplasm, unspecified: Secondary | ICD-10-CM

## 2020-09-02 DIAGNOSIS — F1021 Alcohol dependence, in remission: Secondary | ICD-10-CM | POA: Diagnosis present

## 2020-09-02 DIAGNOSIS — Z4789 Encounter for other orthopedic aftercare: Secondary | ICD-10-CM | POA: Diagnosis present

## 2020-09-02 DIAGNOSIS — K592 Neurogenic bowel, not elsewhere classified: Secondary | ICD-10-CM | POA: Diagnosis present

## 2020-09-02 DIAGNOSIS — Z85528 Personal history of other malignant neoplasm of kidney: Secondary | ICD-10-CM

## 2020-09-02 DIAGNOSIS — Z96641 Presence of right artificial hip joint: Secondary | ICD-10-CM | POA: Diagnosis present

## 2020-09-02 DIAGNOSIS — I2510 Atherosclerotic heart disease of native coronary artery without angina pectoris: Secondary | ICD-10-CM | POA: Diagnosis present

## 2020-09-02 DIAGNOSIS — S14124D Central cord syndrome at C4 level of cervical spinal cord, subsequent encounter: Secondary | ICD-10-CM

## 2020-09-02 DIAGNOSIS — M7989 Other specified soft tissue disorders: Secondary | ICD-10-CM | POA: Diagnosis not present

## 2020-09-02 LAB — CREATININE, SERUM
Creatinine, Ser: 0.71 mg/dL (ref 0.44–1.00)
GFR, Estimated: >60 mL/min (ref >60)

## 2020-09-02 LAB — CBC
HCT: 37.5 % (ref 36.0–46.0)
Hemoglobin: 14 g/dL (ref 12.0–15.0)
MCH: 35.7 pg — ABNORMAL HIGH (ref 26.0–34.0)
MCHC: 37.3 g/dL — ABNORMAL HIGH (ref 30.0–36.0)
MCV: 95.7 fL (ref 80.0–100.0)
Platelets: 283 10*3/uL (ref 150–400)
RBC: 3.92 MIL/uL (ref 3.87–5.11)
RDW: 13.9 % (ref 11.5–15.5)
WBC: 11.5 10*3/uL — ABNORMAL HIGH (ref 4.0–10.5)
nRBC: 0 % (ref 0.0–0.2)

## 2020-09-02 MED ORDER — VITAMIN D 25 MCG (1000 UNIT) PO TABS
2000.0000 [IU] | ORAL_TABLET | Freq: Every day | ORAL | Status: DC
  Administered 2020-09-02 – 2020-09-18 (×17): 2000 [IU] via ORAL
  Filled 2020-09-02 (×17): qty 2

## 2020-09-02 MED ORDER — HEPARIN SODIUM (PORCINE) 5000 UNIT/ML IJ SOLN: 5000.0000 [IU] | Freq: Three times a day (TID) | INTRAMUSCULAR | Status: DC

## 2020-09-02 MED ORDER — MONTELUKAST SODIUM 10 MG PO TABS
10.0000 mg | ORAL_TABLET | Freq: Every day | ORAL | Status: DC
  Administered 2020-09-02 – 2020-09-17 (×16): 10 mg via ORAL
  Filled 2020-09-02 (×16): qty 1

## 2020-09-02 MED ORDER — DOCUSATE SODIUM 100 MG PO CAPS: 100.0000 mg | ORAL_CAPSULE | Freq: Every day | ORAL | Status: DC | PRN

## 2020-09-02 MED ORDER — NITROGLYCERIN 0.4 MG SL SUBL: 0.4000 mg | SUBLINGUAL_TABLET | SUBLINGUAL | Status: DC | PRN

## 2020-09-02 MED ORDER — OXYCODONE HCL 5 MG PO TABS
10.0000 mg | ORAL_TABLET | ORAL | Status: DC | PRN
  Administered 2020-09-02 – 2020-09-07 (×22): 10 mg via ORAL
  Filled 2020-09-02 (×24): qty 2

## 2020-09-02 MED ORDER — HYDRALAZINE HCL 25 MG PO TABS
25.0000 mg | ORAL_TABLET | Freq: Two times a day (BID) | ORAL | Status: DC
  Administered 2020-09-02 – 2020-09-06 (×8): 25 mg via ORAL
  Filled 2020-09-02 (×8): qty 1

## 2020-09-02 MED ORDER — SERTRALINE HCL 50 MG PO TABS
50.0000 mg | ORAL_TABLET | Freq: Every day | ORAL | Status: DC
  Administered 2020-09-03 – 2020-09-18 (×16): 50 mg via ORAL
  Filled 2020-09-02 (×17): qty 1

## 2020-09-02 MED ORDER — UMECLIDINIUM BROMIDE 62.5 MCG/INH IN AEPB
1.0000 | INHALATION_SPRAY | Freq: Every day | RESPIRATORY_TRACT | Status: DC
  Administered 2020-09-04 – 2020-09-17 (×14): 1 via RESPIRATORY_TRACT
  Filled 2020-09-02 (×2): qty 7

## 2020-09-02 MED ORDER — ASPIRIN EC 81 MG PO TBEC
81.0000 mg | DELAYED_RELEASE_TABLET | Freq: Every day | ORAL | Status: DC
  Administered 2020-09-02 – 2020-09-18 (×17): 81 mg via ORAL
  Filled 2020-09-02 (×18): qty 1

## 2020-09-02 MED ORDER — PANTOPRAZOLE SODIUM 40 MG PO TBEC
40.0000 mg | DELAYED_RELEASE_TABLET | Freq: Every day | ORAL | Status: DC
  Administered 2020-09-03 – 2020-09-18 (×16): 40 mg via ORAL
  Filled 2020-09-02 (×16): qty 1

## 2020-09-02 MED ORDER — METHOCARBAMOL 500 MG PO TABS
500.0000 mg | ORAL_TABLET | Freq: Four times a day (QID) | ORAL | Status: DC | PRN
  Administered 2020-09-02: 500 mg via ORAL
  Filled 2020-09-02: qty 1

## 2020-09-02 MED ORDER — ARIPIPRAZOLE 5 MG PO TABS
10.0000 mg | ORAL_TABLET | Freq: Every day | ORAL | Status: DC
  Administered 2020-09-03 – 2020-09-18 (×16): 10 mg via ORAL
  Filled 2020-09-02 (×16): qty 2

## 2020-09-02 MED ORDER — ONDANSETRON HCL 4 MG PO TABS
4.0000 mg | ORAL_TABLET | Freq: Four times a day (QID) | ORAL | Status: DC | PRN
Start: 2020-09-02 — End: 2020-09-18

## 2020-09-02 MED ORDER — ONDANSETRON HCL 4 MG/2ML IJ SOLN: 4.0000 mg | Freq: Four times a day (QID) | INTRAMUSCULAR | Status: DC | PRN

## 2020-09-02 MED ORDER — IPRATROPIUM-ALBUTEROL 0.5-2.5 (3) MG/3ML IN SOLN
3.0000 mL | Freq: Four times a day (QID) | RESPIRATORY_TRACT | Status: DC | PRN
  Administered 2020-09-14: 3 mL via RESPIRATORY_TRACT
  Filled 2020-09-02: qty 3

## 2020-09-02 MED ORDER — AMLODIPINE BESYLATE 10 MG PO TABS
10.0000 mg | ORAL_TABLET | Freq: Every day | ORAL | Status: DC
  Administered 2020-09-03 – 2020-09-14 (×12): 10 mg via ORAL
  Filled 2020-09-02 (×12): qty 1

## 2020-09-02 MED ORDER — IRBESARTAN 300 MG PO TABS
300.0000 mg | ORAL_TABLET | Freq: Every day | ORAL | Status: DC
  Administered 2020-09-03 – 2020-09-18 (×16): 300 mg via ORAL
  Filled 2020-09-02 (×16): qty 1

## 2020-09-02 MED ORDER — HEPARIN SODIUM (PORCINE) 5000 UNIT/ML IJ SOLN
5000.0000 [IU] | Freq: Three times a day (TID) | INTRAMUSCULAR | Status: DC
  Administered 2020-09-02 – 2020-09-10 (×24): 5000 [IU] via SUBCUTANEOUS
  Filled 2020-09-02 (×24): qty 1

## 2020-09-02 MED ORDER — ACETAMINOPHEN 650 MG RE SUPP
650.0000 mg | RECTAL | Status: DC | PRN
  Filled 2020-09-02: qty 1

## 2020-09-02 MED ORDER — FUROSEMIDE 20 MG PO TABS
20.0000 mg | ORAL_TABLET | Freq: Every day | ORAL | Status: DC
  Administered 2020-09-03 – 2020-09-18 (×16): 20 mg via ORAL
  Filled 2020-09-02 (×16): qty 1

## 2020-09-02 MED ORDER — ACETAMINOPHEN 325 MG PO TABS
650.0000 mg | ORAL_TABLET | ORAL | Status: DC | PRN
  Administered 2020-09-03 – 2020-09-15 (×37): 650 mg via ORAL
  Filled 2020-09-02 (×42): qty 2

## 2020-09-02 MED ORDER — POTASSIUM CHLORIDE CRYS ER 10 MEQ PO TBCR
10.0000 meq | EXTENDED_RELEASE_TABLET | Freq: Every day | ORAL | Status: DC
  Administered 2020-09-03 – 2020-09-18 (×16): 10 meq via ORAL
  Filled 2020-09-02 (×16): qty 1

## 2020-09-02 MED ORDER — FERROUS SULFATE 325 (65 FE) MG PO TABS
325.0000 mg | ORAL_TABLET | Freq: Every day | ORAL | Status: DC
  Administered 2020-09-03 – 2020-09-18 (×16): 325 mg via ORAL
  Filled 2020-09-02 (×16): qty 1

## 2020-09-02 MED ORDER — FLUTICASONE FUROATE-VILANTEROL 200-25 MCG/INH IN AEPB
1.0000 | INHALATION_SPRAY | Freq: Every day | RESPIRATORY_TRACT | Status: DC
  Administered 2020-09-04 – 2020-09-18 (×15): 1 via RESPIRATORY_TRACT
  Filled 2020-09-02: qty 28

## 2020-09-02 MED ORDER — LAMOTRIGINE 100 MG PO TABS
200.0000 mg | ORAL_TABLET | Freq: Two times a day (BID) | ORAL | Status: DC
  Administered 2020-09-02 – 2020-09-18 (×32): 200 mg via ORAL
  Filled 2020-09-02 (×32): qty 2

## 2020-09-02 MED ORDER — ATORVASTATIN CALCIUM 10 MG PO TABS
20.0000 mg | ORAL_TABLET | Freq: Every day | ORAL | Status: DC
  Administered 2020-09-03 – 2020-09-14 (×12): 20 mg via ORAL
  Filled 2020-09-02 (×12): qty 2

## 2020-09-02 NOTE — Progress Notes (Signed)
Subjective: Patient reports that she is having a moderate amount of back pain and is unable to get comfortable.   Objective: Vital signs in last 24 hours: Temp:  [97.7 F (36.5 C)-100.4 F (38 C)] 98.3 F (36.8 C) (08/17 0702) Pulse Rate:  [68-92] 84 (08/17 0858) Resp:  [13-20] 18 (08/17 0858) BP: (112-143)/(50-74) 125/54 (08/17 0702) SpO2:  [90 %-96 %] 91 % (08/17 0858)  Intake/Output from previous day: 08/16 0701 - 08/17 0700 In: 680 [P.O.:680] Out: 1426 [Urine:1426] Intake/Output this shift: No intake/output data recorded.  Physical Exam: Patient is awake, A/O X 4, conversant, and in good spirits. They are in NAD and VSS. Doing well. Speech is fluent and appropriate. 4+/5 BLE  BUE delt 3/5, bi/tri/grip 4/5. PERLA, EOMI. CNs grossly intact. Hard cervical collar in place. Dressing is clean dry intact. Incision is well approximated with no drainage, erythema, or edema. Drain in place with minimal output.     Lab Results: No results for input(s): WBC, HGB, HCT, PLT in the last 72 hours. BMET No results for input(s): NA, K, CL, CO2, GLUCOSE, BUN, CREATININE, CALCIUM in the last 72 hours.  Studies/Results: No results found.  Assessment/Plan: 75 y.o. female with cervical spondylotic myelopathy, C2-4 stenosis, and central cord syndrome who is s/p C2-4 open laminectomy, instrumentation and fusion on 08/28/2020. She has appropriate incisional pain and neck/upper back pain. She is recovering well. She also has complaints of mild back pain which is likely her compression fracture. She is less sleepy today than she has been, although she continues to be on the lethargic side. Continue to use PRN medications judicially. Continue hard cervical collar. Continue working on pain control, mobility and ambulating patient. Will plan for discharge to CIR once a bed becomes available.   LOS: 5 days     Marvis Moeller, DNP, NP-C 09/02/2020, 9:10 AM

## 2020-09-02 NOTE — Progress Notes (Signed)
Signed                                                                                                                                                                                                                                                                                                                                                                                                                                                                                                                    PMR Admission Coordinator Pre-Admission Assessment   Patient: [name] is an 75 y.o., female MRN: [id number] DOB: May 14, 1945 Height: $RemoveBefo'5\' 3"'[id number]$  (160 cm) Weight: 77.1 kg   Insurance Information HMO:     PPO:      PCP:      IPA:      80/20: yes     OTHER:  PRIMARY: Medicare A & B      Policy#: [id number]      Subscriber: patient CM Name:       Phone#:      Fax#:  Pre-Cert#:  Employer:  Benefits:  Phone #: verified eligibility online via [employer] on 08/30/20     Name:  Eff. Date: Part A & B effective 12/17/05     Deduct: $1,556      Out of Pocket Max: NA      Life Max: NA CIR: 100%      SNF: 100% days 1-20, 80% days 21-100 Outpatient: 80%     Co-Pay: 20% Home Health: 100%      Co-Pay:  DME: 80%     Co-Pay: 20% Providers: pt's choice SECONDARY:       Policy#:      Phone#:    Development worker, community:       Phone#:    The Therapist, art Information Summary" for patients in Inpatient Rehabilitation Facilities with attached "Privacy Act [place] Records" was provided and verbally reviewed with: Patient   Emergency Contact Information Contact Information       Name Relation Home Work Mobile    [place] [phone]   [phone]           Current Medical History  Patient Admitting Diagnosis: cervical  myelopathy, s/p posterior C2-4 laminectomy, instrumentation and fusion   History of Present Illness: Pt is a 75 year old female with history of anxiety/depression./Bipolar disorder diastolic congestive heart failure, COPD quit smoking 10 years ago, alcohol use, history of left renal mass partial nephrectomy 2014 as well as radiation therapy, CAD with stenting maintained on low-dose aspirin, chronic back pain.  Per chart review lives with [name]/roommate.  1 level home one-step to entry.  Use a Rollator for home and community ambulation.  Roommate helps with some cooking and supervises bathing.  Presented 08/28/2020 after a recent ground-level fall with progressive weakness in upper extremities and gait abnormality.  She is noted severe numbness in bilateral arms and hands right greater than left.  Admission chemistries unremarkable except glucose 105, hemoglobin 15.3, urine culture 60,000 E. coli/50,000 Aerococcus.  Cranial CT scan negative.  X-rays and imaging revealed cervical spondylitic myelopathy with severe stenosis C2-3, 3-4/central cord syndrome.  Underwent posterior cervical arthrodesis C2-3, 3-4 with lateral mass instrumentation posterior cervical bilateral laminectomy 08/28/2020 per Dr. Reatha Armour.  Cervical collar as directed patient can remove when eating and when in bed.  She was cleared to begin subcutaneous heparin for DVT prophylaxis 09/01/2020.  Close monitoring of oxygen needs with increased exertion.  Tolerating a regular diet.  Therapy evaluations completed and CIR recommended d/t pt decreased functional mobility.   Patient[facility] has been reviewed by the rehabilitation admission coordinator and physician.   Past Medical History      Past Medical History:  Diagnosis Date   Alcoholism ([place])      recovering since 2000   Anxiety and depression     Arthritis BACK   Bipolar disorder ([place])     Blepharospasm LEFT EYE   CHF exacerbation ([place]) 02/24/2017    Chronic back pain     COPD (chronic obstructive pulmonary disease) (HCC)     Coronary artery disease CARDIOLOGIST- DR [place]--- LAST VISIT NOTE 09-07-2009  W/ CHART   Emphysema     GERD (gastroesophageal reflux disease)     History of alcohol abuse RECOVERING SINCE 2000   History of Left renal mass 03/02/2012    underwent partial nephrectomy   HTN (hypertension)     Idiopathic acute facial nerve palsy LEFT SIDE--  BOTOX THERAPY   Inferior MI ([place]) 2000--  POST PTCA W/ STENT X1  Osteoporosis     Other and unspecified general anesthetics causing adverse effect in therapeutic use post op delirium--  last anes record w/ chart  from   09-22-2009 (spinal w/ light sedation)   Peripheral vascular disease ([place]) POST RIGHT CAROTID SURG.  1995   Renal cell carcinoma ([place]) 07/09/12    Left mass   Rosacea LEFT FACIAL RASH   S/P radiation therapy 02/21/2012    38.75 Gy HDR 5 Fractions- vaginal cuff   Scoliosis     Seizures (HCC)      x 1 after abrupt discontinuation of  Clonidine   Status post carotid endarterectomy RIGHT --  1995   Status post primary angioplasty with coronary stent 2000--  POST INFERIOR MI   Unstable balance WALKS W/ CANE   Vaginal cancer ([place])        Family History   family history includes Cancer in her maternal grandfather; Hypertension in her father, mother, and sister.   Prior Rehab/Hospitalizations Has the patient had prior rehab or hospitalizations prior to admission? Yes   Has the patient had major surgery during 100 days prior to admission? Yes              Current Medications   Current Facility-Administered Medications:    0.9 %  sodium chloride infusion (Manually program via Guardrails IV Fluids), , Intravenous, Once, Dawley, Troy C, DO   0.9 %  sodium chloride infusion, 250 mL, Intravenous, Continuous, Dawley, Troy C, DO, Last Rate: 1 mL/hr at 08/28/20 2200, 250 mL at 08/28/20 2200   0.9 %  sodium chloride infusion, , Intravenous, Continuous, Dawley, Troy C,  DO, Last Rate: 50 mL/hr at 08/29/20 1710, New Bag at 08/29/20 1710   acetaminophen (TYLENOL) tablet 650 mg, 650 mg, Oral, Q4H PRN, 650 mg at 08/31/20 0155 **OR** acetaminophen (TYLENOL) suppository 650 mg, 650 mg, Rectal, Q4H PRN, Dawley, Troy C, DO   amLODipine (NORVASC) tablet 10 mg, 10 mg, Oral, Daily, 10 mg at 09/02/20 1005 **AND** atorvastatin (LIPITOR) tablet 20 mg, 20 mg, Oral, Daily, Dawley, Troy C, DO, 20 mg at 09/02/20 1005   ARIPiprazole (ABILIFY) tablet 10 mg, 10 mg, Oral, Daily, Dawley, Troy C, DO, 10 mg at 09/02/20 1006   6 CHG cloth bath night before surgery, , , Once **AND** [COMPLETED] 6 CHG cloth bath AM of surgery, , , Once **AND** Chlorhexidine Gluconate Cloth 2 % PADS 6 each, 6 each, Topical, Once **AND** Chlorhexidine Gluconate Cloth 2 % PADS 6 each, 6 each, Topical, Once, Dawley, Troy C, DO   docusate sodium (COLACE) capsule 100 mg, 100 mg, Oral, Daily PRN, Dawley, Troy C, DO, 100 mg at 09/01/20 0930   fluticasone furoate-vilanterol (BREO ELLIPTA) 200-25 MCG/INH 1 puff, 1 puff, Inhalation, Daily, Dawley, Troy C, DO, 1 puff at 09/02/20 0857   furosemide (LASIX) tablet 20 mg, 20 mg, Oral, Daily, Dawley, Troy C, DO, 20 mg at 09/02/20 1005   heparin injection 5,000 Units, 5,000 Units, Subcutaneous, Q8H, Dawley, Troy C, DO, 5,000 Units at 09/02/20 8828   hydrALAZINE (APRESOLINE) tablet 25 mg, 25 mg, Oral, BID, Dawley, Troy C, DO, 25 mg at 09/02/20 1005   HYDROmorphone (DILAUDID) injection 0.5 mg, 0.5 mg, Intravenous, Q6H PRN, Dawley, Troy C, DO, 0.5 mg at 09/02/20 0251   ipratropium-albuterol (DUONEB) 0.5-2.5 (3) MG/3ML nebulizer solution 3 mL, 3 mL, Nebulization, Q6H PRN, Dawley, Troy C, DO   irbesartan (AVAPRO) tablet 300 mg, 300 mg, Oral, Daily, Dawley, Troy C, DO, 300 mg at 09/02/20 1005  lactated ringers infusion, , Intravenous, Continuous, Dawley, Troy C, DO, Stopped at 08/28/20 1818   lamoTRIgine (LAMICTAL) tablet 200 mg, 200 mg, Oral, BID, Dawley, Troy C, DO, 200 mg at  09/02/20 1005   menthol-cetylpyridinium (CEPACOL) lozenge 3 mg, 1 lozenge, Oral, PRN **OR** phenol (CHLORASEPTIC) mouth spray 1 spray, 1 spray, Mouth/Throat, PRN, Dawley, Troy C, DO   methocarbamol (ROBAXIN) 500 mg in dextrose 5 % 50 mL IVPB, 500 mg, Intravenous, Q6H PRN, Dawley, Troy C, DO   montelukast (SINGULAIR) tablet 10 mg, 10 mg, Oral, QHS, Dawley, Troy C, DO, 10 mg at 09/01/20 2256   nitroGLYCERIN (NITROSTAT) SL tablet 0.4 mg, 0.4 mg, Sublingual, Q5 min PRN, Dawley, Troy C, DO   ondansetron (ZOFRAN) tablet 4 mg, 4 mg, Oral, Q6H PRN, 4 mg at 08/31/20 0155 **OR** ondansetron (ZOFRAN) injection 4 mg, 4 mg, Intravenous, Q6H PRN, Dawley, Troy C, DO   oxyCODONE (Oxy IR/ROXICODONE) immediate release tablet 10 mg, 10 mg, Oral, Q4H PRN, Dawley, Troy C, DO, 10 mg at 09/02/20 1005   pantoprazole (PROTONIX) EC tablet 40 mg, 40 mg, Oral, Daily, Dawley, Troy C, DO, 40 mg at 09/02/20 1005   potassium chloride (KLOR-CON) CR tablet 10 mEq, 10 mEq, Oral, Daily, Dawley, Troy C, DO, 10 mEq at 09/02/20 1005   sertraline (ZOLOFT) tablet 50 mg, 50 mg, Oral, Daily, Dawley, Troy C, DO, 50 mg at 09/02/20 1005   sodium chloride flush (NS) 0.9 % injection 3 mL, 3 mL, Intravenous, Q12H, Dawley, Troy C, DO, 3 mL at 09/01/20 2257   sodium chloride flush (NS) 0.9 % injection 3 mL, 3 mL, Intravenous, PRN, Dawley, Troy C, DO   umeclidinium bromide (INCRUSE ELLIPTA) 62.5 MCG/INH 1 puff, 1 puff, Inhalation, Daily, Dawley, Troy C, DO, 1 puff at 09/02/20 0857   Patients Current Diet:  Diet Order                  Diet regular Room service appropriate? Yes; Fluid consistency: Thin  Diet effective now                         Precautions / Restrictions Precautions Precautions: Cervical, Fall Precaution Booklet Issued: Yes (comment) Precaution Comments: watch O2 sats Cervical Brace: Hard collar, At all times Restrictions Weight Bearing Restrictions: No    Has the patient had 2 or more falls or a fall with injury in  the past year? Yes   Prior Activity Level Community (5-7x/wk): gets out of house 6 days/week   Prior Functional Level Self Care: Did the patient need help bathing, dressing, using the toilet or eating? Needed some help   Indoor Mobility: Did the patient need assistance with walking from room to room (with or without device)? Independent   Stairs: Did the patient need assistance with internal or external stairs (with or without device)? Independent with steps at house. Does not utilize steps in community   Functional Cognition: Did the patient need help planning regular tasks such as shopping or remembering to take medications? [place] / Equipment Home Assistive Devices/Equipment: Environmental consultant (specify type), Dentures (specify type), Eyeglasses, Blood pressure cuff Home Equipment: Walker - 4 wheels, Grab bars - tub/shower   Prior Device Use: Indicate devices/aids used by the patient prior to current illness, exacerbation or injury? Walker   Current Functional Level Cognition   Overall Cognitive Status: Impaired/Different from baseline Current Attention Level: Sustained Orientation Level: Oriented X4 General Comments: Question if pain medication  affecting cognition today. Pt repeatedly stating "help me," throughout session, although would not specify with what. Reporting back pain, premedicated prior to session.    Extremity Assessment (includes Sensation/Coordination)   Upper Extremity Assessment: RUE deficits/detail, LUE deficits/detail RUE Deficits / Details: ROM assessment limited by pain; Appears weaker proximally; pt reports hx of R shoulder "issues"; able to comlete hand to top of head/hand to mouth although minimally uncorrdinated. Pt reports her strength has improved - appears mildly neuropraxic RUE Sensation:  ("tingling", but improved) LUE Deficits / Details: simlar to RUE  Lower Extremity Assessment: Generalized weakness     ADLs   Overall ADL's :  Needs assistance/impaired Eating/Feeding: Bed level, Minimal assistance Eating/Feeding Details (indicate cue type and reason): hob elevated. pt. required min a for opening containers but was able to open approx. 50% of the way before handing it and saying she could not finish doing it.  declined attempts to spread cream cheese. once bagel ready, max encouragement and pt. was able to self feed. also able to reach for cup and take sips through straw no spilliage or issues noted. Grooming: Moderate assistance Lower Body Bathing: Maximal assistance, Bed level Upper Body Dressing : Maximal assistance Lower Body Dressing: Total assistance, Bed level Functional mobility during ADLs:  (only able to tolerate bed mobility his date due to pain) General ADL Comments: encouragement to initiate and engage in adl of self feeding.  verbalizes want and understanding to perform but required cues and coaxing to engage     Mobility   Overal bed mobility: Needs Assistance Bed Mobility: Rolling, Sidelying to Sit Rolling: Min assist Sidelying to sit: Mod assist Sit to sidelying: Mod assist General bed mobility comments: pt with decreased initiation, minA for rolling to right side, modA for trunk to upright sitting position     Transfers   Overall transfer level: Needs assistance Equipment used: Rolling walker (2 wheeled) Transfers: Sit to/from Stand, Stand Pivot Transfers Sit to Stand: +2 safety/equipment, Mod assist Stand pivot transfers: +2 safety/equipment, Mod assist General transfer comment: ModA to stand from edge of bed and pivot to [facility] (+2 safety); attempted to take steps away from [facility] once finished, however, pt with too much trunk flexion to complete, therefore, chair brought up behind her to sit down     Ambulation / Gait / Stairs / Wheelchair Mobility   Ambulation/Gait Ambulation/Gait assistance: Counsellor (Feet): 15 Feet Assistive device: Rolling walker (2 wheeled) Gait  Pattern/deviations: Step-through pattern, Decreased step length - right, Decreased step length - left, Decreased dorsiflexion - right, Decreased dorsiflexion - left General Gait Details: Deferred today secondary to pain Gait velocity: decreased     Posture / Balance Dynamic Sitting Balance Sitting balance - Comments: Pt demonstrating left lateral and posterior lean, requiring minA Balance Overall balance assessment: Needs assistance Sitting-balance support: Feet supported, Bilateral upper extremity supported Sitting balance-Leahy Scale: Poor Sitting balance - Comments: Pt demonstrating left lateral and posterior lean, requiring minA Postural control: Left lateral lean, Posterior lean Standing balance support: Bilateral upper extremity supported, During functional activity Standing balance-Leahy Scale: Poor Standing balance comment: Pt reliant on BUE on RW during static standing     Special needs/care consideration Continuous Drip IV  lactated ringers infusion; 0.9% sodium chloride infusion, Oxygen 6L O2 via nasal cannula, Skin Surgical incision: cervical, and Designated visitor [place] (from acute therapy documentation) Living Arrangements: Non-relatives/Friends  Lives With: Friend(s) Available Help at Discharge: Friend(s), Available 24 hours/day Type  of Home: House (pt lives in [place]) Home Layout: One level Home Access: Stairs to enter Entrance Stairs-Rails: None Technical brewer of Steps: 1-2 Bathroom Shower/Tub: Chiropodist: Handicapped height Bathroom Accessibility: Yes How Accessible: Accessible via walker [place]: No   Discharge Living Setting Plans for Discharge Living Setting: Patient's home Type of Home at Discharge: House (townhouse) Discharge Home Layout: One level Discharge Home Access: Stairs to enter Entrance Stairs-Rails: None Entrance Stairs-Number of Steps: 1-2 Discharge Bathroom  Shower/Tub: Tub/shower unit Discharge Bathroom Toilet: Handicapped height Discharge Bathroom Accessibility: Yes How Accessible: Accessible via walker Does the patient have any problems obtaining your medications?: No   Social/Family/Support Systems Anticipated Caregiver: [name], friend Anticipated Caregiver's Contact Information: [phone] Caregiver Availability: 24/7 Discharge Plan Discussed with Primary Caregiver: Yes Is Caregiver In Agreement with Plan?: Yes Does Caregiver/Family have Issues with Lodging/Transportation while Pt is in Rehab?: No   Goals Patient/Family Goal for Rehab: Supervision-Min A: PT/OT Expected length of stay: 21-24 days Pt/Family Agrees to Admission and willing to participate: Yes Program Orientation Provided & Reviewed with Pt/Caregiver Including Roles  & Responsibilities: Yes   Decrease burden of Care through IP rehab admission: NA   Possible need for SNF placement upon discharge: Not anticipated   Patient Condition: I have reviewed medical records from [facility], spoken with CM, and patient. I met with patient at the bedside for inpatient rehabilitation assessment.  Patient will benefit from ongoing PT and OT, can actively participate in 3 hours of therapy a day 5 days of the week, and can make measurable gains during the admission.  Patient will also benefit from the coordinated team approach during an Inpatient Acute Rehabilitation admission.  The patient will receive intensive therapy as well as Rehabilitation physician, nursing, social worker, and care management interventions.  Due to safety, skin/wound care, disease management, medication administration, pain management, and patient education the patient requires 24 hour a day rehabilitation nursing.  The patient is currently Min-Mod A with mobility and Max-Total A with basic ADLs.  Discharge setting and therapy post discharge at home with home health is anticipated.  Patient has agreed to  participate in the Acute Inpatient Rehabilitation Program and will admit today.   Preadmission Screen Completed By:  Bethel Born, 09/02/2020 11:10 AM ______________________________________________________________________   Discussed status with Dr. Posey Pronto on 09/02/20  at 11:10 AM and received approval for admission today.   Admission Coordinator:  Bethel Born, CCC-SLP, time 11:10 AM/Date 09/02/20     Assessment/Plan: Diagnosis: cervical myelopathy, s/p posterior C2-4 laminectomy, instrumentation and fusion Does the need for close, 24 hr/day Medical supervision in concert with the patient's rehab needs make it unreasonable for this patient to be served in a less intensive setting? Yes Co-Morbidities requiring supervision/potential complications: anxiety/depression./Bipolar disorder diastolic congestive heart failure (Monitor in accordance with increased physical activity and avoid UE resistance excercises), COPD quit smoking 10 years ago, alcohol use, history of left renal mass partial nephrectomy 2014 as well as radiation therapy, CAD with stenting maintained on low-dose aspirin, chronic back pain (Biofeedback training with therapies to help reduce reliance on opiate pain medications, monitor pain control during therapies, and sedation at rest and titrate to maximum efficacy to ensure participation and gains in therapies) Due to bladder management, bowel management, safety, skin/wound care, pain management, and patient education, does the patient require 24 hr/day rehab nursing? Yes Does the patient require coordinated care of a physician, rehab nurse, PT, OT to address physical  and functional deficits in the context of the above medical diagnosis(es)? Yes Addressing deficits in the following areas: balance, endurance, locomotion, strength, transferring, bathing, dressing, toileting, and psychosocial support Can the patient actively participate in an intensive therapy program of  at least 3 hrs of therapy 5 days a week? Yes The potential for patient to make measurable gains while on inpatient rehab is excellent Anticipated functional outcomes upon discharge from inpatient rehab: supervision and min assist PT, supervision and min assist OT, n/a SLP Estimated rehab length of stay to reach the above functional goals is: 12-17 days. Anticipated discharge destination: Home 10. Overall Rehab/Functional Prognosis: good     MD Signature: Delice Lesch, MD, ABPMR

## 2020-09-02 NOTE — H&P (Signed)
Physical Medicine and Rehabilitation Admission H&P     HPI: [name]. Thayne is a 75 year old right-handed female with history of anxiety/depression./Bipolar disorder diastolic congestive heart failure, COPD quit smoking 10 years ago, alcohol use, history of left renal mass partial nephrectomy 2014 as well as radiation therapy, CAD with stenting maintained on low-dose aspirin, chronic back pain.  History taken from chart review, and patient.  Patient lives with [name]/roommate.  1 level home one-step to entry.  Use a Rollator for home and community ambulation.  Roommate helps with some cooking and supervises bathing.  She presented on 08/28/2020 after a fall with progressive weakness in bilateral upper extremities and gait abnormality.  She is noted severe numbness in bilateral arms and hands right greater than left.  Admission chemistries unremarkable except glucose 105, hemoglobin 15.3, urine culture 60,000 E. coli/50,000 Aerococcus.  Cranial CT scan unremarkable for fracture.  X-rays and imaging revealed cervical spondylitic myelopathy with severe stenosis C2-3, 3-4/central cord syndrome.  Underwent posterior cervical arthrodesis C2-3, 3-4 with lateral mass instrumentation posterior cervical bilateral laminectomy on 08/28/2020  per Dr. Reatha Armour.  Cervical collar as directed patient can remove when eating and when in bed.  She was cleared to begin subcutaneous heparin for DVT prophylaxis 09/01/2020.  Close monitoring of oxygen needs with increased exertion.  Tolerating a regular diet.  Therapy evaluations completed due to patient decreased functional mobility and self-care and was admitted for a comprehensive rehab program.  Please see preadmission assessment from earlier today as well.  Review of Systems  Constitutional:  Negative for chills and fever.  HENT:  Negative for hearing loss.   Eyes:  Negative for blurred vision and double vision.  Respiratory:  Positive for shortness of breath. Negative  for wheezing.   Cardiovascular:  Positive for leg swelling. Negative for chest pain and palpitations.  Gastrointestinal:  Positive for constipation. Negative for heartburn, nausea and vomiting.       GERD  Genitourinary:  Negative for dysuria, flank pain and hematuria.  Musculoskeletal:  Positive for back pain, falls, joint pain and myalgias.  Skin:  Negative for rash.  Neurological:  Positive for sensory change, weakness and headaches.  Psychiatric/Behavioral:  Positive for depression. The patient has insomnia.        Anxiety/bipolar disorder  All other systems reviewed and are negative. Past Medical History:  Diagnosis Date   Alcoholism ([place])    recovering since 2000   Anxiety and depression    Arthritis BACK   Bipolar disorder ([place])    Blepharospasm LEFT EYE   CHF exacerbation ([place]) 02/24/2017   Chronic back pain    COPD (chronic obstructive pulmonary disease) (HCC)    Coronary artery disease CARDIOLOGIST- DR [place]--- LAST VISIT NOTE 09-07-2009  W/ CHART   Emphysema    GERD (gastroesophageal reflux disease)    History of alcohol abuse RECOVERING SINCE 2000   History of Left renal mass 03/02/2012   underwent partial nephrectomy   HTN (hypertension)    Idiopathic acute facial nerve palsy LEFT SIDE--  BOTOX THERAPY   Inferior MI ([place]) 2000--  POST PTCA W/ STENT X1   Osteoporosis    Other and unspecified general anesthetics causing adverse effect in therapeutic use post op delirium--  last anes record w/ chart  from   09-22-2009 (spinal w/ light sedation)   Peripheral vascular disease ([place]) POST RIGHT CAROTID SURG.  1995   Renal cell carcinoma ([place]) 07/09/12   Left mass   Rosacea LEFT FACIAL RASH  S/P radiation therapy 02/21/2012   38.75 Gy HDR 5 Fractions- vaginal cuff   Scoliosis    Seizures (HCC)    x 1 after abrupt discontinuation of  Clonidine   Status post carotid endarterectomy RIGHT --  1995   Status post primary angioplasty with coronary stent 2000--  POST INFERIOR MI    Unstable balance WALKS W/ CANE   Vaginal cancer [facility])    Past Surgical History:  Procedure Laterality Date   BREAST EXCISIONAL BIOPSY Left 2019   b9 axilla Bx X 3   CAROTID ENDARTERECTOMY  1995   RIGHT   CATARACT EXTRACTION W/ INTRAOCULAR LENS  IMPLANT, BILATERAL     CERVICAL CONIZATION W/BX  09-23-2008   CORONARY ANGIOPLASTY WITH STENT PLACEMENT  2000-   INFERIOR MI   X1 STENT TO RCA   EUS N/A 03/05/2012   Procedure: FULL UPPER ENDOSCOPIC ULTRASOUND (EUS) RADIAL and EGD;  Surgeon: Milus Banister, MD;  Location: [facility];  Service: Endoscopy;  Laterality: N/A;  ercp scope first than eus scope   HEMIARTHROPLASTY HIP  12-26-2008   LEFT FEMORAL NECK FX   ORIF HIP FRACTURE  02-13-2007   RIGHT FEMORAL NECK FX   ORIF RIGHT DISTAL RADIUS AND RIGHT PROXIMAL HUMEROUS NECK FX'S  10-10-2005   POSTERIOR CERVICAL LAMINECTOMY N/A 08/28/2020   Procedure: Laminectomy and Foraminotomy - Cervical Two-Three, Cervical Three-Four, with lateral mass fusion/ fixation;  Surgeon: Dawley, Theodoro Doing, DO;  Location: [facility];  Service: Neurosurgery;  Laterality: N/A;   RIGHT SHOULDER SURG.  2007   ROBOTIC ASSITED PARTIAL NEPHRECTOMY Left 07/09/2012   Procedure: ROBOTIC ASSITED PARTIAL NEPHRECTOMY;  Surgeon: Dutch Gray, MD;  Location: [facility];  Service: Urology;  Laterality: Left;   TOTAL HIP ARTHROPLASTY  04-15-2008   POST FAILED  RIGHT HIP ORIF FEMORAL FX   TOTAL KNEE ARTHROPLASTY  09-22-2009   RIGHT   UPPER RIGHT VAGINAL REGION  12/28/11   BIOPSY: SQUAMOUS CELL CARCINOMA   VAGINAL HYSTERECTOMY  07/06/2009   Secondary to dysplasia   Family History  Problem Relation Age of Onset   Hypertension Mother    Hypertension Father    Cancer Maternal Grandfather        type unknown   Hypertension Sister    Social History:  reports that she quit smoking about 10 years ago. Her smoking use included cigarettes. She has a 100.00 pack-year smoking history. She has never used smokeless tobacco. She reports that she  does not currently use alcohol. She reports that she does not use drugs. Allergies:  Allergies  Allergen Reactions   Doxycycline Diarrhea   Gabapentin Other (See Comments)    sleepiness   Medications Prior to Admission  Medication Sig Dispense Refill   albuterol (PROVENTIL HFA;VENTOLIN HFA) 108 (90 BASE) MCG/ACT inhaler Inhale 2 puffs into the lungs every 6 (six) hours as needed for wheezing.     ARIPiprazole (ABILIFY) 10 MG tablet Take 10 mg by mouth daily.      aspirin EC 81 MG tablet Take 81 mg by mouth every morning.      Budeson-Glycopyrrol-Formoterol (BREZTRI AEROSPHERE) 160-9-4.8 MCG/ACT AERO Inhale 2 puffs into the lungs in the morning and at bedtime. 10.7 g 5   CADUET 10-20 MG tablet TAKE 1 TABLET BY MOUTH DAILY. (Patient taking differently: Take 1 tablet by mouth daily.) 90 tablet 2   Calcium Carb-Cholecalciferol 600-400 MG-UNIT TABS Take 1 tablet by mouth daily.     celecoxib (CELEBREX) 200 MG capsule Take 200 mg by mouth  daily as needed for mild pain.      Cholecalciferol (VITAMIN D3) 2000 UNITS TABS Take 2,000 Units by mouth daily.      denosumab (PROLIA) 60 MG/ML SOSY injection Inject 60 mg into the skin every 6 (six) months.     dexlansoprazole (DEXILANT) 60 MG capsule Take 60 mg by mouth daily.     docusate sodium (COLACE) 100 MG capsule Take 100 mg by mouth daily as needed for mild constipation.     ferrous sulfate 325 (65 FE) MG tablet Take 325 mg by mouth daily.     furosemide (LASIX) 20 MG tablet Take 1 tablet (20 mg total) by mouth daily. 30 tablet 3   hydrALAZINE (APRESOLINE) 25 MG tablet TAKE 1 TABLET BY MOUTH TWICE A DAY 270 tablet 3   ipratropium-albuterol (DUONEB) 0.5-2.5 (3) MG/3ML SOLN Take 3 mLs by nebulization in the morning and at bedtime. 360 mL 11   lamoTRIgine (LAMICTAL) 100 MG tablet Take 200 mg by mouth 2 (two) times daily.     montelukast (SINGULAIR) 10 MG tablet TAKE 1 TABLET BY MOUTH EVERYDAY AT BEDTIME (Patient taking differently: Take 10 mg by mouth  at bedtime.) 90 tablet 1   Multiple Vitamin (MULTIVITAMIN) capsule Take 1 capsule by mouth daily.      nitroGLYCERIN (NITROSTAT) 0.4 MG SL tablet Place 1 tablet (0.4 mg total) under the tongue every 5 (five) minutes as needed for chest pain. 25 tablet 3   Omega-3 Fatty Acids (FISH OIL) 1000 MG CAPS Take 1,000 mg by mouth 2 (two) times daily.     oxyCODONE-acetaminophen (PERCOCET) 10-325 MG per tablet Take 1 tablet by mouth every 4 (four) hours as needed for pain.      Polyethyl Glycol-Propyl Glycol (SYSTANE) 0.4-0.3 % GEL ophthalmic gel Place 1 application into both eyes daily as needed (dry eyes).     potassium chloride (KLOR-CON) 8 MEQ tablet Take 8 mEq by mouth daily.      sertraline (ZOLOFT) 50 MG tablet Take 50 mg by mouth daily.     tiZANidine (ZANAFLEX) 4 MG tablet Take 4 mg by mouth 4 (four) times daily as needed for muscle spasms.      valsartan (DIOVAN) 320 MG tablet TAKE 1 TABLET BY MOUTH EVERY DAY (Patient taking differently: Take 320 mg by mouth daily.) 90 tablet 2    Drug Regimen Review Drug regimen was reviewed and remains appropriate with no significant issues identified  Home: Home Living Family/patient expects to be discharged to:: Private residence Living Arrangements: Non-relatives/Friends Available Help at Discharge: Friend(s), Available 24 hours/day Type of Home: House (pt lives in [place]) Home Access: Stairs to enter Technical brewer of Steps: 1-2 Entrance Stairs-Rails: None Home Layout: One level Bathroom Shower/Tub: Chiropodist: Handicapped height Bathroom Accessibility: Yes Home Equipment: Environmental consultant - 4 wheels, Grab bars - tub/shower  Lives With: Friend(s)   Functional History: Prior Function Level of Independence: Needs assistance Gait / Transfers Assistance Needed: Pt uses rollator for home and community ambulation, has felt weaker recently PTA. ADL's / Homemaking Assistance Needed: Friend/Roommate [name] helps with cooking and  supervises bathing  Functional Status:  Mobility: Bed Mobility Overal bed mobility: Needs Assistance Bed Mobility: Rolling, Sidelying to Sit Rolling: Min assist Sidelying to sit: Mod assist Sit to sidelying: Mod assist General bed mobility comments: pt with decreased initiation, minA for rolling to right side, modA for trunk to upright sitting position Transfers Overall transfer level: Needs assistance Equipment used: Rolling walker (2 wheeled) Transfers: Sit  to/from Stand, Risk manager Sit to Stand: +2 safety/equipment, Mod assist Stand pivot transfers: +2 safety/equipment, Mod assist General transfer comment: ModA to stand from edge of bed and pivot to [facility] (+2 safety); attempted to take steps away from [facility] once finished, however, pt with too much trunk flexion to complete, therefore, chair brought up behind her to sit down Ambulation/Gait Ambulation/Gait assistance: Min guard Gait Distance (Feet): 15 Feet Assistive device: Rolling walker (2 wheeled) Gait Pattern/deviations: Step-through pattern, Decreased step length - right, Decreased step length - left, Decreased dorsiflexion - right, Decreased dorsiflexion - left General Gait Details: Deferred today secondary to pain Gait velocity: decreased    ADL: ADL Overall ADL's : Needs assistance/impaired Eating/Feeding: Bed level, Minimal assistance Eating/Feeding Details (indicate cue type and reason): hob elevated. pt. required min a for opening containers but was able to open approx. 50% of the way before handing it and saying she could not finish doing it.  declined attempts to spread cream cheese. once bagel ready, max encouragement and pt. was able to self feed. also able to reach for cup and take sips through straw no spilliage or issues noted. Grooming: Moderate assistance Lower Body Bathing: Maximal assistance, Bed level Upper Body Dressing : Maximal assistance Lower Body Dressing: Total assistance, Bed  level Functional mobility during ADLs:  (only able to tolerate bed mobility his date due to pain) General ADL Comments: encouragement to initiate and engage in adl of self feeding.  verbalizes want and understanding to perform but required cues and coaxing to engage  Cognition: Cognition Overall Cognitive Status: Impaired/Different from baseline Orientation Level: Oriented X4 Cognition Arousal/Alertness: Awake/alert Behavior During Therapy: Anxious Overall Cognitive Status: Impaired/Different from baseline Area of Impairment: Attention, Problem solving Current Attention Level: Sustained Problem Solving: Decreased initiation, Requires verbal cues General Comments: Question if pain medication affecting cognition today. Pt repeatedly stating "help me," throughout session, although would not specify with what. Reporting back pain, premedicated prior to session.  Physical Exam: Blood pressure (!) 117/50, pulse 68, temperature 97.8 F (36.6 C), temperature source Axillary, resp. rate 15, height '5\' 3"'$  (1.6 m), weight 77.1 kg, SpO2 94 %. Physical Exam Vitals reviewed.  Constitutional:      General: She is not in acute distress.    Appearance: She is obese.  HENT:     Head: Normocephalic and atraumatic.     Nose: Nose normal.  Eyes:     General:        Right eye: No discharge.        Left eye: No discharge.     Extraocular Movements: Extraocular movements intact.  Neck:     Comments: + C-collar Cardiovascular:     Rate and Rhythm: Normal rate and regular rhythm.  Pulmonary:     Effort: Pulmonary effort is normal. No respiratory distress.     Breath sounds: No stridor.  Abdominal:     Palpations: Abdomen is soft.     Comments: Bowel sounds hypoactive  Musculoskeletal:     Comments: Lower extremity edema.  No tenderness in extremities.  Skin:    General: Skin is warm and dry.     Comments: Incision CDI  Neurological:     Mental Status: She is alert.     Comments:  Alert Oriented to person and place. Neuro: 4-4+ throughout  Psychiatric:        Mood and Affect: Affect is blunt.        Speech: Speech is delayed.  Behavior: Behavior is slowed.    No results found for this or any previous visit (from the past 48 hour(s)). No results found.     Medical Problem List and Plan: 1.  Gait abnormality secondary to severe spondylitic myelopathy with severe stenosis C2-3 3-4/central cord syndrome.  Status post cervical arthrodesis with posterior cervical bilateral laminectomy 08/28/2020.  Cervical collar as directed  -patient may shower  -ELOS/Goals: Supervision/Min A 17-21 days.  Admit to CIR 2.  Antithrombotics: -DVT/anticoagulation: Subcutaneous heparin.  Check vascular study Mechanical: Sequential compression devices, below knee Bilateral lower extremities Pharmaceutical: Lovenox  -antiplatelet therapy: N/A 3. Pain Management: Oxycodone as needed, Robaxin as needed  Monitor with increased exertion 4. Mood: Zoloft 50 mg daily, Lamictal 200 mg twice daily  -antipsychotic agents: Abilify 10 mg daily 5. Neuropsych: This patient is?  Fully capable of making decisions on her own behalf. 6. Skin/Wound Care: Routine skin checks 7. Fluids/Electrolytes/Nutrition: Routine in and outs  CMP ordered for tomorrow 8.  Hypertension.  Hydralazine 25 mg twice daily, Avapro 300 mg daily, Norvasc 10 mg daily  Monitor with increased mobility 9.  COPD/history of tobacco use.  Continue inhalers as directed.  Monitor oxygen saturations 10.  GERD.  Protonix 11.  CAD with history of stenting.  Plan to discuss with neurosurgery when to resume low-dose aspirin 12.  Diastolic congestive heart failure.  Lasix 20 mg daily.  Monitor for any signs of fluid overload Filed Weights   08/28/20 1024 08/28/20 1112  Weight: 77.1 kg 77.1 kg    Cathlyn Parsons, PA-C 09/02/2020   I have personally performed a face to face diagnostic evaluation, including, but not limited to  relevant history and physical exam findings, of this patient and developed relevant assessment and plan.  Additionally, I have reviewed and concur with the physician assistant's documentation above.  Delice Lesch, MD, ABPMR

## 2020-09-02 NOTE — Progress Notes (Signed)
Occupational Therapy Treatment Patient Details Name: [name] MRN: [id number] DOB: 1945/09/06 Today's Date: 09/02/2020    History of present illness Pt presents  with C2-4 myelopathy and central cord syndrome, now s/p posterior C2-4  laminectomy, instrumentation and fusion on 8/12. PMH: [name], osteoperosis, history of vaginal cancer and renal cancer, HTN, facial Martinez, alcohol use disorder.   [name] comments  Pt progress from bed to  mod +2 (A) [name] to short room distance transfer then chair positioned for oob this morning. Pt tolerating well. Pt in good spirits at this time. Recommendation for [name] .   Follow Up Recommendations  [name]    Equipment Recommendations  3 in 1 bedside commode    Recommendations for Other Services Rehab consult    Precautions / Restrictions Precautions Precautions: Cervical;Fall Precaution Comments: watch )2 Required Braces or Orthoses: Cervical Brace Cervical Brace: Hard collar;At all times       Mobility Bed Mobility Overal bed mobility: Needs Assistance Bed Mobility: Rolling;Supine to Sit Rolling: Min assist Sidelying to sit: Min assist       General bed mobility comments: pt able to progress bil LE to eob . pt needs (A) to progress L side out of bed with min (A). pt static sitting min (A) with one lob posterior attempting to weight shift for purewick to be removed    Transfers Overall transfer level: Needs assistance Equipment used: Rolling walker (2 wheeled) Transfers: Sit to/from Stand Sit to Stand: +2 safety/equipment;Mod assist              Balance Overall balance assessment: Needs assistance Sitting-balance support: Bilateral upper extremity supported;Feet supported Sitting balance-Leahy Scale: Fair     Standing balance support: Bilateral upper extremity supported;During functional activity Standing balance-Leahy Scale: Poor Standing balance comment: requires RW and (A) for balance                           ADL  either performed or assessed with clinical judgement   ADL Overall ADL's : Needs assistance/impaired                         Toilet Transfer: +2 for physical assistance;Moderate assistance;[name];RW   Toileting- Clothing Manipulation and Hygiene: Total assistance;Sit to/from stand Toileting - Clothing Manipulation Details (indicate cue type and reason): static standing with mod (A) and total (A) for peri care     Functional mobility during ADLs: +2 for physical assistance;Moderate assistance;Rolling walker General ADL Comments: Pt complete bed to [name] transfer and then progressed to doorway. pt noted to have redness at buttock area and positoined in chair at end of session. pt advised to sit up for 1 hour for skin integrity.     Vision       Perception     Praxis      Cognition Arousal/Alertness: Awake/alert Behavior During Therapy: Anxious Overall Cognitive Status: Impaired/Different from baseline Area of Impairment: Attention;Problem solving                   Current Attention Level: Sustained           General Comments: pt making a few jokes this session. pt introducing brother.        Exercises     Shoulder Instructions       General Comments noted to have redness at chin with new dressing placed on chin for skin protection. pt reports increased comfort. pt with padding  added to [name] for decreased pressure at ears    Pertinent Vitals/ Pain       Pain Assessment: Faces Faces Pain Scale: Hurts little more Pain Location: back Pain Descriptors / Indicators: Discomfort;Operative site guarding;Grimacing Pain Intervention(s): Monitored during session;Repositioned  Home Living                                          Prior Functioning/Environment              Frequency  Min 2X/week        Progress Toward Goals  [name] Goals(current goals can now be found in the care plan section)  Progress towards [name] goals: Progressing  toward goals  Acute Rehab [name] Goals Patient Stated Goal: to go to rehab today [name] Goal Formulation: With patient Time For Goal Achievement: 09/12/20 Potential to Achieve Goals: Good ADL Goals Pt Will Perform Eating: with modified independence;sitting;with adaptive utensils Pt Will Perform Grooming: with set-up;sitting Pt Will Perform Upper Body Bathing: with set-up;sitting Pt Will Perform Lower Body Bathing: with supervision;with set-up;sit to/from stand;with adaptive equipment Pt Will Transfer to Toilet: with modified independence;ambulating Pt Will Perform Toileting - Clothing Manipulation and hygiene: with set-up;with supervision;sitting/lateral leans;sit to/from stand;with adaptive equipment Pt/caregiver will Perform Home Exercise Program: Increased strength;Increased ROM;Both right and left upper extremity;Independently;With written HEP provided Additional ADL Goal #1: Pt will independently follow 3 cervical precautions dueing ADL tasks  Plan Discharge plan remains appropriate    Co-evaluation                 AM-PAC [name] "6 Clicks" Daily Activity     Outcome Measure   Help from another person eating meals?: A Little Help from another person taking care of personal grooming?: A Lot Help from another person toileting, which includes using toliet, bedpan, or urinal?: A Lot Help from another person bathing (including washing, rinsing, drying)?: A Lot Help from another person to put on and taking off regular upper body clothing?: A Lot Help from another person to put on and taking off regular lower body clothing?: Total 6 Click Martinez: 12    End of Session Equipment Utilized During Treatment: Gait belt;Rolling walker;Oxygen  [name] Visit Diagnosis: Unsteadiness on feet ([name]);Other abnormalities of gait and mobility ([name]);Muscle weakness (generalized) ([name]);History of falling ([name]);Pain   Activity Tolerance Patient tolerated treatment well   Patient Left in chair;with  call bell/phone within reach;with chair alarm set;with family/visitor present (brother [name] present)   Nurse Communication Mobility status;Precautions        Time: [id number] [name] Time Calculation (min): 23 min  Charges: [name] General Charges $[name] Visit: 1 Visit [name] Treatments $Self Care/Home Management : 8-22 mins   [name], OTR/L  Acute Rehabilitation Services Pager: [phone] Office: [phone] .    Jeri Modena 09/02/2020, 11:52 AM

## 2020-09-02 NOTE — Progress Notes (Signed)
Inpatient Rehabilitation Medication Review by a Pharmacist  A complete drug regimen review was completed for this patient to identify any potential clinically significant medication issues.  High Risk Drug Classes Is patient taking? Indication by Medication  Antipsychotic Yes Abilify for adjunct for depression  Anticoagulant Yes Heparin sq for VTE px  Antibiotic No   Opioid Yes Oxycodone for pain  Antiplatelet No   Hypoglycemics/insulin No   Vasoactive Medication Yes Amlodpine, hydralazine, and irbesartan (for valsartan) for HTN  Chemotherapy No   Other No      Type of Medication Issue Identified Description of Issue Recommendation(s)  Drug Interaction(s) (clinically significant)     Duplicate Therapy     Allergy     No Medication Administration End Date     Incorrect Dose     Additional Drug Therapy Needed  Per d/c summary, patient should be on Vit D, ASA '81mg'$ , calcium, iron, and fish oil Please consider starting these medications if needed.   Significant med changes from prior encounter (inform family/care partners about these prior to discharge). ON ASA PTA, not on ASA during acute admit, but on d/c summary Please f/u need for ASA  Other       Clinically significant medication issues were identified that warrant physician communication and completion of prescribed/recommended actions by midnight of the next day:  Yes  Name of provider notified for urgent issues identified: Marlowe Shores, PA  Provider Method of Notification: secure chat    Pharmacist comments:   Time spent performing this drug regimen review (minutes):  20   Marques Ericson A. Levada Dy, PharmD, BCPS, FNKF Clinical Pharmacist [place] Please utilize Amion for appropriate phone number to reach the unit pharmacist ([place])  09/02/2020 2:26 PM

## 2020-09-02 NOTE — H&P (Signed)
Physical Medicine and Rehabilitation Admission H&P     HPI: [name]. Haghighi is a 75 year old right-handed female with history of anxiety/depression./Bipolar disorder diastolic congestive heart failure, COPD quit smoking 10 years ago, alcohol use, history of left renal mass partial nephrectomy 2014 as well as radiation therapy, CAD with stenting maintained on low-dose aspirin, chronic back pain.  History taken from chart review, and patient.  Patient lives with [name]/roommate.  1 level home one-step to entry.  Use a Rollator for home and community ambulation.  Roommate helps with some cooking and supervises bathing.  She presented on 08/28/2020 after a fall with progressive weakness in bilateral upper extremities and gait abnormality.  She is noted severe numbness in bilateral arms and hands right greater than left.  Admission chemistries unremarkable except glucose 105, hemoglobin 15.3, urine culture 60,000 E. coli/50,000 Aerococcus.  Cranial CT scan unremarkable for fracture.  X-rays and imaging revealed cervical spondylitic myelopathy with severe stenosis C2-3, 3-4/central cord syndrome.  Underwent posterior cervical arthrodesis C2-3, 3-4 with lateral mass instrumentation posterior cervical bilateral laminectomy on 08/28/2020  per Dr. Reatha Armour.  Cervical collar as directed patient can remove when eating and when in bed.  She was cleared to begin subcutaneous heparin for DVT prophylaxis 09/01/2020.  Close monitoring of oxygen needs with increased exertion.  Tolerating a regular diet.  Therapy evaluations completed due to patient decreased functional mobility and self-care and was admitted for a comprehensive rehab program.  Please see preadmission assessment from earlier today as well.  Review of Systems  Constitutional:  Negative for chills and fever.  HENT:  Negative for hearing loss.   Eyes:  Negative for blurred vision and double vision.  Respiratory:  Positive for shortness of breath. Negative  for wheezing.   Cardiovascular:  Positive for leg swelling. Negative for chest pain and palpitations.  Gastrointestinal:  Positive for constipation. Negative for heartburn, nausea and vomiting.       GERD  Genitourinary:  Negative for dysuria, flank pain and hematuria.  Musculoskeletal:  Positive for back pain, falls, joint pain and myalgias.  Skin:  Negative for rash.  Neurological:  Positive for sensory change, weakness and headaches.  Psychiatric/Behavioral:  Positive for depression. The patient has insomnia.        Anxiety/bipolar disorder  All other systems reviewed and are negative. Past Medical History:  Diagnosis Date   Alcoholism ([place])    recovering since 2000   Anxiety and depression    Arthritis BACK   Bipolar disorder ([place])    Blepharospasm LEFT EYE   CHF exacerbation ([place]) 02/24/2017   Chronic back pain    COPD (chronic obstructive pulmonary disease) ([facility])    Coronary artery disease CARDIOLOGIST- DR [place]--- LAST VISIT NOTE 09-07-2009  W/ CHART   Emphysema    GERD (gastroesophageal reflux disease)    History of alcohol abuse RECOVERING SINCE 2000   History of Left renal mass 03/02/2012   underwent partial nephrectomy   HTN (hypertension)    Idiopathic acute facial nerve palsy LEFT SIDE--  BOTOX THERAPY   Inferior MI ([place]) 2000--  POST PTCA W/ STENT X1   Osteoporosis    Other and unspecified general anesthetics causing adverse effect in therapeutic use post op delirium--  last anes record w/ chart  from   09-22-2009 (spinal w/ light sedation)   Peripheral vascular disease ([place]) POST RIGHT CAROTID SURG.  1995   Renal cell carcinoma ([place]) 07/09/12   Left mass   Rosacea LEFT FACIAL RASH  S/P radiation therapy 02/21/2012   38.75 Gy HDR 5 Fractions- vaginal cuff   Scoliosis    Seizures (H[facility])    x 1 after abrupt discontinuation of  Clonidine   Status post carotid endarterectomy RIGHT --  1995   Status post primary angioplasty with coronary stent 2000--  POST INFERIOR MI    Unstable balance WALKS W/ CANE   Vaginal cancer [facility])    Past Surgical History:  Procedure Laterality Date   BREAST EXCISIONAL BIOPSY Left 2019   b9 axilla Bx X 3   CAROTID ENDARTERECTOMY  1995   RIGHT   CATARACT EXTRACTION W/ INTRAOCULAR LENS  IMPLANT, BILATERAL     CERVICAL CONIZATION W/BX  09-23-2008   CORONARY ANGIOPLASTY WITH STENT PLACEMENT  2000-   INFERIOR MI   X1 STENT TO RCA   EUS N/A 03/05/2012   Procedure: FULL UPPER ENDOSCOPIC ULTRASOUND (EUS) RADIAL and EGD;  Surgeon: Milus Banister, MD;  Location: [facility];  Service: Endoscopy;  Laterality: N/A;  ercp scope first than eus scope   HEMIARTHROPLASTY HIP  12-26-2008   LEFT FEMORAL NECK FX   ORIF HIP FRACTURE  02-13-2007   RIGHT FEMORAL NECK FX   ORIF RIGHT DISTAL RADIUS AND RIGHT PROXIMAL HUMEROUS NECK FX'S  10-10-2005   POSTERIOR CERVICAL LAMINECTOMY N/A 08/28/2020   Procedure: Laminectomy and Foraminotomy - Cervical Two-Three, Cervical Three-Four, with lateral mass fusion/ fixation;  Surgeon: Dawley, Theodoro Doing, DO;  Location: [facility];  Service: Neurosurgery;  Laterality: N/A;   RIGHT SHOULDER SURG.  2007   ROBOTIC ASSITED PARTIAL NEPHRECTOMY Left 07/09/2012   Procedure: ROBOTIC ASSITED PARTIAL NEPHRECTOMY;  Surgeon: Dutch Gray, MD;  Location: [facility];  Service: Urology;  Laterality: Left;   TOTAL HIP ARTHROPLASTY  04-15-2008   POST FAILED  RIGHT HIP ORIF FEMORAL FX   TOTAL KNEE ARTHROPLASTY  09-22-2009   RIGHT   UPPER RIGHT VAGINAL REGION  12/28/11   BIOPSY: SQUAMOUS CELL CARCINOMA   VAGINAL HYSTERECTOMY  07/06/2009   Secondary to dysplasia   Family History  Problem Relation Age of Onset   Hypertension Mother    Hypertension Father    Cancer Maternal Grandfather        type unknown   Hypertension Sister    Social History:  reports that she quit smoking about 10 years ago. Her smoking use included cigarettes. She has a 100.00 pack-year smoking history. She has never used smokeless tobacco. She reports that she  does not currently use alcohol. She reports that she does not use drugs. Allergies:  Allergies  Allergen Reactions   Doxycycline Diarrhea   Gabapentin Other (See Comments)    sleepiness   Medications Prior to Admission  Medication Sig Dispense Refill   albuterol (PROVENTIL HFA;VENTOLIN HFA) 108 (90 BASE) MCG/ACT inhaler Inhale 2 puffs into the lungs every 6 (six) hours as needed for wheezing.     ARIPiprazole (ABILIFY) 10 MG tablet Take 10 mg by mouth daily.      aspirin EC 81 MG tablet Take 81 mg by mouth every morning.      Budeson-Glycopyrrol-Formoterol (BREZTRI AEROSPHERE) 160-9-4.8 MCG/ACT AERO Inhale 2 puffs into the lungs in the morning and at bedtime. 10.7 g 5   CADUET 10-20 MG tablet TAKE 1 TABLET BY MOUTH DAILY. (Patient taking differently: Take 1 tablet by mouth daily.) 90 tablet 2   Calcium Carb-Cholecalciferol 600-400 MG-UNIT TABS Take 1 tablet by mouth daily.     celecoxib (CELEBREX) 200 MG capsule Take 200 mg by mouth  daily as needed for mild pain.      Cholecalciferol (VITAMIN D3) 2000 UNITS TABS Take 2,000 Units by mouth daily.      denosumab (PROLIA) 60 MG/ML SOSY injection Inject 60 mg into the skin every 6 (six) months.     dexlansoprazole (DEXILANT) 60 MG capsule Take 60 mg by mouth daily.     docusate sodium (COLACE) 100 MG capsule Take 100 mg by mouth daily as needed for mild constipation.     ferrous sulfate 325 (65 FE) MG tablet Take 325 mg by mouth daily.     furosemide (LASIX) 20 MG tablet Take 1 tablet (20 mg total) by mouth daily. 30 tablet 3   hydrALAZINE (APRESOLINE) 25 MG tablet TAKE 1 TABLET BY MOUTH TWICE A DAY 270 tablet 3   ipratropium-albuterol (DUONEB) 0.5-2.5 (3) MG/3ML SOLN Take 3 mLs by nebulization in the morning and at bedtime. 360 mL 11   lamoTRIgine (LAMICTAL) 100 MG tablet Take 200 mg by mouth 2 (two) times daily.     montelukast (SINGULAIR) 10 MG tablet TAKE 1 TABLET BY MOUTH EVERYDAY AT BEDTIME (Patient taking differently: Take 10 mg by mouth  at bedtime.) 90 tablet 1   Multiple Vitamin (MULTIVITAMIN) capsule Take 1 capsule by mouth daily.      nitroGLYCERIN (NITROSTAT) 0.4 MG SL tablet Place 1 tablet (0.4 mg total) under the tongue every 5 (five) minutes as needed for chest pain. 25 tablet 3   Omega-3 Fatty Acids (FISH OIL) 1000 MG CAPS Take 1,000 mg by mouth 2 (two) times daily.     oxyCODONE-acetaminophen (PERCOCET) 10-325 MG per tablet Take 1 tablet by mouth every 4 (four) hours as needed for pain.      Polyethyl Glycol-Propyl Glycol (SYSTANE) 0.4-0.3 % GEL ophthalmic gel Place 1 application into both eyes daily as needed (dry eyes).     potassium chloride (KLOR-CON) 8 MEQ tablet Take 8 mEq by mouth daily.      sertraline (ZOLOFT) 50 MG tablet Take 50 mg by mouth daily.     tiZANidine (ZANAFLEX) 4 MG tablet Take 4 mg by mouth 4 (four) times daily as needed for muscle spasms.      valsartan (DIOVAN) 320 MG tablet TAKE 1 TABLET BY MOUTH EVERY DAY (Patient taking differently: Take 320 mg by mouth daily.) 90 tablet 2    Drug Regimen Review Drug regimen was reviewed and remains appropriate with no significant issues identified  Home: Home Living Family/patient expects to be discharged to:: Private residence Living Arrangements: Non-relatives/Friends Available Help at Discharge: Friend(s), Available 24 hours/day Type of Home: House (pt lives in [place]) Home Access: Stairs to enter Technical brewer of Steps: 1-2 Entrance Stairs-Rails: None Home Layout: One level Bathroom Shower/Tub: Chiropodist: Handicapped height Bathroom Accessibility: Yes Home Equipment: Environmental consultant - 4 wheels, Grab bars - tub/shower  Lives With: Friend(s)   Functional History: Prior Function Level of Independence: Needs assistance Gait / Transfers Assistance Needed: Pt uses rollator for home and community ambulation, has felt weaker recently PTA. ADL's / Homemaking Assistance Needed: Friend/Roommate [name] helps with cooking and  supervises bathing  Functional Status:  Mobility: Bed Mobility Overal bed mobility: Needs Assistance Bed Mobility: Rolling, Sidelying to Sit Rolling: Min assist Sidelying to sit: Mod assist Sit to sidelying: Mod assist General bed mobility comments: pt with decreased initiation, minA for rolling to right side, modA for trunk to upright sitting position Transfers Overall transfer level: Needs assistance Equipment used: Rolling walker (2 wheeled) Transfers: Sit  to/from Stand, Risk manager Sit to Stand: +2 safety/equipment, Mod assist Stand pivot transfers: +2 safety/equipment, Mod assist General transfer comment: ModA to stand from edge of bed and pivot to [facility] (+2 safety); attempted to take steps away from [facility] once finished, however, pt with too much trunk flexion to complete, therefore, chair brought up behind her to sit down Ambulation/Gait Ambulation/Gait assistance: Min guard Gait Distance (Feet): 15 Feet Assistive device: Rolling walker (2 wheeled) Gait Pattern/deviations: Step-through pattern, Decreased step length - right, Decreased step length - left, Decreased dorsiflexion - right, Decreased dorsiflexion - left General Gait Details: Deferred today secondary to pain Gait velocity: decreased    ADL: ADL Overall ADL's : Needs assistance/impaired Eating/Feeding: Bed level, Minimal assistance Eating/Feeding Details (indicate cue type and reason): hob elevated. pt. required min a for opening containers but was able to open approx. 50% of the way before handing it and saying she could not finish doing it.  declined attempts to spread cream cheese. once bagel ready, max encouragement and pt. was able to self feed. also able to reach for cup and take sips through straw no spilliage or issues noted. Grooming: Moderate assistance Lower Body Bathing: Maximal assistance, Bed level Upper Body Dressing : Maximal assistance Lower Body Dressing: Total assistance, Bed  level Functional mobility during ADLs:  (only able to tolerate bed mobility his date due to pain) General ADL Comments: encouragement to initiate and engage in adl of self feeding.  verbalizes want and understanding to perform but required cues and coaxing to engage  Cognition: Cognition Overall Cognitive Status: Impaired/Different from baseline Orientation Level: Oriented X4 Cognition Arousal/Alertness: Awake/alert Behavior During Therapy: Anxious Overall Cognitive Status: Impaired/Different from baseline Area of Impairment: Attention, Problem solving Current Attention Level: Sustained Problem Solving: Decreased initiation, Requires verbal cues General Comments: Question if pain medication affecting cognition today. Pt repeatedly stating "help me," throughout session, although would not specify with what. Reporting back pain, premedicated prior to session.  Physical Exam: Blood pressure (!) 117/50, pulse 68, temperature 97.8 F (36.6 C), temperature source Axillary, resp. rate 15, height '5\' 3"'$  (1.6 m), weight 77.1 kg, SpO2 94 %. Physical Exam Vitals reviewed.  Constitutional:      General: She is not in acute distress.    Appearance: She is obese.  HENT:     Head: Normocephalic and atraumatic.     Nose: Nose normal.  Eyes:     General:        Right eye: No discharge.        Left eye: No discharge.     Extraocular Movements: Extraocular movements intact.  Neck:     Comments: + C-collar Cardiovascular:     Rate and Rhythm: Normal rate and regular rhythm.  Pulmonary:     Effort: Pulmonary effort is normal. No respiratory distress.     Breath sounds: No stridor.  Abdominal:     Palpations: Abdomen is soft.     Comments: Bowel sounds hypoactive  Musculoskeletal:     Comments: Lower extremity edema.  No tenderness in extremities.  Skin:    General: Skin is warm and dry.     Comments: Incision CDI  Neurological:     Mental Status: She is alert.     Comments:  Alert Oriented to person and place. Neuro: 4-4+ throughout  Psychiatric:        Mood and Affect: Affect is blunt.        Speech: Speech is delayed.  Behavior: Behavior is slowed.    No results found for this or any previous visit (from the past 48 hour(s)). No results found.     Medical Problem List and Plan: 1.  Gait abnormality secondary to severe spondylitic myelopathy with severe stenosis C2-3 3-4/central cord syndrome.  Status post cervical arthrodesis with posterior cervical bilateral laminectomy 08/28/2020.  Cervical collar as directed  -patient may shower  -ELOS/Goals: Supervision/Min A 17-21 days.  Admit to CIR 2.  Antithrombotics: -DVT/anticoagulation: Subcutaneous heparin.  Check vascular study Mechanical: Sequential compression devices, below knee Bilateral lower extremities Pharmaceutical: Lovenox  -antiplatelet therapy: N/A 3. Pain Management: Oxycodone as needed, Robaxin as needed  Monitor with increased exertion 4. Mood: Zoloft 50 mg daily, Lamictal 200 mg twice daily  -antipsychotic agents: Abilify 10 mg daily 5. Neuropsych: This patient is?  Fully capable of making decisions on her own behalf. 6. Skin/Wound Care: Routine skin checks 7. Fluids/Electrolytes/Nutrition: Routine in and outs  CMP ordered for tomorrow 8.  Hypertension.  Hydralazine 25 mg twice daily, Avapro 300 mg daily, Norvasc 10 mg daily  Monitor with increased mobility 9.  COPD/history of tobacco use.  Continue inhalers as directed.  Monitor oxygen saturations 10.  GERD.  Protonix 11.  CAD with history of stenting.  Plan to discuss with neurosurgery when to resume low-dose aspirin 12.  Diastolic congestive heart failure.  Lasix 20 mg daily.  Monitor for any signs of fluid overload Filed Weights   08/28/20 1024 08/28/20 1112  Weight: 77.1 kg 77.1 kg    Cathlyn Parsons, PA-C 09/02/2020   I have personally performed a face to face diagnostic evaluation, including, but not limited to  relevant history and physical exam findings, of this patient and developed relevant assessment and plan.  Additionally, I have reviewed and concur with the physician assistant's documentation above.  Delice Lesch, MD, ABPMR The patient's status has not changed. Any changes from the pre-admission screening or documentation from the acute chart are noted above.   Delice Lesch, MD, ABPMR

## 2020-09-02 NOTE — Progress Notes (Signed)
INPATIENT REHABILITATION ADMISSION NOTE   Arrival Method: Bed     Mental Orientation:AOx4   Assessment:Done   Skin:Done   IV'S:Right Wrist   Pain:10:10   Tubes and Drains:None   Safety Measures:Reviewed   Vital Signs:Done   Height and Weight:Done   Rehab Orientation:Done   Family:At bedside    Notes: Done

## 2020-09-02 NOTE — Progress Notes (Signed)
Inpatient Rehab Admissions Coordinator:  There is a bed available in CIR for pt to admit today. Dr. Reatha Armour is aware and in agreement. Pt, [name] (friend), NSG, TOC made aware.   Gayland Curry, [place], [place] Admissions Coordinator [phone]

## 2020-09-02 NOTE — Progress Notes (Signed)
Physical Therapy Treatment Patient Details Name: [name] MRN: [id number] DOB: April 22, 1945 Today's Date: 09/02/2020    History of Present Illness Pt presents  with C2-4 myelopathy and central cord syndrome, now s/p posterior C2-4  laminectomy, instrumentation and fusion on 8/12. PMH: MI, osteoperosis, history of vaginal cancer and renal cancer, HTN, facial palsy, alcohol use disorder.    PT Comments    Pt is progressing toward her goals and in generally good spirits today. Required min assist for bed mobility, mod assist +2 for transfers and ambulation within the room. Reinforced education on precautions and collar; pt verbalized understanding. Current discharge recommendations remain appropriate. We will continue to follow her acutely to promote independence with functional mobility.   Follow Up Recommendations  CIR     Equipment Recommendations  Rolling walker with 5" wheels;3in1 (PT)    Recommendations for Other Services       Precautions / Restrictions Precautions Precautions: Cervical;Fall Precaution Booklet Issued: No Precaution Comments: watch O2 sats Required Braces or Orthoses: Cervical Brace Cervical Brace: Hard collar;At all times Restrictions Weight Bearing Restrictions: No    Mobility  Bed Mobility Overal bed mobility: Needs Assistance Bed Mobility: Rolling;Supine to Sit Rolling: Min assist Sidelying to sit: Min assist       General bed mobility comments: Pt requires min Assist for trunk control; able to progress her LEs with only supervision level assist. Pt had one posterior LOB during purewick removal. Min guard assist to posterior trunk post LOB.    Transfers Overall transfer level: Needs assistance Equipment used: Rolling walker (2 wheeled) Transfers: Sit to/from Stand Sit to Stand: +2 safety/equipment;Mod assist Stand pivot transfers: +2 safety/equipment;Mod assist       General transfer comment: Pt required mod assist to stand from EOB  and pivot to BSC (+2 safety). Required BUE on RW during transfer with multimodal cues for hand placement. Pt attempted to sit too early and had to perform secondary attempt at stand to sit on [facility] that was successful.  Ambulation/Gait Ambulation/Gait assistance: Min guard;+2 safety/equipment Gait Distance (Feet): 15 Feet Assistive device: Rolling walker (2 wheeled) Gait Pattern/deviations: Step-through pattern;Decreased step length - right;Decreased step length - left;Decreased dorsiflexion - right;Decreased dorsiflexion - left;Trunk flexed Gait velocity: decreased   General Gait Details: Pt ambulated from [facility] to door with recliner follow for safety; demonstrated high level of trunk flexion and required BUE on RW. High levels of encouragement to acheive door.   Stairs             Wheelchair Mobility    Modified Rankin (Stroke Patients Only)       Balance Overall balance assessment: Needs assistance Sitting-balance support: Bilateral upper extremity supported;Feet supported Sitting balance-Leahy Scale: Poor Sitting balance - Comments: Pt demonstrated posterior lean with single LOB during purewick removal. Postural control: Posterior lean Standing balance support: Bilateral upper extremity supported;During functional activity Standing balance-Leahy Scale: Poor Standing balance comment: Pt reliant on BUE on RW for balance                            Cognition Arousal/Alertness: Awake/alert Behavior During Therapy: Anxious Overall Cognitive Status: Impaired/Different from baseline Area of Impairment: Attention;Problem solving                   Current Attention Level: Sustained         Problem Solving: Decreased initiation;Requires verbal cues General Comments: Pt generally more motivated and involved with session today.  Cracking jokes and introduces her brother.      Exercises      General Comments General comments (skin integrity, edema, etc.):  Pt on 5L of O2 with SpO2 ranging from 86-95%. Skin of chin inspected and found to have areas of redness bilaterally so new dressing was placed for protection; pt reports increased comfort. Brother [name] in the room for first half of session.      Pertinent Vitals/Pain Pain Assessment: Faces Faces Pain Scale: Hurts little more Pain Location: back Pain Descriptors / Indicators: Discomfort;Operative site guarding;Grimacing Pain Intervention(s): Monitored during session;Repositioned    Home Living                      Prior Function            PT Goals (current goals can now be found in the care plan section) Acute Rehab PT Goals Patient Stated Goal: to go to rehab today PT Goal Formulation: With patient Time For Goal Achievement: 09/12/20 Potential to Achieve Goals: Good Progress towards PT goals: Progressing toward goals    Frequency    Min 5X/week      PT Plan Current plan remains appropriate    Co-evaluation PT/OT/SLP Co-Evaluation/Treatment: Yes Reason for Co-Treatment: For patient/therapist safety;To address functional/ADL transfers PT goals addressed during session: Mobility/safety with mobility        AM-PAC PT "6 Clicks" Mobility   Outcome Measure  Help needed turning from your back to your side while in a flat bed without using bedrails?: A Little Help needed moving from lying on your back to sitting on the side of a flat bed without using bedrails?: A Little Help needed moving to and from a bed to a chair (including a wheelchair)?: A Lot Help needed standing up from a chair using your arms (e.g., wheelchair or bedside chair)?: A Lot Help needed to walk in hospital room?: A Lot Help needed climbing 3-5 steps with a railing? : Total 6 Click Score: 13    End of Session Equipment Utilized During Treatment: Gait belt;Oxygen (5L via [place]) Activity Tolerance: Patient tolerated treatment well Patient left: in chair;with call bell/phone within reach;with  chair alarm set Nurse Communication: Mobility status PT Visit Diagnosis: Muscle weakness (generalized) (M62.81);Other abnormalities of gait and mobility (R26.89);Pain Pain - part of body:  (back/neck)     Time: [id number] PT Time Calculation (min) (ACUTE ONLY): 23 min  Charges:  $Therapeutic Activity: 8-22 mins                     Dawayne Cirri, SPT Dawayne Cirri 09/02/2020, 3:10 PM

## 2020-09-02 NOTE — Discharge Summary (Signed)
Physician Discharge Summary  Patient ID: [name] MRN: [id number] DOB/AGE: 04/07/1945 75 y.o.  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 The patient tolerated the procedure well and was taken to the recovery room and then to the floor in stable condition. The hospital course was routine. There were no complications. The wound remained clean dry and intact. Pt had appropriate upper back soreness. No complaints of arm pain or new N/T/W. The patient remained afebrile with stable vital signs, and tolerated a regular diet. The patient continued to increase activities, and pain was well controlled with oral pain medications.   Consults: None  Significant Diagnostic Studies: radiology: X-Ray: intraoperative   Treatments: surgery:  Posterior cervical arthrodesis, C2-3, C3-4, with lateral mass instrumentation; K2M [place] instrumentation Posterior cervical bilateral laminectomy C2, C3, C4 Intraoperative use of fluoroscopy, less than 1 hour Intraoperative use of allograft Intraoperative use of autograft, same incision Intraoperative use of neuro monitoring, SSEPs Intraoperative use of microscope, for microdissection  Discharge Exam: Blood pressure (!) 125/54, pulse 84, temperature 98.3 F (36.8 C), temperature source Oral, resp. rate 18, height '5\' 3"'$  (1.6 m), weight 77.1 kg, SpO2 91 %.   Physical Exam: Patient is awake, A/O X 4, conversant, and in good spirits. They are in NAD and VSS.  Doing well. Speech is fluent and appropriate. 4+/5 BLE  BUE delt 3/5, bi/tri/grip 4/5. PERLA, EOMI. CNs grossly intact. Hard cervical collar in place. Dressing is clean dry intact. Incision is well approximated with no drainage, erythema, or edema. Drain in place with minimal output.   Disposition: Discharge disposition: [facility]-Another [facility] Care Institution Not Defined       Discharge Instructions     Incentive spirometry RT   Complete by: As directed       Allergies as of 09/02/2020       Reactions   Doxycycline Diarrhea   Gabapentin Other (See Comments)   sleepiness        Medication List     TAKE these medications    albuterol 108 (90 Base) MCG/ACT inhaler Commonly known as: VENTOLIN HFA Inhale 2 puffs into the lungs every 6 (six) hours as needed for wheezing.   ARIPiprazole 10 MG tablet Commonly known as: ABILIFY Take 10 mg by mouth daily.   aspirin EC 81 MG tablet Take 81 mg by mouth every morning.   Breztri Aerosphere 160-9-4.8 MCG/ACT Aero Generic drug: Budeson-Glycopyrrol-Formoterol Inhale 2 puffs into the lungs in the morning and at bedtime.   Caduet 10-20 MG tablet Generic drug: amlodipine-atorvastatin TAKE 1 TABLET BY MOUTH DAILY.   Calcium Carb-Cholecalciferol 600-400 MG-UNIT Tabs Take 1 tablet by mouth daily.   celecoxib 200 MG capsule Commonly known as: CELEBREX Take 200 mg by mouth daily as needed for mild pain.   dexlansoprazole 60 MG capsule Commonly known as: DEXILANT Take 60 mg by mouth daily.   docusate sodium 100 MG capsule Commonly known as: COLACE Take 100 mg by mouth daily as needed for mild constipation.   ferrous sulfate 325 (65 FE) MG tablet Take 325 mg by mouth daily.   Fish  Oil 1000 MG Caps Take 1,000 mg by mouth 2 (two) times daily.   furosemide 20 MG tablet Commonly known as: LASIX Take 1 tablet (20 mg total) by mouth daily.   hydrALAZINE 25 MG tablet Commonly known as: APRESOLINE TAKE 1 TABLET BY MOUTH TWICE A  DAY   ipratropium-albuterol 0.5-2.5 (3) MG/3ML Soln Commonly known as: DUONEB Take 3 mLs by nebulization in the morning and at bedtime.   lamoTRIgine 100 MG tablet Commonly known as: LAMICTAL Take 200 mg by mouth 2 (two) times daily.   montelukast 10 MG tablet Commonly known as: SINGULAIR TAKE 1 TABLET BY MOUTH EVERYDAY AT BEDTIME What changed: See the new instructions.   multivitamin capsule Take 1 capsule by mouth daily.   nitroGLYCERIN 0.4 MG SL tablet Commonly known as: NITROSTAT Place 1 tablet (0.4 mg total) under the tongue every 5 (five) minutes as needed for chest pain.   oxyCODONE-acetaminophen 10-325 MG tablet Commonly known as: PERCOCET Take 1 tablet by mouth every 4 (four) hours as needed for pain.   potassium chloride 8 MEQ tablet Commonly known as: KLOR-CON Take 8 mEq by mouth daily.   Prolia 60 MG/ML Sosy injection Generic drug: denosumab Inject 60 mg into the skin every 6 (six) months.   sertraline 50 MG tablet Commonly known as: ZOLOFT Take 50 mg by mouth daily.   Systane 0.4-0.3 % Gel ophthalmic gel Generic drug: Polyethyl Glycol-Propyl Glycol Place 1 application into both eyes daily as needed (dry eyes).   tiZANidine 4 MG tablet Commonly known as: ZANAFLEX Take 4 mg by mouth 4 (four) times daily as needed for muscle spasms.   valsartan 320 MG tablet Commonly known as: DIOVAN TAKE 1 TABLET BY MOUTH EVERY DAY   Vitamin D3 50 MCG (2000 UT) Tabs Take 2,000 Units by mouth daily.         Signed: Marvis Moeller, DNP, NP-C 09/02/2020, 10:00 AM

## 2020-09-02 NOTE — Discharge Instructions (Addendum)
Inpatient Rehab Discharge Instructions  [name] Discharge date and time: No discharge date for patient encounter.   Activities/Precautions/ Functional Status: Activity: activity as tolerated Diet: regular diet Wound Care: Routine skin checks Functional status:  ___ No restrictions     ___ Walk up steps independently ___ 24/7 supervision/assistance   ___ Walk up steps with assistance ___ Intermittent supervision/assistance  ___ Bathe/dress independently ___ Walk with walker     __x_ Bathe/dress with assistance ___ Walk Independently    ___ Shower independently ___ Walk with assistance    ___ Shower with assistance ___ No alcohol     ___ Return to work/school ________  COMMUNITY REFERRALS UPON DISCHARGE:    Home Health:   PT     OT       RN    SNA                    Agency: [facility]  [id number] *Please expect follow-up within 2-3 days of discharge to schedule your home visit. If you do not receive follow-up, be sure to contact the branch directly.*    Medical Equipment/Items Ordered: no equipment needed has already                                                 Agency/Supplier:    Special Instructions:  No driving smoking or alcohol  My questions have been answered and I understand these instructions. I will adhere to these goals and the provided educational materials after my discharge from the hospital.  Patient/Caregiver Signature _______________________________ Date __________  Clinician Signature _______________________________________ Date __________  Please bring this form and your medication list with you to all your follow-up doctor's appointments.

## 2020-09-03 ENCOUNTER — Inpatient Hospital Stay (HOSPITAL_COMMUNITY): Payer: Medicare Other

## 2020-09-03 DIAGNOSIS — K592 Neurogenic bowel, not elsewhere classified: Secondary | ICD-10-CM

## 2020-09-03 DIAGNOSIS — M7989 Other specified soft tissue disorders: Secondary | ICD-10-CM

## 2020-09-03 DIAGNOSIS — I5032 Chronic diastolic (congestive) heart failure: Secondary | ICD-10-CM

## 2020-09-03 DIAGNOSIS — I10 Essential (primary) hypertension: Secondary | ICD-10-CM | POA: Diagnosis not present

## 2020-09-03 DIAGNOSIS — S14129S Central cord syndrome at unspecified level of cervical spinal cord, sequela: Secondary | ICD-10-CM

## 2020-09-03 LAB — CBC WITH DIFFERENTIAL/PLATELET
Abs Immature Granulocytes: 0.06 10*3/uL (ref 0.00–0.07)
Basophils Absolute: 0 10*3/uL (ref 0.0–0.1)
Basophils Relative: 1 %
Eosinophils Absolute: 0.2 10*3/uL (ref 0.0–0.5)
Eosinophils Relative: 2 %
HCT: 38.7 % (ref 36.0–46.0)
Hemoglobin: 13.1 g/dL (ref 12.0–15.0)
Immature Granulocytes: 1 %
Lymphocytes Relative: 10 %
Lymphs Abs: 0.9 10*3/uL (ref 0.7–4.0)
MCH: 32.4 pg (ref 26.0–34.0)
MCHC: 33.9 g/dL (ref 30.0–36.0)
MCV: 95.8 fL (ref 80.0–100.0)
Monocytes Absolute: 0.8 10*3/uL (ref 0.1–1.0)
Monocytes Relative: 9 %
Neutro Abs: 6.4 10*3/uL (ref 1.7–7.7)
Neutrophils Relative %: 77 %
Platelets: 270 10*3/uL (ref 150–400)
RBC: 4.04 MIL/uL (ref 3.87–5.11)
RDW: 13.8 % (ref 11.5–15.5)
WBC: 8.4 10*3/uL (ref 4.0–10.5)
nRBC: 0 % (ref 0.0–0.2)

## 2020-09-03 LAB — COMPREHENSIVE METABOLIC PANEL
ALT: 16 U/L (ref 0–44)
AST: 20 U/L (ref 15–41)
Albumin: 2.3 g/dL — ABNORMAL LOW (ref 3.5–5.0)
Alkaline Phosphatase: 61 U/L (ref 38–126)
Anion gap: 10 (ref 5–15)
BUN: 7 mg/dL — ABNORMAL LOW (ref 8–23)
CO2: 27 mmol/L (ref 22–32)
Calcium: 8.9 mg/dL (ref 8.9–10.3)
Chloride: 97 mmol/L — ABNORMAL LOW (ref 98–111)
Creatinine, Ser: 0.68 mg/dL (ref 0.44–1.00)
GFR, Estimated: >60 mL/min (ref >60)
Glucose, Bld: 114 mg/dL — ABNORMAL HIGH (ref 70–99)
Potassium: 3.7 mmol/L (ref 3.5–5.1)
Sodium: 134 mmol/L — ABNORMAL LOW (ref 135–145)
Total Bilirubin: 0.6 mg/dL (ref 0.3–1.2)
Total Protein: 5.9 g/dL — ABNORMAL LOW (ref 6.5–8.1)

## 2020-09-03 MED ORDER — TIZANIDINE HCL 2 MG PO TABS
2.0000 mg | ORAL_TABLET | Freq: Four times a day (QID) | ORAL | Status: DC | PRN
  Administered 2020-09-03 – 2020-09-12 (×27): 2 mg via ORAL
  Filled 2020-09-03 (×28): qty 1

## 2020-09-03 MED ORDER — SORBITOL 70 % SOLN
60.0000 mL | Status: AC
  Administered 2020-09-03: 60 mL via ORAL
  Filled 2020-09-03: qty 60

## 2020-09-03 NOTE — Progress Notes (Signed)
Bilateral lower extremity venous duplex completed. Refer to "CV Proc" under chart review to view preliminary results.  09/03/2020 3:46 PM Kelby Aline., MHA, RVT, RDCS, RDMS

## 2020-09-03 NOTE — Progress Notes (Signed)
Inpatient Rehabilitation  Patient information reviewed and entered into eRehab system by Goldye Tourangeau M. Anisa Leanos, M.A., CCC/SLP, PPS Coordinator.  Information including medical coding, functional ability and quality indicators will be reviewed and updated through discharge.    

## 2020-09-03 NOTE — Progress Notes (Addendum)
PROGRESS NOTE   Subjective/Complaints: C/o neck pain.collar ill-fitting. Felt that am session with PT went well. Brother present  ROS: Patient denies fever, rash, sore throat, blurred vision, nausea, vomiting, diarrhea, cough, shortness of breath or chest pain, headache, or mood change.    Objective:   No results found. Recent Labs    09/02/20 1448 09/03/20 0500  WBC 11.5* 8.4  HGB 14.0 13.1  HCT 37.5 38.7  PLT 283 270   Recent Labs    09/02/20 1448 09/03/20 0500  NA  --  134*  K  --  3.7  CL  --  97*  CO2  --  27  GLUCOSE  --  114*  BUN  --  7*  CREATININE 0.71 0.68  CALCIUM  --  8.9    Intake/Output Summary (Last 24 hours) at 09/03/2020 0926 Last data filed at 09/03/2020 0600 Gross per 24 hour  Intake 236 ml  Output 1950 ml  Net -1714 ml        Physical Exam: Vital Signs Blood pressure (!) 150/72, pulse 71, temperature 98.4 F (36.9 C), temperature source Oral, resp. rate 20, height '5\' 3"'$  (1.6 m), weight 87 kg, SpO2 91 %.  General: Alert and oriented x 3, No apparent distress HEENT: Head is normocephalic, atraumatic, PERRLA, EOMI, sclera anicteric, oral mucosa pink and moist, dentition intact, ext ear canals clear,  Neck: cervical collar in place. Too large Heart: Reg rate and rhythm. No murmurs rubs or gallops Chest: CTA bilaterally without wheezes, rales, or rhonchi; no distress Abdomen: Soft, non-tender, non-distended, bowel sounds positive. Extremities: No clubbing, cyanosis, or edema. Pulses are 2+ Psych: Pt's affect is appropriate. Pt is cooperative Skin: cervical wound cdi with honeycomb dressing. A few scattered lacs and abrasions on legs/arms, bruises.  Neuro:  fairly alert, oriented. Follows commands. Able to carry conversation. No CN findings. RUE 4+/5. LUE 4/5, decreased sensation to LT/pain LUE>LUE. LE4- prox to 4/5 distally. No sensory findings in legs. DTR's 3+ Musculoskeletal:  cervicalgia. No limb pain    Assessment/Plan: 1. Functional deficits which require 3+ hours per day of interdisciplinary therapy in a comprehensive inpatient rehab setting. Physiatrist is providing close team supervision and 24 hour management of active medical problems listed below. Physiatrist and rehab team continue to assess barriers to discharge/monitor patient progress toward functional and medical goals  Care Tool:  Bathing              Bathing assist       Upper Body Dressing/Undressing Upper body dressing        Upper body assist      Lower Body Dressing/Undressing Lower body dressing            Lower body assist       Toileting Toileting    Toileting assist Assist for toileting: Dependent - Patient 0%     Transfers Chair/bed transfer  Transfers assist           Locomotion Ambulation   Ambulation assist              Walk 10 feet activity   Assist           Walk  50 feet activity   Assist           Walk 150 feet activity   Assist           Walk 10 feet on uneven surface  activity   Assist           Wheelchair     Assist               Wheelchair 50 feet with 2 turns activity    Assist            Wheelchair 150 feet activity     Assist          Blood pressure (!) 150/72, pulse 71, temperature 98.4 F (36.9 C), temperature source Oral, resp. rate 20, height '5\' 3"'$  (1.6 m), weight 87 kg, SpO2 91 %.     Medical Problem List and Plan: 1.  Gait abnormality secondary to severe spondylitic myelopathy with severe stenosis C2-3 3-4/central cord syndrome.  Status post cervical arthrodesis with posterior cervical bilateral laminectomy 08/28/2020.  Cervical collar as directed             -patient may shower             -ELOS/Goals: Supervision/Min A 17-21 days.             -Continue CIR therapies including PT, OT, and SLP   -requested smaller aspen collar front.  2.   Antithrombotics: -DVT/anticoagulation: Subcutaneous heparin.  Check vascular study Mechanical: Sequential compression devices, below knee Bilateral lower extremities Pharmaceutical: sq hep             -antiplatelet therapy: N/A 3. Pain Management: Oxycodone as needed              Monitor with increased exertion  -dc robaxin d/t sedation.   -tizanidine trial 4. Mood: Zoloft 50 mg daily, Lamictal 200 mg twice daily             -antipsychotic agents: Abilify 10 mg daily  -mood fair at present. Continue to monitor 5. Neuropsych: This patient is?  Fully capable of making decisions on her own behalf. 6. Skin/Wound Care: Routine skin checks 7. Fluids/Electrolytes/Nutrition: Routine in and outs             CMP reviewed. Add protein supp for low albumin 8.  Hypertension.  Hydralazine 25 mg twice daily, Avapro 300 mg daily, Norvasc 10 mg daily             borderline. Tizanidine should help 9.  COPD/history of tobacco use.  Continue inhalers as directed.  Monitor oxygen saturations  -O2 [place] as needed 10.  GERD.  Protonix 11.  CAD with history of stenting.  Plan to discuss with neurosurgery when to resume low-dose aspirin 12.  Diastolic congestive heart failure.  Lasix 20 mg daily.  Monitor for any signs of fluid overload   Filed Weights   09/02/20 1500 09/03/20 0704  Weight: 81.2 kg 87 kg    -weight up today. ? Validity    -monitor daily 13. Neurogenic bowel: no bm since before admission  -sorbitol and sse today 14. Neurogenic bladder:  -voiding trial  -I/O cath prn    LOS: 1 days A FACE TO FACE EVALUATION WAS PERFORMED  Meredith Staggers 09/03/2020, 9:26 AM

## 2020-09-03 NOTE — Plan of Care (Signed)
  Problem: SCI BOWEL ELIMINATION Goal: RH STG SCI MANAGE BOWEL WITH MEDICATION WITH ASSISTANCE Description: STG SCI Manage bowel with medication with MOD I assistance. Outcome: Not Progressing Note: Pt has constipation unrelieved by enema and sorbitol.    Problem: SCI BLADDER ELIMINATION Goal: RH STG MANAGE BLADDER WITH ASSISTANCE Description: STG Manage Bladder With Min Assistance Outcome: Not Progressing Goal: RH STG MANAGE BLADDER WITH MEDICATION WITH ASSISTANCE Description: STG Manage Bladder With Medication With MOD I Assistance. Outcome: Not Progressing Goal: RH STG MANAGE BLADDER WITH EQUIPMENT WITH ASSISTANCE Description: STG Manage Bladder With Equipment With MIN Assistance Outcome: Not Progressing Note: total

## 2020-09-03 NOTE — Evaluation (Signed)
Speech Language Pathology Assessment and Plan  Patient Details  Name: [name] MRN: [id number] Date of Birth: 1945/02/02  SLP Diagnosis: Dysphagia;Cognitive Impairments;Speech and Language deficits  Rehab Potential: Good ELOS: 3 weeks    Today's Date: 09/03/2020 SLP Individual Time: 1100-1150 SLP Individual Time Calculation (min): 50 min   Hospital Problem: Principal Problem:   Central cord syndrome [facility])  Past Medical History:  Past Medical History:  Diagnosis Date   Alcoholism ([place])    recovering since 2000   Anxiety and depression    Arthritis BACK   Bipolar disorder ([place])    Blepharospasm LEFT EYE   CHF exacerbation ([place]) 02/24/2017   Chronic back pain    COPD (chronic obstructive pulmonary disease) ([place])    Coronary artery disease CARDIOLOGIST- DR [place]--- LAST VISIT NOTE 09-07-2009  W/ CHART   Emphysema    GERD (gastroesophageal reflux disease)    History of alcohol abuse RECOVERING SINCE 2000   History of Left renal mass 03/02/2012   underwent partial nephrectomy   HTN (hypertension)    Idiopathic acute facial nerve palsy LEFT SIDE--  BOTOX THERAPY   Inferior MI ([place]) 2000--  POST PTCA W/ STENT X1   Osteoporosis    Other and unspecified general anesthetics causing adverse effect in therapeutic use post op delirium--  last anes record w/ chart  from   09-22-2009 (spinal w/ light sedation)   Peripheral vascular disease ([place]) POST RIGHT CAROTID SURG.  1995   Renal cell carcinoma ([place]) 07/09/12   Left mass   Rosacea LEFT FACIAL RASH   S/P radiation therapy 02/21/2012   38.75 Gy HDR 5 Fractions- vaginal cuff   Scoliosis    Seizures ([facility])    x 1 after abrupt discontinuation of  Clonidine   Status post carotid endarterectomy RIGHT --  1995   Status post primary angioplasty with coronary stent 2000--  POST INFERIOR MI   Unstable balance WALKS W/ CANE   Vaginal cancer [facility])    Past Surgical History:  Past Surgical History:  Procedure Laterality Date   BREAST  EXCISIONAL BIOPSY Left 2019   b9 axilla Bx X 3   CAROTID ENDARTERECTOMY  1995   RIGHT   CATARACT EXTRACTION W/ INTRAOCULAR LENS  IMPLANT, BILATERAL     CERVICAL CONIZATION W/BX  09-23-2008   CORONARY ANGIOPLASTY WITH STENT PLACEMENT  2000-   INFERIOR MI   X1 STENT TO RCA   EUS N/A 03/05/2012   Procedure: FULL UPPER ENDOSCOPIC ULTRASOUND (EUS) RADIAL and EGD;  Surgeon: Milus Banister, MD;  Location: [facility];  Service: Endoscopy;  Laterality: N/A;  ercp scope first than eus scope   HEMIARTHROPLASTY HIP  12-26-2008   LEFT FEMORAL NECK FX   ORIF HIP FRACTURE  02-13-2007   RIGHT FEMORAL NECK FX   ORIF RIGHT DISTAL RADIUS AND RIGHT PROXIMAL HUMEROUS NECK FX'S  10-10-2005   POSTERIOR CERVICAL LAMINECTOMY N/A 08/28/2020   Procedure: Laminectomy and Foraminotomy - Cervical Two-Three, Cervical Three-Four, with lateral mass fusion/ fixation;  Surgeon: Dawley, Theodoro Doing, DO;  Location: [place];  Service: Neurosurgery;  Laterality: N/A;   RIGHT SHOULDER SURG.  2007   ROBOTIC ASSITED PARTIAL NEPHRECTOMY Left 07/09/2012   Procedure: ROBOTIC ASSITED PARTIAL NEPHRECTOMY;  Surgeon: Dutch Gray, MD;  Location: [facility];  Service: Urology;  Laterality: Left;   TOTAL HIP ARTHROPLASTY  04-15-2008   POST FAILED  RIGHT HIP ORIF FEMORAL FX   TOTAL KNEE ARTHROPLASTY  09-22-2009   RIGHT   UPPER RIGHT VAGINAL  REGION  12/28/11   BIOPSY: SQUAMOUS CELL CARCINOMA   VAGINAL HYSTERECTOMY  07/06/2009   Secondary to dysplasia    Assessment / Plan / Recommendation   Patient is a 75 y.o. year old female with history of anxiety/depression./Bipolar disorder diastolic congestive heart failure, COPD quit smoking 10 years ago, alcohol use, history of left renal mass partial nephrectomy 2014 as well as radiation therapy, CAD with stenting maintained on low-dose aspirin, chronic back pain.  History taken from chart review, and patient.  Patient lives with [name]/roommate.  1 level home one-step to entry.  Use a Rollator for home and  community ambulation.  Roommate helps with some cooking and supervises bathing.  She presented on 08/28/2020 after a fall with progressive weakness in bilateral upper extremities and gait abnormality.  She is noted severe numbness in bilateral arms and hands right greater than left.  Admission chemistries unremarkable except glucose 105, hemoglobin 15.3, urine culture 60,000 E. coli/50,000 Aerococcus.  Cranial CT scan unremarkable for fracture.  X-rays and imaging revealed cervical spondylitic myelopathy with severe stenosis C2-3, 3-4/central cord syndrome.  Underwent posterior cervical arthrodesis C2-3, 3-4 with lateral mass instrumentation posterior cervical bilateral laminectomy on 08/28/2020  per Dr. Reatha Armour.  Cervical collar as directed patient can remove when eating and when in bed.  She was cleared to begin subcutaneous heparin for DVT prophylaxis 09/01/2020.  Close monitoring of oxygen needs with increased exertion.  Tolerating a regular diet.  Therapy evaluations completed due to patient decreased functional mobility and self-care and was admitted for a comprehensive rehab program.  Patient transferred to CIR on 09/02/2020 .    Clinical Impression Patient presents with a mild-moderate cognitive impairment, mild oropharyngeal dysphagia and questionable speech-language deficits versus impact from lethargy and fatigue from pain medications. SLP plans to continue assessment of patient's cognition, speech, language abilities. During today's evaluation, she exhibited significant difficulty with even immediate recall of 4 words but was able to recall (after delay), name of PT who worked with her this morning (it is now 11am and he worked with her from 8-9am). She exhibited cognitive processing delays, decreased abstract reasoning, and decreased orientation to time. (correctly stated month and place, but stated year as 2020 and did not know date or day of week). When responding to hypothetical questions,, she would  give 1-2 word responses and would not elaborate unless requested. She did not fully attempt to perform abstract reseasoning despite encouragement. She would frequently forget what she was thinking about, such as when SLP having her perform basic math calculations and she would forget numbers , saying "it leaves me." Patient also exhibited mild dysphagia with difficulty swallowing small medicaiton tablet with water and with SLP suspecting possible swallow initiation delay. SLP discussed baseline function with patient and with her brother and her roomate/friend confirming. [name] stated that she does bills and cooking but patient does manage medications on her own. Patient reports she can walk with walker in house but not able to do so outside. She spends most of time sitting in recliner chair/couch and this is where she sleeps as well. SLP is recommending skilled ST to focus on dysphagia, cognitive and linguistic goals with planned continued assessment.  Skilled Therapeutic Interventions          SLE, BSE  SLP Assessment  Patient will need skilled Speech Lanaguage Pathology Services during CIR admission    Recommendations  SLP Diet Recommendations: Thin;Age appropriate regular solids Liquid Administration via: Cup;Straw Medication Administration: Whole meds with puree Supervision:  Full supervision/cueing for compensatory strategies;Staff to assist with self feeding Compensations: Minimize environmental distractions;Slow rate;Small sips/bites Postural Changes and/or Swallow Maneuvers: Seated upright 90 degrees Oral Care Recommendations: Oral care BID Recommendations for Other Services: Neuropsych consult Patient destination: Home Follow up Recommendations: Other (comment) (TBD)    SLP Frequency 3 to 5 out of 7 days   SLP Duration  SLP Intensity  SLP Treatment/Interventions 3 weeks  Minumum of 1-2 x/day, 30 to 90 minutes  Cognitive remediation/compensation;Dysphagia/aspiration precaution  training;Internal/external aids;Speech/Language facilitation;Medication managment;Environmental controls;Multimodal communication approach;Patient/family education;Functional tasks    Pain Pain Assessment Pain Scale: Faces Pain Score: 10-Worst pain ever Faces Pain Scale: Hurts a little bit Pain Type: Acute pain Pain Location: Back Pain Orientation: Mid;Upper Pain Radiating Towards: neck Pain Descriptors / Indicators: Aching Pain Frequency: Constant Pain Onset: On-going Pain Intervention(s): Medication (See eMAR)  Prior Functioning Cognitive/Linguistic Baseline: Within functional limits Type of Home: House  Lives With: Friend(s) Available Help at Discharge: Friend(s);Available 24 hours/day Education: 12th grade 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))  SLP Evaluation Cognition Overall Cognitive Status: Impaired/Different from baseline Arousal/Alertness: Awake/alert Orientation Level: Oriented to person;Oriented to place;Oriented to situation;Disoriented to time Attention: Sustained Sustained Attention: Impaired Sustained Attention Impairment: Verbal basic;Functional basic Memory: Impaired Memory Impairment: Storage deficit;Decreased recall of new information;Other (comment) (unable to demonstrate immediate recall of 4 words) Immediate Memory Recall: Sock;Blue;Bed Memory Recall Sock: Not able to recall Memory Recall Blue: With Cue Memory Recall Bed: Not able to recall Awareness: Impaired Awareness Impairment: Emergent impairment Problem Solving: Impaired Problem Solving Impairment: Functional basic;Verbal basic Executive Function: Initiating Initiating: Impaired Initiating Impairment: Verbal basic;Functional basic Safety/Judgment: Appears intact  Comprehension Auditory Comprehension Overall Auditory Comprehension: Appears within functional limits for tasks assessed Expression Expression Primary Mode of Expression: Verbal Verbal Expression Overall  Verbal Expression: Impaired Initiation: No impairment Level of Generative/Spontaneous Verbalization: Conversation;Sentence Repetition: No impairment Naming: No impairment Pragmatics: Impairment Impairments: Abnormal affect;Monotone Interfering Components: Attention Effective Techniques: Open ended questions Non-Verbal Means of Communication: Not applicable Written Expression Dominant Hand: Left Written Expression: Not tested Oral Motor Oral Motor/Sensory Function Overall Oral Motor/Sensory Function: Other (comment) (needs further assessment)  Care Tool Care Tool Cognition Expression of Ideas and Wants Expression of Ideas and Wants: Some difficulty - exhibits some difficulty with expressing needs and ideas (e.g, some words or finishing thoughts) or speech is not clear   Understanding Verbal and Non-Verbal Content Understanding Verbal and Non-Verbal Content: Usually understands - understands most conversations, but misses some part/intent of message. Requires cues at times to understand   Memory/Recall Ability *first 3 days only Memory/Recall Ability *first 3 days only: Current season;That he or she is in a hospital/hospital unit      Bedside Swallowing Assessment General Date of Onset: 09/03/20 Previous Swallow Assessment: None found Diet Prior to this Study: Regular;Thin liquids Temperature Spikes Noted: No Respiratory Status: Room air History of Recent Intubation: No Behavior/Cognition: Alert;Cooperative;Pleasant mood Oral Cavity - Dentition: Adequate natural dentition Self-Feeding Abilities: Needs set up;Needs assist;Total assist Patient Positioning: Upright in bed Baseline Vocal Quality: Low vocal intensity Volitional Cough: Weak Volitional Swallow: Able to elicit  Oral Care Assessment   Ice Chips   Thin Liquid Thin Liquid: Impaired Presentation: Straw Pharyngeal  Phase Impairments: Suspected delayed Swallow Nectar Thick   Honey Thick   Puree Puree: Not  tested Solid Solid: Impaired Other Comments: decreased bolus cohesion with thin liquids and tablet BSE Assessment    Short Term Goals: Week 1: SLP Short Term Goal 1 (Week 1): Patient will  consume current diet (Regular solids, thin liquids) without significant oral difficulty and without overt s/s aspiration or penetration. SLP Short Term Goal 2 (Week 1): Patient will demonstrate recall of daily events and short term recall with modA cues. SLP Short Term Goal 3 (Week 1): Patient will demonstrate awareness to errors during functional tasks, with minA cues. SLP Short Term Goal 4 (Week 1): Patient will demonstrate adequate problem solving to complete functional and simulated ADL tasks with min-modA cues. SLP Short Term Goal 5 (Week 1): Patient will orient to time/date, with minA cues for using memory aids/compensatory strategies. SLP Short Term Goal 6 (Week 1): Patient will elaborate, fully respond to open-ended questions with modA cues to do so.  Refer to Care Plan for Long Term Goals  Recommendations for other services: Neuropsych  Discharge Criteria: Patient will be discharged from SLP if patient refuses treatment 3 consecutive times without medical reason, if treatment goals not met, if there is a change in medical status, if patient makes no progress towards goals or if patient is discharged from hospital.  The above assessment, treatment plan, treatment alternatives and goals were discussed and mutually agreed upon: by patient and by family  Sonia Baller, MA, CCC-SLP Speech Therapy

## 2020-09-03 NOTE — Evaluation (Signed)
Occupational Therapy Assessment and Plan  Patient Details  Name: [name] MRN: [id number] Date of Birth: May 19, 1945  OT Diagnosis: abnormal posture, acute pain, cognitive deficits, muscle weakness (generalized), and quadriplegia at level C2-4 Rehab Potential:   ELOS: 3 weeks   Today's Date: 09/03/2020 OT Individual Time: 1500-1525   1300-1340 OT Individual Time Calculation (min): 25 min   &  40 min  Hospital Problem: Principal Problem:   Central cord syndrome [facility])   Past Medical History:  Past Medical History:  Diagnosis Date   Alcoholism ([place])    recovering since 2000   Anxiety and depression    Arthritis BACK   Bipolar disorder ([place])    Blepharospasm LEFT EYE   CHF exacerbation ([place]) 02/24/2017   Chronic back pain    COPD (chronic obstructive pulmonary disease) ([place])    Coronary artery disease CARDIOLOGIST- DR [place]--- LAST VISIT NOTE 09-07-2009  W/ CHART   Emphysema    GERD (gastroesophageal reflux disease)    History of alcohol abuse RECOVERING SINCE 2000   History of Left renal mass 03/02/2012   underwent partial nephrectomy   HTN (hypertension)    Idiopathic acute facial nerve palsy LEFT SIDE--  BOTOX THERAPY   Inferior MI ([place]) 2000--  POST PTCA W/ STENT X1   Osteoporosis    Other and unspecified general anesthetics causing adverse effect in therapeutic use post op delirium--  last anes record w/ chart  from   09-22-2009 (spinal w/ light sedation)   Peripheral vascular disease ([place]) POST RIGHT CAROTID SURG.  1995   Renal cell carcinoma ([place]) 07/09/12   Left mass   Rosacea LEFT FACIAL RASH   S/P radiation therapy 02/21/2012   38.75 Gy HDR 5 Fractions- vaginal cuff   Scoliosis    Seizures ([facility])    x 1 after abrupt discontinuation of  Clonidine   Status post carotid endarterectomy RIGHT --  1995   Status post primary angioplasty with coronary stent 2000--  POST INFERIOR MI   Unstable balance WALKS W/ CANE   Vaginal cancer [facility])    Past Surgical History:   Past Surgical History:  Procedure Laterality Date   BREAST EXCISIONAL BIOPSY Left 2019   b9 axilla Bx X 3   CAROTID ENDARTERECTOMY  1995   RIGHT   CATARACT EXTRACTION W/ INTRAOCULAR LENS  IMPLANT, BILATERAL     CERVICAL CONIZATION W/BX  09-23-2008   CORONARY ANGIOPLASTY WITH STENT PLACEMENT  2000-   INFERIOR MI   X1 STENT TO RCA   EUS N/A 03/05/2012   Procedure: FULL UPPER ENDOSCOPIC ULTRASOUND (EUS) RADIAL and EGD;  Surgeon: Milus Banister, MD;  Location: [facility];  Service: Endoscopy;  Laterality: N/A;  ercp scope first than eus scope   HEMIARTHROPLASTY HIP  12-26-2008   LEFT FEMORAL NECK FX   ORIF HIP FRACTURE  02-13-2007   RIGHT FEMORAL NECK FX   ORIF RIGHT DISTAL RADIUS AND RIGHT PROXIMAL HUMEROUS NECK FX'S  10-10-2005   POSTERIOR CERVICAL LAMINECTOMY N/A 08/28/2020   Procedure: Laminectomy and Foraminotomy - Cervical Two-Three, Cervical Three-Four, with lateral mass fusion/ fixation;  Surgeon: Dawley, Theodoro Doing, DO;  Location: [place];  Service: Neurosurgery;  Laterality: N/A;   RIGHT SHOULDER SURG.  2007   ROBOTIC ASSITED PARTIAL NEPHRECTOMY Left 07/09/2012   Procedure: ROBOTIC ASSITED PARTIAL NEPHRECTOMY;  Surgeon: Dutch Gray, MD;  Location: [facility];  Service: Urology;  Laterality: Left;   TOTAL HIP ARTHROPLASTY  04-15-2008   POST FAILED  RIGHT HIP ORIF FEMORAL  FX   TOTAL KNEE ARTHROPLASTY  09-22-2009   RIGHT   UPPER RIGHT VAGINAL REGION  12/28/11   BIOPSY: SQUAMOUS CELL CARCINOMA   VAGINAL HYSTERECTOMY  07/06/2009   Secondary to dysplasia    Assessment & Plan Clinical Impression: Patient is a 75 y.o. year old female with history of anxiety/depression./Bipolar disorder diastolic congestive heart failure, COPD quit smoking 10 years ago, alcohol use, history of left renal mass partial nephrectomy 2014 as well as radiation therapy, CAD with stenting maintained on low-dose aspirin, chronic back pain.  History taken from chart review, and patient.  Patient lives with  [name]/roommate.  1 level home one-step to entry.  Use a Rollator for home and community ambulation.  Roommate helps with some cooking and supervises bathing.  She presented on 08/28/2020 after a fall with progressive weakness in bilateral upper extremities and gait abnormality.  She is noted severe numbness in bilateral arms and hands right greater than left.  Admission chemistries unremarkable except glucose 105, hemoglobin 15.3, urine culture 60,000 E. coli/50,000 Aerococcus.  Cranial CT scan unremarkable for fracture.  X-rays and imaging revealed cervical spondylitic myelopathy with severe stenosis C2-3, 3-4/central cord syndrome.  Underwent posterior cervical arthrodesis C2-3, 3-4 with lateral mass instrumentation posterior cervical bilateral laminectomy on 08/28/2020  per Dr. Reatha Armour.  Cervical collar as directed patient can remove when eating and when in bed.  She was cleared to begin subcutaneous heparin for DVT prophylaxis 09/01/2020.  Close monitoring of oxygen needs with increased exertion.  Tolerating a regular diet.  Therapy evaluations completed due to patient decreased functional mobility and self-care and was admitted for a comprehensive rehab program.  Patient transferred to CIR on 09/02/2020 .    Patient currently requires total with basic self-care skills secondary to muscle weakness and muscle paralysis, decreased cardiorespiratoy endurance and decreased oxygen support, unbalanced muscle activation, decreased coordination, and decreased motor planning, decreased problem solving, decreased safety awareness, and decreased memory, and decreased sitting balance, decreased standing balance, decreased postural control, and decreased balance strategies.  Prior to hospitalization, patient could complete ADL with supervision.  Patient will benefit from skilled intervention to decrease level of assist with basic self-care skills and increase independence with basic self-care skills prior to discharge home  with care partner.  Anticipate patient will require 24 hour supervision and minimal physical assistance and follow up home health.  OT - End of Session Activity Tolerance: Tolerates < 10 min activity, no significant change in vital signs Endurance Deficit: Yes Endurance Deficit Description: unable to stay awake during scheduled session, resumed session later in the day with improved level of alertness, fatigue with light activity/change of position OT Assessment OT Patient demonstrates impairments in the following area(s): Balance;Cognition;Endurance;Motor;Pain;Safety;Vision OT Basic ADL's Functional Problem(s): Eating;Grooming;Bathing;Dressing;Toileting OT Transfers Functional Problem(s): Toilet;Tub/Shower OT Additional Impairment(s): Fuctional Use of Upper Extremity OT Plan OT Intensity: Minimum of 1-2 x/day, 45 to 90 minutes OT Frequency: 5 out of 7 days OT Duration/Estimated Length of Stay: 3 weeks OT Treatment/Interventions: Balance/vestibular training;Neuromuscular re-education;Self Care/advanced ADL retraining;Cognitive remediation/compensation;DME/adaptive equipment instruction;Pain management;UE/LE Strength taining/ROM;Community reintegration;Patient/family education;UE/LE Coordination activities;Discharge planning;Functional mobility training;Therapeutic Activities;Visual/perceptual remediation/compensation OT Self Feeding Anticipated Outcome(s): set up OT Basic Self-Care Anticipated Outcome(s): supervision/min a OT Toileting Anticipated Outcome(s): min A OT Bathroom Transfers Anticipated Outcome(s): min A/CGA OT Recommendation Patient destination: Home Follow Up Recommendations: Home health OT Equipment Recommended: 3 in 1 bedside comode;Tub/shower bench   OT Evaluation Precautions/Restrictions  Precautions Precautions: Cervical;Fall Precaution Comments: watch O2 sats Required Braces or Orthoses: Cervical Brace Cervical  Brace: Hard collar;At all times Restrictions Weight  Bearing Restrictions: No Vital Signs Therapy Vitals Temp: 98.5 F (36.9 C) Temp Source: Oral Pulse Rate: (!) 57 Resp: 20 BP: (!) 102/47 Patient Position (if appropriate): Lying Oxygen Therapy SpO2: 98 % O2 Device: Room Air Pain Pain Assessment Pain Scale: 0-10 Pain Score: 9  Pain Type: Acute pain Pain Location: Back Pain Orientation: Mid;Upper Pain Descriptors / Indicators: Aching Pain Frequency: Constant Pain Onset: On-going Pain Intervention(s): Repositioned Home Living/Prior Functioning Home Living Available Help at Discharge: Friend(s), Available 24 hours/day Type of Home: House Home Access: Stairs to enter Technical brewer of Steps: 1-2 Entrance Stairs-Rails: None Home Layout: One level Bathroom Shower/Tub: Chiropodist: Handicapped height Bathroom Accessibility: Yes  Lives With: Friend(s) Prior Function Level of Independence: Needs assistance with ADLs, Requires assistive device for independence, Independent with transfers  Able to Take Stairs?: Yes Driving: No Vision Baseline Vision/History: Wears glasses Wears Glasses: At all times (patient has progressive lenses with prisms that she does not wear often, uses reading glasses at times) Patient Visual Report: Blurring of vision Vision Assessment?: Vision impaired- to be further tested in functional context Additional Comments: patient with significant fatigue limiting ability to participate in full vision evaluation - fields assessed and grossly intact, glasses not available during eval Perception  Perception: Within Functional Limits Praxis Praxis: Intact Cognition Overall Cognitive Status: Impaired/Different from baseline Arousal/Alertness: Awake/alert Orientation Level: Person;Situation Year: 2022 Month: August Day of Week: Incorrect Memory: Impaired Memory Impairment: Decreased recall of new information Immediate Memory Recall: Sock;Blue;Bed Memory Recall Sock: Not able to  recall Memory Recall Blue: With Cue Memory Recall Bed: Not able to recall Awareness: Appears intact Problem Solving: Impaired Problem Solving Impairment: Functional basic Safety/Judgment: Appears intact Sensation Sensation Light Touch: Appears Intact Hot/Cold: Appears Intact Coordination Gross Motor Movements are Fluid and Coordinated: No Fine Motor Movements are Fluid and Coordinated: No Coordination and Movement Description: Limited by weakness Finger Nose Finger Test: moderate dysmetria bilaterally Motor  Motor Motor: Tetraplegia Motor - Skilled Clinical Observations: BUEs weaker than BLEs  Trunk/Postural Assessment  Postural Control Postural Control: Deficits on evaluation Trunk Control: lateral lean in stance, anterior lean in sitting - pain contributing Postural Limitations: kyphotic posture  Balance Balance Balance Assessed: Yes Static Sitting Balance Static Sitting - Level of Assistance: 4: Min assist Dynamic Sitting Balance Dynamic Sitting - Level of Assistance: 3: Mod assist Static Standing Balance Static Standing - Balance Support: During functional activity;Bilateral upper extremity supported Static Standing - Level of Assistance: 3: Mod assist Dynamic Standing Balance Dynamic Standing - Balance Support: During functional activity;Bilateral upper extremity supported Dynamic Standing - Level of Assistance: 3: Mod assist Extremity/Trunk Assessment RUE Assessment Passive Range of Motion (PROM) Comments: shoulder flex/abd 90, ER WFL, distal WFL Active Range of Motion (AROM) Comments: shoulder 1/4, elbow 3/4 to full, forearm/wrist 1/2-3/4, full grasp LUE Assessment Passive Range of Motion (PROM) Comments: shoulder flex/abd 120, distal WFL Active Range of Motion (AROM) Comments: shoulder 1/4, elbow - hand 3/4 to full  Care Tool Care Tool Self Care Eating   Eating Assist Level: Maximal Assistance - Patient 25 - 49%    Oral Care    Oral Care Assist Level:  Maximal assistance - Patient 25 - 49%    Bathing     Body parts bathed by helper: Right arm;Left arm;Chest;Abdomen;Front perineal area;Buttocks;Right upper leg;Left upper leg;Right lower leg;Left lower leg;Face   Assist Level: Dependent - Patient 0%    Upper Body Dressing(including orthotics)  What is the patient wearing?: [place] only   Assist Level: Total Assistance - Patient < 25%    Lower Body Dressing (excluding footwear)   What is the patient wearing?: Incontinence brief Assist for lower body dressing: Dependent - Patient 0%    Putting on/Taking off footwear   What is the patient wearing?: Ted hose;Non-skid slipper socks Assist for footwear: Dependent - Patient 0%       Care Tool Toileting Toileting activity   Assist for toileting: 2 Helpers     Care Tool Bed Mobility Roll left and right activity        Sit to lying activity        Lying to sitting edge of bed activity         Care Tool Transfers Sit to stand transfer        Chair/bed transfer         Toilet transfer   Assist Level: Maximal Assistance - Patient 24 - 49%     Care Tool Cognition Expression of Ideas and Wants Expression of Ideas and Wants: Some difficulty - exhibits some difficulty with expressing needs and ideas (e.g, some words or finishing thoughts) or speech is not clear   Understanding Verbal and Non-Verbal Content Understanding Verbal and Non-Verbal Content: Usually understands - understands most conversations, but misses some part/intent of message. Requires cues at times to understand   Memory/Recall Ability *first 3 days only Memory/Recall Ability *first 3 days only: Current season;That he or she is in a hospital/hospital unit    Refer to Care Plan for [place] 1 OT Short Term Goal 1 (Week 1): patient will complete bed mobility and SPT with mod A OT Short Term Goal 2 (Week 1): patient will complete eating, grooming and upper body adl with mod  A using AD/built up handles OT Short Term Goal 3 (Week 1): patient will tolerate unsupported sitting with min A OT Short Term Goal 4 (Week 1): patient will complete toileting max A of one  Recommendations for other services: None    Skilled Therapeutic Intervention  Patient in bed having difficulty keeping her eyes open or answering questions during scheduled session.  [name], present for session.  O2 via [place].  Evaluation initiated and adl tasks performed with patient having significant difficulty participating due to level of alertness.  Ended session and resumed later in the afternoon at which time she was alert and able to sit edge of bed, transfer to w/c and participate in evaluation and additional adl tasks as documented above and below.   She presents with balance, motor, cognitive and endurance deficits limiting all aspects of self care and mobility.  Reviewed role of OT, plan of care, safety, and goals for therapy.  SPT transfer from w/c to bed at close of session with mod/max A.  Sit to supine max A.  She remained in bed at close of session with nursing present for care.    ADL ADL Eating: Maximal assistance Where Assessed-Eating: Wheelchair Grooming: Maximal assistance Where Assessed-Grooming: Wheelchair Upper Body Bathing: Dependent Where Assessed-Upper Body Bathing: Bed level Lower Body Bathing: Dependent Where Assessed-Lower Body Bathing: Bed level Upper Body Dressing: Dependent Where Assessed-Upper Body Dressing: Bed level Lower Body Dressing: Dependent Where Assessed-Lower Body Dressing: Bed level Toileting: Dependent Where Assessed-Toileting: Bed level ADL Comments: Patient with extreme fatigue and difficulty participating during evaluation Mobility  Bed Mobility Bed Mobility: Supine to Sit;Sit to Supine Supine to Sit: Maximal  Assistance - Patient - Patient 25-49% Sit to Supine: Maximal Assistance - Patient 25-49% Transfers Sit to Stand: Moderate Assistance -  Patient 50-74% Stand to Sit: Moderate Assistance - Patient 50-74%   Discharge Criteria: Patient will be discharged from OT if patient refuses treatment 3 consecutive times without medical reason, if treatment goals not met, if there is a change in medical status, if patient makes no progress towards goals or if patient is discharged from hospital.  The above assessment, treatment plan, treatment alternatives and goals were discussed and mutually agreed upon: by patient and by family  Carlos Levering 09/03/2020, 4:11 PM

## 2020-09-03 NOTE — Progress Notes (Signed)
Occupational Therapy Session Note  Patient Details  Name: [name] MRN: [id number] Date of Birth: [birth year]  Today's Date: 09/03/2020 OT Individual Time: 1400-1415 OT Individual Time Calculation (min): 15 min  and Today's Date: 09/03/2020 OT Missed Time: 15 Minutes Missed Time Reason: Patient fatigue (lethargy)   Short Term Goals: Week 1:     Skilled Therapeutic Interventions/Progress Updates:    OT intervention initiated following consult with evaluating OTR regarding POC and evaluation. Pt sleeping upon arrival with friend present. [employer] representative present changing out Designer, multimedia for [employer] collar. Assisted with change. Pt uttered during this procedure to "please don't hurt me." Pt did not respond to any additional questions and her eyes remained closed. Discussed with friend [name]) the role of OT and that this OTA would be one of the primary therapists. Pt missed 15 mins skilled OT services.   Therapy Documentation Precautions:  Precautions Precautions: Cervical, Fall Required Braces or Orthoses: Cervical Brace Cervical Brace: Hard collar, At all times Restrictions Weight Bearing Restrictions: No General: General OT Amount of Missed Time: 15 Minutes   Therapy/Group: Individual Therapy  Leroy Libman 09/03/2020, 2:23 PM

## 2020-09-03 NOTE — Progress Notes (Signed)
Patient told the NT that she needed something for pain. When this nurse came into the room, patient c/o not being able to breathe. Checked oxygen saturation & it was at 90%. Tried repositioning, but patient just asked to go back to the same position. She then stated that she was hurting. Her prn pain medication was given. While giving her the medication, her oxygen saturation went down to 88% briefly. No other signs of distress is noted. Will continue to monitor

## 2020-09-03 NOTE — Evaluation (Signed)
Physical Therapy Assessment and Plan  Patient Details  Name: [name] MRN: [id number] Date of Birth: 03-04-1945  PT Diagnosis: Abnormal posture, Abnormality of gait, Difficulty walking, and Muscle weakness Rehab Potential: Good ELOS: 3-3.5 weeks   Today's Date: 09/03/2020 PT Individual Time: 4695-8487 PT Individual Time Calculation (min): 56 min    Hospital Problem: Principal Problem:   Central cord syndrome [facility])   Past Medical History:  Past Medical History:  Diagnosis Date   Alcoholism (HCC)    recovering since 2000   Anxiety and depression    Arthritis BACK   Bipolar disorder (HCC)    Blepharospasm LEFT EYE   CHF exacerbation (HCC) 02/24/2017   Chronic back pain    COPD (chronic obstructive pulmonary disease) (HCC)    Coronary artery disease CARDIOLOGIST- DR [place]--- LAST VISIT NOTE 09-07-2009  W/ CHART   Emphysema    GERD (gastroesophageal reflux disease)    History of alcohol abuse RECOVERING SINCE 2000   History of Left renal mass 03/02/2012   underwent partial nephrectomy   HTN (hypertension)    Idiopathic acute facial nerve palsy LEFT SIDE--  BOTOX THERAPY   Inferior MI (HCC) 2000--  POST PTCA W/ STENT X1   Osteoporosis    Other and unspecified general anesthetics causing adverse effect in therapeutic use post op delirium--  last anes record w/ chart  from   09-22-2009 (spinal w/ light sedation)   Peripheral vascular disease (HCC) POST RIGHT CAROTID SURG.  1995   Renal cell carcinoma (HCC) 07/09/12   Left mass   Rosacea LEFT FACIAL RASH   S/P radiation therapy 02/21/2012   38.75 Gy HDR 5 Fractions- vaginal cuff   Scoliosis    Seizures (HCC)    x 1 after abrupt discontinuation of  Clonidine   Status post carotid endarterectomy RIGHT --  1995   Status post primary angioplasty with coronary stent 2000--  POST INFERIOR MI   Unstable balance WALKS W/ CANE   Vaginal cancer [name])    Past Surgical History:  Past Surgical History:  Procedure Laterality Date    BREAST EXCISIONAL BIOPSY Left 2019   b9 axilla Bx X 3   CAROTID ENDARTERECTOMY  1995   RIGHT   CATARACT EXTRACTION W/ INTRAOCULAR LENS  IMPLANT, BILATERAL     CERVICAL CONIZATION W/BX  09-23-2008   CORONARY ANGIOPLASTY WITH STENT PLACEMENT  2000-   INFERIOR MI   X1 STENT TO RCA   EUS N/A 03/05/2012   Procedure: FULL UPPER ENDOSCOPIC ULTRASOUND (EUS) RADIAL and EGD;  Surgeon: Rachael Fee, MD;  Location: [facility] END[facility];  Service: Endoscopy;  Laterality: N/A;  ercp scope first than eus scope   HEMIARTHROPLASTY HIP  12-26-2008   LEFT FEMORAL NECK FX   ORIF HIP FRACTURE  02-13-2007   RIGHT FEMORAL NECK FX   ORIF RIGHT DISTAL RADIUS AND RIGHT PROXIMAL HUMEROUS NECK FX'S  10-10-2005   POSTERIOR CERVICAL LAMINECTOMY N/A 08/28/2020   Procedure: Laminectomy and Foraminotomy - Cervical Two-Three, Cervical Three-Four, with lateral mass fusion/ fixation;  Surgeon: Dawley, Alan Mulder, DO;  Location: [facility] OR;  Service: Neurosurgery;  Laterality: N/A;   RIGHT SHOULDER SURG.  2007   ROBOTIC ASSITED PARTIAL NEPHRECTOMY Left 07/09/2012   Procedure: ROBOTIC ASSITED PARTIAL NEPHRECTOMY;  Surgeon: Crecencio Mc, MD;  Location: [facility] ORS;  Service: Urology;  Laterality: Left;   TOTAL HIP ARTHROPLASTY  04-15-2008   POST FAILED  RIGHT HIP ORIF FEMORAL FX   TOTAL KNEE ARTHROPLASTY  09-22-2009   RIGHT  UPPER RIGHT VAGINAL REGION  12/28/11   BIOPSY: SQUAMOUS CELL CARCINOMA   VAGINAL HYSTERECTOMY  07/06/2009   Secondary to dysplasia    Assessment & Plan Clinical Impression: Patient is a 75 year old right-handed female with history of anxiety/depression./Bipolar disorder diastolic congestive heart failure, COPD quit smoking 10 years ago, alcohol use, history of left renal mass partial nephrectomy 2014 as well as radiation therapy, CAD with stenting maintained on low-dose aspirin, chronic back pain.  History taken from chart review, and patient.  Patient lives with [name]/roommate.  1 level home one-step to entry.  Use a  Rollator for home and community ambulation.  Roommate helps with some cooking and supervises bathing.  She presented on 08/28/2020 after a fall with progressive weakness in bilateral upper extremities and gait abnormality.  She is noted severe numbness in bilateral arms and hands right greater than left.  Admission chemistries unremarkable except glucose 105, hemoglobin 15.3, urine culture 60,000 E. coli/50,000 Aerococcus.  Cranial CT scan unremarkable for fracture.  X-rays and imaging revealed cervical spondylitic myelopathy with severe stenosis C2-3, 3-4/central cord syndrome.  Underwent posterior cervical arthrodesis C2-3, 3-4 with lateral mass instrumentation posterior cervical bilateral laminectomy on 08/28/2020  per Dr. Reatha Armour.  Cervical collar as directed patient can remove when eating and when in bed.  She was cleared to begin subcutaneous heparin for DVT prophylaxis 09/01/2020.  Close monitoring of oxygen needs with increased exertion.  Tolerating a regular diet. Patient transferred to CIR on 09/02/2020 .   Patient currently requires max with mobility secondary to muscle weakness, decreased cardiorespiratoy endurance and decreased oxygen support, unbalanced muscle activation, and decreased sitting balance, decreased standing balance, decreased postural control, and decreased balance strategies.  Prior to hospitalization, patient was modified independent  with mobility and lived with Friend(s) in a House home.  Home access is 1-2Stairs to enter.  Patient will benefit from skilled PT intervention to maximize safe functional mobility, minimize fall risk, and decrease caregiver burden for planned discharge home with 24 hour supervision.  Anticipate patient will benefit from follow up [place] at discharge.  PT - End of Session Activity Tolerance: Tolerates 10 - 20 min activity with multiple rests Endurance Deficit: Yes Endurance Deficit Description: Requires frequent rest breaks PT Assessment Rehab Potential  (ACUTE/IP ONLY): Good PT Barriers to Discharge: Home environment access/layout PT Patient demonstrates impairments in the following area(s): Balance;Endurance;Motor;Pain;Safety PT Transfers Functional Problem(s): Bed Mobility;Bed to Chair;Car;Furniture PT Locomotion Functional Problem(s): Ambulation;Stairs PT Plan PT Intensity: Minimum of 1-2 x/day ,45 to 90 minutes PT Frequency: 5 out of 7 days PT Duration Estimated Length of Stay: 3-3.5 weeks PT Treatment/Interventions: Ambulation/gait training;Community reintegration;DME/adaptive equipment instruction;Neuromuscular re-education;Psychosocial support;Stair training;UE/LE Strength taining/ROM;Wheelchair propulsion/positioning;Balance/vestibular training;Discharge planning;Functional electrical stimulation;Pain management;Skin care/wound management;Therapeutic Activities;UE/LE Coordination activities;Cognitive remediation/compensation;Disease management/prevention;Functional mobility training;Patient/family education;Splinting/orthotics;Therapeutic Exercise;Visual/perceptual remediation/compensation PT Transfers Anticipated Outcome(s): Supervision PT Locomotion Anticipated Outcome(s): Supervision PT Recommendation Follow Up Recommendations: Home health PT;24 hour supervision/assistance Patient destination: Home Equipment Recommended: To be determined   PT Evaluation Precautions/Restrictions Precautions Precautions: Cervical;Fall Required Braces or Orthoses: Cervical Brace Cervical Brace: Hard collar;At all times Restrictions Weight Bearing Restrictions: No General Chart Reviewed: Yes Family/Caregiver Present: Yes Vital Signs Pain Pain Assessment Pain Scale: 0-10 Pain Score: 10-Worst pain ever Pain Type: Surgical pain Pain Location: Neck Pain Orientation: Right;Left Pain Descriptors / Indicators: Constant;Aching Pain Frequency: Constant Pain Onset: On-going Pain Intervention(s): Medication (See eMAR) Home Living/Prior  Functioning Home Living Available Help at Discharge: Friend(s);Available 24 hours/day Type of Home: House Home Access: Stairs to enter [employer] of  Steps: 1-2 Entrance Stairs-Rails: None Home Layout: One level Bathroom Shower/Tub: Tub/shower unit  Lives With: Friend(s) Prior Function Level of Independence: Needs assistance with ADLs;Requires assistive device for independence  Able to Take Stairs?: Yes Driving: No Vision/Perception  Perception Perception: Within Functional Limits Praxis Praxis: Intact  Cognition Overall Cognitive Status: Impaired/Different from baseline Arousal/Alertness: Awake/alert Orientation Level: Oriented to person;Oriented to place;Oriented to situation Safety/Judgment: Appears intact Sensation Sensation Light Touch: Appears Intact Coordination Gross Motor Movements are Fluid and Coordinated: No Fine Motor Movements are Fluid and Coordinated: No Coordination and Movement Description: Limited by weakness Motor  Motor Motor: Tetraplegia Motor - Skilled Clinical Observations: BUEs weaker than BLEs  Trunk/Postural Assessment  Cervical Assessment Cervical Assessment:  (Forward head, cervical brace) Thoracic Assessment Thoracic Assessment:  (kyphosis) Lumbar Assessment Lumbar Assessment:  (posterior pelvic tilt) Postural Control Postural Control: Deficits on evaluation (Delayed and inadequate)  Balance Balance Balance Assessed: Yes Static Sitting Balance Static Sitting - Balance Support: Feet supported Static Sitting - Level of Assistance: 4: Min assist Dynamic Sitting Balance Dynamic Sitting - Balance Support: Feet supported Dynamic Sitting - Level of Assistance: 3: Mod assist Static Standing Balance Static Standing - Balance Support: During functional activity;Bilateral upper extremity supported Static Standing - Level of Assistance: 3: Mod assist Dynamic Standing Balance Dynamic Standing - Balance Support: During functional  activity;Bilateral upper extremity supported Dynamic Standing - Level of Assistance: 3: Mod assist Extremity Assessment  RLE Assessment General Strength Comments: Grossly 4/5 LLE Assessment General Strength Comments: Grossly 4/5  Care Tool Care Tool Bed Mobility Roll left and right activity   Roll left and right assist level: Maximal Assistance - Patient 25 - 49%    Sit to lying activity   Sit to lying assist level: Maximal Assistance - Patient 25 - 49%    Lying to sitting edge of bed activity   Lying to sitting edge of bed assist level: Maximal Assistance - Patient 25 - 49%     Care Tool Transfers Sit to stand transfer   Sit to stand assist level: Moderate Assistance - Patient 50 - 74%    Chair/bed transfer   Chair/bed transfer assist level: Moderate Assistance - Patient 50 - 74%     Toilet transfer   Assist Level: Moderate Assistance - Patient 50 - 74%    Car transfer          Care Tool Locomotion Ambulation   Assist level: 2 helpers Assistive device: Walker-rolling Max distance: 15'  Walk 10 feet activity   Assist level: 2 helpers Assistive device: Walker-rolling   Walk 50 feet with 2 turns activity Walk 50 feet with 2 turns activity did not occur: Safety/medical concerns      Walk 150 feet activity Walk 150 feet activity did not occur: Safety/medical concerns      Walk 10 feet on uneven surfaces activity Walk 10 feet on uneven surfaces activity did not occur: Safety/medical concerns      Stairs   Assist level: 2 helpers Stairs assistive device: 2 hand rails Max number of stairs: 2  Walk up/down 1 step activity   Walk up/down 1 step (curb) assist level: 2 helpers   Walk up/down 4 steps activity did not occuR: Safety/medical concerns  Walk up/down 4 steps activity      Walk up/down 12 steps activity Walk up/down 12 steps activity did not occur: Safety/medical concerns      Pick up small objects from floor Pick up small object from the floor (from  standing  position) activity did not occur: Safety/medical concerns      Wheelchair Will patient use wheelchair at discharge?: No          Wheel 50 feet with 2 turns activity      Wheel 150 feet activity        Refer to Care Plan for Long Term Goals  SHORT TERM GOAL WEEK 1 PT Short Term Goal 1 (Week 1): Pt will perform bed mobility consistently with modA. PT Short Term Goal 2 (Week 1): Pt will peform bed to chair transfer with minA. PT Short Term Goal 3 (Week 1): Pt will ambulate x50' with modA +1 and LRAD.  Recommendations for other services: None   Skilled Therapeutic Intervention  Evaluation completed (see details above and below) with education on PT POC and goals and individual treatment initiated with focus on bed mobility, balance, functional transfers, ambulation, and stair training. Pt received supine in bed and agrees to therapy, though reports 10/10 pain in neck and back. RN present in room and reports pt had just received 10 mg oxycodone. PT provides bracing and rest breaks to manage pain. Aspen collar donned with totalA while supine, though brace appears too large and has to be repositioned multiple times throughout session. Pt performs supine to sit with maxA. Seated at EOB, pt loses balance several times posteriorly, requiring minA for safety. Pt performs stand step transfer from bed to [facility] with modA. WC transport to gym for time management. Pt performs car transfer with maxA HHA and cues for sequencing and positioning. Following seated rest break, pt performs sit to stand with modA and ambulates x15' with modA +1 and RW and +2 WC follow. Pt then performs x2 3" steps with bilateral hand rails and maxA. Pt has significant posterior bias while performing steps, and requires +2 for WC management at bottom of steps for safety. WC transport back to room. Stand pivot back to bed with modA. MaxA for sit to supine and pt left with alarm intact and all needs within reach.  PT contacts  Scott from [place] to evaluate pt's neck brace for improved fit and Nicki Reaper reports that he delivered [facility] cervical collar specific for kyphotic postures later in day.   Mobility Bed Mobility Bed Mobility: Supine to Sit;Sit to Supine Supine to Sit: Maximal Assistance - Patient - Patient 25-49% Sit to Supine: Maximal Assistance - Patient 25-49% Transfers Transfers: Sit to Stand;Stand Pivot Transfers;Stand to Sit Sit to Stand: Moderate Assistance - Patient 50-74% Stand to Sit: Moderate Assistance - Patient 50-74% Stand Pivot Transfers: Moderate Assistance - Patient 50 - 74% Stand Pivot Transfer Details: Tactile cues for posture;Tactile cues for initiation;Verbal cues for sequencing;Verbal cues for technique;Tactile cues for sequencing;Manual facilitation for weight shifting Transfer (Assistive device): 1 person hand held assist Locomotion  Gait Ambulation: Yes Gait Assistance: 2 Helpers (WC follow) Gait Distance (Feet): 15 Feet Assistive device: Rolling walker Gait Assistance Details: Tactile cues for initiation;Tactile cues for posture;Verbal cues for sequencing;Verbal cues for gait pattern;Verbal cues for safe use of DME/AE;Verbal cues for technique;Verbal cues for precautions/safety Gait Gait: Yes Gait Pattern: Impaired Gait Pattern: Trunk flexed;Shuffle Gait velocity: decreased Stairs / Additional Locomotion Stairs: Yes Stairs Assistance: 2 Helpers Stair Management Technique: Two rails Number of Stairs: 2 Height of Stairs: 3 Curb: 2 Garment/textile technologist Mobility: No   Discharge Criteria: Patient will be discharged from PT if patient refuses treatment 3 consecutive times without medical reason, if treatment goals not met, if there is a  change in medical status, if patient makes no progress towards goals or if patient is discharged from hospital.  The above assessment, treatment plan, treatment alternatives and goals were discussed and mutually agreed upon: by  patient  Breck Coons, PT, DPT 09/03/2020, 4:12 PM

## 2020-09-03 NOTE — Progress Notes (Signed)
Sorbitol given, then enema in afternoon. Nothing but water passed following enema. Patient toileted after dinner no results yet. Passing gas.

## 2020-09-04 DIAGNOSIS — I5032 Chronic diastolic (congestive) heart failure: Secondary | ICD-10-CM | POA: Diagnosis not present

## 2020-09-04 DIAGNOSIS — I10 Essential (primary) hypertension: Secondary | ICD-10-CM | POA: Diagnosis not present

## 2020-09-04 DIAGNOSIS — Z20822 Contact with and (suspected) exposure to covid-19: Secondary | ICD-10-CM | POA: Diagnosis not present

## 2020-09-04 DIAGNOSIS — K592 Neurogenic bowel, not elsewhere classified: Secondary | ICD-10-CM | POA: Diagnosis not present

## 2020-09-04 DIAGNOSIS — S14129S Central cord syndrome at unspecified level of cervical spinal cord, sequela: Secondary | ICD-10-CM | POA: Diagnosis not present

## 2020-09-04 MED ORDER — SORBITOL 70 % SOLN
60.0000 mL | Status: AC
  Administered 2020-09-04: 60 mL via ORAL
  Filled 2020-09-04: qty 60

## 2020-09-04 MED ORDER — LINACLOTIDE 145 MCG PO CAPS
145.0000 ug | ORAL_CAPSULE | Freq: Every day | ORAL | Status: DC
  Administered 2020-09-04 – 2020-09-13 (×5): 145 ug via ORAL
  Filled 2020-09-04 (×16): qty 1

## 2020-09-04 NOTE — Progress Notes (Signed)
Attempted Dig stim on patient in AM, no stool present in rectum. Two doses sorbitol given per order and patient able to pass large bowel movement liquid and hard stool mixed.  Reporting 10/10 pain regardless of medication administration, alternating tylenol and Oxy IR. Educated patient on association with constipation and oxy, needs reinforcement.  Patient unable to void on her own despite toileting. Patient states cath procedure causes pain because of positioning.    Reported to night shift LPN

## 2020-09-04 NOTE — Progress Notes (Signed)
Inpatient Rehabilitation Care Coordinator Assessment and Plan Patient Details  Name: [name] MRN: [id number] Date of Birth: 02-Apr-1945  Today's Date: 09/04/2020  Hospital Problems: Principal Problem:   Central cord syndrome [facility])  Past Medical History:  Past Medical History:  Diagnosis Date   Alcoholism ([place])    recovering since 2000   Anxiety and depression    Arthritis BACK   Bipolar disorder ([place])    Blepharospasm LEFT EYE   CHF exacerbation ([place]) 02/24/2017   Chronic back pain    COPD (chronic obstructive pulmonary disease) (HCC)    Coronary artery disease CARDIOLOGIST- DR [place]--- LAST VISIT NOTE 09-07-2009  W/ CHART   Emphysema    GERD (gastroesophageal reflux disease)    History of alcohol abuse RECOVERING SINCE 2000   History of Left renal mass 03/02/2012   underwent partial nephrectomy   HTN (hypertension)    Idiopathic acute facial nerve palsy LEFT SIDE--  BOTOX THERAPY   Inferior MI ([place]) 2000--  POST PTCA W/ STENT X1   Osteoporosis    Other and unspecified general anesthetics causing adverse effect in therapeutic use post op delirium--  last anes record w/ chart  from   09-22-2009 (spinal w/ light sedation)   Peripheral vascular disease ([place]) POST RIGHT CAROTID SURG.  1995   Renal cell carcinoma ([place]) 07/09/12   Left mass   Rosacea LEFT FACIAL RASH   S/P radiation therapy 02/21/2012   38.75 Gy HDR 5 Fractions- vaginal cuff   Scoliosis    Seizures (HCC)    x 1 after abrupt discontinuation of  Clonidine   Status post carotid endarterectomy RIGHT --  1995   Status post primary angioplasty with coronary stent 2000--  POST INFERIOR MI   Unstable balance WALKS W/ CANE   Vaginal cancer [facility])    Past Surgical History:  Past Surgical History:  Procedure Laterality Date   BREAST EXCISIONAL BIOPSY Left 2019   b9 axilla Bx X 3   CAROTID ENDARTERECTOMY  1995   RIGHT   CATARACT EXTRACTION W/ INTRAOCULAR LENS  IMPLANT, BILATERAL     CERVICAL CONIZATION W/BX   09-23-2008   CORONARY ANGIOPLASTY WITH STENT PLACEMENT  2000-   INFERIOR MI   X1 STENT TO RCA   EUS N/A 03/05/2012   Procedure: FULL UPPER ENDOSCOPIC ULTRASOUND (EUS) RADIAL and EGD;  Surgeon: Milus Banister, MD;  [facility];  Service: Endoscopy;  Laterality: N/A;  ercp scope first than eus scope   HEMIARTHROPLASTY HIP  12-26-2008   LEFT FEMORAL NECK FX   ORIF HIP FRACTURE  02-13-2007   RIGHT FEMORAL NECK FX   ORIF RIGHT DISTAL RADIUS AND RIGHT PROXIMAL HUMEROUS NECK FX'S  10-10-2005   POSTERIOR CERVICAL LAMINECTOMY N/A 08/28/2020   Procedure: Laminectomy and Foraminotomy - Cervical Two-Three, Cervical Three-Four, with lateral mass fusion/ fixation;  Surgeon: Dawley, Theodoro Doing, DO;  Location: ;  Service: Neurosurgery;  Laterality: N/A;   RIGHT SHOULDER SURG.  2007   ROBOTIC ASSITED PARTIAL NEPHRECTOMY Left 07/09/2012   Procedure: ROBOTIC ASSITED PARTIAL NEPHRECTOMY;  Surgeon: Dutch Gray, MD;  [facility];  Service: Urology;  Laterality: Left;   TOTAL HIP ARTHROPLASTY  04-15-2008   POST FAILED  RIGHT HIP ORIF FEMORAL FX   TOTAL KNEE ARTHROPLASTY  09-22-2009   RIGHT   UPPER RIGHT VAGINAL REGION  12/28/11   BIOPSY: SQUAMOUS CELL CARCINOMA   VAGINAL HYSTERECTOMY  07/06/2009   Secondary to dysplasia   Social History:  reports that she quit  smoking about 10 years ago. Her smoking use included cigarettes. She has a 100.00 pack-year smoking history. She has never used smokeless tobacco. She reports that she does not currently use alcohol. She reports that she does not use drugs.  Family / Support Systems Marital Status: Divorced Spouse/Significant Other: Divorced Children: No children Other Supports: brother Michael to help PRN Anticipated Caregiver: [name] Ability/Limitations of Caregiver: None reported at this time. Friend is elderly as well. 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 Pt is adjusting as well as to be expected. Recent Psychosocial Issues: Pt admits to depression and taking medication. Psychiatric History: Pt reported PCP precribes psych medications, and she has diagnosed her with bipolar d/o. Pt does not think she has bipolar, and her friend [name] states she has never seen any behaviors. Substance Abuse History: Pt admits to being an alcoholic and quit in 2000. Pt admtis she quit smoking cigarettes 10 years ago.  Patient / Family Perceptions, Expectations & Goals Pt/Family understanding of illness & functional limitations: Pt and friend have a general understanding of care needs Premorbid pt/family roles/activities: Independent primarily. Pt states that her friend [name] drives her to all of her appointments, and goes grocery shopping. Anticipated changes in roles/activities/participation: Assistance with ADLs/IADLs Pt/family expectations/goals: Pt goal is to get out of here, be independent, and get stronger."  Community Resources Community Agencies: None Premorbid Home Care/DME Agencies: None Transportation available at discharge: Friend mary Resource referrals recommended: Neuropsychology  Discharge Planning Living Arrangements: Non-relatives/Friends Support Systems: Friends/neighbors, Other  relatives Type of Residence: Private residence Insurance Resources: Medicare Financial Resources: Social Security Financial Screen Referred: No Living Expenses: Mortgage Money Management: Patient, Other (Comment) (friend [name]) Does the patient have any problems obtaining your medications?: No Home Management: Pt friend mary manages all homecare needs Patient/Family Preliminary Plans: No changes Care Coordinator Barriers to Discharge: Decreased caregiver support, Lack of/limited family support Care Coordinator Anticipated Follow Up Needs: HH/OP  Clinical Impression SW met with pt and pt friend [name] in room to introduce self, explain role, and discuss discharge process. Pt is not a veteran. HCPOA is her friend [name]. Pt lives in a one level home with 2 steps to enter the home. DME: RW and home o2 ([employer]). Pt is concerned about being re-certified for oxygen with Dr. Ickerd since her appt is 8/31 and she will be in the hospital. SW informed will see if [employer] will allow physicians here to do it.   SW waiting on updates from Zack/[employer] to see if he is able to assist with her home o2.  *SW sent orders to [employer] via parachute requesting re-certification for home o2.    A  09/04/2020, 2:38 PM    

## 2020-09-04 NOTE — Progress Notes (Signed)
Speech Language Pathology Daily Session Note  Patient Details  Name: [name] MRN: [id number] Date of Birth: 1945-09-18  Today's Date: 09/04/2020 SLP Individual Time: 1430-1500 SLP Individual Time Calculation (min): 30 min  Short Term Goals: Week 1: SLP Short Term Goal 1 (Week 1): Patient will consume current diet (Regular solids, thin liquids) without significant oral difficulty and without overt s/s aspiration or penetration. SLP Short Term Goal 2 (Week 1): Patient will demonstrate recall of daily events and short term recall with modA cues. SLP Short Term Goal 3 (Week 1): Patient will demonstrate awareness to errors during functional tasks, with minA cues. SLP Short Term Goal 4 (Week 1): Patient will demonstrate adequate problem solving to complete functional and simulated ADL tasks with min-modA cues. SLP Short Term Goal 5 (Week 1): Patient will orient to time/date, with minA cues for using memory aids/compensatory strategies. SLP Short Term Goal 6 (Week 1): Patient will elaborate, fully respond to open-ended questions with modA cues to do so.  Skilled Therapeutic Interventions:   Patient seen for skilled ST session focusing on swallow function goals. RN and PT both informed SLP that patient was exhibiting significant difficulties eating solid foods today. Patient's friends [name] present in room and patient herself was alert and more animated and alert as compared to previous date.She reported that her neck hurts when she has to open her mouth and when chewing foods. SLP did observe patient to grimace even when opening mouth for ice cream. No overt s/s aspiration observed however. SLP explained puree textures solids and patient in agreement. Patient continues to benefit from skilled SLP intervention to maximize cognitive and swallow function prior to discharge.  Pain Pain Assessment Pain Scale: 0-10 Pain Score: 0-No pain Pain Type: Acute pain Pain Location: Neck Pain Radiating  Towards: back Pain Descriptors / Indicators: Aching Pain Frequency: Constant Pain Intervention(s): Medication (See eMAR)  Therapy/Group: Individual Therapy  Sonia Baller, MA, CCC-SLP Speech Therapy

## 2020-09-04 NOTE — IPOC Note (Signed)
Overall Plan of Care [facility] Patient Details Name: [name] MRN: [id number] DOB: 08-May-1945  Admitting Diagnosis: Central cord syndrome [facility])  Hospital Problems: Principal Problem:   Central cord syndrome [facility])     Functional Problem List: Nursing Bladder, Bowel, Medication Management, Pain, Endurance, Safety  PT Balance, Endurance, Motor, Pain, Safety  OT Balance, Cognition, Endurance, Motor, Pain, Safety, Vision  SLP Cognition, Safety, Nutrition  TR         Basic ADL's: OT Eating, Grooming, Bathing, Dressing, Toileting     Advanced  ADL's: OT       Transfers: PT Bed Mobility, Bed to Chair, Car, Manufacturing systems engineer, Metallurgist: PT Ambulation, Stairs     Additional Impairments: OT Fuctional Use of Upper Extremity  SLP Swallowing, Social Cognition   Social Interaction, Memory, Attention, Awareness, Problem Solving  TR      Anticipated Outcomes Item Anticipated Outcome  Self Feeding set up  Swallowing  mod I   Basic self-care  supervision/min a  Toileting  min A   Bathroom Transfers min A/CGA  Bowel/Bladder  manage bowel w mod I assist, bladder w mod I assist  Transfers  Supervision  Locomotion  Supervision  Communication  mod I  Cognition  mod I basic, supervision to minA complex  Pain  at or below level 4  Safety/Judgment  maintain safety w cues/reminders; C-collar on when OOB   Therapy Plan: PT Intensity: Minimum of 1-2 x/day ,45 to 90 minutes PT Frequency: 5 out of 7 days PT Duration Estimated Length of Stay: 3-3.5 weeks OT Intensity: Minimum of 1-2 x/day, 45 to 90 minutes OT Frequency: 5 out of 7 days OT Duration/Estimated Length of Stay: 3 weeks SLP Intensity: Minumum of 1-2 x/day, 30 to 90 minutes SLP Frequency: 3 to 5 out of 7 days SLP Duration/Estimated Length of Stay: 3 weeks   Due to the current state of emergency, patients may not be receiving their 3-hours of Medicare-mandated therapy.   Team  Interventions: Nursing Interventions Patient/Family Education, Bowel Management, Pain Management, Discharge Planning, Medication Management, Disease Management/Prevention, Bladder Management  PT interventions Ambulation/gait training, Community reintegration, DME/adaptive equipment instruction, Neuromuscular re-education, Psychosocial support, Stair training, UE/LE Strength taining/ROM, Wheelchair propulsion/positioning, Training and development officer, Discharge planning, Functional electrical stimulation, Pain management, Skin care/wound management, Therapeutic Activities, UE/LE Coordination activities, Cognitive remediation/compensation, Disease management/prevention, Functional mobility training, Patient/family education, Splinting/orthotics, Therapeutic Exercise, Visual/perceptual remediation/compensation  OT Interventions Balance/vestibular training, Neuromuscular re-education, Self Care/advanced ADL retraining, Cognitive remediation/compensation, DME/adaptive equipment instruction, Pain management, UE/LE Strength taining/ROM, Community reintegration, Barrister's clerk education, UE/LE Coordination activities, Discharge planning, Functional mobility training, Therapeutic Activities, Visual/perceptual remediation/compensation  SLP Interventions Cognitive remediation/compensation, Dysphagia/aspiration precaution training, Internal/external aids, Speech/Language facilitation, Medication managment, Environmental controls, Multimodal communication approach, Patient/family education, Functional tasks  TR Interventions    SW/CM Interventions Discharge Planning, Psychosocial Support, Patient/Family Education   Barriers to Discharge MD  Medical stability  Nursing Decreased caregiver support, Home environment access/layout, Neurogenic Bowel & Bladder, Behavior 1 level 2ste w roommate, has rollator, had S w bathing and roomate cooked PTA  PT Home environment access/layout    OT      SLP      SW Decreased  caregiver support, Lack of/limited family support     Team Discharge Planning: Destination: PT-Home ,OT- Home , SLP-Home Projected Follow-up: PT-Home health PT, 24 hour supervision/assistance, OT-  Home health OT, SLP-Other (comment) (TBD) Projected Equipment Needs: PT-To be determined, OT- 3 in 1 bedside comode, Tub/shower bench, SLP-  Equipment Details: PT- , OT-  Patient/family involved in discharge planning: PT- Patient,  OT-Patient, Family member/caregiver, SLP-Patient, Family member/caregiver  MD ELOS: 3 weeks Medical Rehab Prognosis:  Excellent Assessment: The patient has been admitted for CIR therapies with the diagnosis of central cord syndrome. The team will be addressing functional mobility, strength, stamina, balance, safety, adaptive techniques and equipment, self-care, bowel and bladder mgt, patient and caregiver education, NMR, spinal precautions, pain control. Goals have been set at supervision/min assist with self-care, supervision with mobility and mod I with cognition and communication.   Due to the current state of emergency, patients may not be receiving their 3 hours per day of Medicare-mandated therapy.    Meredith Staggers, MD, FAAPMR     See Team Conference Notes for weekly updates to the plan of care

## 2020-09-04 NOTE — Care Management (Signed)
Inpatient Rehabilitation Center Individual Statement of Services  Patient Name:  [name]  Date:  09/04/2020  Welcome to the [facility].  Our goal is to provide you with an individualized program based on your diagnosis and situation, designed to meet your specific needs.  With this comprehensive rehabilitation program, you will be expected to participate in at least 3 hours of rehabilitation therapies Monday-Friday, with modified therapy programming on the weekends.  Your rehabilitation program will include the following services:  Physical Therapy (PT), Occupational Therapy (OT), Speech Therapy (ST), 24 hour per day rehabilitation nursing, Therapeutic Recreaction (TR), Psychology, Neuropsychology, Care Coordinator, Rehabilitation Medicine, [place], and Other  Weekly team conferences will be held on Tuesdays to discuss your progress.  Your Inpatient Rehabilitation Care Coordinator will talk with you frequently to get your input and to update you on team discussions.  Team conferences with you and your family in attendance may also be held.  Expected length of stay: 3 weeks     Overall anticipated outcome: Supervision  Depending on your progress and recovery, your program may change. Your Inpatient Rehabilitation Care Coordinator will coordinate services and will keep you informed of any changes. Your Inpatient Rehabilitation Care Coordinator's name and contact numbers are listed  below.  The following services may also be recommended but are not provided by the [place] will be made to provide these services after discharge if needed.  Arrangements include referral to agencies that provide these services.  Your insurance has been verified to be:  Medicare A/B  Your primary  doctor is:  Harlan Stains  Pertinent information will be shared with your doctor and your insurance company.  Inpatient Rehabilitation Care Coordinator:  Cathleen Corti [phone] or [phone]  Information discussed with and copy given to patient by: Rana Snare, 09/04/2020, 10:06 AM

## 2020-09-04 NOTE — Progress Notes (Signed)
Orthopedic Tech Progress Note Patient Details:  [name] 10-01-1945 [id number] Called [facility] yesterday (8/18) to inform them of situation. Patient ID: [name], female   DOB: Apr 03, 1945, 75 y.o.   MRN: [id number]  Chip Boer 09/04/2020, 8:31 AM

## 2020-09-04 NOTE — Progress Notes (Addendum)
PROGRESS NOTE   Subjective/Complaints: Having generalized neck pain still. Did better with tizanidine. Received 2 doses yesterday. Received new collar.  ROS: Patient denies fever, rash, sore throat, blurred vision, nausea, vomiting, diarrhea, cough, shortness of breath or chest pain, joint or back pain, headache, or mood change.   Objective:   VAS [place] LOWER EXTREMITY VENOUS (DVT)  Result Date: 09/03/2020  Lower Venous DVT Study Patient Name:  [name] [facility]  Date of Exam:   09/03/2020 Medical Rec #: [id number]         Accession #:    [id number] Date of Birth: Oct 25, 1945         Patient Gender: F Patient Age:   75 years Exam Location:  [facility] Procedure:      VAS [place] LOWER EXTREMITY VENOUS (DVT) Referring Phys: Lauraine Rinne --------------------------------------------------------------------------------  Indications: Swelling.  Comparison Study: No prior study Performing Technologist: Maudry Mayhew MHA, RDMS, RVT, RDCS  Examination Guidelines: A complete evaluation includes B-mode imaging, spectral Doppler, color Doppler, and power Doppler as needed of all accessible portions of each vessel. Bilateral testing is considered an integral part of a complete examination. Limited examinations for reoccurring indications may be performed as noted. The reflux portion of the exam is performed with the patient in reverse Trendelenburg.  +---------+---------------+---------+-----------+----------+--------------+ RIGHT    CompressibilityPhasicitySpontaneityPropertiesThrombus Aging +---------+---------------+---------+-----------+----------+--------------+ CFV      Full           Yes      Yes                                 +---------+---------------+---------+-----------+----------+--------------+ SFJ      Full                                                         +---------+---------------+---------+-----------+----------+--------------+ FV Prox  Full                                                        +---------+---------------+---------+-----------+----------+--------------+ FV Mid   Full                                                        +---------+---------------+---------+-----------+----------+--------------+ FV DistalFull                                                        +---------+---------------+---------+-----------+----------+--------------+ PFV      Full                                                        +---------+---------------+---------+-----------+----------+--------------+  POP      Full           Yes      Yes                                 +---------+---------------+---------+-----------+----------+--------------+ PTV      Full                                                        +---------+---------------+---------+-----------+----------+--------------+ PERO     Full                                                        +---------+---------------+---------+-----------+----------+--------------+   +---------+---------------+---------+-----------+----------+--------------+ LEFT     CompressibilityPhasicitySpontaneityPropertiesThrombus Aging +---------+---------------+---------+-----------+----------+--------------+ CFV      Full           Yes      Yes                                 +---------+---------------+---------+-----------+----------+--------------+ SFJ      Full                                                        +---------+---------------+---------+-----------+----------+--------------+ FV Prox  Full                                                        +---------+---------------+---------+-----------+----------+--------------+ FV Mid   Full                                                         +---------+---------------+---------+-----------+----------+--------------+ FV DistalFull                                                        +---------+---------------+---------+-----------+----------+--------------+ PFV      Full                                                        +---------+---------------+---------+-----------+----------+--------------+ POP      Full           Yes      Yes                                 +---------+---------------+---------+-----------+----------+--------------+  PTV      Full                                                        +---------+---------------+---------+-----------+----------+--------------+ PERO     Full                                                        +---------+---------------+---------+-----------+----------+--------------+     Summary: RIGHT: - There is no evidence of deep vein thrombosis in the lower extremity.  - No cystic structure found in the popliteal fossa.  LEFT: - There is no evidence of deep vein thrombosis in the lower extremity.  - No cystic structure found in the popliteal fossa.  *See table(s) above for measurements and observations. Electronically signed by Servando Snare MD on 09/03/2020 at 4:46:26 PM.    Final    Recent Labs    09/02/20 1448 09/03/20 0500  WBC 11.5* 8.4  HGB 14.0 13.1  HCT 37.5 38.7  PLT 283 270   Recent Labs    09/02/20 1448 09/03/20 0500  NA  --  134*  K  --  3.7  CL  --  97*  CO2  --  27  GLUCOSE  --  114*  BUN  --  7*  CREATININE 0.71 0.68  CALCIUM  --  8.9    Intake/Output Summary (Last 24 hours) at 09/04/2020 1048 Last data filed at 09/04/2020 0830 Gross per 24 hour  Intake 517 ml  Output 1825 ml  Net -1308 ml        Physical Exam: Vital Signs Blood pressure (!) 142/65, pulse 83, temperature 98.8 F (37.1 C), temperature source Oral, resp. rate 18, height '5\' 3"'$  (1.6 m), weight 81 kg, SpO2 93 %.  Constitutional: No distress . Vital signs  reviewed. HEENT: NCAT, EOMI, oral membranes moist Neck: now in [place] J, fits better. Head forward posture Cardiovascular: RRR without murmur. No JVD    Respiratory/Chest: CTA Bilaterally without wheezes or rales. Normal effort    GI/Abdomen: BS +, non-tender, non-distended Ext: no clubbing, cyanosis, or edema Psych: pleasant and cooperative. Seems to be in good spirits Skin: cervical wound cdi with honeycomb dressing. A few scattered lacs and abrasions on legs/arms, bruises remain.  Neuro:  fairly alert, oriented. Follows commands. Able to carry conversation. No CN findings. RUE 4+/5. LUE 4, decreased sensation to LT/pain LUE>LUE. LE4- prox to 4/5 distally. No sensory findings in legs. DTR's 3+ Musculoskeletal: cervicalgia ongoing in collar.     Assessment/Plan: 1. Functional deficits which require 3+ hours per day of interdisciplinary therapy in a comprehensive inpatient rehab setting. Physiatrist is providing close team supervision and 24 hour management of active medical problems listed below. Physiatrist and rehab team continue to assess barriers to discharge/monitor patient progress toward functional and medical goals  Care Tool:  Bathing        Body parts bathed by helper: Right arm, Left arm, Chest, Abdomen, Front perineal area, Buttocks, Right upper leg, Left upper leg, Right lower leg, Left lower leg, Face     Bathing assist Assist Level: Dependent - Patient 0%     Upper Body Dressing/Undressing Upper  body dressing   What is the patient wearing?: Hospital gown only    Upper body assist Assist Level: Total Assistance - Patient < 25%    Lower Body Dressing/Undressing Lower body dressing      What is the patient wearing?: Hospital gown only     Lower body assist Assist for lower body dressing: Dependent - Patient 0%     Toileting Toileting    Toileting assist Assist for toileting: 2 Helpers     Transfers Chair/bed transfer  Transfers assist      Chair/bed transfer assist level: Moderate Assistance - Patient 50 - 74%     Locomotion Ambulation   Ambulation assist      Assist level: 2 helpers Assistive device: Walker-rolling Max distance: 15'   Walk 10 feet activity   Assist     Assist level: 2 helpers Assistive device: Walker-rolling   Walk 50 feet activity   Assist Walk 50 feet with 2 turns activity did not occur: Safety/medical concerns         Walk 150 feet activity   Assist Walk 150 feet activity did not occur: Safety/medical concerns         Walk 10 feet on uneven surface  activity   Assist Walk 10 feet on uneven surfaces activity did not occur: Safety/medical concerns         Wheelchair     Assist Will patient use wheelchair at discharge?: No             Wheelchair 50 feet with 2 turns activity    Assist            Wheelchair 150 feet activity     Assist          Blood pressure (!) 142/65, pulse 83, temperature 98.8 F (37.1 C), temperature source Oral, resp. rate 18, height '5\' 3"'$  (1.6 m), weight 81 kg, SpO2 93 %.     Medical Problem List and Plan: 1.  Gait abnormality secondary to severe spondylitic myelopathy with severe stenosis C2-3 3-4/central cord syndrome.  Status post cervical arthrodesis with posterior cervical bilateral laminectomy 08/28/2020.  Cervical collar as directed             -patient may shower             -ELOS/Goals: Supervision/Min A 17-21 days.             -Continue CIR therapies including PT, OT, and SLP   -fitted with smaller [place] J collar which fits much better.   2.  Antithrombotics: -DVT/anticoagulation: Subcutaneous heparin.  Check vascular study Mechanical: Sequential compression devices, below knee Bilateral lower extremities Pharmaceutical: sq hep             -antiplatelet therapy: N/A 3. Pain Management: Oxycodone as needed              Monitor with increased exertion  -dc'ed robaxin d/t sedation.   -continue  tizanidine trial 4. Mood: Zoloft 50 mg daily, Lamictal 200 mg twice daily             -antipsychotic agents: Abilify 10 mg daily  -mood fair at present. Continue to monitor 5. Neuropsych: This patient is?  Fully capable of making decisions on her own behalf. 6. Skin/Wound Care: Routine skin checks 7. Fluids/Electrolytes/Nutrition: Routine in and outs             CMP reviewed. Add protein supp for low albumin 8.  Hypertension.  Hydralazine 25 mg twice daily, Avapro  300 mg daily, Norvasc 10 mg daily             borderline. Tizanidine helping too 9.  COPD/history of tobacco use.  Continue inhalers as directed.  Monitor oxygen saturations  -O2 [place] as needed 10.  GERD.  Protonix 11.  CAD with history of stenting.  Plan to discuss with neurosurgery when to resume low-dose aspirin 12.  Diastolic congestive heart failure.  Lasix 20 mg daily.  Monitor for any signs of fluid overload   Filed Weights   09/02/20 1500 09/03/20 0704 09/04/20 0419  Weight: 81.2 kg 87 kg 81 kg    -weight back down today to baseline   -monitor daily 13. Neurogenic bowel: still no bm yesterday despite sorbitol and sse    8/19 mg citrate on backorder -repeat sorbitol and SSE later today if needed -added linzess 14. Neurogenic bladder:  -continue voiding trial  -I/O cath prn to keep volumes between 300-500cc    LOS: 2 days A FACE TO [place] 09/04/2020, 10:48 AM

## 2020-09-04 NOTE — Progress Notes (Signed)
Physical Therapy Session Note  Patient Details  Name: [name] MRN: [id number] Date of Birth: November 14, 1945  Today's Date: 09/04/2020 PT Individual Time: 0805-0920 PT Individual Time Calculation (min): 75 min   Short Term Goals: Week 1:  PT Short Term Goal 1 (Week 1): Pt will perform bed mobility consistently with modA. PT Short Term Goal 2 (Week 1): Pt will peform bed to chair transfer with minA. PT Short Term Goal 3 (Week 1): Pt will ambulate x50' with modA +1 and LRAD.  Skilled Therapeutic Interventions/Progress Updates: Pt presented in bed with nsg present completing morning meds and agreeable to therapy. Pt states pain 10/10 received pain meds and unable to take any additional until closer to noon. PTA obtained scrub pants prior to pt performing bed mobility with pt attempting to eat muffin upon PTA's arrival with NT. Pt and NT state was unable to eat muffin as was difficult to swallow. PTA offered applesauce and ice cream to which pt agreeable. Pt required hand over hand assist for holding spoon and bringing to mouth. PTA obtained red tubing for easier grip with pt stating unable to hold but was able to demonstrate better grasp and required minA for guiding food to mouth. PTA did observe that pt was able to reach for drink and bring to mouth with supervision. Once completed pt performed supine to sit with minA for truncal support with posterior lean demonstrated but required maxA for scooting to EOB. PTA donned pants total A and pt performed STS with minA and PTA pulled pants over hips total A. Pt was able to perform stand pivot transfer with RW and modA with verbal cues for hand placement. Pt then transported to rehab gym for energy conservation and time management. Performed stand pivot transfer with RW and minA however demonstrating decreased eccentric control upon sitting. Pt continues to demonstrate posterior lean which she was able to correct with verbal cues however unable to maintain  for more than ~20 seconds. Pt participated in reaching activity for sitting balance placing cards on board. With card placed in front of her pt was able to grab card, provide correct name and initiate reaching to board. PTA providing modA to extend arm and place on board. Pt then participated in ambulation with w/c follow 75f and 142fwith minA for STS and minA for ambulation. Pt required multimodal cues for improving posture, looking forward, and increasing BOS. No LOB noted but during turn pt required minA for RW management. Pt transported back to room after ambulation and pt performed stand pivot transfer to bed with RW and minA overall. Pt required maxA for sit to supine for both truncal support and LE management. Pt attempted to scoot while in long sit higher up in bed for improved positioning however unable to complete therefore PTA and pt's brother assisted using pad to [facility] in bed at end of session with bed alarm on, call bell within reach and brother present.      Therapy Documentation Precautions:  Precautions Precautions: Cervical, Fall Precaution Comments: watch O2 sats Required Braces or Orthoses: Cervical Brace Cervical Brace: Hard collar, At all times Restrictions Weight Bearing Restrictions: No General:   Vital Signs: Therapy Vitals Temp: 98 F (36.7 C) Temp Source: Oral Pulse Rate: 86 Resp: 18 BP: 139/70 Patient Position (if appropriate): Lying Oxygen Therapy SpO2: 98 % O2 Device: Nasal Cannula O2 Flow Rate (L/min): 3 L/min Pain: Pain Assessment Pain Scale: 0-10 Pain Score: 10-Worst pain ever  Pain Type: Acute pain Pain Location: Neck Pain Radiating Towards: back Pain Descriptors / Indicators: Aching Pain Frequency: Constant Pain Onset: On-going Pain Intervention(s): Medication (See eMAR) Mobility:   Locomotion :    Trunk/Postural Assessment :    Balance:   Exercises:   Other Treatments:      Therapy/Group: Individual  Therapy  Amyrie Illingworth 09/04/2020, 4:06 PM

## 2020-09-04 NOTE — Progress Notes (Signed)
Occupational Therapy Session Note  Patient Details  Name: [name] MRN: [id number] Date of Birth: 07-18-1945  Today's Date: 09/04/2020 OT Individual Time: [id number] OT Individual Time Calculation (min): 70 min    Short Term Goals: Week 1:  OT Short Term Goal 1 (Week 1): patient will complete bed mobility and SPT with mod A OT Short Term Goal 2 (Week 1): patient will complete eating, grooming and upper body adl with mod A using AD/built up handles OT Short Term Goal 3 (Week 1): patient will tolerate unsupported sitting with min A OT Short Term Goal 4 (Week 1): patient will complete toileting max A of one  Skilled Therapeutic Interventions/Progress Updates:    Pt resting in bed upon arrival. Initial intervention with focus on bed mobility to roll in bed to facilitate RN perform digital stimulation. Max A for rolling R/L using bed rails to maintain sidelying position. Pt agreed to don clothing her friend had brought from home. Supine>sit EOB with min A and max A to scoot towards edge of bed. Tot A for threading BLE into pants. Sit<>stand from EOB with min A. Dependent for pulling pants over hips. Sitting EOB pt donned pullover shirt with mod A for placing over head and pulling over trunk. Sit<>stand from EOB and side step towards HOB with min A using RW. +2 for repositioning in bed. Discussed bathing arrangements at home. Pt has tub. Educated pt and friend on use of TTB and information provided. Will demonstrate during later sessions. Pt remained in bed with all needs within reach and CSW present.   Therapy Documentation Precautions:  Precautions Precautions: Cervical, Fall Precaution Comments: watch O2 sats Required Braces or Orthoses: Cervical Brace Cervical Brace: Hard collar, At all times Restrictions Weight Bearing Restrictions: No Pain: Pain Assessment Pain Scale: 0-10 Pain Score: 10-Worst pain ever Pain Type: Acute pain Pain Location: Neck Pain Orientation:  Right;Left Pain Descriptors / Indicators: Aching Pain Frequency: Constant Pain Onset: On-going Pain Intervention(s): RN aware, repositioned, emotional support   Therapy/Group: Individual Therapy  Leroy Libman 09/04/2020, 12:04 PM

## 2020-09-05 NOTE — Progress Notes (Signed)
Pt reports poorly controlled pain, PRN medications given frequently with little relief.

## 2020-09-05 NOTE — Progress Notes (Signed)
PROGRESS NOTE   Subjective/Complaints:  No issues overnight, feels like she could lift up her left arm better. Good appetite, bowels are little slow  ROS: Patient denies CP, SOB, N/V/D Objective:   VAS [place] LOWER EXTREMITY VENOUS (DVT)  Result Date: 09/03/2020  Lower Venous DVT Study Patient Name:  YUKA SIRAVO [facility]  Date of Exam:   09/03/2020 Medical Rec #: [id number]         Accession #:    [id number] Date of Birth: 11-29-1945         Patient Gender: F Patient Age:   75 years Exam Location:  [facility] Procedure:      VAS [place] LOWER EXTREMITY VENOUS (DVT) Referring Phys: Lauraine Rinne --------------------------------------------------------------------------------  Indications: Swelling.  Comparison Study: No prior study Performing Technologist: Maudry Mayhew MHA, RDMS, RVT, RDCS  Examination Guidelines: A complete evaluation includes B-mode imaging, spectral Doppler, color Doppler, and power Doppler as needed of all accessible portions of each vessel. Bilateral testing is considered an integral part of a complete examination. Limited examinations for reoccurring indications may be performed as noted. The reflux portion of the exam is performed with the patient in reverse Trendelenburg.  +---------+---------------+---------+-----------+----------+--------------+ RIGHT    CompressibilityPhasicitySpontaneityPropertiesThrombus Aging +---------+---------------+---------+-----------+----------+--------------+ CFV      Full           Yes      Yes                                 +---------+---------------+---------+-----------+----------+--------------+ SFJ      Full                                                        +---------+---------------+---------+-----------+----------+--------------+ FV Prox  Full                                                         +---------+---------------+---------+-----------+----------+--------------+ FV Mid   Full                                                        +---------+---------------+---------+-----------+----------+--------------+ FV DistalFull                                                        +---------+---------------+---------+-----------+----------+--------------+ PFV      Full                                                        +---------+---------------+---------+-----------+----------+--------------+  POP      Full           Yes      Yes                                 +---------+---------------+---------+-----------+----------+--------------+ PTV      Full                                                        +---------+---------------+---------+-----------+----------+--------------+ PERO     Full                                                        +---------+---------------+---------+-----------+----------+--------------+   +---------+---------------+---------+-----------+----------+--------------+ LEFT     CompressibilityPhasicitySpontaneityPropertiesThrombus Aging +---------+---------------+---------+-----------+----------+--------------+ CFV      Full           Yes      Yes                                 +---------+---------------+---------+-----------+----------+--------------+ SFJ      Full                                                        +---------+---------------+---------+-----------+----------+--------------+ FV Prox  Full                                                        +---------+---------------+---------+-----------+----------+--------------+ FV Mid   Full                                                        +---------+---------------+---------+-----------+----------+--------------+ FV DistalFull                                                         +---------+---------------+---------+-----------+----------+--------------+ PFV      Full                                                        +---------+---------------+---------+-----------+----------+--------------+ POP      Full           Yes      Yes                                 +---------+---------------+---------+-----------+----------+--------------+  PTV      Full                                                        +---------+---------------+---------+-----------+----------+--------------+ PERO     Full                                                        +---------+---------------+---------+-----------+----------+--------------+     Summary: RIGHT: - There is no evidence of deep vein thrombosis in the lower extremity.  - No cystic structure found in the popliteal fossa.  LEFT: - There is no evidence of deep vein thrombosis in the lower extremity.  - No cystic structure found in the popliteal fossa.  *See table(s) above for measurements and observations. Electronically signed by Servando Snare MD on 09/03/2020 at 4:46:26 PM.    Final    Recent Labs    09/02/20 1448 09/03/20 0500  WBC 11.5* 8.4  HGB 14.0 13.1  HCT 37.5 38.7  PLT 283 270    Recent Labs    09/02/20 1448 09/03/20 0500  NA  --  134*  K  --  3.7  CL  --  97*  CO2  --  27  GLUCOSE  --  114*  BUN  --  7*  CREATININE 0.71 0.68  CALCIUM  --  8.9     Intake/Output Summary (Last 24 hours) at 09/05/2020 1217 Last data filed at 09/05/2020 1050 Gross per 24 hour  Intake 360 ml  Output 1374 ml  Net -1014 ml         Physical Exam: Vital Signs Blood pressure (!) 120/58, pulse (!) 53, temperature 97.6 F (36.4 C), temperature source Oral, resp. rate 18, height '5\' 3"'$  (1.6 m), weight 79.6 kg, SpO2 94 %.   General: No acute distress Mood and affect are appropriate Heart: Regular rate and rhythm no rubs murmurs or extra sounds Lungs: Clear to auscultation, breathing unlabored, no rales  or wheezes Abdomen: Positive bowel sounds, soft nontender to palpation, nondistended Extremities: No clubbing, cyanosis, or edema Skin: No evidence of breakdown, no evidence of rash   Skin: cervical wound cdi with honeycomb dressing. A few scattered lacs and abrasions on legs/arms, bruises remain.  Neuro:  fairly alert, oriented. Follows commands. Able to carry conversation. No CN findings. RUE 4+/5. LUE 4, decreased sensation to LT/pain LUE>LUE. LE4- prox to 4/5 distally. No sensory findings in legs. DTR's 3+ Musculoskeletal: cervicalgia ongoing in collar.     Assessment/Plan: 1. Functional deficits which require 3+ hours per day of interdisciplinary therapy in a comprehensive inpatient rehab setting. Physiatrist is providing close team supervision and 24 hour management of active medical problems listed below. Physiatrist and rehab team continue to assess barriers to discharge/monitor patient progress toward functional and medical goals  Care Tool:  Bathing        Body parts bathed by helper: Right arm, Left arm, Chest, Abdomen, Front perineal area, Buttocks, Right upper leg, Left upper leg, Right lower leg, Left lower leg, Face     Bathing assist Assist Level: Dependent - Patient 0%     Upper Body Dressing/Undressing Upper body dressing  What is the patient wearing?: Pull over shirt, Orthosis    Upper body assist Assist Level: Maximal Assistance - Patient 25 - 49%    Lower Body Dressing/Undressing Lower body dressing      What is the patient wearing?: Incontinence brief, Pants     Lower body assist Assist for lower body dressing: Total Assistance - Patient < 25%     Toileting Toileting    Toileting assist Assist for toileting: 2 Helpers     Transfers Chair/bed transfer  Transfers assist     Chair/bed transfer assist level: Moderate Assistance - Patient 50 - 74%     Locomotion Ambulation   Ambulation assist      Assist level: 2 helpers Assistive  device: Walker-rolling Max distance: 15'   Walk 10 feet activity   Assist     Assist level: 2 helpers Assistive device: Walker-rolling   Walk 50 feet activity   Assist Walk 50 feet with 2 turns activity did not occur: Safety/medical concerns         Walk 150 feet activity   Assist Walk 150 feet activity did not occur: Safety/medical concerns         Walk 10 feet on uneven surface  activity   Assist Walk 10 feet on uneven surfaces activity did not occur: Safety/medical concerns         Wheelchair     Assist Will patient use wheelchair at discharge?: No             Wheelchair 50 feet with 2 turns activity    Assist            Wheelchair 150 feet activity     Assist          Blood pressure (!) 120/58, pulse (!) 53, temperature 97.6 F (36.4 C), temperature source Oral, resp. rate 18, height '5\' 3"'$  (1.6 m), weight 79.6 kg, SpO2 94 %.     Medical Problem List and Plan: 1.  Gait abnormality secondary to severe spondylitic myelopathy with severe stenosis C2-3 3-4/central cord syndrome.  Status post cervical arthrodesis with posterior cervical bilateral laminectomy 08/28/2020.  Cervical collar as directed             -patient may shower             -ELOS/Goals: Supervision/Min A 17-21 days.             -Continue CIR therapies including PT, OT, and SLP   -fitted with smaller [place] J collar which fits much better.   2.  Antithrombotics: -DVT/anticoagulation: Subcutaneous heparin.  Check vascular study Mechanical: Sequential compression devices, below knee Bilateral lower extremities Pharmaceutical: sq hep             -antiplatelet therapy: N/A 3. Pain Management: Oxycodone as needed              Monitor with increased exertion  -dc'ed robaxin d/t sedation.   -continue tizanidine trial 4. Mood: Zoloft 50 mg daily, Lamictal 200 mg twice daily             -antipsychotic agents: Abilify 10 mg daily  -mood fair at present. Continue to  monitor 5. Neuropsych: This patient is?  Fully capable of making decisions on her own behalf. 6. Skin/Wound Care: Routine skin checks 7. Fluids/Electrolytes/Nutrition: Routine in and outs             CMP reviewed. Add protein supp for low albumin 8.  Hypertension.  Hydralazine 25 mg twice daily,  Avapro 300 mg daily, Norvasc 10 mg daily             borderline. Tizanidine helping too Vitals:   09/04/20 1916 09/05/20 0307  BP: (!) 167/77 (!) 120/58  Pulse: 80 (!) 53  Resp: 16 18  Temp: 98.4 F (36.9 C) 97.6 F (36.4 C)  SpO2: 92% 94%   Lability check orthostatic vitals 9.  COPD/history of tobacco use.  Continue inhalers as directed.  Monitor oxygen saturations  -O2 [place]-[address] as needed 10.  GERD.  Protonix 11.  CAD with history of stenting.  Plan to discuss with neurosurgery when to resume low-dose aspirin 12.  Diastolic congestive heart failure.  Lasix 20 mg daily.  Monitor for any signs of fluid overload   Filed Weights   09/03/20 0704 09/04/20 0419 09/05/20 0307  Weight: 87 kg 81 kg 79.6 kg    -weight back down today to baseline   -monitor daily 13. Neurogenic bowel: still no bm yesterday despite sorbitol and sse    8/19 mg citrate on backorder -repeat sorbitol and SSE later today if needed -added linzess 14. Neurogenic bladder:  -continue voiding trial  -I/O cath prn to keep volumes between 300-500cc    LOS: 3 days A FACE TO [place] E Ethanael Veith 09/05/2020, 12:17 PM

## 2020-09-06 MED ORDER — HYDRALAZINE HCL 25 MG PO TABS
25.0000 mg | ORAL_TABLET | Freq: Three times a day (TID) | ORAL | Status: DC
  Administered 2020-09-06 – 2020-09-16 (×31): 25 mg via ORAL
  Filled 2020-09-06 (×33): qty 1

## 2020-09-06 NOTE — Progress Notes (Addendum)
Occupational Therapy Session Note  Patient Details  Name: [name] MRN: [id number] Date of Birth: Jan 02, 1946  Today's Date: 09/06/2020 OT Individual Time: [id number] OT Individual Time Calculation (min): 55 min    Short Term Goals: Week 1:  OT Short Term Goal 1 (Week 1): patient will complete bed mobility and SPT with mod A OT Short Term Goal 2 (Week 1): patient will complete eating, grooming and upper body adl with mod A using AD/built up handles OT Short Term Goal 3 (Week 1): patient will tolerate unsupported sitting with min A OT Short Term Goal 4 (Week 1): patient will complete toileting max A of one  Skilled Therapeutic Interventions/Progress Updates:    Pt resting in bed upon arrival and agreeable to therapy. Pt stated her brother would be visiting later but still agreeable to changing clothing. Pt in good spirits and excited that she has improved AROM of Lt shoulder/UE-~110* shoulder flexion. RUE limited from premorbid surgery. B grasp WNL. Pt continues with neck and back pain. Supine>sit EOB with HOB elevated at [place]. Sitting balance with supervision. Significant rounded shoulders and forward head. Pt with increased pain with unsupported sitting EOB. Sit<>stand from EOB with min A and min verbal cues for sequencing/hand placement. Standing balance with CGA. Sidestepping with RW to [facility] with CGA. Sit>supine with HOB elevated with CGA. Pt required assistance for repositioning towards HOB. Pt unable to full extend when supine 2/2 increased neck pain. Pt able to assist with repositioning by pushing with BLE. UB dressing with mod/max A. Pt able to thread BUE into sleeves past elbows but requires assistance placing over head and pulling over trunk. Pt max A for LB dressing without AE. O2 97% on 3L O2. Dereased O2 to 2L (pt reports she keeps home O2 on 2L). LPN Gwenlyn Perking notified of change.Pt remained in bed with all needs within reach and bed alarm activated.   Therapy  Documentation Precautions:  Precautions Precautions: Cervical, Fall Precaution Comments: watch O2 sats Required Braces or Orthoses: Cervical Brace Cervical Brace: Hard collar, At all times Restrictions Weight Bearing Restrictions: No Pain: Pain Assessment Pain Scale: 0-10 Pain Score: 9  Faces Pain Scale: No hurt Pain Type: Acute pain Pain Location: Neck Pain Orientation: Mid Pain Radiating Towards: back Pain Descriptors / Indicators: Aching Pain Frequency: Constant Pain Onset: On-going Patients Stated Pain Goal: 1 Pain Intervention(s): premedicated; repositioned   Therapy/Group: Individual Therapy  Leroy Libman 09/06/2020, 9:05 AM

## 2020-09-06 NOTE — Progress Notes (Signed)
Pt  continues to complain of  uncontrolled pain. PRN medications  given on consistent basis with no relief. MD made aware.

## 2020-09-06 NOTE — Progress Notes (Signed)
Physical Therapy Session Note  Patient Details  Name: [name] MRN: [id number] Date of Birth: [birth year]  Today's Date: 09/06/2020 PT Individual Time: 1005-1100 PT Individual Time Calculation (min): 55 min   Short Term Goals: Week 1:  PT Short Term Goal 1 (Week 1): Pt will perform bed mobility consistently with modA. PT Short Term Goal 2 (Week 1): Pt will peform bed to chair transfer with minA. PT Short Term Goal 3 (Week 1): Pt will ambulate x50' with modA +1 and LRAD.  Skilled Therapeutic Interventions/Progress Updates:     Patient in bed with [facility] c-collar donned upon PT arrival. Patient alert and agreeable to PT session. Patient reported 9/10 neck and back pain during session, RN provided pain medicine at beginning of session. PT provided repositioning, rest breaks, and distraction as pain interventions throughout session.   Patient reported that she may need to have a BM, but was unsure at the beginning of session. Patient agreeable to trying toileting in the bathroom with BSC over the toilet.   Patient with [facility] collar in place throughout session, adjusted with total A with patient in supine prior to mobility for improved positioning. Patient on 2 L/min throughout session, SPO2 >92% throughout.  Therapeutic Activity: Bed Mobility: Patient performed supine to/from sit with supervision for cuing for log roll technique with HOB slightly elevated and use of bed rail.  Transfers: Patient performed stand pivot bed>w/c and w/c<>BSC over toilet and sit to/from stand from w/c with CGA using RW. Provided verbal cues for hand placement, keeping close proximity to RW during turns for improved balance, and reaching back for controlled descent. Patient continent of bowl and bladder during toileting, see flowsheet for details. Provided cues for diaphragmatic breathing and relaxation techniques to initiate bowl/bladder movement. Performed peri-care and lower body clothing management  with total A. Performed hand hygiene seated in w/c at the sink with set-up assist using a washcloth.   Gait Training:  Patient ambulated 25 feet using RW with CGA, limited by fatigue. Ambulated with crouched gait, increasing with fatigue, shuffling gait, and  significant thoracic and cervical kyphosis. Provided verbal cues for gluteal and quad activation, leading with her heel during swing to promote step height and length, and looking ahead as tolerated due to cervical and back pain.  Patient in bed with her brother and best friend at bedside at end of session with breaks locked, bed alarm set, and all needs within reach.   Therapy Documentation Precautions:  Precautions Precautions: Cervical, Fall Precaution Comments: watch O2 sats Required Braces or Orthoses: Cervical Brace Cervical Brace: Hard collar, At all times Restrictions Weight Bearing Restrictions: No     Therapy/Group: Individual Therapy  Maudine Kluesner L Proctor Carriker PT, DPT  09/06/2020, 5:01 PM

## 2020-09-06 NOTE — Progress Notes (Signed)
Physical Therapy Note  Patient Details  Name: [name] MRN: [id number] Date of Birth: July 14, 1945 Today's Date: 09/06/2020    Patient in bed asleep upon PT arrival with C-collar in place. Patient easily aroused and reporting increased pain and fatigue this afternoon and requesting to rest. Patient missed 30 min of skilled PT due to pain/fatigue, RN made aware. Will attempt to make-up missed time as able.     Shelanda Duvall L Margene Cherian PT, DPT  09/06/2020, 5:02 PM

## 2020-09-06 NOTE — Progress Notes (Signed)
PROGRESS NOTE   Subjective/Complaints:  Nursing notes pain issues but patient indicates that this was abdominal pain that improved after a bowel movement.  ROS: Patient denies CP, SOB, N/V/D Objective:   No results found. No results for input(s): WBC, HGB, HCT, PLT in the last 72 hours.  No results for input(s): NA, K, CL, CO2, GLUCOSE, BUN, CREATININE, CALCIUM in the last 72 hours.   Intake/Output Summary (Last 24 hours) at 09/06/2020 1118 Last data filed at 09/06/2020 0800 Gross per 24 hour  Intake 538 ml  Output 800 ml  Net -262 ml         Physical Exam: Vital Signs Blood pressure (!) 152/61, pulse 62, temperature 98.7 F (37.1 C), temperature source Oral, resp. rate 18, height '5\' 3"'$  (1.6 m), weight 80.2 kg, SpO2 96 %.  General: No acute distress Mood and affect are appropriate Heart: Regular rate and rhythm no rubs murmurs or extra sounds Lungs: Clear to auscultation, breathing unlabored, no rales or wheezes Abdomen: Positive bowel sounds, soft nontender to palpation, nondistended Extremities: No clubbing, cyanosis, or edema  Skin: cervical wound cdi with honeycomb dressing. A few scattered lacs and abrasions on legs/arms, bruises remain.  Neuro:  fairly alert, oriented. Follows commands. Able to carry conversation. No CN findings. RUE 4+/5. LUE 4, decreased sensation to LT/pain LUE>LUE. LE4- prox to 4/5 distally. No sensory findings in legs. DTR's 3+ Musculoskeletal: cervicalgia ongoing in collar.     Assessment/Plan: 1. Functional deficits which require 3+ hours per day of interdisciplinary therapy in a comprehensive inpatient rehab setting. Physiatrist is providing close team supervision and 24 hour management of active medical problems listed below. Physiatrist and rehab team continue to assess barriers to discharge/monitor patient progress toward functional and medical goals  Care Tool:  Bathing         Body parts bathed by helper: Right arm, Left arm, Chest, Abdomen, Front perineal area, Buttocks, Right upper leg, Left upper leg, Right lower leg, Left lower leg, Face     Bathing assist Assist Level: Dependent - Patient 0%     Upper Body Dressing/Undressing Upper body dressing   What is the patient wearing?: Pull over shirt    Upper body assist Assist Level: Maximal Assistance - Patient 25 - 49%    Lower Body Dressing/Undressing Lower body dressing      What is the patient wearing?: Pants     Lower body assist Assist for lower body dressing: Total Assistance - Patient < 25%     Toileting Toileting    Toileting assist Assist for toileting: 2 Helpers     Transfers Chair/bed transfer  Transfers assist     Chair/bed transfer assist level: Moderate Assistance - Patient 50 - 74%     Locomotion Ambulation   Ambulation assist      Assist level: 2 helpers Assistive device: Walker-rolling Max distance: 15'   Walk 10 feet activity   Assist     Assist level: 2 helpers Assistive device: Walker-rolling   Walk 50 feet activity   Assist Walk 50 feet with 2 turns activity did not occur: Safety/medical concerns         Walk 150 feet  activity   Assist Walk 150 feet activity did not occur: Safety/medical concerns         Walk 10 feet on uneven surface  activity   Assist Walk 10 feet on uneven surfaces activity did not occur: Safety/medical concerns         Wheelchair     Assist Will patient use wheelchair at discharge?: No             Wheelchair 50 feet with 2 turns activity    Assist            Wheelchair 150 feet activity     Assist          Blood pressure (!) 152/61, pulse 62, temperature 98.7 F (37.1 C), temperature source Oral, resp. rate 18, height '5\' 3"'$  (1.6 m), weight 80.2 kg, SpO2 96 %.     Medical Problem List and Plan: 1.  Gait abnormality secondary to severe spondylitic myelopathy with severe  stenosis C2-3 3-4/central cord syndrome.  Status post cervical arthrodesis with posterior cervical bilateral laminectomy 08/28/2020.  Cervical collar as directed             -patient may shower             -ELOS/Goals: Supervision/Min A 17-21 days.             -Continue CIR therapies including PT, OT, and SLP   -fitted with smaller [place] J collar which fits much better.   2.  Antithrombotics: -DVT/anticoagulation: Subcutaneous heparin.  Check vascular study Mechanical: Sequential compression devices, below knee Bilateral lower extremities Pharmaceutical: sq hep             -antiplatelet therapy: N/A 3. Pain Management: Oxycodone as needed  Taking Tylenol 3-4 times per day Oxycodone ordered every 4 hours as needed generally taking every 6 hours Is not taking any nerve pain medicine.  Has intolerance to gabapentin due to sleepiness question at what dose. Is taking tizanidine 2 mg twice daily as needed             Monitor with increased exertion  -dc'ed robaxin d/t sedation.   -continue tizanidine trial 4. Mood: Zoloft 50 mg daily, Lamictal 200 mg twice daily             -antipsychotic agents: Abilify 10 mg daily  -mood fair at present. Continue to monitor 5. Neuropsych: This patient is?  Fully capable of making decisions on her own behalf. 6. Skin/Wound Care: Routine skin checks 7. Fluids/Electrolytes/Nutrition: Routine in and outs             CMP reviewed. Add protein supp for low albumin 8.  Hypertension.  Hydralazine 25 mg twice daily, Avapro 300 mg daily, Norvasc 10 mg daily             borderline. Tizanidine helping too Vitals:   09/06/20 0303 09/06/20 0613  BP: (!) 139/59 (!) 152/61  Pulse: (!) 58 62  Resp: 18 18  Temp: 97.8 F (36.6 C) 98.7 F (37.1 C)  SpO2: 95% 96%   Lability check orthostatic vitals, no orthostatic drop.  Will increase hydralazine to 3 times daily 9.  COPD/history of tobacco use.  Continue inhalers as directed.  Monitor oxygen saturations  -O2 [place] as  needed 10.  GERD.  Protonix 11.  CAD with history of stenting.  Plan to discuss with neurosurgery when to resume low-dose aspirin 12.  Diastolic congestive heart failure.  Lasix 20 mg daily.  Monitor for any signs of  fluid overload   Filed Weights   09/04/20 0419 09/05/20 0307 09/06/20 0334  Weight: 81 kg 79.6 kg 80.2 kg    -weight back down today to baseline   -monitor daily 13. Neurogenic bowel: still no bm yesterday despite sorbitol and sse    8/19 mg citrate on backorder -repeat sorbitol and SSE later today if needed -added linzess 14. Neurogenic bladder:  -continue voiding trial  -I/O cath prn to keep volumes between 300-500cc, patient is not emptying completely    LOS: 4 days A FACE TO FACE EVALUATION WAS PERFORMED  Charlett Blake 09/06/2020, 11:18 AM

## 2020-09-06 NOTE — Progress Notes (Signed)
Occupational Therapy Session Note  Patient Details  Name: [name] MRN: [id number] Date of Birth: Jan 21, 1945  Today's Date: 09/06/2020 OT Individual Time: 1305-1330 OT Individual Time Calculation (min): 25 min    Short Term Goals: Week 1:  OT Short Term Goal 1 (Week 1): patient will complete bed mobility and SPT with mod A OT Short Term Goal 2 (Week 1): patient will complete eating, grooming and upper body adl with mod A using AD/built up handles OT Short Term Goal 3 (Week 1): patient will tolerate unsupported sitting with min A OT Short Term Goal 4 (Week 1): patient will complete toileting max A of one  Skilled Therapeutic Interventions/Progress Updates:    Pt resting in bed upon arrival. LPN presnet to administer medications. Initial focus on bed mobility; rolling R/L with min A to facilitate repositioning of bed pad and reposition in bed. Discussed home arrangements. Pt used Rollator at home but also has RW. Pt sleeps in recliner for comfort. Pt reports increased back pain when sleeping in standard bed. Home is RW accessible. O2 sats>90% on 2L O2. Pt's friend to purchase TTB tomorrow. Pt remained in bed with all needs within reach and bed alarm activated. [name] (friend) present.  Therapy Documentation Precautions:  Precautions Precautions: Cervical, Fall Precaution Comments: watch O2 sats Required Braces or Orthoses: Cervical Brace Cervical Brace: Hard collar, At all times Restrictions Weight Bearing Restrictions: No  Pain:  Pt c/o 10/10 neck and back pain; LPN aware and med admin, repositioned    Therapy/Group: Individual Therapy  Leroy Libman 09/06/2020, 1:32 PM

## 2020-09-06 NOTE — Progress Notes (Signed)
Nurse educated pt on the importance of water intake to help with kidney and bladder function. Pt verbalized understanding. Agreed to drink one bottle of water this evening and one bottle a day.

## 2020-09-07 MED ORDER — OXYCODONE HCL 5 MG PO TABS
10.0000 mg | ORAL_TABLET | Freq: Four times a day (QID) | ORAL | Status: DC | PRN
  Administered 2020-09-07 – 2020-09-08 (×2): 10 mg via ORAL
  Filled 2020-09-07 (×2): qty 2

## 2020-09-07 MED ORDER — OXYCODONE-ACETAMINOPHEN 5-325 MG PO TABS
2.0000 | ORAL_TABLET | ORAL | Status: DC | PRN
  Administered 2020-09-07 (×2): 2 via ORAL
  Filled 2020-09-07 (×3): qty 2

## 2020-09-07 NOTE — Progress Notes (Signed)
Received a call from [place] at 20:47 reporting Ms. Ground is reaching her  tylenol max dose. Her Oxycodone will be changed to oxycodone 5 mg two tablets = 10 mg every 6 hours as needed for pain. Spoke with pharmacist and Lauren regarding the above, they verbalize understanding.

## 2020-09-07 NOTE — Progress Notes (Signed)
Speech Language Pathology Daily Session Note  Patient Details  Name: [name] MRN: [id number] Date of Birth: [birth year]  Today's Date: 09/07/2020 SLP Individual Time: 1435-1535 SLP Individual Time Calculation (min): 60 min  Short Term Goals: Week 1: SLP Short Term Goal 1 (Week 1): Patient will consume current diet (Regular solids, thin liquids) without significant oral difficulty and without overt s/s aspiration or penetration. SLP Short Term Goal 2 (Week 1): Patient will demonstrate recall of daily events and short term recall with modA cues. SLP Short Term Goal 2 - Progress (Week 1): Met SLP Short Term Goal 3 (Week 1): Patient will demonstrate awareness to errors during functional tasks, with minA cues. SLP Short Term Goal 3 - Progress (Week 1): Met SLP Short Term Goal 4 (Week 1): Patient will demonstrate adequate problem solving to complete functional and simulated ADL tasks with min-modA cues. SLP Short Term Goal 5 (Week 1): Patient will orient to time/date, with minA cues for using memory aids/compensatory strategies. SLP Short Term Goal 5 - Progress (Week 1): Met SLP Short Term Goal 6 (Week 1): Patient will elaborate, fully respond to open-ended questions with modA cues to do so. SLP Short Term Goal 6 - Progress (Week 1): Met  Skilled Therapeutic Interventions:Skilled ST services focused on education, swallow and cognitive skills. Pt's friend/roommate, [name] ws present for treatment session. Pt expressed pt being at cognitive baseline with increase alertness and clarity. SLP provided subsections of formal high level cognitive linguistic assessment CLQT, Pt and [name] supported baseline function after each subtest that is further impacted by chronic vision deficits. Pt scored WFL for short term recall and generative naming task, symbol trials and symbol cancellation were impacted by vision. SLP provided education need for assistance to check for errors due to poor vision, Pt and [name]  agreed, as well as [name] to provide assistance with higher level tasks to check for errors. SLP provided dys 1 textures, dys 2 textures, dys 3 textures and thin liquid trials upright at 30 degrees due to pain. Pt demonstrated timely swallow with all assessed textures, no overt s/s aspiration nor no complaints of pain. Pt requested only upgrade to dys 2 tetxures verse dys 3 textures due to fear of neck pain. SLP upgraded diet to dys 2 textures and provided education pertaining to trajectory of solid advancements at home/bedside as needed. SLP will likely sign off on cognitive goals after communication with treating OT/PT and follow pt for tolerance of diet x1 piror to signing off on all ST services. Pt was left in room with visitor, call bell within reach and bed alarm set. SLP recommends to continue skilled services.     Pain Pain Assessment Pain Scale: 0-10 Pain Score: 9  Pain Type: Acute pain Pain Location: Neck Pain Orientation: Mid Pain Radiating Towards: back Pain Descriptors / Indicators: Aching;Discomfort Pain Onset: On-going Pain Intervention(s): Medication (See eMAR)  Therapy/Group: Individual Therapy  [name]  [facility] 09/07/2020, 4:04 PM

## 2020-09-07 NOTE — Progress Notes (Signed)
Occupational Therapy Session Note  Patient Details  Name: [name] MRN: [id number] Date of Birth: 25-May-1945  Today's Date: 09/07/2020 OT Individual Time: [id number] OT Individual Time Calculation (min): 70 min    Short Term Goals: Week 1:  OT Short Term Goal 1 (Week 1): patient will complete bed mobility and SPT with mod A OT Short Term Goal 2 (Week 1): patient will complete eating, grooming and upper body adl with mod A using AD/built up handles OT Short Term Goal 3 (Week 1): patient will tolerate unsupported sitting with min A OT Short Term Goal 4 (Week 1): patient will complete toileting max A of one  Skilled Therapeutic Interventions/Progress Updates:    Pt resting in bed upon arrival. Pt agreeable to sitting EOB to don pants. Declined changing shirts at this time. Supine>sit EOB with supervision. Pt requires assistance threading pants with no AD. Sit>stand from EOB with CGA. Pt initiates pulling pants over hips but requires assistance to complete task. Pt able to maintain standing balance without UE support and CGA. Discussed taking a shower once she is able to tolerate unsupported sitting for 10-15 mins. Pt with considerable neck pain with unsupported sitting. Pt returned to EOB and supine in bed with supervision. Soft tissue mobilizations to Bil upper traps. Pt with several trigger points. Some relief noted. Pain is primary deterrent to progress with BADLs. Pt remained in bed with all needs within reach and bed alarm activated.   Therapy Documentation Precautions:  Precautions Precautions: Cervical, Fall Precaution Comments: watch O2 sats Required Braces or Orthoses: Cervical Brace Cervical Brace: Hard collar, At all times Restrictions Weight Bearing Restrictions: No Pain: Pain Assessment Pain Scale: 0-10 Pain Score: 9  Pain Type: Acute pain Pain Location: Neck Pain Orientation: Mid Pain Descriptors / Indicators: Aching;Discomfort Pain Frequency: Constant Pain  Intervention(s): RN admin meds during session; soft tissue mobilizations    Therapy/Group: Individual Therapy  Leroy Libman 09/07/2020, 9:29 AM

## 2020-09-07 NOTE — Progress Notes (Signed)
Patient ID: [name], female   DOB: January 10, 1946, 75 y.o.   MRN: [id number]  SW requested walk test for pt to assist with re-certification for home o2.  Loralee Pacas, MSW, [place] Office: [phone] Cell: [phone] Fax: [phone]

## 2020-09-07 NOTE — Progress Notes (Signed)
Patient slept in brief intervals throughout the night. Requesting prn pain medications around the clock for neck and back pain. Partial effects noted, pt noted upon reassessment at times. Pt either resting or pain is at 9/10. Pt has not voided this shift, requiring intermittent in and out caths. PO water intake encouraged, as pt frequently requesting coke as only fluid intake throughout the night.

## 2020-09-07 NOTE — Progress Notes (Signed)
Physical Therapy Session Note  Patient Details  Name: [name] MRN: [id number] Date of Birth: 27-Feb-1945  Today's Date: 09/07/2020 PT Individual Time: [id number] PT Individual Time Calculation (min): 24 min   Short Term Goals: Week 1:  PT Short Term Goal 1 (Week 1): Pt will perform bed mobility consistently with modA. PT Short Term Goal 2 (Week 1): Pt will peform bed to chair transfer with minA. PT Short Term Goal 3 (Week 1): Pt will ambulate x50' with modA +1 and LRAD.  Skilled Therapeutic Interventions/Progress Updates:    Patient in supine and agreeable to PT to make up missed minutes.  Patient reports just recently got medication for pain, but still having pain.  Performed supine to sit with CGA.  Sit to stand to RW with min A.  Ambulated x 50' with RW and min A noted L knee buckling throughout.  SpO2 measured throughout and ambulated first without O2, then with O2.  See qualifying note for home O2 for detalis.  Patient endorsed fatigue.  Performed sit to supine with min A for positioning.  Left supine with call bell and needs in reach, bed alarm active.   Therapy Documentation Precautions:  Precautions Precautions: Cervical, Fall Precaution Comments: watch O2 sats Required Braces or Orthoses: Cervical Brace Cervical Brace: Hard collar, At all times Restrictions Weight Bearing Restrictions: No  Pain: Pain Assessment Pain Scale: 0-10 Pain Score: 9  Pain Type: Acute pain Pain Location: Neck Pain Orientation: Mid Pain Radiating Towards: back Pain Descriptors / Indicators: Aching Pain Onset: On-going Pain Intervention(s): Repositioned;Rest    Therapy/Group: Individual Therapy  Reginia Naas Magda Kiel, PT 09/07/2020, 4:50 PM

## 2020-09-07 NOTE — Progress Notes (Signed)
Physical Therapy Note  Patient Details  Name: [name] MRN: [id number] Date of Birth: Jun 10, 1945 Today's Date: 09/07/2020    SATURATION QUALIFICATIONS: (This note is used to comply with regulatory documentation for home oxygen)  Patient Saturations on Room Air at Rest = 90%  Patient Saturations on Room Air while Ambulating = 85%  Patient Saturations on 2 Liters of oxygen while Ambulating = 91%  Please briefly explain why patient needs home oxygen: Patient hypoxic with ambulation on RA.  Will need O2 at home.   Reginia Naas [place], [place] 09/07/2020, 4:59 PM

## 2020-09-07 NOTE — Progress Notes (Signed)
Physical Therapy Session Note  Patient Details  Name: [name] MRN: [id number] Date of Birth: 10/12/1945  Today's Date: 09/07/2020 PT Individual Time: 1100-1157 PT Individual Time Calculation (min): 57 min   Short Term Goals: Week 1:  PT Short Term Goal 1 (Week 1): Pt will perform bed mobility consistently with modA. PT Short Term Goal 2 (Week 1): Pt will peform bed to chair transfer with minA. PT Short Term Goal 3 (Week 1): Pt will ambulate x50' with modA +1 and LRAD.  Skilled Therapeutic Interventions/Progress Updates:    pt received in bed and agreeable to therapy. Reporting 9/10 pain in her neck, premedicated and therapy to tolerance. 2L O2 maintained throughout session. Bed mobility with supervision. Pt requested to use bathroom, Stand pivot transfer to [facility] with RW and min A. Sit to stand with CGA to supervision at times to RW. Tot A for hygiene and mod A for clothing management. Continent bowel void. Incontinent bladder void on standing. Pt transported to therapy gym for time management and energy conservation. Stand pivot transfer w/c<>mat table with min A. Pt performed 2 x 10 Sit to stand from mat table for improved LE strength and endurance. Gait training  with RW and min-CGA x 25 ft, x 38 ft, x 64 ft with cues for knee extension in gait. Pt unable to come fully upright d/t kyphosis and has difficulty looking forward for safety during gait. Pt then performed 2 x 10 mini squats for improved stance during gait. Pt returned to room and ambulated in room distance to bed in same manner as above, mod A sit>supine. Pt was left with all needs in reach and alarm active.   Therapy Documentation Precautions:  Precautions Precautions: Cervical, Fall Precaution Comments: watch O2 sats Required Braces or Orthoses: Cervical Brace Cervical Brace: Hard collar, At all times Restrictions Weight Bearing Restrictions: No   Therapy/Group: Individual Therapy  Mickel Fuchs 09/07/2020,  12:15 PM

## 2020-09-08 DIAGNOSIS — S14129D Central cord syndrome at unspecified level of cervical spinal cord, subsequent encounter: Secondary | ICD-10-CM

## 2020-09-08 MED ORDER — OXYCODONE HCL 5 MG PO TABS
10.0000 mg | ORAL_TABLET | ORAL | Status: DC | PRN
  Administered 2020-09-08 – 2020-09-18 (×53): 10 mg via ORAL
  Filled 2020-09-08 (×54): qty 2

## 2020-09-08 MED ORDER — LIDOCAINE HCL 1 % IJ SOLN
10.0000 mL | Freq: Once | INTRAMUSCULAR | Status: AC
  Administered 2020-09-08: 10 mL via INTRADERMAL
  Filled 2020-09-08: qty 10

## 2020-09-08 NOTE — Progress Notes (Signed)
PROGRESS NOTE   Subjective/Complaints:  Pain meds last 5-6 hours- and meds are every 6 hours- so will change to q4 hours as needed.   Also pt asking for trigger point injections, if possible- will do at 10am.     ROS:  Pt denies SOB, abd pain, CP, N/V/C/D, and vision changes  Objective:   No results found. No results for input(s): WBC, HGB, HCT, PLT in the last 72 hours.  No results for input(s): NA, K, CL, CO2, GLUCOSE, BUN, CREATININE, CALCIUM in the last 72 hours.   Intake/Output Summary (Last 24 hours) at 09/08/2020 0926 Last data filed at 09/08/2020 0700 Gross per 24 hour  Intake 354 ml  Output 3100 ml  Net -2746 ml        Physical Exam: Vital Signs Blood pressure 136/69, pulse 66, temperature 98.2 F (36.8 C), temperature source Oral, resp. rate 16, height '5\' 3"'$  (1.6 m), weight 82.9 kg, SpO2 97 %.    General: awake, alert, appropriate, sitting up in bed;  NAD HENT: conjugate gaze; oropharynx moist; wearing O2 by [place] 2L and cervical collar in place CV: regular rate; no JVD Pulmonary: CTA B/L; no W/R/R- - good air movement GI: soft, NT, ND, (+)BS Psychiatric: appropriate; interactive Neurological: Ox3  Skin: cervical wound cdi with honeycomb dressing still in place- . A few scattered lacs and abrasions on legs/arms, bruises remain.  Neuro:  fairly alert, oriented. Follows commands. Able to carry conversation. No CN findings. RUE 4+/5. LUE 4, decreased sensation to LT/pain LUE>LUE. LE4- prox to 4/5 distally. No sensory findings in legs. DTR's 3+ Musculoskeletal: cervicalgia ongoing in collar.  Tight trigger points in neck/shoulders/upper back    Assessment/Plan: 1. Functional deficits which require 3+ hours per day of interdisciplinary therapy in a comprehensive inpatient rehab setting. Physiatrist is providing close team supervision and 24 hour management of active medical problems listed  below. Physiatrist and rehab team continue to assess barriers to discharge/monitor patient progress toward functional and medical goals  Care Tool:  Bathing        Body parts bathed by helper: Right arm, Left arm, Chest, Abdomen, Front perineal area, Buttocks, Right upper leg, Left upper leg, Right lower leg, Left lower leg, Face     Bathing assist Assist Level: Dependent - Patient 0%     Upper Body Dressing/Undressing Upper body dressing   What is the patient wearing?: Pull over shirt    Upper body assist Assist Level: Maximal Assistance - Patient 25 - 49%    Lower Body Dressing/Undressing Lower body dressing      What is the patient wearing?: Pants     Lower body assist Assist for lower body dressing: Total Assistance - Patient < 25%     Toileting Toileting    Toileting assist Assist for toileting: 2 Helpers     Transfers Chair/bed transfer  Transfers assist     Chair/bed transfer assist level: Moderate Assistance - Patient 50 - 74%     Locomotion Ambulation   Ambulation assist      Assist level: 2 helpers Assistive device: Walker-rolling Max distance: 15'   Walk 10 feet activity   Assist  Assist level: 2 helpers Assistive device: Walker-rolling   Walk 50 feet activity   Assist Walk 50 feet with 2 turns activity did not occur: Safety/medical concerns         Walk 150 feet activity   Assist Walk 150 feet activity did not occur: Safety/medical concerns         Walk 10 feet on uneven surface  activity   Assist Walk 10 feet on uneven surfaces activity did not occur: Safety/medical concerns         Wheelchair     Assist Is the patient using a wheelchair?: No             Wheelchair 50 feet with 2 turns activity    Assist            Wheelchair 150 feet activity     Assist          Blood pressure 136/69, pulse 66, temperature 98.2 F (36.8 C), temperature source Oral, resp. rate 16, height  '5\' 3"'$  (1.6 m), weight 82.9 kg, SpO2 97 %.     Medical Problem List and Plan: 1.  Gait abnormality secondary to severe spondylitic myelopathy with severe stenosis C2-3 3-4/central cord syndrome.  Status post cervical arthrodesis with posterior cervical bilateral laminectomy 08/28/2020.  Cervical collar as directed             -patient may shower             -ELOS/Goals: Supervision/Min A 17-21 days  -fitted with smaller [place] J collar which fits much better.    8/24- con't PT and OT- team conference today to determine d/c date.  2.  Antithrombotics: -DVT/anticoagulation: Subcutaneous heparin.  Check vascular study Mechanical: Sequential compression devices, below knee Bilateral lower extremities Pharmaceutical: sq hep             -antiplatelet therapy: N/A 3. Pain Management: Oxycodone as needed  Taking Tylenol 3-4 times per day Oxycodone ordered every 4 hours as needed generally taking every 6 hours Is not taking any nerve pain medicine.  Has intolerance to gabapentin due to sleepiness question at what dose. Is taking tizanidine 2 mg twice daily as needed             Monitor with increased exertion  -dc'ed robaxin d/t sedation.   -continue tizanidine trial  8/23- will increase Oxycodone (get rid of percocet to Oxy '10mg'$ ) to q4 hours prn to help keep pain more stable. Trigger point injections today 4. Mood: Zoloft 50 mg daily, Lamictal 200 mg twice daily             -antipsychotic agents: Abilify 10 mg daily   8/23- mood OK per pt- con't regimen for now 5. Neuropsych: This patient is?  Fully capable of making decisions on her own behalf. 6. Skin/Wound Care: Routine skin checks 7. Fluids/Electrolytes/Nutrition: Routine in and outs             CMP reviewed. Add protein supp for low albumin 8.  Hypertension.  Hydralazine 25 mg twice daily, Avapro 300 mg daily, Norvasc 10 mg daily             borderline. Tizanidine helping too Vitals:   09/08/20 0350 09/08/20 0834  BP: (!) 163/74 136/69   Pulse: 64 66  Resp: 18 16  Temp: 97.8 F (36.6 C) 98.2 F (36.8 C)  SpO2: 93% 97%   Lability check orthostatic vitals, no orthostatic drop.  Will increase hydralazine to 3 times daily  8/23-  BP 130s-160s- will give 1 more day before changing meds 9.  COPD/history of tobacco use.  Continue inhalers as directed.  Monitor oxygen saturations  -O2 [place] as needed 10.  GERD.  Protonix 11.  CAD with history of stenting.  Plan to discuss with neurosurgery when to resume low-dose aspirin 12.  Diastolic congestive heart failure.  Lasix 20 mg daily.  Monitor for any signs of fluid overload   Filed Weights   09/06/20 0334 09/07/20 0427 09/08/20 0352  Weight: 80.2 kg 80.3 kg 82.9 kg    -weight back down today to baseline   -monitor daily 13. Neurogenic bowel: still no bm yesterday despite sorbitol and sse    8/19 mg citrate on backorder -repeat sorbitol and SSE later today if needed -added linzess 14. Neurogenic bladder:  -continue voiding trial  -I/O cath prn to keep volumes between 300-500cc, patient is not emptying completely  Patient got for trigger point injections for myofascial tightness.  Consent done and on chart.  Cleaned areas with alcohol and injected using a 27 gauge 1.5 inch needle  Injected  Using 1% Lidocaine with no EPI  Upper traps R only Levators- couldn't do due to incision/dressing.  Posterior scalenes B/L    Pt reports being a little looser afterwards, but pain around the same.    I spent a total of 42 minutes on total care today- >50% on coordination of care- doing trigger point injections as well as consent.    LOS: 6 days A FACE TO FACE EVALUATION WAS PERFORMED  Taela Charbonneau 09/08/2020, 9:26 AM

## 2020-09-08 NOTE — Progress Notes (Signed)
Occupational Therapy Session Note  Patient Details  Name: [name] MRN: [id number] Date of Birth: 08-23-1945  Today's Date: 09/08/2020 OT Individual Time: [id number] OT Individual Time Calculation (min): 42 min    Short Term Goals: Week 1:  OT Short Term Goal 1 (Week 1): patient will complete bed mobility and SPT with mod A OT Short Term Goal 2 (Week 1): patient will complete eating, grooming and upper body adl with mod A using AD/built up handles OT Short Term Goal 3 (Week 1): patient will tolerate unsupported sitting with min A OT Short Term Goal 4 (Week 1): patient will complete toileting max A of one   Skilled Therapeutic Interventions/Progress Updates:    Pt greeted at time of session sitting up in recliner with family/friends present, agreeable to OT sessio nand 9/10 neck pain which has been ongoing. RN present at beginning of session for med pass including pain meds. Inspected C-collar per pt request and noted O2 tubing underneath collar, adjusted and brought O2 tubing to outside of collar. Sit > stand and stand pivot to wheelchair CGA w/ RW, flexed posture and unable to correct. Switched over to portable O2 tank, remained on 1L throughout session. Transported to gym dependent for time, focusing on BUE ROM and functional use with UE ring arch for 2 x all rings each hand with Min facilitation for R hand. Encouraging pt to use BUEs throughout for locking brakes, reaching for objects, etc. Stand pivot back to bed CGA for nursing to perform bladder scan. Scoot up in bed 2 helpers for comfort. Positioned with pillows behind head, alarm on call bell in reach and connected to wall O2.    Therapy Documentation Precautions:  Precautions Precautions: Cervical, Fall Precaution Comments: watch O2 sats Required Braces or Orthoses: Cervical Brace Cervical Brace: Hard collar, At all times Restrictions Weight Bearing Restrictions: No    Therapy/Group: Individual Therapy  Viona Gilmore 09/08/2020, 7:26 AM

## 2020-09-08 NOTE — Progress Notes (Signed)
Physical Therapy Session Note  Patient Details  Name: [name] MRN: [id number] Date of Birth: 1945-11-04  Today's Date: 09/08/2020 PT Individual Time: 1101-1156 PT Individual Time Calculation (min): 55 min   Short Term Goals: Week 1:  PT Short Term Goal 1 (Week 1): Pt will perform bed mobility consistently with modA. PT Short Term Goal 2 (Week 1): Pt will peform bed to chair transfer with minA. PT Short Term Goal 3 (Week 1): Pt will ambulate x50' with modA +1 and LRAD.  Skilled Therapeutic Interventions/Progress Updates:  Pt received sitting in recliner in room, O2 on 1L, reported pain as 9/10 in posterior head/neck. Pt had received lidocaine injection earlier in morning and pain meds prior to session. Emphasis of session on holistic pain relief and gait training. Sit <>stand from recliner w/RW and stand pivot to [facility] w/CGA. Pt's friend, [name], present for session and accompanied pt and therapist. Pt transported w/total A for time management to 1st floor outdoor patio. Pt ambulated 45' x3 w/RW and CGA, WC follow from [facility] for added safety, several minutes of rest between trials. Pt reported her COG feels off 2/2 [place] collar and kyphotic posture. Noted wide BOS for gait, decreased step clearance and length, decreased cadence and significant forward lean. Provided verbal cues to reduce kyphosis, but pt reported her neck pain was interfering. SpO2 maintained above 90% throughout session on 1L O2. Pt transported back to room w/total A and performed sit <>stand pivot from WC to recliner w/CGA. Pt was left sitting in recliner in room, reported pain as 8/10, all needs in reach.   Therapy Documentation Precautions:  Precautions Precautions: Cervical, Fall Precaution Comments: watch O2 sats Required Braces or Orthoses: Cervical Brace Cervical Brace: Hard collar, At all times Restrictions Weight Bearing Restrictions: No   Therapy/Group: Individual Therapy [name] Makinsey Pepitone, PT,  DPT  09/08/2020, 7:55 AM

## 2020-09-08 NOTE — Patient Care Conference (Signed)
Inpatient RehabilitationTeam Conference and Plan of Care Update Date: 09/08/2020   Time: 11:18 AM    Patient Name: [name]      Medical Record Number: [id number]  Date of Birth: [birth year] Sex: Female         Room/Bed: 4W04C/4W04C-01 Payor Info: Payor: MEDICARE / Plan: MEDICARE PART A AND B / Product Type: *No Product type* /    Admit Date/Time:  09/02/2020  1:59 PM  Primary Diagnosis:  Central cord syndrome [facility])  Hospital Problems: Principal Problem:   Central cord syndrome [facility])    Expected Discharge Date: Expected Discharge Date: 09/18/20  Team Members Present: Physician leading conference: Dr. Courtney Heys Social Worker Present: Loralee Pacas, [place] Nurse Present: Dorthula Nettles, RN PT Present: Leavy Cella, PT OT Present: Roanna Epley, [place], OT SLP Present: Charolett Bumpers, SLP PPS Coordinator present : Ileana Ladd, PT     Current Status/Progress Goal Weekly Team Focus  Bowel/Bladder   Continent of bowel, inability to urinate; LBM 8/21  Regain ability to urinate, remain continent of bowel with minimal assistance  Assess bowel and bladder needs q shift and PRN and offer toileting q 2 hours while aware   Swallow/Nutrition/ Hydration   Dys 2 textures and thin liquids, due to pain perfence opening oral cavity  Mod I - goal met  diet tolerance, education and d/c   ADL's   bathing-mod A: UB dressing-max A: LB dressing-max A; transfers-CGA; pain limiting activity tolerance  bathing-min A: LB dressing-min A: toileting-min A; functional transfers-CGA/supervisoin  BADL retraining; activity tolerance, education; pain mgmt   Mobility   CGA STS, supervision bed mobility, min A bed chair transfers, gait with RW and w/c follow up to 64 ft  supervision overall, CGA stairs  gait, transfers, functional mobility   Communication             Safety/Cognition/ Behavioral Observations  baseline  Mod I - goal met      Pain   Pt rates neck pain 9/10; oxycodone  $RemoveBe'10mg'[id number]$  administered  Pain <3/10  Assess pain q shfit and prn, medicate as directed   Skin   Cervical incision, honey comb dressing intact  Pt remains free of infection or skin breakdown  Assess skin q shift and prn     Discharge Planning:  D/c to home with assistance from roommate [place] who can provide supervision.   Team Discussion: Changed Percocet to Oxy IR q 4 hr. Did trigger point injections today. Remove honeycomb dressing and apply dry dressing prn. Continent bowel, incontinent bladder, on-going pain, generalized bruising, bottom appears red. Making progress each day. Contact guard STS, needs assist with upper body dressing. Roommate provides some assist. Supervision/contact guard goals. Tolerated sitting in recliner. Supervision bed mobility, contact guard STS, ambulating about 50 ft. SLP discharged her yesterday. Dys 2, thin liquids. Patient on target to meet rehab goals: yes  *See Care Plan and progress notes for long and short-term goals.   Revisions to Treatment Plan:  MD adjusting medications.  Teaching Needs: Family education, medication management, pain management, skin/wound care, transfer training, gait training, balance training, endurance training, safety awareness.  Current Barriers to Discharge: Decreased caregiver support, Medical stability, Home enviroment access/layout, Incontinence, Neurogenic bowel and bladder, Weight, Weight bearing restrictions, and Medication compliance  Possible Resolutions to Barriers: Continue current medications, provide emotional support.     Medical Summary Current Status: pain biggest issue  for central cord syndrome- incontinent of bladder, continent of bowel; wearing cervical collar; also doing in/out  caths as needed; blanchable redness on backside  Barriers to Discharge: Decreased family/caregiver support;Home enviroment access/layout;Medical stability;Neurogenic Bowel & Bladder;Incontinence;Weight bearing restrictions;Other  (comments)  Barriers to Discharge Comments: chronic O2-needs walk test for home O2; has [name]- roommate- only Chiropractor; Possible Resolutions to [employer]: changed percocet to oxy and  to q4 hours prn and did trigger point injections today- R upper traps and B/L scalenes; transfers CGA; getting better daily;  ambulating ~50 ft min A RW; d/c'd from SLP today- D2 diet- opening oral cavity painful- so can upgrade as pain improves; d/c 09/18/20   Continued Need for Acute Rehabilitation Level of Care: The patient requires daily medical management by a physician with specialized training in physical medicine and rehabilitation for the following reasons: Direction of a multidisciplinary physical rehabilitation program to maximize functional independence : Yes Medical management of patient stability for increased activity during participation in an intensive rehabilitation regime.: Yes Analysis of laboratory values and/or radiology reports with any subsequent need for medication adjustment and/or medical intervention. : Yes   I attest that I was present, lead the team conference, and concur with the assessment and plan of the team.   Cristi Loron 09/08/2020, 4:14 PM

## 2020-09-08 NOTE — Progress Notes (Signed)
Occupational Therapy Session Note  Patient Details  Name: [name] MRN: [id number] Date of Birth: Jun 05, 1945  Today's Date: 09/08/2020 OT Individual Time: [id number] OT Individual Time Calculation (min): 70 min    Short Term Goals: Week 1:  OT Short Term Goal 1 (Week 1): patient will complete bed mobility and SPT with mod A OT Short Term Goal 2 (Week 1): patient will complete eating, grooming and upper body adl with mod A using AD/built up handles OT Short Term Goal 3 (Week 1): patient will tolerate unsupported sitting with min A OT Short Term Goal 4 (Week 1): patient will complete toileting max A of one  Skilled Therapeutic Interventions/Progress Updates:    Pt resting in bed upon arrival. Pt initially voiced concerns regarding staff service/issues from previous day. Therapeutic listening employed. Pt agreeable to OTA sharing her concerns with Assistant Nursing Director. OT intervention with focus on bed moblity, sitting balance, toileting, toilet transfers, furniture transfers, dressing, and discharge planning to increase independence with BADLs and prepare dor discharge home. Supine<>sit EOB with supervision (HOB elevated). Pt sleeps in recliner at home 2/2 "back issues." Sit<>stand with CGA. BSC tranfser with CGA. Toileting tasks with mod A. LB dressing with mod A. UB dressing with mod A. Pt transferred to recliner with CGA and remained in recliner with all needs within reach and seat alarm activated.   Therapy Documentation Precautions:  Precautions Precautions: Cervical, Fall Precaution Comments: watch O2 sats Required Braces or Orthoses: Cervical Brace Cervical Brace: Hard collar, At all times Restrictions Weight Bearing Restrictions: No Pain: Pain Assessment Pain Scale: 0-10 Pain Score: 9  Pain Type: Acute pain Pain Location: Neck Pain Orientation: Mid Pain Radiating Towards: back Pain Descriptors / Indicators: Aching Pain Frequency: Constant Pain Intervention(s):  Meds admins during session and repositioned    Therapy/Group: Individual Therapy  Leroy Libman 09/08/2020, 9:30 AM

## 2020-09-08 NOTE — Progress Notes (Signed)
Speech Language Pathology Discharge Summary  Patient Details  Name: [name] MRN: [id number] Date of Birth: [birth year]  Today's Date: 09/08/2020 SLP Individual Time: 1025-1100 SLP Individual Time Calculation (min): 35 min Missed time: 10 minutes due to nursing needs  Skilled Therapeutic Interventions: Patient agreeable to skilled ST intervention with focus on diet tolerance and dysphagia education. Patient endorsed tolerance of Dys 2 diet and thin liquids without s/sx of aspiration, mastication difficulty, or pain since diet was advanced yesterday. Patient reported preference to continue Dys 2 diet at this time as least restrictive diet, as she is concerned that Dys 3+ textures would result in increased discomfort. SLP reinforced diet eduction. Provided patient with Dys 2 and Dys 3 textures handouts. Patient appears appropriate to advance diet as tolerated. Patient verbalized understanding through teach back. Considering patient is at cognitive baseline and education with patient and friend/roommate has been completed, plan to sign off from ST services at this time. Patient in agreement. Patient was left in chair with alarm activated and immediate needs within reach at end of session.   Patient has met 7 of 7 long term goals.  Patient to discharge at overall Modified Independent;Supervision level.   Reasons goals not met:  NA; all goals met.    Clinical Impression/Discharge Summary:   Patient has made functional gains and has met 7 of 7 long-term goals this admission. Patient reports being at cognitive baseline with increased alertness and clarity. This was also supported by her friend/roommate [name] who also endorsed pt is at cognitive baseline, with increased support needed for vision deficits. Patient is overall mod I-to-supervision level for cognitive tasks. Patient's care partner is independent to provide the necessary cognitive assistance at discharge. Patient is currently tolerating Dys  2 textures and thin liquids and mod I for implementation of safe swallow precautions and strategies. She would be appropriate to advance diet as tolerated depending on her level of comfort/discomfort. Diet education has been provided along with handouts for solid advancement at home/bedside as tolerated. Patient verbalized understanding of current diet and advancement through teach back. Follow-up SLP services are not indicated at this time due to cognitive baseline and no additional skilled needs at this time.   Care Partner:  Caregiver Able to Provide Assistance: Yes  Type of Caregiver Assistance: Cognitive;Physical  Recommendation:  None     Equipment: NA   Reasons for discharge: Treatment goals met (Patient at baseline cognitive status, and tolerating Dys 2 textures with preference to maintain this diet at this time)   Patient/Family Agrees with Progress Made and Goals Achieved: Yes   Patty Sermons, M.S., CCC-SLP   Porshia Blizzard T Jamil Castillo 09/08/2020, 12:50 PM

## 2020-09-08 NOTE — Progress Notes (Signed)
Patient ID: AMARYS SLIWINSKI, female   DOB: [birth year], 75 y.o.   MRN: [id number]  SW met with pt and [name] in room to provide updates from team conference, and d/c date 9/2. SW informed pt goal is for her to be supervision level of care and hopefully not require any physical assistance. SW shared that documents were uploaded for oxygen to be re-certified since walk test completed yesterday. SW received contact information for preferred pharmacy: [facility]. SW will continue to provide updates as aware of care needs.   SW waiting on updates from [place] with regard to oxygen document provided.   Loralee Pacas, MSW, [place] Office: [phone] Cell: [phone] Fax: [phone]

## 2020-09-08 NOTE — Plan of Care (Signed)
  Problem: RH Swallowing Goal: LTG Patient will consume least restrictive diet using compensatory strategies with assistance (SLP) Description: LTG:  Patient will consume least restrictive diet using compensatory strategies with assistance (SLP) Outcome: Completed/Met Goal: LTG Patient will participate in dysphagia therapy to increase swallow function with assistance (SLP) Description: LTG:  Patient will participate in dysphagia therapy to increase swallow function with assistance (SLP) Outcome: Completed/Met   Problem: RH Cognition - SLP Goal: RH LTG Patient will demonstrate orientation with cues Description:  LTG:  Patient will demonstrate orientation to person/place/time/situation with cues (SLP)   Outcome: Completed/Met   Problem: RH Problem Solving Goal: LTG Patient will demonstrate problem solving for (SLP) Description: LTG:  Patient will demonstrate problem solving for basic/complex daily situations with cues  (SLP) Outcome: Completed/Met   Problem: RH Memory Goal: LTG Patient will demonstrate ability for day to day (SLP) Description: LTG:   Patient will demonstrate ability for day to day recall/carryover during cognitive/linguistic activities with assist  (SLP) Outcome: Completed/Met   Problem: RH Attention Goal: LTG Patient will demonstrate this level of attention during functional activites (SLP) Description: LTG:  Patient will will demonstrate this level of attention during functional activites (SLP) Outcome: Completed/Met   Problem: RH Awareness Goal: LTG: Patient will demonstrate awareness during functional activites type of (SLP) Description: LTG: Patient will demonstrate awareness during functional activites type of (SLP) Outcome: Completed/Met

## 2020-09-09 DIAGNOSIS — N319 Neuromuscular dysfunction of bladder, unspecified: Secondary | ICD-10-CM

## 2020-09-09 MED ORDER — TAMSULOSIN HCL 0.4 MG PO CAPS
0.4000 mg | ORAL_CAPSULE | Freq: Every day | ORAL | Status: DC
  Administered 2020-09-09 – 2020-09-17 (×9): 0.4 mg via ORAL
  Filled 2020-09-09 (×9): qty 1

## 2020-09-09 NOTE — Progress Notes (Signed)
Occupational Therapy Session Note  Patient Details  Name: [name] MRN: [id number] Date of Birth: 1945/08/11  Today's Date: 09/09/2020 OT Individual Time: [id number] OT Individual Time Calculation (min): 68 min    Short Term Goals: Week 2:  OT Short Term Goal 1 (Week 2): STG=LTG 2/2 ELOS (continue towards supervision/min A goals)  Skilled Therapeutic Interventions/Progress Updates:    Pt resting in bed upon arrival. Pain as noted below. OT intervention with focus on LB dressing, UB dressing, bed mobility, sit<>stand, standing balance, functional transfers, and activity tolerance to increase independence with BADLs. Supine>sit EOB with supervision. Sit<>stand X 6 during dressing tasks and with functional transfers-CGA. LB dressing with mod A. UB dressing with mod A seated EOB. Pt required multiple rest breaks 2/2 increase pain seated unsupported EOB. Stand pivot transfer to recliner with CGA. Pt remained in recliner with all needs within reach. Pt called for pain meds during session. Discussed home arrangement progress. Informed pt that HHOT/HHPT will be recommended. Pt pleased.  Therapy Documentation Precautions:  Precautions Precautions: Cervical, Fall Precaution Comments: watch O2 sats Required Braces or Orthoses: Cervical Brace Cervical Brace: Hard collar, At all times Restrictions Weight Bearing Restrictions: No  Pain: Pain Assessment Pain Scale: 0-10 Pain Score: 9  Pain Type: Acute pain Pain Location: Neck Pain Orientation: Mid Pain Radiating Towards: back Pain Descriptors / Indicators: Aching;Spasm;Discomfort Pain Frequency: Constant Pain Intervention(s): Medications administered during session and repositioned in recliner   Therapy/Group: Individual Therapy  Leroy Libman 09/09/2020, 9:29 AM

## 2020-09-09 NOTE — Progress Notes (Signed)
Occupational Therapy Weekly Progress Note  Patient Details  Name: [name] MRN: [id number] Date of Birth: [birth year]  Beginning of progress report period: September 03, 2020 End of progress report period: September 09, 2020  Patient has met 4 of 4 short term goals.  Pt is making steady progress with BADLs and functional transfers since admission. Pt continues to experience considerable neck pain but continues to participate in therapies. Supine<>sit EOB with supervision with HOB elevated (pt sleeps in recliner at home.) Sit<>stand and functional transfers with CGA. Pt requires mod A for all dressing tasks. Pt requires mod A for toileting tasks and bathing tasks. Pt is tolerating sitting in recliner throughout the day and completes dressing tasks seated EOB. Pt is pleased with progress.  Patient continues to demonstrate the following deficits: muscle weakness and acute pain, decreased cardiorespiratoy endurance, impaired timing and sequencing, unbalanced muscle activation, and decreased coordination, and decreased standing balance, decreased postural control, and decreased balance strategies and therefore will continue to benefit from skilled OT intervention to enhance overall performance with BADL, iADL, and Reduce care partner burden.  Patient progressing toward long term goals..  Continue plan of care.  OT Short Term Goals Week 1:  OT Short Term Goal 1 (Week 1): patient will complete bed mobility and SPT with mod A OT Short Term Goal 1 - Progress (Week 1): Met OT Short Term Goal 2 (Week 1): patient will complete eating, grooming and upper body adl with mod A using AD/built up handles OT Short Term Goal 2 - Progress (Week 1): Met OT Short Term Goal 3 (Week 1): patient will tolerate unsupported sitting with min A OT Short Term Goal 3 - Progress (Week 1): Met OT Short Term Goal 4 (Week 1): patient will complete toileting max A of one OT Short Term Goal 4 - Progress (Week 1): Met Week 2:  OT  Short Term Goal 1 (Week 2): STG=LTG 2/2 ELOS (continue towards supervision/min A goals)   Therapy Documentation Precautions:  Precautions Precautions: Cervical, Fall Precaution Comments: watch O2 sats Required Braces or Orthoses: Cervical Brace Cervical Brace: Hard collar, At all times Restrictions Weight Bearing Restrictions: No  Leroy Libman 09/09/2020, 9:19 AM

## 2020-09-09 NOTE — Progress Notes (Signed)
Physical Therapy Session Note  Patient Details  Name: [name] MRN: [id number] Date of Birth: 28-Jun-1945  Today's Date: 09/09/2020 PT Individual Time: 1050-1206 and 1320-1425 PT Individual Time Calculation (min): 76 min and 65 min  Short Term Goals: Week 1:  PT Short Term Goal 1 (Week 1): Pt will perform bed mobility consistently with modA. PT Short Term Goal 2 (Week 1): Pt will peform bed to chair transfer with minA. PT Short Term Goal 3 (Week 1): Pt will ambulate x50' with modA +1 and LRAD.  Skilled Therapeutic Interventions/Progress Updates: Pt presented in recliner agreeable to therapy. Pt states pain 8/10, premedicated. Pt also requesting to put lotion on butt as feels raw and burning. Pt performed Sit to stand from recliner with CGA and RW and upon inspection PTA noted small skin tear on L buttock. [name], LPN informed, foam dressing applied, and PTA changed w/c cushion. PTA also provided edu on pressure relief when in chair to be performed at lean x1/hr. Pt then performed ambulatory transfer to w/c with RW and CGA. Pt transported to rehab gym for energy conservation and participated in obstacle course 58ft x 2 including thresholds and weaving through cones. Pt demonstrated good safety with obstacle negotiation and good safety with RW management. Pt also participated in seated ball taps using lightweight dowel for shoulder flexion to 90 degrees 2 x 25 with brief rest between. Pt also participated in several bouts of cornhole in standing using LUE and performing reaching outside BOS with emphasis on anterior and lateral reaching. Pt then transported partial distance to room and ambulated with RW remaining distance ~72ft with RW and CGA to EOB as nsg request return to bed for cath. Once at EOB pt performed sit to supine with supervision and use of bed features. Pt left in bed at end of session with nsg present and current needs met.   Tx2: Pt presented in bed with friend [name] present  agreeable to therapy. Pt states pain 10/10 and meds were due 30 min prior. Multiple rest breaks provided during therapy for pain management. Nsg notified and meds provided during session. Pt performed supine to sit with supervision and use of bed features to EOB. Performed ambulatory transfer to w/c with RW CGA overall. Pt transported to rehab gym for time management and energy conservation. Participated in STS 2 x 5 with hands on BLE for strengthening and static balance upon standing. Performed LAQ 2 x 10 bilaterally. Pt then transported to ortho gym and participated in BITS visual scanning complex array for dynamic reaching and scanning. Pt was able to complete with 62% accuracy and required 1 hand over hand assist with far reach of LUE to R upper quadrant of screen.Pt transported back to room and performed ambulatory transfer to EOB. Pt returned to supine with supervision and use of bed features. Pt repositioned to comfort and left with bed alarm on, call bell within reach and NT present for vitals.        Therapy Documentation Precautions:  Precautions Precautions: Cervical, Fall Precaution Comments: watch O2 sats Required Braces or Orthoses: Cervical Brace Cervical Brace: Hard collar, At all times Restrictions Weight Bearing Restrictions: No General:   Vital Signs: Therapy Vitals Pulse Rate: 78 Resp: 20 BP: 123/64 Patient Position (if appropriate): Lying Oxygen Therapy SpO2: 96 % O2 Device: Nasal Cannula O2 Flow Rate (L/min): 2 L/min Pain: Pain Assessment Pain Scale: 0-10 Pain Score: 9  Pain Type: Acute pain Pain Location: Neck Pain Orientation: Mid Pain Descriptors /  Indicators: Aching;Discomfort Pain Intervention(s): Medication (See eMAR)    Therapy/Group: Individual Therapy  [name], PTA  09/09/2020, 4:08 PM

## 2020-09-09 NOTE — Progress Notes (Signed)
PROGRESS NOTE   Subjective/Complaints:  Pt reports pain is doing better- thinks is a combo of pain meds q4 hours prn as well as trigger point injections- slepy somewhat better as well.  Was 10/10- now "9/10".  Also has a bad HA this AM.   Per staff, still requiring multiple caths- wil start Flomax to help with voiding.   ROS:   Pt denies SOB, abd pain, CP, N/V/C/D, and vision changes  Objective:   No results found. No results for input(s): WBC, HGB, HCT, PLT in the last 72 hours.  No results for input(s): NA, K, CL, CO2, GLUCOSE, BUN, CREATININE, CALCIUM in the last 72 hours.   Intake/Output Summary (Last 24 hours) at 09/09/2020 1937 Last data filed at 09/09/2020 1854 Gross per 24 hour  Intake 420 ml  Output 1798 ml  Net -1378 ml        Physical Exam: Vital Signs Blood pressure 123/64, pulse 78, temperature 98.6 F (37 C), temperature source Oral, resp. rate 20, height '5\' 3"'$  (1.6 m), weight 79 kg, SpO2 96 %.     General: awake, alert, appropriate, sitting up straight in bed; O2 via [place] 2L; nurse in room; NAD HENT: conjugate gaze; oropharynx moist- cervical collar in place CV: regular rate; no JVD Pulmonary: CTA B/L; no W/R/R- good air movement GI: soft, NT, ND, (+)BS Psychiatric: appropriate; interactive Neurological: Ox3  Skin: cervical wound cdi with dry dressing. - . A few scattered lacs and abrasions on legs/arms, bruises remain.  Neuro:  fairly alert, oriented. Follows commands. Able to carry conversation. No CN findings. RUE 4+/5. LUE 4, decreased sensation to LT/pain LUE>LUE. LE4- prox to 4/5 distally. No sensory findings in legs. DTR's 3+ Musculoskeletal: cervicalgia ongoing in collar.  Tight trigger points in neck/shoulders/upper back    Assessment/Plan: 1. Functional deficits which require 3+ hours per day of interdisciplinary therapy in a comprehensive inpatient rehab setting. Physiatrist is  providing close team supervision and 24 hour management of active medical problems listed below. Physiatrist and rehab team continue to assess barriers to discharge/monitor patient progress toward functional and medical goals  Care Tool:  Bathing        Body parts bathed by helper: Right arm, Left arm, Chest, Abdomen, Front perineal area, Buttocks, Right upper leg, Left upper leg, Right lower leg, Left lower leg, Face     Bathing assist Assist Level: Dependent - Patient 0%     Upper Body Dressing/Undressing Upper body dressing   What is the patient wearing?: Pull over shirt    Upper body assist Assist Level: Moderate Assistance - Patient 50 - 74%    Lower Body Dressing/Undressing Lower body dressing      What is the patient wearing?: Pants     Lower body assist Assist for lower body dressing: Minimal Assistance - Patient > 75%     Toileting Toileting    Toileting assist Assist for toileting: Moderate Assistance - Patient 50 - 74%     Transfers Chair/bed transfer  Transfers assist     Chair/bed transfer assist level: Contact Guard/Touching assist     Locomotion Ambulation   Ambulation assist      Assist level:  Contact Guard/Touching assist Assistive device: Walker-rolling Max distance: 25f   Walk 10 feet activity   Assist     Assist level: Contact Guard/Touching assist Assistive device: Walker-rolling   Walk 50 feet activity   Assist Walk 50 feet with 2 turns activity did not occur: Safety/medical concerns  Assist level: Contact Guard/Touching assist Assistive device: Walker-rolling    Walk 150 feet activity   Assist Walk 150 feet activity did not occur: Safety/medical concerns         Walk 10 feet on uneven surface  activity   Assist Walk 10 feet on uneven surfaces activity did not occur: Safety/medical concerns   Assist level: Contact Guard/Touching assist Assistive device: Walker-rolling   Wheelchair     Assist Is  the patient using a wheelchair?: No             Wheelchair 50 feet with 2 turns activity    Assist            Wheelchair 150 feet activity     Assist          Blood pressure 123/64, pulse 78, temperature 98.6 F (37 C), temperature source Oral, resp. rate 20, height '5\' 3"'$  (1.6 m), weight 79 kg, SpO2 96 %.     Medical Problem List and Plan: 1.  Gait abnormality secondary to severe spondylitic myelopathy with severe stenosis C2-3 3-4/central cord syndrome.  Status post cervical arthrodesis with posterior cervical bilateral laminectomy 08/28/2020.  Cervical collar as directed             -patient may shower             -ELOS/Goals: Supervision/Min A 17-21 days  -fitted with smaller [place] J collar which fits much better.    8/24- d/c date 9/2- con't PT and OT- CIR 2.  Antithrombotics: -DVT/anticoagulation: Subcutaneous heparin.  Check vascular study Mechanical: Sequential compression devices, below knee Bilateral lower extremities Pharmaceutical: sq hep             -antiplatelet therapy: N/A 3. Pain Management: Oxycodone as needed              Monitor with increased exertion  -dc'ed robaxin d/t sedation.   -continue tizanidine trial  8/23- will increase Oxycodone (get rid of percocet to Oxy '10mg'$ ) to q4 hours prn to help keep pain more stable. Trigger point injections today  8/24- pain slightly better with increase in oxy to q4 hours prn and trigger point injections yesterday 4. Mood: Zoloft 50 mg daily, Lamictal 200 mg twice daily             -antipsychotic agents: Abilify 10 mg daily   8/23- mood OK per pt- con't regimen for now 5. Neuropsych: This patient is?  Fully capable of making decisions on her own behalf. 6. Skin/Wound Care: Routine skin checks 7. Fluids/Electrolytes/Nutrition: Routine in and outs             CMP reviewed. Add protein supp for low albumin 8.  Hypertension.  Hydralazine 25 mg twice daily, Avapro 300 mg daily, Norvasc 10 mg daily              borderline. Tizanidine helping too Vitals:   09/09/20 0753 09/09/20 1422  BP: 135/62 123/64  Pulse: 82 78  Resp: 17 20  Temp: 98.6 F (37 C)   SpO2: 96% 96%    8/24- BP looks much better- con't regimen 9.  COPD/history of tobacco use.  Continue inhalers as directed.  Monitor oxygen  saturations  -O2 [place] as needed  8/24- meets criteria per walk test to continue home O2 10.  GERD.  Protonix 11.  CAD with history of stenting.  Plan to discuss with neurosurgery when to resume low-dose aspirin 12.  Diastolic congestive heart failure.  Lasix 20 mg daily.  Monitor for any signs of fluid overload   Filed Weights   09/07/20 0427 09/08/20 0352 09/09/20 0437  Weight: 80.3 kg 82.9 kg 79 kg    -8/24- weight up x1 day, then back down- con't to monitor   -monitor daily 13. Neurogenic bowel: still no bm yesterday despite sorbitol and sse    8/19 mg citrate on backorder -repeat sorbitol and SSE later today if needed -added linzess 14. Neurogenic bladder:  -continue voiding trial  -I/O cath prn to keep volumes between 300-500cc, patient is not emptying completely  8/24- will add Flomax to help pt void better/more completely.    Patient got for trigger point injections for myofascial tightness.  Consent done and on chart.  Cleaned areas with alcohol and injected using a 27 gauge 1.5 inch needle  Injected  Using 1% Lidocaine with no EPI  Upper traps R only Levators- couldn't do due to incision/dressing.  Posterior scalenes B/L    Pt reports being a little looser afterwards, but pain around the same.     LOS: 7 days A FACE TO FACE EVALUATION WAS PERFORMED  Yair Dusza 09/09/2020, 7:37 PM

## 2020-09-10 DIAGNOSIS — G8918 Other acute postprocedural pain: Secondary | ICD-10-CM

## 2020-09-10 MED ORDER — OXYCODONE HCL ER 10 MG PO T12A
10.0000 mg | EXTENDED_RELEASE_TABLET | Freq: Two times a day (BID) | ORAL | Status: DC
  Administered 2020-09-10 – 2020-09-18 (×16): 10 mg via ORAL
  Filled 2020-09-10 (×16): qty 1

## 2020-09-10 MED ORDER — ENOXAPARIN SODIUM 40 MG/0.4ML IJ SOSY
40.0000 mg | PREFILLED_SYRINGE | Freq: Every day | INTRAMUSCULAR | Status: DC
  Administered 2020-09-10 – 2020-09-17 (×8): 40 mg via SUBCUTANEOUS
  Filled 2020-09-10 (×9): qty 0.4

## 2020-09-10 NOTE — Progress Notes (Signed)
Physical Therapy Session Note  Patient Details  Name: [name] MRN: [id number] Date of Birth: [birth year]  Today's Date: 09/10/2020 PT Individual Time: 1115-1200 and 1315-1347 PT Individual Time Calculation (min): 45 min and 32 min  Short Term Goals: Week 1:  PT Short Term Goal 1 (Week 1): Pt will perform bed mobility consistently with modA. PT Short Term Goal 2 (Week 1): Pt will peform bed to chair transfer with minA. PT Short Term Goal 3 (Week 1): Pt will ambulate x50' with modA +1 and LRAD.  Skilled Therapeutic Interventions/Progress Updates: Tx1: Pt presented in recliner agreeable to therapy. Pt states pain 9/10 at rest in semi reclined position. Pt performed ambulatory transfer to w/c form recliner with RW and CGA. Pt transported to dayroom for time management and energy conservation. Pt participated in dynamic balance activities in [place] starting with static stand with gentle permutations then progressing to reaching alternating UE and attempts at standing without UE support. Pt required intermittent breaks due to fatigue. Pt then participated in ambulation for endurance 190ft x 2 with RW and CGA. SpO2 95% and HR 123 after activity. Performed toe taps to 6in step x 10 bilaterally in preparation for steps. Pt then transported back to room and ambulated from room door to recliner. Pt encouraged to sit in recliner for improved positioning to eat lunch. Pt left in recliner at end of session with seat alarm on, call bell within reach and needs met.   Tx2: Pt presented in recliner agreeable to therapy. Pt states pain 10/10 but meds not avail until end of session. Performed ambulatory transfer with CGA to w/c and transported to rehab gym. Pt instructed in and performed block practice of curb steps onto 5in curb. Pt was able to perform x 4 times total with consistent CGA. Pt was able to manage RW on/off curb without assist. Pt then ambulated back to room with x 3 seated rest breaks with distances  of 130ft, 63ft, and 44ft. Pt sat at EOB and requested BSC due to urinary urgency. Performed stand pivot with RW and close supervision with CGA to lower shorts/brief (+urinary void). Pt did require minA to pull pants/brief over hips. Pt returned to EOB and performed sit to long sit at EOB with supervision. PTA raised HOB to comfort and pt was able to reposition as needed. Pt left in bed at end of session with bed alarm on, call bell within reach and needs met.      Therapy Documentation Precautions:  Precautions Precautions: Cervical, Fall Precaution Comments: watch O2 sats Required Braces or Orthoses: Cervical Brace Cervical Brace: Hard collar, At all times Restrictions Weight Bearing Restrictions: No General: PT Amount of Missed Time (min): 15 Minutes PT Missed Treatment Reason: Other (Comment)    Therapy/Group: Individual Therapy  Tanetta Fuhriman 09/10/2020, 12:21 PM

## 2020-09-10 NOTE — Progress Notes (Signed)
Patient slept fairly well. PRN pain medications all throughout the night per request for back/neck pain. Pain rate 9/10 and still 9/10 upon reassessment. However pt reports that if given prn pain meds at scheduled times her pain is "better". Pt did voided x2 this shift, with no in and out cath required.

## 2020-09-10 NOTE — Progress Notes (Signed)
PROGRESS NOTE   Subjective/Complaints:  Pt reports pain is bad again this AM- 9-10/10- asking if there's anything else that can be done- has difficulties with morphine- hallucinations and confusion added to allergy list- just morphine.   Neck pain is "horrible" and "awful"- LBM yesterday- pain is aching and thorbbing, not nerve pain.  No caths since yesterday after got first dose of Flomax- not sure if incomplete emptying this AM or not.     ROS:   Pt denies SOB, abd pain, CP, N/V/C/D, and vision changes   Objective:   No results found. No results for input(s): WBC, HGB, HCT, PLT in the last 72 hours.  No results for input(s): NA, K, CL, CO2, GLUCOSE, BUN, CREATININE, CALCIUM in the last 72 hours.   Intake/Output Summary (Last 24 hours) at 09/10/2020 1105 Last data filed at 09/10/2020 0835 Gross per 24 hour  Intake 954 ml  Output 1305 ml  Net -351 ml        Physical Exam: Vital Signs Blood pressure (!) 150/62, pulse 62, temperature 97.6 F (36.4 C), resp. rate 20, height '5\' 3"'$  (1.6 m), weight 79.6 kg, SpO2 96 %.       General: awake, alert, appropriate,sitting up in bedside chair; therapy in room; wearing O2 by [place]; 2L;  NAD HENT: conjugate gaze; oropharynx moist; cervical collar in place;  CV: regular rate; no JVD Pulmonary: CTA B/L; no W/R/R- good air movement GI: soft, NT, ND, (+)BS Psychiatric: appropriate Neurological: Ox3  Skin: cervical wound cdi with dry dressing. - . A few scattered lacs and abrasions on legs/arms, bruises remain.  Neuro:  fairly alert, oriented. Follows commands. Able to carry conversation. No CN findings. RUE 4+/5. LUE 4, decreased sensation to LT/pain LUE>LUE. LE4- prox to 4/5 distally. No sensory findings in legs. DTR's 3+ Musculoskeletal: cervicalgia ongoing in collar.  Tight trigger points in neck/shoulders/upper back - still very tight- and cries out with palpation of neck.     Assessment/Plan: 1. Functional deficits which require 3+ hours per day of interdisciplinary therapy in a comprehensive inpatient rehab setting. Physiatrist is providing close team supervision and 24 hour management of active medical problems listed below. Physiatrist and rehab team continue to assess barriers to discharge/monitor patient progress toward functional and medical goals  Care Tool:  Bathing        Body parts bathed by helper: Right arm, Left arm, Chest, Abdomen, Front perineal area, Buttocks, Right upper leg, Left upper leg, Right lower leg, Left lower leg, Face     Bathing assist Assist Level: Dependent - Patient 0%     Upper Body Dressing/Undressing Upper body dressing   What is the patient wearing?: Pull over shirt    Upper body assist Assist Level: Moderate Assistance - Patient 50 - 74%    Lower Body Dressing/Undressing Lower body dressing      What is the patient wearing?: Underwear/pull up, Pants     Lower body assist Assist for lower body dressing: Minimal Assistance - Patient > 75%     Toileting Toileting    Toileting assist Assist for toileting: Moderate Assistance - Patient 50 - 74%     Transfers Chair/bed transfer  Transfers assist     Chair/bed transfer assist level: Contact Guard/Touching assist     Locomotion Ambulation   Ambulation assist      Assist level: Contact Guard/Touching assist Assistive device: Walker-rolling Max distance: 13f   Walk 10 feet activity   Assist     Assist level: Contact Guard/Touching assist Assistive device: Walker-rolling   Walk 50 feet activity   Assist Walk 50 feet with 2 turns activity did not occur: Safety/medical concerns  Assist level: Contact Guard/Touching assist Assistive device: Walker-rolling    Walk 150 feet activity   Assist Walk 150 feet activity did not occur: Safety/medical concerns         Walk 10 feet on uneven surface  activity   Assist Walk 10  feet on uneven surfaces activity did not occur: Safety/medical concerns   Assist level: Contact Guard/Touching assist Assistive device: Walker-rolling   Wheelchair     Assist Is the patient using a wheelchair?: No             Wheelchair 50 feet with 2 turns activity    Assist            Wheelchair 150 feet activity     Assist          Blood pressure (!) 150/62, pulse 62, temperature 97.6 F (36.4 C), resp. rate 20, height '5\' 3"'$  (1.6 m), weight 79.6 kg, SpO2 96 %.     Medical Problem List and Plan: 1.  Gait abnormality secondary to severe spondylitic myelopathy with severe stenosis C2-3 3-4/central cord syndrome.  Status post cervical arthrodesis with posterior cervical bilateral laminectomy 08/28/2020.  Cervical collar as directed             -patient may shower             -ELOS/Goals: Supervision/Min A 17-21 days  -fitted with smaller [place] J collar which fits much better.    8/24- d/c date 9/2- con't PT and OT- CIR  -con't PT and OT- con't cervical collar 2.  Antithrombotics: -DVT/anticoagulation: Subcutaneous heparin.  Check vascular study Mechanical: Sequential compression devices, below knee Bilateral lower extremities Pharmaceutical: sq hep  8/25- change to Lovneox since Can take/Cr OK             -antiplatelet therapy: N/A 3. Pain Management: Oxycodone as needed              Monitor with increased exertion  -dc'ed robaxin d/t sedation.   -continue tizanidine trial  8/23- will increase Oxycodone (get rid of percocet to Oxy '10mg'$ ) to q4 hours prn to help keep pain more stable. Trigger point injections today  8/24- pain slightly better with increase in oxy to q4 hours prn and trigger point injections yesterday  8/25- will add Oxycontin 10 mg BID since having so much pain- seems miserable, even with Oxycodone- cannot take Morphine- causes Hallucinations.  4. Mood: Zoloft 50 mg daily, Lamictal 200 mg twice daily             -antipsychotic agents:  Abilify 10 mg daily   8/23- mood OK per pt- con't regimen for now 5. Neuropsych: This patient is?  Fully capable of making decisions on her own behalf. 6. Skin/Wound Care: Routine skin checks 7. Fluids/Electrolytes/Nutrition: Routine in and outs             CMP reviewed. Add protein supp for low albumin 8.  Hypertension.  Hydralazine 25 mg twice daily, Avapro 300 mg daily, Norvasc 10 mg daily  borderline. Tizanidine helping too Vitals:   09/10/20 0510 09/10/20 0804  BP: (!) 150/62   Pulse: 62   Resp: 20   Temp: 97.6 F (36.4 C)   SpO2: 93% 96%    8/24- BP looks much better- con't regimen 9.  COPD/history of tobacco use.  Continue inhalers as directed.  Monitor oxygen saturations  -O2 [place] as needed  8/24- meets criteria per walk test to continue home O2 10.  GERD.  Protonix 11.  CAD with history of stenting.  Plan to discuss with neurosurgery when to resume low-dose aspirin 12.  Diastolic congestive heart failure.  Lasix 20 mg daily.  Monitor for any signs of fluid overload   Filed Weights   09/08/20 0352 09/09/20 0437 09/10/20 0510  Weight: 82.9 kg 79 kg 79.6 kg    -8/24- weight up x1 day, then back down- con't to monitor   -monitor daily 13. Neurogenic bowel: still no bm yesterday despite sorbitol and sse    8/19 mg citrate on backorder -repeat sorbitol and SSE later today if needed -added linzess 14. Neurogenic bladder:  -continue voiding trial  -I/O cath prn to keep volumes between 300-500cc, patient is not emptying completely  8/24- will add Flomax to help pt void better/more completely.   8/25- now voiding more- will con't flomax     LOS: 8 days A FACE TO FACE EVALUATION WAS PERFORMED  Jelani Vreeland 09/10/2020, 11:05 AM

## 2020-09-10 NOTE — Progress Notes (Signed)
Physical Therapy Weekly Progress Note  Patient Details  Name: [name] MRN: [id number] Date of Birth: 29-Jul-1945  Beginning of progress report period: September 03, 2020 End of progress report period: September 10, 2020  Today's Date: 09/10/2020   Patient has met 3 of 3 short term goals.  Pt has made significant improvements during current course of therapy. Pt continues to have significant pain however demonstrates improvement in overall functional mobility. Pt is currently CGA nearing close supervision for transfers, CGA for gait with increasing distances, and CGA for curb steps. Pt continues to demonstrate limitations in balance and endurance.   Patient continues to demonstrate the following deficits muscle weakness and muscle joint tightness and decreased cardiorespiratoy endurance and decreased oxygen support and therefore will continue to benefit from skilled PT intervention to increase functional independence with mobility.  Patient progressing toward long term goals..  Continue plan of care.  PT Short Term Goals Week 1:  PT Short Term Goal 1 (Week 1): Pt will perform bed mobility consistently with modA. PT Short Term Goal 1 - Progress (Week 1): Met PT Short Term Goal 2 (Week 1): Pt will peform bed to chair transfer with minA. PT Short Term Goal 2 - Progress (Week 1): Met PT Short Term Goal 3 (Week 1): Pt will ambulate x50' with modA +1 and LRAD. PT Short Term Goal 3 - Progress (Week 1): Met Week 2:  PT Short Term Goal 1 (Week 2): STG = LTG due to LOS   Ambulation/gait training;Community reintegration;DME/adaptive equipment instruction;Neuromuscular re-education;Psychosocial support;Stair training;UE/LE Strength taining/ROM;Wheelchair propulsion/positioning;Balance/vestibular training;Discharge planning;Functional electrical stimulation;Pain management;Skin care/wound management;Therapeutic Activities;UE/LE Coordination activities;Cognitive remediation/compensation;Disease  management/prevention;Functional mobility training;Patient/family education;Splinting/orthotics;Therapeutic Exercise;Visual/perceptual remediation/compensation   Therapy Documentation Precautions:  Precautions Precautions: Cervical, Fall Precaution Comments: watch O2 sats Required Braces or Orthoses: Cervical Brace Cervical Brace: Hard collar, At all times Restrictions Weight Bearing Restrictions: No Vital Signs: Therapy Vitals Temp: 98 F (36.7 C) Temp Source: Oral Pulse Rate: 76 Resp: 18 BP: (!) 130/54 Patient Position (if appropriate): Lying Oxygen Therapy SpO2: 94 % O2 Device: Nasal Cannula O2 Flow Rate (L/min): 2 L/min  Therapy/Group: Individual Therapy  [name] [name] [name], PT, DPT 09/10/2020, 4:30 PM

## 2020-09-10 NOTE — Progress Notes (Signed)
Occupational Therapy Session Note  Patient Details  Name: [name] MRN: [id number] Date of Birth: Jan 09, 1946  Today's Date: 09/10/2020 OT Individual Time: [id number] Individual Time Calculation (min): 70 min    Short Term Goals: Week 2:  OT Short Term Goal 1 (Week 2): STG=LTG 2/2 ELOS (continue towards supervision/min A goals)  Skilled Therapeutic Interventions/Progress Updates:    OT intervention with focus on bed mobility, sitting balance, sit<>stand, standing balance, functional transers, toileting, dressing, and activity tolerance to increase indepdnence with BADLs. Supine>sit EOB with supervision. Sit<>stand with CGA. LB dressing with reacher min A. UB dressing with mod A. BSC tranfsers with CGA. Toileting with max A. Pt required assistance with clothing management. Pt requires rest breaks during ADLs. Pt pleased with progress. Pt remained seated in recliner with all needs within reach and seat alarm activated.   Therapy Documentation Precautions:  Precautions Precautions: Cervical, Fall Precaution Comments: watch O2 sats Required Braces or Orthoses: Cervical Brace Cervical Brace: Hard collar, At all times Restrictions Weight Bearing Restrictions: No Pain: Pt c/o 9/10 neck pain; Cervical collar readjusted and pain meds admin by RN  Therapy/Group: Individual Therapy  Leroy Libman 09/10/2020, 9:28 AM

## 2020-09-11 NOTE — Progress Notes (Signed)
Occupational Therapy Session Note  Patient Details  Name: KJERSTEN ORMISTON MRN: [id number] Date of Birth: 10-16-1945  Today's Date: 09/11/2020 OT Individual Time: 0815-0900 OT Individual Time Calculation (min): 45 min    Short Term Goals: Week 1:  OT Short Term Goal 1 (Week 1): patient will complete bed mobility and SPT with mod A OT Short Term Goal 1 - Progress (Week 1): Met OT Short Term Goal 2 (Week 1): patient will complete eating, grooming and upper body adl with mod A using AD/built up handles OT Short Term Goal 2 - Progress (Week 1): Met OT Short Term Goal 3 (Week 1): patient will tolerate unsupported sitting with min A OT Short Term Goal 3 - Progress (Week 1): Met OT Short Term Goal 4 (Week 1): patient will complete toileting max A of one OT Short Term Goal 4 - Progress (Week 1): Met Week 2:  OT Short Term Goal 1 (Week 2): STG=LTG 2/2 ELOS (continue towards supervision/min A goals)  Skilled Therapeutic Interventions/Progress Updates:   Patient seen for OT this am.  Patient reports she has already bathed and dressed, and is planning to shower first of next week.  Patient assisted to wheelchair with contact guard assist, HOB elevated to ~30*.  Patient reports slight improvement in pain during the night - moment when pain down to 7 frmm 8/10.  Patient agreeable to shampoo cap - cleaned some dried blood from hair.   Down to rehab bathroom for dry run shower transfer with tub transfer bench.  Demo'd shower transfer for patient, explaining step by step. Patient able to complete with increased time and close supervision.  Patient indicates she is ordering a tub transfer bench, and is familiar from prior rehab experience.    Therapy Documentation Precautions:  Precautions Precautions: Cervical, Fall Precaution Comments: watch O2 sats Required Braces or Orthoses: Cervical Brace Cervical Brace: Hard collar, At all times Restrictions Weight Bearing Restrictions: No General:   Vital  Signs: 1L O2 via [place]  Pain: Pain Assessment Pain Score: 9  Pain Type: Acute pain Pain Location: Neck Pain Orientation: Posterior Pain Descriptors / Indicators: Aching Pain Onset: On-going ADL:  Therapy/Group: Individual Therapy  Mariah Milling 09/11/2020, 10:21 AM

## 2020-09-11 NOTE — Progress Notes (Signed)
Occupational Therapy Session Note  Patient Details  Name: [name] MRN: [id number] Date of Birth: 1945-12-29  Today's Date: 09/11/2020 OT Individual Time: 1040-1105 OT Individual Time Calculation (min): 25 min    Short Term Goals: Week 2:  OT Short Term Goal 1 (Week 2): STG=LTG 2/2 ELOS (continue towards supervision/min A goals)  Skilled Therapeutic Interventions/Progress Updates:    Pt received sitting in the recliner with moderate pain (unrated) in her neck but agreeable to OT session. She requested OT to adjust [place] J brace- did so but still with poor fit ,likely d/t pt baseline cervical restrictions/posturing. Pt on 1L O2 via [place]. While seated her SpO2 was 87-88%, upon standing and transferring to w/c it rose to 94% on just 1L and she was able to maintain sats the rest of the session on just 1L. Pt completed 100 ft of functional mobility with the RW with CGA. In the therapy gym she completed functional reaching to facilitate chest opening, more upright posture, and more neutral cervical alignment. Pt returned to her room and was left supine with all needs met. Bed alarm set.   Therapy Documentation Precautions:  Precautions Precautions: Cervical, Fall Precaution Comments: watch O2 sats Required Braces or Orthoses: Cervical Brace Cervical Brace: Hard collar, At all times Restrictions Weight Bearing Restrictions: No  Therapy/Group: Individual Therapy  Curtis Sites 09/11/2020, 6:12 AM

## 2020-09-11 NOTE — Progress Notes (Signed)
Physical Therapy Session Note  Patient Details  Name: [name] MRN: [id number] Date of Birth: October 14, 1945  Today's Date: 09/11/2020 PT Individual Time: 0920-1015 and 1420-1520 PT Individual Time Calculation (min): 55 min and 60 min  Short Term Goals: Week 2:  PT Short Term Goal 1 (Week 2): STG = LTG due to LOS  Skilled Therapeutic Interventions/Progress Updates: Tx1: Pt presented in recliner agreeable to therapy. Pt states pain 9/10 and aware pain meds scheduled for end of session. Pt transported to day room and participated in ambulation 131ft x2 with RW and CGA with PTA managing O2 tank. Pt agreeable to do some ambulation outside, PTA obtained shoes and donned total A for time management. Pt transported to [facility] entrance and participated in several ambulation bouts on uneven surfaces with pt demonstrating good safety with RW and PTA asissting with O2 tank management. PTA and pt discussed mobility outside home with pt expressing that prior to hospitalization she did not leave home very often. Sates she has portable O2 tank and has taken it out on occasion. Pt also participated in seated therex including ankle pumps, LAQ, hip flexion, hip abd/add, and manually resisited hamstring curls 2 x 10. Pt transported back to room at end of session and performed ambulatory transfer to recliner with close S. Pt left in recliner at end of session with seat alarm on, call bell within reach and needs met.     Tx2: Pt presented in bed agreeable to therapy. Pt states pain 10/10, pain meds received during session and rest breaks provided as needed. Performed supine to sit with supervision and use of bed features to EOB. Pt c/o increased burning on buttock and as nsg present inspected wounds on buttocks. Pt was able to maintain standing with supervision as nsg performed skin check/measurements and applied foam dressing. Pt transported to day room and participated in Cybex Kinetron at 70cm/sec for BLE strengthening  and general conditioning for two 3 minute bouts. Pt then participated in ambulating around day room picking up towels with reaching performing task with overall CGA and demonstrating good safety with RW and approaching towels closely to pick up objects. After seated rest pt then assisted PTA in folding towels for static balance performing with supervision. Pt indicated increased pain level and requested going back to room due to significant pain. Pt transported back to room and performed ambulatory transfer to bed. Pt was able to transfer EOB to long sit with supervision and PTA raised HOB vs pt laying to supine. Pt repositioned to comfort and left in bed with bed alarm on, call bell within reach and needs met.      Therapy Documentation Precautions:  Precautions Precautions: Cervical, Fall Precaution Comments: watch O2 sats Required Braces or Orthoses: Cervical Brace Cervical Brace: Hard collar, At all times Restrictions Weight Bearing Restrictions: No General:   Vital Signs: Therapy Vitals Temp: 97.7 F (36.5 C) Temp Source: Oral Pulse Rate: 73 Resp: 20 BP: (!) 120/59 Patient Position (if appropriate): Lying Oxygen Therapy SpO2: 94 % O2 Device: Room Air Pain:   Mobility:   Locomotion :    Trunk/Postural Assessment :    Balance:   Exercises:   Other Treatments:      Therapy/Group: Individual Therapy  Quamesha Mullet 09/11/2020, 4:06 PM

## 2020-09-12 DIAGNOSIS — L8990 Pressure ulcer of unspecified site, unspecified stage: Secondary | ICD-10-CM | POA: Insufficient documentation

## 2020-09-12 MED ORDER — SORBITOL 70 % SOLN
30.0000 mL | Freq: Once | Status: AC
  Administered 2020-09-12: 30 mL via ORAL
  Filled 2020-09-12: qty 30

## 2020-09-12 NOTE — Progress Notes (Signed)
PROGRESS NOTE   Subjective/Complaints:  Pt reports pain is ~ 7/10- and feels this is tolerable for her-  Feeling MUCH better with addition of Oxycontin- also discussed that needs to ask for Oxycodone before 7am/7pm- she will not receive the Oxycodone within 1 hr of Oxycontin dose.   Back hurting a little this AM- LBM 2 days ago- feels like can go today.    ROS:   Pt denies SOB, abd pain, CP, N/V/C/D, and vision changes   Objective:   No results found. No results for input(s): WBC, HGB, HCT, PLT in the last 72 hours.  No results for input(s): NA, K, CL, CO2, GLUCOSE, BUN, CREATININE, CALCIUM in the last 72 hours.   Intake/Output Summary (Last 24 hours) at 09/12/2020 1356 Last data filed at 09/12/2020 1255 Gross per 24 hour  Intake 700 ml  Output --  Net 700 ml     Pressure Injury 09/11/20 Buttocks Left Stage 2 -  Partial thickness loss of dermis presenting as a shallow open injury with a red, pink wound bed without slough. non-blanchable area with very thin dry skin flakes covering the center (Active)  09/11/20 1441  Location: Buttocks  Location Orientation: Left  Staging: Stage 2 -  Partial thickness loss of dermis presenting as a shallow open injury with a red, pink wound bed without slough.  Wound Description (Comments): non-blanchable area with very thin dry skin flakes covering the center  Present on Admission:      Pressure Injury 09/11/20 Buttocks Right Stage 2 -  Partial thickness loss of dermis presenting as a shallow open injury with a red, pink wound bed without slough. non-blanchable area with thin layer skin flakes covering the center (Active)  09/11/20 1445  Location: Buttocks  Location Orientation: Right  Staging: Stage 2 -  Partial thickness loss of dermis presenting as a shallow open injury with a red, pink wound bed without slough.  Wound Description (Comments): non-blanchable area with thin layer  skin flakes covering the center  Present on Admission:     Physical Exam: Vital Signs Blood pressure (!) 86/51, pulse 63, temperature 98.2 F (36.8 C), resp. rate 18, height '5\' 3"'$  (1.6 m), weight 80.3 kg, SpO2 93 %.        General: awake, alert, appropriate, sitting up in bed; wearing home O2 by [place]- 2L; brighter affect; NAD HENT: conjugate gaze; oropharynx moist; cervical collar in place CV: regular rate; no JVD Pulmonary: CTA B/L; no W/R/R- good air movement GI: soft, NT, ND, (+)BS Psychiatric: appropriate; brighter; ate all breakfast Neurological: alert   Skin: cervical wound cdi with dry dressing. - . A few scattered lacs and abrasions on legs/arms, bruises remain.  Neuro:  fairly alert, oriented. Follows commands. Able to carry conversation. No CN findings. RUE 4+/5. LUE 4, decreased sensation to LT/pain LUE>LUE. LE4- prox to 4/5 distally. No sensory findings in legs. DTR's 3+ Musculoskeletal: cervicalgia ongoing in collar.  Tight trigger points in neck/shoulders/upper back - still very tight- and cries out with palpation of neck.    Assessment/Plan: 1. Functional deficits which require 3+ hours per day of interdisciplinary therapy in a comprehensive inpatient rehab setting. Physiatrist is providing  close team supervision and 24 hour management of active medical problems listed below. Physiatrist and rehab team continue to assess barriers to discharge/monitor patient progress toward functional and medical goals  Care Tool:  Bathing        Body parts bathed by helper: Right arm, Left arm, Chest, Abdomen, Front perineal area, Buttocks, Right upper leg, Left upper leg, Right lower leg, Left lower leg, Face     Bathing assist Assist Level: Dependent - Patient 0%     Upper Body Dressing/Undressing Upper body dressing   What is the patient wearing?: Pull over shirt    Upper body assist Assist Level: Moderate Assistance - Patient 50 - 74%    Lower Body  Dressing/Undressing Lower body dressing      What is the patient wearing?: Underwear/pull up, Pants     Lower body assist Assist for lower body dressing: Minimal Assistance - Patient > 75%     Toileting Toileting    Toileting assist Assist for toileting: Moderate Assistance - Patient 50 - 74%     Transfers Chair/bed transfer  Transfers assist     Chair/bed transfer assist level: Contact Guard/Touching assist     Locomotion Ambulation   Ambulation assist      Assist level: Contact Guard/Touching assist Assistive device: Walker-rolling Max distance: 33f   Walk 10 feet activity   Assist     Assist level: Contact Guard/Touching assist Assistive device: Walker-rolling   Walk 50 feet activity   Assist Walk 50 feet with 2 turns activity did not occur: Safety/medical concerns  Assist level: Contact Guard/Touching assist Assistive device: Walker-rolling    Walk 150 feet activity   Assist Walk 150 feet activity did not occur: Safety/medical concerns         Walk 10 feet on uneven surface  activity   Assist Walk 10 feet on uneven surfaces activity did not occur: Safety/medical concerns   Assist level: Contact Guard/Touching assist Assistive device: Walker-rolling   Wheelchair     Assist Is the patient using a wheelchair?: No             Wheelchair 50 feet with 2 turns activity    Assist            Wheelchair 150 feet activity     Assist          Blood pressure (!) 86/51, pulse 63, temperature 98.2 F (36.8 C), resp. rate 18, height '5\' 3"'$  (1.6 m), weight 80.3 kg, SpO2 93 %.     Medical Problem List and Plan: 1.  Gait abnormality secondary to severe spondylitic myelopathy with severe stenosis C2-3 3-4/central cord syndrome.  Status post cervical arthrodesis with posterior cervical bilateral laminectomy 08/28/2020.  Cervical collar as directed             -patient may shower             -ELOS/Goals: Supervision/Min  A 17-21 days  -fitted with smaller [place] J collar which fits much better.    8/24- d/c date 9/2- con't PT and OT- CIR  Con't PT and OT- CIR- con't cervical collar 2.  Antithrombotics: -DVT/anticoagulation: Subcutaneous heparin.  Check vascular study Mechanical: Sequential compression devices, below knee Bilateral lower extremities Pharmaceutical: sq hep  8/25- change to Lovneox since Can take/Cr OK  8/27- tolerating better             -antiplatelet therapy: N/A 3. Pain Management: Oxycodone as needed  Monitor with increased exertion  -dc'ed robaxin d/t sedation.   -continue tizanidine trial  8/23- will increase Oxycodone (get rid of percocet to Oxy '10mg'$ ) to q4 hours prn to help keep pain more stable. Trigger point injections today  8/24- pain slightly better with increase in oxy to q4 hours prn and trigger point injections yesterday  8/25- will add Oxycontin 10 mg BID since having so much pain- seems miserable, even with Oxycodone- cannot take Morphine- causes Hallucinations. 8/27- tolerating OxyContin well and pain down to "7/10"  4. Mood: Zoloft 50 mg daily, Lamictal 200 mg twice daily             -antipsychotic agents: Abilify 10 mg daily   8/23- mood OK per pt- con't regimen for now 5. Neuropsych: This patient is?  Fully capable of making decisions on her own behalf. 6. Skin/Wound Care: Routine skin checks 7. Fluids/Electrolytes/Nutrition: Routine in and outs             CMP reviewed. Add protein supp for low albumin 8.  Hypertension.  Hydralazine 25 mg twice daily, Avapro 300 mg daily, Norvasc 10 mg daily             borderline. Tizanidine helping too Vitals:   09/12/20 0426 09/12/20 1350  BP: (!) 146/76 (!) 86/51  Pulse: 67 63  Resp: 14 18  Temp: 97.7 F (36.5 C) 98.2 F (36.8 C)  SpO2: 91% 93%    8/27- BP slightly elevated, but then low this AM- somewhat labile- if continues, might update regimen- will monitor for trend.  9.  COPD/history of tobacco use.   Continue inhalers as directed.  Monitor oxygen saturations  -O2 [place] as needed  8/24- meets criteria per walk test to continue home O2  8/27- on 2L O2 at home- will continue 10.  GERD.  Protonix 11.  CAD with history of stenting.  Plan to discuss with neurosurgery when to resume low-dose aspirin 12.  Diastolic congestive heart failure.  Lasix 20 mg daily.  Monitor for any signs of fluid overload   Filed Weights   09/10/20 0510 09/11/20 0500 09/12/20 0500  Weight: 79.6 kg 78.5 kg 80.3 kg    -8/24- weight up x1 day, then back down- con't to monitor  8/27- weight keeps going up 1 day, then back down- bowels?   -monitor daily 13. Neurogenic bowel: still no bm yesterday despite sorbitol and sse    8/19 mg citrate on backorder -repeat sorbitol and SSE later today if needed -added linzess 14. Neurogenic bladder:  -continue voiding trial  -I/O cath prn to keep volumes between 300-500cc, patient is not emptying completely  8/24- will add Flomax to help pt void better/more completely.   8/25- now voiding more- will con't flomax  8/27- emptying with voiding- con't flomax     LOS: 10 days A FACE TO FACE EVALUATION WAS PERFORMED  [name] 09/12/2020, 1:56 PM

## 2020-09-13 MED ORDER — TIZANIDINE HCL 2 MG PO TABS
2.0000 mg | ORAL_TABLET | Freq: Once | ORAL | Status: AC
  Administered 2020-09-13: 2 mg via ORAL
  Filled 2020-09-13: qty 1

## 2020-09-13 MED ORDER — TIZANIDINE HCL 4 MG PO TABS
4.0000 mg | ORAL_TABLET | Freq: Four times a day (QID) | ORAL | Status: DC | PRN
  Administered 2020-09-13 – 2020-09-15 (×8): 4 mg via ORAL
  Filled 2020-09-13 (×8): qty 1

## 2020-09-13 NOTE — Progress Notes (Signed)
PROGRESS NOTE   Subjective/Complaints:  Pt reports feeling better this AM, but neck hurt "real bad" last night- I was called and changed her zanaflex to 4 mg q6 hours prn.  Can't figure out why hurt more, except no therapy yesterday- pooped 3x with sorbitol- still feels full, but having company and doesn't want more sorbitol today.    ROS:   Pt denies SOB, abd pain, CP, N/V/C/D, and vision changes   Objective:   No results found. No results for input(s): WBC, HGB, HCT, PLT in the last 72 hours.  No results for input(s): NA, K, CL, CO2, GLUCOSE, BUN, CREATININE, CALCIUM in the last 72 hours.   Intake/Output Summary (Last 24 hours) at 09/13/2020 1448 Last data filed at 09/13/2020 0809 Gross per 24 hour  Intake 477 ml  Output --  Net 477 ml     Pressure Injury 09/11/20 Buttocks Left Stage 2 -  Partial thickness loss of dermis presenting as a shallow open injury with a red, pink wound bed without slough. non-blanchable area with very thin dry skin flakes covering the center (Active)  09/11/20 1441  Location: Buttocks  Location Orientation: Left  Staging: Stage 2 -  Partial thickness loss of dermis presenting as a shallow open injury with a red, pink wound bed without slough.  Wound Description (Comments): non-blanchable area with very thin dry skin flakes covering the center  Present on Admission:      Pressure Injury 09/11/20 Buttocks Right Stage 2 -  Partial thickness loss of dermis presenting as a shallow open injury with a red, pink wound bed without slough. non-blanchable area with thin layer skin flakes covering the center (Active)  09/11/20 1445  Location: Buttocks  Location Orientation: Right  Staging: Stage 2 -  Partial thickness loss of dermis presenting as a shallow open injury with a red, pink wound bed without slough.  Wound Description (Comments): non-blanchable area with thin layer skin flakes covering the  center  Present on Admission:     Physical Exam: Vital Signs Blood pressure (!) 106/49, pulse 67, temperature 98.2 F (36.8 C), temperature source Oral, resp. rate 17, height '5\' 3"'$  (1.6 m), weight 78.9 kg, SpO2 95 %.          General: awake, alert, appropriate, sitting up slightly in bed; O2 by [place]_ home O2; NAD HENT: conjugate gaze; oropharynx moist; cervical collar in place CV: regular rate; no JVD Pulmonary: CTA B/L; no W/R/R- good air movement GI: soft, NT, ND, (+)BS; hypoactive; protuberant Psychiatric: appropriate Neurological: Ox3  Skin: cervical wound cdi with dry dressing. - . A few scattered lacs and abrasions on legs/arms, bruises remain.  Neuro:  fairly alert, oriented. Follows commands. Able to carry conversation. No CN findings. RUE 4+/5. LUE 4, decreased sensation to LT/pain LUE>LUE. LE4- prox to 4/5 distally. No sensory findings in legs. DTR's 3+ Musculoskeletal: cervicalgia ongoing in collar.  Tight trigger points in neck/shoulders/upper back - still very tight- and cries out with palpation of neck.    Assessment/Plan: 1. Functional deficits which require 3+ hours per day of interdisciplinary therapy in a comprehensive inpatient rehab setting. Physiatrist is providing close team supervision and 24 hour management  of active medical problems listed below. Physiatrist and rehab team continue to assess barriers to discharge/monitor patient progress toward functional and medical goals  Care Tool:  Bathing        Body parts bathed by helper: Right arm, Left arm, Chest, Abdomen, Front perineal area, Buttocks, Right upper leg, Left upper leg, Right lower leg, Left lower leg, Face     Bathing assist Assist Level: Dependent - Patient 0%     Upper Body Dressing/Undressing Upper body dressing   What is the patient wearing?: Pull over shirt    Upper body assist Assist Level: Moderate Assistance - Patient 50 - 74%    Lower Body Dressing/Undressing Lower body  dressing      What is the patient wearing?: Underwear/pull up, Pants     Lower body assist Assist for lower body dressing: Minimal Assistance - Patient > 75%     Toileting Toileting    Toileting assist Assist for toileting: Moderate Assistance - Patient 50 - 74%     Transfers Chair/bed transfer  Transfers assist     Chair/bed transfer assist level: Contact Guard/Touching assist     Locomotion Ambulation   Ambulation assist      Assist level: Contact Guard/Touching assist Assistive device: Walker-rolling Max distance: 180'   Walk 10 feet activity   Assist     Assist level: Contact Guard/Touching assist Assistive device: Walker-rolling   Walk 50 feet activity   Assist Walk 50 feet with 2 turns activity did not occur: Safety/medical concerns  Assist level: Contact Guard/Touching assist Assistive device: Walker-rolling    Walk 150 feet activity   Assist Walk 150 feet activity did not occur: Safety/medical concerns  Assist level: Contact Guard/Touching assist Assistive device: Walker-rolling    Walk 10 feet on uneven surface  activity   Assist Walk 10 feet on uneven surfaces activity did not occur: Safety/medical concerns   Assist level: Contact Guard/Touching assist Assistive device: Walker-rolling   Wheelchair     Assist Is the patient using a wheelchair?: No             Wheelchair 50 feet with 2 turns activity    Assist            Wheelchair 150 feet activity     Assist          Blood pressure (!) 106/49, pulse 67, temperature 98.2 F (36.8 C), temperature source Oral, resp. rate 17, height '5\' 3"'$  (1.6 m), weight 78.9 kg, SpO2 95 %.     Medical Problem List and Plan: 1.  Gait abnormality secondary to severe spondylitic myelopathy with severe stenosis C2-3 3-4/central cord syndrome.  Status post cervical arthrodesis with posterior cervical bilateral laminectomy 08/28/2020.  Cervical collar as directed              -patient may shower             -ELOS/Goals: Supervision/Min A 17-21 days  -fitted with smaller [place] J collar which fits much better.    8/24- d/c date 9/2- con't PT and OT- CIR  Con't PT and OT/CIR- and cervical collar 2.  Antithrombotics: -DVT/anticoagulation: Subcutaneous heparin.  Check vascular study Mechanical: Sequential compression devices, below knee Bilateral lower extremities Pharmaceutical: sq hep  8/25- change to Lovneox since Can take/Cr OK  8/27- tolerating better             -antiplatelet therapy: N/A 3. Pain Management: Oxycodone as needed  Monitor with increased exertion  -dc'ed robaxin d/t sedation.   -continue tizanidine trial  8/23- will increase Oxycodone (get rid of percocet to Oxy '10mg'$ ) to q4 hours prn to help keep pain more stable. Trigger point injections today  8/25- will add Oxycontin 10 mg BID since having so much pain- seems miserable, even with Oxycodone- cannot take Morphine- causes Hallucinations. 8/27- tolerating OxyContin well and pain down to "7/10"   8/28- had a bad night- increased zanaflex to 4 mg q6 hours prn- no sedation.  4. Mood: Zoloft 50 mg daily, Lamictal 200 mg twice daily             -antipsychotic agents: Abilify 10 mg daily   8/23- mood OK per pt- con't regimen for now 5. Neuropsych: This patient is?  Fully capable of making decisions on her own behalf. 6. Skin/Wound Care: Routine skin checks 7. Fluids/Electrolytes/Nutrition: Routine in and outs             CMP reviewed. Add protein supp for low albumin 8.  Hypertension.  Hydralazine 25 mg twice daily, Avapro 300 mg daily, Norvasc 10 mg daily             borderline. Tizanidine helping too Vitals:   09/13/20 0343 09/13/20 1338  BP: (!) 103/57 (!) 106/49  Pulse: (!) 55 67  Resp: 16 17  Temp: 97.7 F (36.5 C) 98.2 F (36.8 C)  SpO2: 95% 95%    8/27- BP slightly elevated, but then low this AM- somewhat labile- if continues, might update regimen- will monitor for  trend.   8/28- BP soft- but asymptomatic- likely Zanaflex doing it- con't regimen and monitor 9.  COPD/history of tobacco use.  Continue inhalers as directed.  Monitor oxygen saturations  -O2 [place] as needed  8/24- meets criteria per walk test to continue home O2  8/27- on 2L O2 at home- will continue 10.  GERD.  Protonix 11.  CAD with history of stenting.  Plan to discuss with neurosurgery when to resume low-dose aspirin 12.  Diastolic congestive heart failure.  Lasix 20 mg daily.  Monitor for any signs of fluid overload   Filed Weights   09/11/20 0500 09/12/20 0500 09/13/20 0500  Weight: 78.5 kg 80.3 kg 78.9 kg    -8/24- weight up x1 day, then back down- con't to monitor  8/28- weight back down- con't to monitor   -monitor daily 13. Neurogenic bowel: still no bm yesterday despite sorbitol and sse    8/19 mg citrate on backorder -repeat sorbitol and SSE later today if needed -added linzess 8/28- Sorbitol last night- 3 BM's- will wait on more sorbitol for now.  14. Neurogenic bladder:  -continue voiding trial  -I/O cath prn to keep volumes between 300-500cc, patient is not emptying completely  8/24- will add Flomax to help pt void better/more completely.   8/25- now voiding more- will con't flomax  8/27- emptying with voiding- con't flomax     LOS: 11 days A FACE TO FACE EVALUATION WAS PERFORMED  Charrie Mcconnon 09/13/2020, 2:48 PM

## 2020-09-13 NOTE — Progress Notes (Signed)
Physical Therapy Session Note  Patient Details  Name: [name] MRN: [id number] Date of Birth: January 13, 1946  Today's Date: 09/13/2020 PT Individual Time: 0900-1000 PT Individual Time Calculation (min): 60 min   Short Term Goals: Week 2:  PT Short Term Goal 1 (Week 2): STG = LTG due to LOS  Skilled Therapeutic Interventions/Progress Updates:    Pt received seated in bed, agreeable to PT session. Pt reports 8/10 pain in neck, declines intervention and reports this has been baseline since her injury. Pt requesting to change her shirt and have c-collar adjusted. Pt is min A for changing her shirt, dependent to adjust collar for comfort. Supine to sit with Supervision with HOB elevated. Sit to stand and stand pivot transfer with RW and CGA. Ambulation x 180 ft with RW and CGA for balance, onset of R knee/thigh pain and cramping with gait that does not resolve at rest. STM to R quad muscles, some improvement in pain symptoms. Pt on 2L O2 at rest, SpO2 97% at rest, SpO2 94% with activity. Nustep level 3 x 5 min with use of B UE/LE for global endurance training. Pt rates Nustep at level 13 on Borg scale. SpO2 99% following Nustep. Decreased supplemental O2 to 1L. Manual w/c propulsion 2 x 25 ft, 1 x 50 ft with use of BUE at Supervision level for global endurance training. SpO2 drops to 89% while propelling w/c on 1L O2, increased back to 2L O2. Pt returned to bed at end of session, Supervision for bed mobility. Pt left seated in bed with needs in reach, bed alarm in place, brother present.  Therapy Documentation Precautions:  Precautions Precautions: Cervical, Fall Precaution Comments: watch O2 sats Required Braces or Orthoses: Cervical Brace Cervical Brace: Hard collar, At all times Restrictions Weight Bearing Restrictions: No    Therapy/Group: Individual Therapy   Excell Seltzer, PT, DPT, CSRS  09/13/2020, 12:14 PM

## 2020-09-14 MED ORDER — AMLODIPINE BESYLATE 5 MG PO TABS
5.0000 mg | ORAL_TABLET | Freq: Every day | ORAL | Status: DC
  Administered 2020-09-15 – 2020-09-18 (×4): 5 mg via ORAL
  Filled 2020-09-14 (×4): qty 1

## 2020-09-14 MED ORDER — ATORVASTATIN CALCIUM 10 MG PO TABS
20.0000 mg | ORAL_TABLET | Freq: Every day | ORAL | Status: DC
  Administered 2020-09-15 – 2020-09-18 (×4): 20 mg via ORAL
  Filled 2020-09-14 (×4): qty 2

## 2020-09-14 NOTE — Progress Notes (Signed)
Occupational Therapy Session Note  Patient Details  Name: [name] MRN: [id number] Date of Birth: [birth year]  Today's Date: 09/14/2020 OT Individual Time: 5208-0223 OT Individual Time Calculation (min): 88 min    Short Term Goals: Week 1:  OT Short Term Goal 1 (Week 1): patient will complete bed mobility and SPT with mod A OT Short Term Goal 1 - Progress (Week 1): Met OT Short Term Goal 2 (Week 1): patient will complete eating, grooming and upper body adl with mod A using AD/built up handles OT Short Term Goal 2 - Progress (Week 1): Met OT Short Term Goal 3 (Week 1): patient will tolerate unsupported sitting with min A OT Short Term Goal 3 - Progress (Week 1): Met OT Short Term Goal 4 (Week 1): patient will complete toileting max A of one OT Short Term Goal 4 - Progress (Week 1): Met Week 2:  OT Short Term Goal 1 (Week 2): STG=LTG 2/2 ELOS (continue towards supervision/min A goals)   Skilled Therapeutic Interventions/Progress Updates:    Pt greeted at time of session semireclined in bed, emotional and saying in 10/10 pain and had not received meds yet. OT found RN and nursing aware, provided med pass partially through session. Pt determined to continue to push through discomfort and participate in OT session despite offering to defer shower to another date. Supine > sit Supervision and ambulated to bathroom and transferred to [facility] in shower same manner, all with RW. Therapist assist with doffing clothing d/t time and C-collar remained on throughout shower. UB/LB bathing Mod A overall for BLEs past knee level and buttocks prior to drying off same manner and walking to dry BSC over toilet to fully dry off and dress. Note on 2L of O2 throughout shower with pt dropping to 85% with activity but quickly rebounding to 94% or higher throughout rest of session. UB dress Mod A and LB dress Min A using reacher for underwear and shorts. Pt ambulating bathroom > wheelchair Supervision/CGA and  attempted to position comfortably. Needing to toilet at this time and requested BSC instead of walking to toilet, Supervision for transfer and Min A for clothing. Changed pads on C-collar as well with pt education on how to do so, planning to teach family members in the future as well. Therapist assisted with cleaning old C collar pads and laying out to dry. Pt still in pain, attempted to position in wheelchair with vairous pillows/towel rolls with no relief. Pt wanting to sit up until next therapy session and wait for pain meds to "kick in." Up in wheelchair alarm on call bell in reach.   Therapy Documentation Precautions:  Precautions Precautions: Cervical, Fall Precaution Comments: watch O2 sats Required Braces or Orthoses: Cervical Brace Cervical Brace: Hard collar, At all times Restrictions Weight Bearing Restrictions: No     Therapy/Group: Individual Therapy  Viona Gilmore 09/14/2020, 12:19 PM

## 2020-09-14 NOTE — Progress Notes (Signed)
PROGRESS NOTE   Subjective/Complaints: Feeling 10/10 pain in neck- pain meds do help- she is receiving now Wore O2 in the shower and sat dropped temporarily- she did not feel SOB. She does not have incentive spirometer   ROS:   Pt denies SOB, abd pain, CP, N/V/C/D, and vision changes, +10/10 neck pain   Objective:   No results found. No results for input(s): WBC, HGB, HCT, PLT in the last 72 hours.  No results for input(s): NA, K, CL, CO2, GLUCOSE, BUN, CREATININE, CALCIUM in the last 72 hours.   Intake/Output Summary (Last 24 hours) at 09/14/2020 1335 Last data filed at 09/14/2020 0700 Gross per 24 hour  Intake 118 ml  Output --  Net 118 ml     Pressure Injury 09/11/20 Buttocks Left Stage 2 -  Partial thickness loss of dermis presenting as a shallow open injury with a red, pink wound bed without slough. non-blanchable area with very thin dry skin flakes covering the center (Active)  09/11/20 1441  Location: Buttocks  Location Orientation: Left  Staging: Stage 2 -  Partial thickness loss of dermis presenting as a shallow open injury with a red, pink wound bed without slough.  Wound Description (Comments): non-blanchable area with very thin dry skin flakes covering the center  Present on Admission:      Pressure Injury 09/11/20 Buttocks Right Stage 2 -  Partial thickness loss of dermis presenting as a shallow open injury with a red, pink wound bed without slough. non-blanchable area with thin layer skin flakes covering the center (Active)  09/11/20 1445  Location: Buttocks  Location Orientation: Right  Staging: Stage 2 -  Partial thickness loss of dermis presenting as a shallow open injury with a red, pink wound bed without slough.  Wound Description (Comments): non-blanchable area with thin layer skin flakes covering the center  Present on Admission:     Physical Exam: Vital Signs Blood pressure (!) 93/44, pulse  63, temperature 98 F (36.7 C), resp. rate 16, height '5\' 3"'$  (1.6 m), weight 80.8 kg, SpO2 98 %. Gen: no distress, normal appearing HEENT: oral mucosa pink and moist, NCAT Cardio: Reg rate Chest: normal effort, normal rate of breathing Abd: soft, non-distended Ext: no edema Psych: pleasant, normal affect  Skin: cervical wound cdi with dry dressing. - . A few scattered lacs and abrasions on legs/arms, bruises remain.  Neuro:  fairly alert, oriented. Follows commands. Able to carry conversation. No CN findings. RUE 4+/5. LUE 4, decreased sensation to LT/pain LUE>LUE. LE4- prox to 4/5 distally. No sensory findings in legs. DTR's 3+ Musculoskeletal: cervicalgia ongoing in collar.  Tight trigger points in neck/shoulders/upper back - still very tight- and cries out with palpation of neck.    Assessment/Plan: 1. Functional deficits which require 3+ hours per day of interdisciplinary therapy in a comprehensive inpatient rehab setting. Physiatrist is providing close team supervision and 24 hour management of active medical problems listed below. Physiatrist and rehab team continue to assess barriers to discharge/monitor patient progress toward functional and medical goals  Care Tool:  Bathing    Body parts bathed by patient: Right arm, Left arm, Chest, Abdomen, Front perineal area, Right upper  leg, Left upper leg, Face   Body parts bathed by helper: Buttocks, Right lower leg, Left lower leg     Bathing assist Assist Level: Moderate Assistance - Patient 50 - 74%     Upper Body Dressing/Undressing Upper body dressing   What is the patient wearing?: Pull over shirt    Upper body assist Assist Level: Moderate Assistance - Patient 50 - 74%    Lower Body Dressing/Undressing Lower body dressing      What is the patient wearing?: Underwear/pull up, Pants     Lower body assist Assist for lower body dressing: Minimal Assistance - Patient > 75%     Toileting Toileting    Toileting  assist Assist for toileting: Moderate Assistance - Patient 50 - 74%     Transfers Chair/bed transfer  Transfers assist     Chair/bed transfer assist level: Supervision/Verbal cueing     Locomotion Ambulation   Ambulation assist      Assist level: Contact Guard/Touching assist Assistive device: Walker-rolling Max distance: 200'   Walk 10 feet activity   Assist     Assist level: Contact Guard/Touching assist Assistive device: Walker-rolling   Walk 50 feet activity   Assist Walk 50 feet with 2 turns activity did not occur: Safety/medical concerns  Assist level: Contact Guard/Touching assist Assistive device: Walker-rolling    Walk 150 feet activity   Assist Walk 150 feet activity did not occur: Safety/medical concerns  Assist level: Contact Guard/Touching assist Assistive device: Walker-rolling    Walk 10 feet on uneven surface  activity   Assist Walk 10 feet on uneven surfaces activity did not occur: Safety/medical concerns   Assist level: Contact Guard/Touching assist Assistive device: Walker-rolling   Wheelchair     Assist Is the patient using a wheelchair?: No             Wheelchair 50 feet with 2 turns activity    Assist            Wheelchair 150 feet activity     Assist          Blood pressure (!) 93/44, pulse 63, temperature 98 F (36.7 C), resp. rate 16, height '5\' 3"'$  (1.6 m), weight 80.8 kg, SpO2 98 %.     Medical Problem List and Plan: 1.  Gait abnormality secondary to severe spondylitic myelopathy with severe stenosis C2-3 3-4/central cord syndrome.  Status post cervical arthrodesis with posterior cervical bilateral laminectomy 08/28/2020.  Cervical collar as directed             -patient may shower             -ELOS/Goals: Supervision/Min A 17-21 days  -fitted with smaller [place] J collar which fits much better.    8/24- d/c date 9/2- con't PT and OT- CIR  Continue PT and OT/CIR- and cervical collar 2.   Antithrombotics: -DVT/anticoagulation: Subcutaneous heparin.  Check vascular study Mechanical: Sequential compression devices, below knee Bilateral lower extremities Pharmaceutical: sq hep  8/25- change to Lovneox since Can take/Cr OK  8/27- tolerating better             -antiplatelet therapy: N/A 3. Pain Management: Oxycodone as needed              Monitor with increased exertion  -dc'ed robaxin d/t sedation.   -continue tizanidine trial  8/23- will increase Oxycodone (get rid of percocet to Oxy '10mg'$ ) to q4 hours prn to help keep pain more stable. Trigger point injections today  8/25-  will add Oxycontin 10 mg BID since having so much pain- seems miserable, even with Oxycodone- cannot take Morphine- causes Hallucinations. 8/27- tolerating OxyContin well and pain down to "7/10"   8/28- had a bad night- increased zanaflex to 4 mg q6 hours prn- no sedation.  4. Mood: Zoloft 50 mg daily, Lamictal 200 mg twice daily             -antipsychotic agents: Abilify 10 mg daily   8/23- mood OK per pt- con't regimen for now 5. Neuropsych: This patient is?  Fully capable of making decisions on her own behalf. 6. Skin/Wound Care: Routine skin checks 7. Fluids/Electrolytes/Nutrition: Routine in and outs             CMP reviewed. Add protein supp for low albumin 8.  Hypertension.  Hydralazine 25 mg twice daily, Avapro 300 mg daily, Norvasc 10 mg daily             borderline. Tizanidine helping too Vitals:   09/14/20 0335 09/14/20 1314  BP: (!) 134/52 (!) 93/44  Pulse: 62 63  Resp: 16 16  Temp: 97.9 F (36.6 C) 98 F (36.7 C)  SpO2: 93% 98%    8/27- BP slightly elevated, but then low this AM- somewhat labile- if continues, might update regimen- will monitor for trend.   8/28- BP soft- but asymptomatic- likely Zanaflex doing it- con't regimen and monitor  8/29: decrease Norvasc to '5mg'$  9.  COPD/history of tobacco use.  Continue inhalers as directed.  Monitor oxygen saturations  -O2 [place] as  needed  8/24- meets criteria per walk test to continue home O2  8/27- on 2L O2 at home- will continue 10.  GERD.  Protonix 11.  CAD with history of stenting.  Plan to discuss with neurosurgery when to resume low-dose aspirin 12.  Diastolic congestive heart failure.  Lasix 20 mg daily.  Monitor for any signs of fluid overload   Filed Weights   09/12/20 0500 09/13/20 0500 09/14/20 0500  Weight: 80.3 kg 78.9 kg 80.8 kg    -8/24- weight up x1 day, then back down- con't to monitor  8/29- weight stable- continue to monitor   -monitor daily 13. Neurogenic bowel: still no bm yesterday despite sorbitol and sse    8/19 mg citrate on backorder -repeat sorbitol and SSE later today if needed -added linzess 8/28- Sorbitol last night- 3 BM's- will wait on more sorbitol for now.  14. Neurogenic bladder:  -continue voiding trial  -I/O cath prn to keep volumes between 300-500cc, patient is not emptying completely  8/24- will add Flomax to help pt void better/more completely.   8/25- now voiding more- will con't flomax  8/29- emptying with voiding- continue flomax     LOS: 12 days A FACE TO [place] 09/14/2020, 1:35 PM

## 2020-09-14 NOTE — Progress Notes (Signed)
Occupational Therapy Session Note  Patient Details  Name: [name] MRN: [id number] Date of Birth: [birth year]  Today's Date: 09/14/2020 OT Individual Time: 1415-1455 OT Individual Time Calculation (min): 40 min    Short Term Goals: Week 1:  OT Short Term Goal 1 (Week 1): patient will complete bed mobility and SPT with mod A OT Short Term Goal 1 - Progress (Week 1): Met OT Short Term Goal 2 (Week 1): patient will complete eating, grooming and upper body adl with mod A using AD/built up handles OT Short Term Goal 2 - Progress (Week 1): Met OT Short Term Goal 3 (Week 1): patient will tolerate unsupported sitting with min A OT Short Term Goal 3 - Progress (Week 1): Met OT Short Term Goal 4 (Week 1): patient will complete toileting max A of one OT Short Term Goal 4 - Progress (Week 1): Met Week 2:  OT Short Term Goal 1 (Week 2): STG=LTG 2/2 ELOS (continue towards supervision/min A goals)   Skilled Therapeutic Interventions/Progress Updates:    Pt greeted at time of session semireclined in bed resting with friend [name] present who remained throughout session. Pt with neck pain, no # given and rest breaks PRN with repositioning. Focus of session on family ed/training with [facility] regarding how to remove and replace pads on C collar. Pt supine > sit Supervision and therapist demonstrating for caregiver how to remove and replace in prep for DC home for showers/cleaning/etc. Caregiver needing Mod cues for problem solving and recommended she only replace one pad at a time to avoid being overwhelmed or confusion. Replaced C collar on pt with caregiver assisting and sit > supine Supervision. Positioned for comfort. Provided heat pack for R knee and pt able to direct location. Alarm on call bell in reach.   Therapy Documentation Precautions:  Precautions Precautions: Cervical, Fall Precaution Comments: watch O2 sats Required Braces or Orthoses: Cervical Brace Cervical Brace: Hard collar, At all  times Restrictions Weight Bearing Restrictions: No     Therapy/Group: Individual Therapy  Viona Gilmore 09/14/2020, 12:31 PM

## 2020-09-14 NOTE — Progress Notes (Signed)
Physical Therapy Session Note  Patient Details  Name: [name] MRN: [id number] Date of Birth: 03/15/1945  Today's Date: 09/14/2020 PT Individual Time: 1030-1125 PT Individual Time Calculation (min): 55 min   Short Term Goals: Week 2:  PT Short Term Goal 1 (Week 2): STG = LTG due to LOS  Skilled Therapeutic Interventions/Progress Updates:    Pt received seated in w/c in room, reports 10/10 pain in her neck and reports just receiving pain medication prior to start of therapy session. Sit to stand with Supervision to RW throughout session. Ambulation x 200 ft with RW and Supervision to CGA, antalgic gait pattern. Pt reports onset of R knee pain and thigh cramping/pain that she experienced yesterday, declines further ambulation. Pt reports hot pack she used previous date for knee pain had no effect on pain. Ascend/descend 5" curb step with RW and min A for balance x 4 reps to simulate home environment, cues for safe RW management. Seated BUE strengthening with 2# dowel rod playing volleyball 2 x 25 reps, bicep curls 2 x 15 reps, chest press 2 x 10 reps to fatigue. Manual w/c propulsion x 50 ft with use of BUE at Supervision level for global endurance training. Pt requests to return to bed at end of session due to fatigue. Stand pivot transfer w/c to bed with RW and close Supervision. Sit to supine Supervision. Pt left seated in bed with needs in reach, bed alarm in place, friend present. Pt on 2L O2 via [place] throughout session, SpO2 90% (+) with activity.  Therapy Documentation Precautions:  Precautions Precautions: Cervical, Fall Precaution Comments: watch O2 sats Required Braces or Orthoses: Cervical Brace Cervical Brace: Hard collar, At all times Restrictions Weight Bearing Restrictions: No     Therapy/Group: Individual Therapy   Excell Seltzer, PT, DPT, CSRS  09/14/2020, 12:10 PM

## 2020-09-15 MED ORDER — TIZANIDINE HCL 4 MG PO TABS
4.0000 mg | ORAL_TABLET | Freq: Four times a day (QID) | ORAL | Status: DC
  Administered 2020-09-15 – 2020-09-18 (×12): 4 mg via ORAL
  Filled 2020-09-15 (×12): qty 1

## 2020-09-15 NOTE — Progress Notes (Signed)
Physical Therapy Session Note  Patient Details  Name: [name] MRN: [id number] Date of Birth: January 22, 1945  Today's Date: 09/15/2020 PT Individual Time: 9758-8325 PT Individual Time Calculation (min): 60 min   Short Term Goals: Week 1:  PT Short Term Goal 1 (Week 1): Pt will perform bed mobility consistently with modA. PT Short Term Goal 1 - Progress (Week 1): Met PT Short Term Goal 2 (Week 1): Pt will peform bed to chair transfer with minA. PT Short Term Goal 2 - Progress (Week 1): Met PT Short Term Goal 3 (Week 1): Pt will ambulate x50' with modA +1 and LRAD. PT Short Term Goal 3 - Progress (Week 1): Met  Skilled Therapeutic Interventions/Progress Updates:    pt received in bed and agreeable to therapy. Pt reports pain in her neck and back, no change from previous days, therapy to tolerance. Pt on 2L O2 throughout session:  Bed mobility with supervision. Stand pivot transfer w/c<>bed with CGA with RW. Therapist managed O2 line and tank throughout session.   Gait training: Session focused on training for curb navigation, including the following exercises with CGA and RW set up on 8" curb step: -step taps x 10 BIL -alternating step taps x 20 - Step ups x 10 BIL  - curb step with RW and CGA x 1 with instruction and 2 x 2 with min cueing for technique.   Pt then ambulated x 180 ft with RW and CGA, 2 standing rest breaks with instruction to walk quickly for cardiovascular endurance.   Manual therapy: Pt c/o cramping pain in R thigh worsened after gait training. Manual therapy to R quad to tolerance, pt reported mild improvement in pain. Palpable trigger point noted in lateral distal aspect of R quad.   Pt returned to bed after session as noted above and was left with all needs in reach and alarm active.    Therapy Documentation Precautions:  Precautions Precautions: Cervical, Fall Precaution Comments: watch O2 sats Required Braces or Orthoses: Cervical Brace Cervical  Brace: Hard collar, At all times Restrictions Weight Bearing Restrictions: No   Therapy/Group: Individual Therapy  Mickel Fuchs 09/15/2020, 4:41 PM

## 2020-09-15 NOTE — Progress Notes (Signed)
Physical Therapy Session Note  Patient Details  Name: [name] MRN: [id number] Date of Birth: 1945/04/06  Today's Date: 09/15/2020 PT Individual Time: 1300-1400 PT Individual Time Calculation (min): 60 min   Short Term Goals: Week 2:  PT Short Term Goal 1 (Week 2): STG = LTG due to LOS  Skilled Therapeutic Interventions/Progress Updates:    Pt received seated in bed, agreeable to PT session. Pt reports 9/10 pain in her neck, declines intervention. Bed mobility Supervision with use of bedrail and HOB elevated. Stand pivot transfer to w/c with RW and Supervision. Nustep level 4 x 5 min with use of B UE/LE for global endurance training, one seated rest break. Pt reports some pain in R knee with Nustep, not rated and declines intervention. Standing balance performing horseshoe toss, 3 x 8 reps with RW and CGA for balance. Standing alt L/R cone taps 2 x 25 reps with RW and CGA for balance. Pt on 2L O2 throughout session, SpO2 drops to 86% with activity and returns to 90% (+) with rest break and pursed lip breathing techniques. Pt requests to return to bed at end of session, Supervision for bed mobility. Pt left seated in bed with needs in reach, bed alarm in place.  Therapy Documentation Precautions:  Precautions Precautions: Cervical, Fall Precaution Comments: watch O2 sats Required Braces or Orthoses: Cervical Brace Cervical Brace: Hard collar, At all times Restrictions Weight Bearing Restrictions: No     Therapy/Group: Individual Therapy   Excell Seltzer, PT, DPT, CSRS  09/15/2020, 5:22 PM

## 2020-09-15 NOTE — Progress Notes (Addendum)
Patient ID: [name], female   DOB: 07/02/1945, 75 y.o.   MRN: [id number]  SW waiting on updates from [place] on oxygen and if recertification form will be approved.   SW met with pt and [name] to provide updates from team conference on gains made, and d/c date remains. SW informed prefers [place]. SW will f/u once there is an update on oxygen.   SW sent HHPT/OT/aide/SN to [facility] and waiting on follow-up.  *SW received updates from [place] that oxygen order was approved.  *HH referral accepted by [facility].  Loralee Pacas, MSW, [place] Office: [phone] Cell: [phone] Fax: [phone]

## 2020-09-15 NOTE — Patient Care Conference (Signed)
Inpatient RehabilitationTeam Conference and Plan of Care Update Date: 09/15/2020   Time: 11:18 AM    Patient Name: [name]      Medical Record Number: [id number]  Date of Birth: 1945-08-23 Sex: Female         Room/Bed: 4W04C/4W04C-01 Payor Info: Payor: MEDICARE / Plan: MEDICARE PART A AND B / Product Type: *No Product type* /    Admit Date/Time:  09/02/2020  1:59 PM  Primary Diagnosis:  Central cord syndrome [facility])  Hospital Problems: Principal Problem:   Central cord syndrome ([place]) Active Problems:   COPD (chronic obstructive pulmonary disease) ([place])   Neurogenic bladder   Pressure injury of skin    Expected Discharge Date: Expected Discharge Date: 09/18/20  Team Members Present: Physician leading conference: Dr. Courtney Heys Social Worker Present: Loralee Pacas, [place] Nurse Present: Dorthula Nettles, RN PT Present: Excell Seltzer, PT OT Present: Roanna Epley, [place], OT PPS Coordinator present : Gunnar Fusi, SLP     Current Status/Progress Goal Weekly Team Focus  Bowel/Bladder   Pt is cont of B/B. LBM is 09/13/20  Pt to remain cont of B/B  Toilet pt as needed and encourage pt to void   Swallow/Nutrition/ Hydration             ADL's   bathing Mod (did shower on 8/29!), UB dress Mod, LB dress Min w/ AE, CGA/S transfers, neck pain limiting  bathing-min A: LB dressing-min A: toileting-min A; functional transfers-CGA/supervisoin  BADL retraining, global endurance/tolerance, family/pt education, pain management, continue AE training   Mobility   Supervision bed mobility and transfers with RW, gait up to 200 ft with RW CGA, min A step navigation with RW  supervision overall, CGA stairs  gait, stairs, pain management, strengthening, endurance   Communication             Safety/Cognition/ Behavioral Observations            Pain   Pt c/o of pain at a 9-10 out of 10.  Pt pain to be < 3 on pain scale  Assess pt for pain qshift and provide PRN as needed    Skin   Pt has a surgical incision on neck covered in honeycomb dressing and a stage 2 w/excoriated bottom on sacrum covered with foam  Pt skin to be clean and free of breakdown  Assess pt skin qshit and provide skin care and pressure relief methods     Discharge Planning:  D/c to home with assistance from roommate [place] who can provide supervision. Pt needs to be atleast Mod I to supervision at d/c.   Team Discussion: OxyContin '10mg'$  BID, muscle relaxer now scheduled, BP on low side but asymptomatic. Decreased Norvasc. Voiding but emptying? Do PVR's. Continent B/B, on-going pain. Dressing on neck, stage 2 to right and left buttocks with foam applied. Supervision/contact guard overall. Supervision goals. Pushes through the pain. Pain limiting but pushes through for shower. Using adaptive equipment. Contact guard transfers, min assist with upper body and lower body, mod assist for socks.  Patient on target to meet rehab goals: yes  *See Care Plan and progress notes for long and short-term goals.   Revisions to Treatment Plan:  Increased Zanaflex to q 6hr, scheduled.  Teaching Needs: Family education, medication management, pain management, skin/wound care, transfer training, gait training, balance training, endurance training, safety awareness.  Current Barriers to Discharge: Decreased caregiver support, Medical stability, Home enviroment access/layout, Wound care, Weight, Weight bearing restrictions, Medication compliance, and New  oxygen  Possible Resolutions to Barriers: Continue current medications, provide emotional support.     Medical Summary Current Status: pain still a major limiter for her; 7-10/10; LBM yesterday- continent B/B; was refusing Linzess- d/w pt to continue meds; foam dressing on Stage II R and L buttocks- ischium?  Barriers to Discharge: Decreased family/caregiver support;Home enviroment access/layout;Medical stability;Weight;New oxygen;Weight bearing restrictions;Wound  care  Barriers to Discharge Comments: trying to get O2 approved- long term home O2- needs re-approvval Possible Resolutions to [employer] Focus: S-CGA- pushes through pain well- pain can limit OT- got shower yesterday- focus- increased zanaflex to q6 hours scheduled; and monitor skin issues/pressure ulcers- con't cervical collar- d/c Friday 9/2   Continued Need for Acute Rehabilitation Level of Care: The patient requires daily medical management by a physician with specialized training in physical medicine and rehabilitation for the following reasons: Direction of a multidisciplinary physical rehabilitation program to maximize functional independence : Yes Medical management of patient stability for increased activity during participation in an intensive rehabilitation regime.: Yes Analysis of laboratory values and/or radiology reports with any subsequent need for medication adjustment and/or medical intervention. : Yes   I attest that I was present, lead the team conference, and concur with the assessment and plan of the team.   Cristi Loron 09/15/2020, 4:50 PM

## 2020-09-15 NOTE — Progress Notes (Signed)
Occupational Therapy Session Note  Patient Details  Name: [name] MRN: [id number] Date of Birth: [birth year]  Today's Date: 09/15/2020 OT Individual Time: [id number] OT Individual Time Calculation (min): 70 min    Short Term Goals: Week 2:  OT Short Term Goal 1 (Week 2): STG=LTG 2/2 ELOS (continue towards supervision/min A goals)  Skilled Therapeutic Interventions/Progress Updates:    Pt resting in bed upon arrival with RN and NT present. Pt had just used BSC. RN remained for med pass. OT intervention with focus on LB dressing, sit<>stand, bed mobility, standing balance, and activity tolerance to increase indepdnence with BADLs. O2 sats>90% on 2L O2 throughout session. Sit<>stand and stand pivot transfers with CGA. Pt used reacher to thread BLE into pants. Min A to pull over hips when standing. Standing balance with CGA. Pt fatigues quickly and requires multiple rest breaks. Pt experienced considerable pain throughout sessions necessitating multiple rest breaks for pain mgmt. Pt requested to return to bed to provide support for head/neck. Sit>supine with supervision. Pt remained in bed with all needs within reach and bed alarm activated.   Therapy Documentation Precautions:  Precautions Precautions: Cervical, Fall Precaution Comments: watch O2 sats Required Braces or Orthoses: Cervical Brace Cervical Brace: Hard collar, At all times Restrictions Weight Bearing Restrictions: No  Pain: Pain Assessment Pain Scale: 0-10 Pain Score: 10-Worst pain ever Repositioned, emotional support  Therapy/Group: Individual Therapy  Leroy Libman 09/15/2020, 9:27 AM

## 2020-09-15 NOTE — Progress Notes (Signed)
PROGRESS NOTE   Subjective/Complaints:  Pt reports in general neck pain is better with Oxycontin and oxycodone, but sometimes still spikes up - muscle relaxer is "heaven" and works so well for her.  Stopped taking linzess 2 days ago- scred of bowel accident, but hadn't had one- actually, needs to poop this AM- suggested not refusing linzess esp with all the opioids she's taking.   ROS:  Pt denies SOB, abd pain, CP, N/V/C/D, and vision changes   Objective:   No results found. No results for input(s): WBC, HGB, HCT, PLT in the last 72 hours.  No results for input(s): NA, K, CL, CO2, GLUCOSE, BUN, CREATININE, CALCIUM in the last 72 hours.   Intake/Output Summary (Last 24 hours) at 09/15/2020 1030 Last data filed at 09/15/2020 0745 Gross per 24 hour  Intake 240 ml  Output --  Net 240 ml     Pressure Injury 09/11/20 Buttocks Left Stage 2 -  Partial thickness loss of dermis presenting as a shallow open injury with a red, pink wound bed without slough. non-blanchable area with very thin dry skin flakes covering the center (Active)  09/11/20 1441  Location: Buttocks  Location Orientation: Left  Staging: Stage 2 -  Partial thickness loss of dermis presenting as a shallow open injury with a red, pink wound bed without slough.  Wound Description (Comments): non-blanchable area with very thin dry skin flakes covering the center  Present on Admission:      Pressure Injury 09/11/20 Buttocks Right Stage 2 -  Partial thickness loss of dermis presenting as a shallow open injury with a red, pink wound bed without slough. non-blanchable area with thin layer skin flakes covering the center (Active)  09/11/20 1445  Location: Buttocks  Location Orientation: Right  Staging: Stage 2 -  Partial thickness loss of dermis presenting as a shallow open injury with a red, pink wound bed without slough.  Wound Description (Comments): non-blanchable area  with thin layer skin flakes covering the center  Present on Admission:     Physical Exam: Vital Signs Blood pressure (!) 147/63, pulse (!) 59, temperature (!) 97.5 F (36.4 C), temperature source Oral, resp. rate 18, height '5\' 3"'$  (1.6 m), weight 80.8 kg, SpO2 96 %.   General: awake, alert, appropriate, wearing O2 by ; NAD HENT: conjugate gaze; oropharynx moist; cervical collar in place CV: regular rate; no JVD Pulmonary: CTA B/L; no W/R/R- good air movement- O2 2L- appears comfortable GI: soft, NT, ND, (+)BS Psychiatric: appropriate Neurological: Ox3  Skin: cervical wound cdi with dry dressing. - . A few scattered lacs and abrasions on legs/arms, bruises remain.  Neuro:  fairly alert, oriented. Follows commands. Able to carry conversation. No CN findings. RUE 4+/5. LUE 4, decreased sensation to LT/pain LUE>LUE. LE4- prox to 4/5 distally. No sensory findings in legs. DTR's 3+ Musculoskeletal: cervicalgia ongoing in collar.  Tight trigger points in neck/shoulders/upper back - still very tight- and cries out with palpation of neck.    Assessment/Plan: 1. Functional deficits which require 3+ hours per day of interdisciplinary therapy in a comprehensive inpatient rehab setting. Physiatrist is providing close team supervision and 24 hour management of active medical problems  listed below. Physiatrist and rehab team continue to assess barriers to discharge/monitor patient progress toward functional and medical goals  Care Tool:  Bathing    Body parts bathed by patient: Right arm, Left arm, Chest, Abdomen, Front perineal area, Right upper leg, Left upper leg, Face   Body parts bathed by helper: Buttocks, Right lower leg, Left lower leg     Bathing assist Assist Level: Moderate Assistance - Patient 50 - 74%     Upper Body Dressing/Undressing Upper body dressing   What is the patient wearing?: Pull over shirt    Upper body assist Assist Level: Moderate Assistance - Patient 50 -  74%    Lower Body Dressing/Undressing Lower body dressing      What is the patient wearing?: Pants     Lower body assist Assist for lower body dressing: Minimal Assistance - Patient > 75%     Toileting Toileting    Toileting assist Assist for toileting: Moderate Assistance - Patient 50 - 74%     Transfers Chair/bed transfer  Transfers assist     Chair/bed transfer assist level: Supervision/Verbal cueing     Locomotion Ambulation   Ambulation assist      Assist level: Contact Guard/Touching assist Assistive device: Walker-rolling Max distance: 200'   Walk 10 feet activity   Assist     Assist level: Contact Guard/Touching assist Assistive device: Walker-rolling   Walk 50 feet activity   Assist Walk 50 feet with 2 turns activity did not occur: Safety/medical concerns  Assist level: Contact Guard/Touching assist Assistive device: Walker-rolling    Walk 150 feet activity   Assist Walk 150 feet activity did not occur: Safety/medical concerns  Assist level: Contact Guard/Touching assist Assistive device: Walker-rolling    Walk 10 feet on uneven surface  activity   Assist Walk 10 feet on uneven surfaces activity did not occur: Safety/medical concerns   Assist level: Contact Guard/Touching assist Assistive device: Walker-rolling   Wheelchair     Assist Is the patient using a wheelchair?: No             Wheelchair 50 feet with 2 turns activity    Assist            Wheelchair 150 feet activity     Assist          Blood pressure (!) 147/63, pulse (!) 59, temperature (!) 97.5 F (36.4 C), temperature source Oral, resp. rate 18, height '5\' 3"'$  (1.6 m), weight 80.8 kg, SpO2 96 %.     Medical Problem List and Plan: 1.  Gait abnormality secondary to severe spondylitic myelopathy with severe stenosis C2-3 3-4/central cord syndrome.  Status post cervical arthrodesis with posterior cervical bilateral laminectomy 08/28/2020.   Cervical collar as directed             -patient may shower             -ELOS/Goals: Supervision/Min A 17-21 days  -fitted with smaller [place] J collar which fits much better.    8/24- d/c date 9/2- con't PT and OT- CIR  Con't PT and OT- 9/2 is d/c date? Team conference today 2.  Antithrombotics: -DVT/anticoagulation: Subcutaneous heparin.  Check vascular study Mechanical: Sequential compression devices, below knee Bilateral lower extremities Pharmaceutical: sq hep  8/25- change to Lovneox since Can take/Cr OK  8/27- tolerating better             -antiplatelet therapy: N/A 3. Pain Management: Oxycodone as needed  Monitor with increased exertion  -dc'ed robaxin d/t sedation.   -continue tizanidine trial  8/23- will increase Oxycodone (get rid of percocet to Oxy '10mg'$ ) to q4 hours prn to help keep pain more stable. Trigger point injections today  8/25- will add Oxycontin 10 mg BID since having so much pain- seems miserable, even with Oxycodone- cannot take Morphine- causes Hallucinations. 8/27- tolerating OxyContin well and pain down to "7/10"  8/30- will change zanaflex to q6 hours scheduled.  4. Mood: Zoloft 50 mg daily, Lamictal 200 mg twice daily             -antipsychotic agents: Abilify 10 mg daily   8/23- mood OK per pt- con't regimen for now 5. Neuropsych: This patient is?  Fully capable of making decisions on her own behalf. 6. Skin/Wound Care: Routine skin checks 7. Fluids/Electrolytes/Nutrition: Routine in and outs             CMP reviewed. Add protein supp for low albumin 8.  Hypertension.  Hydralazine 25 mg twice daily, Avapro 300 mg daily, Norvasc 10 mg daily             borderline. Tizanidine helping too Vitals:   09/14/20 1954 09/15/20 0300  BP: (!) 105/51 (!) 147/63  Pulse: (!) 57 (!) 59  Resp: 19 18  Temp: 98 F (36.7 C) (!) 97.5 F (36.4 C)  SpO2: 91% 96%    8/27- BP slightly elevated, but then low this AM- somewhat labile- if continues, might update  regimen- will monitor for trend.   8/28- BP soft- but asymptomatic- likely Zanaflex doing it- con't regimen and monitor  8/29: decrease Norvasc to '5mg'$  9.  COPD/history of tobacco use.  Continue inhalers as directed.  Monitor oxygen saturations  -O2 [place] as needed  8/24- meets criteria per walk test to continue home O2  8/27- on 2L O2 at home- will continue 10.  GERD.  Protonix 11.  CAD with history of stenting.  Plan to discuss with neurosurgery when to resume low-dose aspirin 12.  Diastolic congestive heart failure.  Lasix 20 mg daily.  Monitor for any signs of fluid overload   Filed Weights   09/13/20 0500 09/14/20 0500 09/15/20 0500  Weight: 78.9 kg 80.8 kg 80.8 kg    -8/24- weight up x1 day, then back down- con't to monitor  8/29- weight stable- continue to monitor   -monitor daily 13. Neurogenic bowel: still no bm yesterday despite sorbitol and sse    8/19 mg citrate on backorder -repeat sorbitol and SSE later today if needed -added linzess 8/28- Sorbitol last night- 3 BM's- will wait on more sorbitol for now. 8/30- feels like needs to go- told her to not hold Linzess  14. Neurogenic bladder:  -continue voiding trial  -I/O cath prn to keep volumes between 300-500cc, patient is not emptying completely  8/24- will add Flomax to help pt void better/more completely.   8/25- now voiding more- will con't flomax  8/29- emptying with voiding- continue flomax     LOS: 13 days A FACE TO FACE EVALUATION WAS PERFORMED  Xandra Laramee 09/15/2020, 10:30 AM

## 2020-09-16 ENCOUNTER — Ambulatory Visit: Payer: Medicare Other | Admitting: Pulmonary Disease

## 2020-09-16 NOTE — Progress Notes (Signed)
Physical Therapy Session Note  Patient Details  Name: [name] MRN: [id number] Date of Birth: January 01, 1946  Today's Date: 09/16/2020 PT Individual Time: 1135-1213 PT Individual Time Calculation (min): 38 min   Short Term Goals: Week 2:  PT Short Term Goal 1 (Week 2): STG = LTG due to LOS  Skilled Therapeutic Interventions/Progress Updates: Pt presented in bed agreeable to therapy. Pt states pain 9/10 but no intervention requested. Pt performed bed mobility with use of bed features and supervision. Performed ambulatory transfer to w/c and pt transported to ADL apt. Practiced ambulatory transfer to recliner as more similar to one at home. Performed recliner transfer with supervision however required x 2 attempts to stand due to posterior LOB (pt's recliner at home is stationary vs the rocker in ADL apt). Pt then ambulated to ortho gym and transferred to [place]. Participated in L3 x 6 min for general conditioning. Pt transported back to room and requested to use toilet. Performed ambulatory transfer to [facility] and pt was able to perform clothing management with CGA. Performed ambulatory transfer to bed and supervision to supine. Pt set up for lunch with friend [name] present and left with call bell within reach and needs met.      Therapy Documentation Precautions:  Precautions Precautions: Cervical, Fall Precaution Comments: watch O2 sats Required Braces or Orthoses: Cervical Brace Cervical Brace: Hard collar, At all times Restrictions Weight Bearing Restrictions: No General:   Vital Signs: Therapy Vitals Temp: 98.1 F (36.7 C) Pulse Rate: 70 Resp: 18 BP: (!) 93/46 Patient Position (if appropriate): Lying Oxygen Therapy SpO2: 92 % O2 Device: Room Air Pain: Pain Assessment Pain Scale: 0-10 Pain Score: 9  Pain Type: Acute pain Pain Location: Back Pain Orientation: Lower Pain Descriptors / Indicators: Aching Pain Frequency: Constant Pain Onset: On-going Patients Stated Pain  Goal: 1 Pain Intervention(s): Medication (See eMAR) Mobility:   Locomotion :    Trunk/Postural Assessment :    Balance:   Exercises:   Other Treatments:      Therapy/Group: Individual Therapy  Jemar Paulsen 09/16/2020, 2:29 PM

## 2020-09-16 NOTE — Progress Notes (Signed)
PROGRESS NOTE   Subjective/Complaints:  Pt reports doesn't need linzess at home, however wants to continue for now- refused again this AM- LBM yesterday.  Poor sleep due to neck pain- at 7/10 now with both meds.  Shower scheduled for tomorrow.  ROS:   Pt denies SOB, abd pain, CP, N/V/C/D, and vision changes    Objective:   No results found. No results for input(s): WBC, HGB, HCT, PLT in the last 72 hours.  No results for input(s): NA, K, CL, CO2, GLUCOSE, BUN, CREATININE, CALCIUM in the last 72 hours.   Intake/Output Summary (Last 24 hours) at 09/16/2020 1850 Last data filed at 09/16/2020 1335 Gross per 24 hour  Intake 840 ml  Output --  Net 840 ml     Pressure Injury 09/11/20 Buttocks Left Stage 2 -  Partial thickness loss of dermis presenting as a shallow open injury with a red, pink wound bed without slough. non-blanchable area with very thin dry skin flakes covering the center (Active)  09/11/20 1441  Location: Buttocks  Location Orientation: Left  Staging: Stage 2 -  Partial thickness loss of dermis presenting as a shallow open injury with a red, pink wound bed without slough.  Wound Description (Comments): non-blanchable area with very thin dry skin flakes covering the center  Present on Admission:      Pressure Injury 09/11/20 Buttocks Right Stage 2 -  Partial thickness loss of dermis presenting as a shallow open injury with a red, pink wound bed without slough. non-blanchable area with thin layer skin flakes covering the center (Active)  09/11/20 1445  Location: Buttocks  Location Orientation: Right  Staging: Stage 2 -  Partial thickness loss of dermis presenting as a shallow open injury with a red, pink wound bed without slough.  Wound Description (Comments): non-blanchable area with thin layer skin flakes covering the center  Present on Admission:     Physical Exam: Vital Signs Blood pressure 102/62,  pulse 70, temperature 98.1 F (36.7 C), resp. rate 18, height '5\' 3"'$  (1.6 m), weight 78.4 kg, SpO2 92 %.    General: awake, alert, appropriate, NAD HENT: conjugate gaze; oropharynx moist- cervical collar CV: regular rate; no JVD Pulmonary: CTA B/L; no W/R/R- good air movement- O2-chronic GI: soft, NT, ND, (+)BS Psychiatric: appropriate Neurological: Ox3  Skin: cervical wound cdi with dry dressing. - . A few scattered lacs and abrasions on legs/arms, bruises remain.  Neuro:  fairly alert, oriented. Follows commands. Able to carry conversation. No CN findings. RUE 4+/5. LUE 4, decreased sensation to LT/pain LUE>LUE. LE4- prox to 4/5 distally. No sensory findings in legs. DTR's 3+ Musculoskeletal: cervicalgia ongoing in collar.  Tight trigger points in neck/shoulders/upper back - still very tight- and cries out with palpation of neck.    Assessment/Plan: 1. Functional deficits which require 3+ hours per day of interdisciplinary therapy in a comprehensive inpatient rehab setting. Physiatrist is providing close team supervision and 24 hour management of active medical problems listed below. Physiatrist and rehab team continue to assess barriers to discharge/monitor patient progress toward functional and medical goals  Care Tool:  Bathing    Body parts bathed by patient: Right arm, Left arm,  Chest, Abdomen, Front perineal area, Right upper leg, Left upper leg, Face   Body parts bathed by helper: Buttocks, Right lower leg, Left lower leg     Bathing assist Assist Level: Moderate Assistance - Patient 50 - 74%     Upper Body Dressing/Undressing Upper body dressing   What is the patient wearing?: Pull over shirt    Upper body assist Assist Level: Minimal Assistance - Patient > 75%    Lower Body Dressing/Undressing Lower body dressing      What is the patient wearing?: Pants     Lower body assist Assist for lower body dressing: Minimal Assistance - Patient > 75%      Toileting Toileting    Toileting assist Assist for toileting: Moderate Assistance - Patient 50 - 74%     Transfers Chair/bed transfer  Transfers assist     Chair/bed transfer assist level: Supervision/Verbal cueing     Locomotion Ambulation   Ambulation assist      Assist level: Contact Guard/Touching assist Assistive device: Walker-rolling Max distance: 200'   Walk 10 feet activity   Assist     Assist level: Contact Guard/Touching assist Assistive device: Walker-rolling   Walk 50 feet activity   Assist Walk 50 feet with 2 turns activity did not occur: Safety/medical concerns  Assist level: Contact Guard/Touching assist Assistive device: Walker-rolling    Walk 150 feet activity   Assist Walk 150 feet activity did not occur: Safety/medical concerns  Assist level: Contact Guard/Touching assist Assistive device: Walker-rolling    Walk 10 feet on uneven surface  activity   Assist Walk 10 feet on uneven surfaces activity did not occur: Safety/medical concerns   Assist level: Contact Guard/Touching assist Assistive device: Walker-rolling   Wheelchair     Assist Is the patient using a wheelchair?: No             Wheelchair 50 feet with 2 turns activity    Assist            Wheelchair 150 feet activity     Assist          Blood pressure 102/62, pulse 70, temperature 98.1 F (36.7 C), resp. rate 18, height '5\' 3"'$  (1.6 m), weight 78.4 kg, SpO2 92 %.     Medical Problem List and Plan: 1.  Gait abnormality secondary to severe spondylitic myelopathy with severe stenosis C2-3 3-4/central cord syndrome.  Status post cervical arthrodesis with posterior cervical bilateral laminectomy 08/28/2020.  Cervical collar as directed             -patient may shower             -ELOS/Goals: Supervision/Min A 17-21 days  -fitted with smaller [place] J collar which fits much better.    8/24- d/c date 9/2- con't PT and OT- CIR  Con't PT and  OT- 9/2 is d/c date? Team conference today  8/31- d/c Friday- con't PT and OT 2.  Antithrombotics: -DVT/anticoagulation: Subcutaneous heparin.  Check vascular study Mechanical: Sequential compression devices, below knee Bilateral lower extremities Pharmaceutical: sq hep  8/25- change to Lovneox since Can take/Cr OK  8/27- tolerating better             -antiplatelet therapy: N/A 3. Pain Management: Oxycodone as needed              Monitor with increased exertion  -dc'ed robaxin d/t sedation.   -continue tizanidine trial  8/23- will increase Oxycodone (get rid of percocet to Oxy '10mg'$ ) to  q4 hours prn to help keep pain more stable. Trigger point injections today  8/25- will add Oxycontin 10 mg BID since having so much pain- seems miserable, even with Oxycodone- cannot take Morphine- causes Hallucinations. 8/27- tolerating OxyContin well and pain down to "7/10"  8/30- will change zanaflex to q6 hours scheduled.   8/31- doesn't want med changes- cnt' regimen 4. Mood: Zoloft 50 mg daily, Lamictal 200 mg twice daily             -antipsychotic agents: Abilify 10 mg daily   8/23- mood OK per pt- con't regimen for now 5. Neuropsych: This patient is?  Fully capable of making decisions on her own behalf. 6. Skin/Wound Care: Routine skin checks 7. Fluids/Electrolytes/Nutrition: Routine in and outs             CMP reviewed. Add protein supp for low albumin 8.  Hypertension.  Hydralazine 25 mg twice daily, Avapro 300 mg daily, Norvasc 10 mg daily             borderline. Tizanidine helping too Vitals:   09/16/20 1425 09/16/20 1432  BP: (!) 93/46 102/62  Pulse: 70   Resp: 18   Temp: 98.1 F (36.7 C)   SpO2: 92%     8/27- BP slightly elevated, but then low this AM- somewhat labile- if continues, might update regimen- will monitor for trend.   8/28- BP soft- but asymptomatic- likely Zanaflex doing it- con't regimen and monitor  8/31- BP still on low side- asyptomatic- con't regimen 9.  COPD/history  of tobacco use.  Continue inhalers as directed.  Monitor oxygen saturations  -O2 [place] as needed  8/24- meets criteria per walk test to continue home O2  8/27- on 2L O2 at home- will continue 10.  GERD.  Protonix 11.  CAD with history of stenting.  Plan to discuss with neurosurgery when to resume low-dose aspirin 12.  Diastolic congestive heart failure.  Lasix 20 mg daily.  Monitor for any signs of fluid overload   Filed Weights   09/14/20 0500 09/15/20 0500 09/16/20 0500  Weight: 80.8 kg 80.8 kg 78.4 kg    -8/24- weight up x1 day, then back down- con't to monitor  8/29- weight stable- continue to monitor   -monitor daily 13. Neurogenic bowel: still no bm yesterday despite sorbitol and sse    8/19 mg citrate on backorder -repeat sorbitol and SSE later today if needed -added linzess 8/28- Sorbitol last night- 3 BM's- will wait on more sorbitol for now. 8/30- feels like needs to go- told her to not hold Linzess  14. Neurogenic bladder:  -continue voiding trial  -I/O cath prn to keep volumes between 300-500cc, patient is not emptying completely  8/24- will add Flomax to help pt void better/more completely.   8/25- now voiding more- will con't flomax  8/29- emptying with voiding- continue flomax     LOS: 14 days A FACE TO FACE EVALUATION WAS PERFORMED  [name] 09/16/2020, 6:50 PM

## 2020-09-16 NOTE — Progress Notes (Signed)
Patient ID: [name], female   DOB: 1945/08/04, 75 y.o.   MRN: [id number]  SW met with pt and [name] in room to provide updates on [facility] accepted, and informed on pt approved for oxygen re-certification. SW encouraged [name] to bring in oxygen tank from home. Reports they have portable oxygen tanks but they do not know how to fill. SW informed will get oxygen tank for d/c and request someone to come out and educate on how to fill tanks.  SW placed order with [employer] via parachute on above.   Loralee Pacas, MSW, [place] Office: [phone] Cell: [phone] Fax: [phone]

## 2020-09-16 NOTE — Discharge Summary (Signed)
Physician Discharge Summary  Patient ID: [name] MRN: [id number] DOB/AGE: [birth year] 75 y.o.  Admit date: 09/02/2020 Discharge date: 09/18/2020  Discharge Diagnoses:  Principal Problem:   Central cord syndrome [facility]) Active Problems:   COPD (chronic obstructive pulmonary disease) (HCC)   Neurogenic bladder   Pressure injury of skin Pain management Mood stabilization Hypertension GERD CAD with history of stenting Diastolic congestive heart failure Remote tobacco use Alcohol use Left renal mass partial nephrectomy  Discharged Condition: Stable  Significant Diagnostic Studies: DG Cervical Spine 2 or 3 views  Result Date: 08/28/2020 CLINICAL DATA:  Surgery. EXAM: DG C-ARM 1-60 MIN; CERVICAL SPINE - 2-3 VIEW FLUOROSCOPY TIME:  Fluoroscopy Time:  12 seconds. Number of Acquired Spot Images: 1 COMPARISON:  None. FINDINGS: A single C-arm fluoroscopic image was obtained intraoperatively and submitted for post operative interpretation. This image demonstrate bilateral lateral mass screws at C2 and C4. Retractors and surgical devices project posteriorly in the posterior paraspinal soft tissues. Please see the performing provider's procedural report for further detail. IMPRESSION: Intraoperative fluoroscopy, as detailed above. Electronically Signed   By: Margaretha Sheffield M.D.   On: 08/28/2020 18:36   CT Head Wo Contrast  Result Date: 08/25/2020 CLINICAL DATA:  Head trauma fall EXAM: CT HEAD WITHOUT CONTRAST CT CERVICAL SPINE WITHOUT CONTRAST TECHNIQUE: Multidetector CT imaging of the head and cervical spine was performed following the standard protocol without intravenous contrast. Multiplanar CT image reconstructions of the cervical spine were also generated. COMPARISON:  CT 12/16/2019 FINDINGS: CT HEAD FINDINGS Brain: No acute territorial infarction, hemorrhage or intracranial mass. Atrophy and mild chronic small vessel ischemic changes of the white matter. Nonenlarged ventricles Vascular:  No hyperdense vessels. Vertebral and carotid vascular calcification Skull: Normal. Negative for fracture or focal lesion. Sinuses/Orbits: No acute finding. Other: None CT CERVICAL SPINE FINDINGS Alignment: Reversal of cervical lordosis. 3 mm anterolisthesis C3 on C4. Trace anterolisthesis C4 on C5. Facet alignment within normal limits. Skull base and vertebrae: No acute fracture. No primary bone lesion or focal pathologic process. Soft tissues and spinal canal: No prevertebral fluid or swelling. No visible canal hematoma. Disc levels: Diffuse degenerative changes throughout the cervical spine with multiple level disc space narrowing and osteophyte. Facet degenerative changes at multiple levels with foraminal stenosis. Upper chest: Negative. Other: None IMPRESSION: 1. No CT evidence for acute intracranial abnormality. Atrophy and chronic small vessel ischemic changes of the white matter. 2. Degenerative changes of the cervical spine.  No fracture is seen Electronically Signed   By: Donavan Foil M.D.   On: 08/25/2020 20:08   CT Cervical Spine Wo Contrast  Result Date: 08/25/2020 CLINICAL DATA:  Head trauma fall EXAM: CT HEAD WITHOUT CONTRAST CT CERVICAL SPINE WITHOUT CONTRAST TECHNIQUE: Multidetector CT imaging of the head and cervical spine was performed following the standard protocol without intravenous contrast. Multiplanar CT image reconstructions of the cervical spine were also generated. COMPARISON:  CT 12/16/2019 FINDINGS: CT HEAD FINDINGS Brain: No acute territorial infarction, hemorrhage or intracranial mass. Atrophy and mild chronic small vessel ischemic changes of the white matter. Nonenlarged ventricles Vascular: No hyperdense vessels. Vertebral and carotid vascular calcification Skull: Normal. Negative for fracture or focal lesion. Sinuses/Orbits: No acute finding. Other: None CT CERVICAL SPINE FINDINGS Alignment: Reversal of cervical lordosis. 3 mm anterolisthesis C3 on C4. Trace anterolisthesis C4  on C5. Facet alignment within normal limits. Skull base and vertebrae: No acute fracture. No primary bone lesion or focal pathologic process. Soft tissues and spinal canal: No prevertebral fluid or  swelling. No visible canal hematoma. Disc levels: Diffuse degenerative changes throughout the cervical spine with multiple level disc space narrowing and osteophyte. Facet degenerative changes at multiple levels with foraminal stenosis. Upper chest: Negative. Other: None IMPRESSION: 1. No CT evidence for acute intracranial abnormality. Atrophy and chronic small vessel ischemic changes of the white matter. 2. Degenerative changes of the cervical spine.  No fracture is seen Electronically Signed   By: Donavan Foil M.D.   On: 08/25/2020 20:08   MR Cervical Spine Wo Contrast  Result Date: 08/26/2020 CLINICAL DATA:  Initial evaluation for myelopathy, acute or progressive. EXAM: MRI CERVICAL SPINE WITHOUT CONTRAST TECHNIQUE: Multiplanar, multisequence MR imaging of the cervical spine was performed. No intravenous contrast was administered. COMPARISON:  CT from earlier the same day. FINDINGS: Alignment: Reversal of the normal cervical lordosis with focal kyphotic angulation at the cervicothoracic junction. Grade 1 anterolisthesis of C3 on C4, C4 on C5, and C5 on C6, chronic and facet mediated. Vertebrae: Vertebral body height maintained without acute or chronic fracture. Bone marrow signal intensity within normal limits. No worrisome osseous lesions. No abnormal marrow edema. Cord: Signal intensity within the cervical spinal cord is within normal limits. Posterior Fossa, vertebral arteries, paraspinal tissues: Remote lacunar infarct present at the pons. Age-related cerebral atrophy noted within the visualized brain and posterior fossa. Craniocervical junction normal. Paraspinous and prevertebral soft tissues demonstrate no acute finding. Normal flow voids preserved within the vertebral arteries bilaterally. Disc levels:  C2-C3: Broad-based posterior disc bulge flattens and effaces the ventral thecal sac. Moderate right worse than left facet and ligament flavum hypertrophy. Small joint effusion on the left. Resultant moderate spinal stenosis. Mild to moderate left worse than right C3 foraminal narrowing. C3-C4: Anterolisthesis. Diffuse disc bulge with bilateral uncovertebral hypertrophy. Advanced bilateral facet degeneration, worse on the right. 13 mm cystic lesion along the left ligamentum flavum could reflect cystic ligamentous degeneration versus a synovial cyst (series 13, image 12). Resultant moderate to severe spinal stenosis with severe bilateral C4 foraminal narrowing. C4-C5: Trace anterolisthesis. Mild disc bulge with uncovertebral hypertrophy. Moderate right worse than left facet degeneration. Flattening of the ventral thecal sac without significant spinal stenosis. Severe left worse than right C5 foraminal stenosis. C5-C6: Trace anterolisthesis. Degenerative intervertebral disc space narrowing with diffuse disc bulge and bilateral uncovertebral spurring, greater on the right. Flattening of the ventral thecal sac without significant spinal stenosis. Severe right with moderate left C6 foraminal narrowing. C6-C7: Degenerative intervertebral disc space narrowing with diffuse disc osteophyte complex. Flattening of the ventral thecal sac without significant spinal stenosis. Mild bilateral C7 foraminal narrowing. C7-T1: Degenerative intervertebral disc space narrowing with diffuse disc bulge. Right greater than left uncovertebral spurring. No spinal stenosis. Mild right C8 foraminal narrowing. Left neural foramina remains patent. IMPRESSION: 1. No acute abnormality within the cervical spine. 2. Moderate to severe spinal stenosis at C2-3 and C3-4 related to disc bulging and facet disease. No cord signal changes to suggest myelopathy. 3. Multifactorial degenerative changes with resultant severe bilateral C4 and C5 foraminal  narrowing, with severe right and moderate left C6 foraminal stenosis. Electronically Signed   By: Jeannine Boga M.D.   On: 08/26/2020 00:20   MR THORACIC SPINE WO CONTRAST  Result Date: 08/26/2020 CLINICAL DATA:  Initial evaluation for myelopathy, acute or progressive. EXAM: MRI THORACIC SPINE WITHOUT CONTRAST TECHNIQUE: Multiplanar, multisequence MR imaging of the thoracic spine was performed. No intravenous contrast was administered. COMPARISON:  CT from 07/15/2020. FINDINGS: Alignment: Examination somewhat technically limited by motion artifact. Additionally,  the dens is not visualized on counter sequence. First rib-bearing vertebral body is labeled T1, with same numbering system employed as on prior CT. Straightening of the normal thoracic kyphosis. Trace retrolisthesis of L1 on L2. Vertebrae: Acute appearing compression fracture seen involving the superior endplate of 624THL. Associated mild 15-20% height loss has mildly progressed from prior CT. No significant bony retropulsion. Adjacent chronic compression deformity of T12 with up to approximately 50% height loss and 3 mm bony retropulsion is stable. Otherwise, vertebral body height maintained with no other acute fracture. Underlying bone marrow signal intensity within normal limits. Benign hemangioma noted within the T4 vertebral body. No worrisome osseous lesions. No other abnormal marrow edema. Cord: Signal intensity within the thoracic spinal cord is within normal limits. No convincing cord signal abnormality seen on this motion degraded exam. Conus terminates near the level of L1. Paraspinal and other soft tissues: Mild edema noted within the subcutaneous soft tissues of the visualized lower back. Paraspinous soft tissues demonstrate no other acute abnormality. Disc levels: Normal expected for age multilevel disc desiccation seen throughout the thoracic spine. No significant disc bulge or focal disc herniation. Moderate posterior element  hypertrophy present at T10-11 with resultant mild spinal stenosis. 3 mm bony retropulsion related to the chronic T12 fracture without significant stenosis. No other stenosis or neural impingement noted within the thoracic spine. Few scattered benign perineural cysts noted about the neural foramina, most pronounced at T10-11 on the right. IMPRESSION: 1. Acute compression fracture involving the superior endplate of 624THL with mild 15-20% height loss but no significant bony retropulsion. Overall, this is slightly progressed as compared to prior CT from 07/15/2020. 2. Chronic compression deformity of T12 with up to 50% height loss and 3 mm bony retropulsion, stable. 3. Moderate posterior element hypertrophy at T10-11 with resultant mild spinal stenosis. 4. No other acute abnormality within the thoracic spine. No other significant stenosis. No cord signal abnormality to suggest myelopathy. Electronically Signed   By: Jeannine Boga M.D.   On: 08/26/2020 00:42   DG Chest Port 1 View  Result Date: 08/25/2020 CLINICAL DATA:  Questionable sepsis - evaluate for abnormality Fall 1 week ago.  Progressive weakness since the fall. EXAM: PORTABLE CHEST 1 VIEW COMPARISON:  05/28/2020, chest CT 03/02/2020 FINDINGS: Stable cardiomegaly. Unchanged mediastinal contours with aortic atherosclerosis and tortuosity. There is emphysema with chronic bronchial thickening. No acute airspace disease. No pleural effusion or pneumothorax. Surgical hardware in the right proximal humerus. The bones are diffusely under mineralized. No acute osseous abnormalities are seen. Left upper rib fractures are chronic based on prior CT. IMPRESSION: 1. No acute chest findings. 2. Stable cardiomegaly and aortic tortuosity. Aortic Atherosclerosis (ICD10-I70.0). 3. Emphysema with chronic bronchial thickening. Electronically Signed   By: Keith Rake M.D.   On: 08/25/2020 19:58   DG C-Arm 1-60 Min  Result Date: 08/28/2020 CLINICAL DATA:  Surgery.  EXAM: DG C-ARM 1-60 MIN; CERVICAL SPINE - 2-3 VIEW FLUOROSCOPY TIME:  Fluoroscopy Time:  12 seconds. Number of Acquired Spot Images: 1 COMPARISON:  None. FINDINGS: A single C-arm fluoroscopic image was obtained intraoperatively and submitted for post operative interpretation. This image demonstrate bilateral lateral mass screws at C2 and C4. Retractors and surgical devices project posteriorly in the posterior paraspinal soft tissues. Please see the performing provider's procedural report for further detail. IMPRESSION: Intraoperative fluoroscopy, as detailed above. Electronically Signed   By: Margaretha Sheffield M.D.   On: 08/28/2020 18:36   VAS [place] LOWER EXTREMITY VENOUS (DVT)  Result Date:  09/03/2020  Lower Venous DVT Study Patient Name:  LINDLEY LANZI [facility]  Date of Exam:   09/03/2020 Medical Rec #: [id number]         Accession #:    [id number] Date of Birth: July 13, 1945         Patient Gender: F Patient Age:   23 years Exam Location:  [facility] Procedure:      VAS [place] LOWER EXTREMITY VENOUS (DVT) Referring Phys: Lauraine Rinne --------------------------------------------------------------------------------  Indications: Swelling.  Comparison Study: No prior study Performing Technologist: Maudry Mayhew MHA, RDMS, RVT, RDCS  Examination Guidelines: A complete evaluation includes B-mode imaging, spectral Doppler, color Doppler, and power Doppler as needed of all accessible portions of each vessel. Bilateral testing is considered an integral part of a complete examination. Limited examinations for reoccurring indications may be performed as noted. The reflux portion of the exam is performed with the patient in reverse Trendelenburg.  +---------+---------------+---------+-----------+----------+--------------+ RIGHT    CompressibilityPhasicitySpontaneityPropertiesThrombus Aging +---------+---------------+---------+-----------+----------+--------------+ CFV      Full           Yes      Yes                                  +---------+---------------+---------+-----------+----------+--------------+ SFJ      Full                                                        +---------+---------------+---------+-----------+----------+--------------+ FV Prox  Full                                                        +---------+---------------+---------+-----------+----------+--------------+ FV Mid   Full                                                        +---------+---------------+---------+-----------+----------+--------------+ FV DistalFull                                                        +---------+---------------+---------+-----------+----------+--------------+ PFV      Full                                                        +---------+---------------+---------+-----------+----------+--------------+ POP      Full           Yes      Yes                                 +---------+---------------+---------+-----------+----------+--------------+ PTV      Full                                                        +---------+---------------+---------+-----------+----------+--------------+  PERO     Full                                                        +---------+---------------+---------+-----------+----------+--------------+   +---------+---------------+---------+-----------+----------+--------------+ LEFT     CompressibilityPhasicitySpontaneityPropertiesThrombus Aging +---------+---------------+---------+-----------+----------+--------------+ CFV      Full           Yes      Yes                                 +---------+---------------+---------+-----------+----------+--------------+ SFJ      Full                                                        +---------+---------------+---------+-----------+----------+--------------+ FV Prox  Full                                                         +---------+---------------+---------+-----------+----------+--------------+ FV Mid   Full                                                        +---------+---------------+---------+-----------+----------+--------------+ FV DistalFull                                                        +---------+---------------+---------+-----------+----------+--------------+ PFV      Full                                                        +---------+---------------+---------+-----------+----------+--------------+ POP      Full           Yes      Yes                                 +---------+---------------+---------+-----------+----------+--------------+ PTV      Full                                                        +---------+---------------+---------+-----------+----------+--------------+ PERO     Full                                                        +---------+---------------+---------+-----------+----------+--------------+  Summary: RIGHT: - There is no evidence of deep vein thrombosis in the lower extremity.  - No cystic structure found in the popliteal fossa.  LEFT: - There is no evidence of deep vein thrombosis in the lower extremity.  - No cystic structure found in the popliteal fossa.  *See table(s) above for measurements and observations. Electronically signed by Servando Snare MD on 09/03/2020 at 4:46:26 PM.    Final     Labs:  Basic Metabolic Panel: No results for input(s): NA, K, CL, CO2, GLUCOSE, BUN, CREATININE, CALCIUM, MG, PHOS in the last 168 hours.  CBC: No results for input(s): WBC, NEUTROABS, HGB, HCT, MCV, PLT in the last 168 hours.  CBG: No results for input(s): GLUCAP in the last 168 hours.  Family history.  Mother and father with hypertension.  Maternal grandfather cancer type unknown.  Denies any esophageal rectal or colon cancer  Brief HPI:   [name] is a 75 y.o. right-handed female with history of anxiety/depression,  bipolar disorder diastolic congestive heart failure COPD quit smoking 10 years ago alcohol use history of left renal mass partial nephrectomy 2014 as well as radiation therapy, CAD with stenting maintained on low-dose aspirin, chronic back pain.  Per chart review lives with a roommate.  Used a Radiation protection practitioner for home and community ambulation.  Roommate helps with some cooking and supervises bathing.  Presented 08/28/2020 after a fall with progressive weakness in bilateral upper extremities and gait abnormality.  She was noted to have severe numbness in bilateral arms and hands right greater than left.  Admission chemistries unremarkable except glucose 105 hemoglobin 15.3 urine culture 60,000 E. coli/Aerococcus.  Cranial CT scan unremarkable for fracture.  X-rays and imaging revealed severe spondylitic myelopathy with severe stenosis C2-3 3-4 central cord syndrome.  Underwent posterior cervical arthrodesis with lateral mass instrumentation posterior cervical bilateral laminectomy on 08/28/2020 per Dr. Reatha Armour.  Cervical collar as directed can remove when eating and when in bed.  She was cleared to begin subcutaneous heparin for DVT prophylaxis.  Tolerating a regular diet.  Therapy evaluations completed due to patient decreased functional mobility was admitted for a comprehensive rehab program.   Hospital Course: ANNLEE WOODCOCK was admitted to rehab 09/02/2020 for inpatient therapies to consist of PT, ST and OT at least three hours five days a week. Past admission physiatrist, therapy team and rehab RN have worked together to provide customized collaborative inpatient rehab.  Pertaining to patient's severe spondylitic myelopathy severe stenosis/central cord syndrome.  Status post cervical arthrodesis posterior cervical bilateral laminectomy 08/28/2020.  Cervical collar as directed.  Patient would follow-up neurosurgery.  Surgical site clean and dry.  Subcutaneous heparin for DVT prophylaxis changed to Lovenox.  Venous  Doppler studies negative.  Pain management with the use of scheduled OxyContin as well as oxycodone for breakthrough pain.  Zanaflex added scheduled for spasms.  Blood pressure controlled on Norvasc as well as hydralazine with Avapro.  She would need outpatient follow-up.  History of diastolic congestive heart failure remained on Lasix no signs of fluid overload.  Patient with history of anxiety/depression bipolar disorder maintained on Zoloft/Abilify as well as Lamictal with emotional support provided.  History of COPD remote tobacco use inhalers as advised monitoring of oxygen saturations when up with therapies.  History of CAD with stenting or low-dose aspirin at been resumed no chest pain noted.  Neurogenic bowel bladder bowel program and education provided.  INO caths as needed to keep volumes being 300-500 cc the addition of Flomax  was added with improvement in emptying of her bladder.   Blood pressures were monitored on TID basis and soft and monitored   Rehab course: During patient's stay in rehab weekly team conferences were held to monitor patient's progress, set goals and discuss barriers to discharge. At admission, patient required minimal guard 15 feet rolling walker moderate assist sit to side-lying max is lower body bathing max is upper body dressing  Physical exam.  Blood pressure 117/50 pulse 68 temperature 97.8 respirations 18 oxygen saturation 94% room air Constitutional.  No acute distress HEENT Head.  Normocephalic and atraumatic Eyes.  Pupils round and reactive to light no discharge without nystagmus Neck.  Supple nontender no JVD without thyromegaly Cardiac regular rate rhythm any extra sounds or murmur heard Abdomen.  Soft nontender positive bowel sounds without rebound Respiratory effort normal no respiratory distress without wheeze Musculoskeletal.  +1 lower extremity edema.  No tenderness in extremities Skin.  Warm and dry incision clean dry and intact Neurologic.  Alert  oriented x4 Neuro.  4 - 4+ throughout  He/She  has had improvement in activity tolerance, balance, postural control as well as ability to compensate for deficits. He/She has had improvement in functional use RUE/LUE  and RLE/LLE as well as improvement in awareness.  Bed mobility supervision stand pivot transfers wheelchair to bed contact-guard.  Ambulates 180 feet rolling walker contact-guard assist.  ADLs sit to stand and stand pivot transfers contact-guard.  She did require some rest breaks working with endurance.  She was able to gather belongings for ADLs.  Full teaching completed plan discharged to home       Disposition: Discharged to home    Diet: Regular  Special Instructions: No driving smoking or alcohol  Cervical collar when out of bed  Medications at discharge 1.  Tylenol as needed 2.  Norvasc 5 mg p.o. daily 3.  Lipitor 20 mg p.o. daily 4.  Abilify 10 mg p.o. daily 5.  Aspirin 81 mg p.o. daily 6.  Vitamin D 2000 units p.o. daily 7.  Colace 100 mg daily as needed mild constipation 8.  Ferrous sulfate 325 mg p.o. daily 9.  Breo Ellipta 1 puff daily 10.  Lasix 20 mg p.o. daily 11.  Hydralazine 25 mg p.o. BID 12.  Avapro 300 mg p.o. daily 13.  Lamictal 200 mg p.o. twice daily 14.  Linzess 145 mcg p.o. daily before breakfast 15.  Singulair 10 mg p.o. daily 16.  Nitroglycerin as needed 17.  Oxycodone 10 mg p.o. every 4 hours as needed pain 18.  OxyContin sustained-release 10 mg every 12 hours x1 week 19.  Protonix 40 mg p.o. daily 20.  Klor-Con 10 mEq p.o. daily 21.  Zoloft 50 mg p.o. daily 22.  Flomax 0.4 mg p.o. daily 23.  Zanaflex 4 mg p.o. every 6 hours   30-35 minutes were spent completing discharge summary and discharge planning  Discharge Instructions     Ambulatory referral to Physical Medicine Rehab   Complete by: As directed    Moderate complexity follow-up 1 to 2 weeks central cord syndrome        Follow-up Information     Lovorn, Jinny Blossom, MD  Follow up.   Specialty: Physical Medicine and Rehabilitation Why: Office to call for appointment Contact information: [id number] [address] [phone]         Dawley, Pieter Partridge C, DO Follow up.   Why: Call for appointment Contact information: [address]  [place] [address] [phone]                 Signed: Lavon Paganini [place] 09/18/2020, 5:18 AM

## 2020-09-16 NOTE — Progress Notes (Signed)
Occupational Therapy Session Note  Patient Details  Name: [name] MRN: [id number] Date of Birth: June 21, 1945  Today's Date: 09/16/2020 OT Individual Time: 1093-2355 OT Individual Time Calculation (min): 27 min    Short Term Goals: Week 1:  OT Short Term Goal 1 (Week 1): patient will complete bed mobility and SPT with mod A OT Short Term Goal 1 - Progress (Week 1): Met OT Short Term Goal 2 (Week 1): patient will complete eating, grooming and upper body adl with mod A using AD/built up handles OT Short Term Goal 2 - Progress (Week 1): Met OT Short Term Goal 3 (Week 1): patient will tolerate unsupported sitting with min A OT Short Term Goal 3 - Progress (Week 1): Met OT Short Term Goal 4 (Week 1): patient will complete toileting max A of one OT Short Term Goal 4 - Progress (Week 1): Met Week 2:  OT Short Term Goal 1 (Week 2): STG=LTG 2/2 ELOS (continue towards supervision/min A goals)   Skilled Therapeutic Interventions/Progress Updates:    Pt greeted at time of session semireclined in bed resting agreeable to try to attempt OT session but c/o "bad" neck pain, which has been onoing, and did not give # rating. Pt agreeable to try moist heat and STM to upper back area in attempt to have relief. Pt tolerated moist heat pack for 10 mins with skin in tact pre and post, applied in supine with HOB elevated and pt remained in this position. STM for upper trap/rhomboid area that therapist able to reach in this position as pt did not feel able to sit EOB. Pt reports improvement with moist heat but not STM. Left in bed reclined alarm on call bell in reach.  Therapy Documentation Precautions:  Precautions Precautions: Cervical, Fall Precaution Comments: watch O2 sats Required Braces or Orthoses: Cervical Brace Cervical Brace: Hard collar, At all times Restrictions Weight Bearing Restrictions: No     Therapy/Group: Individual Therapy  Viona Gilmore 09/16/2020, 7:20 AM

## 2020-09-16 NOTE — Progress Notes (Signed)
Physical Therapy Session Note  Patient Details  Name: [name] MRN: [id number] Date of Birth: 02-04-1945  Today's Date: 09/16/2020 PT Individual Time: 1300-1415 PT Individual Time Calculation (min): 75 min   Short Term Goals: Week 1:  PT Short Term Goal 1 (Week 1): Pt will perform bed mobility consistently with modA. PT Short Term Goal 1 - Progress (Week 1): Met PT Short Term Goal 2 (Week 1): Pt will peform bed to chair transfer with minA. PT Short Term Goal 2 - Progress (Week 1): Met PT Short Term Goal 3 (Week 1): Pt will ambulate x50' with modA +1 and LRAD. PT Short Term Goal 3 - Progress (Week 1): Met Week 2:  PT Short Term Goal 1 (Week 2): STG = LTG due to LOS  Skilled Therapeutic Interventions/Progress Updates:    pt received in bed and agreeable to therapy. Pt reports 9/10 pain in her neck and back. Medication administered by nsg at start of session. Pt reported pain down to 8/10 by end of session.   Bed mobility with supervision, supine<>sit. Scooting toward [facility] with cues for technique and use of bed features. Stand pivot transfer and ambulatory transfer with RW and CGA throughout session.   Pt transported to therapy gym for time management and energy conservation. Pt ambulated 4 x 150 ft with RW and CGA for LE and cardiovascular endurance. Pt demoes kyphotic posture, crouched gait pattern, and hip ER during gait.   Pt took extended seated rest break in safe place while therapist responded to code STARR alert. Therapist performed STM and scar mobilization to R knee d/t reports of pain and weakness during gait. Pt reports mild improvement in knee pain, but requests to return to bed 2/2 back pain in w/c.   Pt returned to bed as documented above and performed SAQ and heel slides for LE strength and ROM. Pt was left with all needs in reach and alarm active.   Therapy Documentation Precautions:  Precautions Precautions: Cervical, Fall Precaution Comments: watch O2  sats Required Braces or Orthoses: Cervical Brace Cervical Brace: Hard collar, At all times Restrictions Weight Bearing Restrictions: No     Therapy/Group: Individual Therapy  Mickel Fuchs 09/16/2020, 4:21 PM

## 2020-09-16 NOTE — Progress Notes (Signed)
Occupational Therapy Session Note  Patient Details  Name: [name] MRN: [id number] Date of Birth: Sep 19, 1945  Today's Date: 09/16/2020 OT Individual Time: [id number] OT Individual Time Calculation (min): 70 min    Short Term Goals: Week 2:  OT Short Term Goal 1 (Week 2): STG=LTG 2/2 ELOS (continue towards supervision/min A goals)  Skilled Therapeutic Interventions/Progress Updates:    Pt resting in bed upon arrival and requesting to change clothing. Supine>sit EOB with supervision. Sitting balance with supervision. Pt required max A to doff shirt and min A to don pull over shirt. Pt used reacher to thread pants over Bil feet. CGA for standing balance to pull pants over hips. Pt in considerable pain with unsupported sitting. Pt prefers to sit in w/c but only for short periods of time. Discussed home safety and continued with discharge planning. Sit<>stand and standing balance with CGA using RW. Pt able to maintain standing without BUE support. Pt requested to return to bed since she had a lengthy gap before her next therapy session. Sit>supine and repositioned in bed without assistance. Pt requires multiple rest breaks during session 2/2 fatigue and pain. Pt remained in bed with all needs within reach and bed alarm activated.  Therapy Documentation Precautions:  Precautions Precautions: Cervical, Fall Precaution Comments: watch O2 sats Required Braces or Orthoses: Cervical Brace Cervical Brace: Hard collar, At all times Restrictions Weight Bearing Restrictions: No  Pain: Pain Assessment Pain Scale: 0-10 Pain Score: 8  Pain Type: Acute pain Pain Location: Back Pain Orientation: Lower Pain Descriptors / Indicators: Aching Pain Frequency: Constant Pain Onset: On-going Patients Stated Pain Goal: 1 Pain Intervention(s): Meds admin by RN during session; repositioned     Therapy/Group: Individual Therapy  Leroy Libman 09/16/2020, 9:31 AM

## 2020-09-17 ENCOUNTER — Other Ambulatory Visit (HOSPITAL_COMMUNITY): Payer: Self-pay

## 2020-09-17 MED ORDER — CALCIUM CARB-CHOLECALCIFEROL 600-400 MG-UNIT PO TABS
1.0000 | ORAL_TABLET | Freq: Every day | ORAL | 0 refills | Status: DC
  Filled 2020-09-17: qty 60, 60d supply, fill #0
  Filled 2020-09-17: qty 30, 30d supply, fill #0

## 2020-09-17 MED ORDER — POTASSIUM CHLORIDE ER 8 MEQ PO TBCR
8.0000 meq | EXTENDED_RELEASE_TABLET | Freq: Every day | ORAL | 0 refills | Status: DC
  Filled 2020-09-17: qty 30, 30d supply, fill #0

## 2020-09-17 MED ORDER — OXYCODONE HCL ER 10 MG PO T12A
10.0000 mg | EXTENDED_RELEASE_TABLET | Freq: Two times a day (BID) | ORAL | 0 refills | Status: DC
  Filled 2020-09-17 (×2): qty 14, 7d supply, fill #0

## 2020-09-17 MED ORDER — LINACLOTIDE 145 MCG PO CAPS
145.0000 ug | ORAL_CAPSULE | Freq: Every day | ORAL | 0 refills | Status: DC
  Filled 2020-09-17 (×2): qty 30, 30d supply, fill #0

## 2020-09-17 MED ORDER — ARIPIPRAZOLE 10 MG PO TABS
10.0000 mg | ORAL_TABLET | Freq: Every day | ORAL | 0 refills | Status: AC
  Filled 2020-09-17 (×2): qty 30, 30d supply, fill #0

## 2020-09-17 MED ORDER — LAMOTRIGINE 100 MG PO TABS
200.0000 mg | ORAL_TABLET | Freq: Two times a day (BID) | ORAL | 0 refills | Status: AC
  Filled 2020-09-17: qty 120, 30d supply, fill #0
  Filled 2020-09-17: qty 60, 15d supply, fill #0

## 2020-09-17 MED ORDER — AMLODIPINE BESYLATE 5 MG PO TABS
5.0000 mg | ORAL_TABLET | Freq: Every day | ORAL | 0 refills | Status: DC
  Filled 2020-09-17 (×2): qty 30, 30d supply, fill #0

## 2020-09-17 MED ORDER — VALSARTAN 160 MG PO TABS
320.0000 mg | ORAL_TABLET | Freq: Every day | ORAL | 2 refills | Status: DC
  Filled 2020-09-17: qty 90, 45d supply, fill #0
  Filled 2020-09-17: qty 60, 30d supply, fill #0

## 2020-09-17 MED ORDER — ATORVASTATIN CALCIUM 20 MG PO TABS
20.0000 mg | ORAL_TABLET | Freq: Every day | ORAL | 0 refills | Status: DC
  Filled 2020-09-17 (×2): qty 30, 30d supply, fill #0

## 2020-09-17 MED ORDER — DEXLANSOPRAZOLE 60 MG PO CPDR
60.0000 mg | DELAYED_RELEASE_CAPSULE | Freq: Every day | ORAL | 0 refills | Status: DC
  Filled 2020-09-17 (×2): qty 30, 30d supply, fill #0

## 2020-09-17 MED ORDER — ACETAMINOPHEN 325 MG PO TABS: 650.0000 mg | ORAL_TABLET | ORAL | Status: AC | PRN

## 2020-09-17 MED ORDER — TIZANIDINE HCL 4 MG PO TABS
4.0000 mg | ORAL_TABLET | Freq: Four times a day (QID) | ORAL | 0 refills | Status: DC
Start: 2020-09-17 — End: 2021-07-17
  Filled 2020-09-17 (×2): qty 120, 30d supply, fill #0

## 2020-09-17 MED ORDER — OXYCODONE HCL 10 MG PO TABS
10.0000 mg | ORAL_TABLET | ORAL | 0 refills | Status: AC | PRN
  Filled 2020-09-17 (×2): qty 30, 5d supply, fill #0

## 2020-09-17 MED ORDER — ALBUTEROL SULFATE HFA 108 (90 BASE) MCG/ACT IN AERS
2.0000 | INHALATION_SPRAY | Freq: Four times a day (QID) | RESPIRATORY_TRACT | 1 refills | Status: AC | PRN
  Filled 2020-09-17: qty 18, 25d supply, fill #0
  Filled 2020-09-17: qty 6.7, 25d supply, fill #0

## 2020-09-17 MED ORDER — TAMSULOSIN HCL 0.4 MG PO CAPS
0.4000 mg | ORAL_CAPSULE | Freq: Every day | ORAL | 0 refills | Status: DC
Start: 2020-09-17 — End: 2021-02-09
  Filled 2020-09-17 (×2): qty 30, 30d supply, fill #0

## 2020-09-17 MED ORDER — BREZTRI AEROSPHERE 160-9-4.8 MCG/ACT IN AERO
2.0000 | INHALATION_SPRAY | Freq: Two times a day (BID) | RESPIRATORY_TRACT | 5 refills | Status: DC
  Filled 2020-09-17: qty 10.7, fill #0
  Filled 2020-09-17: qty 5.9, 7d supply, fill #0

## 2020-09-17 MED ORDER — HYDRALAZINE HCL 25 MG PO TABS
25.0000 mg | ORAL_TABLET | Freq: Three times a day (TID) | ORAL | 0 refills | Status: DC
  Filled 2020-09-17 (×2): qty 90, 30d supply, fill #0

## 2020-09-17 MED ORDER — FUROSEMIDE 20 MG PO TABS
20.0000 mg | ORAL_TABLET | Freq: Every day | ORAL | 3 refills | Status: DC
  Filled 2020-09-17 (×2): qty 30, 30d supply, fill #0

## 2020-09-17 MED ORDER — FERROUS SULFATE 325 (65 FE) MG PO TABS
325.0000 mg | ORAL_TABLET | Freq: Every day | ORAL | 3 refills | Status: DC
  Filled 2020-09-17 (×2): qty 30, 30d supply, fill #0

## 2020-09-17 MED ORDER — SERTRALINE HCL 50 MG PO TABS
50.0000 mg | ORAL_TABLET | Freq: Every day | ORAL | 0 refills | Status: DC
  Filled 2020-09-17 (×2): qty 30, 30d supply, fill #0

## 2020-09-17 MED ORDER — VITAMIN D3 50 MCG (2000 UT) PO TABS
2000.0000 [IU] | ORAL_TABLET | Freq: Every day | ORAL | 0 refills | Status: DC
  Filled 2020-09-17 (×2): qty 30, 30d supply, fill #0

## 2020-09-17 NOTE — Progress Notes (Signed)
PROGRESS NOTE   Subjective/Complaints: In shower- rates pain 7/10 after oxycodone and also took OxyContin <30 minutes prior.  ROS:   Pt denies SOB, abd pain, CP, N/V/C/D, and vision changes   Objective:   No results found. No results for input(s): WBC, HGB, HCT, PLT in the last 72 hours.  No results for input(s): NA, K, CL, CO2, GLUCOSE, BUN, CREATININE, CALCIUM in the last 72 hours.   Intake/Output Summary (Last 24 hours) at 09/17/2020 1046 Last data filed at 09/17/2020 0810 Gross per 24 hour  Intake 600 ml  Output --  Net 600 ml     Pressure Injury 09/11/20 Buttocks Left Stage 2 -  Partial thickness loss of dermis presenting as a shallow open injury with a red, pink wound bed without slough. non-blanchable area with very thin dry skin flakes covering the center (Active)  09/11/20 1441  Location: Buttocks  Location Orientation: Left  Staging: Stage 2 -  Partial thickness loss of dermis presenting as a shallow open injury with a red, pink wound bed without slough.  Wound Description (Comments): non-blanchable area with very thin dry skin flakes covering the center  Present on Admission:      Pressure Injury 09/11/20 Buttocks Right Stage 2 -  Partial thickness loss of dermis presenting as a shallow open injury with a red, pink wound bed without slough. non-blanchable area with thin layer skin flakes covering the center (Active)  09/11/20 1445  Location: Buttocks  Location Orientation: Right  Staging: Stage 2 -  Partial thickness loss of dermis presenting as a shallow open injury with a red, pink wound bed without slough.  Wound Description (Comments): non-blanchable area with thin layer skin flakes covering the center  Present on Admission:     Physical Exam: Vital Signs Blood pressure (!) 114/55, pulse (!) 55, temperature 97.8 F (36.6 C), temperature source Oral, resp. rate 14, height '5\' 3"'$  (1.6 m), weight 77.1 kg,  SpO2 97 %.     General: awake, alert, appropriate, in shower with OT- not on yet;  NAD HENT: conjugate gaze; oropharynx moist; cervical collar in place;  CV: regular rate; no JVD Pulmonary: CTA B/L; no W/R/R- good air movement- O2 via [place] 2L GI: soft, NT, ND, (+)BS Psychiatric: appropriate Neurological: Ox3  Skin: cervical wound cdi with dry dressing. - . A few scattered lacs and abrasions on legs/arms, bruises remain.  Neuro:  fairly alert, oriented. Follows commands. Able to carry conversation. No CN findings. RUE 4+/5. LUE 4, decreased sensation to LT/pain LUE>LUE. LE4- prox to 4/5 distally. No sensory findings in legs. DTR's 3+ Musculoskeletal: cervicalgia ongoing in collar.  Tight trigger points in neck/shoulders/upper back - still very tight- and cries out with palpation of neck.    Assessment/Plan: 1. Functional deficits which require 3+ hours per day of interdisciplinary therapy in a comprehensive inpatient rehab setting. Physiatrist is providing close team supervision and 24 hour management of active medical problems listed below. Physiatrist and rehab team continue to assess barriers to discharge/monitor patient progress toward functional and medical goals  Care Tool:  Bathing    Body parts bathed by patient: Right arm, Left arm, Chest, Abdomen, Front perineal area,  Right upper leg, Left upper leg, Face   Body parts bathed by helper: Buttocks, Right lower leg, Left lower leg     Bathing assist Assist Level: Moderate Assistance - Patient 50 - 74%     Upper Body Dressing/Undressing Upper body dressing   What is the patient wearing?: Pull over shirt    Upper body assist Assist Level: Minimal Assistance - Patient > 75%    Lower Body Dressing/Undressing Lower body dressing      What is the patient wearing?: Pants     Lower body assist Assist for lower body dressing: Minimal Assistance - Patient > 75%     Toileting Toileting    Toileting assist Assist for  toileting: Moderate Assistance - Patient 50 - 74%     Transfers Chair/bed transfer  Transfers assist     Chair/bed transfer assist level: Supervision/Verbal cueing     Locomotion Ambulation   Ambulation assist      Assist level: Contact Guard/Touching assist Assistive device: Walker-rolling Max distance: 200'   Walk 10 feet activity   Assist     Assist level: Contact Guard/Touching assist Assistive device: Walker-rolling   Walk 50 feet activity   Assist Walk 50 feet with 2 turns activity did not occur: Safety/medical concerns  Assist level: Contact Guard/Touching assist Assistive device: Walker-rolling    Walk 150 feet activity   Assist Walk 150 feet activity did not occur: Safety/medical concerns  Assist level: Contact Guard/Touching assist Assistive device: Walker-rolling    Walk 10 feet on uneven surface  activity   Assist Walk 10 feet on uneven surfaces activity did not occur: Safety/medical concerns   Assist level: Contact Guard/Touching assist Assistive device: Walker-rolling   Wheelchair     Assist Is the patient using a wheelchair?: No             Wheelchair 50 feet with 2 turns activity    Assist            Wheelchair 150 feet activity     Assist          Blood pressure (!) 114/55, pulse (!) 55, temperature 97.8 F (36.6 C), temperature source Oral, resp. rate 14, height '5\' 3"'$  (1.6 m), weight 77.1 kg, SpO2 97 %.     Medical Problem List and Plan: 1.  Gait abnormality secondary to severe spondylitic myelopathy with severe stenosis C2-3 3-4/central cord syndrome.  Status post cervical arthrodesis with posterior cervical bilateral laminectomy 08/28/2020.  Cervical collar as directed             -patient may shower             -ELOS/Goals: Supervision/Min A 17-21 days  -fitted with smaller [place] J collar which fits much better.    8/24- d/c date 9/2- con't PT and OT- CIR  Con't PT and OT- 9/2 is d/c date?  Team conference today  9/1- con't PT and OT_ d/c tomorrow- shower today 2.  Antithrombotics: -DVT/anticoagulation: Subcutaneous heparin.  Check vascular study Mechanical: Sequential compression devices, below knee Bilateral lower extremities Pharmaceutical: sq hep  8/25- change to Lovneox since Can take/Cr OK  8/27- tolerating better  9/1- will d/w pt if wants to go home on Lovenox- walking 150 ft, but is SCI pt- pt refuses to take home and use at home, regardless of research.              -antiplatelet therapy: N/A 3. Pain Management: Oxycodone as needed  Monitor with increased exertion  -dc'ed robaxin d/t sedation.   -continue tizanidine trial  8/23- will increase Oxycodone (get rid of percocet to Oxy '10mg'$ ) to q4 hours prn to help keep pain more stable. Trigger point injections today  8/25- will add Oxycontin 10 mg BID since having so much pain- seems miserable, even with Oxycodone- cannot take Morphine- causes Hallucinations. 8/27- tolerating OxyContin well and pain down to "7/10"  8/30- will change zanaflex to q6 hours scheduled.  9/1- will con't Regimen-  pain stable- at 7/10 when meds are working- wil send home on regimen.  4. Mood: Zoloft 50 mg daily, Lamictal 200 mg twice daily             -antipsychotic agents: Abilify 10 mg daily   8/23- mood OK per pt- con't regimen for now 5. Neuropsych: This patient is?  Fully capable of making decisions on her own behalf. 6. Skin/Wound Care: Routine skin checks 7. Fluids/Electrolytes/Nutrition: Routine in and outs             CMP reviewed. Add protein supp for low albumin 8.  Hypertension.  Hydralazine 25 mg twice daily, Avapro 300 mg daily, Norvasc 10 mg daily             borderline. Tizanidine helping too Vitals:   09/17/20 0417 09/17/20 0834  BP: 137/66 (!) 114/55  Pulse: (!) 55   Resp: 14   Temp: 97.8 F (36.6 C)   SpO2: 97%     8/27- BP slightly elevated, but then low this AM- somewhat labile- if continues, might  update regimen- will monitor for trend.   9/1- BP doing better- con't regimen 9.  COPD/history of tobacco use.  Continue inhalers as directed.  Monitor oxygen saturations  -O2 [place] as needed  8/24- meets criteria per walk test to continue home O2  8/27- on 2L O2 at home- will continue 10.  GERD.  Protonix 11.  CAD with history of stenting.  Plan to discuss with neurosurgery when to resume low-dose aspirin 12.  Diastolic congestive heart failure.  Lasix 20 mg daily.  Monitor for any signs of fluid overload   Filed Weights   09/15/20 0500 09/16/20 0500 09/17/20 0500  Weight: 80.8 kg 78.4 kg 77.1 kg    -8/24- weight up x1 day, then back down- con't to monitor  8/29- weight stable- continue to monitor   -monitor daily 13. Neurogenic bowel: still no bm yesterday despite sorbitol and sse    8/19 mg citrate on backorder -repeat sorbitol and SSE later today if needed -added linzess 8/28- Sorbitol last night- 3 BM's- will wait on more sorbitol for now. 8/30- feels like needs to go- told her to not hold Linzess  14. Neurogenic bladder:  -continue voiding trial  -I/O cath prn to keep volumes between 300-500cc, patient is not emptying completely  8/24- will add Flomax to help pt void better/more completely.   8/25- now voiding more- will con't flomax  8/29- emptying with voiding- continue flomax  9/1- send home tomorrow on Flomax 0.8 mg qsupper     LOS: 15 days A FACE TO FACE EVALUATION WAS PERFORMED  Kermit Arnette 09/17/2020, 10:46 AM

## 2020-09-17 NOTE — Progress Notes (Addendum)
Physical Therapy Discharge Summary  Patient Details  Name: LENORE MOYANO MRN: [id number] Date of Birth: 07-04-1945  Today's Date: 09/17/2020   Patient has met 9 of 9 long term goals due to improved activity tolerance, improved balance, improved postural control, increased strength, increased range of motion, and improved coordination.  Patient to discharge at an ambulatory level Supervision.   Patient's care partner is independent to provide the necessary physical assistance at discharge.  Reasons goals not met: N/A all goals met  Recommendation:  Patient will benefit from ongoing skilled PT services in home health setting to continue to advance safe functional mobility, address ongoing impairments in endurance, LE strength, functional mobility, and minimize fall risk.  Equipment: Home oxygen  Reasons for discharge: treatment goals met  Patient/family agrees with progress made and goals achieved: Yes  PT Discharge Precautions/Restrictions Precautions Precautions: Cervical;Fall Precaution Comments: watch O2 sats Required Braces or Orthoses: Cervical Brace Cervical Brace: Hard collar Restrictions Weight Bearing Restrictions: No Vital Signs  Pain Pain Assessment Pain Scale: 0-10 Pain Score: 6  Pain Location: Neck Pain Orientation: Posterior Pain Intervention(s): Medication (See eMAR) Pain Interference   Vision/Perception  Perception Perception: Within Functional Limits Praxis Praxis: Intact  Cognition Arousal/Alertness: Awake/alert Sensation   Motor  Motor Motor: Tetraplegia Motor - Discharge Observations: BUE improving motor planning  Mobility Bed Mobility Bed Mobility: Supine to Sit;Sit to Supine Supine to Sit: Supervision/Verbal cueing Sit to Supine: Supervision/Verbal cueing Transfers Transfers: Sit to Stand;Stand Pivot Transfers;Stand to Sit Sit to Stand: Supervision/Verbal cueing Stand to Sit: Supervision/Verbal cueing Stand Pivot Transfers:  Supervision/Verbal cueing Transfer (Assistive device): Rolling walker Locomotion  Gait Ambulation: Yes Gait Assistance: Supervision/Verbal cueing Gait Distance (Feet): 150 Feet Assistive device: Rolling walker Gait Assistance Details: Verbal cues for precautions/safety Gait Assistance Details: verbal cues for pacing Gait Gait: Yes Gait Pattern: Right flexed knee in stance;Left flexed knee in stance;Decreased stride length;Decreased step length - right;Decreased step length - left Gait velocity: intermittent cues for pacing Stairs / Additional Locomotion Stairs: Yes Stairs Assistance: Supervision/Verbal cueing Stair Management Technique: With walker Number of Stairs: 1 Height of Stairs: 5 Curb: Supervision/Verbal cueing Pick up small object from the floor assist level: Supervision/Verbal cueing (with reacher) Pick up small object from the floor assistive device: reacher Wheelchair Mobility Wheelchair Mobility: No  Trunk/Postural Assessment  Cervical Assessment Cervical Assessment: Exceptions to [facility] (forwrd head) Thoracic Assessment Thoracic Assessment: Exceptions to [facility] (kyphotic) Lumbar Assessment Lumbar Assessment: Exceptions to [facility] (posterior pelvic tilt) Postural Control Postural Control: Within Functional Limits  Balance Balance Balance Assessed: Yes Static Sitting Balance Static Sitting - Balance Support: Feet supported Static Sitting - Level of Assistance: 6: Modified independent (Device/Increase time) Dynamic Sitting Balance Dynamic Sitting - Balance Support: Feet supported Dynamic Sitting - Level of Assistance: 5: Stand by assistance Dynamic Sitting - Balance Activities: [place];Reaching across midline Static Standing Balance Static Standing - Balance Support: Bilateral upper extremity supported Static Standing - Level of Assistance: 5: Stand by assistance Dynamic Standing Balance Dynamic Standing - Balance Support: During functional activity;Bilateral upper  extremity supported Dynamic Standing - Level of Assistance: 5: Stand by assistance Extremity Assessment      RLE Assessment RLE Assessment: Within Functional Limits General Strength Comments: Grossly 4+/5, 4/5 hip flexors LLE Assessment LLE Assessment: Within Functional Limits General Strength Comments: Grossly 4+/5, 4/5 hip flexors    [name] 09/17/2020, 12:34 PM

## 2020-09-17 NOTE — Progress Notes (Signed)
Occupational Therapy Discharge Summary  Patient Details  Name: [name] MRN: [id number] Date of Birth: Nov 27, 1945   Patient has met 10 of 11 long term goals due to improved activity tolerance, improved balance, postural control, ability to compensate for deficits, and improved coordination.  Pt made steady progress with BADLs, functional transfers, BUE functional use, and activity tolerance during this admission. Pt requires mod A for bathing at shower level and seated EOB/w/c. Pt requires min A for UB/LB dressing tasks with sit<>stand to pull pants over hips. Functional transfers, including toilet and TTB, with close supervision. Toileting with supervision. PT fatigues easily and pt educated on energy conservations strategies. Pt's friend/roommate has been present for therapy and demonstrates appropriate supervision/assistance. Pt's friend has demonstrated changing pads on cervical collar. Pt and friend have verbalized understanding of recommendation for 24/7 assistance/supervision. Patient to discharge at overall Supervision - Min A level.  Patient's care partner is independent to provide the necessary physical assistance at discharge.    Reasons goals not met: Pt requires Mod A for LB bathing for assist to reach past knee level and thoroughly clean buttocks at times.   Recommendation:  Patient will benefit from ongoing skilled OT services in home health setting to continue to advance functional skills in the area of BADL and Reduce care partner burden.  Equipment: No equipment provided  Reasons for discharge: treatment goals met and discharge from hospital  Patient/family agrees with progress made and goals achieved: Yes  OT Discharge  Vision Baseline Vision/History: 1 Wears glasses Wears Glasses: At all times Patient Visual Report: Blurring of vision;Other (comment) (occasional) Perception  Perception: Within Functional Limits Praxis Praxis: Intact Cognition Overall  Cognitive Status: History of cognitive impairments - at baseline Arousal/Alertness: Awake/alert Orientation Level: Oriented X4 Immediate Memory Recall: Sock;Blue;Bed Memory Recall Sock: Without Cue Memory Recall Blue: Without Cue Memory Recall Bed: Without Cue Awareness: Appears intact Problem Solving: Appears intact Safety/Judgment: Appears intact Sensation Sensation Light Touch: Appears Intact Hot/Cold: Appears Intact Proprioception: Appears Intact Stereognosis: Not tested Coordination Gross Motor Movements are Fluid and Coordinated: Yes Fine Motor Movements are Fluid and Coordinated: Yes Motor  Motor Motor: Tetraplegia Motor - Discharge Observations: BUE improving motor planning Mobility  Bed Mobility Bed Mobility: Supine to Sit;Sit to Supine Supine to Sit: Supervision/Verbal cueing Sit to Supine: Supervision/Verbal cueing Transfers Sit to Stand: Supervision/Verbal cueing Stand to Sit: Supervision/Verbal cueing  Trunk/Postural Assessment  Cervical Assessment Cervical Assessment: Exceptions to [facility] (forward head) Thoracic Assessment Thoracic Assessment: Exceptions to [facility] (kyphotic) Lumbar Assessment Lumbar Assessment: Exceptions to [facility] (posterior pelvic tilt) Postural Control Postural Control: Within Functional Limits  Balance Balance Balance Assessed: Yes Static Sitting Balance Static Sitting - Balance Support: Feet supported Static Sitting - Level of Assistance: 6: Modified independent (Device/Increase time) Dynamic Sitting Balance Dynamic Sitting - Balance Support: Feet supported;During functional activity Dynamic Sitting - Level of Assistance: 5: Stand by assistance Dynamic Sitting - Balance Activities: [place];Reaching across midline Static Standing Balance Static Standing - Balance Support: Bilateral upper extremity supported Static Standing - Level of Assistance: 5: Stand by assistance Dynamic Standing Balance Dynamic Standing - Balance Support: During  functional activity;Bilateral upper extremity supported Dynamic Standing - Level of Assistance: 5: Stand by assistance Extremity/Trunk Assessment RUE Assessment Passive Range of Motion (PROM) Comments: shoulder flex/abd 90, ER WFL, distal WFL General Strength Comments: 4/5 overall LUE Assessment Passive Range of Motion (PROM) Comments: shoulder flex/abd 120, distal [facility] General Strength Comments: 4/5 overall   Leroy Libman 09/17/2020, 2:02 PM

## 2020-09-17 NOTE — Progress Notes (Signed)
   09/17/20 1402 09/17/20 1423 09/17/20 1431  Vitals  BP (!) 84/49 95/61 (!) 105/55  MAP (mmHg) (!) 61 72 71  BP Location Left Arm Right Arm Right Arm  BP Method Automatic Automatic Automatic  Patient Position (if appropriate) Lying Sitting Lying  Pulse Rate 62 61 67    PA notified of results.

## 2020-09-17 NOTE — Progress Notes (Signed)
Pt o2 sats 92% on 1L o2 nasal cannula. Pt states she only wears oxygen at home when she goes below 90%.

## 2020-09-17 NOTE — Progress Notes (Signed)
Inpatient Rehabilitation Care Coordinator Discharge Note   Patient Details  Name: [name] MRN: [id number] Date of Birth: [birth year]   Discharge location: D/c to home with her friend [name] who lives in the home and can provide supervision.  Length of Stay: 15 days  Discharge activity level: Supervision  Home/community participation: Limited  Patient response [id number] Literacy - How often do you need to have someone help you when you read instructions, pamphlets, or other written material from your doctor or pharmacy?: Rarely  Patient response [id number] Isolation - How often do you feel lonely or isolated from those around you?: Never  Services provided included: MD, RD, PT, OT, RN, TR, CM, Pharmacy, Neuropsych, SW  Financial Services:  Charity fundraiser Utilized: Medicare    Choices offered to/list presented to: yes  Follow-up services arranged:  Home Health, DME Home Health Agency: [facility] for HHPT/OT/SN/aide    DME : Home o2 tanks for discharge    Patient response to transportation need: Is the patient able to respond to transportation needs?: Yes In the past 12 months, has lack of transportation kept you from medical appointments or from getting medications?: No In the past 12 months, has lack of transportation kept you from meetings, work, or from getting things needed for daily living?: No  Comments (or additional information):  Patient/Family verbalized understanding of follow-up arrangements:  Yes  Individual responsible for coordination of the follow-up plan: Pt # [phone] or friend [name] [phone]  Confirmed correct DME delivered: Rana Snare 09/17/2020    Rana Snare

## 2020-09-17 NOTE — Progress Notes (Signed)
Patient ID: [name], female   DOB: 07-Nov-1945, 75 y.o.   MRN: [id number]  SW received updates from physician reporting pt will need to get meds filled here due to pain meds not able to be filled by [facility] Dept.  SW set up pt for [facility] medication assistance program.  *SW met with pt and [name] in the room to discuss medications being filled here with TOC. D/c oxygen tanks delivered to room by [employer]. SW reminded pt to d/c to home with tanks in room. [name] reports [employer] came by the home yesterday to educate on how to refill small tanks. No questions/concerns reported.   Loralee Pacas, MSW, [place] Office: [phone] Cell: [phone] Fax: [phone]

## 2020-09-17 NOTE — Progress Notes (Signed)
Occupational Therapy Session Note  Patient Details  Name: [name] MRN: [id number] Date of Birth: 04-10-1945  Today's Date: 09/17/2020 OT Individual Time: 6151-8343 OT Individual Time Calculation (min): 72 min    Short Term Goals: Week 1:  OT Short Term Goal 1 (Week 1): patient will complete bed mobility and SPT with mod A OT Short Term Goal 1 - Progress (Week 1): Met OT Short Term Goal 2 (Week 1): patient will complete eating, grooming and upper body adl with mod A using AD/built up handles OT Short Term Goal 2 - Progress (Week 1): Met OT Short Term Goal 3 (Week 1): patient will tolerate unsupported sitting with min A OT Short Term Goal 3 - Progress (Week 1): Met OT Short Term Goal 4 (Week 1): patient will complete toileting max A of one OT Short Term Goal 4 - Progress (Week 1): Met Week 2:  OT Short Term Goal 1 (Week 2): STG=LTG 2/2 ELOS (continue towards supervision/min A goals)   Skilled Therapeutic Interventions/Progress Updates:    Pt greeted at time of session semireclined in bed resting stating she did not have pain meds yet. Located nurse, med pass at beginning of session including pain meds. Bed mobility supine > sit Supervision and ambulated to bathroom same manner with RW. Pt transferring throughout session bed > ambulate to shower and back, BSC transfers all with Supervision and RW. Therapist assist for managing O2 tank throughout when pt was wearing. UB/LB bathing on BSC in shower with Min A for feet and past knee level. Pt able to reach armpits but requesting assist for thoroughness. Dried off same manner before walking back to EOB. LB dress Min A overall to thread and able to don over hips in standing. Changed pads for C collar in supine as well. Pt resting bed level alarm on call bell in reach. Note O2 sats remained 90% or higher throughout session bathing,dressing, toileting with no O2 but did drop to low 80s after all activity sitting EOB and O2 replaced via [place], sats  quickly improving.   Therapy Documentation Precautions:  Precautions Precautions: Cervical, Fall Precaution Comments: watch O2 sats Required Braces or Orthoses: Cervical Brace Cervical Brace: Hard collar, At all times Restrictions Weight Bearing Restrictions: No     Therapy/Group: Individual Therapy  Viona Gilmore 09/17/2020, 7:14 AM

## 2020-09-17 NOTE — Progress Notes (Signed)
Physical Therapy Session Note  Patient Details  Name: [name] MRN: [id number] Date of Birth: 1945-11-21  Today's Date: 09/17/2020 PT Individual Time: 1108-1205 PT Individual Time Calculation (min): 57 min   Short Term Goals: Week 2:  PT Short Term Goal 1 (Week 2): STG = LTG due to LOS  Skilled Therapeutic Interventions/Progress Updates: Pt presented in bed agreeable to therapy. Pt states pain 8/10 no intervention requested and pain meds received at end of session. Performed bed mobility with supervision and performed ambulatory transfer to w/c with supervision. Pt transported to rehab gym for energy conservation and participated in theraputic activities in preparation for d/c. Pt performed curb step with RW and supervision transferred to mat and participated in standing round of horseshoes for dynamic balance and use of rebounder in sitting for dynamic sitting balance. Pt demonstrates continued improvement in dynamic balance at supervision level. Pt requesting to use bathroom, transported to ADL apt and performed ambulatory transfer to elevated toilet with supervision and performed LB clothing management with minA due to urgency. Pt then transported to ortho gym and performed car transfer to sedan height with supervision with PTA providing x1 verbal cue for technique. Pt then transported to 4W nsg station and ambulated back to room with supervision and PTA providing verbal cues for pacing as pt began increasing speed as approached room. In room pt returned to bed and sit to supine transfer performed with supervision and without bed rail. Pt left in bed at end of session with bed alarm on, call bell within reach and friend [name] and LPN Caryl Pina present.       Therapy Documentation Precautions:  Precautions Precautions: Cervical, Fall Precaution Comments: watch O2 sats Required Braces or Orthoses: Cervical Brace Cervical Brace: Hard collar, At all times Restrictions Weight Bearing  Restrictions: No General:   Vital Signs: Therapy Vitals BP: (!) 114/55 Oxygen Therapy O2 Device: Nasal Cannula O2 Flow Rate (L/min): 3 L/min Pain: Pain Assessment Pain Scale: 0-10 Pain Score: 6  Pain Location: Neck Pain Orientation: Posterior Pain Intervention(s): Medication (See eMAR)   Therapy/Group: Individual Therapy  Marcia Hartwell Nancy Arvin, PTA  09/17/2020, 12:18 PM

## 2020-09-17 NOTE — Progress Notes (Signed)
Occupational Therapy Session Note  Patient Details  Name: [name] MRN: [id number] Date of Birth: 24-May-1945  Today's Date: 09/17/2020 OT Individual Time: 1300-1340 OT Individual Time Calculation (min): 40 min    Short Term Goals: Week 2:  OT Short Term Goal 1 (Week 2): STG=LTG 2/2 ELOS (continue towards supervision/min A goals)  Skilled Therapeutic Interventions/Progress Updates:    Pt resting in bed upon arrival with friend, [name], present. OT intervention with focus on education, primarily with changing of cervical collar pads. [name] returned demonstrated changing pads and placement of collar appropriately. Pt has good understanding of process and placement of pads also. Discussed assistance during ADLs, primarly UB dressing. Discussed overall assistance level. Pt and friend pleased with progress and ready for discharge home tomorrow.   Therapy Documentation Precautions:  Precautions Precautions: Cervical, Fall Precaution Comments: watch O2 sats Required Braces or Orthoses: Cervical Brace Cervical Brace: Hard collar Restrictions Weight Bearing Restrictions: No  Pain: Pain Assessment Pain Scale: 0-10 Pain Score: 6  Pain Location: Neck Pain Orientation: Posterior Pain Intervention(s): repositioned   Therapy/Group: Individual Therapy  Leroy Libman 09/17/2020, 1:53 PM

## 2020-09-18 ENCOUNTER — Other Ambulatory Visit (HOSPITAL_COMMUNITY): Payer: Self-pay

## 2020-09-18 MED ORDER — HYDRALAZINE HCL 25 MG PO TABS: 25.0000 mg | ORAL_TABLET | Freq: Two times a day (BID) | ORAL | Status: DC

## 2020-09-18 MED ORDER — HYDRALAZINE HCL 25 MG PO TABS
25.0000 mg | ORAL_TABLET | Freq: Two times a day (BID) | ORAL | 0 refills | Status: DC
  Filled 2020-09-18: qty 60, 30d supply, fill #0

## 2020-09-18 MED ORDER — ENOXAPARIN (LOVENOX) PATIENT EDUCATION KIT
PACK | Freq: Once | Status: DC
  Filled 2020-09-18: qty 1

## 2020-09-18 NOTE — Progress Notes (Addendum)
Patient verbalized understanding of BP medication changes. No concerns at this time. Discharged home with roommate.

## 2020-09-18 NOTE — Progress Notes (Signed)
Physical Therapy Discharge Summary  Patient Details  Name: MISHAEL KRYSIAK MRN: [id number] Date of Birth: 02-Jun-1945  Today's Date: 09/18/2020  Patient has met 9 of 9 long term goals due to improved activity tolerance, improved balance, improved postural control, increased strength, increased range of motion, and improved coordination.  Patient to discharge at an ambulatory level Supervision.   Patient's care partner is independent to provide the necessary physical assistance at discharge.  Reasons goals not met: N/A all goals met  Recommendation:  Patient will benefit from ongoing skilled PT services in home health setting to continue to advance safe functional mobility, address ongoing impairments in endurance, strength, safety, balance, independence with functional mobility, and minimize fall risk.  Equipment: Home O2  Reasons for discharge: treatment goals met  Patient/family agrees with progress made and goals achieved: Yes  PT Discharge Precautions/Restrictions Precautions Precautions: Cervical;Fall Precaution Comments: watch O2 sats Required Braces or Orthoses: Cervical Brace Cervical Brace: Hard collar Pain Pain Assessment Pain Scale: 0-10 Pain Score: 7  Pain Interference Pain Interference Pain Effect on Sleep: 2. Occasionally Pain Interference with Therapy Activities: 1. Rarely or not at all Pain Interference with Day-to-Day Activities: 1. Rarely or not at all Vision/Perception  Vision - History Ability to See in Adequate Light: 0 Adequate  Cognition Overall Cognitive Status: History of cognitive impairments - at baseline Arousal/Alertness: Awake/alert Orientation Level: Oriented X4 Sensation Sensation Light Touch: Appears Intact Hot/Cold: Appears Intact Proprioception: Appears Intact Stereognosis: Not tested Motor  Motor Motor: Tetraplegia Motor - Discharge Observations: BUE improving motor planning  Mobility Bed Mobility Bed Mobility: Supine to  Sit;Sit to Supine Supine to Sit: Supervision/Verbal cueing Sit to Supine: Supervision/Verbal cueing Transfers Transfers: Sit to Stand;Stand Pivot Transfers;Stand to Sit Sit to Stand: Supervision/Verbal cueing Stand to Sit: Supervision/Verbal cueing Stand Pivot Transfers: Supervision/Verbal cueing Transfer (Assistive device): Rolling walker Locomotion  Gait Ambulation: Yes Gait Assistance: Supervision/Verbal cueing Assistive device: Rolling walker Gait Assistance Details: Verbal cues for precautions/safety Gait Gait: Yes Gait Pattern: Right flexed knee in stance;Left flexed knee in stance;Decreased stride length;Decreased step length - right;Decreased step length - left Gait velocity: intermittent cues for pacing Stairs / Additional Locomotion Stairs: Yes Stairs Assistance: Supervision/Verbal cueing Stair Management Technique: With walker Height of Stairs: 5 Curb: Supervision/Verbal cueing Wheelchair Mobility Wheelchair Mobility: No  Trunk/Postural Assessment  Cervical Assessment Cervical Assessment: Exceptions to [facility] (forward head) Thoracic Assessment Thoracic Assessment: Exceptions to [facility] (kyphotic) Lumbar Assessment Lumbar Assessment: Exceptions to [facility] (posterior pelvic tilt) Postural Control Postural Control: Within Functional Limits  Balance Balance Balance Assessed: Yes Static Sitting Balance Static Sitting - Balance Support: Feet supported Static Sitting - Level of Assistance: 6: Modified independent (Device/Increase time) Dynamic Sitting Balance Dynamic Sitting - Balance Support: Feet supported Dynamic Sitting - Level of Assistance: 5: Stand by assistance Dynamic Sitting - Balance Activities: [place];Reaching across midline Static Standing Balance Static Standing - Balance Support: Bilateral upper extremity supported Static Standing - Level of Assistance: 5: Stand by assistance Dynamic Standing Balance Dynamic Standing - Balance Support: During functional  activity;Bilateral upper extremity supported Dynamic Standing - Level of Assistance: 5: Stand by assistance Extremity Assessment  RUE Assessment Passive Range of Motion (PROM) Comments: shoulder flex/abd 90, ER WFL, distal WFL General Strength Comments: 4/5 overall LUE Assessment Passive Range of Motion (PROM) Comments: shoulder flex/abd 120, distal WFL General Strength Comments: 4/5 overall RLE Assessment RLE Assessment: Within Functional Limits General Strength Comments: Grossly 4+/5, 4/5 hip flexors LLE Assessment LLE Assessment: Within Functional Limits General Strength  Comments: Grossly 4+/5, 4/5 hip flexors    [name] 09/18/2020, 4:59 PM

## 2020-09-18 NOTE — Progress Notes (Signed)
PROGRESS NOTE   Subjective/Complaints:   Pt reports wants meds to go to [facility] on [address]- [address] center.  Also wants the "new BP meds stopped"- but isn't clear what the new BP meds are when we went over meds.   After education on Lovenox- is willing to go home on Lovenox x 1 month daily. Ordered lovenox education kit.   ROS:   Pt denies SOB, abd pain, CP, N/V/C/D, and vision changes   Objective:   No results found. No results for input(s): WBC, HGB, HCT, PLT in the last 72 hours.  No results for input(s): NA, K, CL, CO2, GLUCOSE, BUN, CREATININE, CALCIUM in the last 72 hours.   Intake/Output Summary (Last 24 hours) at 09/18/2020 9169 Last data filed at 09/17/2020 2122 Gross per 24 hour  Intake 600 ml  Output --  Net 600 ml     Pressure Injury 09/11/20 Buttocks Left Stage 2 -  Partial thickness loss of dermis presenting as a shallow open injury with a red, pink wound bed without slough. non-blanchable area with very thin dry skin flakes covering the center (Active)  09/11/20 1441  Location: Buttocks  Location Orientation: Left  Staging: Stage 2 -  Partial thickness loss of dermis presenting as a shallow open injury with a red, pink wound bed without slough.  Wound Description (Comments): non-blanchable area with very thin dry skin flakes covering the center  Present on Admission:      Pressure Injury 09/11/20 Buttocks Right Stage 2 -  Partial thickness loss of dermis presenting as a shallow open injury with a red, pink wound bed without slough. non-blanchable area with thin layer skin flakes covering the center (Active)  09/11/20 1445  Location: Buttocks  Location Orientation: Right  Staging: Stage 2 -  Partial thickness loss of dermis presenting as a shallow open injury with a red, pink wound bed without slough.  Wound Description (Comments): non-blanchable area with thin layer skin flakes  covering the center  Present on Admission:     Physical Exam: Vital Signs Blood pressure (!) 112/55, pulse 63, temperature 98 F (36.7 C), temperature source Oral, resp. rate 18, height _0  (1.6 m), weight 75.6 kg, SpO2 91 %.      General: awake, alert, appropriate, sitting up in bed; more talkative; on O2 by [place] 2L; NAD HENT: conjugate gaze; oropharynx moist; cervical collar in place CV: regular rate; no JVD Pulmonary: CTA B/L; no W/R/R- good air movement GI: soft, NT, ND, (+)BS Psychiatric: appropriate Neurological: Ox3 Skin: cervical wound cdi with dry dressing. - . A few scattered lacs and abrasions on legs/arms, bruises remain.  Neuro:  fairly alert, oriented. Follows commands. Able to carry conversation. No CN findings. RUE 4+/5. LUE 4, decreased sensation to LT/pain LUE>LUE. LE4- prox to 4/5 distally. No sensory findings in legs. DTR's 3+ Musculoskeletal: cervicalgia ongoing in collar.  Tight trigger points in neck/shoulders/upper back - still very tight- but slightly better   Assessment/Plan: 1. Functional deficits which require 3+ hours per day of interdisciplinary therapy in a comprehensive inpatient rehab setting. Physiatrist is providing close team supervision and 24 hour management of active medical problems  listed below. Physiatrist and rehab team continue to assess barriers to discharge/monitor patient progress toward functional and medical goals  Care Tool:  Bathing    Body parts bathed by patient: Right arm, Left arm, Chest, Abdomen, Front perineal area, Right upper leg, Left upper leg, Face   Body parts bathed by helper: Buttocks, Right lower leg, Left lower leg     Bathing assist Assist Level: Moderate Assistance - Patient 50 - 74%     Upper Body Dressing/Undressing Upper body dressing   What is the patient wearing?: Pull over shirt    Upper body assist Assist Level: Minimal Assistance - Patient > 75%    Lower Body Dressing/Undressing Lower body  dressing      What is the patient wearing?: Pants, Underwear/pull up     Lower body assist Assist for lower body dressing: Minimal Assistance - Patient > 75%     Toileting Toileting    Toileting assist Assist for toileting: Supervision/Verbal cueing     Transfers Chair/bed transfer  Transfers assist     Chair/bed transfer assist level: Supervision/Verbal cueing     Locomotion Ambulation   Ambulation assist      Assist level: Supervision/Verbal cueing Assistive device: Walker-rolling Max distance: 172f   Walk 10 feet activity   Assist     Assist level: Supervision/Verbal cueing Assistive device: Walker-rolling   Walk 50 feet activity   Assist Walk 50 feet with 2 turns activity did not occur: Safety/medical concerns  Assist level: Supervision/Verbal cueing Assistive device: Walker-rolling    Walk 150 feet activity   Assist Walk 150 feet activity did not occur: Safety/medical concerns  Assist level: Supervision/Verbal cueing Assistive device: Walker-rolling    Walk 10 feet on uneven surface  activity   Assist Walk 10 feet on uneven surfaces activity did not occur: Safety/medical concerns   Assist level: Contact Guard/Touching assist Assistive device: Walker-rolling   Wheelchair     Assist Is the patient using a wheelchair?: No             Wheelchair 50 feet with 2 turns activity    Assist            Wheelchair 150 feet activity     Assist          Blood pressure (!) 112/55, pulse 63, temperature 98 F (36.7 C), temperature source Oral, resp. rate 18, height _0  (1.6 m), weight 75.6 kg, SpO2 91 %.     Medical Problem List and Plan: 1.  Gait abnormality secondary to severe spondylitic myelopathy with severe stenosis C2-3 3-4/central cord syndrome.  Status post cervical arthrodesis with posterior cervical bilateral laminectomy 08/28/2020.  Cervical collar as directed             -patient may shower              -ELOS/Goals: Supervision/Min A 17-21 days  -fitted with smaller [place] J collar which fits much better.    8/24- d/c date 9/2- con't PT and OT- CIR  Con't PT and OT- 9/2 is d/c date? Team conference today  9/1- con't PT and OT_ d/c tomorrow- shower today  9/2- d/c today- went over needs to get new meds from Dr RNelva Bush and call him to let him know she has a new SCI and needs to con't current regimen with Oxycontin and Oxycodone prn (not percocet).  I cannot write for more than 7 days due to pain contract.  2.  Antithrombotics: -DVT/anticoagulation: Subcutaneous heparin.  Check  vascular study Mechanical: Sequential compression devices, below knee Bilateral lower extremities Pharmaceutical: sq hep  8/25- change to Lovneox since Can take/Cr OK  8/27- tolerating better  9/1- will d/w pt if wants to go home on Lovenox- walking 150 ft, but is SCI pt- pt refuses to take home and use at home, regardless of research.   9/2- cannot afford Lovenox= and decided against it.              -antiplatelet therapy: N/A 3. Pain Management: Oxycodone as needed              Monitor with increased exertion  -dc'ed robaxin d/t sedation.   -continue tizanidine trial  8/23- will increase Oxycodone (get rid of percocet to Oxy 29m) to q4 hours prn to help keep pain more stable. Trigger point injections today  8/25- will add Oxycontin 10 mg BID since having so much pain- seems miserable, even with Oxycodone- cannot take Morphine- causes Hallucinations. 8/27- tolerating OxyContin well and pain down to "7/10"  8/30- will change zanaflex to q6 hours scheduled.  9/2- pt needs to let Dr RNelva Bush who she has pain contract with, that meds have changed due to SCI.  4. Mood: Zoloft 50 mg daily, Lamictal 200 mg twice daily             -antipsychotic agents: Abilify 10 mg daily   8/23- mood OK per pt- con't regimen for now 5. Neuropsych: This patient is?  Fully capable of making decisions on her own behalf. 6. Skin/Wound Care:  Routine skin checks 7. Fluids/Electrolytes/Nutrition: Routine in and outs             CMP reviewed. Add protein supp for low albumin 8.  Hypertension.  Hydralazine 25 mg twice daily, Avapro 300 mg daily, Norvasc 10 mg daily             borderline. Tizanidine helping too Vitals:   09/18/20 0346 09/18/20 0817  BP: (!) 115/58 (!) 112/55  Pulse: 60 63  Resp: 18   Temp: 98 F (36.7 C)   SpO2: 91%     8/27- BP slightly elevated, but then low this AM- somewhat labile- if continues, might update regimen- will monitor for trend.   9/1- BP doing better- con't regimen  9/2- pt said she refused 1 of BP meds last night, maybe 2- wants to "stop" new BP meds.   - saw that was on hydralazine BID at home- reduced dose today. Might need other med changes? 9.  COPD/history of tobacco use.  Continue inhalers as directed.  Monitor oxygen saturations  -O2 [place] as needed  8/24- meets criteria per walk test to continue home O2  8/27- on 2L O2 at home- will continue 10.  GERD.  Protonix 11.  CAD with history of stenting.  Plan to discuss with neurosurgery when to resume low-dose aspirin 12.  Diastolic congestive heart failure.  Lasix 20 mg daily.  Monitor for any signs of fluid overload   Filed Weights   09/16/20 0500 09/17/20 0500 09/18/20 0448  Weight: 78.4 kg 77.1 kg 75.6 kg    -8/24- weight up x1 day, then back down- con't to monitor  8/29- weight stable- continue to monitor   -monitor daily 13. Neurogenic bowel: still no bm yesterday despite sorbitol and sse    8/19 mg citrate on backorder -repeat sorbitol and SSE later today if needed -added linzess 8/28- Sorbitol last night- 3 BM's- will wait on more sorbitol for now. 8/30- feels  like needs to go- told her to not hold Linzess  14. Neurogenic bladder:  -continue voiding trial  -I/O cath prn to keep volumes between 300-500cc, patient is not emptying completely  8/24- will add Flomax to help pt void better/more completely.   8/25- now voiding more-  will con't flomax  8/29- emptying with voiding- continue flomax  9/1- send home tomorrow on Flomax 0.8 mg qsupper 15. Dispo  9/2-d/c today- to see Dr Ranell Patrick after d/c and then f/u with me.    I spent a total of 32 minutes on total care today- >50% on coordination of care- speaking with pt and PA about meds/care.    LOS: 16 days A FACE TO FACE EVALUATION WAS PERFORMED  Geovani Tootle 09/18/2020, 8:22 AM

## 2020-09-22 ENCOUNTER — Telehealth: Payer: Self-pay | Admitting: Physical Medicine and Rehabilitation

## 2020-09-22 NOTE — Telephone Encounter (Signed)
PT [name] called number is [phone] (can leave voicemail) req verbal for PT 1xweek for 2 week, 2 X week for 3 week, 1 Week x 5 weeks for strength balance endurance and functional mobility also would like HH Aide order 1X week for 1 week and @ 2x week for 2 week to aide in bathing Medication report - patient is not taking the following: Calcium supplement Lenzess Tamsulosin (phone is garbled when she names this RX) ???

## 2020-09-23 ENCOUNTER — Telehealth: Payer: Self-pay | Admitting: Registered Nurse

## 2020-09-23 ENCOUNTER — Telehealth: Payer: Self-pay

## 2020-09-23 NOTE — Telephone Encounter (Signed)
I didn't think she would take Linzess- - thanks- can renew therapy as directed above- ML

## 2020-09-23 NOTE — Telephone Encounter (Signed)
Transitional Care call  Patient name: [name]  DOB: Nov 18, 1945 Are you/is patient experiencing any problems since coming home? No Are there any questions regarding any aspect of care? No Are there any questions regarding medications administration/dosing? No Are meds being taken as prescribed? Yes "Patient should review meds with caller to confirm" Medication List Reviewed.  Have there been any falls? No Has Home Health been to the house and/or have they contacted you? Yes: [facility] If not, have you tried to contact them? NA Can we help you contact them? NA Are bowels and bladder emptying properly? Yes Are there any unexpected incontinence issues? No If applicable, is patient following bowel/bladder programs? NA Any fevers, problems with breathing, unexpected pain? No Are there any skin problems or new areas of breakdown? No Has the patient/family member arranged specialty MD follow up (ie cardiology/neurology/renal/surgical/etc.)?  She was instructed to call her PCP to schedule HFU appointment. She has a scheduled appointment with Dr Dawley Can we help arrange? NA Does the patient need any other services or support that we can help arrange? No Are caregivers following through as expected in assisting the patient? Yes Has the patient quit smoking, drinking alcohol, or using drugs as recommended? (                        )  Appointment date/time 10/14/2020  arrival time 9:20 for 9:40 appointment with Dr Dagoberto Ligas. At [facility]

## 2020-09-23 NOTE — Telephone Encounter (Signed)
Chart reviewed: Verbal in home skilled nursing orders okay given. Services to be once a week for four weeks.  Sharyn Lull with [facility] ph# [phone]).

## 2020-09-23 NOTE — Telephone Encounter (Signed)
Notified [facility].

## 2020-10-08 ENCOUNTER — Telehealth: Payer: Self-pay | Admitting: *Deleted

## 2020-10-08 NOTE — Telephone Encounter (Signed)
Request  [facility]HA extension 2wk1. Approval given.

## 2020-10-09 DIAGNOSIS — R300 Dysuria: Secondary | ICD-10-CM | POA: Diagnosis not present

## 2020-10-13 ENCOUNTER — Other Ambulatory Visit: Payer: Self-pay | Admitting: *Deleted

## 2020-10-14 ENCOUNTER — Encounter: Payer: Self-pay | Admitting: Physical Medicine and Rehabilitation

## 2020-10-14 ENCOUNTER — Encounter
Payer: Medicare Other | Attending: Physical Medicine and Rehabilitation | Admitting: Physical Medicine and Rehabilitation

## 2020-10-14 ENCOUNTER — Other Ambulatory Visit: Payer: Self-pay

## 2020-10-14 VITALS — BP 148/79 | HR 81 | Temp 98.9°F | Ht 63.0 in | Wt 170.0 lb

## 2020-10-14 DIAGNOSIS — S14129D Central cord syndrome at unspecified level of cervical spinal cord, subsequent encounter: Secondary | ICD-10-CM | POA: Insufficient documentation

## 2020-10-14 DIAGNOSIS — M4802 Spinal stenosis, cervical region: Secondary | ICD-10-CM | POA: Insufficient documentation

## 2020-10-14 DIAGNOSIS — G8921 Chronic pain due to trauma: Secondary | ICD-10-CM | POA: Insufficient documentation

## 2020-10-14 NOTE — Patient Instructions (Signed)
Pt is a 75 yr old female with hx of severe spondylitis myelopathy with severe stenosis S/P C2-4 cervical arthrodesis 08/28/20- with central cord syndrome.    Pt is here for hospital f/u.    If gets a UTI, will need to go back on Flomax- would be sign that she's retaining urine.    2. Speak with Dr Nelva Bush and see if he's comfortable prescribing Oxycontin 10 mg BID AND Oxycodone 10 mg q4 hours prn- pain isn't well controlled   3. Will get back for trigger point injections asp   4. If Dr Nelva Bush will not do pain meds, I can take over if I do opaite contract, UDS, etc.    5. F/U asap and 3 months

## 2020-10-14 NOTE — Progress Notes (Signed)
Subjective:    Patient ID: [name], female    DOB: 1945-08-31, 75 y.o.   MRN: [id number]  HPI Pt is a 75 yr old female with hx of severe spondylitis myelopathy with severe stenosis S/P C2-4 cervical arthrodesis 08/28/20- with central cord syndrome.   Pt is here for hospital f/u.   Pain is 10/10- cannot turn head either way.  Overdid turning head and now cannot turn head at all.   Out of Oxycontin- after 7 days- Got the Oxycodone 10 mg from Dr Nelva Bush-  Only taking 1 tab q4 hours- 4-5 tabs/day.  Not taking Oxycontin.   Pain went up more when stopped oycontin.  Just don't like taking meds at all.  Doesn't want to talk to Dr Nelva Bush about long acting pain meds.  Is scared/tearful that pain is so uncontrolled.   Still on Zanaflex- not helping as much as it was- less helpful overall.   Bowels OK- LBM yesterday.  Still peeing- stopped Flomax- stopped it completely- immediately after left the hospital.  Doesn't need to pee again 30-60 minutes later- feels like empties.    Doing H/H PT and OT still-  Gotten out of house 3x since d/c- nails, hair and this appointment- using RW to get around.  Walking around the house during the day.        Pain Inventory Average Pain 8 Pain Right Now 8 My pain is constant and aching  LOCATION OF PAIN  neck  BOWEL Number of stools per week: 3 Oral laxative use No  Type of laxative na Enema or suppository use No  History of colostomy No  Incontinent No   BLADDER Normal In and out cath, frequency na Able to self cath  na Bladder incontinence No  Frequent urination No  Leakage with coughing No  Difficulty starting stream No  Incomplete bladder emptying No    Mobility use a walker do you drive?  no  Function retired I need assistance with the following:  dressing, bathing, and meal prep  Neuro/Psych tingling  Prior Studies Hospital f/u  Physicians involved in your care Hospital f/u   Family History  Problem  Relation Age of Onset   Hypertension Mother    Hypertension Father    Cancer Maternal Grandfather        type unknown   Hypertension Sister    Social History   Socioeconomic History   Marital status: Divorced    Spouse name: Not on file   Number of children: 0   Years of education: Not on file   Highest education level: Not on file  Occupational History   Occupation: retired  Tobacco Use   Smoking status: Former    Packs/day: 2.00    Years: 50.00    Pack years: 100.00    Types: Cigarettes    Quit date: 01/17/2010    Years since quitting: 10.7   Smokeless tobacco: Never   Tobacco comments:    STATES QUIT SMOKING 01-17-2010  Vaping Use   Vaping Use: Never used  Substance and Sexual Activity   Alcohol use: Not Currently    Comment: RECOVERING ALCOHOLIC--   QUIT IN 4098   Drug use: No   Sexual activity: Never  Other Topics Concern   Not on file  Social History Narrative   Not on file   Social Determinants of Health   Financial Resource Strain: Not on file  Food Insecurity: Not on file  Transportation Needs: Not on file  Physical  Activity: Not on file  Stress: Not on file  Social Connections: Not on file   Past Surgical History:  Procedure Laterality Date   BREAST EXCISIONAL BIOPSY Left 2019   b9 axilla Bx X 3   CAROTID ENDARTERECTOMY  1995   RIGHT   CATARACT EXTRACTION W/ INTRAOCULAR LENS  IMPLANT, BILATERAL     CERVICAL CONIZATION W/BX  09-23-2008   CORONARY ANGIOPLASTY WITH STENT PLACEMENT  2000-   INFERIOR MI   X1 STENT TO RCA   EUS N/A 03/05/2012   Procedure: FULL UPPER ENDOSCOPIC ULTRASOUND (EUS) RADIAL and EGD;  Surgeon: Milus Banister, MD;  Location: [facility];  Service: Endoscopy;  Laterality: N/A;  ercp scope first than eus scope   HEMIARTHROPLASTY HIP  12-26-2008   LEFT FEMORAL NECK FX   ORIF HIP FRACTURE  02-13-2007   RIGHT FEMORAL NECK FX   ORIF RIGHT DISTAL RADIUS AND RIGHT PROXIMAL HUMEROUS NECK FX'S  10-10-2005   POSTERIOR CERVICAL  LAMINECTOMY N/A 08/28/2020   Procedure: Laminectomy and Foraminotomy - Cervical Two-Three, Cervical Three-Four, with lateral mass fusion/ fixation;  Surgeon: Dawley, Theodoro Doing, DO;  Location: [facility];  Service: Neurosurgery;  Laterality: N/A;   RIGHT SHOULDER SURG.  2007   ROBOTIC ASSITED PARTIAL NEPHRECTOMY Left 07/09/2012   Procedure: ROBOTIC ASSITED PARTIAL NEPHRECTOMY;  Surgeon: Dutch Gray, MD;  Location: [facility];  Service: Urology;  Laterality: Left;   TOTAL HIP ARTHROPLASTY  04-15-2008   POST FAILED  RIGHT HIP ORIF FEMORAL FX   TOTAL KNEE ARTHROPLASTY  09-22-2009   RIGHT   UPPER RIGHT VAGINAL REGION  12/28/11   BIOPSY: SQUAMOUS CELL CARCINOMA   VAGINAL HYSTERECTOMY  07/06/2009   Secondary to dysplasia   Past Medical History:  Diagnosis Date   Alcoholism ([place])    recovering since 2000   Anxiety and depression    Arthritis BACK   Bipolar disorder ([place])    Blepharospasm LEFT EYE   CHF exacerbation ([place]) 02/24/2017   Chronic back pain    COPD (chronic obstructive pulmonary disease) (HCC)    Coronary artery disease CARDIOLOGIST- DR [place]--- LAST VISIT NOTE 09-07-2009  W/ CHART   Emphysema    GERD (gastroesophageal reflux disease)    History of alcohol abuse RECOVERING SINCE 2000   History of Left renal mass 03/02/2012   underwent partial nephrectomy   HTN (hypertension)    Idiopathic acute facial nerve palsy LEFT SIDE--  BOTOX THERAPY   Inferior MI ([place]) 2000--  POST PTCA W/ STENT X1   Osteoporosis    Other and unspecified general anesthetics causing adverse effect in therapeutic use post op delirium--  last anes record w/ chart  from   09-22-2009 (spinal w/ light sedation)   Peripheral vascular disease ([place]) POST RIGHT CAROTID SURG.  1995   Renal cell carcinoma ([place]) 07/09/12   Left mass   Rosacea LEFT FACIAL RASH   S/P radiation therapy 02/21/2012   38.75 Gy HDR 5 Fractions- vaginal cuff   Scoliosis    Seizures (HCC)    x 1 after abrupt discontinuation of  Clonidine   Status post  carotid endarterectomy RIGHT --  1995   Status post primary angioplasty with coronary stent 2000--  POST INFERIOR MI   Unstable balance WALKS W/ CANE   Vaginal cancer (HCC)    BP (!) 148/79   Pulse 81   Temp 98.9 F (37.2 C) (Oral)   Ht 5\' 3"  (1.6 m)   Wt 170 lb (77.1 kg)   SpO2 91%  BMI 30.11 kg/m   Opioid Risk Score:   Fall Risk Score:  `1  Depression screen PHQ 2/9  Depression screen [facility] 2/9 10/14/2020 12/02/2016 12/04/2015 01/02/2015 09/20/2013  Decreased Interest 0 0 0 1 0  Down, Depressed, Hopeless 0 0 0 1 0  PHQ - 2 Score 0 0 0 2 0  Altered sleeping 0 - - 0 -  Tired, decreased energy 0 - - 1 -  Change in appetite 0 - - 0 -  Feeling bad or failure about yourself  0 - - 1 -  Trouble concentrating 0 - - 0 -  Moving slowly or fidgety/restless 0 - - 0 -  Suicidal thoughts 0 - - 0 -  PHQ-9 Score 0 - - 4 -  Some recent data might be hidden     Review of Systems  Musculoskeletal:  Positive for neck pain.  Neurological:        Tingling  All other systems reviewed and are negative.     Objective:   Physical Exam  Awake, alert, appropriate, tearful at times; accompanied by housemate, NAD, on chronic home O2 UE- 4+/5 in [facility] except L FA 4-/5 LE- HF 4/5, KE 4+/5, DF/PF 4/5 B/L Core strength much weaker- almost fell over testing UB Tight trigger points in scalenes B/L  Neuro_ no hoffman's B/L      Assessment & Plan:   Pt is a 75 yr old female with hx of severe spondylitis myelopathy with severe stenosis S/P C2-4 cervical arthrodesis 08/28/20- with central cord syndrome.   Pt is here for hospital f/u.   If gets a UTI, will need to go back on Flomax- would be sign that she's retaining urine.   2. Speak with Dr Nelva Bush and see if he's comfortable prescribing Oxycontin 10 mg BID AND Oxycodone 10 mg q4 hours prn- pain isn't well controlled  3. Will get back for trigger point injections asp  4. If Dr Nelva Bush will not do pain meds, I can take over if I do opaite contract,  UDS, etc.   5. F/U asap and 3 months  I spent a total of 35 minutes on visit- total discussing pain control and how to handle since in opiate contract somewhere else.

## 2020-10-15 ENCOUNTER — Telehealth: Payer: Self-pay | Admitting: Physical Medicine and Rehabilitation

## 2020-10-15 NOTE — Telephone Encounter (Signed)
Patient is calling to let us know that patient can be seen by her orthopedic doctor Dr. Jeralyn Ruths office Arbie Cookey his PA) to get the trigger point injections.  Their office is asking [place] to send something to their office to say it is okay for them to do it.  Please fax it to [phone] to the attn of Arbie Cookey.  Any questions please call patient.

## 2020-10-16 ENCOUNTER — Telehealth: Payer: Self-pay | Admitting: Physical Medicine and Rehabilitation

## 2020-10-16 NOTE — Telephone Encounter (Signed)
Back to [facility]- maybe just let them knowtrigger points are ok- ml

## 2020-10-16 NOTE — Telephone Encounter (Signed)
Clarise Cruz RN with [facility] needs to report that patient is having increased pain.  Last week her pain was around a 6-8 and this week it is at a 10.  Please call Clarise Cruz @ [phone].

## 2020-10-16 NOTE — Telephone Encounter (Signed)
Message faxed to Dr Nelva Bush office.

## 2020-10-19 ENCOUNTER — Telehealth: Payer: Self-pay

## 2020-10-19 NOTE — Telephone Encounter (Signed)
Okay given for in-home skilled nursing orders once a week for 4 weeks. Okay given after discharged orders were reviewed. Per protocol.

## 2020-10-23 NOTE — Progress Notes (Signed)
Cardiology Office Note   Date:  10/26/2020   ID:  JENNYLEE Martinez, DOB 02-03-1945, MRN [id number]  PCP:  Harlan Stains, MD  Cardiologist:  Desarai Barrack [place], MD EP: None  Chief Complaint  Patient presents with   Coronary Artery Disease   Congestive Heart Failure       History of Present Illness: [name] is a 75 y.o. female with PMH of CAD s/p inferior MI with PCI to RCA in 2000, chronic combined CHF (EF 45-50% 02/2017), carotid artery stenosis s/p R CEA in 1995, PAD with moderate bilateral fem-pop disease medically, HTN, HLD, COPD, and renal CA s/p partial L nephrectomy in 2014.   Her last ischemic evaluation was a NST 02/2017 which showed EF 51%, fixed defect in the inferolateral wall which was felt to be reflective of prior inferior MI. Prior  echocardiogram 02/2017 showed EF 45-50%, hypokinesis and scarring of basal inferolateral and inferior myocardium c/w infarction in the RCA, and G1DD.    Echo was repeated 03/20/19 and showed normal LV function. EF 55-60%. No WMA.  LE dopplers in June showed stable moderate disease.   She was admitted in May 2022 for COPD exacerbation, and pulmonary edema.  EF normal. She was treated with IV Lasix, Solu-Medrol, and breathing treatments.  It was felt that she had also had decompensated CHF.  She was discharged on as needed oxygen at home.    In August she was admitted with central cord syndrome and underwent Posterior cervical arthrodesis, C2-3, C3-4, with lateral mass instrumentation; K2M [place] instrumentation,Posterior cervical bilateral laminectomy C2, C3, C4. She was on inpatient Rehab from 8/17-09/18/20.   On follow up today she is doing well from a cardiac standpoint. She denies any increased dyspnea or chest pain. No edema or palpitations. She is getting home PT and OT. Still has limitation turning her neck.    Past Medical History:  Diagnosis Date   Alcoholism ([place])    recovering since 2000   Anxiety and depression    Arthritis  BACK   Bipolar disorder ([place])    Blepharospasm LEFT EYE   CHF exacerbation ([place]) 02/24/2017   Chronic back pain    COPD (chronic obstructive pulmonary disease) (H[facility])    Coronary artery disease CARDIOLOGIST- DR [place]--- LAST VISIT NOTE 09-07-2009  W/ CHART   Emphysema    GERD (gastroesophageal reflux disease)    History of alcohol abuse RECOVERING SINCE 2000   History of Left renal mass 03/02/2012   underwent partial nephrectomy   HTN (hypertension)    Idiopathic acute facial nerve palsy LEFT SIDE--  BOTOX THERAPY   Inferior MI ([place]) 2000--  POST PTCA W/ STENT X1   Osteoporosis    Other and unspecified general anesthetics causing adverse effect in therapeutic use post op delirium--  last anes record w/ chart  from   09-22-2009 (spinal w/ light sedation)   Peripheral vascular disease ([place]) POST RIGHT CAROTID SURG.  1995   Renal cell carcinoma ([place]) 07/09/12   Left mass   Rosacea LEFT FACIAL RASH   S/P radiation therapy 02/21/2012   38.75 Gy HDR 5 Fractions- vaginal cuff   Scoliosis    Seizures (H[facility])    x 1 after abrupt discontinuation of  Clonidine   Status post carotid endarterectomy RIGHT --  1995   Status post primary angioplasty with coronary stent 2000--  POST INFERIOR MI   Unstable balance WALKS W/ CANE   Vaginal cancer [facility])     Past Surgical History:  Procedure Laterality Date   BREAST EXCISIONAL BIOPSY Left 2019   b9 axilla Bx X 3   CAROTID ENDARTERECTOMY  1995   RIGHT   CATARACT EXTRACTION W/ INTRAOCULAR LENS  IMPLANT, BILATERAL     CERVICAL CONIZATION W/BX  09-23-2008   CORONARY ANGIOPLASTY WITH STENT PLACEMENT  2000-   INFERIOR MI   X1 STENT TO RCA   EUS N/A 03/05/2012   Procedure: FULL UPPER ENDOSCOPIC ULTRASOUND (EUS) RADIAL and EGD;  Surgeon: Milus Banister, MD;  Location: [facility];  Service: Endoscopy;  Laterality: N/A;  ercp scope first than eus scope   HEMIARTHROPLASTY HIP  12-26-2008   LEFT FEMORAL NECK FX   ORIF HIP FRACTURE  02-13-2007   RIGHT FEMORAL  NECK FX   ORIF RIGHT DISTAL RADIUS AND RIGHT PROXIMAL HUMEROUS NECK FX'S  10-10-2005   POSTERIOR CERVICAL LAMINECTOMY N/A 08/28/2020   Procedure: Laminectomy and Foraminotomy - Cervical Two-Three, Cervical Three-Four, with lateral mass fusion/ fixation;  Surgeon: Dawley, Theodoro Doing, DO;  Location: [facility];  Service: Neurosurgery;  Laterality: N/A;   RIGHT SHOULDER SURG.  2007   ROBOTIC ASSITED PARTIAL NEPHRECTOMY Left 07/09/2012   Procedure: ROBOTIC ASSITED PARTIAL NEPHRECTOMY;  Surgeon: Dutch Gray, MD;  Location: [facility];  Service: Urology;  Laterality: Left;   TOTAL HIP ARTHROPLASTY  04-15-2008   POST FAILED  RIGHT HIP ORIF FEMORAL FX   TOTAL KNEE ARTHROPLASTY  09-22-2009   RIGHT   UPPER RIGHT VAGINAL REGION  12/28/11   BIOPSY: SQUAMOUS CELL CARCINOMA   VAGINAL HYSTERECTOMY  07/06/2009   Secondary to dysplasia     Current Outpatient Medications  Medication Sig Dispense Refill   acetaminophen (TYLENOL) 325 MG tablet Take 2 tablets (650 mg total) by mouth every 4 (four) hours as needed for mild pain ((score 1 to 3) or temp > 100.5).     albuterol (VENTOLIN HFA) 108 (90 Base) MCG/ACT inhaler Inhale 2 puffs into the lungs every 6 (six) hours as needed for wheezing. 18 g 1   ARIPiprazole (ABILIFY) 10 MG tablet Take 1 tablet (10 mg total) by mouth daily. 30 tablet 0   atorvastatin (LIPITOR) 20 MG tablet Take 1 tablet (20 mg total) by mouth daily. 30 tablet 0   Budeson-Glycopyrrol-Formoterol (BREZTRI AEROSPHERE) 160-9-4.8 MCG/ACT AERO Inhale 2 puffs into the lungs in the morning and at bedtime. 10.7 g 5   Calcium Carb-Cholecalciferol 600-400 MG-UNIT TABS Take 1 tablet by mouth daily. 60 tablet 0   Cholecalciferol (VITAMIN D3) 50 MCG (2000 UT) TABS Take 2,000 Units by mouth daily. 30 tablet 0   denosumab (PROLIA) 60 MG/ML SOSY injection Inject 60 mg into the skin every 6 (six) months.     dexlansoprazole (DEXILANT) 60 MG capsule Take 1 capsule (60 mg total) by mouth daily. 30 capsule 0   docusate  sodium (COLACE) 100 MG capsule Take 100 mg by mouth daily as needed for mild constipation.     ferrous sulfate 325 (65 FE) MG tablet Take 1 tablet (325 mg total) by mouth daily. 30 tablet 3   furosemide (LASIX) 20 MG tablet Take 1 tablet (20 mg total) by mouth daily. 30 tablet 3   lamoTRIgine (LAMICTAL) 100 MG tablet Take 2 tablets (200 mg total) by mouth 2 (two) times daily. 120 tablet 0   linaclotide (LINZESS) 145 MCG CAPS capsule Take 1 capsule (145 mcg total) by mouth daily before breakfast. 30 capsule 0   montelukast (SINGULAIR) 10 MG tablet TAKE 1 TABLET BY MOUTH EVERYDAY AT BEDTIME (  Patient taking differently: Take 10 mg by mouth at bedtime.) 90 tablet 1   Multiple Vitamin (MULTIVITAMIN) capsule Take 1 capsule by mouth daily.      nitroGLYCERIN (NITROSTAT) 0.4 MG SL tablet Place 1 tablet (0.4 mg total) under the tongue every 5 (five) minutes as needed for chest pain. 25 tablet 3   Omega-3 Fatty Acids (FISH OIL) 1000 MG CAPS Take 1,000 mg by mouth 2 (two) times daily.     oxyCODONE (OXYCONTIN) 10 mg 12 hr tablet Take 1 tablet (10 mg total) by mouth every 12 (twelve) hours. 14 tablet 0   Oxycodone HCl 10 MG TABS Take 1 tablet (10 mg total) by mouth every 4 (four) hours as needed for severe pain. 30 tablet 0   Polyethyl Glycol-Propyl Glycol (SYSTANE) 0.4-0.3 % GEL ophthalmic gel Place 1 application into both eyes daily as needed (dry eyes).     sertraline (ZOLOFT) 50 MG tablet Take 1 tablet (50 mg total) by mouth daily. 30 tablet 0   tamsulosin (FLOMAX) 0.4 MG CAPS capsule Take 1 capsule (0.4 mg total) by mouth daily after supper. 30 capsule 0   tiZANidine (ZANAFLEX) 4 MG tablet Take 1 tablet (4 mg total) by mouth every 6 (six) hours. 120 tablet 0   valsartan (DIOVAN) 160 MG tablet Take 2 tablets (320 mg total) by mouth daily. 90 tablet 2   amLODipine (NORVASC) 5 MG tablet Take 1 tablet (5 mg total) by mouth daily. (Patient not taking: Reported on 10/26/2020) 30 tablet 0   aspirin EC 81 MG  tablet Take 81 mg by mouth every morning.  (Patient not taking: Reported on 10/26/2020)     hydrALAZINE (APRESOLINE) 25 MG tablet Take 1 tablet (25 mg total) by mouth 2 (two) times daily. 60 tablet 6   potassium chloride (KLOR-CON) 8 MEQ tablet Take 1 tablet (8 mEq total) by mouth daily. 30 tablet 6   No current facility-administered medications for this visit.    Allergies:   Doxycycline, Gabapentin, and Morphine and related    Social History:  The patient  reports that she quit smoking about 10 years ago. Her smoking use included cigarettes. She has a 100.00 pack-year smoking history. She has never used smokeless tobacco. She reports that she does not currently use alcohol. She reports that she does not use drugs.   Family History:  The patient's family history includes Cancer in her maternal grandfather; Hypertension in her father, mother, and sister.    ROS:  Please see the history of present illness.   Otherwise, review of systems are positive for none.   All other systems are reviewed and negative.    PHYSICAL EXAM: VS:  BP 125/71   Pulse 76   Ht 5\' 3"  (1.6 m)   Wt 169 lb 12.8 oz (77 kg)   SpO2 (!) 88%   BMI 30.08 kg/m  , BMI Body mass index is 30.08 kg/m. GEN: Well nourished, well developed, in no acute distress HEENT: sclera anicteric  Neck: no JVD, carotid bruits, or masses Cardiac: RRR; no murmurs, rubs, or gallops, no edema  Respiratory:  clear GI: soft, nontender, nondistended, + BS MS: no deformity or atrophy Skin: warm and dry, no rash Neuro:  Strength and sensation are intact Psych: euthymic mood, full affect   EKG:  EKG is  Not ordered today.   Recent Labs: 05/28/2020: B Natriuretic Peptide 222.4 05/31/2020: Magnesium 2.2 09/03/2020: ALT 16; BUN 7; Creatinine, Ser 0.68; Hemoglobin 13.1; Platelets 270; Potassium 3.7; Sodium  134   Dated 02/22/19: A1c 5.8%. cholesterol 176, triglycerides 52, HDL 93, LDL 62. CMET, CBC, TSH normal Dated 08/22/19: A1c 5.9%.  Dated  11/06/19: CMET normal. Dated 11/25/19: hgb 11.5.  Dated 02/25/20: HDL 90, LDL 51,triglycerides 47. Cholesterol 151. A1c 5.9%. TSH normal.   Lipid Panel    Component Value Date/Time   CHOL  12/25/2008 0550    161        ATP III CLASSIFICATION:  <200     mg/dL   Desirable  200-239  mg/dL   Borderline High  >=240    mg/dL   High          TRIG 39 12/25/2008 0550   HDL 102 12/25/2008 0550   CHOLHDL 1.6 12/25/2008 0550   VLDL 8 12/25/2008 0550   LDLCALC  12/25/2008 0550    51        Total Cholesterol/HDL:CHD Risk Coronary Heart Disease Risk Table                     Men   Women  1/2 Average Risk   3.4   3.3  Average Risk       5.0   4.4  2 X Average Risk   9.6   7.1  3 X Average Risk  23.4   11.0        Use the calculated Patient Ratio above and the CHD Risk Table to determine the patient's CHD Risk.        ATP III CLASSIFICATION (LDL):  <100     mg/dL   Optimal  100-129  mg/dL   Near or Above                    Optimal  130-159  mg/dL   Borderline  160-189  mg/dL   High  >190     mg/dL   Very High      Wt Readings from Last 3 Encounters:  10/26/20 169 lb 12.8 oz (77 kg)  10/14/20 170 lb (77.1 kg)  09/18/20 166 lb 10.7 oz (75.6 kg)      Other studies Reviewed: Additional studies/ records that were reviewed today include:   NST 02/2017: IMPRESSION: 1. Fixed defect in the inferolateral wall, likely old infarct. There appears to be some mild peri-infarct ischemia in the adjacent lateral wall.   2. Decreased wall motion and thickening in the inferolateral wall.   3. Left ventricular ejection fraction 51%   4. Non invasive risk stratification*: High     Echocardiogram 02/2017: - Left ventricle: The cavity size was normal. There was mild    concentric hypertrophy. Systolic function was mildly reduced. The    estimated ejection fraction was in the range of 45% to 50%.    Hypokinesis and scarring of the basal-midinferolateral and    inferior myocardium; consistent  with infarction in the    distribution of the right coronary or left circumflex coronary    artery. Doppler parameters are consistent with abnormal left    ventricular relaxation (grade 1 diastolic dysfunction).  - Mitral valve: Calcified annulus.   Echo 03/20/19: IMPRESSIONS     1. Left ventricular ejection fraction, by estimation, is 60 to 65%. The  left ventricle has normal function. The left ventricle has no regional  wall motion abnormalities. Left ventricular diastolic parameters are  consistent with Grade I diastolic  dysfunction (impaired relaxation). The average left ventricular global  longitudinal strain is -19.4 %.   2. Right  ventricular systolic function is normal. The right ventricular  size is normal. There is mildly elevated pulmonary artery systolic  pressure.   3. Left atrial size was severely dilated.   4. The mitral valve is normal in structure and function. Mild mitral  valve regurgitation. No evidence of mitral stenosis.   5. The aortic valve is normal in structure and function. Aortic valve  regurgitation is not visualized. No aortic stenosis is present.   6. The inferior vena cava is normal in size with greater than 50%  respiratory variability, suggesting right atrial pressure of 3 mmHg.   LE arterial  dopplers 05/20/19: Summary:  Right: Resting right ankle-brachial index indicates moderate right lower  extremity arterial disease. The right toe-brachial index is abnormal.   Left: Resting left ankle-brachial index indicates moderate left lower  extremity arterial disease. The left toe-brachial index is abnormal.    Echo 05/29/20: IMPRESSIONS     1. Left ventricular ejection fraction, by estimation, is 55%. The left  ventricle has normal function. The left ventricle demonstrates regional  wall motion abnormalities with basal inferior severe hypokinesis. There is  mild left ventricular hypertrophy.  Left ventricular diastolic parameters are consistent with  Grade I  diastolic dysfunction (impaired relaxation).   2. Right ventricular systolic function is normal. The right ventricular  size is normal. There is normal pulmonary artery systolic pressure. The  estimated right ventricular systolic pressure is 83.6 mmHg.   3. Left atrial size was mildly dilated.   4. Right atrial size was mildly dilated.   5. The mitral valve is degenerative. Mild mitral valve regurgitation. No  evidence of mitral stenosis. Moderate mitral annular calcification.   6. The aortic valve is tricuspid. Aortic valve regurgitation is not  visualized. Mild aortic valve sclerosis is present, with no evidence of  aortic valve stenosis.   7. The inferior vena cava is normal in size with greater than 50%  respiratory variability, suggesting right atrial pressure of 3 mmHg.   ASSESSMENT AND PLAN:  1. Chronic diastolic CHF:  Not on BBlocker due to baseline bradycardia.  - Continue lasix 20mg  daily/ on potassium - appears euvolemic today.   2. HTN: BP well controlled. - will continue current regimen.   3. CAD s/p MI with PCI to RCA in 2000:  - she is asymptomatic.  - Continue aspirin and statin   4. Carotid artery stenosis: s/p R CEA 1995.  -  repeat dopplers in June showed patent RCEA site. Increased velocity in left common carotid compared to prior. - Continue aspirin and statin - has follow up with [facility]   5. PAD: moderate bilateral fem-pop disease noted on last ABI 02/2018.  - no change by recent dopplers in June.  - Continue aspirin and statin   6. HLD: - Continue caduet (atorvastatin 20mg  daily/amlodipine combination). Lipids are at goal.  7. Cervical spine disease with central cord syndrome. S/p surgery    Current medicines are reviewed at length with the patient today.  The patient does not have concerns regarding medicines.  The following changes have been made:  As above  Labs/ tests ordered today include:   No orders of the defined types were placed  in this encounter.    Disposition:   FU 6 months  Signed, Delle Andrzejewski [place], MD  10/26/2020 11:29 AM

## 2020-10-26 ENCOUNTER — Encounter: Payer: Self-pay | Admitting: Cardiology

## 2020-10-26 ENCOUNTER — Ambulatory Visit (INDEPENDENT_AMBULATORY_CARE_PROVIDER_SITE_OTHER): Payer: Medicare Other | Admitting: Cardiology

## 2020-10-26 ENCOUNTER — Other Ambulatory Visit: Payer: Self-pay

## 2020-10-26 VITALS — BP 125/71 | HR 76 | Ht 63.0 in | Wt 169.8 lb

## 2020-10-26 DIAGNOSIS — I739 Peripheral vascular disease, unspecified: Secondary | ICD-10-CM | POA: Diagnosis not present

## 2020-10-26 DIAGNOSIS — I2510 Atherosclerotic heart disease of native coronary artery without angina pectoris: Secondary | ICD-10-CM | POA: Diagnosis not present

## 2020-10-26 DIAGNOSIS — I6523 Occlusion and stenosis of bilateral carotid arteries: Secondary | ICD-10-CM

## 2020-10-26 DIAGNOSIS — E785 Hyperlipidemia, unspecified: Secondary | ICD-10-CM | POA: Diagnosis not present

## 2020-10-26 DIAGNOSIS — M5136 Other intervertebral disc degeneration, lumbar region: Secondary | ICD-10-CM | POA: Diagnosis not present

## 2020-10-26 DIAGNOSIS — I5032 Chronic diastolic (congestive) heart failure: Secondary | ICD-10-CM

## 2020-10-26 DIAGNOSIS — M791 Myalgia, unspecified site: Secondary | ICD-10-CM | POA: Diagnosis not present

## 2020-10-26 DIAGNOSIS — I10 Essential (primary) hypertension: Secondary | ICD-10-CM

## 2020-10-26 MED ORDER — POTASSIUM CHLORIDE ER 8 MEQ PO TBCR
8.0000 meq | EXTENDED_RELEASE_TABLET | Freq: Every day | ORAL | 6 refills | Status: DC
Start: 2020-10-26 — End: 2021-06-28

## 2020-10-26 MED ORDER — HYDRALAZINE HCL 25 MG PO TABS: 25.0000 mg | ORAL_TABLET | Freq: Two times a day (BID) | ORAL | 6 refills | Status: DC

## 2020-10-29 ENCOUNTER — Telehealth: Payer: Self-pay | Admitting: Cardiology

## 2020-10-29 MED ORDER — VALSARTAN 160 MG PO TABS
320.0000 mg | ORAL_TABLET | Freq: Every day | ORAL | 2 refills | Status: DC
Start: 2020-10-29 — End: 2021-02-16

## 2020-10-29 NOTE — Telephone Encounter (Signed)
*  STAT* If patient is at the pharmacy, call can be transferred to refill team.   1. Which medications need to be refilled? (please list name of each medication and dose if known)  valsartan (DIOVAN) 160 MG tablet  2. Which pharmacy/location (including street and city if local pharmacy) is medication to be sent to? [facility] [id number] - [place], [address]  3. Do they need a 30 day or 90 day supply?  90 day supply  Patient is requesting to have her medication transferred from [facility] to the pharmacy listed above. Please assist.

## 2020-11-03 ENCOUNTER — Telehealth: Payer: Self-pay | Admitting: Pulmonary Disease

## 2020-11-03 MED ORDER — MONTELUKAST SODIUM 10 MG PO TABS
10.0000 mg | ORAL_TABLET | Freq: Every day | ORAL | 1 refills | Status: DC
Start: 2020-11-03 — End: 2021-05-13

## 2020-11-03 NOTE — Telephone Encounter (Signed)
I have sent the rx for the singulair into the pharmacy and nothing further is needed.

## 2020-11-16 DIAGNOSIS — I10 Essential (primary) hypertension: Secondary | ICD-10-CM | POA: Diagnosis not present

## 2020-11-16 DIAGNOSIS — Z23 Encounter for immunization: Secondary | ICD-10-CM | POA: Diagnosis not present

## 2020-11-16 DIAGNOSIS — L989 Disorder of the skin and subcutaneous tissue, unspecified: Secondary | ICD-10-CM | POA: Diagnosis not present

## 2020-11-16 DIAGNOSIS — M5030 Other cervical disc degeneration, unspecified cervical region: Secondary | ICD-10-CM | POA: Diagnosis not present

## 2020-11-16 DIAGNOSIS — J449 Chronic obstructive pulmonary disease, unspecified: Secondary | ICD-10-CM | POA: Diagnosis not present

## 2020-11-16 DIAGNOSIS — F319 Bipolar disorder, unspecified: Secondary | ICD-10-CM | POA: Diagnosis not present

## 2020-11-16 DIAGNOSIS — E785 Hyperlipidemia, unspecified: Secondary | ICD-10-CM | POA: Diagnosis not present

## 2020-11-18 ENCOUNTER — Telehealth: Payer: Self-pay

## 2020-11-18 DIAGNOSIS — I10 Essential (primary) hypertension: Secondary | ICD-10-CM | POA: Diagnosis not present

## 2020-11-18 DIAGNOSIS — Z6831 Body mass index (BMI) 31.0-31.9, adult: Secondary | ICD-10-CM | POA: Diagnosis not present

## 2020-11-18 DIAGNOSIS — G959 Disease of spinal cord, unspecified: Secondary | ICD-10-CM | POA: Diagnosis not present

## 2020-11-18 DIAGNOSIS — M542 Cervicalgia: Secondary | ICD-10-CM | POA: Diagnosis not present

## 2020-11-18 NOTE — Telephone Encounter (Signed)
Okay given to extend in-home physical therapy once a week for 8 weeks. Verbal okay given to [facility] PT phone [phone].

## 2020-11-19 ENCOUNTER — Other Ambulatory Visit: Payer: Self-pay | Admitting: Family Medicine

## 2020-11-19 DIAGNOSIS — Z1231 Encounter for screening mammogram for malignant neoplasm of breast: Secondary | ICD-10-CM

## 2020-11-26 DIAGNOSIS — M5459 Other low back pain: Secondary | ICD-10-CM | POA: Diagnosis not present

## 2020-11-26 DIAGNOSIS — M5416 Radiculopathy, lumbar region: Secondary | ICD-10-CM | POA: Diagnosis not present

## 2020-11-26 DIAGNOSIS — Z79899 Other long term (current) drug therapy: Secondary | ICD-10-CM | POA: Diagnosis not present

## 2020-11-26 DIAGNOSIS — G894 Chronic pain syndrome: Secondary | ICD-10-CM | POA: Diagnosis not present

## 2020-11-26 DIAGNOSIS — Z5181 Encounter for therapeutic drug level monitoring: Secondary | ICD-10-CM | POA: Diagnosis not present

## 2020-11-26 DIAGNOSIS — G8929 Other chronic pain: Secondary | ICD-10-CM | POA: Diagnosis not present

## 2020-11-30 ENCOUNTER — Other Ambulatory Visit: Payer: Self-pay

## 2020-11-30 ENCOUNTER — Telehealth: Payer: Self-pay | Admitting: Physical Medicine and Rehabilitation

## 2020-11-30 ENCOUNTER — Encounter
Payer: Medicare Other | Attending: Physical Medicine and Rehabilitation | Admitting: Physical Medicine and Rehabilitation

## 2020-11-30 ENCOUNTER — Encounter: Payer: Self-pay | Admitting: Physical Medicine and Rehabilitation

## 2020-11-30 VITALS — BP 172/79 | HR 66 | Temp 98.1°F | Ht 63.0 in | Wt 177.4 lb

## 2020-11-30 DIAGNOSIS — G8921 Chronic pain due to trauma: Secondary | ICD-10-CM | POA: Diagnosis not present

## 2020-11-30 DIAGNOSIS — G243 Spasmodic torticollis: Secondary | ICD-10-CM | POA: Diagnosis not present

## 2020-11-30 DIAGNOSIS — R269 Unspecified abnormalities of gait and mobility: Secondary | ICD-10-CM | POA: Insufficient documentation

## 2020-11-30 DIAGNOSIS — M7918 Myalgia, other site: Secondary | ICD-10-CM | POA: Diagnosis not present

## 2020-11-30 NOTE — Telephone Encounter (Signed)
Patient wanted to let Dr. 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  She wanted to thank you so much.

## 2020-11-30 NOTE — Patient Instructions (Addendum)
  Plan: 1 I suggest he tries the Fentanyl patch for pain- since cannot take long acting Dilaudid and allergic to Morphine-  For chronic pain- suggest a square large bandage to cover it so it doesn't come off. Would likely benefit from 12.5 mcg q3 days.    2.   Needs Botox for cervical dystonia- anterior tilt of head- cannot get head into cervical extension at all- If Dr Nelva Bush doesn't do, let us know and can have my partner do -Dr Letta Pate.   3. Patient here for trigger point injections for  Consent done and on chart.  Cleaned areas with alcohol and injected using a 27 gauge 1.5 inch needle  Injected 6cc Using 1% Lidocaine with no EPI  Upper traps B/L  Levators- B/L  Posterior scalenes- B/L  Middle scalenes- B/L  Splenius Capitus- B/L x2 Pectoralis Major Rhomboids Infraspinatus Teres Major/minor Thoracic paraspinals Lumbar paraspinals Other injections-    Patient's level of pain prior was 10/10 Current level of pain after injections is 7/10 already after injections ~ 1 minute  There was no bleeding or complications.  Patient was advised to drink a lot of water on day after injections to flush system Will have increased soreness for 12-48 hours after injections.  Can use Lidocaine patches the day AFTER injections Can use theracane on day of injections in places didn't inject Can use heating pad 4-6 hours AFTER injections   4. F/U -will put on wait list for trigger point injections and f/u in 3 months for SCI. Let me know if Dr Nelva Bush doesn't do Botox- might need Dysport because gets Botox for jumping in side of face-  cervical dystonia specifically- and will schedule with my partner.   5. Theracane- 2-4 minutes PRESSURE not massage on tight muscles- youtube has great videos- can use smart phone!

## 2020-11-30 NOTE — Telephone Encounter (Signed)
that's awesome- thanks=ML

## 2020-11-30 NOTE — Progress Notes (Signed)
Pt is a 75 yr old female with hx of severe spondylitis myelopathy with severe stenosis S/P C2-4 cervical arthrodesis 08/28/20- with central cord syndrome.   Has severe neck pain- here for f/u and trigger point injections.   Got some trigger point injections from Dr Nelva Bush' [place] gave them- at the end of September- and lasted ~ 3 weeks.    Can't get Oxycontin anymore- replaced it with Dilaudid ER- gives her horrible HA's, so cannot take. Just got Thursday/Friday- hadn't had a HA prior to taking hydromorphone!  Exam: Awake, alert, appropriate, accompanied by caretaker; head in cervical flexion and anterior push forward/tilt- literally canNOT get into neutral much less any cervical extension.  Associated trigger points in splenius capitus, levators, scalenes middle and posterior and upper traps B/L    Plan: 1 I suggest he tries the Fentanyl patch for pain- since cannot take long acting Dilaudid and allergic to Morphine-  For chronic pain- suggest a square large bandage to cover it so it doesn't come off. Would likely benefit from 12.5 mcg q3 days.    2.   Needs Botox for cervical dystonia- anterior tilt of head- cannot get head into cervical extension at all- If Dr Nelva Bush doesn't do, let us know and can have my partner do -Dr Letta Pate.   3. Patient here for trigger point injections for  Consent done and on chart.  Cleaned areas with alcohol and injected using a 27 gauge 1.5 inch needle  Injected 6cc Using 1% Lidocaine with no EPI  Upper traps B/L  Levators- B/L  Posterior scalenes- B/L  Middle scalenes- B/L  Splenius Capitus- B/L x2 Pectoralis Major Rhomboids Infraspinatus Teres Major/minor Thoracic paraspinals Lumbar paraspinals Other injections-    Patient's level of pain prior was 10/10 Current level of pain after injections is 7/10 already after injections ~ 1 minute  There was no bleeding or complications.  Patient was advised to drink a lot of water on day after  injections to flush system Will have increased soreness for 12-48 hours after injections.  Can use Lidocaine patches the day AFTER injections Can use theracane on day of injections in places didn't inject Can use heating pad 4-6 hours AFTER injections   4. F/U -will put on wait list for trigger point injections and f/u in 3 months for SCI. And will let me know if need Dr Letta Pate needs to do Botox.  might need Dysport because gets Botox for jumping in side of face- doesn't know dosage.

## 2020-12-01 ENCOUNTER — Telehealth: Payer: Self-pay | Admitting: Physical Medicine and Rehabilitation

## 2020-12-01 NOTE — Telephone Encounter (Signed)
Patient called to let Dr. Dagoberto Ligas know that her doctor doesn't;'t do Botox injections.  I have set her up with Dr. Letta Pate on 01/29/21.

## 2020-12-16 DIAGNOSIS — Z23 Encounter for immunization: Secondary | ICD-10-CM | POA: Diagnosis not present

## 2020-12-21 DIAGNOSIS — R140 Abdominal distension (gaseous): Secondary | ICD-10-CM | POA: Diagnosis not present

## 2020-12-21 DIAGNOSIS — R1011 Right upper quadrant pain: Secondary | ICD-10-CM | POA: Diagnosis not present

## 2020-12-21 DIAGNOSIS — G518 Other disorders of facial nerve: Secondary | ICD-10-CM | POA: Diagnosis not present

## 2020-12-23 ENCOUNTER — Ambulatory Visit (INDEPENDENT_AMBULATORY_CARE_PROVIDER_SITE_OTHER): Payer: Medicare Other | Admitting: Pulmonary Disease

## 2020-12-23 ENCOUNTER — Encounter: Payer: Self-pay | Admitting: Pulmonary Disease

## 2020-12-23 ENCOUNTER — Other Ambulatory Visit: Payer: Self-pay

## 2020-12-23 VITALS — BP 146/98 | HR 77 | Temp 99.4°F | Ht 63.0 in | Wt 180.8 lb

## 2020-12-23 DIAGNOSIS — I5032 Chronic diastolic (congestive) heart failure: Secondary | ICD-10-CM | POA: Diagnosis not present

## 2020-12-23 DIAGNOSIS — J9611 Chronic respiratory failure with hypoxia: Secondary | ICD-10-CM

## 2020-12-23 DIAGNOSIS — Z87891 Personal history of nicotine dependence: Secondary | ICD-10-CM

## 2020-12-23 DIAGNOSIS — J432 Centrilobular emphysema: Secondary | ICD-10-CM

## 2020-12-23 DIAGNOSIS — R9389 Abnormal findings on diagnostic imaging of other specified body structures: Secondary | ICD-10-CM

## 2020-12-23 MED ORDER — ALBUTEROL SULFATE (2.5 MG/3ML) 0.083% IN NEBU
2.5000 mg | INHALATION_SOLUTION | Freq: Four times a day (QID) | RESPIRATORY_TRACT | 12 refills | Status: DC | PRN
Start: 2020-12-23 — End: 2021-06-23

## 2020-12-23 NOTE — Progress Notes (Signed)
Synopsis: Referred in December 2021 for establish care with new primary pulmonologist, lung nodule, PCP: By Harlan Stains, MD  Subjective:   PATIENT ID: [name] GENDER: female DOB: [birth year], MRN: [id number]  Chief Complaint  Patient presents with   Follow-up    Follow up    This 75 year old female, history of coronary artery disease, longstanding history of smoking, history of renal cell carcinoma, hypertension.  Patient enrolled in our lung cancer screening program had abnormal lung cancer screening CT.  Prior pulmonary function tests with mild obstruction.  CT imaging though has severe emphysema.  06/25/2020 OV: Here today for follow-up for her severe emphysema as well as lung nodule.  She had a repeat noncontrasted CT scan of the chest in February9/20/2022: Here today for follow-up of 2022.  This revealed complete resolution of the small 7.8 mm nodule that was seen in October.  Suspected to be an inflammatory problem.  She is going to plan to reenroll in her annual lung cancer screening program in February 2023.  From a respiratory standpoint she has been doing well on her new triple therapy inhaler regimen and.  She would like to stay on this.  No refills needed at this time.  She is present today in the office with her caregiver.  She is breathing better.  She does use her nebulizer twice a day.  Unfortunately she did have a recent exacerbation that required hospitalization.  She was treated with antibiotics and steroids in May.  OV 12/23/2020: Here today for follow-up regarding COPD.  Currently managed with triple therapy inhaler, Breztri.  Additionally using as needed albuterol.  She usually uses her albuterol nebulizer at least twice a day.  She is able to get out of the house and do things that she needs to do.  Albeit a little bit slow.  She does feel short of breath with exertion still.  She did recently underwent a spine surgery.  She is recovering from this she does need  refills today of her albuterol.  She knows that she has a planned lung cancer screening CT in February 2023.   Past Medical History:  Diagnosis Date   Alcoholism ([place])    recovering since 2000   Anxiety and depression    Arthritis BACK   Bipolar disorder ([place])    Blepharospasm LEFT EYE   CHF exacerbation ([place]) 02/24/2017   Chronic back pain    COPD (chronic obstructive pulmonary disease) (HCC)    Coronary artery disease CARDIOLOGIST- DR [place]--- LAST VISIT NOTE 09-07-2009  W/ CHART   Emphysema    GERD (gastroesophageal reflux disease)    History of alcohol abuse RECOVERING SINCE 2000   History of Left renal mass 03/02/2012   underwent partial nephrectomy   HTN (hypertension)    Idiopathic acute facial nerve palsy LEFT SIDE--  BOTOX THERAPY   Inferior MI ([place]) 2000--  POST PTCA W/ STENT X1   Osteoporosis    Other and unspecified general anesthetics causing adverse effect in therapeutic use post op delirium--  last anes record w/ chart  from   09-22-2009 (spinal w/ light sedation)   Peripheral vascular disease ([place]) POST RIGHT CAROTID SURG.  1995   Renal cell carcinoma ([place]) 07/09/12   Left mass   Rosacea LEFT FACIAL RASH   S/P radiation therapy 02/21/2012   38.75 Gy HDR 5 Fractions- vaginal cuff   Scoliosis    Seizures (HCC)    x 1 after abrupt discontinuation of  Clonidine  Status post carotid endarterectomy RIGHT --  1995   Status post primary angioplasty with coronary stent 2000--  POST INFERIOR MI   Unstable balance WALKS W/ CANE   Vaginal cancer (HCC)      Family History  Problem Relation Age of Onset   Hypertension Mother    Hypertension Father    Cancer Maternal Grandfather        type unknown   Hypertension Sister      Past Surgical History:  Procedure Laterality Date   BREAST EXCISIONAL BIOPSY Left 2019   b9 axilla Bx X 3   CAROTID ENDARTERECTOMY  1995   RIGHT   CATARACT EXTRACTION W/ INTRAOCULAR LENS  IMPLANT, BILATERAL     CERVICAL CONIZATION W/BX   09-23-2008   CORONARY ANGIOPLASTY WITH STENT PLACEMENT  2000-   INFERIOR MI   X1 STENT TO RCA   EUS N/A 03/05/2012   Procedure: FULL UPPER ENDOSCOPIC ULTRASOUND (EUS) RADIAL and EGD;  Surgeon: Milus Banister, MD;  Location: [facility];  Service: Endoscopy;  Laterality: N/A;  ercp scope first than eus scope   HEMIARTHROPLASTY HIP  12-26-2008   LEFT FEMORAL NECK FX   ORIF HIP FRACTURE  02-13-2007   RIGHT FEMORAL NECK FX   ORIF RIGHT DISTAL RADIUS AND RIGHT PROXIMAL HUMEROUS NECK FX'S  10-10-2005   POSTERIOR CERVICAL LAMINECTOMY N/A 08/28/2020   Procedure: Laminectomy and Foraminotomy - Cervical Two-Three, Cervical Three-Four, with lateral mass fusion/ fixation;  Surgeon: Dawley, Theodoro Doing, DO;  Location: [facility];  Service: Neurosurgery;  Laterality: N/A;   RIGHT SHOULDER SURG.  2007   ROBOTIC ASSITED PARTIAL NEPHRECTOMY Left 07/09/2012   Procedure: ROBOTIC ASSITED PARTIAL NEPHRECTOMY;  Surgeon: Dutch Gray, MD;  Location: [facility];  Service: Urology;  Laterality: Left;   TOTAL HIP ARTHROPLASTY  04-15-2008   POST FAILED  RIGHT HIP ORIF FEMORAL FX   TOTAL KNEE ARTHROPLASTY  09-22-2009   RIGHT   UPPER RIGHT VAGINAL REGION  12/28/11   BIOPSY: SQUAMOUS CELL CARCINOMA   VAGINAL HYSTERECTOMY  07/06/2009   Secondary to dysplasia    Social History   Socioeconomic History   Marital status: Divorced    Spouse name: Not on file   Number of children: 0   Years of education: Not on file   Highest education level: Not on file  Occupational History   Occupation: retired  Tobacco Use   Smoking status: Former    Packs/day: 2.00    Years: 50.00    Pack years: 100.00    Types: Cigarettes    Quit date: 01/17/2010    Years since quitting: 10.9   Smokeless tobacco: Never   Tobacco comments:    STATES QUIT SMOKING 01-17-2010  Vaping Use   Vaping Use: Never used  Substance and Sexual Activity   Alcohol use: Not Currently    Comment: RECOVERING ALCOHOLIC--   QUIT IN 9417   Drug use: No   Sexual  activity: Never  Other Topics Concern   Not on file  Social History Narrative   Not on file   Social Determinants of Health   Financial Resource Strain: Not on file  Food Insecurity: Not on file  Transportation Needs: Not on file  Physical Activity: Not on file  Stress: Not on file  Social Connections: Not on file  Intimate Partner Violence: Not on file     Allergies  Allergen Reactions   Doxycycline Diarrhea   Gabapentin Other (See Comments)    sleepiness   Morphine And  Related Anxiety    Was confused when taking it     Outpatient Medications Prior to Visit  Medication Sig Dispense Refill   acetaminophen (TYLENOL) 325 MG tablet Take 2 tablets (650 mg total) by mouth every 4 (four) hours as needed for mild pain ((score 1 to 3) or temp > 100.5).     albuterol (VENTOLIN HFA) 108 (90 Base) MCG/ACT inhaler Inhale 2 puffs into the lungs every 6 (six) hours as needed for wheezing. 18 g 1   amlodipine-atorvastatin (CADUET) 10-20 MG tablet Take 1 tablet by mouth daily.     ARIPiprazole (ABILIFY) 10 MG tablet Take 1 tablet (10 mg total) by mouth daily. 30 tablet 0   aspirin EC 81 MG tablet Take 81 mg by mouth every morning.     Budeson-Glycopyrrol-Formoterol (BREZTRI AEROSPHERE) 160-9-4.8 MCG/ACT AERO Inhale 2 puffs into the lungs in the morning and at bedtime. 10.7 g 5   Calcium Carb-Cholecalciferol 600-400 MG-UNIT TABS Take 1 tablet by mouth daily. 60 tablet 0   celecoxib (CELEBREX) 200 MG capsule Take 200 mg by mouth daily.     Cholecalciferol (VITAMIN D3) 50 MCG (2000 UT) TABS Take 2,000 Units by mouth daily. 30 tablet 0   denosumab (PROLIA) 60 MG/ML SOSY injection Inject 60 mg into the skin every 6 (six) months.     dexlansoprazole (DEXILANT) 60 MG capsule Take 1 capsule (60 mg total) by mouth daily. 30 capsule 0   docusate sodium (COLACE) 100 MG capsule Take 100 mg by mouth daily as needed for mild constipation.     ferrous sulfate 325 (65 FE) MG tablet Take 1 tablet (325 mg  total) by mouth daily. 30 tablet 3   furosemide (LASIX) 20 MG tablet Take 1 tablet (20 mg total) by mouth daily. 30 tablet 3   hydrALAZINE (APRESOLINE) 25 MG tablet Take 1 tablet (25 mg total) by mouth 2 (two) times daily. 60 tablet 6   lamoTRIgine (LAMICTAL) 100 MG tablet Take 2 tablets (200 mg total) by mouth 2 (two) times daily. 120 tablet 0   montelukast (SINGULAIR) 10 MG tablet Take 1 tablet (10 mg total) by mouth at bedtime. TAKE 1 TABLET BY MOUTH EVERYDAY AT BEDTIME 90 tablet 1   Multiple Vitamin (MULTIVITAMIN) capsule Take 1 capsule by mouth daily.      nitroGLYCERIN (NITROSTAT) 0.4 MG SL tablet Place 1 tablet (0.4 mg total) under the tongue every 5 (five) minutes as needed for chest pain. 25 tablet 3   Omega-3 Fatty Acids (FISH OIL) 1000 MG CAPS Take 1,000 mg by mouth 2 (two) times daily.     Oxycodone HCl 10 MG TABS Take 1 tablet (10 mg total) by mouth every 4 (four) hours as needed for severe pain. 30 tablet 0   Polyethyl Glycol-Propyl Glycol (SYSTANE) 0.4-0.3 % GEL ophthalmic gel Place 1 application into both eyes daily as needed (dry eyes).     potassium chloride (KLOR-CON) 8 MEQ tablet Take 1 tablet (8 mEq total) by mouth daily. 30 tablet 6   sertraline (ZOLOFT) 50 MG tablet Take 1 tablet (50 mg total) by mouth daily. 30 tablet 0   tiZANidine (ZANAFLEX) 4 MG tablet Take 1 tablet (4 mg total) by mouth every 6 (six) hours. 120 tablet 0   valsartan (DIOVAN) 160 MG tablet Take 2 tablets (320 mg total) by mouth daily. 90 tablet 2   amLODipine (NORVASC) 5 MG tablet Take 1 tablet (5 mg total) by mouth daily. (Patient not taking: Reported on 10/26/2020) 30  tablet 0   atorvastatin (LIPITOR) 20 MG tablet Take 1 tablet (20 mg total) by mouth daily. (Patient not taking: Reported on 11/30/2020) 30 tablet 0   linaclotide (LINZESS) 145 MCG CAPS capsule Take 1 capsule (145 mcg total) by mouth daily before breakfast. (Patient not taking: Reported on 11/30/2020) 30 capsule 0   oxyCODONE (OXYCONTIN) 10  mg 12 hr tablet Take 1 tablet (10 mg total) by mouth every 12 (twelve) hours. (Patient not taking: Reported on 11/30/2020) 14 tablet 0   tamsulosin (FLOMAX) 0.4 MG CAPS capsule Take 1 capsule (0.4 mg total) by mouth daily after supper. (Patient not taking: Reported on 11/30/2020) 30 capsule 0   No facility-administered medications prior to visit.    Review of Systems  Constitutional:  Negative for chills, fever, malaise/fatigue and weight loss.  HENT:  Negative for hearing loss, sore throat and tinnitus.   Eyes:  Negative for blurred vision and double vision.  Respiratory:  Positive for shortness of breath. Negative for cough, hemoptysis, sputum production, wheezing and stridor.   Cardiovascular:  Negative for chest pain, palpitations, orthopnea, leg swelling and PND.  Gastrointestinal:  Negative for abdominal pain, constipation, diarrhea, heartburn, nausea and vomiting.  Genitourinary:  Negative for dysuria, hematuria and urgency.  Musculoskeletal:  Negative for joint pain and myalgias.  Skin:  Negative for itching and rash.  Neurological:  Negative for dizziness, tingling, weakness and headaches.  Endo/Heme/Allergies:  Negative for environmental allergies. Does not bruise/bleed easily.  Psychiatric/Behavioral:  Negative for depression. The patient is not nervous/anxious and does not have insomnia.   All other systems reviewed and are negative.   Objective:  Physical Exam Vitals reviewed.  Constitutional:      General: She is not in acute distress.    Appearance: She is well-developed.  HENT:     Head: Normocephalic and atraumatic.     Mouth/Throat:     Pharynx: No oropharyngeal exudate.  Eyes:     Conjunctiva/sclera: Conjunctivae normal.     Pupils: Pupils are equal, round, and reactive to light.  Neck:     Vascular: No JVD.     Trachea: No tracheal deviation.     Comments: Loss of supraclavicular fat Cardiovascular:     Rate and Rhythm: Normal rate and regular rhythm.      Heart sounds: S1 normal and S2 normal.     Comments: Distant heart tones Pulmonary:     Effort: No tachypnea or accessory muscle usage.     Breath sounds: No stridor. Decreased breath sounds (throughout all lung fields) present. No wheezing, rhonchi or rales.  Abdominal:     General: Bowel sounds are normal. There is no distension.     Palpations: Abdomen is soft.     Tenderness: There is no abdominal tenderness.  Musculoskeletal:        General: Deformity (muscle wasting ) present.     Comments: Kyphosis of the spine  Skin:    General: Skin is warm and dry.     Capillary Refill: Capillary refill takes less than 2 seconds.     Findings: No rash.  Neurological:     Mental Status: She is alert and oriented to person, place, and time.  Psychiatric:        Behavior: Behavior normal.     Vitals:   12/23/20 1338  BP: (!) 146/98  Pulse: 77  Temp: 99.4 F (37.4 C)  TempSrc: Oral  SpO2: 90%  Weight: 180 lb 12.8 oz (82 kg)  Height: 5'  3" (1.6 m)   90% on RA BMI Readings from Last 3 Encounters:  12/23/20 32.03 kg/m  11/30/20 31.42 kg/m  10/26/20 30.08 kg/m   Wt Readings from Last 3 Encounters:  12/23/20 180 lb 12.8 oz (82 kg)  11/30/20 177 lb 6.4 oz (80.5 kg)  10/26/20 169 lb 12.8 oz (77 kg)     CBC    Component Value Date/Time   WBC 8.4 09/03/2020 0500   RBC 4.04 09/03/2020 0500   HGB 13.1 09/03/2020 0500   HCT 38.7 09/03/2020 0500   PLT 270 09/03/2020 0500   MCV 95.8 09/03/2020 0500   MCH 32.4 09/03/2020 0500   MCHC 33.9 09/03/2020 0500   RDW 13.8 09/03/2020 0500   LYMPHSABS 0.9 09/03/2020 0500   MONOABS 0.8 09/03/2020 0500   EOSABS 0.2 09/03/2020 0500   BASOSABS 0.0 09/03/2020 0500    Chest Imaging: 10/24/2019: Lung cancer screening CT lung RADS 4 a suspicious with a new 7.8 mm left upper lobe pulmonary nodule. The patient's images have been independently reviewed by me.    February 2022 CT chest: 7.8 mm left upper lobe nodule completely resolved in  comparison.  Evidence of emphysema present. The patient's images have been independently reviewed by me.     Pulmonary Functions Testing Results: PFT Results Latest Ref Rng & Units 07/02/2019  FVC-Pre L 2.74  FVC-Predicted Pre % 100  FVC-Post L 2.71  FVC-Predicted Post % 99  Pre FEV1/FVC % % 68  Post FEV1/FCV % % 69  FEV1-Pre L 1.86  FEV1-Predicted Pre % 91  FEV1-Post L 1.87  DLCO uncorrected ml/min/mmHg 9.87  DLCO UNC% % 53  DLCO corrected ml/min/mmHg 9.87  DLCO COR %Predicted % 53  DLVA Predicted % 55  TLC L 4.52  TLC % Predicted % 92  RV % Predicted % 84    FeNO:   Pathology:   Echocardiogram:   Heart Catheterization:     Assessment & Plan:     ICD-10-CM   1. Centrilobular emphysema ([place])  J43.2     2. Chronic respiratory failure with hypoxia (HCC)  J96.11     3. Former smoker  Z87.891     4. Chronic diastolic CHF (congestive heart failure) (HCC)  I50.32     5. Abnormal screening CT of chest  R93.89       Discussion:  This is a 75 year old female, severe centrilobular emphysema, mild obstruction on PFTs, solitary pulmonary nodule in the left upper lobe that resolved on previous CT scan imaging.  She is enrolled in our lung cancer screening program with a planned repeat CT in February 2023.  Plan: Continue triple therapy inhaler regimen No refills of Breztri needed at this time. If she calls and okay to send refills for [facility] She does need refills of her albuterol nebulizer solution. These were given for the patient today. Continue to monitor O2 sats. Goal SPO2 greater than 88%, we discussed this today in the office. Continue albuterol for shortness of breath and wheezing, continue albuterol nebulizer twice a day.     Current Outpatient Medications:    acetaminophen (TYLENOL) 325 MG tablet, Take 2 tablets (650 mg total) by mouth every 4 (four) hours as needed for mild pain ((score 1 to 3) or temp > 100.5)., Disp: , Rfl:    albuterol (PROVENTIL)  (2.5 MG/3ML) 0.083% nebulizer solution, Take 3 mLs (2.5 mg total) by nebulization every 6 (six) hours as needed for wheezing or shortness of breath., Disp: 75 mL, Rfl: 12  albuterol (VENTOLIN HFA) 108 (90 Base) MCG/ACT inhaler, Inhale 2 puffs into the lungs every 6 (six) hours as needed for wheezing., Disp: 18 g, Rfl: 1   amlodipine-atorvastatin (CADUET) 10-20 MG tablet, Take 1 tablet by mouth daily., Disp: , Rfl:    ARIPiprazole (ABILIFY) 10 MG tablet, Take 1 tablet (10 mg total) by mouth daily., Disp: 30 tablet, Rfl: 0   aspirin EC 81 MG tablet, Take 81 mg by mouth every morning., Disp: , Rfl:    Budeson-Glycopyrrol-Formoterol (BREZTRI AEROSPHERE) 160-9-4.8 MCG/ACT AERO, Inhale 2 puffs into the lungs in the morning and at bedtime., Disp: 10.7 g, Rfl: 5   Calcium Carb-Cholecalciferol 600-400 MG-UNIT TABS, Take 1 tablet by mouth daily., Disp: 60 tablet, Rfl: 0   celecoxib (CELEBREX) 200 MG capsule, Take 200 mg by mouth daily., Disp: , Rfl:    Cholecalciferol (VITAMIN D3) 50 MCG (2000 UT) TABS, Take 2,000 Units by mouth daily., Disp: 30 tablet, Rfl: 0   denosumab (PROLIA) 60 MG/ML SOSY injection, Inject 60 mg into the skin every 6 (six) months., Disp: , Rfl:    dexlansoprazole (DEXILANT) 60 MG capsule, Take 1 capsule (60 mg total) by mouth daily., Disp: 30 capsule, Rfl: 0   docusate sodium (COLACE) 100 MG capsule, Take 100 mg by mouth daily as needed for mild constipation., Disp: , Rfl:    ferrous sulfate 325 (65 FE) MG tablet, Take 1 tablet (325 mg total) by mouth daily., Disp: 30 tablet, Rfl: 3   furosemide (LASIX) 20 MG tablet, Take 1 tablet (20 mg total) by mouth daily., Disp: 30 tablet, Rfl: 3   hydrALAZINE (APRESOLINE) 25 MG tablet, Take 1 tablet (25 mg total) by mouth 2 (two) times daily., Disp: 60 tablet, Rfl: 6   lamoTRIgine (LAMICTAL) 100 MG tablet, Take 2 tablets (200 mg total) by mouth 2 (two) times daily., Disp: 120 tablet, Rfl: 0   montelukast (SINGULAIR) 10 MG tablet, Take 1 tablet (10  mg total) by mouth at bedtime. TAKE 1 TABLET BY MOUTH EVERYDAY AT BEDTIME, Disp: 90 tablet, Rfl: 1   Multiple Vitamin (MULTIVITAMIN) capsule, Take 1 capsule by mouth daily. , Disp: , Rfl:    nitroGLYCERIN (NITROSTAT) 0.4 MG SL tablet, Place 1 tablet (0.4 mg total) under the tongue every 5 (five) minutes as needed for chest pain., Disp: 25 tablet, Rfl: 3   Omega-3 Fatty Acids (FISH OIL) 1000 MG CAPS, Take 1,000 mg by mouth 2 (two) times daily., Disp: , Rfl:    Oxycodone HCl 10 MG TABS, Take 1 tablet (10 mg total) by mouth every 4 (four) hours as needed for severe pain., Disp: 30 tablet, Rfl: 0   Polyethyl Glycol-Propyl Glycol (SYSTANE) 0.4-0.3 % GEL ophthalmic gel, Place 1 application into both eyes daily as needed (dry eyes)., Disp: , Rfl:    potassium chloride (KLOR-CON) 8 MEQ tablet, Take 1 tablet (8 mEq total) by mouth daily., Disp: 30 tablet, Rfl: 6   sertraline (ZOLOFT) 50 MG tablet, Take 1 tablet (50 mg total) by mouth daily., Disp: 30 tablet, Rfl: 0   tiZANidine (ZANAFLEX) 4 MG tablet, Take 1 tablet (4 mg total) by mouth every 6 (six) hours., Disp: 120 tablet, Rfl: 0   valsartan (DIOVAN) 160 MG tablet, Take 2 tablets (320 mg total) by mouth daily., Disp: 90 tablet, Rfl: 2   amLODipine (NORVASC) 5 MG tablet, Take 1 tablet (5 mg total) by mouth daily. (Patient not taking: Reported on 10/26/2020), Disp: 30 tablet, Rfl: 0   atorvastatin (LIPITOR) 20 MG  tablet, Take 1 tablet (20 mg total) by mouth daily. (Patient not taking: Reported on 11/30/2020), Disp: 30 tablet, Rfl: 0   linaclotide (LINZESS) 145 MCG CAPS capsule, Take 1 capsule (145 mcg total) by mouth daily before breakfast. (Patient not taking: Reported on 11/30/2020), Disp: 30 capsule, Rfl: 0   oxyCODONE (OXYCONTIN) 10 mg 12 hr tablet, Take 1 tablet (10 mg total) by mouth every 12 (twelve) hours. (Patient not taking: Reported on 11/30/2020), Disp: 14 tablet, Rfl: 0   tamsulosin (FLOMAX) 0.4 MG CAPS capsule, Take 1 capsule (0.4 mg total) by  mouth daily after supper. (Patient not taking: Reported on 11/30/2020), Disp: 30 capsule, Rfl: 0     Garner Nash, DO [facility] 12/23/2020 1:50 PM

## 2020-12-23 NOTE — Patient Instructions (Signed)
Thank you for visiting Dr. Valeta Harms at [facility]. Today we recommend the following:  Lung cancer screening CT in Feb 2023  Meds ordered this encounter  Medications   albuterol (PROVENTIL) (2.5 MG/3ML) 0.083% nebulizer solution    Sig: Take 3 mLs (2.5 mg total) by nebulization every 6 (six) hours as needed for wheezing or shortness of breath.    Dispense:  75 mL    Refill:  12   Return in about 1 year (around 12/23/2021) for with APP or Dr. Valeta Harms.    Please do your part to reduce the spread of COVID-19.

## 2020-12-23 NOTE — Progress Notes (Signed)
A 

## 2020-12-30 DIAGNOSIS — G959 Disease of spinal cord, unspecified: Secondary | ICD-10-CM | POA: Diagnosis not present

## 2020-12-30 DIAGNOSIS — M810 Age-related osteoporosis without current pathological fracture: Secondary | ICD-10-CM | POA: Diagnosis not present

## 2020-12-30 DIAGNOSIS — I10 Essential (primary) hypertension: Secondary | ICD-10-CM | POA: Diagnosis not present

## 2020-12-30 DIAGNOSIS — M542 Cervicalgia: Secondary | ICD-10-CM | POA: Diagnosis not present

## 2020-12-30 DIAGNOSIS — Z6831 Body mass index (BMI) 31.0-31.9, adult: Secondary | ICD-10-CM | POA: Diagnosis not present

## 2020-12-31 ENCOUNTER — Other Ambulatory Visit: Payer: Self-pay | Admitting: Neurosurgery

## 2020-12-31 DIAGNOSIS — G894 Chronic pain syndrome: Secondary | ICD-10-CM | POA: Diagnosis not present

## 2020-12-31 DIAGNOSIS — M542 Cervicalgia: Secondary | ICD-10-CM

## 2021-01-04 ENCOUNTER — Ambulatory Visit
Admission: RE | Admit: 2021-01-04 | Discharge: 2021-01-04 | Disposition: A | Discharge status: Home / Self Care | Payer: Medicare Other | Source: Ambulatory Visit | Attending: Family Medicine | Admitting: Family Medicine

## 2021-01-04 DIAGNOSIS — Z1231 Encounter for screening mammogram for malignant neoplasm of breast: Secondary | ICD-10-CM

## 2021-01-26 ENCOUNTER — Ambulatory Visit
Admission: RE | Admit: 2021-01-26 | Discharge: 2021-01-26 | Disposition: A | Discharge status: Home / Self Care | Payer: Medicare Other | Source: Ambulatory Visit | Attending: Neurosurgery | Admitting: Neurosurgery

## 2021-01-26 ENCOUNTER — Other Ambulatory Visit: Payer: Self-pay

## 2021-01-26 DIAGNOSIS — M542 Cervicalgia: Secondary | ICD-10-CM

## 2021-01-26 DIAGNOSIS — M2578 Osteophyte, vertebrae: Secondary | ICD-10-CM | POA: Diagnosis not present

## 2021-01-29 ENCOUNTER — Encounter: Payer: Medicare Other | Attending: Physical Medicine and Rehabilitation | Admitting: Physical Medicine & Rehabilitation

## 2021-01-29 ENCOUNTER — Encounter: Payer: Medicare Other | Admitting: Physical Medicine & Rehabilitation

## 2021-01-29 ENCOUNTER — Other Ambulatory Visit: Payer: Self-pay

## 2021-01-29 ENCOUNTER — Encounter: Payer: Self-pay | Admitting: Physical Medicine & Rehabilitation

## 2021-01-29 VITALS — BP 141/73 | HR 71 | Temp 99.7°F | Ht 63.0 in | Wt 186.2 lb

## 2021-01-29 DIAGNOSIS — R29898 Other symptoms and signs involving the musculoskeletal system: Secondary | ICD-10-CM | POA: Insufficient documentation

## 2021-01-29 NOTE — Patient Instructions (Signed)
Do not think Botox would be of much benfit in this situation and could potentially cause increased weakness

## 2021-01-29 NOTE — Progress Notes (Signed)
Established Patient Office Visit  Subjective:  Patient ID: [name], female    DOB: Mar 28, 1945  Age: 76 y.o. MRN: [id number]  CC: Neck pain Chief Complaint  Patient presents with   Botox Consult    HPI [name] presents for evaluation of botulinum toxin injection for cervical dystonia. The patient has a history of cervical stenosis status post cervical C2-C4 posterior spinal fusion on 08/28/2020.  The patient has had persistent neck pain.  Repeat cervical spine CT performed on 01/26/2021 demonstrated mild anterolisthesis that was stable compared to prior CT of the cervical spine performed in August 2022.   CT CERVICAL SPINE WITHOUT CONTRAST   TECHNIQUE: Multidetector CT imaging of the cervical spine was performed without intravenous contrast. Multiplanar CT image reconstructions were also generated.   RADIATION DOSE REDUCTION: This exam was performed according to the departmental dose-optimization program which includes automated exposure control, adjustment of the mA and/or kV according to patient size and/or use of iterative reconstruction technique.   COMPARISON:  Cervical spine MRI 08/25/2020. CT chest 03/02/2020.   FINDINGS: Alignment: Again seen is reversal of normal cervical lordosis. There is stable 2 mm of anterolisthesis at C3-C4 and C4-C5. Spinal alignment is otherwise within normal limits.   Skull base and vertebrae: There is no evidence for acute fracture. Posterior fusion hardware is again seen at L2 and L4. No evidence for hardware loosening.   Soft tissues and spinal canal: No prevertebral fluid or swelling. No visible canal hematoma.   Disc levels: There is stable mild to moderate intervertebral disc space narrowing throughout the cervical spine. Endplate osteophytes are seen at C4-C5, C5-C6, C6-C7 and C7-T1. Facet arthropathy is noted bilaterally at C2-C4. Bilateral neural foraminal stenosis at C3-C4 and C4-C5, left greater than right.  No significant central canal stenosis identified.   Upper chest: Negative.   Other: None.   IMPRESSION: 1. No acute bony abnormality. 2. Stable degenerative changes and mild anterolisthesis at C3-C4 and C4-C5. 3. Stable reversal of normal cervical lordosis. 4. Uncomplicated posterior fusion changes at C2-C4.     Electronically Signed   By: Ronney Asters M.D.   On: 01/26/2021 20:50   Past Medical History:  Diagnosis Date   Alcoholism ([place])    recovering since 2000   Anxiety and depression    Arthritis BACK   Bipolar disorder ([place])    Blepharospasm LEFT EYE   CHF exacerbation ([place]) 02/24/2017   Chronic back pain    COPD (chronic obstructive pulmonary disease) (HCC)    Coronary artery disease CARDIOLOGIST- DR [place]--- LAST VISIT NOTE 09-07-2009  W/ CHART   Emphysema    GERD (gastroesophageal reflux disease)    History of alcohol abuse RECOVERING SINCE 2000   History of Left renal mass 03/02/2012   underwent partial nephrectomy   HTN (hypertension)    Idiopathic acute facial nerve palsy LEFT SIDE--  BOTOX THERAPY   Inferior MI ([place]) 2000--  POST PTCA W/ STENT X1   Osteoporosis    Other and unspecified general anesthetics causing adverse effect in therapeutic use post op delirium--  last anes record w/ chart  from   09-22-2009 (spinal w/ light sedation)   Peripheral vascular disease ([place]) POST RIGHT CAROTID SURG.  1995   Renal cell carcinoma ([place]) 07/09/12   Left mass   Rosacea LEFT FACIAL RASH   S/P radiation therapy 02/21/2012   38.75 Gy HDR 5 Fractions- vaginal cuff   Scoliosis    Seizures (HCC)    x 1  after abrupt discontinuation of  Clonidine   Status post carotid endarterectomy RIGHT --  1995   Status post primary angioplasty with coronary stent 2000--  POST INFERIOR MI   Unstable balance WALKS W/ CANE   Vaginal cancer [facility])     Past Surgical History:  Procedure Laterality Date   BREAST EXCISIONAL BIOPSY Left 2019   b9 axilla Bx X 3   CAROTID ENDARTERECTOMY   1995   RIGHT   CATARACT EXTRACTION W/ INTRAOCULAR LENS  IMPLANT, BILATERAL     CERVICAL CONIZATION W/BX  09-23-2008   CORONARY ANGIOPLASTY WITH STENT PLACEMENT  2000-   INFERIOR MI   X1 STENT TO RCA   EUS N/A 03/05/2012   Procedure: FULL UPPER ENDOSCOPIC ULTRASOUND (EUS) RADIAL and EGD;  Surgeon: Milus Banister, MD;  Location: [facility];  Service: Endoscopy;  Laterality: N/A;  ercp scope first than eus scope   HEMIARTHROPLASTY HIP  12-26-2008   LEFT FEMORAL NECK FX   ORIF HIP FRACTURE  02-13-2007   RIGHT FEMORAL NECK FX   ORIF RIGHT DISTAL RADIUS AND RIGHT PROXIMAL HUMEROUS NECK FX'S  10-10-2005   POSTERIOR CERVICAL LAMINECTOMY N/A 08/28/2020   Procedure: Laminectomy and Foraminotomy - Cervical Two-Three, Cervical Three-Four, with lateral mass fusion/ fixation;  Surgeon: Dawley, Theodoro Doing, DO;  Location: [facility];  Service: Neurosurgery;  Laterality: N/A;   RIGHT SHOULDER SURG.  2007   ROBOTIC ASSITED PARTIAL NEPHRECTOMY Left 07/09/2012   Procedure: ROBOTIC ASSITED PARTIAL NEPHRECTOMY;  Surgeon: Dutch Gray, MD;  Location: [facility];  Service: Urology;  Laterality: Left;   TOTAL HIP ARTHROPLASTY  04-15-2008   POST FAILED  RIGHT HIP ORIF FEMORAL FX   TOTAL KNEE ARTHROPLASTY  09-22-2009   RIGHT   UPPER RIGHT VAGINAL REGION  12/28/11   BIOPSY: SQUAMOUS CELL CARCINOMA   VAGINAL HYSTERECTOMY  07/06/2009   Secondary to dysplasia    Family History  Problem Relation Age of Onset   Hypertension Mother    Hypertension Father    Cancer Maternal Grandfather        type unknown   Hypertension Sister     Social History   Socioeconomic History   Marital status: Divorced    Spouse name: Not on file   Number of children: 0   Years of education: Not on file   Highest education level: Not on file  Occupational History   Occupation: retired  Tobacco Use   Smoking status: Former    Packs/day: 2.00    Years: 50.00    Pack years: 100.00    Types: Cigarettes    Quit date: 01/17/2010    Years since  quitting: 11.0   Smokeless tobacco: Never   Tobacco comments:    STATES QUIT SMOKING 01-17-2010  Vaping Use   Vaping Use: Never used  Substance and Sexual Activity   Alcohol use: Not Currently    Comment: RECOVERING ALCOHOLIC--   QUIT IN 7124   Drug use: No   Sexual activity: Never  Other Topics Concern   Not on file  Social History Narrative   Not on file   Social Determinants of Health   Financial Resource Strain: Not on file  Food Insecurity: Not on file  Transportation Needs: Not on file  Physical Activity: Not on file  Stress: Not on file  Social Connections: Not on file  Intimate Partner Violence: Not on file    Outpatient Medications Prior to Visit  Medication Sig Dispense Refill   acetaminophen (TYLENOL) 325 MG tablet  Take 2 tablets (650 mg total) by mouth every 4 (four) hours as needed for mild pain ((score 1 to 3) or temp > 100.5).     albuterol (PROVENTIL) (2.5 MG/3ML) 0.083% nebulizer solution Take 3 mLs (2.5 mg total) by nebulization every 6 (six) hours as needed for wheezing or shortness of breath. 75 mL 12   albuterol (VENTOLIN HFA) 108 (90 Base) MCG/ACT inhaler Inhale 2 puffs into the lungs every 6 (six) hours as needed for wheezing. 18 g 1   amlodipine-atorvastatin (CADUET) 10-20 MG tablet Take 1 tablet by mouth daily.     ARIPiprazole (ABILIFY) 10 MG tablet Take 1 tablet (10 mg total) by mouth daily. 30 tablet 0   aspirin EC 81 MG tablet Take 81 mg by mouth every morning.     Budeson-Glycopyrrol-Formoterol (BREZTRI AEROSPHERE) 160-9-4.8 MCG/ACT AERO Inhale 2 puffs into the lungs in the morning and at bedtime. 10.7 g 5   Calcium Carb-Cholecalciferol 600-400 MG-UNIT TABS Take 1 tablet by mouth daily. 60 tablet 0   celecoxib (CELEBREX) 200 MG capsule Take 200 mg by mouth daily.     Cholecalciferol (VITAMIN D3) 50 MCG (2000 UT) TABS Take 2,000 Units by mouth daily. 30 tablet 0   denosumab (PROLIA) 60 MG/ML SOSY injection Inject 60 mg into the skin every 6 (six)  months.     dexlansoprazole (DEXILANT) 60 MG capsule Take 1 capsule (60 mg total) by mouth daily. 30 capsule 0   docusate sodium (COLACE) 100 MG capsule Take 100 mg by mouth daily as needed for mild constipation.     ferrous sulfate 325 (65 FE) MG tablet Take 1 tablet (325 mg total) by mouth daily. 30 tablet 3   furosemide (LASIX) 20 MG tablet Take 1 tablet (20 mg total) by mouth daily. 30 tablet 3   hydrALAZINE (APRESOLINE) 25 MG tablet Take 1 tablet (25 mg total) by mouth 2 (two) times daily. 60 tablet 6   lamoTRIgine (LAMICTAL) 100 MG tablet Take 2 tablets (200 mg total) by mouth 2 (two) times daily. 120 tablet 0   montelukast (SINGULAIR) 10 MG tablet Take 1 tablet (10 mg total) by mouth at bedtime. TAKE 1 TABLET BY MOUTH EVERYDAY AT BEDTIME 90 tablet 1   Multiple Vitamin (MULTIVITAMIN) capsule Take 1 capsule by mouth daily.      nitroGLYCERIN (NITROSTAT) 0.4 MG SL tablet Place 1 tablet (0.4 mg total) under the tongue every 5 (five) minutes as needed for chest pain. 25 tablet 3   Omega-3 Fatty Acids (FISH OIL) 1000 MG CAPS Take 1,000 mg by mouth 2 (two) times daily.     Oxycodone HCl 10 MG TABS Take 1 tablet (10 mg total) by mouth every 4 (four) hours as needed for severe pain. 30 tablet 0   Polyethyl Glycol-Propyl Glycol (SYSTANE) 0.4-0.3 % GEL ophthalmic gel Place 1 application into both eyes daily as needed (dry eyes).     potassium chloride (KLOR-CON) 8 MEQ tablet Take 1 tablet (8 mEq total) by mouth daily. 30 tablet 6   sertraline (ZOLOFT) 50 MG tablet Take 1 tablet (50 mg total) by mouth daily. 30 tablet 0   tiZANidine (ZANAFLEX) 4 MG tablet Take 1 tablet (4 mg total) by mouth every 6 (six) hours. 120 tablet 0   valsartan (DIOVAN) 160 MG tablet Take 2 tablets (320 mg total) by mouth daily. 90 tablet 2   amLODipine (NORVASC) 5 MG tablet Take 1 tablet (5 mg total) by mouth daily. (Patient not taking: Reported on 10/26/2020)  30 tablet 0   atorvastatin (LIPITOR) 20 MG tablet Take 1 tablet (20  mg total) by mouth daily. (Patient not taking: Reported on 11/30/2020) 30 tablet 0   linaclotide (LINZESS) 145 MCG CAPS capsule Take 1 capsule (145 mcg total) by mouth daily before breakfast. (Patient not taking: Reported on 11/30/2020) 30 capsule 0   oxyCODONE (OXYCONTIN) 10 mg 12 hr tablet Take 1 tablet (10 mg total) by mouth every 12 (twelve) hours. (Patient not taking: Reported on 11/30/2020) 14 tablet 0   tamsulosin (FLOMAX) 0.4 MG CAPS capsule Take 1 capsule (0.4 mg total) by mouth daily after supper. (Patient not taking: Reported on 11/30/2020) 30 capsule 0   No facility-administered medications prior to visit.    Allergies  Allergen Reactions   Doxycycline Diarrhea   Gabapentin Other (See Comments)    sleepiness   Morphine And Related Anxiety    Was confused when taking it    ROS Review of Systems    Objective:    Physical Exam Vitals and nursing note reviewed.  Constitutional:      Appearance: She is obese.  HENT:     Head: Normocephalic and atraumatic.  Eyes:     Extraocular Movements: Extraocular movements intact.     Conjunctiva/sclera: Conjunctivae normal.     Pupils: Pupils are equal, round, and reactive to light.  Neck:     Comments: Patient has essentially 0 cervical spine extension Cervical flexion is full, rotation is 25% of normal lateral bending is 25% of normal. Cervical passive range of motion she can get to a near neutral position with some support over the mandible.  Musculoskeletal:        General: Tenderness present.     Cervical back: Rigidity present.     Right lower leg: No edema.     Left lower leg: No edema.  Skin:    General: Skin is warm and dry.  Neurological:     Mental Status: She is alert.     Comments: Occasional blepharospasm left eye There is mild hypertonicity of the left levator scapula.  No increased tone in the sternocleidomastoid Motor strength in the upper limbs is 4/5 deltoid bicep tricep grip  Psychiatric:        Mood  and Affect: Mood normal.        Behavior: Behavior normal.  MSK wasting of posterior cervical musculature adjacent to the posterior midline cervical spine incision. Mild tenderness palpation over the left cervical paraspinal area.  BP (!) 141/73    Pulse 71    Temp 99.7 F (37.6 C)    Ht 5\' 3"  (1.6 m)    Wt 186 lb 3.2 oz (84.5 kg)    SpO2 93%    BMI 32.98 kg/m  Wt Readings from Last 3 Encounters:  01/29/21 186 lb 3.2 oz (84.5 kg)  12/23/20 180 lb 12.8 oz (82 kg)  11/30/20 177 lb 6.4 oz (80.5 kg)     Health Maintenance Due  Topic Date Due   Zoster Vaccines- Shingrix (2 of 2) 10/01/2017   COVID-19 Vaccine (4 - Booster for Moderna series) 01/27/2020    There are no preventive care reminders to display for this patient.   Lab Results  Component Value Date   WBC 8.4 09/03/2020   HGB 13.1 09/03/2020   HCT 38.7 09/03/2020   MCV 95.8 09/03/2020   PLT 270 09/03/2020   Lab Results  Component Value Date   NA 134 (L) 09/03/2020   K 3.7 09/03/2020  CO2 27 09/03/2020   GLUCOSE 114 (H) 09/03/2020   BUN 7 (L) 09/03/2020   CREATININE 0.68 09/03/2020   BILITOT 0.6 09/03/2020   ALKPHOS 61 09/03/2020   AST 20 09/03/2020   ALT 16 09/03/2020   PROT 5.9 (L) 09/03/2020   ALBUMIN 2.3 (L) 09/03/2020   CALCIUM 8.9 09/03/2020   ANIONGAP 10 09/03/2020   GFR 63.70 11/13/2019   Lab Results  Component Value Date   CHOL  12/25/2008    161        ATP III CLASSIFICATION:  <200     mg/dL   Desirable  200-239  mg/dL   Borderline High  >=240    mg/dL   High          Lab Results  Component Value Date   HDL 102 12/25/2008   Lab Results  Component Value Date   LDLCALC  12/25/2008    51        Total Cholesterol/HDL:CHD Risk Coronary Heart Disease Risk Table                     Men   Women  1/2 Average Risk   3.4   3.3  Average Risk       5.0   4.4  2 X Average Risk   9.6   7.1  3 X Average Risk  23.4   11.0        Use the calculated Patient Ratio above and the CHD Risk Table to  determine the patient's CHD Risk.        ATP III CLASSIFICATION (LDL):  <100     mg/dL   Optimal  100-129  mg/dL   Near or Above                    Optimal  130-159  mg/dL   Borderline  160-189  mg/dL   High  >190     mg/dL   Very High   Lab Results  Component Value Date   TRIG 39 12/25/2008   Lab Results  Component Value Date   CHOLHDL 1.6 12/25/2008   Lab Results  Component Value Date   HGBA1C 5.9 (H) 02/24/2017      Assessment & Plan:  #1.  Cervical pain and deformity.  She is status post C2-4 posterior fusion, recent repeat CT of the cervical spine shows no acute issues. The patient's primary issue is cervical extensor muscle weakness with dropped head.  She does not exhibit anterior cervical spinal hypertonicity therefore do not think botulinum toxin would be of benefit.  We also discussed that injection of the painful areas in the posterior cervical paraspinal area would likely cause additional extension weakness which would be counterproductive. In addition she is already getting Botox injection for blepharospasm and this would require same-day injections coordinating between the physicians however it is a moot point at this time. The patient will follow-up with Dr. Dagoberto Ligas as well as Dr. Nelva Bush and Dr. Reatha Armour Problem List Items Addressed This Visit   None   No orders of the defined types were placed in this encounter.   Follow-up: No follow-ups on file.    Charlett Blake, MD

## 2021-02-06 ENCOUNTER — Emergency Department (HOSPITAL_COMMUNITY): Payer: Medicare Other

## 2021-02-06 ENCOUNTER — Encounter (HOSPITAL_COMMUNITY): Payer: Self-pay

## 2021-02-06 ENCOUNTER — Inpatient Hospital Stay (HOSPITAL_COMMUNITY)
Admission: EM | Admit: 2021-02-06 | Discharge: 2021-02-09 | DRG: 280 | Disposition: A | Discharge status: Home / Self Care | Payer: Medicare Other | Attending: Family Medicine | Admitting: Family Medicine

## 2021-02-06 ENCOUNTER — Other Ambulatory Visit: Payer: Self-pay

## 2021-02-06 DIAGNOSIS — J9811 Atelectasis: Secondary | ICD-10-CM | POA: Diagnosis not present

## 2021-02-06 DIAGNOSIS — J189 Pneumonia, unspecified organism: Secondary | ICD-10-CM | POA: Diagnosis present

## 2021-02-06 DIAGNOSIS — I214 Non-ST elevation (NSTEMI) myocardial infarction: Principal | ICD-10-CM | POA: Diagnosis present

## 2021-02-06 DIAGNOSIS — J441 Chronic obstructive pulmonary disease with (acute) exacerbation: Secondary | ICD-10-CM | POA: Diagnosis present

## 2021-02-06 DIAGNOSIS — E785 Hyperlipidemia, unspecified: Secondary | ICD-10-CM | POA: Diagnosis present

## 2021-02-06 DIAGNOSIS — I252 Old myocardial infarction: Secondary | ICD-10-CM | POA: Diagnosis not present

## 2021-02-06 DIAGNOSIS — Z96641 Presence of right artificial hip joint: Secondary | ICD-10-CM | POA: Diagnosis present

## 2021-02-06 DIAGNOSIS — M4854XA Collapsed vertebra, not elsewhere classified, thoracic region, initial encounter for fracture: Secondary | ICD-10-CM | POA: Diagnosis present

## 2021-02-06 DIAGNOSIS — Z9981 Dependence on supplemental oxygen: Secondary | ICD-10-CM

## 2021-02-06 DIAGNOSIS — I255 Ischemic cardiomyopathy: Secondary | ICD-10-CM | POA: Diagnosis present

## 2021-02-06 DIAGNOSIS — Z85528 Personal history of other malignant neoplasm of kidney: Secondary | ICD-10-CM

## 2021-02-06 DIAGNOSIS — R079 Chest pain, unspecified: Secondary | ICD-10-CM | POA: Diagnosis not present

## 2021-02-06 DIAGNOSIS — J9621 Acute and chronic respiratory failure with hypoxia: Secondary | ICD-10-CM | POA: Diagnosis present

## 2021-02-06 DIAGNOSIS — R911 Solitary pulmonary nodule: Secondary | ICD-10-CM | POA: Diagnosis present

## 2021-02-06 DIAGNOSIS — G894 Chronic pain syndrome: Secondary | ICD-10-CM | POA: Diagnosis not present

## 2021-02-06 DIAGNOSIS — I700 Atherosclerosis of aorta: Secondary | ICD-10-CM | POA: Diagnosis not present

## 2021-02-06 DIAGNOSIS — J439 Emphysema, unspecified: Secondary | ICD-10-CM | POA: Diagnosis present

## 2021-02-06 DIAGNOSIS — E669 Obesity, unspecified: Secondary | ICD-10-CM | POA: Diagnosis not present

## 2021-02-06 DIAGNOSIS — I10 Essential (primary) hypertension: Secondary | ICD-10-CM | POA: Diagnosis present

## 2021-02-06 DIAGNOSIS — I5032 Chronic diastolic (congestive) heart failure: Secondary | ICD-10-CM | POA: Diagnosis present

## 2021-02-06 DIAGNOSIS — Z7982 Long term (current) use of aspirin: Secondary | ICD-10-CM

## 2021-02-06 DIAGNOSIS — Z743 Need for continuous supervision: Secondary | ICD-10-CM | POA: Diagnosis not present

## 2021-02-06 DIAGNOSIS — Z888 Allergy status to other drugs, medicaments and biological substances status: Secondary | ICD-10-CM

## 2021-02-06 DIAGNOSIS — R778 Other specified abnormalities of plasma proteins: Secondary | ICD-10-CM | POA: Diagnosis not present

## 2021-02-06 DIAGNOSIS — Z881 Allergy status to other antibiotic agents status: Secondary | ICD-10-CM | POA: Diagnosis not present

## 2021-02-06 DIAGNOSIS — Z20822 Contact with and (suspected) exposure to covid-19: Secondary | ICD-10-CM | POA: Diagnosis present

## 2021-02-06 DIAGNOSIS — F319 Bipolar disorder, unspecified: Secondary | ICD-10-CM | POA: Diagnosis present

## 2021-02-06 DIAGNOSIS — R0602 Shortness of breath: Secondary | ICD-10-CM | POA: Diagnosis not present

## 2021-02-06 DIAGNOSIS — J9601 Acute respiratory failure with hypoxia: Secondary | ICD-10-CM

## 2021-02-06 DIAGNOSIS — I5043 Acute on chronic combined systolic (congestive) and diastolic (congestive) heart failure: Secondary | ICD-10-CM | POA: Diagnosis present

## 2021-02-06 DIAGNOSIS — I5023 Acute on chronic systolic (congestive) heart failure: Secondary | ICD-10-CM

## 2021-02-06 DIAGNOSIS — Z85828 Personal history of other malignant neoplasm of skin: Secondary | ICD-10-CM

## 2021-02-06 DIAGNOSIS — R0902 Hypoxemia: Secondary | ICD-10-CM

## 2021-02-06 DIAGNOSIS — Z905 Acquired absence of kidney: Secondary | ICD-10-CM

## 2021-02-06 DIAGNOSIS — Z6832 Body mass index (BMI) 32.0-32.9, adult: Secondary | ICD-10-CM | POA: Diagnosis not present

## 2021-02-06 DIAGNOSIS — Z87891 Personal history of nicotine dependence: Secondary | ICD-10-CM

## 2021-02-06 DIAGNOSIS — R000 Tachycardia, unspecified: Secondary | ICD-10-CM | POA: Diagnosis not present

## 2021-02-06 DIAGNOSIS — I110 Hypertensive heart disease with heart failure: Secondary | ICD-10-CM | POA: Diagnosis not present

## 2021-02-06 DIAGNOSIS — R0689 Other abnormalities of breathing: Secondary | ICD-10-CM | POA: Diagnosis not present

## 2021-02-06 DIAGNOSIS — J9600 Acute respiratory failure, unspecified whether with hypoxia or hypercapnia: Secondary | ICD-10-CM | POA: Diagnosis not present

## 2021-02-06 DIAGNOSIS — I447 Left bundle-branch block, unspecified: Secondary | ICD-10-CM | POA: Diagnosis present

## 2021-02-06 DIAGNOSIS — I2510 Atherosclerotic heart disease of native coronary artery without angina pectoris: Secondary | ICD-10-CM | POA: Diagnosis present

## 2021-02-06 DIAGNOSIS — M810 Age-related osteoporosis without current pathological fracture: Secondary | ICD-10-CM | POA: Diagnosis present

## 2021-02-06 DIAGNOSIS — I5021 Acute systolic (congestive) heart failure: Secondary | ICD-10-CM | POA: Diagnosis not present

## 2021-02-06 DIAGNOSIS — M436 Torticollis: Secondary | ICD-10-CM | POA: Diagnosis present

## 2021-02-06 DIAGNOSIS — Z955 Presence of coronary angioplasty implant and graft: Secondary | ICD-10-CM

## 2021-02-06 DIAGNOSIS — Z885 Allergy status to narcotic agent status: Secondary | ICD-10-CM | POA: Diagnosis not present

## 2021-02-06 DIAGNOSIS — R0789 Other chest pain: Secondary | ICD-10-CM | POA: Diagnosis not present

## 2021-02-06 DIAGNOSIS — R0603 Acute respiratory distress: Secondary | ICD-10-CM

## 2021-02-06 DIAGNOSIS — I5042 Chronic combined systolic (congestive) and diastolic (congestive) heart failure: Secondary | ICD-10-CM | POA: Diagnosis present

## 2021-02-06 DIAGNOSIS — Z791 Long term (current) use of non-steroidal anti-inflammatories (NSAID): Secondary | ICD-10-CM

## 2021-02-06 DIAGNOSIS — I739 Peripheral vascular disease, unspecified: Secondary | ICD-10-CM | POA: Diagnosis not present

## 2021-02-06 DIAGNOSIS — Z7951 Long term (current) use of inhaled steroids: Secondary | ICD-10-CM

## 2021-02-06 DIAGNOSIS — J80 Acute respiratory distress syndrome: Secondary | ICD-10-CM | POA: Diagnosis not present

## 2021-02-06 DIAGNOSIS — K219 Gastro-esophageal reflux disease without esophagitis: Secondary | ICD-10-CM | POA: Diagnosis present

## 2021-02-06 DIAGNOSIS — Z96651 Presence of right artificial knee joint: Secondary | ICD-10-CM | POA: Diagnosis present

## 2021-02-06 DIAGNOSIS — I517 Cardiomegaly: Secondary | ICD-10-CM | POA: Diagnosis not present

## 2021-02-06 DIAGNOSIS — Z79899 Other long term (current) drug therapy: Secondary | ICD-10-CM

## 2021-02-06 HISTORY — DX: Ischemic cardiomyopathy: I25.5

## 2021-02-06 LAB — COMPREHENSIVE METABOLIC PANEL
ALT: 25 U/L (ref 0–44)
AST: 24 U/L (ref 15–41)
Albumin: 3.3 g/dL — ABNORMAL LOW (ref 3.5–5.0)
Alkaline Phosphatase: 111 U/L (ref 38–126)
Anion gap: 10 (ref 5–15)
BUN: 14 mg/dL (ref 8–23)
CO2: 22 mmol/L (ref 22–32)
Calcium: 8.9 mg/dL (ref 8.9–10.3)
Chloride: 106 mmol/L (ref 98–111)
Creatinine, Ser: 0.96 mg/dL (ref 0.44–1.00)
GFR, Estimated: >60 mL/min (ref >60)
Glucose, Bld: 148 mg/dL — ABNORMAL HIGH (ref 70–99)
Potassium: 4.5 mmol/L (ref 3.5–5.1)
Sodium: 138 mmol/L (ref 135–145)
Total Bilirubin: 0.5 mg/dL (ref 0.3–1.2)
Total Protein: 6.3 g/dL — ABNORMAL LOW (ref 6.5–8.1)

## 2021-02-06 LAB — BRAIN NATRIURETIC PEPTIDE: B Natriuretic Peptide: 172 pg/mL — ABNORMAL HIGH (ref 0.0–100.0)

## 2021-02-06 LAB — CBC
HCT: 41.8 % (ref 36.0–46.0)
Hemoglobin: 14.7 g/dL (ref 12.0–15.0)
MCH: 34.8 pg — ABNORMAL HIGH (ref 26.0–34.0)
MCHC: 35.2 g/dL (ref 30.0–36.0)
MCV: 98.8 fL (ref 80.0–100.0)
Platelets: 336 10*3/uL (ref 150–400)
RBC: 4.23 MIL/uL (ref 3.87–5.11)
RDW: 14 % (ref 11.5–15.5)
WBC: 15.3 10*3/uL — ABNORMAL HIGH (ref 4.0–10.5)
nRBC: 0 % (ref 0.0–0.2)

## 2021-02-06 LAB — RESP PANEL BY RT-PCR (FLU A&B, COVID) ARPGX2
Influenza A by PCR: NEGATIVE
Influenza B by PCR: NEGATIVE
SARS Coronavirus 2 by RT PCR: NEGATIVE

## 2021-02-06 LAB — TROPONIN I (HIGH SENSITIVITY)
Troponin I (High Sensitivity): 354 ng/L (ref <18)
Troponin I (High Sensitivity): 485 ng/L (ref <18)

## 2021-02-06 MED ORDER — AZITHROMYCIN 250 MG PO TABS: 500.0000 mg | ORAL_TABLET | Freq: Every day | ORAL | Status: DC

## 2021-02-06 MED ORDER — ASPIRIN EC 81 MG PO TBEC
81.0000 mg | DELAYED_RELEASE_TABLET | Freq: Every morning | ORAL | Status: DC
  Administered 2021-02-07: 81 mg via ORAL
  Filled 2021-02-06: qty 1

## 2021-02-06 MED ORDER — OXYCODONE HCL 5 MG PO TABS
10.0000 mg | ORAL_TABLET | Freq: Once | ORAL | Status: AC
  Administered 2021-02-06: 10 mg via ORAL
  Filled 2021-02-06: qty 2

## 2021-02-06 MED ORDER — LEVOFLOXACIN IN D5W 750 MG/150ML IV SOLN
750.0000 mg | INTRAVENOUS | Status: DC
  Administered 2021-02-06: 750 mg via INTRAVENOUS
  Filled 2021-02-06 (×2): qty 150

## 2021-02-06 MED ORDER — UMECLIDINIUM BROMIDE 62.5 MCG/ACT IN AEPB
1.0000 | INHALATION_SPRAY | Freq: Every day | RESPIRATORY_TRACT | Status: DC
  Administered 2021-02-08 – 2021-02-09 (×2): 1 via RESPIRATORY_TRACT
  Filled 2021-02-06: qty 7

## 2021-02-06 MED ORDER — AMLODIPINE BESYLATE 10 MG PO TABS
10.0000 mg | ORAL_TABLET | Freq: Every day | ORAL | Status: DC
  Administered 2021-02-06 – 2021-02-09 (×4): 10 mg via ORAL
  Filled 2021-02-06: qty 1
  Filled 2021-02-06: qty 2
  Filled 2021-02-06 (×2): qty 1

## 2021-02-06 MED ORDER — FUROSEMIDE 10 MG/ML IJ SOLN
80.0000 mg | Freq: Once | INTRAMUSCULAR | Status: AC
  Administered 2021-02-06: 80 mg via INTRAVENOUS
  Filled 2021-02-06: qty 8

## 2021-02-06 MED ORDER — MONTELUKAST SODIUM 10 MG PO TABS
10.0000 mg | ORAL_TABLET | Freq: Every day | ORAL | Status: DC
  Administered 2021-02-07 – 2021-02-08 (×3): 10 mg via ORAL
  Filled 2021-02-06 (×3): qty 1

## 2021-02-06 MED ORDER — IRBESARTAN 75 MG PO TABS
150.0000 mg | ORAL_TABLET | Freq: Every day | ORAL | Status: DC
  Administered 2021-02-06 – 2021-02-07 (×2): 150 mg via ORAL
  Filled 2021-02-06: qty 2
  Filled 2021-02-06: qty 1

## 2021-02-06 MED ORDER — METHYLPREDNISOLONE SODIUM SUCC 125 MG IJ SOLR
125.0000 mg | Freq: Two times a day (BID) | INTRAMUSCULAR | Status: DC
  Administered 2021-02-07 (×2): 125 mg via INTRAVENOUS
  Filled 2021-02-06 (×2): qty 2

## 2021-02-06 MED ORDER — ARIPIPRAZOLE 5 MG PO TABS
10.0000 mg | ORAL_TABLET | Freq: Every day | ORAL | Status: DC
  Administered 2021-02-06 – 2021-02-09 (×4): 10 mg via ORAL
  Filled 2021-02-06 (×3): qty 2
  Filled 2021-02-06: qty 1

## 2021-02-06 MED ORDER — HEPARIN (PORCINE) 25000 UT/250ML-% IV SOLN
850.0000 [IU]/h | INTRAVENOUS | Status: DC
  Administered 2021-02-06: 850 [IU]/h via INTRAVENOUS
  Filled 2021-02-06: qty 250

## 2021-02-06 MED ORDER — LAMOTRIGINE 100 MG PO TABS
200.0000 mg | ORAL_TABLET | Freq: Two times a day (BID) | ORAL | Status: DC
  Administered 2021-02-06 – 2021-02-09 (×6): 200 mg via ORAL
  Filled 2021-02-06 (×7): qty 2

## 2021-02-06 MED ORDER — IOHEXOL 300 MG/ML  SOLN
100.0000 mL | Freq: Once | INTRAMUSCULAR | Status: AC | PRN
  Administered 2021-02-06: 100 mL via INTRAVENOUS

## 2021-02-06 MED ORDER — SERTRALINE HCL 50 MG PO TABS
50.0000 mg | ORAL_TABLET | Freq: Every day | ORAL | Status: DC
Start: 2021-02-06 — End: 2021-02-09
  Administered 2021-02-06 – 2021-02-09 (×4): 50 mg via ORAL
  Filled 2021-02-06 (×4): qty 1

## 2021-02-06 MED ORDER — PANTOPRAZOLE SODIUM 40 MG PO TBEC
40.0000 mg | DELAYED_RELEASE_TABLET | Freq: Every day | ORAL | Status: DC
  Administered 2021-02-06 – 2021-02-09 (×4): 40 mg via ORAL
  Filled 2021-02-06 (×4): qty 1

## 2021-02-06 MED ORDER — BUDESON-GLYCOPYRROL-FORMOTEROL 160-9-4.8 MCG/ACT IN AERO: 2.0000 | INHALATION_SPRAY | RESPIRATORY_TRACT | Status: DC

## 2021-02-06 MED ORDER — HYDROMORPHONE HCL 1 MG/ML IJ SOLN
1.0000 mg | Freq: Once | INTRAMUSCULAR | Status: AC
Start: 2021-02-06 — End: 2021-02-06
  Administered 2021-02-06: 1 mg via INTRAVENOUS
  Filled 2021-02-06: qty 1

## 2021-02-06 MED ORDER — ACETAMINOPHEN 325 MG PO TABS: 650.0000 mg | ORAL_TABLET | ORAL | Status: DC | PRN

## 2021-02-06 MED ORDER — HEPARIN BOLUS VIA INFUSION
4000.0000 [IU] | Freq: Once | INTRAVENOUS | Status: AC
  Administered 2021-02-06: 4000 [IU] via INTRAVENOUS
  Filled 2021-02-06: qty 4000

## 2021-02-06 MED ORDER — HEPARIN (PORCINE) 25000 UT/250ML-% IV SOLN: 14.0000 [IU]/kg/h | INTRAVENOUS | Status: DC

## 2021-02-06 MED ORDER — HYDRALAZINE HCL 25 MG PO TABS
25.0000 mg | ORAL_TABLET | Freq: Two times a day (BID) | ORAL | Status: DC
  Administered 2021-02-06 – 2021-02-09 (×6): 25 mg via ORAL
  Filled 2021-02-06 (×6): qty 1

## 2021-02-06 MED ORDER — PREDNISONE 20 MG PO TABS: 40.0000 mg | ORAL_TABLET | Freq: Every day | ORAL | Status: DC

## 2021-02-06 MED ORDER — POTASSIUM CHLORIDE ER 10 MEQ PO TBCR
10.0000 meq | EXTENDED_RELEASE_TABLET | Freq: Every day | ORAL | Status: DC
  Administered 2021-02-06 – 2021-02-09 (×4): 10 meq via ORAL
  Filled 2021-02-06 (×7): qty 1

## 2021-02-06 MED ORDER — ALBUTEROL SULFATE (2.5 MG/3ML) 0.083% IN NEBU: 2.5000 mg | INHALATION_SOLUTION | Freq: Four times a day (QID) | RESPIRATORY_TRACT | Status: DC | PRN

## 2021-02-06 MED ORDER — OXYCODONE HCL 5 MG PO TABS
10.0000 mg | ORAL_TABLET | ORAL | Status: DC | PRN
  Administered 2021-02-06 – 2021-02-09 (×12): 10 mg via ORAL
  Filled 2021-02-06 (×12): qty 2

## 2021-02-06 MED ORDER — FLUTICASONE FUROATE-VILANTEROL 200-25 MCG/ACT IN AEPB
1.0000 | INHALATION_SPRAY | Freq: Every day | RESPIRATORY_TRACT | Status: DC
  Administered 2021-02-08 – 2021-02-09 (×2): 1 via RESPIRATORY_TRACT
  Filled 2021-02-06: qty 28

## 2021-02-06 MED ORDER — HEPARIN SODIUM (PORCINE) 5000 UNIT/ML IJ SOLN: 4000.0000 [IU] | Freq: Once | INTRAMUSCULAR | Status: DC

## 2021-02-06 MED ORDER — CELECOXIB 200 MG PO CAPS
400.0000 mg | ORAL_CAPSULE | Freq: Every day | ORAL | Status: DC
  Administered 2021-02-06 – 2021-02-09 (×4): 400 mg via ORAL
  Filled 2021-02-06: qty 1
  Filled 2021-02-06 (×4): qty 2

## 2021-02-06 MED ORDER — ENOXAPARIN SODIUM 40 MG/0.4ML IJ SOSY: 40.0000 mg | PREFILLED_SYRINGE | INTRAMUSCULAR | Status: DC

## 2021-02-06 MED ORDER — FERROUS SULFATE 325 (65 FE) MG PO TABS
325.0000 mg | ORAL_TABLET | Freq: Every day | ORAL | Status: DC
  Administered 2021-02-06 – 2021-02-09 (×4): 325 mg via ORAL
  Filled 2021-02-06 (×4): qty 1

## 2021-02-06 MED ORDER — POTASSIUM CHLORIDE ER 8 MEQ PO TBCR
8.0000 meq | EXTENDED_RELEASE_TABLET | Freq: Every day | ORAL | Status: DC
  Filled 2021-02-06: qty 1

## 2021-02-06 MED ORDER — ENOXAPARIN SODIUM 40 MG/0.4ML IJ SOSY
40.0000 mg | PREFILLED_SYRINGE | INTRAMUSCULAR | Status: DC
  Administered 2021-02-06 – 2021-02-08 (×2): 40 mg via SUBCUTANEOUS
  Filled 2021-02-06 (×2): qty 0.4

## 2021-02-06 MED ORDER — TIZANIDINE HCL 2 MG PO TABS
4.0000 mg | ORAL_TABLET | Freq: Four times a day (QID) | ORAL | Status: DC
  Administered 2021-02-06 – 2021-02-09 (×10): 4 mg via ORAL
  Filled 2021-02-06 (×10): qty 2
  Filled 2021-02-06: qty 1

## 2021-02-06 MED ORDER — AMLODIPINE-ATORVASTATIN 10-20 MG PO TABS: 1.0000 | ORAL_TABLET | Freq: Every day | ORAL | Status: DC

## 2021-02-06 MED ORDER — ALBUTEROL SULFATE HFA 108 (90 BASE) MCG/ACT IN AERS
2.0000 | INHALATION_SPRAY | RESPIRATORY_TRACT | Status: DC | PRN
  Administered 2021-02-06: 2 via RESPIRATORY_TRACT
  Filled 2021-02-06: qty 6.7

## 2021-02-06 MED ORDER — POLYVINYL ALCOHOL 1.4 % OP SOLN: 1.0000 [drp] | Freq: Every day | OPHTHALMIC | Status: DC | PRN

## 2021-02-06 MED ORDER — NITROGLYCERIN 0.4 MG SL SUBL: 0.4000 mg | SUBLINGUAL_TABLET | SUBLINGUAL | Status: DC | PRN

## 2021-02-06 MED ORDER — METHYLPREDNISOLONE SODIUM SUCC 125 MG IJ SOLR
125.0000 mg | Freq: Once | INTRAMUSCULAR | Status: DC
  Filled 2021-02-06: qty 2

## 2021-02-06 MED ORDER — ATORVASTATIN CALCIUM 10 MG PO TABS
20.0000 mg | ORAL_TABLET | Freq: Every day | ORAL | Status: DC
  Administered 2021-02-07 – 2021-02-09 (×4): 20 mg via ORAL
  Filled 2021-02-06 (×5): qty 2

## 2021-02-06 MED ORDER — DOCUSATE SODIUM 100 MG PO CAPS: 100.0000 mg | ORAL_CAPSULE | Freq: Every day | ORAL | Status: DC | PRN

## 2021-02-06 NOTE — ED Triage Notes (Addendum)
Pt arrived via [facility] from home for c/o SOB that started at 0700 this morning. When EMS arrived pt's initial bp 240/130, hr 140, Sa02 80% on 2L. Pt wear 2L 02 per [place] at baseline. EMS initially put pt on cpap and gave albuterol 10mg , atrovent 0.5mg  and solumedrol 125mg  IV, magnesium 2g IV. Per EMS pt's lung sounds initially was wheezes throughout and rhonchi in lower lobes. Pt is A&Ox4. RT at bedside. Pt eupneic at this time.

## 2021-02-06 NOTE — H&P (Signed)
History and Physical    [name]:[id number] DOB: 1945-05-02 DOA: 02/06/2021  PCP: Harlan Stains, MD  Patient coming from: home  I have personally briefly reviewed patient's old medical records in [place]  Chief Complaint: increased SOB  HPI: [name] is a 76 y.o. female with medical history significant of COPD/emphsema on home O2, CAD, renal cell carcinoma s/p nephrectomy. Patient brought in by EMS for respiratory distress.  Patient normally on 2 L of oxygen at home.  Patient states for the past 3 weeks she has been struggling with some increased cough some breathing difficulties and also bilateral chest wall pain.  Made worse with coughing.  EMS stated that oxygen saturation were 80% on her 2 L.  Patient was placed on high percent nonrebreather arrived here that way.  And patient now on high flow nasal cannula oxygen sats are 92%.  Patient does feel better.  Patient denies any fevers.  Temp upon arrival here was 98.5.  Respiratory rate was 24 blood pressure 110/69. Patient denies angina type chest pain.   ED Course: T 98.5  151/99 89  RR 20 VBG 7/52/35/7/56.6  WBC 15.3 w/ 77/10/9, Hgb 14.7. Troponnin #1` 354, #2 486, BNP 172, glucose 148. CT chest with atelectasis, interstitial changes PNA vs CHF. CXR diffuse bilateral interstial changes. EKG w/o acute changes  Review of Systems: As per HPI otherwise 10 point review of systems negative.    Past Medical History:  Diagnosis Date   (HFpEF) heart failure with preserved ejection fraction ([place]) 02/24/2017   Echo 05/2020: EF 55, severe inferior HK, mild LVH, Gr1 DD, normal RVSF, RVSP 32.4, mild BAE, mild MR, AV sclerosis without stenosis   Alcoholism ([place])    recovering since 2000   Anxiety and depression    Arthritis BACK   Bipolar disorder ([place])    Blepharospasm LEFT EYE   Chronic back pain    COPD (chronic obstructive pulmonary disease) ([place])    Coronary artery disease    s/p Inf MI in 2000 tx with PCI to RCA  // Myoview in 2019 with inf-lat scar and mild peri-infarct ischemia   Emphysema    GERD (gastroesophageal reflux disease)    History of alcohol abuse RECOVERING SINCE 2000   History of Left renal mass 03/02/2012   underwent partial nephrectomy   HTN (hypertension)    Idiopathic acute facial nerve palsy LEFT SIDE--  BOTOX THERAPY   Inferior MI ([place]) 2000--  POST PTCA W/ STENT X1   Ischemic cardiomyopathy    EF returned to normal   Osteoporosis    Other and unspecified general anesthetics causing adverse effect in therapeutic use post op delirium--  last anes record w/ chart  from   09-22-2009 (spinal w/ light sedation)   Peripheral vascular disease ([place]) POST RIGHT CAROTID SURG.  1995   Renal cell carcinoma ([place]) 07/09/2012   Left mass   Rosacea LEFT FACIAL RASH   S/P radiation therapy 02/21/2012   38.75 Gy HDR 5 Fractions- vaginal cuff   Scoliosis    Seizures (HCC)    x 1 after abrupt discontinuation of  Clonidine   Status post carotid endarterectomy RIGHT --  1995   Status post primary angioplasty with coronary stent 2000--  POST INFERIOR MI   Unstable balance WALKS W/ CANE   Vaginal cancer [facility])     Past Surgical History:  Procedure Laterality Date   BREAST EXCISIONAL BIOPSY Left 2019   b9 axilla Bx X 3  CAROTID ENDARTERECTOMY  1995   RIGHT   CATARACT EXTRACTION W/ INTRAOCULAR LENS  IMPLANT, BILATERAL     CERVICAL CONIZATION W/BX  09-23-2008   CORONARY ANGIOPLASTY WITH STENT PLACEMENT  2000-   INFERIOR MI   X1 STENT TO RCA   EUS N/A 03/05/2012   Procedure: FULL UPPER ENDOSCOPIC ULTRASOUND (EUS) RADIAL and EGD;  Surgeon: Milus Banister, MD;  Location: [facility];  Service: Endoscopy;  Laterality: N/A;  ercp scope first than eus scope   HEMIARTHROPLASTY HIP  12-26-2008   LEFT FEMORAL NECK FX   ORIF HIP FRACTURE  02-13-2007   RIGHT FEMORAL NECK FX   ORIF RIGHT DISTAL RADIUS AND RIGHT PROXIMAL HUMEROUS NECK FX'S  10-10-2005   POSTERIOR CERVICAL LAMINECTOMY N/A 08/28/2020    Procedure: Laminectomy and Foraminotomy - Cervical Two-Three, Cervical Three-Four, with lateral mass fusion/ fixation;  Surgeon: Dawley, Theodoro Doing, DO;  Location: [facility];  Service: Neurosurgery;  Laterality: N/A;   RIGHT SHOULDER SURG.  2007   ROBOTIC ASSITED PARTIAL NEPHRECTOMY Left 07/09/2012   Procedure: ROBOTIC ASSITED PARTIAL NEPHRECTOMY;  Surgeon: Dutch Gray, MD;  Location: [facility];  Service: Urology;  Laterality: Left;   TOTAL HIP ARTHROPLASTY  04-15-2008   POST FAILED  RIGHT HIP ORIF FEMORAL FX   TOTAL KNEE ARTHROPLASTY  09-22-2009   RIGHT   UPPER RIGHT VAGINAL REGION  12/28/11   BIOPSY: SQUAMOUS CELL CARCINOMA   VAGINAL HYSTERECTOMY  07/06/2009   Secondary to dysplasia   Soc Hx - married and long divorced. 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 Lives alone but has help.   reports that she quit smoking about 11 years ago. Her smoking use included cigarettes. She has a 100.00 pack-year smoking history. She has never used smokeless tobacco. She reports that she does not currently use alcohol. She reports that she does not use drugs.  Allergies  Allergen Reactions   Cephalexin     Other reaction(s): diarrhea   Doxycycline Diarrhea   Gabapentin Other (See Comments)    sleepiness   Morphine And Related Anxiety    Was confused when taking it    Family History  Problem Relation Age of Onset   Hypertension Mother    Hypertension Father    Cancer Maternal Grandfather        type unknown   Hypertension Sister     Prior to Admission medications   Medication Sig Start Date End Date Taking? Authorizing Provider  acetaminophen (TYLENOL) 325 MG tablet Take 2 tablets (650 mg total) by mouth every 4 (four) hours as needed for mild pain ((score 1 to 3) or temp > 100.5). 09/17/20  Yes Angiulli, Lavon Paganini, PA-C  albuterol (PROVENTIL) (2.5 MG/3ML) 0.083% nebulizer solution Take 3 mLs (2.5 mg total) by nebulization every 6 (six) hours as needed for wheezing or  shortness of breath. 12/23/20  Yes Icard, Bradley L, DO  albuterol (VENTOLIN HFA) 108 (90 Base) MCG/ACT inhaler Inhale 2 puffs into the lungs every 6 (six) hours as needed for wheezing. 09/17/20  Yes Angiulli, Lavon Paganini, PA-C  amlodipine-atorvastatin (CADUET) 10-20 MG tablet Take 1 tablet by mouth daily.   Yes [provider]  ARIPiprazole (ABILIFY) 10 MG tablet Take 1 tablet (10 mg total) by mouth daily. 09/17/20  Yes Angiulli, Lavon Paganini, PA-C  Ascorbic Acid (VITAMIN C) 1000 MG tablet Take 1,000 mg by mouth daily.   Yes [provider]  aspirin EC 81 MG tablet Take 81 mg by mouth every  morning.   Yes [provider]  Budeson-Glycopyrrol-Formoterol (BREZTRI AEROSPHERE) 160-9-4.8 MCG/ACT AERO Inhale 2 puffs into the lungs in the morning and at bedtime. 09/17/20  Yes Angiulli, Lavon Paganini, PA-C  Calcium Carb-Cholecalciferol 600-400 MG-UNIT TABS Take 1 tablet by mouth daily. 09/17/20  Yes Angiulli, Lavon Paganini, PA-C  celecoxib (CELEBREX) 200 MG capsule Take 400 mg by mouth daily.   Yes [provider]  Cholecalciferol (VITAMIN D3) 50 MCG (2000 UT) TABS Take 2,000 Units by mouth daily. 09/17/20  Yes Angiulli, Lavon Paganini, PA-C  denosumab (PROLIA) 60 MG/ML SOSY injection Inject 60 mg into the skin every 6 (six) months. 05/23/19  Yes [provider]  dexlansoprazole (DEXILANT) 60 MG capsule Take 1 capsule (60 mg total) by mouth daily. 09/17/20  Yes Angiulli, Lavon Paganini, PA-C  docusate sodium (COLACE) 100 MG capsule Take 100 mg by mouth daily as needed for mild constipation.   Yes [provider]  ferrous sulfate 325 (65 FE) MG tablet Take 1 tablet (325 mg total) by mouth daily. 09/17/20  Yes Angiulli, Lavon Paganini, PA-C  furosemide (LASIX) 20 MG tablet Take 1 tablet (20 mg total) by mouth daily. 09/17/20  Yes Angiulli, Lavon Paganini, PA-C  hydrALAZINE (APRESOLINE) 25 MG tablet Take 1 tablet (25 mg total) by mouth 2 (two) times daily. 10/26/20  Yes Martinique, Peter M, MD  lamoTRIgine (LAMICTAL)  100 MG tablet Take 2 tablets (200 mg total) by mouth 2 (two) times daily. 09/17/20  Yes Angiulli, Lavon Paganini, PA-C  montelukast (SINGULAIR) 10 MG tablet Take 1 tablet (10 mg total) by mouth at bedtime. TAKE 1 TABLET BY MOUTH EVERYDAY AT BEDTIME Patient taking differently: Take 10 mg by mouth at bedtime. 11/03/20  Yes Icard, Octavio Graves, DO  Multiple Vitamin (MULTIVITAMIN) capsule Take 1 capsule by mouth daily.    Yes [provider]  nitroGLYCERIN (NITROSTAT) 0.4 MG SL tablet Place 1 tablet (0.4 mg total) under the tongue every 5 (five) minutes as needed for chest pain. 02/28/19  Yes Kroeger, Lorelee Cover., PA-C  Omega-3 Fatty Acids (FISH OIL) 1000 MG CAPS Take 1,000 mg by mouth 2 (two) times daily.   Yes [provider]  Oxycodone HCl 10 MG TABS Take 1 tablet (10 mg total) by mouth every 4 (four) hours as needed for severe pain. 09/17/20  Yes Angiulli, Lavon Paganini, PA-C  Polyethyl Glycol-Propyl Glycol (SYSTANE) 0.4-0.3 % GEL ophthalmic gel Place 1 application into both eyes daily as needed (dry eyes).   Yes [provider]  potassium chloride (KLOR-CON) 8 MEQ tablet Take 1 tablet (8 mEq total) by mouth daily. 10/26/20  Yes Martinique, Peter M, MD  sertraline (ZOLOFT) 50 MG tablet Take 1 tablet (50 mg total) by mouth daily. 09/17/20  Yes Angiulli, Lavon Paganini, PA-C  tiZANidine (ZANAFLEX) 4 MG tablet Take 1 tablet (4 mg total) by mouth every 6 (six) hours. 09/17/20  Yes Angiulli, Lavon Paganini, PA-C  valsartan (DIOVAN) 160 MG tablet Take 2 tablets (320 mg total) by mouth daily. 10/29/20  Yes Martinique, Peter M, MD  amLODipine (NORVASC) 5 MG tablet Take 1 tablet (5 mg total) by mouth daily. Patient not taking: Reported on 10/26/2020 09/17/20   Angiulli, Lavon Paganini, PA-C  atorvastatin (LIPITOR) 20 MG tablet Take 1 tablet (20 mg total) by mouth daily. Patient not taking: Reported on 11/30/2020 09/17/20   Angiulli, Lavon Paganini, PA-C  linaclotide [facility]) 145 MCG CAPS capsule Take 1 capsule (145 mcg total) by mouth daily  before breakfast. Patient not taking:  Reported on 11/30/2020 09/17/20   Angiulli, Lavon Paganini, PA-C  oxyCODONE (OXYCONTIN) 10 mg 12 hr tablet Take 1 tablet (10 mg total) by mouth every 12 (twelve) hours. Patient not taking: Reported on 11/30/2020 09/17/20   Angiulli, Lavon Paganini, PA-C  tamsulosin (FLOMAX) 0.4 MG CAPS capsule Take 1 capsule (0.4 mg total) by mouth daily after supper. Patient not taking: Reported on 11/30/2020 09/17/20   Cathlyn Parsons, PA-C    Physical Exam: Vitals:   02/06/21 1730 02/06/21 1800 02/06/21 1830 02/06/21 1900  BP: (!) 147/92 (!) 144/92 (!) 151/99 (!) 150/94  Pulse: 98 89 89 91  Resp: (!) 21 16 20  (!) 22  Temp:      TempSrc:      SpO2: 96% 96% 96% 93%  Weight:      Height:         Vitals:   02/06/21 1730 02/06/21 1800 02/06/21 1830 02/06/21 1900  BP: (!) 147/92 (!) 144/92 (!) 151/99 (!) 150/94  Pulse: 98 89 89 91  Resp: (!) 21 16 20  (!) 22  Temp:      TempSrc:      SpO2: 96% 96% 96% 93%  Weight:      Height:       General: older overweight wooman in no distress. Has immoblile left face/mouth. Eyes: PERRL, lids and conjunctivae normal ENMT: Mucous membranes are moist. Posterior pharynx clear of any exudate or lesions. Neck: normal, supple, no masses, no thyromegaly Respiratory: Normal respiratory effort at excam. No accessory muscle use. Dense rales at right base, mild expiratory wheezing.  Cardiovascular: Regular rate and rhythm, no murmurs / rubs / gallops. No extremity edema. 2+ pedal pulses. No carotid bruits.  Abdomen: obese, no tenderness, no masses palpated. No hepatosplenomegaly. Bowel sounds positive.  Musculoskeletal: no clubbing / cyanosis. No joint deformity upper and lower extremities. Good ROM, no contractures. Normal muscle tone.  Skin: no rashes, lesions, ulcers. No induration Neurologic: CN 2-12 grossly intact.  Strength 4/5 in all 4.  Psychiatric: Normal judgment and insight. Alert and oriented x 3. Normal mood.   (  Labs on  Admission: I have personally reviewed following labs and imaging studies  CBC: Recent Labs  Lab 02/06/21 0905  WBC 15.3*  HGB 14.7  HCT 41.8  MCV 98.8  PLT 885   Basic Metabolic Panel: Recent Labs  Lab 02/06/21 0905  NA 138  K 4.5  CL 106  CO2 22  GLUCOSE 148*  BUN 14  CREATININE 0.96  CALCIUM 8.9   GFR: Estimated Creatinine Clearance: 51.3 mL/min (by C-G formula based on SCr of 0.96 mg/dL). Liver Function Tests: Recent Labs  Lab 02/06/21 0905  AST 24  ALT 25  ALKPHOS 111  BILITOT 0.5  PROT 6.3*  ALBUMIN 3.3*   No results for input(s): LIPASE, AMYLASE in the last 168 hours. No results for input(s): AMMONIA in the last 168 hours. Coagulation Profile: No results for input(s): INR, PROTIME in the last 168 hours. Cardiac Enzymes: No results for input(s): CKTOTAL, CKMB, CKMBINDEX, TROPONINI in the last 168 hours. BNP (last 3 results) No results for input(s): PROBNP in the last 8760 hours. HbA1C: No results for input(s): HGBA1C in the last 72 hours. CBG: No results for input(s): GLUCAP in the last 168 hours. Lipid Profile: No results for input(s): CHOL, HDL, LDLCALC, TRIG, CHOLHDL, LDLDIRECT in the last 72 hours. Thyroid Function Tests: No results for input(s): TSH, T4TOTAL, FREET4, T3FREE, THYROIDAB in the last 72 hours. Anemia Panel: No results  for input(s): VITAMINB12, FOLATE, FERRITIN, TIBC, IRON, RETICCTPCT in the last 72 hours. Urine analysis:    Component Value Date/Time   COLORURINE YELLOW 08/25/2020 2058   APPEARANCEUR CLEAR 08/25/2020 2058   LABSPEC 1.008 08/25/2020 2058   PHURINE 6.0 08/25/2020 2058   GLUCOSEU NEGATIVE 08/25/2020 2058   HGBUR NEGATIVE 08/25/2020 2058   [place] 08/25/2020 2058   [place] 08/25/2020 2058   PROTEINUR NEGATIVE 08/25/2020 2058   UROBILINOGEN 0.2 03/07/2012 1234   NITRITE NEGATIVE 08/25/2020 2058   LEUKOCYTESUR TRACE (A) 08/25/2020 2058    Radiological Exams on Admission: CT Chest W  Contrast  Result Date: 02/06/2021 CLINICAL DATA:  76 year old female with shortness of breath today. EXAM: CT CHEST WITH CONTRAST TECHNIQUE: Multidetector CT imaging of the chest was performed during intravenous contrast administration. RADIATION DOSE REDUCTION: This exam was performed according to the departmental dose-optimization program which includes automated exposure control, adjustment of the mA and/or kV according to patient size and/or use of iterative reconstruction technique. CONTRAST:  119mL OMNIPAQUE IOHEXOL 300 MG/ML  SOLN COMPARISON:  03/02/2020 and prior chest CTs. 02/06/2021 and prior chest radiographs. 08/25/2020 thoracic spine MR FINDINGS: Cardiovascular: Cardiomegaly again identified. Heavy coronary artery and aortic atherosclerotic calcifications are present. No thoracic aortic aneurysm or pericardial effusion identified. Mediastinum/Nodes: No enlarged mediastinal, hilar, or axillary lymph nodes. Thyroid gland, trachea, and esophagus demonstrate no significant findings. Lungs/Pleura: Moderate to severe emphysema again identified. Mild ground-glass opacities and interstitial opacities within both lungs are not significantly changed, nonspecific. A 1.5 cm nodular opacity is identified within the LEFT LOWER lobe (series 4: Image 17) but there is increasing LEFT basilar atelectasis in this region since the prior study as well. There is no evidence of pulmonary mass, pneumothorax or pleural effusion. Upper Abdomen: No acute abnormality. Musculoskeletal: No acute abnormalities. T11 and T12 compression fractures do not appear significantly changed from 08/25/2020 MR IMPRESSION: 1. 1.5 cm nodular opacity within the LEFT LOWER lobe with increasing LEFT basilar atelectasis in this region since the prior study. This probably represents an area of atelectasis but short-term chest CT follow-up is recommended (3 months). 2. Unchanged mild ground-glass opacities and interstitial opacities within both lungs,  nonspecific but may represent edema. 3. Cardiomegaly and coronary artery disease. 4. Unchanged T11 and T12 compression fractures. 5. Aortic Atherosclerosis (ICD10-I70.0) and Emphysema (ICD10-J43.9). Electronically Signed   By: Margarette Canada M.D.   On: 02/06/2021 12:53   DG Chest Portable 1 View  Result Date: 02/06/2021 CLINICAL DATA:  Shortness of breath. EXAM: PORTABLE CHEST 1 VIEW COMPARISON:  Chest radiograph 08/25/2020. FINDINGS: Monitoring leads overlie the patient. Stable cardiomegaly. Tortuosity and calcification of the thoracic aorta. Emphysematous change. Interval development of diffuse bilateral interstitial opacities. No pleural effusion or pneumothorax. IMPRESSION: Diffuse bilateral interstitial opacities may represent multifocal infection or pulmonary edema. Electronically Signed   By: Lovey Newcomer M.D.   On: 02/06/2021 09:56    EKG: Independently reviewed. SR, LAFB, ?recent anterospetal injury  Assessment/Plan Principal Problem:   COPD with acute exacerbation ([place]) Active Problems:   Acute respiratory failure with hypoxia (HCC)   Chronic diastolic congestive heart failure (HCC)   Coronary artery disease   Acute on chronic combined systolic and diastolic CHF (congestive heart failure) (HCC)   HTN (hypertension)   1. COPD Exacerbation with possible pneumonia and respiratory failure - based on symptoms, imaging and labs Plan Med-tele admit  Abx - Levaquin + azithromycin  Oxygen to keep sats >85%  Continue home regimen of inhalational meds  Solumedrol  125 q 12 then prednisone 40  2. HFpEF - patient with mild fluid overload Plan  Lasix IV 40 mg q 8 x 3  Continue home meds  ECHO  3. CAD - h/o CAD. Elevated troponins. Cardiology has consulted and does not feel patient with ACS, troponins driven by #3,3 Plan Continue home regimen  Cardiology to follow.   DVT prophylaxis: lovenox  Code Status: full code  Family Communication: [name] Carlton-friend \ Disposition Plan: home when  stable  Consults called: cardiology - Richardson Dopp (with names) Admission status: inpatient-tele    Adella Hare MD [facility] Pager [phone]  If 7PM-7AM, please contact night-coverage [email or web address] Password [facility]  02/06/2021, 7:24 PM

## 2021-02-06 NOTE — Progress Notes (Signed)
ANTICOAGULATION CONSULT NOTE - Initial Consult  Pharmacy Consult for heparin Indication: chest pain/ACS  Allergies  Allergen Reactions   Cephalexin     Other reaction(s): diarrhea   Doxycycline Diarrhea   Gabapentin Other (See Comments)    sleepiness   Morphine And Related Anxiety    Was confused when taking it    Patient Measurements: Height: 5\' 3"  (160 cm) Weight: 84.5 kg (186 lb 4.6 oz) IBW/kg (Calculated) : 52.4 Heparin Dosing Weight: 71kg  Vital Signs: Temp: 98.5 F (36.9 C) (01/21 0858) Temp Source: Oral (01/21 0858) BP: 136/89 (01/21 1500) Pulse Rate: 93 (01/21 1500)  Labs: Recent Labs    02/06/21 0905 02/06/21 1155 02/06/21 1336  HGB 14.7  --   --   HCT 41.8  --   --   PLT 336  --   --   CREATININE 0.96  --   --   TROPONINIHS  --  354* 485*    Estimated Creatinine Clearance: 51.3 mL/min (by C-G formula based on SCr of 0.96 mg/dL).   Medical History: Past Medical History:  Diagnosis Date   Alcoholism ([place])    recovering since 2000   Anxiety and depression    Arthritis BACK   Bipolar disorder ([place])    Blepharospasm LEFT EYE   CHF exacerbation ([place]) 02/24/2017   Chronic back pain    COPD (chronic obstructive pulmonary disease) (HCC)    Coronary artery disease CARDIOLOGIST- DR Martinique--- LAST VISIT NOTE 09-07-2009  W/ CHART   Emphysema    GERD (gastroesophageal reflux disease)    History of alcohol abuse RECOVERING SINCE 2000   History of Left renal mass 03/02/2012   underwent partial nephrectomy   HTN (hypertension)    Idiopathic acute facial nerve palsy LEFT SIDE--  BOTOX THERAPY   Inferior MI ([place]) 2000--  POST PTCA W/ STENT X1   Osteoporosis    Other and unspecified general anesthetics causing adverse effect in therapeutic use post op delirium--  last anes record w/ chart  from   09-22-2009 (spinal w/ light sedation)   Peripheral vascular disease ([place]) POST RIGHT CAROTID SURG.  1995   Renal cell carcinoma ([place]) 07/09/12   Left mass   Rosacea  LEFT FACIAL RASH   S/P radiation therapy 02/21/2012   38.75 Gy HDR 5 Fractions- vaginal cuff   Scoliosis    Seizures (HCC)    x 1 after abrupt discontinuation of  Clonidine   Status post carotid endarterectomy RIGHT --  1995   Status post primary angioplasty with coronary stent 2000--  POST INFERIOR MI   Unstable balance WALKS W/ CANE   Vaginal cancer ([place])     Assessment: 76 YOF presenting with SOB, CP, elevated troponin.  She is not on anticoagulation PTA  Goal of Therapy:  Heparin level 0.3-0.7 units/ml Monitor platelets by anticoagulation protocol: Yes   Plan:  Heparin 4000 units IV x 1, and gtt at 850 units/hr F/u 8 hour heparin level F/u cards eval and recs  Bertis Ruddy, PharmD Clinical Pharmacist ED Pharmacist Phone # [phone] 02/06/2021 3:13 PM

## 2021-02-06 NOTE — ED Notes (Signed)
Patient transported to CT 

## 2021-02-06 NOTE — Consult Note (Signed)
Cardiology Consultation:   Patient ID: [name] MRN: [id number]; DOB: 10/27/1945  Admit date: 02/06/2021 Date of Consult: 02/06/2021  PCP:  Harlan Stains, MD   [facility] Providers Cardiologist:  Peter Martinique, MD        Patient Profile:   [name] is a 76 y.o. female with a hx of Coronary artery disease status post inferior myocardial infarction treated with PCI to RCA in 2000, ischemic cardiomyopathy with improved EF, HFpEF, carotid artery disease status post right CEA in 1995, peripheral arterial disease (moderate bilateral femoropopliteal disease) treated medically, hypertension, hyperlipidemia, COPD on chronic O2, renal carcinoma status post left partial nephrectomy in 2014, cervical disc disease (admitted in 8/22 with central cord syndrome requiring surgery) who is being seen 02/06/2021 for the evaluation of elevated Troponins at the request of Dr. Rogene Houston.  History of Present Illness:   [name] was last seen by Dr. Martinique in 10/22.  She presented to the emergency room via EMS for respiratory distress.  She has been having symptoms of cough and congestion for about 3 weeks.  O2 sats were 80% on 2 L per EMS.  She was placed on nonrebreather.  Chest x-ray was concerning for multifocal pneumonia or pulmonary edema.  BNP is not significantly elevated.  COVID test is negative.  Chest CT is not consistent with pneumonia.  There may be some slight pulmonary edema EDP did give 1 dose of IV Lasix 80 mg.  She was also given Solu-Medrol, albuterol.  Her high-sensitivity troponins are elevated.  Cardiology is asked to further evaluate.  Overall, her breathing has been stable.  She notes her breathing suddenly worsened today when she got up to go the bathroom.  She is fairly sedentary to her chronic neck and back issues.  She notes that she has felt heaviness in her chest today.  She is not certain if the breathing treatment, steroids or Lasix helped with her symptoms.  She has  not had any recent syncope.  She sleeps in recliner chronically secondary to her back and neck issues.  She has not had any weight gain or lower extremity edema   Prior CV Studies: Myoview 02/2017: EF 51, inferolateral scar with mild peri-infarct ischemia  Echo 05/2020: EF 55, severe inferior HK, mild LVH, G1 DD, normal RVSF, RVSP 32.4, mild BAE, mild MR, AV sclerosis without stenosis  Carotid US 12/05/2019: Bilateral ICA 1-39  ED Data: Labs: K+ 4.5, creatinine 0.96, albumin 3.3, total protein 6.3, ALT 25, Hgb 14.7 BNP 172 hs-Trop 354 >> 485 SARS-CoV-2 and influenza A/B neg Chest x-ray: Diffuse bilateral interstitial opacities-multifocal infection or pulmonary edema Chest CTA: 1.5 cm left lower lobe nodule; groundglass opacities and interstitial opacities; CAD, aortic atherosclerosis, emphysema  EKG NSR, HR 94, inferior Q waves, anteroseptal Q waves, no acute ST-T wave changes, QTC 446   Past Medical History:  Diagnosis Date   (HFpEF) heart failure with preserved ejection fraction ([place]) 02/24/2017   Echo 05/2020: EF 55, severe inferior HK, mild LVH, Gr1 DD, normal RVSF, RVSP 32.4, mild BAE, mild MR, AV sclerosis without stenosis   Alcoholism ([place])    recovering since 2000   Anxiety and depression    Arthritis BACK   Bipolar disorder (HCC)    Blepharospasm LEFT EYE   Chronic back pain    COPD (chronic obstructive pulmonary disease) ([place])    Coronary artery disease    s/p Inf MI in 2000 tx with PCI to RCA // Myoview in 2019 with inf-lat  scar and mild peri-infarct ischemia   Emphysema    GERD (gastroesophageal reflux disease)    History of alcohol abuse RECOVERING SINCE 2000   History of Left renal mass 03/02/2012   underwent partial nephrectomy   HTN (hypertension)    Idiopathic acute facial nerve palsy LEFT SIDE--  BOTOX THERAPY   Inferior MI ([place]) 2000--  POST PTCA W/ STENT X1   Ischemic cardiomyopathy    EF returned to normal   Osteoporosis    Other and unspecified general  anesthetics causing adverse effect in therapeutic use post op delirium--  last anes record w/ chart  from   09-22-2009 (spinal w/ light sedation)   Peripheral vascular disease ([place]) POST RIGHT CAROTID SURG.  1995   Renal cell carcinoma ([place]) 07/09/2012   Left mass   Rosacea LEFT FACIAL RASH   S/P radiation therapy 02/21/2012   38.75 Gy HDR 5 Fractions- vaginal cuff   Scoliosis    Seizures (HCC)    x 1 after abrupt discontinuation of  Clonidine   Status post carotid endarterectomy RIGHT --  1995   Status post primary angioplasty with coronary stent 2000--  POST INFERIOR MI   Unstable balance WALKS W/ CANE   Vaginal cancer ([place])     Past Surgical History:  Procedure Laterality Date   BREAST EXCISIONAL BIOPSY Left 2019   b9 axilla Bx X 3   CAROTID ENDARTERECTOMY  1995   RIGHT   CATARACT EXTRACTION W/ INTRAOCULAR LENS  IMPLANT, BILATERAL     CERVICAL CONIZATION W/BX  09-23-2008   CORONARY ANGIOPLASTY WITH STENT PLACEMENT  2000-   INFERIOR MI   X1 STENT TO RCA   EUS N/A 03/05/2012   Procedure: FULL UPPER ENDOSCOPIC ULTRASOUND (EUS) RADIAL and EGD;  Surgeon: Milus Banister, MD;  Location: [facility];  Service: Endoscopy;  Laterality: N/A;  ercp scope first than eus scope   HEMIARTHROPLASTY HIP  12-26-2008   LEFT FEMORAL NECK FX   ORIF HIP FRACTURE  02-13-2007   RIGHT FEMORAL NECK FX   ORIF RIGHT DISTAL RADIUS AND RIGHT PROXIMAL HUMEROUS NECK FX'S  10-10-2005   POSTERIOR CERVICAL LAMINECTOMY N/A 08/28/2020   Procedure: Laminectomy and Foraminotomy - Cervical Two-Three, Cervical Three-Four, with lateral mass fusion/ fixation;  Surgeon: Dawley, Theodoro Doing, DO;  Location: [facility];  Service: Neurosurgery;  Laterality: N/A;   RIGHT SHOULDER SURG.  2007   ROBOTIC ASSITED PARTIAL NEPHRECTOMY Left 07/09/2012   Procedure: ROBOTIC ASSITED PARTIAL NEPHRECTOMY;  Surgeon: Dutch Gray, MD;  Location: [facility];  Service: Urology;  Laterality: Left;   TOTAL HIP ARTHROPLASTY  04-15-2008   POST FAILED  RIGHT  HIP ORIF FEMORAL FX   TOTAL KNEE ARTHROPLASTY  09-22-2009   RIGHT   UPPER RIGHT VAGINAL REGION  12/28/11   BIOPSY: SQUAMOUS CELL CARCINOMA   VAGINAL HYSTERECTOMY  07/06/2009   Secondary to dysplasia     Home Medications:  Prior to Admission medications   Medication Sig Start Date End Date Taking? Authorizing Provider  acetaminophen (TYLENOL) 325 MG tablet Take 2 tablets (650 mg total) by mouth every 4 (four) hours as needed for mild pain ((score 1 to 3) or temp > 100.5). 09/17/20  Yes Angiulli, Lavon Paganini, PA-C  albuterol (PROVENTIL) (2.5 MG/3ML) 0.083% nebulizer solution Take 3 mLs (2.5 mg total) by nebulization every 6 (six) hours as needed for wheezing or shortness of breath. 12/23/20  Yes Icard, Bradley L, DO  albuterol (VENTOLIN HFA) 108 (90 Base) MCG/ACT inhaler Inhale 2 puffs into  the lungs every 6 (six) hours as needed for wheezing. 09/17/20  Yes Angiulli, Lavon Paganini, PA-C  amlodipine-atorvastatin (CADUET) 10-20 MG tablet Take 1 tablet by mouth daily.   Yes [provider]  ARIPiprazole (ABILIFY) 10 MG tablet Take 1 tablet (10 mg total) by mouth daily. 09/17/20  Yes Angiulli, Lavon Paganini, PA-C  Ascorbic Acid (VITAMIN C) 1000 MG tablet Take 1,000 mg by mouth daily.   Yes [provider]  aspirin EC 81 MG tablet Take 81 mg by mouth every morning.   Yes [provider]  Budeson-Glycopyrrol-Formoterol (BREZTRI AEROSPHERE) 160-9-4.8 MCG/ACT AERO Inhale 2 puffs into the lungs in the morning and at bedtime. 09/17/20  Yes Angiulli, Lavon Paganini, PA-C  Calcium Carb-Cholecalciferol 600-400 MG-UNIT TABS Take 1 tablet by mouth daily. 09/17/20  Yes Angiulli, Lavon Paganini, PA-C  celecoxib (CELEBREX) 200 MG capsule Take 400 mg by mouth daily.   Yes [provider]  Cholecalciferol (VITAMIN D3) 50 MCG (2000 UT) TABS Take 2,000 Units by mouth daily. 09/17/20  Yes Angiulli, Lavon Paganini, PA-C  denosumab (PROLIA) 60 MG/ML SOSY injection Inject 60 mg into the skin every 6 (six) months. 05/23/19  Yes  [provider]  dexlansoprazole (DEXILANT) 60 MG capsule Take 1 capsule (60 mg total) by mouth daily. 09/17/20  Yes Angiulli, Lavon Paganini, PA-C  docusate sodium (COLACE) 100 MG capsule Take 100 mg by mouth daily as needed for mild constipation.   Yes [provider]  ferrous sulfate 325 (65 FE) MG tablet Take 1 tablet (325 mg total) by mouth daily. 09/17/20  Yes Angiulli, Lavon Paganini, PA-C  furosemide (LASIX) 20 MG tablet Take 1 tablet (20 mg total) by mouth daily. 09/17/20  Yes Angiulli, Lavon Paganini, PA-C  hydrALAZINE (APRESOLINE) 25 MG tablet Take 1 tablet (25 mg total) by mouth 2 (two) times daily. 10/26/20  Yes Martinique, Peter M, MD  lamoTRIgine (LAMICTAL) 100 MG tablet Take 2 tablets (200 mg total) by mouth 2 (two) times daily. 09/17/20  Yes Angiulli, Lavon Paganini, PA-C  montelukast (SINGULAIR) 10 MG tablet Take 1 tablet (10 mg total) by mouth at bedtime. TAKE 1 TABLET BY MOUTH EVERYDAY AT BEDTIME Patient taking differently: Take 10 mg by mouth at bedtime. 11/03/20  Yes Icard, Octavio Graves, DO  Multiple Vitamin (MULTIVITAMIN) capsule Take 1 capsule by mouth daily.    Yes [provider]  nitroGLYCERIN (NITROSTAT) 0.4 MG SL tablet Place 1 tablet (0.4 mg total) under the tongue every 5 (five) minutes as needed for chest pain. 02/28/19  Yes Kroeger, Lorelee Cover., PA-C  Omega-3 Fatty Acids (FISH OIL) 1000 MG CAPS Take 1,000 mg by mouth 2 (two) times daily.   Yes [provider]  Oxycodone HCl 10 MG TABS Take 1 tablet (10 mg total) by mouth every 4 (four) hours as needed for severe pain. 09/17/20  Yes Angiulli, Lavon Paganini, PA-C  Polyethyl Glycol-Propyl Glycol (SYSTANE) 0.4-0.3 % GEL ophthalmic gel Place 1 application into both eyes daily as needed (dry eyes).   Yes [provider]  potassium chloride (KLOR-CON) 8 MEQ tablet Take 1 tablet (8 mEq total) by mouth daily. 10/26/20  Yes Martinique, Peter M, MD  sertraline (ZOLOFT) 50 MG tablet Take 1 tablet (50 mg total) by mouth daily. 09/17/20  Yes  Angiulli, Lavon Paganini, PA-C  tiZANidine (ZANAFLEX) 4 MG tablet Take 1 tablet (4 mg total) by mouth every 6 (six) hours. 09/17/20  Yes Angiulli, Lavon Paganini, PA-C  valsartan (DIOVAN) 160 MG tablet Take 2 tablets (320  mg total) by mouth daily. 10/29/20  Yes Martinique, Peter M, MD  amLODipine (NORVASC) 5 MG tablet Take 1 tablet (5 mg total) by mouth daily. Patient not taking: Reported on 10/26/2020 09/17/20   Angiulli, Lavon Paganini, PA-C  atorvastatin (LIPITOR) 20 MG tablet Take 1 tablet (20 mg total) by mouth daily. Patient not taking: Reported on 11/30/2020 09/17/20   Angiulli, Lavon Paganini, PA-C  linaclotide [facility]) 145 MCG CAPS capsule Take 1 capsule (145 mcg total) by mouth daily before breakfast. Patient not taking: Reported on 11/30/2020 09/17/20   Angiulli, Lavon Paganini, PA-C  oxyCODONE (OXYCONTIN) 10 mg 12 hr tablet Take 1 tablet (10 mg total) by mouth every 12 (twelve) hours. Patient not taking: Reported on 11/30/2020 09/17/20   Angiulli, Lavon Paganini, PA-C  tamsulosin (FLOMAX) 0.4 MG CAPS capsule Take 1 capsule (0.4 mg total) by mouth daily after supper. Patient not taking: Reported on 11/30/2020 09/17/20   Cathlyn Parsons, PA-C    Inpatient Medications: Scheduled Meds:  methylPREDNISolone (SOLU-MEDROL) injection  125 mg Intravenous Once   Continuous Infusions:  heparin 850 Units/hr (02/06/21 1548)   PRN Meds: albuterol  Allergies:    Allergies  Allergen Reactions   Cephalexin     Other reaction(s): diarrhea   Doxycycline Diarrhea   Gabapentin Other (See Comments)    sleepiness   Morphine And Related Anxiety    Was confused when taking it    Social History:   Social History   Socioeconomic History   Marital status: Divorced    Spouse name: Not on file   Number of children: 0   Years of education: Not on file   Highest education level: Not on file  Occupational History   Occupation: retired  Tobacco Use   Smoking status: Former    Packs/day: 2.00    Years: 50.00    Pack years: 100.00     Types: Cigarettes    Quit date: 01/17/2010    Years since quitting: 11.0   Smokeless tobacco: Never   Tobacco comments:    STATES QUIT SMOKING 01-17-2010  Vaping Use   Vaping Use: Never used  Substance and Sexual Activity   Alcohol use: Not Currently    Comment: RECOVERING ALCOHOLIC--   QUIT IN 5284   Drug use: No   Sexual activity: Never  Other Topics Concern   Not on file  Social History Narrative   Not on file   Social Determinants of Health   Financial Resource Strain: Not on file  Food Insecurity: Not on file  Transportation Needs: Not on file  Physical Activity: Not on file  Stress: Not on file  Social Connections: Not on file  Intimate Partner Violence: Not on file    Family History:    Family History  Problem Relation Age of Onset   Hypertension Mother    Hypertension Father    Cancer Maternal Grandfather        type unknown   Hypertension Sister      ROS:  Please see the history of present illness.  She has not had any recent fever, vomiting, melena, hematochezia, hemoptysis All other ROS reviewed and negative.     Physical Exam/Data:   Vitals:   02/06/21 1400 02/06/21 1430 02/06/21 1500 02/06/21 1602  BP: (!) 156/98 (!) 144/90 136/89 (!) 160/100  Pulse: 97 95 93 (!) 110  Resp: 12 (!) 22 (!) 22 (!) 25  Temp:      TempSrc:      SpO2: 95% 92%  92% 95%  Weight:      Height:        Intake/Output Summary (Last 24 hours) at 02/06/2021 1642 Last data filed at 02/06/2021 1429 Gross per 24 hour  Intake --  Output 1100 ml  Net -1100 ml   Last 3 Weights 02/06/2021 01/29/2021 12/23/2020  Weight (lbs) 186 lb 4.6 oz 186 lb 3.2 oz 180 lb 12.8 oz  Weight (kg) 84.5 kg 84.46 kg 82.01 kg     Body mass index is 33 kg/m.  General:  Well nourished, well developed, in no acute distress HEENT: normal Neck: no obvious JVD Vascular: DP/PT diminished bilaterally Cardiac:  rapid regular rhythm; no obvious murmur Lungs: Decreased breath sounds bilaterally, no obvious  rales or wheezing Abd: soft, nontender, no hepatomegaly  Ext: no edema Musculoskeletal:  No deformities Skin: warm and dry  Neuro:  CNs 2-12 intact, no focal abnormalities noted Psych:  Normal affect   EKG:   see HPI Telemetry:  Telemetry was personally reviewed and demonstrates:  sinus tachy  Laboratory Data:  High Sensitivity Troponin:   Recent Labs  Lab 02/06/21 1155 02/06/21 1336  TROPONINIHS 354* 485*     Chemistry Recent Labs  Lab 02/06/21 0905  NA 138  K 4.5  CL 106  CO2 22  GLUCOSE 148*  BUN 14  CREATININE 0.96  CALCIUM 8.9  GFRNONAA >60  ANIONGAP 10    Recent Labs  Lab 02/06/21 0905  PROT 6.3*  ALBUMIN 3.3*  AST 24  ALT 25  ALKPHOS 111  BILITOT 0.5    Hematology Recent Labs  Lab 02/06/21 0905  WBC 15.3*  RBC 4.23  HGB 14.7  HCT 41.8  MCV 98.8  MCH 34.8*  MCHC 35.2  RDW 14.0  PLT 336    BNP Recent Labs  Lab 02/06/21 0905  BNP 172.0*     Radiology/Studies:  CT Chest W Contrast  Result Date: 02/06/2021 CLINICAL DATA:  76 year old female with shortness of breath today. EXAM: CT CHEST WITH CONTRAST TECHNIQUE: Multidetector CT imaging of the chest was performed during intravenous contrast administration. RADIATION DOSE REDUCTION: This exam was performed according to the departmental dose-optimization program which includes automated exposure control, adjustment of the mA and/or kV according to patient size and/or use of iterative reconstruction technique. CONTRAST:  136mL OMNIPAQUE IOHEXOL 300 MG/ML  SOLN COMPARISON:  03/02/2020 and prior chest CTs. 02/06/2021 and prior chest radiographs. 08/25/2020 thoracic spine MR FINDINGS: Cardiovascular: Cardiomegaly again identified. Heavy coronary artery and aortic atherosclerotic calcifications are present. No thoracic aortic aneurysm or pericardial effusion identified. Mediastinum/Nodes: No enlarged mediastinal, hilar, or axillary lymph nodes. Thyroid gland, trachea, and esophagus demonstrate no  significant findings. Lungs/Pleura: Moderate to severe emphysema again identified. Mild ground-glass opacities and interstitial opacities within both lungs are not significantly changed, nonspecific. A 1.5 cm nodular opacity is identified within the LEFT LOWER lobe (series 4: Image 17) but there is increasing LEFT basilar atelectasis in this region since the prior study as well. There is no evidence of pulmonary mass, pneumothorax or pleural effusion. Upper Abdomen: No acute abnormality. Musculoskeletal: No acute abnormalities. T11 and T12 compression fractures do not appear significantly changed from 08/25/2020 MR IMPRESSION: 1. 1.5 cm nodular opacity within the LEFT LOWER lobe with increasing LEFT basilar atelectasis in this region since the prior study. This probably represents an area of atelectasis but short-term chest CT follow-up is recommended (3 months). 2. Unchanged mild ground-glass opacities and interstitial opacities within both lungs, nonspecific but may represent edema. 3.  Cardiomegaly and coronary artery disease. 4. Unchanged T11 and T12 compression fractures. 5. Aortic Atherosclerosis (ICD10-I70.0) and Emphysema (ICD10-J43.9). Electronically Signed   By: Margarette Canada M.D.   On: 02/06/2021 12:53   DG Chest Portable 1 View  Result Date: 02/06/2021 CLINICAL DATA:  Shortness of breath. EXAM: PORTABLE CHEST 1 VIEW COMPARISON:  Chest radiograph 08/25/2020. FINDINGS: Monitoring leads overlie the patient. Stable cardiomegaly. Tortuosity and calcification of the thoracic aorta. Emphysematous change. Interval development of diffuse bilateral interstitial opacities. No pleural effusion or pneumothorax. IMPRESSION: Diffuse bilateral interstitial opacities may represent multifocal infection or pulmonary edema. Electronically Signed   By: Lovey Newcomer M.D.   On: 02/06/2021 09:56     Assessment and Plan:   1. Elevated Troponin She does not L sided chest heaviness.  She does not feel like this is similar to  her prior angina.  Her EKG is not acute.  Troponin elevation is fairly flat.  Her chest pain is likely related to COPD exacerbation.  Would continue ASA, atorvastatin.  Would avoid beta-blocker given COPD.  Check echocardiogram.  Consider Myoview prior to DC to rule out ischemia.  If EF down or new WMA, will need cath.    2. COPD Exacerbation Per IM  3. Coronary artery disease  Hx of remote inferior MI.  Myoview in 2019 with scar and mild peri-infarct ischemia.  As noted, get echocardiogram to evaluate elevated Troponin.    4. Hypertension  BP elevated now.  She notes significant back pain.  Continue hydralazine, amlodipine, valsartan.  If BP remains high, consider increasing hydralazine.  Given HFpEF, could add spironolactone.  5. (HFpEF) heart failure with preserved ejection fraction Volume status appears stable.  She is not sure 1 dose of IV Lasix improved symptoms.  She may have had mild volume overload in setting of AECOPD.  Would continue usual home dose of Furosemide.    Risk Assessment/Risk Scores:     HEAR Score (for undifferentiated chest pain):  HEAR Score: 5  New York Heart Association (NYHA) Functional Class NYHA Class IIIb    For questions or updates, please contact [facility] Please consult [email or web address] for contact info under    Signed, Richardson Dopp, PA-C  02/06/2021 4:42 PM

## 2021-02-06 NOTE — ED Notes (Signed)
MD at bedside. 

## 2021-02-06 NOTE — ED Notes (Signed)
ED TO INPATIENT HANDOFF REPORT  ED Nurse Name and Phone #: Mcclellan Demarais RN [phone]  S Name/Age/Gender [name] 76 y.o. female Room/Bed: 020C/020C  Code Status   Code Status: Full Code  Home/SNF/Other Home Patient oriented to: self, place, time, and situation Is this baseline? Yes   Triage Complete: Triage complete  Chief Complaint COPD with acute exacerbation ([place]) [J44.1]  Triage Note Pt arrived via [facility] from home for c/o SOB that started at 0700 this morning. When EMS arrived pt's initial bp 240/130, hr 140, Sa02 80% on 2L. Pt wear 2L 02 per [place] at baseline. EMS initially put pt on cpap and gave albuterol 10mg , atrovent 0.5mg  and solumedrol 125mg  IV, magnesium 2g IV. Per EMS pt's lung sounds initially was wheezes throughout and rhonchi in lower lobes. Pt is A&Ox4. RT at bedside. Pt eupneic at this time.    Allergies Allergies  Allergen Reactions   Cephalexin     Other reaction(s): diarrhea   Doxycycline Diarrhea   Gabapentin Other (See Comments)    sleepiness   Morphine And Related Anxiety    Was confused when taking it    Level of Care/Admitting Diagnosis ED Disposition     ED Disposition  Admit   Condition  --   [place]: [place] [[id number]]  Level of Care: Telemetry Medical [104]  May admit patient to [facility] or [facility] if equivalent level of care is available:: No  Covid Evaluation: Asymptomatic Screening Protocol (No Symptoms)  Diagnosis: COPD with acute exacerbation [facility]) [[id number]]  Admitting Physician: Neena Rhymes [[id number]]  Attending Physician: Adella Hare E [[id number]]  Estimated length of stay: past midnight tomorrow  Certification:: I certify this patient will need inpatient services for at least 2 midnights          B Medical/Surgery History Past Medical History:  Diagnosis Date   (HFpEF) heart failure with preserved ejection fraction ([place]) 02/24/2017   Echo 05/2020: EF 55, severe inferior HK, mild  LVH, Gr1 DD, normal RVSF, RVSP 32.4, mild BAE, mild MR, AV sclerosis without stenosis   Alcoholism ([place])    recovering since 2000   Anxiety and depression    Arthritis BACK   Bipolar disorder ([place])    Blepharospasm LEFT EYE   Chronic back pain    COPD (chronic obstructive pulmonary disease) ([place])    Coronary artery disease    s/p Inf MI in 2000 tx with PCI to RCA // Myoview in 2019 with inf-lat scar and mild peri-infarct ischemia   Emphysema    GERD (gastroesophageal reflux disease)    History of alcohol abuse RECOVERING SINCE 2000   History of Left renal mass 03/02/2012   underwent partial nephrectomy   HTN (hypertension)    Idiopathic acute facial nerve palsy LEFT SIDE--  BOTOX THERAPY   Inferior MI ([place]) 2000--  POST PTCA W/ STENT X1   Ischemic cardiomyopathy    EF returned to normal   Osteoporosis    Other and unspecified general anesthetics causing adverse effect in therapeutic use post op delirium--  last anes record w/ chart  from   09-22-2009 (spinal w/ light sedation)   Peripheral vascular disease ([place]) POST RIGHT CAROTID SURG.  1995   Renal cell carcinoma ([place]) 07/09/2012   Left mass   Rosacea LEFT FACIAL RASH   S/P radiation therapy 02/21/2012   38.75 Gy HDR 5 Fractions- vaginal cuff   Scoliosis    Seizures ([facility])    x 1  after abrupt discontinuation of  Clonidine   Status post carotid endarterectomy RIGHT --  1995   Status post primary angioplasty with coronary stent 2000--  POST INFERIOR MI   Unstable balance WALKS W/ CANE   Vaginal cancer [facility])    Past Surgical History:  Procedure Laterality Date   BREAST EXCISIONAL BIOPSY Left 2019   b9 axilla Bx X 3   CAROTID ENDARTERECTOMY  1995   RIGHT   CATARACT EXTRACTION W/ INTRAOCULAR LENS  IMPLANT, BILATERAL     CERVICAL CONIZATION W/BX  09-23-2008   CORONARY ANGIOPLASTY WITH STENT PLACEMENT  2000-   INFERIOR MI   X1 STENT TO RCA   EUS N/A 03/05/2012   Procedure: FULL UPPER ENDOSCOPIC ULTRASOUND (EUS) RADIAL and  EGD;  Surgeon: Milus Banister, MD;  Location: [facility];  Service: Endoscopy;  Laterality: N/A;  ercp scope first than eus scope   HEMIARTHROPLASTY HIP  12-26-2008   LEFT FEMORAL NECK FX   ORIF HIP FRACTURE  02-13-2007   RIGHT FEMORAL NECK FX   ORIF RIGHT DISTAL RADIUS AND RIGHT PROXIMAL HUMEROUS NECK FX'S  10-10-2005   POSTERIOR CERVICAL LAMINECTOMY N/A 08/28/2020   Procedure: Laminectomy and Foraminotomy - Cervical Two-Three, Cervical Three-Four, with lateral mass fusion/ fixation;  Surgeon: Dawley, Theodoro Doing, DO;  Location: [place];  Service: Neurosurgery;  Laterality: N/A;   RIGHT SHOULDER SURG.  2007   ROBOTIC ASSITED PARTIAL NEPHRECTOMY Left 07/09/2012   Procedure: ROBOTIC ASSITED PARTIAL NEPHRECTOMY;  Surgeon: Dutch Gray, MD;  Location: [facility];  Service: Urology;  Laterality: Left;   TOTAL HIP ARTHROPLASTY  04-15-2008   POST FAILED  RIGHT HIP ORIF FEMORAL FX   TOTAL KNEE ARTHROPLASTY  09-22-2009   RIGHT   UPPER RIGHT VAGINAL REGION  12/28/11   BIOPSY: SQUAMOUS CELL CARCINOMA   VAGINAL HYSTERECTOMY  07/06/2009   Secondary to dysplasia     A IV Location/Drains/Wounds Patient Lines/Drains/Airways Status     Active Line/Drains/Airways     Name Placement date Placement time Site Days   Peripheral IV 02/06/21 20 G Distal;Left;Posterior Forearm 02/06/21  --  Forearm  less than 1   Peripheral IV 02/06/21 20 G Anterior;Distal;Left Wrist 02/06/21  1541  Wrist  less than 1   External Urinary Catheter 02/06/21  0954  --  less than 1   Incision (Closed) 08/28/20 Cervical Other (Comment) 08/28/20  1833  -- 162   Pressure Injury 09/11/20 Buttocks Left Stage 2 -  Partial thickness loss of dermis presenting as a shallow open injury with a red, pink wound bed without slough. non-blanchable area with very thin dry skin flakes covering the center 09/11/20  1441  -- 148   Pressure Injury 09/11/20 Buttocks Right Stage 2 -  Partial thickness loss of dermis presenting as a shallow open injury with a  red, pink wound bed without slough. non-blanchable area with thin layer skin flakes covering the center 09/11/20  1445  -- 148            Intake/Output Last 24 hours  Intake/Output Summary (Last 24 hours) at 02/06/2021 2156 Last data filed at 02/06/2021 1941 Gross per 24 hour  Intake 72.26 ml  Output 1100 ml  Net -1027.74 ml    Labs/Imaging Results for orders placed or performed during the hospital encounter of 02/06/21 (from the past 48 hour(s))  CBC     Status: Abnormal   Collection Time: 02/06/21  9:05 AM  Result Value Ref Range   WBC 15.3 (H) 4.0 -  10.5 K/uL   RBC 4.23 3.87 - 5.11 MIL/uL   Hemoglobin 14.7 12.0 - 15.0 g/dL   HCT 41.8 36.0 - 46.0 %   MCV 98.8 80.0 - 100.0 fL   MCH 34.8 (H) 26.0 - 34.0 pg   MCHC 35.2 30.0 - 36.0 g/dL   RDW 14.0 11.5 - 15.5 %   Platelets 336 150 - 400 K/uL   nRBC 0.0 0.0 - 0.2 %    Comment: Performed at [facility] [address], [place], [place] [address]  Brain natriuretic peptide     Status: Abnormal   Collection Time: 02/06/21  9:05 AM  Result Value Ref Range   B Natriuretic Peptide 172.0 (H) 0.0 - 100.0 pg/mL    Comment: Performed at [facility] [address] [place], [place] [address]  Comprehensive metabolic panel     Status: Abnormal   Collection Time: 02/06/21  9:05 AM  Result Value Ref Range   Sodium 138 135 - 145 mmol/L   Potassium 4.5 3.5 - 5.1 mmol/L   Chloride 106 98 - 111 mmol/L   CO2 22 22 - 32 mmol/L   Glucose, Bld 148 (H) 70 - 99 mg/dL    Comment: Glucose reference range applies only to samples taken after fasting for at least 8 hours.   BUN 14 8 - 23 mg/dL   Creatinine, Ser 0.96 0.44 - 1.00 mg/dL   Calcium 8.9 8.9 - 10.3 mg/dL   Total Protein 6.3 (L) 6.5 - 8.1 g/dL   Albumin 3.3 (L) 3.5 - 5.0 g/dL   AST 24 15 - 41 U/L   ALT 25 0 - 44 U/L   Alkaline Phosphatase 111 38 - 126 U/L   Total Bilirubin 0.5 0.3 - 1.2 mg/dL   GFR, Estimated >60 >60 mL/min    Comment: (NOTE) Calculated using the  CKD-EPI Creatinine Equation (2021)    Anion gap 10 5 - 15    Comment: Performed at [facility], [place] [address], [place], [place] [address]  Resp Panel by RT-PCR (Flu A&B, Covid) Nasopharyngeal Swab     Status: None   Collection Time: 02/06/21  9:55 AM   Specimen: Nasopharyngeal Swab; Nasopharyngeal(NP) swabs in vial transport medium  Result Value Ref Range   SARS Coronavirus 2 by RT PCR NEGATIVE NEGATIVE    Comment: (NOTE) SARS-CoV-2 target nucleic acids are NOT DETECTED.  The SARS-CoV-2 RNA is generally detectable in upper respiratory specimens during the acute phase of infection. The lowest concentration of SARS-CoV-2 viral copies this assay can detect is 138 copies/mL. A negative result does not preclude SARS-Cov-2 infection and should not be used as the sole basis for treatment or other patient management decisions. A negative result may occur with  improper specimen collection/handling, submission of specimen other than nasopharyngeal swab, presence of viral mutation(s) within the areas targeted by this assay, and inadequate number of viral copies(<138 copies/mL). A negative result must be combined with clinical observations, patient history, and epidemiological information. The expected result is Negative.  Fact Sheet for Patients:  EntrepreneurPulse.com.au  Fact Sheet for Healthcare Providers:  IncredibleEmployment.be  This test is no t yet approved or cleared by the [place] FDA and  has been authorized for detection and/or diagnosis of SARS-CoV-2 by FDA under an Emergency Use Authorization (EUA). This EUA will remain  in effect (meaning this test can be used) for the duration of the COVID-19 declaration under Section 564(b)(1) of the Act, 21 U.S.C.section 360bbb-3(b)(1),  unless the authorization is terminated  or revoked sooner.       Influenza A by PCR NEGATIVE NEGATIVE   Influenza B by PCR NEGATIVE NEGATIVE     Comment: (NOTE) The Xpert Xpress SARS-CoV-2/FLU/RSV plus assay is intended as an aid in the diagnosis of influenza from Nasopharyngeal swab specimens and should not be used as a sole basis for treatment. Nasal washings and aspirates are unacceptable for Xpert Xpress SARS-CoV-2/FLU/RSV testing.  Fact Sheet for Patients: EntrepreneurPulse.com.au  Fact Sheet for Healthcare Providers: IncredibleEmployment.be  This test is not yet approved or cleared by the [place] FDA and has been authorized for detection and/or diagnosis of SARS-CoV-2 by FDA under an Emergency Use Authorization (EUA). This EUA will remain in effect (meaning this test can be used) for the duration of the COVID-19 declaration under Section 564(b)(1) of the Act, 21 U.S.C. section 360bbb-3(b)(1), unless the authorization is terminated or revoked.  Performed at [facility], [place] [address], [place], [place] [address]   Troponin I (High Sensitivity)     Status: Abnormal   Collection Time: 02/06/21 11:55 AM  Result Value Ref Range   Troponin I (High Sensitivity) 354 (HH) <18 ng/L    Comment: CRITICAL RESULT CALLED TO, READ BACK BY AND VERIFIED WITH: A. PLANKS, RN 1250 02/06/21 L. KLAR (NOTE) Elevated high sensitivity troponin I (hsTnI) values and significant  changes across serial measurements may suggest ACS but many other  chronic and acute conditions are known to elevate hsTnI results.  Refer to the Links section for chest pain algorithms and additional  guidance. Performed at [facility], [place] [address], [place], [place] [address]   Troponin I (High Sensitivity)     Status: Abnormal   Collection Time: 02/06/21  1:36 PM  Result Value Ref Range   Troponin I (High Sensitivity) 485 (HH) <18 ng/L    Comment: CRITICAL VALUE NOTED.  VALUE IS CONSISTENT WITH PREVIOUSLY REPORTED AND CALLED VALUE. (NOTE) Elevated high sensitivity troponin I (hsTnI) values and  significant  changes across serial measurements may suggest ACS but many other  chronic and acute conditions are known to elevate hsTnI results.  Refer to the Links section for chest pain algorithms and additional  guidance. Performed at [facility], [place] [address], [place], [place] [address]    CT Chest W Contrast  Result Date: 02/06/2021 CLINICAL DATA:  76 year old female with shortness of breath today. EXAM: CT CHEST WITH CONTRAST TECHNIQUE: Multidetector CT imaging of the chest was performed during intravenous contrast administration. RADIATION DOSE REDUCTION: This exam was performed according to the departmental dose-optimization program which includes automated exposure control, adjustment of the mA and/or kV according to patient size and/or use of iterative reconstruction technique. CONTRAST:  161mL OMNIPAQUE IOHEXOL 300 MG/ML  SOLN COMPARISON:  03/02/2020 and prior chest CTs. 02/06/2021 and prior chest radiographs. 08/25/2020 thoracic spine MR FINDINGS: Cardiovascular: Cardiomegaly again identified. Heavy coronary artery and aortic atherosclerotic calcifications are present. No thoracic aortic aneurysm or pericardial effusion identified. Mediastinum/Nodes: No enlarged mediastinal, hilar, or axillary lymph nodes. Thyroid gland, trachea, and esophagus demonstrate no significant findings. Lungs/Pleura: Moderate to severe emphysema again identified. Mild ground-glass opacities and interstitial opacities within both lungs are not significantly changed, nonspecific. A 1.5 cm nodular opacity is identified within the LEFT LOWER lobe (series 4: Image 17) but there is increasing LEFT basilar atelectasis in this region since the prior study as well. There is no evidence of pulmonary mass, pneumothorax or pleural effusion.  Upper Abdomen: No acute abnormality. Musculoskeletal: No acute abnormalities. T11 and T12 compression fractures do not appear significantly changed from 08/25/2020 MR IMPRESSION: 1.  1.5 cm nodular opacity within the LEFT LOWER lobe with increasing LEFT basilar atelectasis in this region since the prior study. This probably represents an area of atelectasis but short-term chest CT follow-up is recommended (3 months). 2. Unchanged mild ground-glass opacities and interstitial opacities within both lungs, nonspecific but may represent edema. 3. Cardiomegaly and coronary artery disease. 4. Unchanged T11 and T12 compression fractures. 5. Aortic Atherosclerosis (ICD10-I70.0) and Emphysema (ICD10-J43.9). Electronically Signed   By: Margarette Canada M.D.   On: 02/06/2021 12:53   DG Chest Portable 1 View  Result Date: 02/06/2021 CLINICAL DATA:  Shortness of breath. EXAM: PORTABLE CHEST 1 VIEW COMPARISON:  Chest radiograph 08/25/2020. FINDINGS: Monitoring leads overlie the patient. Stable cardiomegaly. Tortuosity and calcification of the thoracic aorta. Emphysematous change. Interval development of diffuse bilateral interstitial opacities. No pleural effusion or pneumothorax. IMPRESSION: Diffuse bilateral interstitial opacities may represent multifocal infection or pulmonary edema. Electronically Signed   By: Lovey Newcomer M.D.   On: 02/06/2021 09:56    Pending Labs Unresulted Labs (From admission, onward)     Start     Ordered   02/13/21 0500  Creatinine, serum  (enoxaparin (LOVENOX)    CrCl >/= 30 ml/min)  Weekly,   R     Comments: while on enoxaparin therapy    02/06/21 1912   02/07/21 0500  Brain natriuretic peptide  Tomorrow morning,   R        02/06/21 1912            Vitals/Pain Today's Vitals   02/06/21 1730 02/06/21 1800 02/06/21 1830 02/06/21 1900  BP: (!) 147/92 (!) 144/92 (!) 151/99 (!) 150/94  Pulse: 98 89 89 91  Resp: (!) 21 16 20  (!) 22  Temp:      TempSrc:      SpO2: 96% 96% 96% 93%  Weight:      Height:      PainSc:        Isolation Precautions No active isolations  Medications Medications  methylPREDNISolone sodium succinate (SOLU-MEDROL) 125 mg/2 mL  injection 125 mg (125 mg Intravenous Not Given 02/06/21 0947)  acetaminophen (TYLENOL) tablet 650 mg (has no administration in time range)  aspirin EC tablet 81 mg (has no administration in time range)  celecoxib (CELEBREX) capsule 400 mg (400 mg Oral Given 02/06/21 2127)  oxyCODONE (Oxy IR/ROXICODONE) immediate release tablet 10 mg (10 mg Oral Given 02/06/21 1924)  hydrALAZINE (APRESOLINE) tablet 25 mg (25 mg Oral Given 02/06/21 2126)  nitroGLYCERIN (NITROSTAT) SL tablet 0.4 mg (has no administration in time range)  irbesartan (AVAPRO) tablet 150 mg (150 mg Oral Given 02/06/21 2126)  ARIPiprazole (ABILIFY) tablet 10 mg (10 mg Oral Given 02/06/21 2126)  sertraline (ZOLOFT) tablet 50 mg (50 mg Oral Given 02/06/21 2129)  pantoprazole (PROTONIX) EC tablet 40 mg (40 mg Oral Given 02/06/21 2127)  docusate sodium (COLACE) capsule 100 mg (has no administration in time range)  ferrous sulfate tablet 325 mg (325 mg Oral Given 02/06/21 2126)  lamoTRIgine (LAMICTAL) tablet 200 mg (200 mg Oral Given 02/06/21 2126)  tiZANidine (ZANAFLEX) tablet 4 mg (4 mg Oral Given 02/06/21 2125)  albuterol (PROVENTIL) (2.5 MG/3ML) 0.083% nebulizer solution 2.5 mg (has no administration in time range)  Budeson-Glycopyrrol-Formoterol 160-9-4.8 MCG/ACT AERO 2 puff (has no administration in time range)  montelukast (SINGULAIR) tablet 10 mg (has no administration in  time range)  polyethylene glycol 0.4% and propylene glycol 0.3% (SYSTANE) ophthalmic gel (has no administration in time range)  methylPREDNISolone sodium succinate (SOLU-MEDROL) 125 mg/2 mL injection 125 mg (has no administration in time range)    Followed by  predniSONE (DELTASONE) tablet 40 mg (has no administration in time range)  amLODipine (NORVASC) tablet 10 mg (10 mg Oral Given 02/06/21 2127)  atorvastatin (LIPITOR) tablet 20 mg (has no administration in time range)  levofloxacin (LEVAQUIN) IVPB 750 mg (750 mg Intravenous New Bag/Given 02/06/21 2150)  enoxaparin  (LOVENOX) injection 40 mg (40 mg Subcutaneous Given 02/06/21 2137)  potassium chloride (KLOR-CON) CR tablet 10 mEq (10 mEq Oral Given 02/06/21 2127)  oxyCODONE (Oxy IR/ROXICODONE) immediate release tablet 10 mg (10 mg Oral Given 02/06/21 1144)  iohexol (OMNIPAQUE) 300 MG/ML solution 100 mL (100 mLs Intravenous Contrast Given 02/06/21 1222)  HYDROmorphone (DILAUDID) injection 1 mg (1 mg Intravenous Given 02/06/21 1342)  furosemide (LASIX) injection 80 mg (80 mg Intravenous Given 02/06/21 1343)  heparin bolus via infusion 4,000 Units (4,000 Units Intravenous Bolus from Bag 02/06/21 1549)    Mobility walks with device Low fall risk   Focused Assessments    R Recommendations: See Admitting Provider Note  Report given to:   Additional Notes:

## 2021-02-06 NOTE — ED Notes (Signed)
Pt placed on [place] at 15L, Sa02 92%

## 2021-02-06 NOTE — ED Provider Notes (Addendum)
[place] EMERGENCY DEPARTMENT Provider Note   CSN: [id number] Arrival date & time: 02/06/21  0849     History  Chief Complaint  Patient presents with   Respiratory Distress    [name] is a 76 y.o. female.  Patient brought in by EMS for respiratory distress.  Patient normally on 2 L of oxygen at home.  Patient states for the past 3 weeks she has been struggling with some increased cough some breathing difficulties and also bilateral chest wall pain.  Made worse with coughing.  EMS stated that oxygen saturation were 80% on her 2 L.  Patient was placed on high percent nonrebreather arrived here that way.  And patient now on high flow nasal cannula oxygen sats are 92%.  Patient does feel better.  Patient denies any fevers.  Temp upon arrival here was 98.5.  Respiratory rate was 24 blood pressure 110/69.  Past medical history significant for COPD coronary artery disease congestive heart failure.  Patient status post inferior MI with PCI to right CEA in 2000.  Known to have combined congestive heart failure EF is 45 to 50%.  Coronary artery stenosis status post right carotid endarterectomy and 95.  Patient also has peripheral artery disease with moderate bilateral femoropopliteal disease treated medically.  Also with history of hypertension hyperlipidemia COPD as already mentioned and renal carcinoma status post left nephrectomy in 2014.  Her most recent evaluation for ischemia was nonexercise stress test in February 2019 which showed an EF of 51% fixed defect in the inferior wall which was felt to be reflective of her prior inferior MI.  Prior echocardiogram 2019 had shown it to be 45 to 50% as a mention.  Echocardiogram was repeated in March 2021 showed normal left ventricular function EF was 55 to 60%.  Lower extremity Dopplers in June showed stable moderate disease.  Patient last admitted in May 2022 for COPD exacerbation and pulmonary edema.  In August patient was  admitted for central cord syndrome and underwent posterior cervical C2-3, C3-4 arthrodesis.  And also had posterior cervical bilateral laminectomy on C2-C3-C4.      Home Medications Prior to Admission medications   Medication Sig Start Date End Date Taking? Authorizing Provider  acetaminophen (TYLENOL) 325 MG tablet Take 2 tablets (650 mg total) by mouth every 4 (four) hours as needed for mild pain ((score 1 to 3) or temp > 100.5). 09/17/20  Yes Angiulli, Lavon Paganini, PA-C  albuterol (PROVENTIL) (2.5 MG/3ML) 0.083% nebulizer solution Take 3 mLs (2.5 mg total) by nebulization every 6 (six) hours as needed for wheezing or shortness of breath. 12/23/20  Yes Icard, Bradley L, DO  albuterol (VENTOLIN HFA) 108 (90 Base) MCG/ACT inhaler Inhale 2 puffs into the lungs every 6 (six) hours as needed for wheezing. 09/17/20  Yes Angiulli, Lavon Paganini, PA-C  amlodipine-atorvastatin (CADUET) 10-20 MG tablet Take 1 tablet by mouth daily.   Yes [provider]  ARIPiprazole (ABILIFY) 10 MG tablet Take 1 tablet (10 mg total) by mouth daily. 09/17/20  Yes Angiulli, Lavon Paganini, PA-C  Ascorbic Acid (VITAMIN C) 1000 MG tablet Take 1,000 mg by mouth daily.   Yes [provider]  aspirin EC 81 MG tablet Take 81 mg by mouth every morning.   Yes [provider]  Budeson-Glycopyrrol-Formoterol (BREZTRI AEROSPHERE) 160-9-4.8 MCG/ACT AERO Inhale 2 puffs into the lungs in the morning and at bedtime. 09/17/20  Yes Angiulli, Lavon Paganini, PA-C  Calcium Carb-Cholecalciferol 600-400 MG-UNIT TABS Take 1 tablet  by mouth daily. 09/17/20  Yes Angiulli, Lavon Paganini, PA-C  celecoxib (CELEBREX) 200 MG capsule Take 400 mg by mouth daily.   Yes [provider]  Cholecalciferol (VITAMIN D3) 50 MCG (2000 UT) TABS Take 2,000 Units by mouth daily. 09/17/20  Yes Angiulli, Lavon Paganini, PA-C  denosumab (PROLIA) 60 MG/ML SOSY injection Inject 60 mg into the skin every 6 (six) months. 05/23/19  Yes [provider]  dexlansoprazole  (DEXILANT) 60 MG capsule Take 1 capsule (60 mg total) by mouth daily. 09/17/20  Yes Angiulli, Lavon Paganini, PA-C  docusate sodium (COLACE) 100 MG capsule Take 100 mg by mouth daily as needed for mild constipation.   Yes [provider]  ferrous sulfate 325 (65 FE) MG tablet Take 1 tablet (325 mg total) by mouth daily. 09/17/20  Yes Angiulli, Lavon Paganini, PA-C  furosemide (LASIX) 20 MG tablet Take 1 tablet (20 mg total) by mouth daily. 09/17/20  Yes Angiulli, Lavon Paganini, PA-C  hydrALAZINE (APRESOLINE) 25 MG tablet Take 1 tablet (25 mg total) by mouth 2 (two) times daily. 10/26/20  Yes [place], Peter M, MD  lamoTRIgine (LAMICTAL) 100 MG tablet Take 2 tablets (200 mg total) by mouth 2 (two) times daily. 09/17/20  Yes Angiulli, Lavon Paganini, PA-C  montelukast (SINGULAIR) 10 MG tablet Take 1 tablet (10 mg total) by mouth at bedtime. TAKE 1 TABLET BY MOUTH EVERYDAY AT BEDTIME Patient taking differently: Take 10 mg by mouth at bedtime. 11/03/20  Yes Icard, Octavio Graves, DO  Multiple Vitamin (MULTIVITAMIN) capsule Take 1 capsule by mouth daily.    Yes [provider]  nitroGLYCERIN (NITROSTAT) 0.4 MG SL tablet Place 1 tablet (0.4 mg total) under the tongue every 5 (five) minutes as needed for chest pain. 02/28/19  Yes [place], Lorelee Cover., PA-C  Omega-3 Fatty Acids (FISH OIL) 1000 MG CAPS Take 1,000 mg by mouth 2 (two) times daily.   Yes [provider]  Oxycodone HCl 10 MG TABS Take 1 tablet (10 mg total) by mouth every 4 (four) hours as needed for severe pain. 09/17/20  Yes Angiulli, Lavon Paganini, PA-C  Polyethyl Glycol-Propyl Glycol (SYSTANE) 0.4-0.3 % GEL ophthalmic gel Place 1 application into both eyes daily as needed (dry eyes).   Yes [provider]  potassium chloride (KLOR-CON) 8 MEQ tablet Take 1 tablet (8 mEq total) by mouth daily. 10/26/20  Yes [place], Peter M, MD  sertraline (ZOLOFT) 50 MG tablet Take 1 tablet (50 mg total) by mouth daily. 09/17/20  Yes Angiulli, Lavon Paganini, PA-C  tiZANidine  (ZANAFLEX) 4 MG tablet Take 1 tablet (4 mg total) by mouth every 6 (six) hours. 09/17/20  Yes Angiulli, Lavon Paganini, PA-C  valsartan (DIOVAN) 160 MG tablet Take 2 tablets (320 mg total) by mouth daily. 10/29/20  Yes [place], Peter M, MD  amLODipine (NORVASC) 5 MG tablet Take 1 tablet (5 mg total) by mouth daily. Patient not taking: Reported on 10/26/2020 09/17/20   Angiulli, Lavon Paganini, PA-C  atorvastatin (LIPITOR) 20 MG tablet Take 1 tablet (20 mg total) by mouth daily. Patient not taking: Reported on 11/30/2020 09/17/20   Angiulli, Lavon Paganini, PA-C  linaclotide [facility]) 145 MCG CAPS capsule Take 1 capsule (145 mcg total) by mouth daily before breakfast. Patient not taking: Reported on 11/30/2020 09/17/20   Angiulli, Lavon Paganini, PA-C  oxyCODONE (OXYCONTIN) 10 mg 12 hr tablet Take 1 tablet (10 mg total) by mouth every 12 (twelve) hours. Patient not taking: Reported on 11/30/2020 09/17/20   [place], Lavon Paganini,  PA-C  tamsulosin (FLOMAX) 0.4 MG CAPS capsule Take 1 capsule (0.4 mg total) by mouth daily after supper. Patient not taking: Reported on 11/30/2020 09/17/20   Angiulli, Lavon Paganini, PA-C      Allergies    Cephalexin, Doxycycline, Gabapentin, and Morphine and related    Review of Systems   Review of Systems  Constitutional:  Negative for chills and fever.  HENT:  Positive for congestion. Negative for ear pain and sore throat.   Eyes:  Negative for pain and visual disturbance.  Respiratory:  Positive for cough and shortness of breath.   Cardiovascular:  Positive for chest pain. Negative for palpitations.  Gastrointestinal:  Negative for abdominal pain and vomiting.  Genitourinary:  Negative for dysuria and hematuria.  Musculoskeletal:  Positive for back pain. Negative for arthralgias.  Skin:  Negative for color change and rash.  Neurological:  Negative for seizures and syncope.  All other systems reviewed and are negative.  Physical Exam Updated Vital Signs BP (!) 143/92    Pulse 96    Temp 98.5 F (36.9  C) (Oral)    Resp 19    Ht 1.6 m (5\' 3" )    Wt 84.5 kg    SpO2 96%    BMI 33.00 kg/m  Physical Exam Vitals and nursing note reviewed.  Constitutional:      General: She is in acute distress.     Appearance: Normal appearance. She is well-developed.  HENT:     Head: Normocephalic and atraumatic.  Eyes:     Extraocular Movements: Extraocular movements intact.     Conjunctiva/sclera: Conjunctivae normal.     Pupils: Pupils are equal, round, and reactive to light.  Cardiovascular:     Rate and Rhythm: Normal rate and regular rhythm.     Heart sounds: No murmur heard. Pulmonary:     Effort: Pulmonary effort is normal. No respiratory distress.     Breath sounds: Normal breath sounds. No wheezing, rhonchi or rales.  Abdominal:     Palpations: Abdomen is soft.     Tenderness: There is no abdominal tenderness.  Musculoskeletal:        General: No swelling.     Cervical back: Normal range of motion and neck supple.     Right lower leg: No edema.     Left lower leg: No edema.  Skin:    General: Skin is warm and dry.     Capillary Refill: Capillary refill takes less than 2 seconds.  Neurological:     General: No focal deficit present.     Mental Status: She is alert and oriented to person, place, and time.  Psychiatric:        Mood and Affect: Mood normal.    ED Results / Procedures / Treatments   Labs (all labs ordered are listed, but only abnormal results are displayed) Labs Reviewed  CBC - Abnormal; Notable for the following components:      Result Value   WBC 15.3 (*)    MCH 34.8 (*)    All other components within normal limits  BRAIN NATRIURETIC PEPTIDE - Abnormal; Notable for the following components:   B Natriuretic Peptide 172.0 (*)    All other components within normal limits  COMPREHENSIVE METABOLIC PANEL - Abnormal; Notable for the following components:   Glucose, Bld 148 (*)    Total Protein 6.3 (*)    Albumin 3.3 (*)    All other components within normal limits   TROPONIN I (HIGH SENSITIVITY) -  Abnormal; Notable for the following components:   Troponin I (High Sensitivity) 354 (*)    All other components within normal limits  RESP PANEL BY RT-PCR (FLU A&B, COVID) [id number]  TROPONIN I (HIGH SENSITIVITY)    EKG EKG Interpretation  Date/Time:  Saturday February 06 2021 08:51:46 EST Ventricular Rate:  94 PR Interval:  174 QRS Duration: 106 QT Interval:  356 QTC Calculation: 446 R Axis:   -49 Text Interpretation: Sinus rhythm LAD, consider left anterior fascicular block Probable anteroseptal infarct, recent Confirmed by Fredia Sorrow [phone]) on 02/06/2021 9:33:27 AM  Radiology CT Chest W Contrast  Result Date: 02/06/2021 CLINICAL DATA:  76 year old female with shortness of breath today. EXAM: CT CHEST WITH CONTRAST TECHNIQUE: Multidetector CT imaging of the chest was performed during intravenous contrast administration. RADIATION DOSE REDUCTION: This exam was performed according to the departmental dose-optimization program which includes automated exposure control, adjustment of the mA and/or kV according to patient size and/or use of iterative reconstruction technique. CONTRAST:  137mL OMNIPAQUE IOHEXOL 300 MG/ML  SOLN COMPARISON:  03/02/2020 and prior chest CTs. 02/06/2021 and prior chest radiographs. 08/25/2020 thoracic spine MR FINDINGS: Cardiovascular: Cardiomegaly again identified. Heavy coronary artery and aortic atherosclerotic calcifications are present. No thoracic aortic aneurysm or pericardial effusion identified. Mediastinum/Nodes: No enlarged mediastinal, hilar, or axillary lymph nodes. Thyroid gland, trachea, and esophagus demonstrate no significant findings. Lungs/Pleura: Moderate to severe emphysema again identified. Mild ground-glass opacities and interstitial opacities within both lungs are not significantly changed, nonspecific. A 1.5 cm nodular opacity is identified within the LEFT LOWER lobe (series 4: Image 17) but there is  increasing LEFT basilar atelectasis in this region since the prior study as well. There is no evidence of pulmonary mass, pneumothorax or pleural effusion. Upper Abdomen: No acute abnormality. Musculoskeletal: No acute abnormalities. T11 and T12 compression fractures do not appear significantly changed from 08/25/2020 MR IMPRESSION: 1. 1.5 cm nodular opacity within the LEFT LOWER lobe with increasing LEFT basilar atelectasis in this region since the prior study. This probably represents an area of atelectasis but short-term chest CT follow-up is recommended (3 months). 2. Unchanged mild ground-glass opacities and interstitial opacities within both lungs, nonspecific but may represent edema. 3. Cardiomegaly and coronary artery disease. 4. Unchanged T11 and T12 compression fractures. 5. Aortic Atherosclerosis (ICD10-I70.0) and Emphysema (ICD10-J43.9). Electronically Signed   By: Margarette Canada M.D.   On: 02/06/2021 12:53   DG Chest Portable 1 View  Result Date: 02/06/2021 CLINICAL DATA:  Shortness of breath. EXAM: PORTABLE CHEST 1 VIEW COMPARISON:  Chest radiograph 08/25/2020. FINDINGS: Monitoring leads overlie the patient. Stable cardiomegaly. Tortuosity and calcification of the thoracic aorta. Emphysematous change. Interval development of diffuse bilateral interstitial opacities. No pleural effusion or pneumothorax. IMPRESSION: Diffuse bilateral interstitial opacities may represent multifocal infection or pulmonary edema. Electronically Signed   By: Lovey Newcomer M.D.   On: 02/06/2021 09:56    Procedures Procedures    Medications Ordered in ED Medications  albuterol (VENTOLIN HFA) 108 (90 Base) MCG/ACT inhaler 2 puff (2 puffs Inhalation Given 02/06/21 0957)  methylPREDNISolone sodium succinate (SOLU-MEDROL) 125 mg/2 mL injection 125 mg (125 mg Intravenous Not Given 02/06/21 0947)  oxyCODONE (Oxy IR/ROXICODONE) immediate release tablet 10 mg (10 mg Oral Given 02/06/21 1144)  iohexol (OMNIPAQUE) 300 MG/ML  solution 100 mL (100 mLs Intravenous Contrast Given 02/06/21 1222)  HYDROmorphone (DILAUDID) injection 1 mg (1 mg Intravenous Given 02/06/21 1342)  furosemide (LASIX) injection 80 mg (80 mg Intravenous Given 02/06/21 1343)    ED  Course/ Medical Decision Making/ A&P                           Medical Decision Making Amount and/or Complexity of Data Reviewed Labs: ordered. Radiology: ordered.  Risk Prescription drug management. Decision regarding hospitalization.  CRITICAL CARE Performed by: Fredia Sorrow Total critical care time: 45 minutes Critical care time was exclusive of separately billable procedures and treating other patients. Critical care was necessary to treat or prevent imminent or life-threatening deterioration. Critical care was time spent personally by me on the following activities: development of treatment plan with patient and/or surrogate as well as nursing, discussions with consultants, evaluation of patient's response to treatment, examination of patient, obtaining history from patient or surrogate, ordering and performing treatments and interventions, ordering and review of laboratory studies, ordering and review of radiographic studies, pulse oximetry and re-evaluation of patient's condition.   Patient definitely arrived in respiratory distress.  But on high flow nasal cannula oxygen.  Patient is much more comfortable.  Oxygen sats in the low 90s.  Lungs clear bilaterally do not hear any rales or wheezing.  Patient felt as if there could have been some wheezing.  Patient also does have a history of congestive heart failure.  Chest x-ray raises concerns for multifocal pneumonia or possibly pulmonary edema.  Have ordered labs but do not have any back yet also ordered COVID and influenza testing.  Patient will most likely require admission.  We will going get CT of chest would prefer to do it with IV contrast if possible.  But need labs in order to make that  determination.  Patient's bilateral chest wall pain I feel is not cardiac in nature but chest wall in nature because it hurts more when she coughs.  We will check cardiac enzymes however.  EKG did raise some question of recent anterior septal infarct.  So troponins will be helpful with that.  When patient first got here.  Patient was treated with albuterol.  That was prior to me seeing her.  So is possible that could have cleared her lungs.  Patient will get Solu-Medrol ordered by me.  While the work-up is ongoing.  CT chest with contrast not consistent with pneumonia.  May have some slight pulmonary edema.  But BNP not significantly elevated.  Patient's renal function is normal.  We will give a dose of Lasix.  Troponin elevated first troponin elevated at 354.  This was checked because of the questionable anterior septal infarct on the EKG.  Delta troponin is pending.  Final Clinical Impression(s) / ED Diagnoses Final diagnoses:  Respiratory distress  Hypoxia  Acute respiratory failure with hypoxia (HCC)  COPD exacerbation (HCC)  Elevated troponin  Chronic pain syndrome    Rx / DC Orders ED Discharge Orders     None         Fredia Sorrow, MD 02/06/21 1338    Fredia Sorrow, MD 02/06/21 1357  Addendum:.  Patient's delta troponin is increased significantly of 485.  We will start heparin.  Patient not on blood thinners.  Will contact cardiology as well as hospitalist admitting service.    Fredia Sorrow, MD 02/06/21 [phone]   Discussed with hospitalist Dr. Linda Hedges who will admit.  Discussed with cardiology Dr. Asa Lente who will follow the patient in consultation.  He says that there is no need to go to Cath Lab at this point in time.  They will follow the serial troponins.  Patient  does require admission for the hypoxia presentation was probably an acute exacerbation of COPD.   Fredia Sorrow, MD 02/06/21 1523      Fredia Sorrow, MD 02/06/21 1622

## 2021-02-07 ENCOUNTER — Inpatient Hospital Stay (HOSPITAL_COMMUNITY): Payer: Medicare Other

## 2021-02-07 DIAGNOSIS — R778 Other specified abnormalities of plasma proteins: Secondary | ICD-10-CM

## 2021-02-07 DIAGNOSIS — R079 Chest pain, unspecified: Secondary | ICD-10-CM

## 2021-02-07 DIAGNOSIS — J9601 Acute respiratory failure with hypoxia: Secondary | ICD-10-CM

## 2021-02-07 DIAGNOSIS — I447 Left bundle-branch block, unspecified: Secondary | ICD-10-CM

## 2021-02-07 DIAGNOSIS — I10 Essential (primary) hypertension: Secondary | ICD-10-CM

## 2021-02-07 DIAGNOSIS — I214 Non-ST elevation (NSTEMI) myocardial infarction: Principal | ICD-10-CM

## 2021-02-07 DIAGNOSIS — I2510 Atherosclerotic heart disease of native coronary artery without angina pectoris: Secondary | ICD-10-CM

## 2021-02-07 DIAGNOSIS — I5021 Acute systolic (congestive) heart failure: Secondary | ICD-10-CM

## 2021-02-07 DIAGNOSIS — I5043 Acute on chronic combined systolic (congestive) and diastolic (congestive) heart failure: Secondary | ICD-10-CM

## 2021-02-07 LAB — ECHOCARDIOGRAM COMPLETE
AR max vel: 2.87 cm2
AV Area VTI: 2.79 cm2
AV Area mean vel: 2.78 cm2
AV Mean grad: 6 mmHg
AV Peak grad: 11.2 mmHg
Ao pk vel: 1.67 m/s
Area-P 1/2: 3.21 cm2
Height: 63 in
S' Lateral: 3.6 cm
Weight: 2941.82 oz

## 2021-02-07 LAB — BRAIN NATRIURETIC PEPTIDE: B Natriuretic Peptide: 1642.5 pg/mL — ABNORMAL HIGH (ref 0.0–100.0)

## 2021-02-07 LAB — BASIC METABOLIC PANEL
Anion gap: 11 (ref 5–15)
BUN: 17 mg/dL (ref 8–23)
CO2: 23 mmol/L (ref 22–32)
Calcium: 8.3 mg/dL — ABNORMAL LOW (ref 8.9–10.3)
Chloride: 98 mmol/L (ref 98–111)
Creatinine, Ser: 0.84 mg/dL (ref 0.44–1.00)
GFR, Estimated: >60 mL/min (ref >60)
Glucose, Bld: 156 mg/dL — ABNORMAL HIGH (ref 70–99)
Potassium: 4.3 mmol/L (ref 3.5–5.1)
Sodium: 132 mmol/L — ABNORMAL LOW (ref 135–145)

## 2021-02-07 LAB — TROPONIN I (HIGH SENSITIVITY): Troponin I (High Sensitivity): 1233 ng/L (ref <18)

## 2021-02-07 LAB — PROCALCITONIN: Procalcitonin: 0.25 ng/mL

## 2021-02-07 MED ORDER — ASPIRIN EC 81 MG PO TBEC
81.0000 mg | DELAYED_RELEASE_TABLET | Freq: Every day | ORAL | Status: DC
  Administered 2021-02-09: 81 mg via ORAL
  Filled 2021-02-07: qty 1

## 2021-02-07 MED ORDER — SODIUM CHLORIDE 0.9 % IV SOLN: 250.0000 mL | INTRAVENOUS | Status: DC | PRN

## 2021-02-07 MED ORDER — SODIUM CHLORIDE 0.9% FLUSH: 3.0000 mL | INTRAVENOUS | Status: DC | PRN

## 2021-02-07 MED ORDER — SODIUM CHLORIDE 0.9 % WEIGHT BASED INFUSION
1.0000 mL/kg/h | INTRAVENOUS | Status: DC
  Administered 2021-02-08: 1 mL/kg/h via INTRAVENOUS

## 2021-02-07 MED ORDER — PREDNISONE 20 MG PO TABS
40.0000 mg | ORAL_TABLET | Freq: Every day | ORAL | Status: DC
  Administered 2021-02-08 – 2021-02-09 (×2): 40 mg via ORAL
  Filled 2021-02-07 (×2): qty 2

## 2021-02-07 MED ORDER — SODIUM CHLORIDE 0.9% FLUSH
3.0000 mL | Freq: Two times a day (BID) | INTRAVENOUS | Status: DC
  Administered 2021-02-07: 3 mL via INTRAVENOUS

## 2021-02-07 MED ORDER — ASPIRIN 81 MG PO CHEW
81.0000 mg | CHEWABLE_TABLET | ORAL | Status: AC
  Administered 2021-02-08: 81 mg via ORAL
  Filled 2021-02-07: qty 1

## 2021-02-07 MED ORDER — FUROSEMIDE 10 MG/ML IJ SOLN
20.0000 mg | Freq: Once | INTRAMUSCULAR | Status: AC
  Administered 2021-02-07: 20 mg via INTRAVENOUS
  Filled 2021-02-07: qty 2

## 2021-02-07 MED ORDER — LEVOFLOXACIN 500 MG PO TABS
500.0000 mg | ORAL_TABLET | ORAL | Status: DC
  Administered 2021-02-07 – 2021-02-08 (×2): 500 mg via ORAL
  Filled 2021-02-07 (×2): qty 1

## 2021-02-07 MED ORDER — SODIUM CHLORIDE 0.9 % WEIGHT BASED INFUSION: 3.0000 mL/kg/h | INTRAVENOUS | Status: DC

## 2021-02-07 NOTE — Progress Notes (Signed)
Primary Cardiologist:  [place]  Subjective:  No chest pain  Breathing much better However enzymes elevating   Objective:  Vitals:   02/07/21 0450 02/07/21 0622 02/07/21 0911 02/07/21 0919  BP: 133/84 (!) 101/54 123/67 123/67  Pulse: 81 67  70  Resp: 19 19  18   Temp: (!) 97.5 F (36.4 C) 97.6 F (36.4 C)  98 F (36.7 C)  TempSrc: Oral Oral  Oral  SpO2: 94% 92%  95%  Weight:      Height:        Intake/Output from previous day:  Intake/Output Summary (Last 24 hours) at 02/07/2021 1121 Last data filed at 02/07/2021 0934 Gross per 24 hour  Intake 818.41 ml  Output 1700 ml  Net -881.59 ml    Physical Exam:  Chronically ill COPDEr Torticollis  Rhonchi / exp wheezes  Abdomen bengin Trace edema Good right radial pulse   Lab Results: Basic Metabolic Panel: Recent Labs    02/06/21 0905 02/07/21 0416  NA 138 132*  K 4.5 4.3  CL 106 98  CO2 22 23  GLUCOSE 148* 156*  BUN 14 17  CREATININE 0.96 0.84  CALCIUM 8.9 8.3*   Liver Function Tests: Recent Labs    02/06/21 0905  AST 24  ALT 25  ALKPHOS 111  BILITOT 0.5  PROT 6.3*  ALBUMIN 3.3*   No results for input(s): LIPASE, AMYLASE in the last 72 hours. CBC: Recent Labs    02/06/21 0905  WBC 15.3*  HGB 14.7  HCT 41.8  MCV 98.8  PLT 336     Imaging: CT Chest W Contrast  Result Date: 02/06/2021 CLINICAL DATA:  76 year old female with shortness of breath today. EXAM: CT CHEST WITH CONTRAST TECHNIQUE: Multidetector CT imaging of the chest was performed during intravenous contrast administration. RADIATION DOSE REDUCTION: This exam was performed according to the departmental dose-optimization program which includes automated exposure control, adjustment of the mA and/or kV according to patient size and/or use of iterative reconstruction technique. CONTRAST:  136mL OMNIPAQUE IOHEXOL 300 MG/ML  SOLN COMPARISON:  03/02/2020 and prior chest CTs. 02/06/2021 and prior chest radiographs. 08/25/2020 thoracic spine  MR FINDINGS: Cardiovascular: Cardiomegaly again identified. Heavy coronary artery and aortic atherosclerotic calcifications are present. No thoracic aortic aneurysm or pericardial effusion identified. Mediastinum/Nodes: No enlarged mediastinal, hilar, or axillary lymph nodes. Thyroid gland, trachea, and esophagus demonstrate no significant findings. Lungs/Pleura: Moderate to severe emphysema again identified. Mild ground-glass opacities and interstitial opacities within both lungs are not significantly changed, nonspecific. A 1.5 cm nodular opacity is identified within the LEFT LOWER lobe (series 4: Image 17) but there is increasing LEFT basilar atelectasis in this region since the prior study as well. There is no evidence of pulmonary mass, pneumothorax or pleural effusion. Upper Abdomen: No acute abnormality. Musculoskeletal: No acute abnormalities. T11 and T12 compression fractures do not appear significantly changed from 08/25/2020 MR IMPRESSION: 1. 1.5 cm nodular opacity within the LEFT LOWER lobe with increasing LEFT basilar atelectasis in this region since the prior study. This probably represents an area of atelectasis but short-term chest CT follow-up is recommended (3 months). 2. Unchanged mild ground-glass opacities and interstitial opacities within both lungs, nonspecific but may represent edema. 3. Cardiomegaly and coronary artery disease. 4. Unchanged T11 and T12 compression fractures. 5. Aortic Atherosclerosis (ICD10-I70.0) and Emphysema (ICD10-J43.9). Electronically Signed   By: Margarette Canada M.D.   On: 02/06/2021 12:53   DG Chest Portable 1 View  Result Date: 02/06/2021 CLINICAL DATA:  Shortness of  breath. EXAM: PORTABLE CHEST 1 VIEW COMPARISON:  Chest radiograph 08/25/2020. FINDINGS: Monitoring leads overlie the patient. Stable cardiomegaly. Tortuosity and calcification of the thoracic aorta. Emphysematous change. Interval development of diffuse bilateral interstitial opacities. No pleural  effusion or pneumothorax. IMPRESSION: Diffuse bilateral interstitial opacities may represent multifocal infection or pulmonary edema. Electronically Signed   By: Lovey Newcomer M.D.   On: 02/06/2021 09:56    Cardiac Studies:  ECG: SR new LBBB this am    Telemetry:  SR PVCls   Echo: pending   Medications:    amLODipine  10 mg Oral Daily   ARIPiprazole  10 mg Oral Daily   aspirin EC  81 mg Oral q morning   atorvastatin  20 mg Oral Daily   celecoxib  400 mg Oral Daily   enoxaparin (LOVENOX) injection  40 mg Subcutaneous Q24H   ferrous sulfate  325 mg Oral Daily   fluticasone furoate-vilanterol  1 puff Inhalation Daily   And   umeclidinium bromide  1 puff Inhalation Daily   hydrALAZINE  25 mg Oral BID   irbesartan  150 mg Oral Daily   lamoTRIgine  200 mg Oral BID   methylPREDNISolone (SOLU-MEDROL) injection  125 mg Intravenous Once   methylPREDNISolone (SOLU-MEDROL) injection  125 mg Intravenous Q12H   Followed by   Derrill Memo ON 02/09/2021] predniSONE  40 mg Oral Q breakfast   montelukast  10 mg Oral QHS   pantoprazole  40 mg Oral Daily   potassium chloride  10 mEq Oral Daily   sertraline  50 mg Oral Daily   tiZANidine  4 mg Oral Q6H      levofloxacin (LEVAQUIN) IV Stopped (02/06/21 2308)    Assessment/Plan:  [name] is a 76 y.o. female with a hx of Coronary artery disease status post inferior myocardial infarction treated with PCI to RCA in 2000, ischemic cardiomyopathy with improved EF, HFpEF, carotid artery disease status post right CEA in 1995, peripheral arterial disease (moderate bilateral femoropopliteal disease) treated medically, hypertension, hyperlipidemia, COPD on chronic O2, renal carcinoma status post left partial nephrectomy in 2014, cervical disc disease (admitted in 8/22 with central cord syndrome requiring surgery) who is being seen For dyspnea , chest pain and SEMI  SEMI:  Distant RCA stent. Troponin elevated to 1233 this am and ECG with new LBBB Echo pending  but suspect EF will be reduced. Her respiratory distress is much improved Heparin , lasix no beta blocker with respiratory distress wheezing and new LBBB will put on add on board for cath in am. Risks including stroke, MI, surgery, bleeding intubation and contrast reaction discussed willing to proceed. Able to lay flat this am  COPD:  chronic 2L [place] improved with lasix steroids, nebs and on Levaquin  CXR suggested multi focal pneumonia with edema CT with atelectasis and ground glass opacities both lungs sats better  HLD:  on statin   Jenkins Rouge 02/07/2021, 11:21 AM

## 2021-02-07 NOTE — Progress Notes (Signed)
PROGRESS NOTE  [name]  HOZ:[id number] DOB: 1945/01/30 DOA: 02/06/2021 PCP: Harlan Stains, MD  Outpatient Specialists: Cardiology, Dr. [place] Brief Narrative: [name] is a 76 y.o. female with a history of COPD/emphysema on 2L O2, CAD s/p MI 2000 w/PCI, RCC s/p nephrectomy, chronic back pain who presented to the ED by EMS with respiratory distress requiring NRB. She was afebrile with WBC 15.3k, CXR demonstrating bilateral interstitial infiltrates. CT chest showed GGOs and interstitial opacities concerning for pulmonary edema. ECG with sinus rhythm and LAD, troponin 354 > 485 and BNP elevated to 172. Flu and covid negative. She was admitted, given IV lasix, bronchodilators, and hypoxia and respiratory effort have improved. Overnight ECG confirmed new LBBB and cardiac enzymes trended further upward. Echocardiogram confirms septal apical and inferior basal hypokinesis, LVEF 40-45%. Heparin started and cardiology plans [facility] 1/23.   Assessment & Plan: Principal Problem:   COPD with acute exacerbation ([place]) Active Problems:   Coronary artery disease   HTN (hypertension)   Acute respiratory failure with hypoxia (HCC)   Acute on chronic combined systolic and diastolic CHF (congestive heart failure) (HCC)   Chronic diastolic congestive heart failure (HCC)  Acute on chronic hypoxic respiratory failure due to COPD exacerbation possibly due to pneumonia: Respiratory effort has stabilized, now down to 3.5L O2 (2L baseline).  - Radiographic findings are nonspecific and patient's clinical status improved rapidly. Will check PCT. If reassuring would deescalate abx. Regardless, we can reduce dose levaquin to 500mg  q24h. - Continue supplemental oxygen to maintain normal respiratory effort and SpO2 >89%. - Continue steroids, can taper to prednisone starting tomorrow - Continue regular breathing treatments, singulair  Acute myocardial infarction on CAD s/p PCI 2000: Troponin trended up  significantly with new LBBB on ECG. Fortunately, patient's symptoms are improved and vitals are stable.  - Start heparin IV. Holding beta blocker with COPD exacerbation. Continue statin.LHC planned 1/23.  - Echocardiogram STAT ordered, confirms wall motion abnormalities.  - Continue cardiac monitoring. Telemetry shows frequent PVCs/new IVCD.  Acute HFrEF: LVEF 40-45%, due to ICM. Diastolic parameters were said to be normal. - Responded well to IV lasix, creatinine improved. Mild edema, will give lasix 20mg  IV this AM. - Initiation of GDMT per cardiology. Holding ARB for risk of CIN, hold BB w/COPD exacerbation.   LLL pulmonary nodule: 1.5cm on CT 1/21.  - Follow up CT recommended in 3 months.   Chronic T11 and T12 compression fractures, chronic back/neck pain, torticollis:  - Follow up with Dr. Nelva Bush and PM&R, has an appointment with Dr. Dagoberto Ligas 2/17.  RCC s/p partial L nephrectomy 2014: - Diminished renal reserve noted. Will plan to hold ARB with CT w/contrast yesterday and LHC tomorrow.   GERD:  - PPI  Obesity: Estimated body mass index is 32.57 kg/m as calculated from the following:   Height as of this encounter: 5\' 3"  (1.6 m).   Weight as of this encounter: 83.4 kg.  DVT prophylaxis: Heparin IV Code Status: Full Family Communication: None at bedside Disposition Plan:  Status is: Inpatient  Remains inpatient appropriate because: Needs LHC, newly hypoxic.  Consultants:  Cardiology  Procedures:  LHC planned 02/08/2021  Antimicrobials: Levaquin 1/21 >>    Subjective: Breathing much better, had robust subjective UOP to lasix yesterday. No chest pain now or recently.   Objective: Vitals:   02/07/21 0450 02/07/21 0622 02/07/21 0911 02/07/21 0919  BP: 133/84 (!) 101/54 123/67 123/67  Pulse: 81 67  70  Resp: 19 19  18  Temp: (!) 97.5 F (36.4 C) 97.6 F (36.4 C)  98 F (36.7 C)  TempSrc: Oral Oral  Oral  SpO2: 94% 92%  95%  Weight:      Height:         Intake/Output Summary (Last 24 hours) at 02/07/2021 1156 Last data filed at 02/07/2021 0934 Gross per 24 hour  Intake 818.41 ml  Output 1700 ml  Net -881.59 ml   Filed Weights   02/06/21 0900 02/06/21 2253  Weight: 84.5 kg 83.4 kg    Gen: 76 y.o. female in no distress Neck: Significant spasmodic torticollis. Pulm: Non-labored breathing 3.5L O2, end-expiratory wheezes, bibasilar crackles.  CV: Regular rate and rhythm. No murmur, rub, or gallop. No JVD, trace pedal edema. GI: Abdomen soft, non-tender, non-distended, with normoactive bowel sounds. No organomegaly or masses felt. Ext: Warm, no deformities Skin: No rashes, lesions or ulcers on visualized skin. Neuro: Alert and oriented. No focal neurological deficits. Psych: Judgement and insight appear normal. Mood & affect appropriate.   Data Reviewed: I have personally reviewed following labs and imaging studies  CBC: Recent Labs  Lab 02/06/21 0905  WBC 15.3*  HGB 14.7  HCT 41.8  MCV 98.8  PLT 161   Basic Metabolic Panel: Recent Labs  Lab 02/06/21 0905 02/07/21 0416  NA 138 132*  K 4.5 4.3  CL 106 98  CO2 22 23  GLUCOSE 148* 156*  BUN 14 17  CREATININE 0.96 0.84  CALCIUM 8.9 8.3*   GFR: Estimated Creatinine Clearance: 58.3 mL/min (by C-G formula based on SCr of 0.84 mg/dL). Liver Function Tests: Recent Labs  Lab 02/06/21 0905  AST 24  ALT 25  ALKPHOS 111  BILITOT 0.5  PROT 6.3*  ALBUMIN 3.3*   No results for input(s): LIPASE, AMYLASE in the last 168 hours. No results for input(s): AMMONIA in the last 168 hours. Coagulation Profile: No results for input(s): INR, PROTIME in the last 168 hours. Cardiac Enzymes: No results for input(s): CKTOTAL, CKMB, CKMBINDEX, TROPONINI in the last 168 hours. BNP (last 3 results) No results for input(s): PROBNP in the last 8760 hours. HbA1C: No results for input(s): HGBA1C in the last 72 hours. CBG: No results for input(s): GLUCAP in the last 168 hours. Lipid  Profile: No results for input(s): CHOL, HDL, LDLCALC, TRIG, CHOLHDL, LDLDIRECT in the last 72 hours. Thyroid Function Tests: No results for input(s): TSH, T4TOTAL, FREET4, T3FREE, THYROIDAB in the last 72 hours. Anemia Panel: No results for input(s): VITAMINB12, FOLATE, FERRITIN, TIBC, IRON, RETICCTPCT in the last 72 hours. Urine analysis:    Component Value Date/Time   COLORURINE YELLOW 08/25/2020 2058   APPEARANCEUR CLEAR 08/25/2020 2058   LABSPEC 1.008 08/25/2020 2058   PHURINE 6.0 08/25/2020 2058   GLUCOSEU NEGATIVE 08/25/2020 2058   HGBUR NEGATIVE 08/25/2020 2058   [place] 08/25/2020 2058   [place] NEGATIVE 08/25/2020 2058   PROTEINUR NEGATIVE 08/25/2020 2058   UROBILINOGEN 0.2 03/07/2012 1234   NITRITE NEGATIVE 08/25/2020 2058   LEUKOCYTESUR TRACE (A) 08/25/2020 2058   Recent Results (from the past 240 hour(s))  Resp Panel by RT-PCR (Flu A&B, Covid) Nasopharyngeal Swab     Status: None   Collection Time: 02/06/21  9:55 AM   Specimen: Nasopharyngeal Swab; Nasopharyngeal(NP) swabs in vial transport medium  Result Value Ref Range Status   SARS Coronavirus 2 by RT PCR NEGATIVE NEGATIVE Final    Comment: (NOTE) SARS-CoV-2 target nucleic acids are NOT DETECTED.  The SARS-CoV-2 RNA is generally detectable in upper  respiratory specimens during the acute phase of infection. The lowest concentration of SARS-CoV-2 viral copies this assay can detect is 138 copies/mL. A negative result does not preclude SARS-Cov-2 infection and should not be used as the sole basis for treatment or other patient management decisions. A negative result may occur with  improper specimen collection/handling, submission of specimen other than nasopharyngeal swab, presence of viral mutation(s) within the areas targeted by this assay, and inadequate number of viral copies(<138 copies/mL). A negative result must be combined with clinical observations, patient history, and  epidemiological information. The expected result is Negative.  Fact Sheet for Patients:  EntrepreneurPulse.com.au  Fact Sheet for Healthcare Providers:  IncredibleEmployment.be  This test is no t yet approved or cleared by the [place] FDA and  has been authorized for detection and/or diagnosis of SARS-CoV-2 by FDA under an Emergency Use Authorization (EUA). This EUA will remain  in effect (meaning this test can be used) for the duration of the COVID-19 declaration under Section 564(b)(1) of the Act, 21 U.S.C.section 360bbb-3(b)(1), unless the authorization is terminated  or revoked sooner.       Influenza A by PCR NEGATIVE NEGATIVE Final   Influenza B by PCR NEGATIVE NEGATIVE Final    Comment: (NOTE) The Xpert Xpress SARS-CoV-2/FLU/RSV plus assay is intended as an aid in the diagnosis of influenza from Nasopharyngeal swab specimens and should not be used as a sole basis for treatment. Nasal washings and aspirates are unacceptable for Xpert Xpress SARS-CoV-2/FLU/RSV testing.  Fact Sheet for Patients: EntrepreneurPulse.com.au  Fact Sheet for Healthcare Providers: IncredibleEmployment.be  This test is not yet approved or cleared by the [place] FDA and has been authorized for detection and/or diagnosis of SARS-CoV-2 by FDA under an Emergency Use Authorization (EUA). This EUA will remain in effect (meaning this test can be used) for the duration of the COVID-19 declaration under Section 564(b)(1) of the Act, 21 U.S.C. section 360bbb-3(b)(1), unless the authorization is terminated or revoked.  Performed at [facility], [address] [place], [place] [address]       Radiology Studies: CT Chest W Contrast  Result Date: 02/06/2021 CLINICAL DATA:  76 year old female with shortness of breath today. EXAM: CT CHEST WITH CONTRAST TECHNIQUE: Multidetector CT imaging of the chest was performed  during intravenous contrast administration. RADIATION DOSE REDUCTION: This exam was performed according to the departmental dose-optimization program which includes automated exposure control, adjustment of the mA and/or kV according to patient size and/or use of iterative reconstruction technique. CONTRAST:  157mL OMNIPAQUE IOHEXOL 300 MG/ML  SOLN COMPARISON:  03/02/2020 and prior chest CTs. 02/06/2021 and prior chest radiographs. 08/25/2020 thoracic spine MR FINDINGS: Cardiovascular: Cardiomegaly again identified. Heavy coronary artery and aortic atherosclerotic calcifications are present. No thoracic aortic aneurysm or pericardial effusion identified. Mediastinum/Nodes: No enlarged mediastinal, hilar, or axillary lymph nodes. Thyroid gland, trachea, and esophagus demonstrate no significant findings. Lungs/Pleura: Moderate to severe emphysema again identified. Mild ground-glass opacities and interstitial opacities within both lungs are not significantly changed, nonspecific. A 1.5 cm nodular opacity is identified within the LEFT LOWER lobe (series 4: Image 17) but there is increasing LEFT basilar atelectasis in this region since the prior study as well. There is no evidence of pulmonary mass, pneumothorax or pleural effusion. Upper Abdomen: No acute abnormality. Musculoskeletal: No acute abnormalities. T11 and T12 compression fractures do not appear significantly changed from 08/25/2020 MR IMPRESSION: 1. 1.5 cm nodular opacity within the LEFT LOWER lobe with increasing LEFT basilar atelectasis in this  region since the prior study. This probably represents an area of atelectasis but short-term chest CT follow-up is recommended (3 months). 2. Unchanged mild ground-glass opacities and interstitial opacities within both lungs, nonspecific but may represent edema. 3. Cardiomegaly and coronary artery disease. 4. Unchanged T11 and T12 compression fractures. 5. Aortic Atherosclerosis (ICD10-I70.0) and Emphysema  (ICD10-J43.9). Electronically Signed   By: Margarette Canada M.D.   On: 02/06/2021 12:53   DG Chest Portable 1 View  Result Date: 02/06/2021 CLINICAL DATA:  Shortness of breath. EXAM: PORTABLE CHEST 1 VIEW COMPARISON:  Chest radiograph 08/25/2020. FINDINGS: Monitoring leads overlie the patient. Stable cardiomegaly. Tortuosity and calcification of the thoracic aorta. Emphysematous change. Interval development of diffuse bilateral interstitial opacities. No pleural effusion or pneumothorax. IMPRESSION: Diffuse bilateral interstitial opacities may represent multifocal infection or pulmonary edema. Electronically Signed   By: Lovey Newcomer M.D.   On: 02/06/2021 09:56   ECHOCARDIOGRAM COMPLETE  Result Date: 02/07/2021    ECHOCARDIOGRAM REPORT   Patient Name:   [name] [facility] Date of Exam: 02/07/2021 Medical Rec #:  [id number]        Height:       63.0 in Accession #:    [id number]       Weight:       183.9 lb Date of Birth:  07-27-1945        BSA:          1.866 m Patient Age:    68 years         BP:           123/67 mmHg Patient Gender: F                HR:           69 bpm. Exam Location:  Inpatient Procedure: 2D Echo, Cardiac Doppler and Color Doppler STAT ECHO Indications:    Chest Pain R07.9                 Elevated Troponin  History:        Patient has prior history of Echocardiogram examinations.                 Previous Myocardial Infarction, Acute MI and CAD; Risk                 Factors:Hypertension. GERD. Alcoholism.  Sonographer:    Merrie Roof RDCS Referring Phys: [place]  1. Septal apical and inferior basal hypokinesis . Left ventricular ejection fraction, by estimation, is 40 to 45%. The left ventricle has mildly decreased function. The left ventricle demonstrates regional wall motion abnormalities (see scoring diagram/findings for description). The left ventricular internal cavity size was mildly dilated. There is mild left ventricular hypertrophy. Left ventricular diastolic  parameters were normal.  2. Right ventricular systolic function is normal. The right ventricular size is normal. There is mildly elevated pulmonary artery systolic pressure.  3. Left atrial size was moderately dilated.  4. The mitral valve is abnormal. Mild mitral valve regurgitation. No evidence of mitral stenosis. Moderate mitral annular calcification.  5. Calcified non coronary cusp. The aortic valve is tricuspid. There is mild calcification of the aortic valve. There is mild thickening of the aortic valve. Aortic valve regurgitation is not visualized. Aortic valve sclerosis is present, with no evidence of aortic valve stenosis.  6. The inferior vena cava is normal in size with greater than 50% respiratory variability, suggesting right atrial pressure of 3 mmHg. FINDINGS  Left  Ventricle: Septal apical and inferior basal hypokinesis. Left ventricular ejection fraction, by estimation, is 40 to 45%. The left ventricle has mildly decreased function. The left ventricle demonstrates regional wall motion abnormalities. The left  ventricular internal cavity size was mildly dilated. There is mild left ventricular hypertrophy. Left ventricular diastolic parameters were normal. Right Ventricle: The right ventricular size is normal. No increase in right ventricular wall thickness. Right ventricular systolic function is normal. There is mildly elevated pulmonary artery systolic pressure. The tricuspid regurgitant velocity is 2.98  m/s, and with an assumed right atrial pressure of 3 mmHg, the estimated right ventricular systolic pressure is 47.4 mmHg. Left Atrium: Left atrial size was moderately dilated. Right Atrium: Right atrial size was normal in size. Pericardium: There is no evidence of pericardial effusion. Mitral Valve: The mitral valve is abnormal. There is mild thickening of the mitral valve leaflet(s). There is mild calcification of the mitral valve leaflet(s). Moderate mitral annular calcification. Mild mitral valve  regurgitation. No evidence of mitral  valve stenosis. Tricuspid Valve: The tricuspid valve is normal in structure. Tricuspid valve regurgitation is mild . No evidence of tricuspid stenosis. Aortic Valve: Calcified non coronary cusp. The aortic valve is tricuspid. There is mild calcification of the aortic valve. There is mild thickening of the aortic valve. Aortic valve regurgitation is not visualized. Aortic valve sclerosis is present, with  no evidence of aortic valve stenosis. Aortic valve mean gradient measures 6.0 mmHg. Aortic valve peak gradient measures 11.2 mmHg. Aortic valve area, by VTI measures 2.79 cm. Pulmonic Valve: The pulmonic valve was normal in structure. Pulmonic valve regurgitation is not visualized. No evidence of pulmonic stenosis. Aorta: The aortic root is normal in size and structure. Venous: The inferior vena cava is normal in size with greater than 50% respiratory variability, suggesting right atrial pressure of 3 mmHg. IAS/Shunts: No atrial level shunt detected by color flow Doppler.  LEFT VENTRICLE PLAX 2D LVIDd:         5.00 cm   Diastology LVIDs:         3.60 cm   LV e' medial:    5.98 cm/s LV PW:         1.20 cm   LV E/e' medial:  13.1 LV IVS:        1.30 cm   LV e' lateral:   9.25 cm/s LVOT diam:     2.20 cm   LV E/e' lateral: 8.5 LV SV:         93 LV SV Index:   50 LVOT Area:     3.80 cm  RIGHT VENTRICLE          IVC RV Basal diam:  3.70 cm  IVC diam: 2.30 cm LEFT ATRIUM           Index        RIGHT ATRIUM           Index LA diam:      4.40 cm 2.36 cm/m   RA Area:     20.70 cm LA Vol (A2C): 64.5 ml 34.57 ml/m  RA Volume:   60.80 ml  32.59 ml/m LA Vol (A4C): 68.7 ml 36.82 ml/m  AORTIC VALVE AV Area (Vmax):    2.87 cm AV Area (Vmean):   2.78 cm AV Area (VTI):     2.79 cm AV Vmax:           167.00 cm/s AV Vmean:          112.000 cm/s  AV VTI:            0.333 m AV Peak Grad:      11.2 mmHg AV Mean Grad:      6.0 mmHg LVOT Vmax:         126.00 cm/s LVOT Vmean:        81.800 cm/s  LVOT VTI:          0.244 m LVOT/AV VTI ratio: 0.73  AORTA Ao Asc diam: 3.40 cm MITRAL VALVE               TRICUSPID VALVE MV Area (PHT): 3.21 cm    TR Peak grad:   35.5 mmHg MV Decel Time: 236 msec    TR Vmax:        298.00 cm/s MV E velocity: 78.60 cm/s MV A velocity: 76.00 cm/s  SHUNTS MV E/A ratio:  1.03        Systemic VTI:  0.24 m                            Systemic Diam: 2.20 cm Jenkins Rouge MD Electronically signed by Jenkins Rouge MD Signature Date/Time: 02/07/2021/11:45:27 AM    Final     Scheduled Meds:  amLODipine  10 mg Oral Daily   ARIPiprazole  10 mg Oral Daily   aspirin EC  81 mg Oral q morning   atorvastatin  20 mg Oral Daily   celecoxib  400 mg Oral Daily   enoxaparin (LOVENOX) injection  40 mg Subcutaneous Q24H   ferrous sulfate  325 mg Oral Daily   fluticasone furoate-vilanterol  1 puff Inhalation Daily   And   umeclidinium bromide  1 puff Inhalation Daily   hydrALAZINE  25 mg Oral BID   irbesartan  150 mg Oral Daily   lamoTRIgine  200 mg Oral BID   methylPREDNISolone (SOLU-MEDROL) injection  125 mg Intravenous Once   methylPREDNISolone (SOLU-MEDROL) injection  125 mg Intravenous Q12H   Followed by   Derrill Memo ON 02/09/2021] predniSONE  40 mg Oral Q breakfast   montelukast  10 mg Oral QHS   pantoprazole  40 mg Oral Daily   potassium chloride  10 mEq Oral Daily   sertraline  50 mg Oral Daily   tiZANidine  4 mg Oral Q6H   Continuous Infusions:  levofloxacin (LEVAQUIN) IV Stopped (02/06/21 2308)     LOS: 1 day    Patrecia Pour, MD [facility] [email or web address] 02/07/2021, 11:56 AM

## 2021-02-07 NOTE — Progress Notes (Signed)
°  Echocardiogram 2D Echocardiogram has been performed.  Merrie Roof F 02/07/2021, 11:15 AM

## 2021-02-07 NOTE — H&P (View-Only) (Signed)
Primary Cardiologist:  Martinique  Subjective:  No chest pain  Breathing much better However enzymes elevating   Objective:  Vitals:   02/07/21 0450 02/07/21 0622 02/07/21 0911 02/07/21 0919  BP: 133/84 (!) 101/54 123/67 123/67  Pulse: 81 67  70  Resp: 19 19  18   Temp: (!) 97.5 F (36.4 C) 97.6 F (36.4 C)  98 F (36.7 C)  TempSrc: Oral Oral  Oral  SpO2: 94% 92%  95%  Weight:      Height:        Intake/Output from previous day:  Intake/Output Summary (Last 24 hours) at 02/07/2021 1121 Last data filed at 02/07/2021 0934 Gross per 24 hour  Intake 818.41 ml  Output 1700 ml  Net -881.59 ml    Physical Exam:  Chronically ill COPDEr Torticollis  Rhonchi / exp wheezes  Abdomen bengin Trace edema Good right radial pulse   Lab Results: Basic Metabolic Panel: Recent Labs    02/06/21 0905 02/07/21 0416  NA 138 132*  K 4.5 4.3  CL 106 98  CO2 22 23  GLUCOSE 148* 156*  BUN 14 17  CREATININE 0.96 0.84  CALCIUM 8.9 8.3*   Liver Function Tests: Recent Labs    02/06/21 0905  AST 24  ALT 25  ALKPHOS 111  BILITOT 0.5  PROT 6.3*  ALBUMIN 3.3*   No results for input(s): LIPASE, AMYLASE in the last 72 hours. CBC: Recent Labs    02/06/21 0905  WBC 15.3*  HGB 14.7  HCT 41.8  MCV 98.8  PLT 336     Imaging: CT Chest W Contrast  Result Date: 02/06/2021 CLINICAL DATA:  76 year old female with shortness of breath today. EXAM: CT CHEST WITH CONTRAST TECHNIQUE: Multidetector CT imaging of the chest was performed during intravenous contrast administration. RADIATION DOSE REDUCTION: This exam was performed according to the departmental dose-optimization program which includes automated exposure control, adjustment of the mA and/or kV according to patient size and/or use of iterative reconstruction technique. CONTRAST:  170mL OMNIPAQUE IOHEXOL 300 MG/ML  SOLN COMPARISON:  03/02/2020 and prior chest CTs. 02/06/2021 and prior chest radiographs. 08/25/2020 thoracic spine  MR FINDINGS: Cardiovascular: Cardiomegaly again identified. Heavy coronary artery and aortic atherosclerotic calcifications are present. No thoracic aortic aneurysm or pericardial effusion identified. Mediastinum/Nodes: No enlarged mediastinal, hilar, or axillary lymph nodes. Thyroid gland, trachea, and esophagus demonstrate no significant findings. Lungs/Pleura: Moderate to severe emphysema again identified. Mild ground-glass opacities and interstitial opacities within both lungs are not significantly changed, nonspecific. A 1.5 cm nodular opacity is identified within the LEFT LOWER lobe (series 4: Image 17) but there is increasing LEFT basilar atelectasis in this region since the prior study as well. There is no evidence of pulmonary mass, pneumothorax or pleural effusion. Upper Abdomen: No acute abnormality. Musculoskeletal: No acute abnormalities. T11 and T12 compression fractures do not appear significantly changed from 08/25/2020 MR IMPRESSION: 1. 1.5 cm nodular opacity within the LEFT LOWER lobe with increasing LEFT basilar atelectasis in this region since the prior study. This probably represents an area of atelectasis but short-term chest CT follow-up is recommended (3 months). 2. Unchanged mild ground-glass opacities and interstitial opacities within both lungs, nonspecific but may represent edema. 3. Cardiomegaly and coronary artery disease. 4. Unchanged T11 and T12 compression fractures. 5. Aortic Atherosclerosis (ICD10-I70.0) and Emphysema (ICD10-J43.9). Electronically Signed   By: Margarette Canada M.D.   On: 02/06/2021 12:53   DG Chest Portable 1 View  Result Date: 02/06/2021 CLINICAL DATA:  Shortness of  breath. EXAM: PORTABLE CHEST 1 VIEW COMPARISON:  Chest radiograph 08/25/2020. FINDINGS: Monitoring leads overlie the patient. Stable cardiomegaly. Tortuosity and calcification of the thoracic aorta. Emphysematous change. Interval development of diffuse bilateral interstitial opacities. No pleural  effusion or pneumothorax. IMPRESSION: Diffuse bilateral interstitial opacities may represent multifocal infection or pulmonary edema. Electronically Signed   By: Lovey Newcomer M.D.   On: 02/06/2021 09:56    Cardiac Studies:  ECG: SR new LBBB this am    Telemetry:  SR PVCls   Echo: pending   Medications:    amLODipine  10 mg Oral Daily   ARIPiprazole  10 mg Oral Daily   aspirin EC  81 mg Oral q morning   atorvastatin  20 mg Oral Daily   celecoxib  400 mg Oral Daily   enoxaparin (LOVENOX) injection  40 mg Subcutaneous Q24H   ferrous sulfate  325 mg Oral Daily   fluticasone furoate-vilanterol  1 puff Inhalation Daily   And   umeclidinium bromide  1 puff Inhalation Daily   hydrALAZINE  25 mg Oral BID   irbesartan  150 mg Oral Daily   lamoTRIgine  200 mg Oral BID   methylPREDNISolone (SOLU-MEDROL) injection  125 mg Intravenous Once   methylPREDNISolone (SOLU-MEDROL) injection  125 mg Intravenous Q12H   Followed by   Derrill Memo ON 02/09/2021] predniSONE  40 mg Oral Q breakfast   montelukast  10 mg Oral QHS   pantoprazole  40 mg Oral Daily   potassium chloride  10 mEq Oral Daily   sertraline  50 mg Oral Daily   tiZANidine  4 mg Oral Q6H      levofloxacin (LEVAQUIN) IV Stopped (02/06/21 2308)    Assessment/Plan:  [name] is a 76 y.o. female with a hx of Coronary artery disease status post inferior myocardial infarction treated with PCI to RCA in 2000, ischemic cardiomyopathy with improved EF, HFpEF, carotid artery disease status post right CEA in 1995, peripheral arterial disease (moderate bilateral femoropopliteal disease) treated medically, hypertension, hyperlipidemia, COPD on chronic O2, renal carcinoma status post left partial nephrectomy in 2014, cervical disc disease (admitted in 8/22 with central cord syndrome requiring surgery) who is being seen For dyspnea , chest pain and SEMI  SEMI:  Distant RCA stent. Troponin elevated to 1233 this am and ECG with new LBBB Echo pending  but suspect EF will be reduced. Her respiratory distress is much improved Heparin , lasix no beta blocker with respiratory distress wheezing and new LBBB will put on add on board for cath in am. Risks including stroke, MI, surgery, bleeding intubation and contrast reaction discussed willing to proceed. Able to lay flat this am  COPD:  chronic 2L [place] improved with lasix steroids, nebs and on Levaquin  CXR suggested multi focal pneumonia with edema CT with atelectasis and ground glass opacities both lungs sats better  HLD:  on statin   Jenkins Rouge 02/07/2021, 11:21 AM

## 2021-02-07 NOTE — Plan of Care (Signed)
  Problem: Activity: Goal: Risk for activity intolerance will decrease Outcome: Progressing   Problem: Nutrition: Goal: Adequate nutrition will be maintained Outcome: Progressing   

## 2021-02-07 NOTE — Progress Notes (Signed)
Clarified with MD,Rathore via secure chat and text page if need to give lovenox tonight. Patient is for cardiac cath tomorrow. Awaiting call back/order. 23:50 Received call from Rathore,MD advising this RN to clarify with Cardiology. Paged sent out to on call MD. Order received to go ahead and give lovenox.

## 2021-02-08 ENCOUNTER — Encounter (HOSPITAL_COMMUNITY)
Admission: EM | Disposition: A | Discharge status: Home / Self Care | Payer: Self-pay | Source: Home / Self Care | Attending: Family Medicine

## 2021-02-08 DIAGNOSIS — I2510 Atherosclerotic heart disease of native coronary artery without angina pectoris: Secondary | ICD-10-CM | POA: Diagnosis not present

## 2021-02-08 DIAGNOSIS — I5043 Acute on chronic combined systolic (congestive) and diastolic (congestive) heart failure: Secondary | ICD-10-CM | POA: Diagnosis not present

## 2021-02-08 DIAGNOSIS — I214 Non-ST elevation (NSTEMI) myocardial infarction: Secondary | ICD-10-CM | POA: Diagnosis not present

## 2021-02-08 HISTORY — PX: LEFT HEART CATH AND CORONARY ANGIOGRAPHY: CATH118249

## 2021-02-08 LAB — BASIC METABOLIC PANEL
Anion gap: 6 (ref 5–15)
BUN: 30 mg/dL — ABNORMAL HIGH (ref 8–23)
CO2: 22 mmol/L (ref 22–32)
Calcium: 7.6 mg/dL — ABNORMAL LOW (ref 8.9–10.3)
Chloride: 102 mmol/L (ref 98–111)
Creatinine, Ser: 0.97 mg/dL (ref 0.44–1.00)
GFR, Estimated: >60 mL/min (ref >60)
Glucose, Bld: 131 mg/dL — ABNORMAL HIGH (ref 70–99)
Potassium: 4 mmol/L (ref 3.5–5.1)
Sodium: 130 mmol/L — ABNORMAL LOW (ref 135–145)

## 2021-02-08 LAB — CBC
HCT: 36.9 % (ref 36.0–46.0)
Hemoglobin: 13.3 g/dL (ref 12.0–15.0)
MCH: 34.5 pg — ABNORMAL HIGH (ref 26.0–34.0)
MCHC: 36 g/dL (ref 30.0–36.0)
MCV: 95.8 fL (ref 80.0–100.0)
Platelets: 276 10*3/uL (ref 150–400)
RBC: 3.85 MIL/uL — ABNORMAL LOW (ref 3.87–5.11)
RDW: 13.5 % (ref 11.5–15.5)
WBC: 11.6 10*3/uL — ABNORMAL HIGH (ref 4.0–10.5)
nRBC: 0 % (ref 0.0–0.2)

## 2021-02-08 SURGERY — LEFT HEART CATH AND CORONARY ANGIOGRAPHY
Anesthesia: LOCAL

## 2021-02-08 MED ORDER — MIDAZOLAM HCL 2 MG/2ML IJ SOLN
INTRAMUSCULAR | Status: DC | PRN
  Administered 2021-02-08: 1 mg via INTRAVENOUS

## 2021-02-08 MED ORDER — ENOXAPARIN SODIUM 40 MG/0.4ML IJ SOSY
40.0000 mg | PREFILLED_SYRINGE | INTRAMUSCULAR | Status: DC
  Administered 2021-02-09: 40 mg via SUBCUTANEOUS
  Filled 2021-02-08: qty 0.4

## 2021-02-08 MED ORDER — LIDOCAINE HCL (PF) 1 % IJ SOLN
INTRAMUSCULAR | Status: DC | PRN
  Administered 2021-02-08: 2 mL

## 2021-02-08 MED ORDER — FENTANYL CITRATE (PF) 100 MCG/2ML IJ SOLN
INTRAMUSCULAR | Status: DC | PRN
  Administered 2021-02-08: 25 ug via INTRAVENOUS

## 2021-02-08 MED ORDER — LABETALOL HCL 5 MG/ML IV SOLN: 10.0000 mg | INTRAVENOUS | Status: AC | PRN

## 2021-02-08 MED ORDER — IOHEXOL 350 MG/ML SOLN
INTRAVENOUS | Status: DC | PRN
  Administered 2021-02-08: 70 mL

## 2021-02-08 MED ORDER — BISOPROLOL FUMARATE 5 MG PO TABS
5.0000 mg | ORAL_TABLET | Freq: Every day | ORAL | Status: DC
  Administered 2021-02-08: 5 mg via ORAL
  Filled 2021-02-08 (×3): qty 1

## 2021-02-08 MED ORDER — HEPARIN (PORCINE) IN NACL 1000-0.9 UT/500ML-% IV SOLN
INTRAVENOUS | Status: AC
  Filled 2021-02-08: qty 500

## 2021-02-08 MED ORDER — HEPARIN SODIUM (PORCINE) 1000 UNIT/ML IJ SOLN
INTRAMUSCULAR | Status: DC | PRN
  Administered 2021-02-08: 4000 [IU] via INTRAVENOUS

## 2021-02-08 MED ORDER — CLOPIDOGREL BISULFATE 75 MG PO TABS
ORAL_TABLET | ORAL | Status: AC
  Filled 2021-02-08: qty 1

## 2021-02-08 MED ORDER — SODIUM CHLORIDE 0.9% FLUSH: 3.0000 mL | INTRAVENOUS | Status: DC | PRN

## 2021-02-08 MED ORDER — FENTANYL CITRATE (PF) 100 MCG/2ML IJ SOLN
INTRAMUSCULAR | Status: AC
  Filled 2021-02-08: qty 2

## 2021-02-08 MED ORDER — HYDRALAZINE HCL 20 MG/ML IJ SOLN: 10.0000 mg | INTRAMUSCULAR | Status: AC | PRN

## 2021-02-08 MED ORDER — VERAPAMIL HCL 2.5 MG/ML IV SOLN
INTRAVENOUS | Status: DC | PRN
  Administered 2021-02-08: 10 mL via INTRA_ARTERIAL

## 2021-02-08 MED ORDER — LIDOCAINE HCL (PF) 1 % IJ SOLN
INTRAMUSCULAR | Status: AC
  Filled 2021-02-08: qty 30

## 2021-02-08 MED ORDER — ONDANSETRON HCL 4 MG/2ML IJ SOLN: 4.0000 mg | Freq: Four times a day (QID) | INTRAMUSCULAR | Status: DC | PRN

## 2021-02-08 MED ORDER — MIDAZOLAM HCL 2 MG/2ML IJ SOLN
INTRAMUSCULAR | Status: AC
  Filled 2021-02-08: qty 2

## 2021-02-08 MED ORDER — VERAPAMIL HCL 2.5 MG/ML IV SOLN
INTRAVENOUS | Status: AC
  Filled 2021-02-08: qty 2

## 2021-02-08 MED ORDER — HEPARIN (PORCINE) IN NACL 1000-0.9 UT/500ML-% IV SOLN
INTRAVENOUS | Status: DC | PRN
  Administered 2021-02-08 (×2): 500 mL

## 2021-02-08 MED ORDER — SODIUM CHLORIDE 0.9 % IV SOLN: 250.0000 mL | INTRAVENOUS | Status: DC | PRN

## 2021-02-08 MED ORDER — SODIUM CHLORIDE 0.9% FLUSH
3.0000 mL | Freq: Two times a day (BID) | INTRAVENOUS | Status: DC
  Administered 2021-02-08 – 2021-02-09 (×2): 3 mL via INTRAVENOUS

## 2021-02-08 MED ORDER — CLOPIDOGREL BISULFATE 75 MG PO TABS
75.0000 mg | ORAL_TABLET | Freq: Every day | ORAL | Status: DC
  Administered 2021-02-08 – 2021-02-09 (×2): 75 mg via ORAL
  Filled 2021-02-08 (×2): qty 1

## 2021-02-08 SURGICAL SUPPLY — 11 items
CATH 5FR JL3.5 JR4 ANG PIG MP (CATHETERS) ×2 IMPLANT
DEVICE RAD COMP TR BAND LRG (VASCULAR PRODUCTS) ×2 IMPLANT
GLIDESHEATH SLEND SS 6F .021 (SHEATH) ×2 IMPLANT
GUIDEWIRE INQWIRE 1.5J.035X260 (WIRE) IMPLANT
INQWIRE 1.5J .035X260CM (WIRE) ×3
KIT HEART LEFT (KITS) ×3 IMPLANT
PACK CARDIAC CATHETERIZATION (CUSTOM PROCEDURE TRAY) ×3 IMPLANT
SYR MEDRAD MARK 7 150ML (SYRINGE) ×3 IMPLANT
TRANSDUCER W/STOPCOCK (MISCELLANEOUS) ×3 IMPLANT
TUBING CIL FLEX 10 FLL-RA (TUBING) ×3 IMPLANT
WIRE HI TORQ VERSACORE-J 145CM (WIRE) ×2 IMPLANT

## 2021-02-08 NOTE — Progress Notes (Signed)
Progress Note  Patient Name: [name] Date of Encounter: 02/08/2021  [facility] Cardiologist: Peter Martinique, MD   Subjective   No chest pain, going to cat lab  Inpatient Medications    Scheduled Meds:  amLODipine  10 mg Oral Daily   ARIPiprazole  10 mg Oral Daily   [START ON 02/09/2021] aspirin EC  81 mg Oral Daily   atorvastatin  20 mg Oral Daily   celecoxib  400 mg Oral Daily   enoxaparin (LOVENOX) injection  40 mg Subcutaneous Q24H   ferrous sulfate  325 mg Oral Daily   fluticasone furoate-vilanterol  1 puff Inhalation Daily   And   umeclidinium bromide  1 puff Inhalation Daily   hydrALAZINE  25 mg Oral BID   lamoTRIgine  200 mg Oral BID   levofloxacin  500 mg Oral Q24H   montelukast  10 mg Oral QHS   pantoprazole  40 mg Oral Daily   potassium chloride  10 mEq Oral Daily   predniSONE  40 mg Oral Q breakfast   sertraline  50 mg Oral Daily   sodium chloride flush  3 mL Intravenous Q12H   tiZANidine  4 mg Oral Q6H   Continuous Infusions:  sodium chloride     sodium chloride 1 mL/kg/hr (02/08/21 0856)   PRN Meds: sodium chloride, acetaminophen, albuterol, docusate sodium, nitroGLYCERIN, oxyCODONE, polyvinyl alcohol, sodium chloride flush   Vital Signs    Vitals:   02/07/21 2020 02/08/21 0613 02/08/21 0913 02/08/21 0929  BP: 113/65 (!) 149/83 129/72   Pulse: 65 61 (!) 57   Resp: 18 19 20    Temp: 98.3 F (36.8 C) 97.6 F (36.4 C) 97.8 F (36.6 C)   TempSrc: Oral Oral Oral   SpO2: 92% 99% 98% 97%  Weight:      Height:        Intake/Output Summary (Last 24 hours) at 02/08/2021 1133 Last data filed at 02/08/2021 0842 Gross per 24 hour  Intake 780 ml  Output 1500 ml  Net -720 ml   Last 3 Weights 02/06/2021 02/06/2021 01/29/2021  Weight (lbs) 183 lb 13.8 oz 186 lb 4.6 oz 186 lb 3.2 oz  Weight (kg) 83.4 kg 84.5 kg 84.46 kg      Telemetry    Sinus - sinus bradycardia in the 50s - Personally Reviewed  ECG    No new tracings - Personally  Reviewed  Physical Exam   GEN: No acute distress.   Neck: No JVD, stiff neck since surgery Cardiac: RRR, no murmurs, rubs, or gallops.  Respiratory: Clear to auscultation bilaterally. GI: Soft, nontender, non-distended  MS: No edema; No deformity. Neuro:  Nonfocal  Psych: Normal affect   Labs    High Sensitivity Troponin:   Recent Labs  Lab 02/06/21 1155 02/06/21 1336 02/07/21 0416  TROPONINIHS 354* 485* 1,233*     Chemistry Recent Labs  Lab 02/06/21 0905 02/07/21 0416 02/08/21 0244  NA 138 132* 130*  K 4.5 4.3 4.0  CL 106 98 102  CO2 22 23 22   GLUCOSE 148* 156* 131*  BUN 14 17 30*  CREATININE 0.96 0.84 0.97  CALCIUM 8.9 8.3* 7.6*  PROT 6.3*  --   --   ALBUMIN 3.3*  --   --   AST 24  --   --   ALT 25  --   --   ALKPHOS 111  --   --   BILITOT 0.5  --   --   GFRNONAA >60 >  60 >60  ANIONGAP 10 11 6     Lipids No results for input(s): CHOL, TRIG, HDL, LABVLDL, LDLCALC, CHOLHDL in the last 168 hours.  Hematology Recent Labs  Lab 02/06/21 0905  WBC 15.3*  RBC 4.23  HGB 14.7  HCT 41.8  MCV 98.8  MCH 34.8*  MCHC 35.2  RDW 14.0  PLT 336   Thyroid No results for input(s): TSH, FREET4 in the last 168 hours.  BNP Recent Labs  Lab 02/06/21 0905 02/07/21 0416  BNP 172.0* 1,642.5*    DDimer No results for input(s): DDIMER in the last 168 hours.   Radiology    CT Chest W Contrast  Result Date: 02/06/2021 CLINICAL DATA:  76 year old female with shortness of breath today. EXAM: CT CHEST WITH CONTRAST TECHNIQUE: Multidetector CT imaging of the chest was performed during intravenous contrast administration. RADIATION DOSE REDUCTION: This exam was performed according to the departmental dose-optimization program which includes automated exposure control, adjustment of the mA and/or kV according to patient size and/or use of iterative reconstruction technique. CONTRAST:  130mL OMNIPAQUE IOHEXOL 300 MG/ML  SOLN COMPARISON:  03/02/2020 and prior chest CTs. 02/06/2021  and prior chest radiographs. 08/25/2020 thoracic spine MR FINDINGS: Cardiovascular: Cardiomegaly again identified. Heavy coronary artery and aortic atherosclerotic calcifications are present. No thoracic aortic aneurysm or pericardial effusion identified. Mediastinum/Nodes: No enlarged mediastinal, hilar, or axillary lymph nodes. Thyroid gland, trachea, and esophagus demonstrate no significant findings. Lungs/Pleura: Moderate to severe emphysema again identified. Mild ground-glass opacities and interstitial opacities within both lungs are not significantly changed, nonspecific. A 1.5 cm nodular opacity is identified within the LEFT LOWER lobe (series 4: Image 17) but there is increasing LEFT basilar atelectasis in this region since the prior study as well. There is no evidence of pulmonary mass, pneumothorax or pleural effusion. Upper Abdomen: No acute abnormality. Musculoskeletal: No acute abnormalities. T11 and T12 compression fractures do not appear significantly changed from 08/25/2020 MR IMPRESSION: 1. 1.5 cm nodular opacity within the LEFT LOWER lobe with increasing LEFT basilar atelectasis in this region since the prior study. This probably represents an area of atelectasis but short-term chest CT follow-up is recommended (3 months). 2. Unchanged mild ground-glass opacities and interstitial opacities within both lungs, nonspecific but may represent edema. 3. Cardiomegaly and coronary artery disease. 4. Unchanged T11 and T12 compression fractures. 5. Aortic Atherosclerosis (ICD10-I70.0) and Emphysema (ICD10-J43.9). Electronically Signed   By: Margarette Canada M.D.   On: 02/06/2021 12:53   ECHOCARDIOGRAM COMPLETE  Result Date: 02/07/2021    ECHOCARDIOGRAM REPORT   Patient Name:   [name] [facility] Date of Exam: 02/07/2021 Medical Rec #:  [id number]        Height:       63.0 in Accession #:    [id number]       Weight:       183.9 lb Date of Birth:  [birth year]        BSA:          1.866 m Patient Age:    76 years          BP:           123/67 mmHg Patient Gender: F                HR:           69 bpm. Exam Location:  Inpatient Procedure: 2D Echo, Cardiac Doppler and Color Doppler STAT ECHO Indications:    Chest Pain R07.9  Elevated Troponin  History:        Patient has prior history of Echocardiogram examinations.                 Previous Myocardial Infarction, Acute MI and CAD; Risk                 Factors:Hypertension. GERD. Alcoholism.  Sonographer:    Merrie Roof RDCS Referring Phys: [place]  1. Septal apical and inferior basal hypokinesis . Left ventricular ejection fraction, by estimation, is 40 to 45%. The left ventricle has mildly decreased function. The left ventricle demonstrates regional wall motion abnormalities (see scoring diagram/findings for description). The left ventricular internal cavity size was mildly dilated. There is mild left ventricular hypertrophy. Left ventricular diastolic parameters were normal.  2. Right ventricular systolic function is normal. The right ventricular size is normal. There is mildly elevated pulmonary artery systolic pressure.  3. Left atrial size was moderately dilated.  4. The mitral valve is abnormal. Mild mitral valve regurgitation. No evidence of mitral stenosis. Moderate mitral annular calcification.  5. Calcified non coronary cusp. The aortic valve is tricuspid. There is mild calcification of the aortic valve. There is mild thickening of the aortic valve. Aortic valve regurgitation is not visualized. Aortic valve sclerosis is present, with no evidence of aortic valve stenosis.  6. The inferior vena cava is normal in size with greater than 50% respiratory variability, suggesting right atrial pressure of 3 mmHg. FINDINGS  Left Ventricle: Septal apical and inferior basal hypokinesis. Left ventricular ejection fraction, by estimation, is 40 to 45%. The left ventricle has mildly decreased function. The left ventricle demonstrates regional wall  motion abnormalities. The left  ventricular internal cavity size was mildly dilated. There is mild left ventricular hypertrophy. Left ventricular diastolic parameters were normal. Right Ventricle: The right ventricular size is normal. No increase in right ventricular wall thickness. Right ventricular systolic function is normal. There is mildly elevated pulmonary artery systolic pressure. The tricuspid regurgitant velocity is 2.98  m/s, and with an assumed right atrial pressure of 3 mmHg, the estimated right ventricular systolic pressure is 67.3 mmHg. Left Atrium: Left atrial size was moderately dilated. Right Atrium: Right atrial size was normal in size. Pericardium: There is no evidence of pericardial effusion. Mitral Valve: The mitral valve is abnormal. There is mild thickening of the mitral valve leaflet(s). There is mild calcification of the mitral valve leaflet(s). Moderate mitral annular calcification. Mild mitral valve regurgitation. No evidence of mitral  valve stenosis. Tricuspid Valve: The tricuspid valve is normal in structure. Tricuspid valve regurgitation is mild . No evidence of tricuspid stenosis. Aortic Valve: Calcified non coronary cusp. The aortic valve is tricuspid. There is mild calcification of the aortic valve. There is mild thickening of the aortic valve. Aortic valve regurgitation is not visualized. Aortic valve sclerosis is present, with  no evidence of aortic valve stenosis. Aortic valve mean gradient measures 6.0 mmHg. Aortic valve peak gradient measures 11.2 mmHg. Aortic valve area, by VTI measures 2.79 cm. Pulmonic Valve: The pulmonic valve was normal in structure. Pulmonic valve regurgitation is not visualized. No evidence of pulmonic stenosis. Aorta: The aortic root is normal in size and structure. Venous: The inferior vena cava is normal in size with greater than 50% respiratory variability, suggesting right atrial pressure of 3 mmHg. IAS/Shunts: No atrial level shunt detected by  color flow Doppler.  LEFT VENTRICLE PLAX 2D LVIDd:         5.00 cm  Diastology LVIDs:         3.60 cm   LV e' medial:    5.98 cm/s LV PW:         1.20 cm   LV E/e' medial:  13.1 LV IVS:        1.30 cm   LV e' lateral:   9.25 cm/s LVOT diam:     2.20 cm   LV E/e' lateral: 8.5 LV SV:         93 LV SV Index:   50 LVOT Area:     3.80 cm  RIGHT VENTRICLE          IVC RV Basal diam:  3.70 cm  IVC diam: 2.30 cm LEFT ATRIUM           Index        RIGHT ATRIUM           Index LA diam:      4.40 cm 2.36 cm/m   RA Area:     20.70 cm LA Vol (A2C): 64.5 ml 34.57 ml/m  RA Volume:   60.80 ml  32.59 ml/m LA Vol (A4C): 68.7 ml 36.82 ml/m  AORTIC VALVE AV Area (Vmax):    2.87 cm AV Area (Vmean):   2.78 cm AV Area (VTI):     2.79 cm AV Vmax:           167.00 cm/s AV Vmean:          112.000 cm/s AV VTI:            0.333 m AV Peak Grad:      11.2 mmHg AV Mean Grad:      6.0 mmHg LVOT Vmax:         126.00 cm/s LVOT Vmean:        81.800 cm/s LVOT VTI:          0.244 m LVOT/AV VTI ratio: 0.73  AORTA Ao Asc diam: 3.40 cm MITRAL VALVE               TRICUSPID VALVE MV Area (PHT): 3.21 cm    TR Peak grad:   35.5 mmHg MV Decel Time: 236 msec    TR Vmax:        298.00 cm/s MV E velocity: 78.60 cm/s MV A velocity: 76.00 cm/s  SHUNTS MV E/A ratio:  1.03        Systemic VTI:  0.24 m                            Systemic Diam: 2.20 cm Jenkins Rouge MD Electronically signed by Jenkins Rouge MD Signature Date/Time: 02/07/2021/11:45:27 AM    Final     Cardiac Studies   Echo 02/07/21: 1. Septal apical and inferior basal hypokinesis . Left ventricular  ejection fraction, by estimation, is 40 to 45%. The left ventricle has  mildly decreased function. The left ventricle demonstrates regional wall  motion abnormalities (see scoring  diagram/findings for description). The left ventricular internal cavity  size was mildly dilated. There is mild left ventricular hypertrophy. Left  ventricular diastolic parameters were normal.   2. Right  ventricular systolic function is normal. The right ventricular  size is normal. There is mildly elevated pulmonary artery systolic  pressure.   3. Left atrial size was moderately dilated.   4. The mitral valve is abnormal. Mild mitral valve regurgitation. No  evidence of mitral stenosis. Moderate mitral annular calcification.  5. Calcified non coronary cusp. The aortic valve is tricuspid. There is  mild calcification of the aortic valve. There is mild thickening of the  aortic valve. Aortic valve regurgitation is not visualized. Aortic valve  sclerosis is present, with no  evidence of aortic valve stenosis.   6. The inferior vena cava is normal in size with greater than 50%  respiratory variability, suggesting right atrial pressure of 3 mmHg.   Patient Profile     76 y.o. female  with a hx of Coronary artery disease status post inferior myocardial infarction treated with PCI to RCA in 2000, ischemic cardiomyopathy with improved EF, HFpEF, carotid artery disease status post right CEA in 1995, peripheral arterial disease (moderate bilateral femoropopliteal disease) treated medically, hypertension, hyperlipidemia, COPD on chronic O2, renal carcinoma status post left partial nephrectomy in 2014, cervical disc disease (admitted in 8/22 with central cord syndrome requiring surgery) who is being seen for dyspnea and NSTEMI.  Assessment & Plan    NSTEMI CAD - she denies chest pain - HS troponin hs trended to 1233 - EKG with new LBBB - going to cath lab today - no BB given COPD - echo with new WMA and new reduced EF   Hypertension - avoid BB - PTA: was on amlodipine, hydralazine 25 mg BID, valsartan - BP controlled   Acute systolic heart failure - BNP 1642 - echo with LVEF 40-45% with RWMA - heart cath today - has received 80 mg IV lasix x 1, 20 mg IV lasix x 1 - lung sounds with wheezing and rhonchi - mild LE edema, R > L   Hyperlipidemia with LDL goal < 70 - need updated lipid  profile - continue 20 mg lipitor - may need higher dose depending on cath findings      For questions or updates, please contact [facility] Please consult [email or web address] for contact info under        Signed, Ledora Bottcher, PA  02/08/2021, 11:33 AM

## 2021-02-08 NOTE — Progress Notes (Signed)
PROGRESS NOTE  [name]  JKD:[id number] DOB: 1945-01-29 DOA: 02/06/2021 PCP: Harlan Stains, MD  Outpatient Specialists: Cardiology, Dr. [place] Brief Narrative: [name] is a 76 y.o. female with a history of COPD/emphysema on 2L O2, CAD s/p MI 2000 w/PCI, RCC s/p nephrectomy, chronic back pain who presented to the ED by EMS with respiratory distress requiring NRB. She was afebrile with WBC 15.3k, CXR demonstrating bilateral interstitial infiltrates. CT chest showed GGOs and interstitial opacities concerning for pulmonary edema. ECG with sinus rhythm and LAD, troponin 354 > 485 and BNP elevated to 172. Flu and covid negative. She was admitted, given IV lasix, bronchodilators, and hypoxia and respiratory effort have improved. Overnight ECG confirmed new LBBB and cardiac enzymes trended further upward. Echocardiogram confirmed septal apical and inferior basal hypokinesis, LVEF 40-45%. Cardiology plans [facility] 1/23.   Assessment & Plan: Principal Problem:   COPD with acute exacerbation ([place]) Active Problems:   Coronary artery disease   HTN (hypertension)   Acute respiratory failure with hypoxia (HCC)   Acute on chronic combined systolic and diastolic CHF (congestive heart failure) (HCC)   Chronic diastolic congestive heart failure (HCC)  Acute on chronic hypoxic respiratory failure due to COPD exacerbation possibly due to pneumonia: Respiratory effort has stabilized, now down to 3.5L O2 (2L baseline).  - PCT 0.25. Continue levaquin 500mg  q24h, planning 5 days Tx. - Continue supplemental oxygen to maintain normal respiratory effort and SpO2 >89%. - Continue steroids > prednisone starting 1/23. - Continue regular breathing treatments, singulair  Acute myocardial infarction on CAD s/p PCI 2000: Troponin trended up significantly with new LBBB on ECG. Fortunately, patient's symptoms are improved and vitals are stable.  - Started heparin IV, though it appears cardiology feels lovenox 40mg   q24h is sufficient. Defer to them. Holding beta blocker with COPD exacerbation. Continue statin. LHC planned 1/23.  - Echocardiogram confirmed new wall motion abnormalities.  - Continue cardiac monitoring.    Acute HFrEF: LVEF 40-45%, due to ICM. Diastolic parameters were said to be normal. - Diuresed effectively with lasix, appears euvolemic. Will hold further lasix at this time. - Initiation of GDMT per cardiology. Holding ARB for risk of CIN, hold BB w/COPD exacerbation.   LLL pulmonary nodule: 1.5cm on CT 1/21.  - Follow up CT recommended in 3 months.   Chronic T11 and T12 compression fractures, chronic back/neck pain, torticollis:  - Follow up with Dr. Nelva Bush and PM&R, has an appointment with Dr. Dagoberto Ligas 2/17.  RCC s/p partial L nephrectomy 2014: - Diminished renal reserve noted. Holding ARB with CT w/contrast on admission and LHC plans.  GERD:  - PPI  Depression: Quiescent.  - Continue home meds.  Obesity: Estimated body mass index is 32.57 kg/m as calculated from the following:   Height as of this encounter: 5\' 3"  (1.6 m).   Weight as of this encounter: 83.4 kg.  DVT prophylaxis: Heparin IV > lovenox Code Status: Full Family Communication: None at bedside Disposition Plan:  Status is: Inpatient  Remains inpatient appropriate because: Needs LHC  Consultants:  Cardiology  Procedures:  LHC planned 02/08/2021  Antimicrobials: Levaquin 1/21 - 1/25  Subjective: Feels well, breathing near normal. No swelling in legs. No chest pain. Didn't sleep much, but otherwise without complaints.   Objective: Vitals:   02/07/21 2020 02/08/21 0613 02/08/21 0913 02/08/21 0929  BP: 113/65 (!) 149/83 129/72   Pulse: 65 61 (!) 57   Resp: 18 19 20    Temp: 98.3 F (36.8 C) 97.6 F (  36.4 C) 97.8 F (36.6 C)   TempSrc: Oral Oral Oral   SpO2: 92% 99% 98% 97%  Weight:      Height:        Intake/Output Summary (Last 24 hours) at 02/08/2021 1035 Last data filed at 02/08/2021  3299 Gross per 24 hour  Intake 780 ml  Output 1500 ml  Net -720 ml   Filed Weights   02/06/21 0900 02/06/21 2253  Weight: 84.5 kg 83.4 kg   Gen: 76 y.o. female in no distress Pulm: Nonlabored breathing 3.5L O2 without wheezes or crackles anterolaterally. 95% on monitor. CV: Regular rate and rhythm. No murmur, rub, or gallop. No JVD, very minimal dependent edema. GI: Abdomen soft, non-tender, non-distended, with normoactive bowel sounds.  Ext: Warm, no new deformities. Torticollis stable. Skin: No new rashes, lesions or ulcers on visualized skin. Neuro: Alert and oriented. No focal neurological deficits. Psych: Judgement and insight appear fair. Mood euthymic & affect congruent. Behavior is appropriate.    Data Reviewed: I have personally reviewed following labs and imaging studies  CBC: Recent Labs  Lab 02/06/21 0905  WBC 15.3*  HGB 14.7  HCT 41.8  MCV 98.8  PLT 242   Basic Metabolic Panel: Recent Labs  Lab 02/06/21 0905 02/07/21 0416 02/08/21 0244  NA 138 132* 130*  K 4.5 4.3 4.0  CL 106 98 102  CO2 22 23 22   GLUCOSE 148* 156* 131*  BUN 14 17 30*  CREATININE 0.96 0.84 0.97  CALCIUM 8.9 8.3* 7.6*   GFR: Estimated Creatinine Clearance: 50.5 mL/min (by C-G formula based on SCr of 0.97 mg/dL). Liver Function Tests: Recent Labs  Lab 02/06/21 0905  AST 24  ALT 25  ALKPHOS 111  BILITOT 0.5  PROT 6.3*  ALBUMIN 3.3*   No results for input(s): LIPASE, AMYLASE in the last 168 hours. No results for input(s): AMMONIA in the last 168 hours. Coagulation Profile: No results for input(s): INR, PROTIME in the last 168 hours. Cardiac Enzymes: No results for input(s): CKTOTAL, CKMB, CKMBINDEX, TROPONINI in the last 168 hours. BNP (last 3 results) No results for input(s): PROBNP in the last 8760 hours. HbA1C: No results for input(s): HGBA1C in the last 72 hours. CBG: No results for input(s): GLUCAP in the last 168 hours. Lipid Profile: No results for input(s):  CHOL, HDL, LDLCALC, TRIG, CHOLHDL, LDLDIRECT in the last 72 hours. Thyroid Function Tests: No results for input(s): TSH, T4TOTAL, FREET4, T3FREE, THYROIDAB in the last 72 hours. Anemia Panel: No results for input(s): VITAMINB12, FOLATE, FERRITIN, TIBC, IRON, RETICCTPCT in the last 72 hours. Urine analysis:    Component Value Date/Time   COLORURINE YELLOW 08/25/2020 2058   APPEARANCEUR CLEAR 08/25/2020 2058   LABSPEC 1.008 08/25/2020 2058   PHURINE 6.0 08/25/2020 2058   GLUCOSEU NEGATIVE 08/25/2020 2058   HGBUR NEGATIVE 08/25/2020 2058   [place] 08/25/2020 2058   [place] NEGATIVE 08/25/2020 2058   PROTEINUR NEGATIVE 08/25/2020 2058   UROBILINOGEN 0.2 03/07/2012 1234   NITRITE NEGATIVE 08/25/2020 2058   LEUKOCYTESUR TRACE (A) 08/25/2020 2058   Recent Results (from the past 240 hour(s))  Resp Panel by RT-PCR (Flu A&B, Covid) Nasopharyngeal Swab     Status: None   Collection Time: 02/06/21  9:55 AM   Specimen: Nasopharyngeal Swab; Nasopharyngeal(NP) swabs in vial transport medium  Result Value Ref Range Status   SARS Coronavirus 2 by RT PCR NEGATIVE NEGATIVE Final    Comment: (NOTE) SARS-CoV-2 target nucleic acids are NOT DETECTED.  The SARS-CoV-2  RNA is generally detectable in upper respiratory specimens during the acute phase of infection. The lowest concentration of SARS-CoV-2 viral copies this assay can detect is 138 copies/mL. A negative result does not preclude SARS-Cov-2 infection and should not be used as the sole basis for treatment or other patient management decisions. A negative result may occur with  improper specimen collection/handling, submission of specimen other than nasopharyngeal swab, presence of viral mutation(s) within the areas targeted by this assay, and inadequate number of viral copies(<138 copies/mL). A negative result must be combined with clinical observations, patient history, and epidemiological information. The expected result is  Negative.  Fact Sheet for Patients:  EntrepreneurPulse.com.au  Fact Sheet for Healthcare Providers:  IncredibleEmployment.be  This test is no t yet approved or cleared by the [place] FDA and  has been authorized for detection and/or diagnosis of SARS-CoV-2 by FDA under an Emergency Use Authorization (EUA). This EUA will remain  in effect (meaning this test can be used) for the duration of the COVID-19 declaration under Section 564(b)(1) of the Act, 21 U.S.C.section 360bbb-3(b)(1), unless the authorization is terminated  or revoked sooner.       Influenza A by PCR NEGATIVE NEGATIVE Final   Influenza B by PCR NEGATIVE NEGATIVE Final    Comment: (NOTE) The Xpert Xpress SARS-CoV-2/FLU/RSV plus assay is intended as an aid in the diagnosis of influenza from Nasopharyngeal swab specimens and should not be used as a sole basis for treatment. Nasal washings and aspirates are unacceptable for Xpert Xpress SARS-CoV-2/FLU/RSV testing.  Fact Sheet for Patients: EntrepreneurPulse.com.au  Fact Sheet for Healthcare Providers: IncredibleEmployment.be  This test is not yet approved or cleared by the [place] FDA and has been authorized for detection and/or diagnosis of SARS-CoV-2 by FDA under an Emergency Use Authorization (EUA). This EUA will remain in effect (meaning this test can be used) for the duration of the COVID-19 declaration under Section 564(b)(1) of the Act, 21 U.S.C. section 360bbb-3(b)(1), unless the authorization is terminated or revoked.  Performed at [facility], [place] [address] [place], [place] [address]       Radiology Studies: CT Chest W Contrast  Result Date: 02/06/2021 CLINICAL DATA:  76 year old female with shortness of breath today. EXAM: CT CHEST WITH CONTRAST TECHNIQUE: Multidetector CT imaging of the chest was performed during intravenous contrast administration.  RADIATION DOSE REDUCTION: This exam was performed according to the departmental dose-optimization program which includes automated exposure control, adjustment of the mA and/or kV according to patient size and/or use of iterative reconstruction technique. CONTRAST:  153mL OMNIPAQUE IOHEXOL 300 MG/ML  SOLN COMPARISON:  03/02/2020 and prior chest CTs. 02/06/2021 and prior chest radiographs. 08/25/2020 thoracic spine MR FINDINGS: Cardiovascular: Cardiomegaly again identified. Heavy coronary artery and aortic atherosclerotic calcifications are present. No thoracic aortic aneurysm or pericardial effusion identified. Mediastinum/Nodes: No enlarged mediastinal, hilar, or axillary lymph nodes. Thyroid gland, trachea, and esophagus demonstrate no significant findings. Lungs/Pleura: Moderate to severe emphysema again identified. Mild ground-glass opacities and interstitial opacities within both lungs are not significantly changed, nonspecific. A 1.5 cm nodular opacity is identified within the LEFT LOWER lobe (series 4: Image 17) but there is increasing LEFT basilar atelectasis in this region since the prior study as well. There is no evidence of pulmonary mass, pneumothorax or pleural effusion. Upper Abdomen: No acute abnormality. Musculoskeletal: No acute abnormalities. T11 and T12 compression fractures do not appear significantly changed from 08/25/2020 MR IMPRESSION: 1. 1.5 cm nodular opacity within the LEFT LOWER lobe with  increasing LEFT basilar atelectasis in this region since the prior study. This probably represents an area of atelectasis but short-term chest CT follow-up is recommended (3 months). 2. Unchanged mild ground-glass opacities and interstitial opacities within both lungs, nonspecific but may represent edema. 3. Cardiomegaly and coronary artery disease. 4. Unchanged T11 and T12 compression fractures. 5. Aortic Atherosclerosis (ICD10-I70.0) and Emphysema (ICD10-J43.9). Electronically Signed   By: Margarette Canada  M.D.   On: 02/06/2021 12:53   ECHOCARDIOGRAM COMPLETE  Result Date: 02/07/2021    ECHOCARDIOGRAM REPORT   Patient Name:   [name] [facility] Date of Exam: 02/07/2021 Medical Rec #:  [id number]        Height:       63.0 in Accession #:    [id number]       Weight:       183.9 lb Date of Birth:  Sep 29, 1945        BSA:          1.866 m Patient Age:    27 years         BP:           123/67 mmHg Patient Gender: F                HR:           69 bpm. Exam Location:  Inpatient Procedure: 2D Echo, Cardiac Doppler and Color Doppler STAT ECHO Indications:    Chest Pain R07.9                 Elevated Troponin  History:        Patient has prior history of Echocardiogram examinations.                 Previous Myocardial Infarction, Acute MI and CAD; Risk                 Factors:Hypertension. GERD. Alcoholism.  Sonographer:    Merrie Roof RDCS Referring Phys: [place]  1. Septal apical and inferior basal hypokinesis . Left ventricular ejection fraction, by estimation, is 40 to 45%. The left ventricle has mildly decreased function. The left ventricle demonstrates regional wall motion abnormalities (see scoring diagram/findings for description). The left ventricular internal cavity size was mildly dilated. There is mild left ventricular hypertrophy. Left ventricular diastolic parameters were normal.  2. Right ventricular systolic function is normal. The right ventricular size is normal. There is mildly elevated pulmonary artery systolic pressure.  3. Left atrial size was moderately dilated.  4. The mitral valve is abnormal. Mild mitral valve regurgitation. No evidence of mitral stenosis. Moderate mitral annular calcification.  5. Calcified non coronary cusp. The aortic valve is tricuspid. There is mild calcification of the aortic valve. There is mild thickening of the aortic valve. Aortic valve regurgitation is not visualized. Aortic valve sclerosis is present, with no evidence of aortic valve stenosis.  6. The  inferior vena cava is normal in size with greater than 50% respiratory variability, suggesting right atrial pressure of 3 mmHg. FINDINGS  Left Ventricle: Septal apical and inferior basal hypokinesis. Left ventricular ejection fraction, by estimation, is 40 to 45%. The left ventricle has mildly decreased function. The left ventricle demonstrates regional wall motion abnormalities. The left  ventricular internal cavity size was mildly dilated. There is mild left ventricular hypertrophy. Left ventricular diastolic parameters were normal. Right Ventricle: The right ventricular size is normal. No increase in right ventricular wall thickness. Right ventricular systolic function is normal. There  is mildly elevated pulmonary artery systolic pressure. The tricuspid regurgitant velocity is 2.98  m/s, and with an assumed right atrial pressure of 3 mmHg, the estimated right ventricular systolic pressure is 38.1 mmHg. Left Atrium: Left atrial size was moderately dilated. Right Atrium: Right atrial size was normal in size. Pericardium: There is no evidence of pericardial effusion. Mitral Valve: The mitral valve is abnormal. There is mild thickening of the mitral valve leaflet(s). There is mild calcification of the mitral valve leaflet(s). Moderate mitral annular calcification. Mild mitral valve regurgitation. No evidence of mitral  valve stenosis. Tricuspid Valve: The tricuspid valve is normal in structure. Tricuspid valve regurgitation is mild . No evidence of tricuspid stenosis. Aortic Valve: Calcified non coronary cusp. The aortic valve is tricuspid. There is mild calcification of the aortic valve. There is mild thickening of the aortic valve. Aortic valve regurgitation is not visualized. Aortic valve sclerosis is present, with  no evidence of aortic valve stenosis. Aortic valve mean gradient measures 6.0 mmHg. Aortic valve peak gradient measures 11.2 mmHg. Aortic valve area, by VTI measures 2.79 cm. Pulmonic Valve: The  pulmonic valve was normal in structure. Pulmonic valve regurgitation is not visualized. No evidence of pulmonic stenosis. Aorta: The aortic root is normal in size and structure. Venous: The inferior vena cava is normal in size with greater than 50% respiratory variability, suggesting right atrial pressure of 3 mmHg. IAS/Shunts: No atrial level shunt detected by color flow Doppler.  LEFT VENTRICLE PLAX 2D LVIDd:         5.00 cm   Diastology LVIDs:         3.60 cm   LV e' medial:    5.98 cm/s LV PW:         1.20 cm   LV E/e' medial:  13.1 LV IVS:        1.30 cm   LV e' lateral:   9.25 cm/s LVOT diam:     2.20 cm   LV E/e' lateral: 8.5 LV SV:         93 LV SV Index:   50 LVOT Area:     3.80 cm  RIGHT VENTRICLE          IVC RV Basal diam:  3.70 cm  IVC diam: 2.30 cm LEFT ATRIUM           Index        RIGHT ATRIUM           Index LA diam:      4.40 cm 2.36 cm/m   RA Area:     20.70 cm LA Vol (A2C): 64.5 ml 34.57 ml/m  RA Volume:   60.80 ml  32.59 ml/m LA Vol (A4C): 68.7 ml 36.82 ml/m  AORTIC VALVE AV Area (Vmax):    2.87 cm AV Area (Vmean):   2.78 cm AV Area (VTI):     2.79 cm AV Vmax:           167.00 cm/s AV Vmean:          112.000 cm/s AV VTI:            0.333 m AV Peak Grad:      11.2 mmHg AV Mean Grad:      6.0 mmHg LVOT Vmax:         126.00 cm/s LVOT Vmean:        81.800 cm/s LVOT VTI:          0.244 m LVOT/AV VTI ratio: 0.73  AORTA Ao Asc diam: 3.40 cm MITRAL VALVE               TRICUSPID VALVE MV Area (PHT): 3.21 cm    TR Peak grad:   35.5 mmHg MV Decel Time: 236 msec    TR Vmax:        298.00 cm/s MV E velocity: 78.60 cm/s MV A velocity: 76.00 cm/s  SHUNTS MV E/A ratio:  1.03        Systemic VTI:  0.24 m                            Systemic Diam: 2.20 cm Jenkins Rouge MD Electronically signed by Jenkins Rouge MD Signature Date/Time: 02/07/2021/11:45:27 AM    Final     Scheduled Meds:  amLODipine  10 mg Oral Daily   ARIPiprazole  10 mg Oral Daily   [START ON 02/09/2021] aspirin EC  81 mg Oral Daily    atorvastatin  20 mg Oral Daily   celecoxib  400 mg Oral Daily   enoxaparin (LOVENOX) injection  40 mg Subcutaneous Q24H   ferrous sulfate  325 mg Oral Daily   fluticasone furoate-vilanterol  1 puff Inhalation Daily   And   umeclidinium bromide  1 puff Inhalation Daily   hydrALAZINE  25 mg Oral BID   lamoTRIgine  200 mg Oral BID   levofloxacin  500 mg Oral Q24H   montelukast  10 mg Oral QHS   pantoprazole  40 mg Oral Daily   potassium chloride  10 mEq Oral Daily   predniSONE  40 mg Oral Q breakfast   sertraline  50 mg Oral Daily   sodium chloride flush  3 mL Intravenous Q12H   tiZANidine  4 mg Oral Q6H   Continuous Infusions:  sodium chloride     sodium chloride 1 mL/kg/hr (02/08/21 0856)     LOS: 2 days    Patrecia Pour, MD [facility] [email or web address] 02/08/2021, 10:35 AM

## 2021-02-08 NOTE — Progress Notes (Signed)
Initial Nutrition Assessment  DOCUMENTATION CODES:   Not applicable  INTERVENTION:  Ensure Enlive TID, between meals, each supplement provides 350 kcals and 20 grams of protein MVI  Low sodium diet education and handout provided   NUTRITION DIAGNOSIS:   Increased nutrient needs related to chronic illness (CHF and COPD) as evidenced by estimated needs.   GOAL:   Patient will meet greater than or equal to 90% of their needs   MONITOR:   PO intake, Supplement acceptance, Weight trends, I & O's  REASON FOR ASSESSMENT:   Consult COPD Protocol  ASSESSMENT:   Pt is a 76 y.o. female with PMH significant for COPD, emphysema on home 02, CAD, CHF, GERD, HTN, left renal mass (s/p partial nephrectomy), who presented to the ED for respiratory distress.  1/23 - s/p LHC with findings of multivessel disease  Met with pt at bedside. Pt had just come back from the cath lab and was lying supine. Pt reports that her appetite has been fair since this admission and states that she has an appetite, but is having a hard time finding something satisfying. Pt reports that for lunch yesterday she had a chef salad that she really enjoyed and stroganoff for dinner yesterday that she did not care too much for. Per pt, she ate one third of the stroganoff.   24-hour recall: Breakfast: cereal Lunch: typically does not have lunch as she eats breakfast at a later time Dinner: Chick fil a, [employer] (okra, chicken liver)   Pt also reports that she gets around with a walker at home.   Per documentation, meal completion of 100% reported for breakfast, lunch and dinner on 1/22.   Reviewed pt's weight encounters and pt's current weight is 183#. Pt has experienced a 8% percent weight increase in the past 3.5 months. Suspect weight fluctuations are related to fluid status given CHF dx. Pt states that she hasn't been very active the last few weeks d/t to health complications.   Discussed with pt the importance of  good nutrition and adequate po intake for post-op cardiac cath and generalized healthful diet. Pt interested in trying Ensure supplement between meals, as pt states this will be an easier way for her to get in some carbohydrates and protein while in the hospital. Discussed with pt vitamin and mineral supplementation, and pt agreeable to taking a MVI with minerals. Discussed with pt ways to decrease sodium in her diet and provided a handout from the Academy of Nutrition and Dietetics, "Low Sodium Nutrition Therapy," for pt. Also discussed tips for eating out at restaurants with the pt.   Medications reviewed and include: protonix, klor-con, ferrous sulfate, prednisone    Labs reviewed.   I/O: -2.0 L since admission   NUTRITION - FOCUSED PHYSICAL EXAM:  Unable to complete at this time. Pt declined.   Diet Order:   Diet Order             Diet Heart Room service appropriate? Yes; Fluid consistency: Thin  Diet effective now                   EDUCATION NEEDS:   Education needs have been addressed  Skin:  Skin Assessment: Reviewed RN Assessment Skin Integrity Issues:: Other (Comment) Other: skin tear right elbow  Last BM:  1/21  Height:   Ht Readings from Last 1 Encounters:  02/06/21 5\' 3"  (1.6 m)    Weight:   Wt Readings from Last 1 Encounters:  02/06/21 83.4 kg  Ideal Body Weight:  52 kg  BMI:  Body mass index is 32.57 kg/m.  Estimated Nutritional Needs:   Kcal:  1800 - 2000  Protein:  100 - 115 grams  Fluid:  > 1.8 L    Maryruth Hancock, Dietetic Intern 02/09/2021 9:37 AM

## 2021-02-08 NOTE — Progress Notes (Incomplete)
Spoke with Anderson Malta from cath lab re: concern for patient's VF order with patient's hx heart failure,was told to start IVF at [facility] which was done.

## 2021-02-08 NOTE — Progress Notes (Signed)
TR band removed. No bleeding or hematoma. Post precautions explained. VSS. Patient verbalized understanding. 2x2 dressing applied.Cindee Salt

## 2021-02-08 NOTE — Interval H&P Note (Signed)
History and Physical Interval Note:  02/08/2021 11:47 AM  [name]  has presented today for surgery, with the diagnosis of NSTEMI.  The various methods of treatment have been discussed with the patient and family. After consideration of risks, benefits and other options for treatment, the patient has consented to  Procedure(s): LEFT HEART CATH AND CORONARY ANGIOGRAPHY (N/A) as a surgical intervention.  The patient's history has been reviewed, patient examined, no change in status, stable for surgery.  I have reviewed the patient's chart and labs.  Questions were answered to the patient's satisfaction.   Cath Lab Visit (complete for each Cath Lab visit)  Clinical Evaluation Leading to the Procedure:   ACS: Yes.    Non-ACS:    Anginal Classification: CCS II  Anti-ischemic medical therapy: Minimal Therapy (1 class of medications)  Non-Invasive Test Results: No non-invasive testing performed  Prior CABG: No previous CABG        Collier Salina [facility] 02/08/2021 11:47 AM

## 2021-02-08 NOTE — Progress Notes (Signed)
°  Transition of Care [facility]) Screening Note   Patient Details  Name: [name] Date of Birth: Oct 11, 1945   [facility]) CM/SW Contact:    Tom-Johnson, Renea Ee, RN Phone Number: 02/08/2021, 4:15 PM  Patient is from home with friend, [name]. Admitted for COPD Exacerbation. Uses home Oxygen on 2L and gets supplies from [place]. Had Cardiac Cath done today. Has a rollator at home. PCP is Harlan Stains, MD and uses [facility] and also uses [facility]. Transition of Care Department [facility]) has reviewed patient and no TOC needs have been identified at this time. We will continue to monitor patient advancement through interdisciplinary progression rounds. If new patient transition needs arise, please place a TOC consult.

## 2021-02-09 ENCOUNTER — Encounter (HOSPITAL_COMMUNITY): Payer: Self-pay | Admitting: Cardiology

## 2021-02-09 DIAGNOSIS — I214 Non-ST elevation (NSTEMI) myocardial infarction: Secondary | ICD-10-CM | POA: Diagnosis not present

## 2021-02-09 DIAGNOSIS — I5032 Chronic diastolic (congestive) heart failure: Secondary | ICD-10-CM

## 2021-02-09 DIAGNOSIS — I2510 Atherosclerotic heart disease of native coronary artery without angina pectoris: Secondary | ICD-10-CM | POA: Diagnosis not present

## 2021-02-09 LAB — LIPID PANEL
Cholesterol: 166 mg/dL (ref 0–200)
HDL: 66 mg/dL (ref >40)
LDL Cholesterol: 81 mg/dL (ref 0–99)
Total CHOL/HDL Ratio: 2.5 RATIO
Triglycerides: 96 mg/dL (ref <150)
VLDL: 19 mg/dL (ref 0–40)

## 2021-02-09 LAB — BASIC METABOLIC PANEL
Anion gap: 7 (ref 5–15)
BUN: 24 mg/dL — ABNORMAL HIGH (ref 8–23)
CO2: 23 mmol/L (ref 22–32)
Calcium: 7.8 mg/dL — ABNORMAL LOW (ref 8.9–10.3)
Chloride: 105 mmol/L (ref 98–111)
Creatinine, Ser: 1.06 mg/dL — ABNORMAL HIGH (ref 0.44–1.00)
GFR, Estimated: 54 mL/min — ABNORMAL LOW (ref >60)
Glucose, Bld: 114 mg/dL — ABNORMAL HIGH (ref 70–99)
Potassium: 3.5 mmol/L (ref 3.5–5.1)
Sodium: 135 mmol/L (ref 135–145)

## 2021-02-09 MED ORDER — ENSURE ENLIVE PO LIQD: 237.0000 mL | Freq: Three times a day (TID) | ORAL | Status: DC

## 2021-02-09 MED ORDER — BISOPROLOL FUMARATE 5 MG PO TABS: 2.5000 mg | ORAL_TABLET | Freq: Every day | ORAL | Status: DC

## 2021-02-09 MED ORDER — ADULT MULTIVITAMIN W/MINERALS CH
1.0000 | ORAL_TABLET | Freq: Every day | ORAL | Status: DC
  Administered 2021-02-09: 1 via ORAL
  Filled 2021-02-09: qty 1

## 2021-02-09 MED ORDER — PREDNISONE 20 MG PO TABS: 40.0000 mg | ORAL_TABLET | Freq: Every day | ORAL | 0 refills | Status: DC

## 2021-02-09 MED ORDER — CLOPIDOGREL BISULFATE 75 MG PO TABS: 75.0000 mg | ORAL_TABLET | Freq: Every day | ORAL | 0 refills | Status: DC

## 2021-02-09 MED ORDER — LEVOFLOXACIN 500 MG PO TABS: 500.0000 mg | ORAL_TABLET | ORAL | 0 refills | Status: DC

## 2021-02-09 MED ORDER — EMPAGLIFLOZIN 10 MG PO TABS: 10.0000 mg | ORAL_TABLET | Freq: Every day | ORAL | Status: DC

## 2021-02-09 MED ORDER — BISOPROLOL FUMARATE 5 MG PO TABS: 2.5000 mg | ORAL_TABLET | Freq: Every day | ORAL | 0 refills | Status: DC

## 2021-02-09 MED ORDER — EMPAGLIFLOZIN 10 MG PO TABS: 10.0000 mg | ORAL_TABLET | Freq: Every day | ORAL | 0 refills | Status: DC

## 2021-02-09 NOTE — Progress Notes (Signed)
Dr. Oval Linsey requested 7-10 day f/u. No availability at [facility]. Trish helped schedule for 1/31 at [facility] location. Tried to call into patient's room to notify her of location but did not answer, so I called the patient's nurse to relay the message and also outlined this information/location difference on AVS as well.

## 2021-02-09 NOTE — Evaluation (Signed)
Physical Therapy Evaluation Patient Details Name: [name] MRN: [id number] DOB: [birth year] Today's Date: 02/09/2021  History of Present Illness  Pt is a 76 y.o. F who was admitted 02/06/2021 with NSTEMI. Significant PMH: CAD s/p RCA PCI, chronic systolic and diastolic heart failure, PAD, COPD on home O2, HTN, RCC s/p partial L nephrectomy.  Clinical Impression  Pt admitted with above. PTA, pt lives with her friend, uses a Radiation protection practitioner and is a Designer, fashion/clothing, and requires assist for some ADL's. Pt in good spirits, denies chest pain at rest or with mobility. Pt appears to be fairly close to her functional baseline. Ambulating 100 feet with a walker without physical assist; SpO2 87-93% on 2L O2, HR 54-84 bpm. Pt with good self awareness and able to self cue for activity pacing. Pt was discharged from [facility] recently and does not feel that she needs those services resumed, however, she would likely benefit from follow up OP cardiac and pulmonary rehab services if she qualifies.     Recommendations for follow up therapy are one component of a multi-disciplinary discharge planning process, led by the attending physician.  Recommendations may be updated based on patient status, additional functional criteria and insurance authorization.  Follow Up Recommendations Other (comment) (would benefit from cardiac & pulmonary rehab)    Assistance Recommended at Discharge PRN  Patient can return home with the following  A little help with walking and/or transfers;A little help with bathing/dressing/bathroom    Equipment Recommendations None recommended by PT  Recommendations for Other Services       Functional Status Assessment Patient has had a recent decline in their functional status and demonstrates the ability to make significant improvements in function in a reasonable and predictable amount of time.     Precautions / Restrictions Precautions Precautions: Fall Restrictions Weight  Bearing Restrictions: No      Mobility  Bed Mobility Overal bed mobility: Needs Assistance Bed Mobility: Supine to Sit     Supine to sit: Supervision          Transfers Overall transfer level: Needs assistance Equipment used: Rolling walker (2 wheels) Transfers: Sit to/from Stand Sit to Stand: Supervision                Ambulation/Gait Ambulation/Gait assistance: Min guard Gait Distance (Feet): 100 Feet Assistive device: Rolling walker (2 wheels) Gait Pattern/deviations: Step-through pattern, Decreased stride length, Knee flexed in stance - right, Trunk flexed Gait velocity: decreased Gait velocity interpretation: <1.8 ft/sec, indicate of risk for recurrent falls   General Gait Details: Slow, but steady pace. Requires one short standing rest break halfway through walk. Right knee flexed in stance which is baseline  Science writer    Modified Rankin (Stroke Patients Only)       Balance Overall balance assessment: Needs assistance Sitting-balance support: Feet supported Sitting balance-Leahy Scale: Good     Standing balance support: Bilateral upper extremity supported Standing balance-Leahy Scale: Poor                               Pertinent Vitals/Pain Pain Assessment Pain Assessment: No/denies pain    Home Living Family/patient expects to be discharged to:: Private residence Living Arrangements: Non-relatives/Friends Available Help at Discharge: Friend(s);Available 24 hours/day Type of Home: House Home Access: Stairs to enter Entrance Stairs-Rails: None Entrance Stairs-Number of Steps: 1-2   Home Layout: One  level Home Equipment: Rollator (4 wheels);Shower seat      Prior Function Prior Level of Function : Needs assist             Mobility Comments: using Rollator ADLs Comments: friend assisting with bathing, dressing, IADL's. able to toilet independently     Hand Dominance   Dominant  Hand: Left    Extremity/Trunk Assessment   Upper Extremity Assessment Upper Extremity Assessment: Defer to OT evaluation    Lower Extremity Assessment Lower Extremity Assessment: Generalized weakness    Cervical / Trunk Assessment Cervical / Trunk Assessment: Kyphotic;Other exceptions (history of scoliosis)  Communication   Communication: No difficulties  Cognition Arousal/Alertness: Awake/alert Behavior During Therapy: WFL for tasks assessed/performed Overall Cognitive Status: Within Functional Limits for tasks assessed                                          General Comments      Exercises     Assessment/Plan    PT Assessment Patient needs continued PT services  PT Problem List Decreased strength;Decreased activity tolerance;Decreased balance;Decreased mobility;Cardiopulmonary status limiting activity       PT Treatment Interventions DME instruction;Gait training;Functional mobility training;Therapeutic activities;Balance training;Therapeutic exercise;Stair training;Patient/family education    PT Goals (Current goals can be found in the Care Plan section)  Acute Rehab PT Goals Patient Stated Goal: be able to dress and bathe herself PT Goal Formulation: With patient Time For Goal Achievement: 02/23/21 Potential to Achieve Goals: Good    Frequency Min 3X/week     Co-evaluation               AM-PAC PT "6 Clicks" Mobility  Outcome Measure Help needed turning from your back to your side while in a flat bed without using bedrails?: None Help needed moving from lying on your back to sitting on the side of a flat bed without using bedrails?: A Little Help needed moving to and from a bed to a chair (including a wheelchair)?: A Little Help needed standing up from a chair using your arms (e.g., wheelchair or bedside chair)?: A Little Help needed to walk in hospital room?: A Little Help needed climbing 3-5 steps with a railing? : A Lot 6 Click  Score: 18    End of Session Equipment Utilized During Treatment: Gait belt;Oxygen Activity Tolerance: Patient tolerated treatment well Patient left: in chair;with call bell/phone within reach;with chair alarm set;with family/visitor present Nurse Communication: Mobility status PT Visit Diagnosis: Other abnormalities of gait and mobility (R26.89);Unsteadiness on feet (R26.81);Muscle weakness (generalized) (M62.81);Difficulty in walking, not elsewhere classified (R26.2)    Time: 6160-7371 PT Time Calculation (min) (ACUTE ONLY): 22 min   Charges:   PT Evaluation $PT Eval Moderate Complexity: 1 Mod          Wyona Almas, PT, DPT Acute Rehabilitation Services Pager [phone] Office [phone]   Deno Etienne 02/09/2021, 12:05 PM

## 2021-02-09 NOTE — TOC Transition Note (Signed)
Transition of Care [facility]) - CM/SW Discharge Note   Patient Details  Name: [name] MRN: [id number] Date of Birth: 1945-09-24  Transition of Care [facility]) CM/SW Contact:  Tom-Johnson, Renea Ee, RN Phone Number: 02/09/2021, 2:14 PM   Clinical Narrative:    Patient is scheduled for discharge today. PT recommends outpatient Cardiac and Pulmonary rehab and no TOC recommendations noted. Family to transport at discharge. NO TOC needs noted.   Final next level of care: Home/Self Care Barriers to Discharge: Barriers Resolved   Patient Goals and CMS Choice        Discharge Placement                       Discharge Plan and Services                                     Social Determinants of Health (SDOH) Interventions     Readmission Risk Interventions No flowsheet data found.

## 2021-02-09 NOTE — Progress Notes (Incomplete)
DISCHARGE NOTE HOME Jorden D Rhem to be discharged Home per MD order. Discussed prescriptions and follow up appointments with the patient. Prescriptions given to patient; medication list explained in detail. Patient verbalized understanding.  Skin clean, dry and intact without evidence of skin break down, no evidence of skin tears noted. IV catheter discontinued intact. Site without signs and symptoms of complications. Dressing and pressure applied. Pt denies pain at the site currently. No complaints noted.  Patient free of lines, drains, and wounds.   An After Visit Summary (AVS) was printed and given to the patient. Patient escorted via wheelchair, and discharged home via private auto.  Berneta Levins, RN

## 2021-02-09 NOTE — Discharge Summary (Addendum)
Physician Discharge Summary  [name] VFI:[id number] DOB: 12-Nov-1945 DOA: 02/06/2021  PCP: Harlan Stains, MD  Admit date: 02/06/2021 Discharge date: 02/09/2021  Admitted From: Home Disposition: Home   Recommendations for Outpatient Follow-up:  Follow up with PCP in 1-2 weeks. Recommend BMP at follow up. Recheck BP and HR. Started bisoprolol 2.5mg  daily (5mg  caused asymptomatic bradycardia), and jardiance. Follow up with cardiology as scheduled. Plan will be to DC norvasc, change losartan to entresto at follow up pending BP trends. Would also consider augmenting statin therapy.  Follow up chest CT in 3 months recommended for pulmonary nodule (see details below)  Home Health: Cardiopulmonary rehab Equipment/Devices: None new Discharge Condition: Stable CODE STATUS: Full Diet recommendation: Heart healthy  Brief/Interim Summary: [name] is a 76 y.o. female with a history of COPD/emphysema on 2L O2, CAD s/p MI 2000 w/PCI, RCC s/p nephrectomy, chronic back pain who presented to the ED by EMS with respiratory distress requiring NRB. She was afebrile with WBC 15.3k, CXR demonstrating bilateral interstitial infiltrates. CT chest showed GGOs and interstitial opacities concerning for pulmonary edema. ECG with sinus rhythm and LAD, troponin 354 > 485 and BNP elevated to 172. Flu and covid negative. She was admitted, given IV lasix, bronchodilators, and hypoxia and respiratory effort have improved. Overnight ECG confirmed new LBBB and cardiac enzymes trended further upward. Echocardiogram confirmed septal apical and inferior basal hypokinesis, LVEF 40-45%. Cardiac catheterization pursued 1/23 revealed 3 vessel obstructive CAD. In light of chronic respiratory failure and frailty, CABG is not pursued, and medical therapy alone was recommended. Plavix added to aspirin and bisoprolol started. The patient's respiratory status has stabilized and improved significantly. She will be discharged to  complete a course of antibiotics and steroids. Cardiology follow up has been arranged to continue medication titration and GDMT.  Discharge Diagnoses:  Principal Problem:   COPD with acute exacerbation ([place]) Active Problems:   Coronary artery disease   NSTEMI (non-ST elevated myocardial infarction) ([place])   HTN (hypertension)   Acute respiratory failure with hypoxia ([facility])   Acute on chronic combined systolic and diastolic CHF (congestive heart failure) (H[facility])   Chronic diastolic congestive heart failure ([facility])  Acute on chronic hypoxic respiratory failure due to COPD exacerbation possibly due to pneumonia: Respiratory effort has stabilized, now down to baseline hypoxia. - PCT 0.25. Continue levaquin 500mg  q24h x 5 days Tx. - Continue steroids, complete burst with prednisone  - Continue regular breathing treatments, singulair   NSTEMI, obstructive multivessel CAD s/p RCA PCI 2000: Medical management is recommended. Multiple severe comorbidities presents prohibitive risk of CABG. Vessels were very tortuous and not amenable to PCI.  - Continue aspirin, added plavix - Started bisoprolol - Continue statin  Sinus bradycardia: Asymptomatic.  - Decrease bisoprolol dose.   Acute HFrEF: LVEF 40-45%, due to ICM. Diastolic parameters were said to be normal. - Diuresed effectively with lasix, appears euvolemic. Will hold further lasix at this time. - Initiation of GDMT per cardiology. Restart home ARB (consider entresto), started bisoprolol, pt is on norvasc which could be discontinued at follow up, though attempting to minimize medication changes acutely.  - Started jardiance   LLL pulmonary nodule: 1.5cm on CT 1/21.  - Follow up CT recommended in 3 months.    Chronic T11 and T12 compression fractures, chronic back/neck pain, torticollis:  - Follow up with Dr. Nelva Bush and PM&R, has an appointment with Dr. Dagoberto Ligas 2/17.   RCC s/p partial L nephrectomy 2014: - Diminished renal reserve noted. CrCl  relatively preserved. Recommend BMP at follow up   GERD:  - PPI   Depression: Quiescent.  - Continue home meds.   Obesity: Estimated body mass index is 32.57 kg/m   Discharge Instructions Discharge Instructions     Diet - low sodium heart healthy   Complete by: As directed    Discharge instructions   Complete by: As directed    You were admitted for pneumonia which led to a COPD exacerbation. This stressed your heart because you have severe coronary artery disease found on the cardiac catheterization. Cardiology has recommended managing the CAD medically, including the following recommendations:  - Continue aspirin and add plavix 75mg  daily. - Start taking bisoprolol 2.5mg  daily. You should measure and record your blood pressure and heart rate at least 2 times daily at home and present that record to your cardiology follow up appointment which has been scheduled prior to your discharge today.  - Start taking jardiance 10mg  daily. - Continue other medications you were taking. In the future, amlodipine is likely to be changed to an alternative agent and atorvastatin dose will likely need to be increased, though this is best addressed at follow up. - For the pneumonia, complete the course of levaquin (antibiotic) by taking a tablet tomorrow and the next day.  - For the COPD exacerbation, continue using inhaled therapies as you were and take prednisone 40mg  starting tomorrow morning and finishing the next day. - If your symptoms return or worsen, seek medical attention right away.   Increase activity slowly   Complete by: As directed    No wound care   Complete by: As directed    No wound care   Complete by: As directed       Allergies as of 02/09/2021       Reactions   Cephalexin    Other reaction(s): diarrhea   Doxycycline Diarrhea   Gabapentin Other (See Comments)   sleepiness   Morphine And Related Anxiety   Was confused when taking it        Medication List      STOP taking these medications    amLODipine 5 MG tablet Commonly known as: NORVASC   atorvastatin 20 MG tablet Commonly known as: LIPITOR   linaclotide 145 MCG Caps capsule Commonly known as: LINZESS   tamsulosin 0.4 MG Caps capsule Commonly known as: FLOMAX       TAKE these medications    acetaminophen 325 MG tablet Commonly known as: TYLENOL Take 2 tablets (650 mg total) by mouth every 4 (four) hours as needed for mild pain ((score 1 to 3) or temp > 100.5).   albuterol 108 (90 Base) MCG/ACT inhaler Commonly known as: VENTOLIN HFA Inhale 2 puffs into the lungs every 6 (six) hours as needed for wheezing.   albuterol (2.5 MG/3ML) 0.083% nebulizer solution Commonly known as: PROVENTIL Take 3 mLs (2.5 mg total) by nebulization every 6 (six) hours as needed for wheezing or shortness of breath.   amlodipine-atorvastatin 10-20 MG tablet Commonly known as: CADUET Take 1 tablet by mouth daily.   ARIPiprazole 10 MG tablet Commonly known as: ABILIFY Take 1 tablet (10 mg total) by mouth daily.   aspirin EC 81 MG tablet Take 81 mg by mouth every morning.   bisoprolol 5 MG tablet Commonly known as: ZEBETA Take 0.5 tablets (2.5 mg total) by mouth daily. Start taking on: February 10, 2021   Breztri Aerosphere 160-9-4.8 MCG/ACT Aero Generic drug: Budeson-Glycopyrrol-Formoterol Inhale 2 puffs into the lungs  in the morning and at bedtime.   Calcium Carbonate-Vitamin D3 600-400 MG-UNIT Tabs Take 1 tablet by mouth daily.   celecoxib 200 MG capsule Commonly known as: CELEBREX Take 400 mg by mouth daily.   clopidogrel 75 MG tablet Commonly known as: PLAVIX Take 1 tablet (75 mg total) by mouth daily. Start taking on: February 10, 2021   dexlansoprazole 60 MG capsule Commonly known as: DEXILANT Take 1 capsule (60 mg total) by mouth daily.   docusate sodium 100 MG capsule Commonly known as: COLACE Take 100 mg by mouth daily as needed for mild constipation.    empagliflozin 10 MG Tabs tablet Commonly known as: JARDIANCE Take 1 tablet (10 mg total) by mouth daily. Start taking on: February 10, 2021   FeroSul 325 (65 FE) MG tablet Generic drug: ferrous sulfate Take 1 tablet (325 mg total) by mouth daily.   Fish Oil 1000 MG Caps Take 1,000 mg by mouth 2 (two) times daily.   furosemide 20 MG tablet Commonly known as: LASIX Take 1 tablet (20 mg total) by mouth daily.   hydrALAZINE 25 MG tablet Commonly known as: APRESOLINE Take 1 tablet (25 mg total) by mouth 2 (two) times daily.   lamoTRIgine 100 MG tablet Commonly known as: LAMICTAL Take 2 tablets (200 mg total) by mouth 2 (two) times daily.   levofloxacin 500 MG tablet Commonly known as: LEVAQUIN Take 1 tablet (500 mg total) by mouth daily. Start taking on: February 10, 2021   montelukast 10 MG tablet Commonly known as: SINGULAIR Take 1 tablet (10 mg total) by mouth at bedtime. TAKE 1 TABLET BY MOUTH EVERYDAY AT BEDTIME What changed: additional instructions   multivitamin capsule Take 1 capsule by mouth daily.   nitroGLYCERIN 0.4 MG SL tablet Commonly known as: NITROSTAT Place 1 tablet (0.4 mg total) under the tongue every 5 (five) minutes as needed for chest pain.   Oxycodone HCl 10 MG Tabs Take 1 tablet (10 mg total) by mouth every 4 (four) hours as needed for severe pain. What changed: Another medication with the same name was removed. Continue taking this medication, and follow the directions you see here.   potassium chloride 8 MEQ tablet Commonly known as: KLOR-CON Take 1 tablet (8 mEq total) by mouth daily.   predniSONE 20 MG tablet Commonly known as: DELTASONE Take 2 tablets (40 mg total) by mouth daily with breakfast. Start taking on: February 10, 2021   Prolia 60 MG/ML Sosy injection Generic drug: denosumab Inject 60 mg into the skin every 6 (six) months.   sertraline 50 MG tablet Commonly known as: ZOLOFT Take 1 tablet (50 mg total) by mouth daily.    Systane 0.4-0.3 % Gel ophthalmic gel Generic drug: Polyethyl Glycol-Propyl Glycol Place 1 application into both eyes daily as needed (dry eyes).   tiZANidine 4 MG tablet Commonly known as: ZANAFLEX Take 1 tablet (4 mg total) by mouth every 6 (six) hours.   valsartan 160 MG tablet Commonly known as: DIOVAN Take 2 tablets (320 mg total) by mouth daily.   vitamin C 1000 MG tablet Take 1,000 mg by mouth daily.   Vitamin D 50 MCG (2000 UT) tablet Take 2,000 Units by mouth daily.        Follow-up Information     Harlan Stains, MD Follow up.   Specialty: Family Medicine Contact information: [address], [place] [address] [phone]         Loel Dubonnet, NP Follow up.  Specialty: Cardiology Why: CARDIOLOGY FOLLOW-UP: We have arranged for your cardiology follow-up visit to take place in our [place] location on Tuesday Feb 16, 2021 at 8:25 AM (Arrive by 8:10 AM). Urban Gibson is one of our nurse practitioners that works with Dr. Martinique. We did not have any availability in the [place] location so you will see Caitlin at [facility] for your close follow-up visit. Contact information: [address] [place] [address] [phone]                Allergies  Allergen Reactions   Cephalexin     Other reaction(s): diarrhea   Doxycycline Diarrhea   Gabapentin Other (See Comments)    sleepiness   Morphine And Related Anxiety    Was confused when taking it    Consultations: Cardiology  Procedures/Studies: CT Chest W Contrast  Result Date: 02/06/2021 CLINICAL DATA:  76 year old female with shortness of breath today. EXAM: CT CHEST WITH CONTRAST TECHNIQUE: Multidetector CT imaging of the chest was performed during intravenous contrast administration. RADIATION DOSE REDUCTION: This exam was performed according to the departmental dose-optimization program which includes automated exposure control, adjustment of the mA and/or  kV according to patient size and/or use of iterative reconstruction technique. CONTRAST:  16mL OMNIPAQUE IOHEXOL 300 MG/ML  SOLN COMPARISON:  03/02/2020 and prior chest CTs. 02/06/2021 and prior chest radiographs. 08/25/2020 thoracic spine MR FINDINGS: Cardiovascular: Cardiomegaly again identified. Heavy coronary artery and aortic atherosclerotic calcifications are present. No thoracic aortic aneurysm or pericardial effusion identified. Mediastinum/Nodes: No enlarged mediastinal, hilar, or axillary lymph nodes. Thyroid gland, trachea, and esophagus demonstrate no significant findings. Lungs/Pleura: Moderate to severe emphysema again identified. Mild ground-glass opacities and interstitial opacities within both lungs are not significantly changed, nonspecific. A 1.5 cm nodular opacity is identified within the LEFT LOWER lobe (series 4: Image 17) but there is increasing LEFT basilar atelectasis in this region since the prior study as well. There is no evidence of pulmonary mass, pneumothorax or pleural effusion. Upper Abdomen: No acute abnormality. Musculoskeletal: No acute abnormalities. T11 and T12 compression fractures do not appear significantly changed from 08/25/2020 MR IMPRESSION: 1. 1.5 cm nodular opacity within the LEFT LOWER lobe with increasing LEFT basilar atelectasis in this region since the prior study. This probably represents an area of atelectasis but short-term chest CT follow-up is recommended (3 months). 2. Unchanged mild ground-glass opacities and interstitial opacities within both lungs, nonspecific but may represent edema. 3. Cardiomegaly and coronary artery disease. 4. Unchanged T11 and T12 compression fractures. 5. Aortic Atherosclerosis (ICD10-I70.0) and Emphysema (ICD10-J43.9). Electronically Signed   By: Margarette Canada M.D.   On: 02/06/2021 12:53   CT CERVICAL SPINE WO CONTRAST  Result Date: 01/26/2021 CLINICAL DATA:  Cervalgia. EXAM: CT CERVICAL SPINE WITHOUT CONTRAST TECHNIQUE:  Multidetector CT imaging of the cervical spine was performed without intravenous contrast. Multiplanar CT image reconstructions were also generated. RADIATION DOSE REDUCTION: This exam was performed according to the departmental dose-optimization program which includes automated exposure control, adjustment of the mA and/or kV according to patient size and/or use of iterative reconstruction technique. COMPARISON:  Cervical spine MRI 08/25/2020. CT chest 03/02/2020. FINDINGS: Alignment: Again seen is reversal of normal cervical lordosis. There is stable 2 mm of anterolisthesis at C3-C4 and C4-C5. Spinal alignment is otherwise within normal limits. Skull base and vertebrae: There is no evidence for acute fracture. Posterior fusion hardware is again seen at L2 and L4. No evidence for hardware loosening. Soft tissues and spinal canal: No prevertebral fluid or swelling.  No visible canal hematoma. Disc levels: There is stable mild to moderate intervertebral disc space narrowing throughout the cervical spine. Endplate osteophytes are seen at C4-C5, C5-C6, C6-C7 and C7-T1. Facet arthropathy is noted bilaterally at C2-C4. Bilateral neural foraminal stenosis at C3-C4 and C4-C5, left greater than right. No significant central canal stenosis identified. Upper chest: Negative. Other: None. IMPRESSION: 1. No acute bony abnormality. 2. Stable degenerative changes and mild anterolisthesis at C3-C4 and C4-C5. 3. Stable reversal of normal cervical lordosis. 4. Uncomplicated posterior fusion changes at C2-C4. Electronically Signed   By: Ronney Asters M.D.   On: 01/26/2021 20:50   CARDIAC CATHETERIZATION  Result Date: 02/08/2021   Prox LAD lesion is 80% stenosed.   Mid LAD lesion is 70% stenosed.   2nd Mrg lesion is 70% stenosed.   Ost RCA to Prox RCA lesion is 20% stenosed.   Prox RCA to Mid RCA lesion is 60% stenosed.   Dist RCA lesion is 85% stenosed.   LV end diastolic pressure is moderately elevated. 3 vessel obstructive CAD.  Patient's arteries are severely calcified and tortuous. There is a 80% stenosis in the LAD just proximal to the first septal perforator and a segemental 70% lesion in the mid vessel. There is a 70% stenosis of the second OM. The stent in the proximal RCA is patent but there is a 60% stenosis in the mid vessel and an 85% eccentric calcified nodular lesion in the distal RCA Moderately elevated LVEDP 29 mm Hg. Plan: patient is not a candidate for CABG given age and multiple co-morbidities including significant debility. PCI would be very difficult given severe calcification and tortuosity of vessels. I would recommend aggressive medical therapy especially since she denies any angina. Will add bisoprolol 5 mg daily and Plavix 75 mg daily for ACS indication.   DG Chest Portable 1 View  Result Date: 02/06/2021 CLINICAL DATA:  Shortness of breath. EXAM: PORTABLE CHEST 1 VIEW COMPARISON:  Chest radiograph 08/25/2020. FINDINGS: Monitoring leads overlie the patient. Stable cardiomegaly. Tortuosity and calcification of the thoracic aorta. Emphysematous change. Interval development of diffuse bilateral interstitial opacities. No pleural effusion or pneumothorax. IMPRESSION: Diffuse bilateral interstitial opacities may represent multifocal infection or pulmonary edema. Electronically Signed   By: Lovey Newcomer M.D.   On: 02/06/2021 09:56   ECHOCARDIOGRAM COMPLETE  Result Date: 02/07/2021    ECHOCARDIOGRAM REPORT   Patient Name:   JONAI WEYLAND [facility] Date of Exam: 02/07/2021 Medical Rec #:  [id number]        Height:       63.0 in Accession #:    [id number]       Weight:       183.9 lb Date of Birth:  [birth year]        BSA:          1.866 m Patient Age:    76 years         BP:           123/67 mmHg Patient Gender: F                HR:           69 bpm. Exam Location:  Inpatient Procedure: 2D Echo, Cardiac Doppler and Color Doppler STAT ECHO Indications:    Chest Pain R07.9                 Elevated Troponin  History:         Patient has prior history of Echocardiogram examinations.  Previous Myocardial Infarction, Acute MI and CAD; Risk                 Factors:Hypertension. GERD. Alcoholism.  Sonographer:    Merrie Roof RDCS Referring Phys: [place]  1. Septal apical and inferior basal hypokinesis . Left ventricular ejection fraction, by estimation, is 40 to 45%. The left ventricle has mildly decreased function. The left ventricle demonstrates regional wall motion abnormalities (see scoring diagram/findings for description). The left ventricular internal cavity size was mildly dilated. There is mild left ventricular hypertrophy. Left ventricular diastolic parameters were normal.  2. Right ventricular systolic function is normal. The right ventricular size is normal. There is mildly elevated pulmonary artery systolic pressure.  3. Left atrial size was moderately dilated.  4. The mitral valve is abnormal. Mild mitral valve regurgitation. No evidence of mitral stenosis. Moderate mitral annular calcification.  5. Calcified non coronary cusp. The aortic valve is tricuspid. There is mild calcification of the aortic valve. There is mild thickening of the aortic valve. Aortic valve regurgitation is not visualized. Aortic valve sclerosis is present, with no evidence of aortic valve stenosis.  6. The inferior vena cava is normal in size with greater than 50% respiratory variability, suggesting right atrial pressure of 3 mmHg. FINDINGS  Left Ventricle: Septal apical and inferior basal hypokinesis. Left ventricular ejection fraction, by estimation, is 40 to 45%. The left ventricle has mildly decreased function. The left ventricle demonstrates regional wall motion abnormalities. The left  ventricular internal cavity size was mildly dilated. There is mild left ventricular hypertrophy. Left ventricular diastolic parameters were normal. Right Ventricle: The right ventricular size is normal. No increase in right  ventricular wall thickness. Right ventricular systolic function is normal. There is mildly elevated pulmonary artery systolic pressure. The tricuspid regurgitant velocity is 2.98  m/s, and with an assumed right atrial pressure of 3 mmHg, the estimated right ventricular systolic pressure is 89.3 mmHg. Left Atrium: Left atrial size was moderately dilated. Right Atrium: Right atrial size was normal in size. Pericardium: There is no evidence of pericardial effusion. Mitral Valve: The mitral valve is abnormal. There is mild thickening of the mitral valve leaflet(s). There is mild calcification of the mitral valve leaflet(s). Moderate mitral annular calcification. Mild mitral valve regurgitation. No evidence of mitral  valve stenosis. Tricuspid Valve: The tricuspid valve is normal in structure. Tricuspid valve regurgitation is mild . No evidence of tricuspid stenosis. Aortic Valve: Calcified non coronary cusp. The aortic valve is tricuspid. There is mild calcification of the aortic valve. There is mild thickening of the aortic valve. Aortic valve regurgitation is not visualized. Aortic valve sclerosis is present, with  no evidence of aortic valve stenosis. Aortic valve mean gradient measures 6.0 mmHg. Aortic valve peak gradient measures 11.2 mmHg. Aortic valve area, by VTI measures 2.79 cm. Pulmonic Valve: The pulmonic valve was normal in structure. Pulmonic valve regurgitation is not visualized. No evidence of pulmonic stenosis. Aorta: The aortic root is normal in size and structure. Venous: The inferior vena cava is normal in size with greater than 50% respiratory variability, suggesting right atrial pressure of 3 mmHg. IAS/Shunts: No atrial level shunt detected by color flow Doppler.  LEFT VENTRICLE PLAX 2D LVIDd:         5.00 cm   Diastology LVIDs:         3.60 cm   LV e' medial:    5.98 cm/s LV PW:         1.20  cm   LV E/e' medial:  13.1 LV IVS:        1.30 cm   LV e' lateral:   9.25 cm/s LVOT diam:     2.20 cm   LV  E/e' lateral: 8.5 LV SV:         93 LV SV Index:   50 LVOT Area:     3.80 cm  RIGHT VENTRICLE          IVC RV Basal diam:  3.70 cm  IVC diam: 2.30 cm LEFT ATRIUM           Index        RIGHT ATRIUM           Index LA diam:      4.40 cm 2.36 cm/m   RA Area:     20.70 cm LA Vol (A2C): 64.5 ml 34.57 ml/m  RA Volume:   60.80 ml  32.59 ml/m LA Vol (A4C): 68.7 ml 36.82 ml/m  AORTIC VALVE AV Area (Vmax):    2.87 cm AV Area (Vmean):   2.78 cm AV Area (VTI):     2.79 cm AV Vmax:           167.00 cm/s AV Vmean:          112.000 cm/s AV VTI:            0.333 m AV Peak Grad:      11.2 mmHg AV Mean Grad:      6.0 mmHg LVOT Vmax:         126.00 cm/s LVOT Vmean:        81.800 cm/s LVOT VTI:          0.244 m LVOT/AV VTI ratio: 0.73  AORTA Ao Asc diam: 3.40 cm MITRAL VALVE               TRICUSPID VALVE MV Area (PHT): 3.21 cm    TR Peak grad:   35.5 mmHg MV Decel Time: 236 msec    TR Vmax:        298.00 cm/s MV E velocity: 78.60 cm/s MV A velocity: 76.00 cm/s  SHUNTS MV E/A ratio:  1.03        Systemic VTI:  0.24 m                            Systemic Diam: 2.20 cm Jenkins Rouge MD Electronically signed by Jenkins Rouge MD Signature Date/Time: 02/07/2021/11:45:27 AM    Final     Subjective: Wants to go home today. Has more assistance and is breathing as well as she normally does. No chest pain.   Discharge Exam: Vitals:   02/09/21 0859 02/09/21 0902  BP: 121/70 121/70  Pulse:  (!) 51  Resp:  18  Temp:  97.6 F (36.4 C)  SpO2:  97%   General: Pt is alert, awake, not in acute distress, significant torticollis. Cardiovascular: Regular bradycardia, S1/S2 +, no rubs, no gallops Respiratory: Nonlabored, clear without wheezes. Abdominal: Soft, NT, ND, bowel sounds + Extremities: No edema, no cyanosis  Labs: BNP (last 3 results) Recent Labs    05/28/20 1924 02/06/21 0905 02/07/21 0416  BNP 222.4* 172.0* 1,660.6*   Basic Metabolic Panel: Recent Labs  Lab 02/06/21 0905 02/07/21 0416 02/08/21 0244  02/09/21 0438  NA 138 132* 130* 135  K 4.5 4.3 4.0 3.5  CL 106 98 102 105  CO2 22 23 22 23   GLUCOSE  148* 156* 131* 114*  BUN 14 17 30* 24*  CREATININE 0.96 0.84 0.97 1.06*  CALCIUM 8.9 8.3* 7.6* 7.8*   Liver Function Tests: Recent Labs  Lab 02/06/21 0905  AST 24  ALT 25  ALKPHOS 111  BILITOT 0.5  PROT 6.3*  ALBUMIN 3.3*   No results for input(s): LIPASE, AMYLASE in the last 168 hours. No results for input(s): AMMONIA in the last 168 hours. CBC: Recent Labs  Lab 02/06/21 0905 02/08/21 1604  WBC 15.3* 11.6*  HGB 14.7 13.3  HCT 41.8 36.9  MCV 98.8 95.8  PLT 336 276   Cardiac Enzymes: No results for input(s): CKTOTAL, CKMB, CKMBINDEX, TROPONINI in the last 168 hours. BNP: Invalid input(s): POCBNP CBG: No results for input(s): GLUCAP in the last 168 hours. D-Dimer No results for input(s): DDIMER in the last 72 hours. Hgb A1c No results for input(s): HGBA1C in the last 72 hours. Lipid Profile No results for input(s): CHOL, HDL, LDLCALC, TRIG, CHOLHDL, LDLDIRECT in the last 72 hours. Thyroid function studies No results for input(s): TSH, T4TOTAL, T3FREE, THYROIDAB in the last 72 hours.  Invalid input(s): FREET3 Anemia work up No results for input(s): VITAMINB12, FOLATE, FERRITIN, TIBC, IRON, RETICCTPCT in the last 72 hours. Urinalysis    Component Value Date/Time   COLORURINE YELLOW 08/25/2020 2058   APPEARANCEUR CLEAR 08/25/2020 2058   LABSPEC 1.008 08/25/2020 2058   PHURINE 6.0 08/25/2020 2058   GLUCOSEU NEGATIVE 08/25/2020 2058   HGBUR NEGATIVE 08/25/2020 2058   [place] 08/25/2020 2058   [place] NEGATIVE 08/25/2020 2058   PROTEINUR NEGATIVE 08/25/2020 2058   UROBILINOGEN 0.2 03/07/2012 1234   NITRITE NEGATIVE 08/25/2020 2058   LEUKOCYTESUR TRACE (A) 08/25/2020 2058    Microbiology Recent Results (from the past 240 hour(s))  Resp Panel by RT-PCR (Flu A&B, Covid) Nasopharyngeal Swab     Status: None   Collection Time: 02/06/21  9:55 AM    Specimen: Nasopharyngeal Swab; Nasopharyngeal(NP) swabs in vial transport medium  Result Value Ref Range Status   SARS Coronavirus 2 by RT PCR NEGATIVE NEGATIVE Final    Comment: (NOTE) SARS-CoV-2 target nucleic acids are NOT DETECTED.  The SARS-CoV-2 RNA is generally detectable in upper respiratory specimens during the acute phase of infection. The lowest concentration of SARS-CoV-2 viral copies this assay can detect is 138 copies/mL. A negative result does not preclude SARS-Cov-2 infection and should not be used as the sole basis for treatment or other patient management decisions. A negative result may occur with  improper specimen collection/handling, submission of specimen other than nasopharyngeal swab, presence of viral mutation(s) within the areas targeted by this assay, and inadequate number of viral copies(<138 copies/mL). A negative result must be combined with clinical observations, patient history, and epidemiological information. The expected result is Negative.  Fact Sheet for Patients:  EntrepreneurPulse.com.au  Fact Sheet for Healthcare Providers:  IncredibleEmployment.be  This test is no t yet approved or cleared by the [place] FDA and  has been authorized for detection and/or diagnosis of SARS-CoV-2 by FDA under an Emergency Use Authorization (EUA). This EUA will remain  in effect (meaning this test can be used) for the duration of the COVID-19 declaration under Section 564(b)(1) of the Act, 21 U.S.C.section 360bbb-3(b)(1), unless the authorization is terminated  or revoked sooner.       Influenza A by PCR NEGATIVE NEGATIVE Final   Influenza B by PCR NEGATIVE NEGATIVE Final    Comment: (NOTE) The Xpert Xpress SARS-CoV-2/FLU/RSV plus assay is intended  as an aid in the diagnosis of influenza from Nasopharyngeal swab specimens and should not be used as a sole basis for treatment. Nasal washings and aspirates are  unacceptable for Xpert Xpress SARS-CoV-2/FLU/RSV testing.  Fact Sheet for Patients: EntrepreneurPulse.com.au  Fact Sheet for Healthcare Providers: IncredibleEmployment.be  This test is not yet approved or cleared by the [place] FDA and has been authorized for detection and/or diagnosis of SARS-CoV-2 by FDA under an Emergency Use Authorization (EUA). This EUA will remain in effect (meaning this test can be used) for the duration of the COVID-19 declaration under Section 564(b)(1) of the Act, 21 U.S.C. section 360bbb-3(b)(1), unless the authorization is terminated or revoked.  Performed at [facility], [place] [address] [place], [place] [address]     Time coordinating discharge: Approximately 40 minutes  Patrecia Pour, MD  [facility] 02/09/2021, 1:06 PM

## 2021-02-09 NOTE — Care Management Important Message (Signed)
Important Message  Patient Details  Name: [name] MRN: [id number] Date of Birth: 1946-01-16   Medicare Important Message Given:  Yes     Loraine Bhullar 02/09/2021, 2:20 PM

## 2021-02-09 NOTE — Progress Notes (Signed)
Progress Note  Patient Name: [name] Date of Encounter: 02/09/2021  [facility] Cardiologist: Peter Martinique, MD   Subjective   Feeling well.  Eager to go home.  No chest pain with ambulation.  She has her stable, chronic exertional dyspnea.  Denies LH/dizziness.   Inpatient Medications    Scheduled Meds:  amLODipine  10 mg Oral Daily   ARIPiprazole  10 mg Oral Daily   aspirin EC  81 mg Oral Daily   atorvastatin  20 mg Oral Daily   [START ON 02/10/2021] bisoprolol  2.5 mg Oral Daily   celecoxib  400 mg Oral Daily   clopidogrel  75 mg Oral Daily   enoxaparin (LOVENOX) injection  40 mg Subcutaneous Q24H   feeding supplement  237 mL Oral TID BM   ferrous sulfate  325 mg Oral Daily   fluticasone furoate-vilanterol  1 puff Inhalation Daily   And   umeclidinium bromide  1 puff Inhalation Daily   hydrALAZINE  25 mg Oral BID   lamoTRIgine  200 mg Oral BID   levofloxacin  500 mg Oral Q24H   montelukast  10 mg Oral QHS   multivitamin with minerals  1 tablet Oral Daily   pantoprazole  40 mg Oral Daily   potassium chloride  10 mEq Oral Daily   predniSONE  40 mg Oral Q breakfast   sertraline  50 mg Oral Daily   sodium chloride flush  3 mL Intravenous Q12H   tiZANidine  4 mg Oral Q6H   Continuous Infusions:  sodium chloride     PRN Meds: sodium chloride, acetaminophen, albuterol, docusate sodium, nitroGLYCERIN, ondansetron (ZOFRAN) IV, oxyCODONE, polyvinyl alcohol, sodium chloride flush   Vital Signs    Vitals:   02/09/21 0430 02/09/21 0815 02/09/21 0859 02/09/21 0902  BP: 106/80  121/70 121/70  Pulse: (!) 59 60  (!) 51  Resp: 18 16  18   Temp: 97.8 F (36.6 C)   97.6 F (36.4 C)  TempSrc: Oral   Oral  SpO2: 94% 95%  97%  Weight:      Height:        Intake/Output Summary (Last 24 hours) at 02/09/2021 1147 Last data filed at 02/09/2021 1145 Gross per 24 hour  Intake 456.6 ml  Output 1475 ml  Net -1018.4 ml   Last 3 Weights 02/06/2021 02/06/2021 01/29/2021   Weight (lbs) 183 lb 13.8 oz 186 lb 4.6 oz 186 lb 3.2 oz  Weight (kg) 83.4 kg 84.5 kg 84.46 kg      Telemetry    Sinus bradycardia.  Rate 40-60s. - Personally Reviewed  ECG    N/a - Personally Reviewed  Physical Exam   GEN: Frail.  No acute distress.   Neck: No JVD Cardiac: RRR, no murmurs, rubs, or gallops.  Respiratory: Clear to auscultation bilaterally. GI: Soft, nontender, non-distended  MS: No edema; No deformity. Neuro:  Nonfocal  Psych: Normal affect   Labs    High Sensitivity Troponin:   Recent Labs  Lab 02/06/21 1155 02/06/21 1336 02/07/21 0416  TROPONINIHS 354* 485* 1,233*     Chemistry Recent Labs  Lab 02/06/21 0905 02/07/21 0416 02/08/21 0244 02/09/21 0438  NA 138 132* 130* 135  K 4.5 4.3 4.0 3.5  CL 106 98 102 105  CO2 22 23 22 23   GLUCOSE 148* 156* 131* 114*  BUN 14 17 30* 24*  CREATININE 0.96 0.84 0.97 1.06*  CALCIUM 8.9 8.3* 7.6* 7.8*  PROT 6.3*  --   --   --  ALBUMIN 3.3*  --   --   --   AST 24  --   --   --   ALT 25  --   --   --   ALKPHOS 111  --   --   --   BILITOT 0.5  --   --   --   GFRNONAA >60 >60 >60 54*  ANIONGAP 10 11 6 7     Lipids No results for input(s): CHOL, TRIG, HDL, LABVLDL, LDLCALC, CHOLHDL in the last 168 hours.  Hematology Recent Labs  Lab 02/06/21 0905 02/08/21 1604  WBC 15.3* 11.6*  RBC 4.23 3.85*  HGB 14.7 13.3  HCT 41.8 36.9  MCV 98.8 95.8  MCH 34.8* 34.5*  MCHC 35.2 36.0  RDW 14.0 13.5  PLT 336 276   Thyroid No results for input(s): TSH, FREET4 in the last 168 hours.  BNP Recent Labs  Lab 02/06/21 0905 02/07/21 0416  BNP 172.0* 1,642.5*    DDimer No results for input(s): DDIMER in the last 168 hours.   Radiology    CARDIAC CATHETERIZATION  Result Date: 02/08/2021   Prox LAD lesion is 80% stenosed.   Mid LAD lesion is 70% stenosed.   2nd Mrg lesion is 70% stenosed.   Ost RCA to Prox RCA lesion is 20% stenosed.   Prox RCA to Mid RCA lesion is 60% stenosed.   Dist RCA lesion is 85%  stenosed.   LV end diastolic pressure is moderately elevated. 3 vessel obstructive CAD. Patient's arteries are severely calcified and tortuous. There is a 80% stenosis in the LAD just proximal to the first septal perforator and a segemental 70% lesion in the mid vessel. There is a 70% stenosis of the second OM. The stent in the proximal RCA is patent but there is a 60% stenosis in the mid vessel and an 85% eccentric calcified nodular lesion in the distal RCA Moderately elevated LVEDP 29 mm Hg. Plan: patient is not a candidate for CABG given age and multiple co-morbidities including significant debility. PCI would be very difficult given severe calcification and tortuosity of vessels. I would recommend aggressive medical therapy especially since she denies any angina. Will add bisoprolol 5 mg daily and Plavix 75 mg daily for ACS indication.    Cardiac Studies   Echo 02/07/21: 1. Septal apical and inferior basal hypokinesis . Left ventricular  ejection fraction, by estimation, is 40 to 45%. The left ventricle has  mildly decreased function. The left ventricle demonstrates regional wall  motion abnormalities (see scoring  diagram/findings for description). The left ventricular internal cavity  size was mildly dilated. There is mild left ventricular hypertrophy. Left  ventricular diastolic parameters were normal.   2. Right ventricular systolic function is normal. The right ventricular  size is normal. There is mildly elevated pulmonary artery systolic  pressure.   3. Left atrial size was moderately dilated.   4. The mitral valve is abnormal. Mild mitral valve regurgitation. No  evidence of mitral stenosis. Moderate mitral annular calcification.   5. Calcified non coronary cusp. The aortic valve is tricuspid. There is  mild calcification of the aortic valve. There is mild thickening of the  aortic valve. Aortic valve regurgitation is not visualized. Aortic valve  sclerosis is present, with no   evidence of aortic valve stenosis.   6. The inferior vena cava is normal in size with greater than 50%  respiratory variability, suggesting right atrial pressure of 3 mmHg.   LHC 02/06/21:  Prox LAD lesion is 80% stenosed.   Mid LAD lesion is 70% stenosed.   2nd Mrg lesion is 70% stenosed.   Ost RCA to Prox RCA lesion is 20% stenosed.   Prox RCA to Mid RCA lesion is 60% stenosed.   Dist RCA lesion is 85% stenosed.   LV end diastolic pressure is moderately elevated.   3 vessel obstructive CAD. Patient's arteries are severely calcified and tortuous. There is a 80% stenosis in the LAD just proximal to the first septal perforator and a segemental 70% lesion in the mid vessel. There is a 70% stenosis of the second OM. The stent in the proximal RCA is patent but there is a 60% stenosis in the mid vessel and an 85% eccentric calcified nodular lesion in the distal RCA Moderately elevated LVEDP 29 mm Hg.  Patient Profile     Ms. Wolf is a 41F with CAD s/p RCA PCI (5170), chronic systolic and diastolic heart failure, PAD, COPD on home O2, hypertension, hyperlipidemia, RCC s/p partial L nephrectomy admitted with NSTEMI.  Assessment & Plan    # NSTEMI:  # Obstructive CAD:  # s/p RCA PCI:  # Hyperlipidemia:  Ms. Terie Purser presented with shortness of breath and was found to have an NSTEMI.  She has multivessel disease on cath.  Ideally she would get a CABG.  However she is not a candidate due to underlying medical comorbidities, especially her COPD on home O2 and frailty.  Her vessels were very tortuous and not amenable to PCI.  We will medically manage.  She was started on clopidogrel and will continue on aspirin.  We have added bisoprolol 2.5 mg daily.  She is bradycardic, though asymptomatic, on 5 mg.  She will track her blood pressures and heart rate and bring to follow-up.  Lipids are well controlled on atorvastatin.  Her LDL was 51 when checked 02/2020.  We will add on the fasting lipid  panel.  # Hypertension: Blood pressures well controlled on amlodipine and bisoprolol.  Her blood pressure is a little on the low side.  She is going to track her blood pressures and bring to follow-up.  Ideally would stop amlodipine and get her on Entresto or an ARB.  SGLT2 inhibitor. She is eager to go today so we will not make so many changes at once.  # Acute systolic and diastolic heart failure:  LVEF reduced to 40 to 45%.  She previously had an ischemic and myopathy that improved with PCI and guideline directed medical therapy.  She is currently euvolemic and feeling well.  She was started on bisoprolol but developed bradycardia.  We will reduce the dose to 5 mg and have her track her blood pressures and heart rates at home.  Add Jardiance 10mg  daily.   [facility] will sign off.   Medication Recommendations:  reduce bisoprolol.  Add Jardiance. Other recommendations (labs, testing, etc):  track BP and HR.  Consider switching amlodipine to ARB/Entresto as an outpatient. Follow up as an outpatient:  we have arranged.   For questions or updates, please contact [place] Please consult [email or web address] for contact info under        Signed, Skeet Latch, MD  02/09/2021, 11:47 AM

## 2021-02-10 ENCOUNTER — Other Ambulatory Visit: Payer: Self-pay

## 2021-02-10 MED ORDER — EMPAGLIFLOZIN 10 MG PO TABS: 10.0000 mg | ORAL_TABLET | Freq: Every day | ORAL | 3 refills | Status: DC

## 2021-02-12 ENCOUNTER — Telehealth: Payer: Self-pay | Admitting: Cardiology

## 2021-02-12 NOTE — Telephone Encounter (Signed)
Called pt to let her know she can take an extra dose of Lasix today due to the weight gain. Will get this message over to Dr. Martinique for advise.

## 2021-02-12 NOTE — Telephone Encounter (Signed)
Yes, agree  Raelin Pixler Martinique MD, [facility]

## 2021-02-12 NOTE — Telephone Encounter (Signed)
Called pt to make her aware Dr. Martinique is agreeable with plan. She was encouraged to monitor her weight as well as swelling. She verbalized understanding and will reach out Monday if she has an issues over the weekend.

## 2021-02-12 NOTE — Telephone Encounter (Signed)
Patient called stating she has gained 3lbs in one day.  She doesn't have swelling anywhere. She wants to know if she can take an extra fluid pill and an extra potassium pill today.  She is also wondering if one of the new medication she taking could cause her to retain fluid.  She is also extremely thirsty.

## 2021-02-13 DIAGNOSIS — K219 Gastro-esophageal reflux disease without esophagitis: Secondary | ICD-10-CM | POA: Diagnosis not present

## 2021-02-13 DIAGNOSIS — G8929 Other chronic pain: Secondary | ICD-10-CM | POA: Diagnosis not present

## 2021-02-13 DIAGNOSIS — I110 Hypertensive heart disease with heart failure: Secondary | ICD-10-CM | POA: Diagnosis not present

## 2021-02-13 DIAGNOSIS — E669 Obesity, unspecified: Secondary | ICD-10-CM | POA: Diagnosis not present

## 2021-02-13 DIAGNOSIS — M436 Torticollis: Secondary | ICD-10-CM | POA: Diagnosis not present

## 2021-02-13 DIAGNOSIS — Z7982 Long term (current) use of aspirin: Secondary | ICD-10-CM | POA: Diagnosis not present

## 2021-02-13 DIAGNOSIS — F32A Depression, unspecified: Secondary | ICD-10-CM | POA: Diagnosis not present

## 2021-02-13 DIAGNOSIS — I255 Ischemic cardiomyopathy: Secondary | ICD-10-CM | POA: Diagnosis not present

## 2021-02-13 DIAGNOSIS — M5031 Other cervical disc degeneration,  high cervical region: Secondary | ICD-10-CM | POA: Diagnosis not present

## 2021-02-13 DIAGNOSIS — M8448XS Pathological fracture, other site, sequela: Secondary | ICD-10-CM | POA: Diagnosis not present

## 2021-02-13 DIAGNOSIS — I5043 Acute on chronic combined systolic (congestive) and diastolic (congestive) heart failure: Secondary | ICD-10-CM | POA: Diagnosis not present

## 2021-02-13 DIAGNOSIS — J439 Emphysema, unspecified: Secondary | ICD-10-CM | POA: Diagnosis not present

## 2021-02-13 DIAGNOSIS — R911 Solitary pulmonary nodule: Secondary | ICD-10-CM | POA: Diagnosis not present

## 2021-02-13 DIAGNOSIS — I700 Atherosclerosis of aorta: Secondary | ICD-10-CM | POA: Diagnosis not present

## 2021-02-13 DIAGNOSIS — Z9181 History of falling: Secondary | ICD-10-CM | POA: Diagnosis not present

## 2021-02-13 DIAGNOSIS — I252 Old myocardial infarction: Secondary | ICD-10-CM | POA: Diagnosis not present

## 2021-02-13 DIAGNOSIS — Z7902 Long term (current) use of antithrombotics/antiplatelets: Secondary | ICD-10-CM | POA: Diagnosis not present

## 2021-02-13 DIAGNOSIS — Z9981 Dependence on supplemental oxygen: Secondary | ICD-10-CM | POA: Diagnosis not present

## 2021-02-13 DIAGNOSIS — Z7951 Long term (current) use of inhaled steroids: Secondary | ICD-10-CM | POA: Diagnosis not present

## 2021-02-13 DIAGNOSIS — I2510 Atherosclerotic heart disease of native coronary artery without angina pectoris: Secondary | ICD-10-CM | POA: Diagnosis not present

## 2021-02-13 DIAGNOSIS — Z791 Long term (current) use of non-steroidal anti-inflammatories (NSAID): Secondary | ICD-10-CM | POA: Diagnosis not present

## 2021-02-13 DIAGNOSIS — I083 Combined rheumatic disorders of mitral, aortic and tricuspid valves: Secondary | ICD-10-CM | POA: Diagnosis not present

## 2021-02-13 DIAGNOSIS — I447 Left bundle-branch block, unspecified: Secondary | ICD-10-CM | POA: Diagnosis not present

## 2021-02-13 DIAGNOSIS — J9621 Acute and chronic respiratory failure with hypoxia: Secondary | ICD-10-CM | POA: Diagnosis not present

## 2021-02-13 DIAGNOSIS — Z79891 Long term (current) use of opiate analgesic: Secondary | ICD-10-CM | POA: Diagnosis not present

## 2021-02-14 ENCOUNTER — Telehealth: Payer: Self-pay | Admitting: Physician Assistant

## 2021-02-14 NOTE — Telephone Encounter (Signed)
Patient was recently admitted to the hospital with acute respiratory failure secondary to COPD exacerbation and heart failure.  She received antibiotic, steroid and also diuresis.  Since discharge, she continued to have a yellow productive cough.  She described her sputum as yellow and thick.  2 days ago, she called the cardiology service complaining of 3 pound weight gain, she was instructed to take extra dose of Lasix.  Since then, her weight is back to her baseline.  However her productive cough has not improved.  Since her weight is stable, I suspect her productive cough is more pulmonary rather than cardiac.  I advised her to call Dr. Juline Patch office.

## 2021-02-15 ENCOUNTER — Ambulatory Visit (INDEPENDENT_AMBULATORY_CARE_PROVIDER_SITE_OTHER): Payer: Medicare Other

## 2021-02-15 ENCOUNTER — Telehealth: Payer: Self-pay | Admitting: Cardiology

## 2021-02-15 ENCOUNTER — Telehealth: Payer: Self-pay | Admitting: Pulmonary Disease

## 2021-02-15 ENCOUNTER — Ambulatory Visit: Payer: Medicare Other

## 2021-02-15 DIAGNOSIS — R058 Other specified cough: Secondary | ICD-10-CM

## 2021-02-15 DIAGNOSIS — J439 Emphysema, unspecified: Secondary | ICD-10-CM | POA: Diagnosis not present

## 2021-02-15 DIAGNOSIS — R062 Wheezing: Secondary | ICD-10-CM

## 2021-02-15 DIAGNOSIS — M542 Cervicalgia: Secondary | ICD-10-CM | POA: Diagnosis not present

## 2021-02-15 DIAGNOSIS — R059 Cough, unspecified: Secondary | ICD-10-CM | POA: Diagnosis not present

## 2021-02-15 MED ORDER — PREDNISONE 20 MG PO TABS: 40.0000 mg | ORAL_TABLET | Freq: Every day | ORAL | 0 refills | Status: DC

## 2021-02-15 NOTE — Telephone Encounter (Signed)
Spoke to patient. Patient stated that she was recently admitted 02/06/2021 for respiratory distress and heart attack. She developed cough and wheezing two days ago. Cough is dry and causes sob bilateral side discomfort. Denied f/c/s or additional sx.  She is using albuterol solution BID and Breztri BID. Fully vaccinated against covid and flu. 2L cont. Spo2 is maintaining around 96-97%.  Dr. Valeta Harms, please advise. thanks

## 2021-02-15 NOTE — Telephone Encounter (Signed)
Returned call to patient of Dr. Martinique  She got out of hospital Tues - cough started on Thurs She reports a non-productive cough and then she cannot breathe She does report phlegm, but it won't come up She has not been around anyone with acute illness  She is taking mucinex-DM OTC, tylenol  She uses albuterol neb BID She does not use humidifier  She has a coarse, congested sounding cough on phone but did not appear SOB while on the phone  She was in the hospital for resp distress, had cath - known CAD She has appt with Urban Gibson NP tomorrow  Advised that provider will want to listen to her breathing/lungs, do assessment before potentially prescribing anything for cough   Will send note to NP & MD to review/advise and will call back with updates

## 2021-02-15 NOTE — Telephone Encounter (Signed)
Spoke with patient and reiterated info from [place] message as noted below. Patient reports weight actually decreased from yesterday (186) to today (183lbs) and she peed all day yesterday. Advised she call pulmonary but she said she will wait until after appt tomorrow. Provided instructions on extra lasix PRN if weight gain of 2lbs overnight or 5lbs in 1 week

## 2021-02-15 NOTE — Telephone Encounter (Signed)
She spoke wit Isaac Laud yesterday, recommendations copied below.  "Patient was recently admitted to the hospital with acute respiratory failure secondary to COPD exacerbation and heart failure.  She received antibiotic, steroid and also diuresis.  Since discharge, she continued to have a yellow productive cough.  She described her sputum as yellow and thick.  2 days ago, she called the cardiology service complaining of 3 pound weight gain, she was instructed to take extra dose of Lasix.  Since then, her weight is back to her baseline.  However her productive cough has not improved.  Since her weight is stable, I suspect her productive cough is more pulmonary rather than cardiac.  I advised her to call Dr. Juline Patch office."  Please ensure patient weight is stable. If weight up 2 lbs overnight or 5 lbs in one week, may take extra dose of Lasix. Otherwise, needs to follow up with Dr. Juline Patch office.   Loel Dubonnet, NP

## 2021-02-15 NOTE — Progress Notes (Signed)
Office Visit    Patient Name: BEATRIS Martinez Date of Encounter: 02/16/2021  PCP:  Harlan Stains, MD   [place]  Cardiologist:  Peter Martinique, MD  Advanced Practice Provider:  No care team member to display Electrophysiologist:  None      Chief Complaint    [name] is a 76 y.o. female with a hx of COPD/emphysemia on 2L O2, CAD, RCC s/p nephrectomy, combined systolic and diastolic heart failure, ischemic cardiomyopathy presents today for hospital follow up   Past Medical History    Past Medical History:  Diagnosis Date   (HFpEF) heart failure with preserved ejection fraction ([place]) 02/24/2017   Echo 05/2020: EF 55, severe inferior HK, mild LVH, Gr1 DD, normal RVSF, RVSP 32.4, mild BAE, mild MR, AV sclerosis without stenosis   Alcoholism ([place])    recovering since 2000   Anxiety and depression    Arthritis BACK   Bipolar disorder ([place])    Blepharospasm LEFT EYE   Chronic back pain    COPD (chronic obstructive pulmonary disease) ([place])    Coronary artery disease    s/p Inf MI in 2000 tx with PCI to RCA // Myoview in 2019 with inf-lat scar and mild peri-infarct ischemia   Emphysema    GERD (gastroesophageal reflux disease)    History of alcohol abuse RECOVERING SINCE 2000   History of Left renal mass 03/02/2012   underwent partial nephrectomy   HTN (hypertension)    Idiopathic acute facial nerve palsy LEFT SIDE--  BOTOX THERAPY   Inferior MI ([place]) 2000--  POST PTCA W/ STENT X1   Ischemic cardiomyopathy    EF returned to normal   Osteoporosis    Other and unspecified general anesthetics causing adverse effect in therapeutic use post op delirium--  last anes record w/ chart  from   09-22-2009 (spinal w/ light sedation)   Peripheral vascular disease ([place]) POST RIGHT CAROTID SURG.  1995   Renal cell carcinoma ([place]) 07/09/2012   Left mass   Rosacea LEFT FACIAL RASH   S/P radiation therapy 02/21/2012   38.75 Gy HDR 5 Fractions- vaginal cuff    Scoliosis    Seizures ([place])    x 1 after abrupt discontinuation of  Clonidine   Status post carotid endarterectomy RIGHT --  1995   Status post primary angioplasty with coronary stent 2000--  POST INFERIOR MI   Unstable balance WALKS W/ CANE   Vaginal cancer ([place])    Past Surgical History:  Procedure Laterality Date   BREAST EXCISIONAL BIOPSY Left 2019   b9 axilla Bx X 3   CAROTID ENDARTERECTOMY  1995   RIGHT   CATARACT EXTRACTION W/ INTRAOCULAR LENS  IMPLANT, BILATERAL     CERVICAL CONIZATION W/BX  09-23-2008   CORONARY ANGIOPLASTY WITH STENT PLACEMENT  2000-   INFERIOR MI   X1 STENT TO RCA   EUS N/A 03/05/2012   Procedure: FULL UPPER ENDOSCOPIC ULTRASOUND (EUS) RADIAL and EGD;  Surgeon: Milus Banister, MD;  Location: [facility];  Service: Endoscopy;  Laterality: N/A;  ercp scope first than eus scope   HEMIARTHROPLASTY HIP  12-26-2008   LEFT FEMORAL NECK FX   LEFT HEART CATH AND CORONARY ANGIOGRAPHY N/A 02/08/2021   Procedure: LEFT HEART CATH AND CORONARY ANGIOGRAPHY;  Surgeon: Martinique, Peter M, MD;  Location: [facility];  Service: Cardiovascular;  Laterality: N/A;   ORIF HIP FRACTURE  02-13-2007   RIGHT FEMORAL NECK FX   ORIF  RIGHT DISTAL RADIUS AND RIGHT PROXIMAL HUMEROUS NECK FX'S  10-10-2005   POSTERIOR CERVICAL LAMINECTOMY N/A 08/28/2020   Procedure: Laminectomy and Foraminotomy - Cervical Two-Three, Cervical Three-Four, with lateral mass fusion/ fixation;  Surgeon: Dawley, Theodoro Doing, DO;  Location: [facility];  Service: Neurosurgery;  Laterality: N/A;   RIGHT SHOULDER SURG.  2007   ROBOTIC ASSITED PARTIAL NEPHRECTOMY Left 07/09/2012   Procedure: ROBOTIC ASSITED PARTIAL NEPHRECTOMY;  Surgeon: Dutch Gray, MD;  Location: [facility];  Service: Urology;  Laterality: Left;   TOTAL HIP ARTHROPLASTY  04-15-2008   POST FAILED  RIGHT HIP ORIF FEMORAL FX   TOTAL KNEE ARTHROPLASTY  09-22-2009   RIGHT   UPPER RIGHT VAGINAL REGION  12/28/11   BIOPSY: SQUAMOUS CELL CARCINOMA   VAGINAL  HYSTERECTOMY  07/06/2009   Secondary to dysplasia    Allergies  Allergies  Allergen Reactions   Cephalexin     Other reaction(s): diarrhea   Doxycycline Diarrhea   Gabapentin Other (See Comments)    sleepiness   Morphine And Related Anxiety    Was confused when taking it    History of Present Illness    NYSHA Martinez is a 76 y.o. female with a hx of COPD/emphysemia on 2L O2, CAD, RCC s/p nephrectomy, combined systolic and diastolic heart failure, ischemic cardiomyopathy last seen while hospitalized.  Admitted 02/06/21-02/09/21 after presenting with respiratory distress. CXR bilateral interstitial infiltrates. CT chest with GGO and interstitial opacities concerning for pulmonary edema. Treated with IV lasix, bronchodilators. Due to new LBBB, elevated troponins, LVEF 40-4% and wall motion abnormalities cardiac cath performed 1/23 demonstrating 3 vessel obstructive coronary disease. Not CABG candidate due to chronic respiratory failure, frailty. Vessels tortuous and not amenable for PCI. Plavix added to Aspirin and Bisoprolol initiated. She was discharged on antibiotics and steroids.   She contacted pulmonology yesterday and was recommended for chest x-ray, prednisone 40 mg X 5 days.  Chest x-ray showed reticular nodular opacity at posterior lung base concerning for multifocal infection   She presents today for follow up with her friend [name]. Her O2 on entering exam room was 80% on her 2L though quickly improved to 92% on 2L through rest and deep breathing. Established with [facility] and has OT this afternoon. Long discussion regarding medical management of CAD and CHF. She is getting Jardiance for free through the [facility] Department. Does not have prescription drug coverage through Medicare.  She admits exertional dyspnea as well as cough.  She is tearful and sharing pain that cough makes her very scared as they associated shortness of breath and discomfort make her concerned about  requiring readmission to the hospital.  She reports no edema and her weight is trending down by home monitoring.  Denies chest pain.  EKGs/Labs/Other Studies Reviewed:   The following studies were reviewed today:  Echo 02/07/21: 1. Septal apical and inferior basal hypokinesis . Left ventricular  ejection fraction, by estimation, is 40 to 45%. The left ventricle has  mildly decreased function. The left ventricle demonstrates regional wall  motion abnormalities (see scoring  diagram/findings for description). The left ventricular internal cavity  size was mildly dilated. There is mild left ventricular hypertrophy. Left  ventricular diastolic parameters were normal.   2. Right ventricular systolic function is normal. The right ventricular  size is normal. There is mildly elevated pulmonary artery systolic  pressure.   3. Left atrial size was moderately dilated.   4. The mitral valve is abnormal. Mild mitral valve regurgitation. No  evidence  of mitral stenosis. Moderate mitral annular calcification.   5. Calcified non coronary cusp. The aortic valve is tricuspid. There is  mild calcification of the aortic valve. There is mild thickening of the  aortic valve. Aortic valve regurgitation is not visualized. Aortic valve  sclerosis is present, with no  evidence of aortic valve stenosis.   6. The inferior vena cava is normal in size with greater than 50%  respiratory variability, suggesting right atrial pressure of 3 mmHg.    LHC 02/06/21:    Prox LAD lesion is 80% stenosed.   Mid LAD lesion is 70% stenosed.   2nd Mrg lesion is 70% stenosed.   Ost RCA to Prox RCA lesion is 20% stenosed.   Prox RCA to Mid RCA lesion is 60% stenosed.   Dist RCA lesion is 85% stenosed.   LV end diastolic pressure is moderately elevated.   3 vessel obstructive CAD. Patient's arteries are severely calcified and tortuous. There is a 80% stenosis in the LAD just proximal to the first septal perforator and a  segemental 70% lesion in the mid vessel. There is a 70% stenosis of the second OM. The stent in the proximal RCA is patent but there is a 60% stenosis in the mid vessel and an 85% eccentric calcified nodular lesion in the distal RCA Moderately elevated LVEDP 29 mm Hg.  EKG: No EKG today  Recent Labs: 05/31/2020: Magnesium 2.2 02/06/2021: ALT 25 02/07/2021: B Natriuretic Peptide 1,642.5 02/08/2021: Hemoglobin 13.3; Platelets 276 02/09/2021: BUN 24; Creatinine, Ser 1.06; Potassium 3.5; Sodium 135  Recent Lipid Panel    Component Value Date/Time   CHOL 166 02/09/2021 0438   TRIG 96 02/09/2021 0438   HDL 66 02/09/2021 0438   CHOLHDL 2.5 02/09/2021 0438   VLDL 19 02/09/2021 0438   LDLCALC 81 02/09/2021 0438   Home Medications   Current Meds  Medication Sig   acetaminophen (TYLENOL) 325 MG tablet Take 2 tablets (650 mg total) by mouth every 4 (four) hours as needed for mild pain ((score 1 to 3) or temp > 100.5).   albuterol (PROVENTIL) (2.5 MG/3ML) 0.083% nebulizer solution Take 3 mLs (2.5 mg total) by nebulization every 6 (six) hours as needed for wheezing or shortness of breath.   albuterol (VENTOLIN HFA) 108 (90 Base) MCG/ACT inhaler Inhale 2 puffs into the lungs every 6 (six) hours as needed for wheezing.   amlodipine-atorvastatin (CADUET) 10-20 MG tablet Take 1 tablet by mouth daily.   ARIPiprazole (ABILIFY) 10 MG tablet Take 1 tablet (10 mg total) by mouth daily.   Ascorbic Acid (VITAMIN C) 1000 MG tablet Take 1,000 mg by mouth daily.   aspirin EC 81 MG tablet Take 81 mg by mouth every morning.   bisoprolol (ZEBETA) 5 MG tablet Take 0.5 tablets (2.5 mg total) by mouth daily.   Budeson-Glycopyrrol-Formoterol (BREZTRI AEROSPHERE) 160-9-4.8 MCG/ACT AERO Inhale 2 puffs into the lungs in the morning and at bedtime.   Calcium Carb-Cholecalciferol 600-400 MG-UNIT TABS Take 1 tablet by mouth daily.   celecoxib (CELEBREX) 200 MG capsule Take 400 mg by mouth daily.   Cholecalciferol (VITAMIN D3)  50 MCG (2000 UT) TABS Take 2,000 Units by mouth daily.   clopidogrel (PLAVIX) 75 MG tablet Take 1 tablet (75 mg total) by mouth daily.   denosumab (PROLIA) 60 MG/ML SOSY injection Inject 60 mg into the skin every 6 (six) months.   dexlansoprazole (DEXILANT) 60 MG capsule Take 1 capsule (60 mg total) by mouth daily.   docusate sodium (  COLACE) 100 MG capsule Take 100 mg by mouth daily as needed for mild constipation.   empagliflozin (JARDIANCE) 10 MG TABS tablet Take 1 tablet (10 mg total) by mouth daily.   ferrous sulfate 325 (65 FE) MG tablet Take 1 tablet (325 mg total) by mouth daily.   furosemide (LASIX) 20 MG tablet Take 1 tablet (20 mg total) by mouth daily.   hydrALAZINE (APRESOLINE) 25 MG tablet Take 1 tablet (25 mg total) by mouth 2 (two) times daily.   lamoTRIgine (LAMICTAL) 100 MG tablet Take 2 tablets (200 mg total) by mouth 2 (two) times daily.   montelukast (SINGULAIR) 10 MG tablet Take 1 tablet (10 mg total) by mouth at bedtime. TAKE 1 TABLET BY MOUTH EVERYDAY AT BEDTIME (Patient taking differently: Take 10 mg by mouth at bedtime.)   Multiple Vitamin (MULTIVITAMIN) capsule Take 1 capsule by mouth daily.    nitroGLYCERIN (NITROSTAT) 0.4 MG SL tablet Place 1 tablet (0.4 mg total) under the tongue every 5 (five) minutes as needed for chest pain.   Omega-3 Fatty Acids (FISH OIL) 1000 MG CAPS Take 1,000 mg by mouth 2 (two) times daily.   Oxycodone HCl 10 MG TABS Take 1 tablet (10 mg total) by mouth every 4 (four) hours as needed for severe pain.   Polyethyl Glycol-Propyl Glycol (SYSTANE) 0.4-0.3 % GEL ophthalmic gel Place 1 application into both eyes daily as needed (dry eyes).   potassium chloride (KLOR-CON) 8 MEQ tablet Take 1 tablet (8 mEq total) by mouth daily.   predniSONE (DELTASONE) 20 MG tablet Take 2 tablets (40 mg total) by mouth daily with breakfast.   sertraline (ZOLOFT) 50 MG tablet Take 1 tablet (50 mg total) by mouth daily.   tiZANidine (ZANAFLEX) 4 MG tablet Take 1 tablet  (4 mg total) by mouth every 6 (six) hours.   valsartan (DIOVAN) 160 MG tablet Take 2 tablets (320 mg total) by mouth daily.     Review of Systems      All other systems reviewed and are otherwise negative except as noted above.  Physical Exam    VS:  BP 140/78    Pulse 79    Ht 5\' 3"  (1.6 m)    Wt 185 lb 9.6 oz (84.2 kg)    SpO2 (!) 80% Comment: 2L o2   BMI 32.88 kg/m  , BMI Body mass index is 32.88 kg/m.  Wt Readings from Last 3 Encounters:  02/16/21 185 lb 9.6 oz (84.2 kg)  02/06/21 183 lb 13.8 oz (83.4 kg)  01/29/21 186 lb 3.2 oz (84.5 kg)    GEN: Frail-appearing, well developed, in no acute distress. HEENT: normal. Neck: Supple, no JVD, carotid bruits, or masses. Cardiac: RRR, no murmurs, rubs, or gallops. No clubbing, cyanosis, edema.  Radials/PT 2+ and equal bilaterally.  Respiratory:  Respirations regular and unlabored, diminished bases bilaterally, on 2 L GI: Soft, nontender, nondistended. MS: No deformity or atrophy. Skin: Warm and dry, no rash. Neuro:  Strength and sensation are intact. Psych: Normal affect.  Assessment & Plan    COPD / Chronic respiratory failure with hypoxia - CXR yesterday with continued evidence of possible multifocal infection.  Completed course of abx. Now with significant cough for which pulmonology started Prednisone yesterday. Continue to follow with pulmonology. As she is euvolemic and weight trending down, low suspicion HF contributory to her dyspnea. Continue to follow with pulmonology.   Combined systolic and diastolic heart failure / ICM - LVEF 40-45% during recent admission. Known multivessel coronary  disease not CABG candidate. Euvolemic and well compensated on exam. Weight trending down at home.  GDMT includes Jardiance, Bisoprolol, Lasix, Valsartan.  For weight gain of 2 pounds overnight or 5 pounds in 1 week she may take additional Lasix 20 mg.  . Low sodium diet, fluid restriction <2L encouraged. Discussed typical progression of CHF/CAD  and initiated discussion regarding palliative care if medication management does not improve symptoms.  Will transition from Valsartan to [facility]. 30-day supply and free coupon sent to her local pharmacy [facility]). Called and spoke with [facility] - she is enrolled in their MAP program and they will be able to order [facility] for her to receive for free.  CAD - Multivessel coronary disease by cath 01/2021 not amenable for PCI with tortuous vessles and not candidate for CABG, plan for medical management. GDMT includes Atorvastatin, bisoprolol, aspirin, Jardiance.  HTN -BP elevated today though has not been taking her morning medications.  BP most often in the 140s in the AM prior to medication and 110s in the afternoon.  Her antihypertensive regimen includes bisoprolol 2.5mg , amlodipine 10 mg, hydralazine 25 mg 3 times daily. As above, transition Valsartan to Entresto. Further uptitration pending BP response.  For HF benefit could consider discontinuation of Amlodipine to allow for up-titration of other agents if hypotension becomes limiting factor.   LLL pulmonary nodule - 02/06/21 1.5cm by CT. Repeat CT in 3 months recommended. Follows with pulmonology.   HLD - Continue Atorvastatin. 02/09/21 LDL 81. Shares with me that she has made dietary changes.   RCC s/p partial L nephrectomy - Careful titration of diuretic and antihypertensive.  BMP today for monitoring.   Disposition: Follow up in 2 month(s) with Peter Martinique, MD or APP.  Signed, Loel Dubonnet, NP 02/16/2021, 8:57 AM [place]

## 2021-02-15 NOTE — Telephone Encounter (Signed)
Pt c/o Shortness Of Breath: STAT if SOB developed within the last 24 hours or pt is noticeably SOB on the phone  1. Are you currently SOB (can you hear that pt is SOB on the phone)? no  2. How long have you been experiencing SOB? About a week  3. Are you SOB when sitting or when up moving around? After a coughing spell   4. Are you currently experiencing any other symptoms?  Patient said she has  a residual cough from a lung infection. She starts coughing sometimes and has a hard time catching her breath after a coughing spell.   She wanted to know if there was anything that could be prescribed to help her get rid of her cough.

## 2021-02-15 NOTE — Telephone Encounter (Signed)
Patient is aware of recommendations and voiced her understanding.  She will come by for CXR today. CXR ordered. Prednisone sent to preferred pharmacy. She will call back for acute visit if sx are not improving.  Nothing further needed at this time.

## 2021-02-16 ENCOUNTER — Encounter (HOSPITAL_BASED_OUTPATIENT_CLINIC_OR_DEPARTMENT_OTHER): Payer: Self-pay | Admitting: Family

## 2021-02-16 ENCOUNTER — Other Ambulatory Visit: Payer: Self-pay

## 2021-02-16 ENCOUNTER — Ambulatory Visit (INDEPENDENT_AMBULATORY_CARE_PROVIDER_SITE_OTHER): Payer: Medicare Other | Admitting: Family

## 2021-02-16 ENCOUNTER — Telehealth: Payer: Self-pay | Admitting: Pulmonary Disease

## 2021-02-16 VITALS — BP 140/78 | HR 79 | Ht 63.0 in | Wt 185.6 lb

## 2021-02-16 DIAGNOSIS — I10 Essential (primary) hypertension: Secondary | ICD-10-CM | POA: Diagnosis not present

## 2021-02-16 DIAGNOSIS — I2510 Atherosclerotic heart disease of native coronary artery without angina pectoris: Secondary | ICD-10-CM

## 2021-02-16 DIAGNOSIS — Z905 Acquired absence of kidney: Secondary | ICD-10-CM

## 2021-02-16 DIAGNOSIS — E785 Hyperlipidemia, unspecified: Secondary | ICD-10-CM | POA: Diagnosis not present

## 2021-02-16 DIAGNOSIS — I5042 Chronic combined systolic (congestive) and diastolic (congestive) heart failure: Secondary | ICD-10-CM | POA: Diagnosis not present

## 2021-02-16 MED ORDER — ENTRESTO 24-26 MG PO TABS: 1.0000 | ORAL_TABLET | Freq: Two times a day (BID) | ORAL | 0 refills | Status: DC

## 2021-02-16 MED ORDER — ENTRESTO 49-51 MG PO TABS: 1.0000 | ORAL_TABLET | Freq: Two times a day (BID) | ORAL | 5 refills | Status: DC

## 2021-02-16 NOTE — Telephone Encounter (Signed)
Spoke with the pt  She is using adapt for o2  She is wanting lighter o2 system  She states not sure when she really needs to use her o2  OV for eval and walk scheduled for 02/18/21

## 2021-02-16 NOTE — Patient Instructions (Addendum)
Medication Instructions:  Your physician has recommended you make the following change in your medication:   STOP Valsartan  START Entresto one 49-51mg  tablet twice per day  *If you need a refill on your cardiac medications before your next appointment, please call your pharmacy*   Lab Work: Your physician recommends that you return for lab work Today- BMET, CBC w/diff   If you have labs (blood work) drawn today and your tests are completely normal, you will receive your results only by: MyChart Message (if you have MyChart) OR A paper copy in the mail If you have any lab test that is abnormal or we need to change your treatment, we will call you to review the results.   Testing/Procedures: None ordered today    Follow-Up: At [facility], you and your health needs are our priority.  As part of our continuing mission to provide you with exceptional heart care, we have created designated Provider Care Teams.  These Care Teams include your primary Cardiologist (physician) and Advanced Practice Providers (APPs -  Physician Assistants and Nurse Practitioners) who all work together to provide you with the care you need, when you need it.  We recommend signing up for the patient portal called "MyChart".  Sign up information is provided on this After Visit Summary.  MyChart is used to connect with patients for Virtual Visits (Telemedicine).  Patients are able to view lab/test results, encounter notes, upcoming appointments, etc.  Non-urgent messages can be sent to your provider as well.   To learn more about what you can do with MyChart, go to [email or web address].    Your next appointment:   2 month(s)  The format for your next appointment:   In Person  Provider:   Laurann [place], NP{

## 2021-02-16 NOTE — Telephone Encounter (Signed)
Spoke to patient. She would like an order placed to adapt for POC.  Dr. Valeta Harms, please advise. Thanks

## 2021-02-17 ENCOUNTER — Telehealth (HOSPITAL_BASED_OUTPATIENT_CLINIC_OR_DEPARTMENT_OTHER): Payer: Self-pay

## 2021-02-17 DIAGNOSIS — E875 Hyperkalemia: Secondary | ICD-10-CM

## 2021-02-17 LAB — BASIC METABOLIC PANEL
BUN/Creatinine Ratio: 19 (ref 12–28)
BUN: 20 mg/dL (ref 8–27)
CO2: 22 mmol/L (ref 20–29)
Calcium: 9.4 mg/dL (ref 8.7–10.3)
Chloride: 106 mmol/L (ref 96–106)
Creatinine, Ser: 1.03 mg/dL — ABNORMAL HIGH (ref 0.57–1.00)
Glucose: 125 mg/dL — ABNORMAL HIGH (ref 70–99)
Potassium: 5.4 mmol/L — ABNORMAL HIGH (ref 3.5–5.2)
Sodium: 144 mmol/L (ref 134–144)
eGFR: 56 mL/min/{1.73_m2} — ABNORMAL LOW (ref >59)

## 2021-02-17 LAB — CBC WITH DIFFERENTIAL/PLATELET
Basophils Absolute: 0 10*3/uL (ref 0.0–0.2)
Basos: 0 %
EOS (ABSOLUTE): 0.1 10*3/uL (ref 0.0–0.4)
Eos: 1 %
Hematocrit: 37.7 % (ref 34.0–46.6)
Hemoglobin: 13.9 g/dL (ref 11.1–15.9)
Immature Grans (Abs): 0 10*3/uL (ref 0.0–0.1)
Immature Granulocytes: 0 %
Lymphocytes Absolute: 0.7 10*3/uL (ref 0.7–3.1)
Lymphs: 7 %
MCH: 34.3 pg — ABNORMAL HIGH (ref 26.6–33.0)
MCHC: 36.9 g/dL — ABNORMAL HIGH (ref 31.5–35.7)
MCV: 93 fL (ref 79–97)
Monocytes Absolute: 0.2 10*3/uL (ref 0.1–0.9)
Monocytes: 2 %
Neutrophils Absolute: 9.2 10*3/uL — ABNORMAL HIGH (ref 1.4–7.0)
Neutrophils: 90 %
Platelets: 283 10*3/uL (ref 150–450)
RBC: 4.05 x10E6/uL (ref 3.77–5.28)
RDW: 12.2 % (ref 11.7–15.4)
WBC: 10.3 10*3/uL (ref 3.4–10.8)

## 2021-02-17 NOTE — Telephone Encounter (Addendum)
Results called to patient, labs ordered and mailed to patient!      ----- Message from Loel Dubonnet, NP sent at 02/17/2021 11:53 AM EST ----- Stable kidney function. Potassium mildly elevated. Recommend she hold potassium for two days then resume one tablet daily. Repeat BMP early next week.

## 2021-02-18 ENCOUNTER — Telehealth: Payer: Self-pay | Admitting: Adult Health

## 2021-02-18 NOTE — Telephone Encounter (Signed)
Dr. Valeta Harms patient last seen 12/23/2020 for emphysema. Recent admission 02/06/2021.  Called and spoke to patient.  Patient reports of dry cough at times productive with yellow sputum, wheezing and chest congestion x1w.  Denied f/c/s or additional sx.  Sob is baseline. On 2L cont. Spo2 maintaining around 95-96%. Currently taking prednisone 40mg . She will complete course 02/20/2021. Weight gain of 1lb 6oz since yesterday. Currently taking Lasix 20mg . Fully vaccinated against covid and flu.  Neg for Flu and covid  02/06/2021. She is scheduled to see TP tomorrow. She can not come in today because she has another appt. She would like recommendations.   She requested that we call back on her mobile number.   Beth, please advise. Dr. Valeta Harms is unavailable. Thanks

## 2021-02-18 NOTE — Telephone Encounter (Signed)
Patient is aware of recommendations and voiced her understanding. Nothing further needed.   

## 2021-02-18 NOTE — Telephone Encounter (Signed)
Pt states she is coughing a lot and is very congested. States she has gained a pound and 6 ounces since yesterday. Pt is scheduled to see Tammy tomorrow; I offered for her to come in today, but she denied because she has another appt already. Would like something called in. Please advise.

## 2021-02-18 NOTE — Telephone Encounter (Signed)
Advise she take mucinex 600mg  twice daily. Continue prednisone. Use albuterol 2 puffs every 4-6 hours for wheezing. No change to diuretic. If symptoms worsen present to ED. Sending to Tammy as FYI as she is seeing her tomorrow

## 2021-02-19 ENCOUNTER — Encounter: Payer: Self-pay | Admitting: Adult Health

## 2021-02-19 ENCOUNTER — Ambulatory Visit (INDEPENDENT_AMBULATORY_CARE_PROVIDER_SITE_OTHER): Payer: Medicare Other | Admitting: Adult Health

## 2021-02-19 ENCOUNTER — Other Ambulatory Visit: Payer: Self-pay

## 2021-02-19 VITALS — BP 100/58 | HR 80 | Temp 97.9°F | Ht 63.0 in | Wt 190.0 lb

## 2021-02-19 DIAGNOSIS — I5043 Acute on chronic combined systolic (congestive) and diastolic (congestive) heart failure: Secondary | ICD-10-CM | POA: Diagnosis not present

## 2021-02-19 DIAGNOSIS — I2510 Atherosclerotic heart disease of native coronary artery without angina pectoris: Secondary | ICD-10-CM

## 2021-02-19 DIAGNOSIS — J439 Emphysema, unspecified: Secondary | ICD-10-CM

## 2021-02-19 DIAGNOSIS — R911 Solitary pulmonary nodule: Secondary | ICD-10-CM

## 2021-02-19 DIAGNOSIS — J9611 Chronic respiratory failure with hypoxia: Secondary | ICD-10-CM | POA: Diagnosis not present

## 2021-02-19 MED ORDER — AMOXICILLIN-POT CLAVULANATE 875-125 MG PO TABS: 1.0000 | ORAL_TABLET | Freq: Two times a day (BID) | ORAL | 0 refills | Status: AC

## 2021-02-19 MED ORDER — PREDNISONE 10 MG PO TABS: ORAL_TABLET | ORAL | 0 refills | Status: DC

## 2021-02-19 NOTE — Patient Instructions (Addendum)
Augmentin 875mg  Twice daily  for 1 week , take with food.  Mucinex DM Twice daily  As needed  cough/congestion  Fluids and rest  Albuterol inhaler .  Extra Lasix 20mg  daily for next 2 days .  Prednisone taper over next week.  Continue on Oxygen 2l/m . Continue on BREZTRI 2 puffs Twice daily   Albuterol inhaler or neb every 6hrs as needed .  CT chest in 3 months (left lower lung nodule )  Follow up with in 1 week with Dr. Valeta Harms or Jeryn Bertoni NP and As needed   Please contact office for sooner follow up if symptoms do not improve or worsen or seek emergency care

## 2021-02-19 NOTE — Assessment & Plan Note (Signed)
Patient is continue on oxygen at 2 L to maintain O2 saturations greater than 88 to 90%.  Patient is advised if oxygen levels are dropping she is to contact us mainly or go to the emergency room.

## 2021-02-19 NOTE — Progress Notes (Signed)
@Patient  ID: [name], female    DOB: 1945/06/09, 76 y.o.   MRN: [id number]  Chief Complaint  Patient presents with   Follow-up    Referring provider: Harlan Stains, MD  HPI: 76 year old female former smoker followed for severe COPD with emphysema, oxygen dependent respiratory failure Participates in the low-dose CT screening program  TEST/EVENTS :   02/19/2021 Follow up : COPD with emphysema, oxygen dependent respiratory failure and post hospital follow-up. Patient presents for posthospital follow-up.  Patient was admitted last week for acute on chronic hypoxic respiratory failure with COPD exacerbation, pneumonia, decompensated heart failure and non-STEMI.  Patient was treated with antibiotics, steroids and nebulized bronchodilators, diuresis. Patient was seen by cardiology this week and changed from [place] to [facility]. Patient says she continues to have ongoing coughing and shortness of breath decreased activity tolerance.,  Thick yellow mucus.  Patient was called and prednisone a few days ago.  Chest x-ray on January 30 that showed reticular opacities along the posterior lung bases laterally. Patient denies any hemoptysis, chest pain, orthopnea, edema. CT chest with contrast on February 06, 2021 showed moderate to severe emphysema, mild groundglass opacities in both lungs.  And a 1.5 nodular opacity in the left lower lobe along with left basilar atelectasis.   Allergies  Allergen Reactions   Cephalexin     Other reaction(s): diarrhea   Doxycycline Diarrhea   Gabapentin Other (See Comments)    sleepiness   Morphine And Related Anxiety    Was confused when taking it    Immunization History  Administered Date(s) Administered   Fluad Quad(high Dose 65+) 11/01/2018   Influenza Split 01/18/2008, 11/10/2009   Influenza, High Dose Seasonal PF 09/30/2011   Influenza,inj,quad, With Preservative 10/17/2016   Influenza-Unspecified 12/07/2010, 09/17/2013, 11/16/2020   Moderna  Sars-Covid-2 Vaccination 03/01/2019, 03/29/2019, 12/02/2019   OPV 08/24/2020   Pneumococcal Conjugate-13 08/23/2018   Pneumococcal Polysaccharide-23 04/12/2010   Pneumococcal-Unspecified 01/18/2008, 04/12/2010   Tdap 04/25/2011   Zoster Recombinat (Shingrix) 08/06/2017   Zoster, Live 04/28/2010, 08/06/2017    Past Medical History:  Diagnosis Date   (HFpEF) heart failure with preserved ejection fraction ([place]) 02/24/2017   Echo 05/2020: EF 55, severe inferior HK, mild LVH, Gr1 DD, normal RVSF, RVSP 32.4, mild BAE, mild MR, AV sclerosis without stenosis   Alcoholism ([place])    recovering since 2000   Anxiety and depression    Arthritis BACK   Bipolar disorder (HCC)    Blepharospasm LEFT EYE   Chronic back pain    COPD (chronic obstructive pulmonary disease) ([place])    Coronary artery disease    s/p Inf MI in 2000 tx with PCI to RCA // Myoview in 2019 with inf-lat scar and mild peri-infarct ischemia   Emphysema    GERD (gastroesophageal reflux disease)    History of alcohol abuse RECOVERING SINCE 2000   History of Left renal mass 03/02/2012   underwent partial nephrectomy   HTN (hypertension)    Idiopathic acute facial nerve palsy LEFT SIDE--  BOTOX THERAPY   Inferior MI ([place]) 2000--  POST PTCA W/ STENT X1   Ischemic cardiomyopathy    EF returned to normal   Osteoporosis    Other and unspecified general anesthetics causing adverse effect in therapeutic use post op delirium--  last anes record w/ chart  from   09-22-2009 (spinal w/ light sedation)   Peripheral vascular disease ([place]) POST RIGHT CAROTID SURG.  1995   Renal cell carcinoma ([place]) 07/09/2012   Left mass  Rosacea LEFT FACIAL RASH   S/P radiation therapy 02/21/2012   38.75 Gy HDR 5 Fractions- vaginal cuff   Scoliosis    Seizures (HCC)    x 1 after abrupt discontinuation of  Clonidine   Status post carotid endarterectomy RIGHT --  1995   Status post primary angioplasty with coronary stent 2000--  POST INFERIOR MI    Unstable balance WALKS W/ CANE   Vaginal cancer (HCC)     Tobacco History: Social History   Tobacco Use  Smoking Status Former   Packs/day: 2.00   Years: 50.00   Pack years: 100.00   Types: Cigarettes   Quit date: 01/17/2010   Years since quitting: 11.0  Smokeless Tobacco Never  Tobacco Comments   STATES QUIT SMOKING 01-17-2010   Counseling given: Not Answered Tobacco comments: STATES QUIT SMOKING 01-17-2010   Outpatient Medications Prior to Visit  Medication Sig Dispense Refill   acetaminophen (TYLENOL) 325 MG tablet Take 2 tablets (650 mg total) by mouth every 4 (four) hours as needed for mild pain ((score 1 to 3) or temp > 100.5).     albuterol (PROVENTIL) (2.5 MG/3ML) 0.083% nebulizer solution Take 3 mLs (2.5 mg total) by nebulization every 6 (six) hours as needed for wheezing or shortness of breath. 75 mL 12   albuterol (VENTOLIN HFA) 108 (90 Base) MCG/ACT inhaler Inhale 2 puffs into the lungs every 6 (six) hours as needed for wheezing. 18 g 1   amlodipine-atorvastatin (CADUET) 10-20 MG tablet Take 1 tablet by mouth daily.     ARIPiprazole (ABILIFY) 10 MG tablet Take 1 tablet (10 mg total) by mouth daily. 30 tablet 0   Ascorbic Acid (VITAMIN C) 1000 MG tablet Take 1,000 mg by mouth daily.     aspirin EC 81 MG tablet Take 81 mg by mouth every morning.     bisoprolol (ZEBETA) 5 MG tablet Take 0.5 tablets (2.5 mg total) by mouth daily. 15 tablet 0   Budeson-Glycopyrrol-Formoterol (BREZTRI AEROSPHERE) 160-9-4.8 MCG/ACT AERO Inhale 2 puffs into the lungs in the morning and at bedtime. 10.7 g 5   Calcium Carb-Cholecalciferol 600-400 MG-UNIT TABS Take 1 tablet by mouth daily. 60 tablet 0   celecoxib (CELEBREX) 200 MG capsule Take 400 mg by mouth daily.     Cholecalciferol (VITAMIN D3) 50 MCG (2000 UT) TABS Take 2,000 Units by mouth daily. 30 tablet 0   clopidogrel (PLAVIX) 75 MG tablet Take 1 tablet (75 mg total) by mouth daily. 30 tablet 0   denosumab (PROLIA) 60 MG/ML SOSY  injection Inject 60 mg into the skin every 6 (six) months.     dexlansoprazole (DEXILANT) 60 MG capsule Take 1 capsule (60 mg total) by mouth daily. 30 capsule 0   docusate sodium (COLACE) 100 MG capsule Take 100 mg by mouth daily as needed for mild constipation.     empagliflozin (JARDIANCE) 10 MG TABS tablet Take 1 tablet (10 mg total) by mouth daily. 90 tablet 3   ferrous sulfate 325 (65 FE) MG tablet Take 1 tablet (325 mg total) by mouth daily. 30 tablet 3   furosemide (LASIX) 20 MG tablet Take 1 tablet (20 mg total) by mouth daily. 30 tablet 3   hydrALAZINE (APRESOLINE) 25 MG tablet Take 1 tablet (25 mg total) by mouth 2 (two) times daily. 60 tablet 6   lamoTRIgine (LAMICTAL) 100 MG tablet Take 2 tablets (200 mg total) by mouth 2 (two) times daily. 120 tablet 0   montelukast (SINGULAIR) 10 MG tablet Take  1 tablet (10 mg total) by mouth at bedtime. TAKE 1 TABLET BY MOUTH EVERYDAY AT BEDTIME (Patient taking differently: Take 10 mg by mouth at bedtime.) 90 tablet 1   Multiple Vitamin (MULTIVITAMIN) capsule Take 1 capsule by mouth daily.      nitroGLYCERIN (NITROSTAT) 0.4 MG SL tablet Place 1 tablet (0.4 mg total) under the tongue every 5 (five) minutes as needed for chest pain. 25 tablet 3   Omega-3 Fatty Acids (FISH OIL) 1000 MG CAPS Take 1,000 mg by mouth 2 (two) times daily.     Oxycodone HCl 10 MG TABS Take 1 tablet (10 mg total) by mouth every 4 (four) hours as needed for severe pain. 30 tablet 0   Polyethyl Glycol-Propyl Glycol (SYSTANE) 0.4-0.3 % GEL ophthalmic gel Place 1 application into both eyes daily as needed (dry eyes).     potassium chloride (KLOR-CON) 8 MEQ tablet Take 1 tablet (8 mEq total) by mouth daily. 30 tablet 6   predniSONE (DELTASONE) 20 MG tablet Take 2 tablets (40 mg total) by mouth daily with breakfast. 10 tablet 0   sacubitril-valsartan (ENTRESTO) 24-26 MG Take 1 tablet by mouth 2 (two) times daily. 60 tablet 0   sacubitril-valsartan (ENTRESTO) 49-51 MG Take 1 tablet  by mouth 2 (two) times daily. 60 tablet 5   sertraline (ZOLOFT) 50 MG tablet Take 1 tablet (50 mg total) by mouth daily. 30 tablet 0   tiZANidine (ZANAFLEX) 4 MG tablet Take 1 tablet (4 mg total) by mouth every 6 (six) hours. 120 tablet 0   No facility-administered medications prior to visit.     Review of Systems:   Constitutional:   No  weight loss, night sweats,  Fevers, chills,  +fatigue, or  lassitude.  HEENT:   No headaches,  Difficulty swallowing,  Tooth/dental problems, or  Sore throat,                No sneezing, itching, ear ache, nasal congestion, post nasal drip,   CV:  No chest pain,  Orthopnea, PND, swelling in lower extremities, anasarca, dizziness, palpitations, syncope.   GI  No heartburn, indigestion, abdominal pain, nausea, vomiting, diarrhea, change in bowel habits, loss of appetite, bloody stools.   Resp: .  No chest wall deformity  Skin: no rash or lesions.  GU: no dysuria, change in color of urine, no urgency or frequency.  No flank pain, no hematuria   MS:  No joint pain or swelling.  No decreased range of motion.  No back pain.    Physical Exam  BP (!) 100/58 (BP Location: Left Arm, Patient Position: Sitting, Cuff Size: Large)    Pulse 80    Temp 97.9 F (36.6 C) (Oral)    Ht 5\' 3"  (1.6 m)    Wt 190 lb (86.2 kg)    SpO2 90%    BMI 33.66 kg/m   GEN: A/Ox3; pleasant , NAD, well nourished    HEENT:  [place]/AT,   NOSE-clear, THROAT-clear, no lesions, no postnasal drip or exudate noted.   NECK:  Supple w/ fair ROM; no JVD; normal carotid impulses w/o bruits; no thyromegaly or nodules palpated; no lymphadenopathy.  ,  Neck brace  RESP  Clear  P & A; w/o, wheezes/ rales/ or rhonchi. no accessory muscle use, no dullness to percussion  CARD:  RRR, no m/r/g, tr  peripheral edema, pulses intact, no cyanosis or clubbing.  GI:   Soft & nt; nml bowel sounds; no organomegaly or masses detected.  Musco: Warm bil, no deformities or joint swelling noted.   Neuro:  alert, no focal deficits noted.    Skin: Warm, no lesions or rashes    Lab Results:    BMET   BNP   ProBNP   Imaging: DG Chest 2 View  Result Date: 02/15/2021 CLINICAL DATA:  76 year old female with cough EXAM: CHEST - 2 VIEW COMPARISON:  02/06/2021 FINDINGS: Cardiomediastinal silhouette unchanged in size. Tortuosity of the descending thoracic aorta, likely accentuated given the kyphotic positioning. Reticular opacities throughout the lungs. Stigmata of emphysema, with increased retrosternal airspace, flattened hemidiaphragms, increased AP diameter, and hyperinflation on the AP view. No pneumothorax. No pleural effusion. Reticulonodular opacities at the posterior lung base on the lateral view correspond to findings on recent chest CT. IMPRESSION: Reticulonodular opacity at the posterior lung base, corresponding to findings on prior CT, concerning for multifocal infection, potentially aspiration related given the location. Emphysema and chronic lung changes. Electronically Signed   By: Corrie Mckusick D.O.   On: 02/15/2021 16:45   CT Chest W Contrast  Result Date: 02/06/2021 CLINICAL DATA:  76 year old female with shortness of breath today. EXAM: CT CHEST WITH CONTRAST TECHNIQUE: Multidetector CT imaging of the chest was performed during intravenous contrast administration. RADIATION DOSE REDUCTION: This exam was performed according to the departmental dose-optimization program which includes automated exposure control, adjustment of the mA and/or kV according to patient size and/or use of iterative reconstruction technique. CONTRAST:  160mL OMNIPAQUE IOHEXOL 300 MG/ML  SOLN COMPARISON:  03/02/2020 and prior chest CTs. 02/06/2021 and prior chest radiographs. 08/25/2020 thoracic spine MR FINDINGS: Cardiovascular: Cardiomegaly again identified. Heavy coronary artery and aortic atherosclerotic calcifications are present. No thoracic aortic aneurysm or pericardial effusion identified.  Mediastinum/Nodes: No enlarged mediastinal, hilar, or axillary lymph nodes. Thyroid gland, trachea, and esophagus demonstrate no significant findings. Lungs/Pleura: Moderate to severe emphysema again identified. Mild ground-glass opacities and interstitial opacities within both lungs are not significantly changed, nonspecific. A 1.5 cm nodular opacity is identified within the LEFT LOWER lobe (series 4: Image 17) but there is increasing LEFT basilar atelectasis in this region since the prior study as well. There is no evidence of pulmonary mass, pneumothorax or pleural effusion. Upper Abdomen: No acute abnormality. Musculoskeletal: No acute abnormalities. T11 and T12 compression fractures do not appear significantly changed from 08/25/2020 MR IMPRESSION: 1. 1.5 cm nodular opacity within the LEFT LOWER lobe with increasing LEFT basilar atelectasis in this region since the prior study. This probably represents an area of atelectasis but short-term chest CT follow-up is recommended (3 months). 2. Unchanged mild ground-glass opacities and interstitial opacities within both lungs, nonspecific but may represent edema. 3. Cardiomegaly and coronary artery disease. 4. Unchanged T11 and T12 compression fractures. 5. Aortic Atherosclerosis (ICD10-I70.0) and Emphysema (ICD10-J43.9). Electronically Signed   By: Margarette Canada M.D.   On: 02/06/2021 12:53   CT CERVICAL SPINE WO CONTRAST  Result Date: 01/26/2021 CLINICAL DATA:  Cervalgia. EXAM: CT CERVICAL SPINE WITHOUT CONTRAST TECHNIQUE: Multidetector CT imaging of the cervical spine was performed without intravenous contrast. Multiplanar CT image reconstructions were also generated. RADIATION DOSE REDUCTION: This exam was performed according to the departmental dose-optimization program which includes automated exposure control, adjustment of the mA and/or kV according to patient size and/or use of iterative reconstruction technique. COMPARISON:  Cervical spine MRI 08/25/2020.  CT chest 03/02/2020. FINDINGS: Alignment: Again seen is reversal of normal cervical lordosis. There is stable 2 mm of anterolisthesis at C3-C4 and C4-C5. Spinal alignment is otherwise within normal  limits. Skull base and vertebrae: There is no evidence for acute fracture. Posterior fusion hardware is again seen at L2 and L4. No evidence for hardware loosening. Soft tissues and spinal canal: No prevertebral fluid or swelling. No visible canal hematoma. Disc levels: There is stable mild to moderate intervertebral disc space narrowing throughout the cervical spine. Endplate osteophytes are seen at C4-C5, C5-C6, C6-C7 and C7-T1. Facet arthropathy is noted bilaterally at C2-C4. Bilateral neural foraminal stenosis at C3-C4 and C4-C5, left greater than right. No significant central canal stenosis identified. Upper chest: Negative. Other: None. IMPRESSION: 1. No acute bony abnormality. 2. Stable degenerative changes and mild anterolisthesis at C3-C4 and C4-C5. 3. Stable reversal of normal cervical lordosis. 4. Uncomplicated posterior fusion changes at C2-C4. Electronically Signed   By: Ronney Asters M.D.   On: 01/26/2021 20:50   CARDIAC CATHETERIZATION  Result Date: 02/08/2021   Prox LAD lesion is 80% stenosed.   Mid LAD lesion is 70% stenosed.   2nd Mrg lesion is 70% stenosed.   Ost RCA to Prox RCA lesion is 20% stenosed.   Prox RCA to Mid RCA lesion is 60% stenosed.   Dist RCA lesion is 85% stenosed.   LV end diastolic pressure is moderately elevated. 3 vessel obstructive CAD. Patient's arteries are severely calcified and tortuous. There is a 80% stenosis in the LAD just proximal to the first septal perforator and a segemental 70% lesion in the mid vessel. There is a 70% stenosis of the second OM. The stent in the proximal RCA is patent but there is a 60% stenosis in the mid vessel and an 85% eccentric calcified nodular lesion in the distal RCA Moderately elevated LVEDP 29 mm Hg. Plan: patient is not a candidate for  CABG given age and multiple co-morbidities including significant debility. PCI would be very difficult given severe calcification and tortuosity of vessels. I would recommend aggressive medical therapy especially since she denies any angina. Will add bisoprolol 5 mg daily and Plavix 75 mg daily for ACS indication.   DG Chest Portable 1 View  Result Date: 02/06/2021 CLINICAL DATA:  Shortness of breath. EXAM: PORTABLE CHEST 1 VIEW COMPARISON:  Chest radiograph 08/25/2020. FINDINGS: Monitoring leads overlie the patient. Stable cardiomegaly. Tortuosity and calcification of the thoracic aorta. Emphysematous change. Interval development of diffuse bilateral interstitial opacities. No pleural effusion or pneumothorax. IMPRESSION: Diffuse bilateral interstitial opacities may represent multifocal infection or pulmonary edema. Electronically Signed   By: Lovey Newcomer M.D.   On: 02/06/2021 09:56   ECHOCARDIOGRAM COMPLETE  Result Date: 02/07/2021    ECHOCARDIOGRAM REPORT   Patient Name:   MARILUZ CRESPO [facility] Date of Exam: 02/07/2021 Medical Rec #:  [id number]        Height:       63.0 in Accession #:    [id number]       Weight:       183.9 lb Date of Birth:  05-Aug-1945        BSA:          1.866 m Patient Age:    64 years         BP:           123/67 mmHg Patient Gender: F                HR:           69 bpm. Exam Location:  Inpatient Procedure: 2D Echo, Cardiac Doppler and Color Doppler STAT ECHO Indications:    Chest Pain  R07.9                 Elevated Troponin  History:        Patient has prior history of Echocardiogram examinations.                 Previous Myocardial Infarction, Acute MI and CAD; Risk                 Factors:Hypertension. GERD. Alcoholism.  Sonographer:    Merrie Roof RDCS Referring Phys: [place]  1. Septal apical and inferior basal hypokinesis . Left ventricular ejection fraction, by estimation, is 40 to 45%. The left ventricle has mildly decreased function. The left ventricle  demonstrates regional wall motion abnormalities (see scoring diagram/findings for description). The left ventricular internal cavity size was mildly dilated. There is mild left ventricular hypertrophy. Left ventricular diastolic parameters were normal.  2. Right ventricular systolic function is normal. The right ventricular size is normal. There is mildly elevated pulmonary artery systolic pressure.  3. Left atrial size was moderately dilated.  4. The mitral valve is abnormal. Mild mitral valve regurgitation. No evidence of mitral stenosis. Moderate mitral annular calcification.  5. Calcified non coronary cusp. The aortic valve is tricuspid. There is mild calcification of the aortic valve. There is mild thickening of the aortic valve. Aortic valve regurgitation is not visualized. Aortic valve sclerosis is present, with no evidence of aortic valve stenosis.  6. The inferior vena cava is normal in size with greater than 50% respiratory variability, suggesting right atrial pressure of 3 mmHg. FINDINGS  Left Ventricle: Septal apical and inferior basal hypokinesis. Left ventricular ejection fraction, by estimation, is 40 to 45%. The left ventricle has mildly decreased function. The left ventricle demonstrates regional wall motion abnormalities. The left  ventricular internal cavity size was mildly dilated. There is mild left ventricular hypertrophy. Left ventricular diastolic parameters were normal. Right Ventricle: The right ventricular size is normal. No increase in right ventricular wall thickness. Right ventricular systolic function is normal. There is mildly elevated pulmonary artery systolic pressure. The tricuspid regurgitant velocity is 2.98  m/s, and with an assumed right atrial pressure of 3 mmHg, the estimated right ventricular systolic pressure is 51.7 mmHg. Left Atrium: Left atrial size was moderately dilated. Right Atrium: Right atrial size was normal in size. Pericardium: There is no evidence of  pericardial effusion. Mitral Valve: The mitral valve is abnormal. There is mild thickening of the mitral valve leaflet(s). There is mild calcification of the mitral valve leaflet(s). Moderate mitral annular calcification. Mild mitral valve regurgitation. No evidence of mitral  valve stenosis. Tricuspid Valve: The tricuspid valve is normal in structure. Tricuspid valve regurgitation is mild . No evidence of tricuspid stenosis. Aortic Valve: Calcified non coronary cusp. The aortic valve is tricuspid. There is mild calcification of the aortic valve. There is mild thickening of the aortic valve. Aortic valve regurgitation is not visualized. Aortic valve sclerosis is present, with  no evidence of aortic valve stenosis. Aortic valve mean gradient measures 6.0 mmHg. Aortic valve peak gradient measures 11.2 mmHg. Aortic valve area, by VTI measures 2.79 cm. Pulmonic Valve: The pulmonic valve was normal in structure. Pulmonic valve regurgitation is not visualized. No evidence of pulmonic stenosis. Aorta: The aortic root is normal in size and structure. Venous: The inferior vena cava is normal in size with greater than 50% respiratory variability, suggesting right atrial pressure of 3 mmHg. IAS/Shunts: No atrial level shunt detected by color flow  Doppler.  LEFT VENTRICLE PLAX 2D LVIDd:         5.00 cm   Diastology LVIDs:         3.60 cm   LV e' medial:    5.98 cm/s LV PW:         1.20 cm   LV E/e' medial:  13.1 LV IVS:        1.30 cm   LV e' lateral:   9.25 cm/s LVOT diam:     2.20 cm   LV E/e' lateral: 8.5 LV SV:         93 LV SV Index:   50 LVOT Area:     3.80 cm  RIGHT VENTRICLE          IVC RV Basal diam:  3.70 cm  IVC diam: 2.30 cm LEFT ATRIUM           Index        RIGHT ATRIUM           Index LA diam:      4.40 cm 2.36 cm/m   RA Area:     20.70 cm LA Vol (A2C): 64.5 ml 34.57 ml/m  RA Volume:   60.80 ml  32.59 ml/m LA Vol (A4C): 68.7 ml 36.82 ml/m  AORTIC VALVE AV Area (Vmax):    2.87 cm AV Area (Vmean):   2.78  cm AV Area (VTI):     2.79 cm AV Vmax:           167.00 cm/s AV Vmean:          112.000 cm/s AV VTI:            0.333 m AV Peak Grad:      11.2 mmHg AV Mean Grad:      6.0 mmHg LVOT Vmax:         126.00 cm/s LVOT Vmean:        81.800 cm/s LVOT VTI:          0.244 m LVOT/AV VTI ratio: 0.73  AORTA Ao Asc diam: 3.40 cm MITRAL VALVE               TRICUSPID VALVE MV Area (PHT): 3.21 cm    TR Peak grad:   35.5 mmHg MV Decel Time: 236 msec    TR Vmax:        298.00 cm/s MV E velocity: 78.60 cm/s MV A velocity: 76.00 cm/s  SHUNTS MV E/A ratio:  1.03        Systemic VTI:  0.24 m                            Systemic Diam: 2.20 cm Jenkins Rouge MD Electronically signed by Jenkins Rouge MD Signature Date/Time: 02/07/2021/11:45:27 AM    Final       PFT Results Latest Ref Rng & Units 07/02/2019  FVC-Pre L 2.74  FVC-Predicted Pre % 100  FVC-Post L 2.71  FVC-Predicted Post % 99  Pre FEV1/FVC % % 68  Post FEV1/FCV % % 69  FEV1-Pre L 1.86  FEV1-Predicted Pre % 91  FEV1-Post L 1.87  DLCO uncorrected ml/min/mmHg 9.87  DLCO UNC% % 53  DLCO corrected ml/min/mmHg 9.87  DLCO COR %Predicted % 53  DLVA Predicted % 55  TLC L 4.52  TLC % Predicted % 92  RV % Predicted % 84    No results found for: NITRICOXIDE      Assessment &  Plan:   COPD (chronic obstructive pulmonary disease) ([place]) COPD exacerbation slowly resolving.  Patient has a lingering congestion.  We will treat with some empiric antibiotics and slow steroid taper. Patient is advised if symptoms do not improve or worsen she is to contact us sooner or go to the emergency room.  Patient is on maximum therapy with triple therapy inhaler, nebulizers and steroids.  Along with oxygen  Plan  Patient Instructions  Augmentin 875mg  Twice daily  for 1 week , take with food.  Mucinex DM Twice daily  As needed  cough/congestion  Fluids and rest  Albuterol inhaler .  Extra Lasix 20mg  daily for next 2 days .  Prednisone taper over next week.  Continue on  Oxygen 2l/m . Continue on BREZTRI 2 puffs Twice daily   Albuterol inhaler or neb every 6hrs as needed .  CT chest in 3 months (left lower lung nodule )  Follow up with in 1 week with Dr. Valeta Harms or Eduardo Honor NP and As needed   Please contact office for sooner follow up if symptoms do not improve or worsen or seek emergency care         CHF exacerbation [facility]) Recent hospitalization with decompensation and non-STEMI. -Patient has significantly elevated BNP during hospitalization and a decrease in her EF on echo.  Weight is up 10 pounds over the last 4 to 6 weeks. We will give some gentle diuresis with Lasix over the next 2 days. Patient is to follow-up with cardiology as planned  Chronic respiratory failure with hypoxia [facility]) Patient is continue on oxygen at 2 L to maintain O2 saturations greater than 88 to 90%.  Patient is advised if oxygen levels are dropping she is to contact us mainly or go to the emergency room.  Lung nodule Left lower lobe lung nodule 1.5 cm will need serial follow-up in 3 months with CT chest   I spent 59minutes dedicated to the care of this patient on the date of this encounter to include pre-visit review of records, face-to-face time with the patient discussing conditions above, post visit ordering of testing, clinical documentation with the electronic health record, making appropriate referrals as documented, and communicating necessary findings to members of the patients care team.    [name], NP 02/19/2021

## 2021-02-19 NOTE — Assessment & Plan Note (Signed)
Left lower lobe lung nodule 1.5 cm will need serial follow-up in 3 months with CT chest

## 2021-02-19 NOTE — Assessment & Plan Note (Signed)
Recent hospitalization with decompensation and non-STEMI. -Patient has significantly elevated BNP during hospitalization and a decrease in her EF on echo.  Weight is up 10 pounds over the last 4 to 6 weeks. We will give some gentle diuresis with Lasix over the next 2 days. Patient is to follow-up with cardiology as planned

## 2021-02-19 NOTE — Assessment & Plan Note (Signed)
COPD exacerbation slowly resolving.  Patient has a lingering congestion.  We will treat with some empiric antibiotics and slow steroid taper. Patient is advised if symptoms do not improve or worsen she is to contact us sooner or go to the emergency room.  Patient is on maximum therapy with triple therapy inhaler, nebulizers and steroids.  Along with oxygen  Plan  Patient Instructions  Augmentin 875mg  Twice daily  for 1 week , take with food.  Mucinex DM Twice daily  As needed  cough/congestion  Fluids and rest  Albuterol inhaler .  Extra Lasix 20mg  daily for next 2 days .  Prednisone taper over next week.  Continue on Oxygen 2l/m . Continue on BREZTRI 2 puffs Twice daily   Albuterol inhaler or neb every 6hrs as needed .  CT chest in 3 months (left lower lung nodule )  Follow up with in 1 week with Dr. Valeta Harms or Rocky Rishel NP and As needed   Please contact office for sooner follow up if symptoms do not improve or worsen or seek emergency care

## 2021-02-22 ENCOUNTER — Other Ambulatory Visit: Payer: Self-pay | Admitting: *Deleted

## 2021-02-22 MED ORDER — BREZTRI AEROSPHERE 160-9-4.8 MCG/ACT IN AERO: 2.0000 | INHALATION_SPRAY | Freq: Two times a day (BID) | RESPIRATORY_TRACT | 5 refills | Status: DC

## 2021-02-23 ENCOUNTER — Other Ambulatory Visit: Payer: Self-pay | Admitting: Cardiology

## 2021-02-24 DIAGNOSIS — Z20822 Contact with and (suspected) exposure to covid-19: Secondary | ICD-10-CM | POA: Diagnosis not present

## 2021-02-24 DIAGNOSIS — E875 Hyperkalemia: Secondary | ICD-10-CM | POA: Diagnosis not present

## 2021-02-24 LAB — BASIC METABOLIC PANEL
BUN/Creatinine Ratio: 19 (ref 12–28)
BUN: 18 mg/dL (ref 8–27)
CO2: 23 mmol/L (ref 20–29)
Calcium: 10.8 mg/dL — ABNORMAL HIGH (ref 8.7–10.3)
Chloride: 97 mmol/L (ref 96–106)
Creatinine, Ser: 0.94 mg/dL (ref 0.57–1.00)
Glucose: 122 mg/dL — ABNORMAL HIGH (ref 70–99)
Potassium: 4.7 mmol/L (ref 3.5–5.2)
Sodium: 137 mmol/L (ref 134–144)
eGFR: 63 mL/min/{1.73_m2} (ref >59)

## 2021-02-25 DIAGNOSIS — I110 Hypertensive heart disease with heart failure: Secondary | ICD-10-CM | POA: Diagnosis not present

## 2021-02-25 DIAGNOSIS — J439 Emphysema, unspecified: Secondary | ICD-10-CM | POA: Diagnosis not present

## 2021-02-25 DIAGNOSIS — I5043 Acute on chronic combined systolic (congestive) and diastolic (congestive) heart failure: Secondary | ICD-10-CM | POA: Diagnosis not present

## 2021-02-25 DIAGNOSIS — I252 Old myocardial infarction: Secondary | ICD-10-CM | POA: Diagnosis not present

## 2021-02-25 DIAGNOSIS — J9621 Acute and chronic respiratory failure with hypoxia: Secondary | ICD-10-CM | POA: Diagnosis not present

## 2021-02-25 DIAGNOSIS — I2510 Atherosclerotic heart disease of native coronary artery without angina pectoris: Secondary | ICD-10-CM | POA: Diagnosis not present

## 2021-02-26 ENCOUNTER — Telehealth: Payer: Self-pay | Admitting: Cardiology

## 2021-02-26 DIAGNOSIS — I739 Peripheral vascular disease, unspecified: Secondary | ICD-10-CM | POA: Diagnosis not present

## 2021-02-26 DIAGNOSIS — F319 Bipolar disorder, unspecified: Secondary | ICD-10-CM | POA: Diagnosis not present

## 2021-02-26 DIAGNOSIS — J449 Chronic obstructive pulmonary disease, unspecified: Secondary | ICD-10-CM | POA: Diagnosis not present

## 2021-02-26 DIAGNOSIS — E785 Hyperlipidemia, unspecified: Secondary | ICD-10-CM | POA: Diagnosis not present

## 2021-02-26 DIAGNOSIS — I2510 Atherosclerotic heart disease of native coronary artery without angina pectoris: Secondary | ICD-10-CM | POA: Diagnosis not present

## 2021-02-26 DIAGNOSIS — R7303 Prediabetes: Secondary | ICD-10-CM | POA: Diagnosis not present

## 2021-02-26 DIAGNOSIS — I10 Essential (primary) hypertension: Secondary | ICD-10-CM | POA: Diagnosis not present

## 2021-02-26 DIAGNOSIS — I5042 Chronic combined systolic (congestive) and diastolic (congestive) heart failure: Secondary | ICD-10-CM | POA: Diagnosis not present

## 2021-02-26 DIAGNOSIS — D509 Iron deficiency anemia, unspecified: Secondary | ICD-10-CM | POA: Diagnosis not present

## 2021-02-26 NOTE — Telephone Encounter (Signed)
Spoke to patient she stated her B/P has been ranging 138/66,122/72,165/78,144/68,141/70,167/79. Cailtlin's advice given.She will increase Entresto to 24/26 mg 2 tablets twice a day until she receives Entresto 49/51 mg.Then she will take 49/51 mg twice a day.Advised to continue to monitor B/P and call back if too low.I will make Laurann Montana NP aware.

## 2021-02-26 NOTE — Telephone Encounter (Signed)
Planned to start with 24/26mg  BID dose with free 30 day coupon. Allowed [place] to ensure she tolerated. Anticipated we would be able to up-titrate to 49/51 mg BID so since we knew [facility] would take some time to get meds in, went ahead and sent stronger dose.   If she has any recent blood pressure readings, those will be helpful.   If not checking BP, recommend checking once per day at least 2 hours after meds and calling us with an update in 1 week. If BP consistently <130/80, we will plan to continue the 24/26 mg BID dosing and I can update the [facility] If BP consistently >130/80, may go ahead and increase dose by taking two 24/26mg  tablets BID until picking up 49/51mg  BID dosing at [facility]. We also have samples of the 49/51mg  dose if needed at the [facility] office which I can leave at the front desk for her if there is delay in picking up from [facility].   Best,  Loel Dubonnet, NP

## 2021-02-26 NOTE — Telephone Encounter (Signed)
Pt c/o medication issue:  1. Name of Medication:   sacubitril-valsartan (ENTRESTO) 24-26 MG  sacubitril-valsartan (ENTRESTO) 49-51 MG  2. How are you currently taking this medication (dosage and times per day)? Take 1 tablet by mouth 2 (two) times daily.  3. Are you having a reaction (difficulty breathing--STAT)? no  4. What is your medication issue? Pt has two occurrences of this medication on her chart with two different doses... please advise which one is correct

## 2021-02-26 NOTE — Telephone Encounter (Signed)
Spoke to patient she stated she has been taking Entresto 24/26 mg twice a day since she saw Laurann Montana NP 02/16/21.Stated she had a refill of 49/51 mg sent to [facility].Stated they are out of 49/51 mg and will not receive until 2 to 3 weeks.She is confused on what mg she needs to be taking.Advised I will send message to Laurann Montana NP for advice.

## 2021-03-01 ENCOUNTER — Ambulatory Visit (INDEPENDENT_AMBULATORY_CARE_PROVIDER_SITE_OTHER): Payer: Medicare Other | Admitting: Adult Health

## 2021-03-01 ENCOUNTER — Other Ambulatory Visit: Payer: Self-pay

## 2021-03-01 ENCOUNTER — Encounter: Payer: Self-pay | Admitting: Adult Health

## 2021-03-01 DIAGNOSIS — I5043 Acute on chronic combined systolic (congestive) and diastolic (congestive) heart failure: Secondary | ICD-10-CM | POA: Diagnosis not present

## 2021-03-01 DIAGNOSIS — R911 Solitary pulmonary nodule: Secondary | ICD-10-CM | POA: Diagnosis not present

## 2021-03-01 DIAGNOSIS — I110 Hypertensive heart disease with heart failure: Secondary | ICD-10-CM | POA: Diagnosis not present

## 2021-03-01 DIAGNOSIS — J189 Pneumonia, unspecified organism: Secondary | ICD-10-CM

## 2021-03-01 DIAGNOSIS — J9621 Acute and chronic respiratory failure with hypoxia: Secondary | ICD-10-CM | POA: Diagnosis not present

## 2021-03-01 DIAGNOSIS — J9611 Chronic respiratory failure with hypoxia: Secondary | ICD-10-CM | POA: Diagnosis not present

## 2021-03-01 DIAGNOSIS — J439 Emphysema, unspecified: Secondary | ICD-10-CM | POA: Diagnosis not present

## 2021-03-01 DIAGNOSIS — I252 Old myocardial infarction: Secondary | ICD-10-CM | POA: Diagnosis not present

## 2021-03-01 DIAGNOSIS — I5032 Chronic diastolic (congestive) heart failure: Secondary | ICD-10-CM | POA: Diagnosis not present

## 2021-03-01 DIAGNOSIS — I2510 Atherosclerotic heart disease of native coronary artery without angina pectoris: Secondary | ICD-10-CM

## 2021-03-01 HISTORY — DX: Pneumonia, unspecified organism: J18.9

## 2021-03-01 NOTE — Telephone Encounter (Signed)
Agree with plan. Thank you!   Loel Dubonnet, NP

## 2021-03-01 NOTE — Addendum Note (Signed)
Addended by: Lorretta Harp on: 03/01/2021 12:29 PM   Modules accepted: Orders

## 2021-03-01 NOTE — Patient Instructions (Signed)
Mucinex DM Twice daily  As needed  cough/congestion  Fluids and rest  Continue on Oxygen 2l/m . Continue on BREZTRI 2 puffs Twice daily   Albuterol inhaler or neb every 6hrs as needed .  CT chest in 3 months (left lower lung nodule ) (April 2023)  Follow up with in 6 week with Dr. Valeta Harms or Morghan Kester NP with chest xray and As needed   Please contact office for sooner follow up if symptoms do not improve or worsen or seek emergency care

## 2021-03-01 NOTE — Progress Notes (Signed)
@Patient  ID: [name], female    DOB: 09-21-1945, 76 y.o.   MRN: [id number]  Chief Complaint  Patient presents with   Follow-up    Referring provider: Harlan Stains, MD  HPI: 76 year old female former smoker followed for severe COPD with emphysema and oxygen dependent respiratory failure Participates in the low-dose CT screening program Medical history significant for coronary artery disease, hypertension, congestive heart failure, bipolar disorder  TEST/EVENTS :  CT chest with contrast on February 06, 2021 showed moderate to severe emphysema, mild groundglass opacities in both lungs.  And a 1.5 nodular opacity in the left lower lobe along with left basilar atelectasis  2D echo February 07, 2021 septal apical and inferior basal hypokinesis, EF 40 to 45%, mildly elevated pulmonary artery systolic pressure.  PFT June 2021 FEV1 91%, ratio 69, FVC 99%, no significant bronchodilator response, DLCO 53%.  03/01/2021 Follow up ; COPD , PNA  Patient returns for a 1 week follow-up.  Patient was seen last visit for a posthospitalization follow-up.  She had been admitted late January for acute on chronic hypoxic respiratory failure with COPD exacerbation, pneumonia and decompensated heart failure and non-STEMI.  She was treated with antibiotics, steroids and nebulized bronchodilators.  She did require diuresis.  She was also started on Entresto.  2D echo during hospitalization did show decreased EF at 40 to 45%.  Last visit patient continued to have ongoing COPD exacerbation symptoms with cough and congestion.  She was treated with empiric antibiotics and a slow prednisone taper.  She was started on Augmentin for 1 week and given a prednisone taper.  She was also given an extra dose of Lasix for 2 days. She was set up for a CT chest to follow a 1.5cm  left lower lobe nodule. Patient remains on Breztri inhaler twice daily.  And she remains on oxygen 2 L.  Since last visit patient says she is  feeling better with decreased cough and congestion.  Says stamina still low and energy level is low. Starting home PT today.  Appetite is good. No n/v/d. No leg swelling .      Allergies  Allergen Reactions   Cephalexin     Other reaction(s): diarrhea   Doxycycline Diarrhea   Gabapentin Other (See Comments)    sleepiness   Morphine And Related Anxiety    Was confused when taking it    Immunization History  Administered Date(s) Administered   Fluad Quad(high Dose 65+) 11/01/2018   Influenza Split 01/18/2008, 11/10/2009   Influenza, High Dose Seasonal PF 09/30/2011   Influenza,inj,quad, With Preservative 10/17/2016   Influenza-Unspecified 12/07/2010, 09/17/2013, 11/16/2020   Moderna Sars-Covid-2 Vaccination 03/01/2019, 03/29/2019, 12/02/2019   OPV 08/24/2020   Pneumococcal Conjugate-13 08/23/2018   Pneumococcal Polysaccharide-23 04/12/2010   Pneumococcal-Unspecified 01/18/2008, 04/12/2010   Tdap 04/25/2011   Zoster Recombinat (Shingrix) 08/06/2017   Zoster, Live 04/28/2010, 08/06/2017    Past Medical History:  Diagnosis Date   (HFpEF) heart failure with preserved ejection fraction ([place]) 02/24/2017   Echo 05/2020: EF 55, severe inferior HK, mild LVH, Gr1 DD, normal RVSF, RVSP 32.4, mild BAE, mild MR, AV sclerosis without stenosis   Alcoholism ([place])    recovering since 2000   Anxiety and depression    Arthritis BACK   Bipolar disorder (HCC)    Blepharospasm LEFT EYE   Chronic back pain    COPD (chronic obstructive pulmonary disease) (HCC)    Coronary artery disease    s/p Inf MI in 2000 tx with PCI  to RCA // Myoview in 2019 with inf-lat scar and mild peri-infarct ischemia   Emphysema    GERD (gastroesophageal reflux disease)    History of alcohol abuse RECOVERING SINCE 2000   History of Left renal mass 03/02/2012   underwent partial nephrectomy   HTN (hypertension)    Idiopathic acute facial nerve palsy LEFT SIDE--  BOTOX THERAPY   Inferior MI ([place]) 2000--  POST  PTCA W/ STENT X1   Ischemic cardiomyopathy    EF returned to normal   Osteoporosis    Other and unspecified general anesthetics causing adverse effect in therapeutic use post op delirium--  last anes record w/ chart  from   09-22-2009 (spinal w/ light sedation)   Peripheral vascular disease ([place]) POST RIGHT CAROTID SURG.  1995   Renal cell carcinoma ([place]) 07/09/2012   Left mass   Rosacea LEFT FACIAL RASH   S/P radiation therapy 02/21/2012   38.75 Gy HDR 5 Fractions- vaginal cuff   Scoliosis    Seizures (HCC)    x 1 after abrupt discontinuation of  Clonidine   Status post carotid endarterectomy RIGHT --  1995   Status post primary angioplasty with coronary stent 2000--  POST INFERIOR MI   Unstable balance WALKS W/ CANE   Vaginal cancer ([place])     Tobacco History: Social History   Tobacco Use  Smoking Status Former   Packs/day: 2.00   Years: 50.00   Pack years: 100.00   Types: Cigarettes   Quit date: 01/17/2010   Years since quitting: 11.1  Smokeless Tobacco Never  Tobacco Comments   STATES QUIT SMOKING 01-17-2010   Counseling given: Not Answered Tobacco comments: STATES QUIT SMOKING 01-17-2010   Outpatient Medications Prior to Visit  Medication Sig Dispense Refill   acetaminophen (TYLENOL) 325 MG tablet Take 2 tablets (650 mg total) by mouth every 4 (four) hours as needed for mild pain ((score 1 to 3) or temp > 100.5).     albuterol (PROVENTIL) (2.5 MG/3ML) 0.083% nebulizer solution Take 3 mLs (2.5 mg total) by nebulization every 6 (six) hours as needed for wheezing or shortness of breath. 75 mL 12   albuterol (VENTOLIN HFA) 108 (90 Base) MCG/ACT inhaler Inhale 2 puffs into the lungs every 6 (six) hours as needed for wheezing. 18 g 1   ARIPiprazole (ABILIFY) 10 MG tablet Take 1 tablet (10 mg total) by mouth daily. 30 tablet 0   Ascorbic Acid (VITAMIN C) 1000 MG tablet Take 1,000 mg by mouth daily.     aspirin EC 81 MG tablet Take 81 mg by mouth every morning.     bisoprolol  (ZEBETA) 5 MG tablet Take 0.5 tablets (2.5 mg total) by mouth daily. 15 tablet 0   Budeson-Glycopyrrol-Formoterol (BREZTRI AEROSPHERE) 160-9-4.8 MCG/ACT AERO Inhale 2 puffs into the lungs in the morning and at bedtime. 10.7 g 5   CADUET 10-20 MG tablet TAKE 1 TABLET BY MOUTH DAILY. 90 tablet 0   Calcium Carb-Cholecalciferol 600-400 MG-UNIT TABS Take 1 tablet by mouth daily. 60 tablet 0   celecoxib (CELEBREX) 200 MG capsule Take 400 mg by mouth daily.     Cholecalciferol (VITAMIN D3) 50 MCG (2000 UT) TABS Take 2,000 Units by mouth daily. 30 tablet 0   clopidogrel (PLAVIX) 75 MG tablet Take 1 tablet (75 mg total) by mouth daily. 30 tablet 0   denosumab (PROLIA) 60 MG/ML SOSY injection Inject 60 mg into the skin every 6 (six) months.     dexlansoprazole (DEXILANT) 60 MG  capsule Take 1 capsule (60 mg total) by mouth daily. 30 capsule 0   docusate sodium (COLACE) 100 MG capsule Take 100 mg by mouth daily as needed for mild constipation.     empagliflozin (JARDIANCE) 10 MG TABS tablet Take 1 tablet (10 mg total) by mouth daily. 90 tablet 3   ferrous sulfate 325 (65 FE) MG tablet Take 1 tablet (325 mg total) by mouth daily. 30 tablet 3   furosemide (LASIX) 20 MG tablet Take 1 tablet (20 mg total) by mouth daily. 30 tablet 3   hydrALAZINE (APRESOLINE) 25 MG tablet Take 1 tablet (25 mg total) by mouth 2 (two) times daily. 60 tablet 6   lamoTRIgine (LAMICTAL) 100 MG tablet Take 2 tablets (200 mg total) by mouth 2 (two) times daily. 120 tablet 0   montelukast (SINGULAIR) 10 MG tablet Take 1 tablet (10 mg total) by mouth at bedtime. TAKE 1 TABLET BY MOUTH EVERYDAY AT BEDTIME (Patient taking differently: Take 10 mg by mouth at bedtime.) 90 tablet 1   Multiple Vitamin (MULTIVITAMIN) capsule Take 1 capsule by mouth daily.      nitroGLYCERIN (NITROSTAT) 0.4 MG SL tablet Place 1 tablet (0.4 mg total) under the tongue every 5 (five) minutes as needed for chest pain. 25 tablet 3   Omega-3 Fatty Acids (FISH OIL) 1000  MG CAPS Take 1,000 mg by mouth 2 (two) times daily.     Oxycodone HCl 10 MG TABS Take 1 tablet (10 mg total) by mouth every 4 (four) hours as needed for severe pain. 30 tablet 0   Polyethyl Glycol-Propyl Glycol (SYSTANE) 0.4-0.3 % GEL ophthalmic gel Place 1 application into both eyes daily as needed (dry eyes).     potassium chloride (KLOR-CON) 8 MEQ tablet Take 1 tablet (8 mEq total) by mouth daily. 30 tablet 6   sacubitril-valsartan (ENTRESTO) 24-26 MG Take 1 tablet by mouth 2 (two) times daily. 60 tablet 0   sertraline (ZOLOFT) 50 MG tablet Take 1 tablet (50 mg total) by mouth daily. 30 tablet 0   tiZANidine (ZANAFLEX) 4 MG tablet Take 1 tablet (4 mg total) by mouth every 6 (six) hours. 120 tablet 0   predniSONE (DELTASONE) 10 MG tablet 4 tabs for 2 days, then 3 tabs for 2 days, 2 tabs for 2 days, then 1 tab for 2 days, then stop 20 tablet 0   predniSONE (DELTASONE) 20 MG tablet Take 2 tablets (40 mg total) by mouth daily with breakfast. 10 tablet 0   sacubitril-valsartan (ENTRESTO) 49-51 MG Take 1 tablet by mouth 2 (two) times daily. 60 tablet 5   No facility-administered medications prior to visit.     Review of Systems:   Constitutional:   No  weight loss, night sweats,  Fevers, chills,  +fatigue, or  lassitude.  HEENT:   No headaches,  Difficulty swallowing,  Tooth/dental problems, or  Sore throat,                No sneezing, itching, ear ache, nasal congestion, post nasal drip,   CV:  No chest pain,  Orthopnea, PND, swelling in lower extremities, anasarca, dizziness, palpitations, syncope.   GI  No heartburn, indigestion, abdominal pain, nausea, vomiting, diarrhea, change in bowel habits, loss of appetite, bloody stools.   Resp:  No chest wall deformity  Skin: no rash or lesions.  GU: no dysuria, change in color of urine, no urgency or frequency.  No flank pain, no hematuria   MS:  No joint pain or  swelling.  No decreased range of motion.  No back pain.    Physical  Exam  BP 126/74 (BP Location: Left Arm, Cuff Size: Normal)    Pulse 64    SpO2 91%   GEN: A/Ox3; pleasant , NAD, elderly , walker, O2    HEENT:  [place]/AT,  , NOSE-clear, THROAT-clear, no lesions, no postnasal drip or exudate noted. Neck support pillow   NECK:  Supple w/ fair ROM; no JVD; normal carotid impulses w/o bruits; no thyromegaly or nodules palpated; no lymphadenopathy.    RESP  few trace rhonchi  no accessory muscle use, no dullness to percussion  CARD:  RRR, no m/r/g, no peripheral edema, pulses intact, no cyanosis or clubbing.  GI:   Soft & nt; nml bowel sounds; no organomegaly or masses detected.   Musco: Warm bil, no deformities or joint swelling noted.   Neuro: alert, no focal deficits noted.    Skin: Warm, no lesions or rashes    Lab Results:  CBC   BMET   BNP    Imaging: DG Chest 2 View  Result Date: 02/15/2021 CLINICAL DATA:  76 year old female with cough EXAM: CHEST - 2 VIEW COMPARISON:  02/06/2021 FINDINGS: Cardiomediastinal silhouette unchanged in size. Tortuosity of the descending thoracic aorta, likely accentuated given the kyphotic positioning. Reticular opacities throughout the lungs. Stigmata of emphysema, with increased retrosternal airspace, flattened hemidiaphragms, increased AP diameter, and hyperinflation on the AP view. No pneumothorax. No pleural effusion. Reticulonodular opacities at the posterior lung base on the lateral view correspond to findings on recent chest CT. IMPRESSION: Reticulonodular opacity at the posterior lung base, corresponding to findings on prior CT, concerning for multifocal infection, potentially aspiration related given the location. Emphysema and chronic lung changes. Electronically Signed   By: Corrie Mckusick D.O.   On: 02/15/2021 16:45   CT Chest W Contrast  Result Date: 02/06/2021 CLINICAL DATA:  76 year old female with shortness of breath today. EXAM: CT CHEST WITH CONTRAST TECHNIQUE: Multidetector CT imaging of the  chest was performed during intravenous contrast administration. RADIATION DOSE REDUCTION: This exam was performed according to the departmental dose-optimization program which includes automated exposure control, adjustment of the mA and/or kV according to patient size and/or use of iterative reconstruction technique. CONTRAST:  154mL OMNIPAQUE IOHEXOL 300 MG/ML  SOLN COMPARISON:  03/02/2020 and prior chest CTs. 02/06/2021 and prior chest radiographs. 08/25/2020 thoracic spine MR FINDINGS: Cardiovascular: Cardiomegaly again identified. Heavy coronary artery and aortic atherosclerotic calcifications are present. No thoracic aortic aneurysm or pericardial effusion identified. Mediastinum/Nodes: No enlarged mediastinal, hilar, or axillary lymph nodes. Thyroid gland, trachea, and esophagus demonstrate no significant findings. Lungs/Pleura: Moderate to severe emphysema again identified. Mild ground-glass opacities and interstitial opacities within both lungs are not significantly changed, nonspecific. A 1.5 cm nodular opacity is identified within the LEFT LOWER lobe (series 4: Image 17) but there is increasing LEFT basilar atelectasis in this region since the prior study as well. There is no evidence of pulmonary mass, pneumothorax or pleural effusion. Upper Abdomen: No acute abnormality. Musculoskeletal: No acute abnormalities. T11 and T12 compression fractures do not appear significantly changed from 08/25/2020 MR IMPRESSION: 1. 1.5 cm nodular opacity within the LEFT LOWER lobe with increasing LEFT basilar atelectasis in this region since the prior study. This probably represents an area of atelectasis but short-term chest CT follow-up is recommended (3 months). 2. Unchanged mild ground-glass opacities and interstitial opacities within both lungs, nonspecific but may represent edema. 3. Cardiomegaly and coronary artery disease. 4. Unchanged  T11 and T12 compression fractures. 5. Aortic Atherosclerosis (ICD10-I70.0) and  Emphysema (ICD10-J43.9). Electronically Signed   By: Margarette Canada M.D.   On: 02/06/2021 12:53   CARDIAC CATHETERIZATION  Result Date: 02/08/2021   Prox LAD lesion is 80% stenosed.   Mid LAD lesion is 70% stenosed.   2nd Mrg lesion is 70% stenosed.   Ost RCA to Prox RCA lesion is 20% stenosed.   Prox RCA to Mid RCA lesion is 60% stenosed.   Dist RCA lesion is 85% stenosed.   LV end diastolic pressure is moderately elevated. 3 vessel obstructive CAD. Patient's arteries are severely calcified and tortuous. There is a 80% stenosis in the LAD just proximal to the first septal perforator and a segemental 70% lesion in the mid vessel. There is a 70% stenosis of the second OM. The stent in the proximal RCA is patent but there is a 60% stenosis in the mid vessel and an 85% eccentric calcified nodular lesion in the distal RCA Moderately elevated LVEDP 29 mm Hg. Plan: patient is not a candidate for CABG given age and multiple co-morbidities including significant debility. PCI would be very difficult given severe calcification and tortuosity of vessels. I would recommend aggressive medical therapy especially since she denies any angina. Will add bisoprolol 5 mg daily and Plavix 75 mg daily for ACS indication.   DG Chest Portable 1 View  Result Date: 02/06/2021 CLINICAL DATA:  Shortness of breath. EXAM: PORTABLE CHEST 1 VIEW COMPARISON:  Chest radiograph 08/25/2020. FINDINGS: Monitoring leads overlie the patient. Stable cardiomegaly. Tortuosity and calcification of the thoracic aorta. Emphysematous change. Interval development of diffuse bilateral interstitial opacities. No pleural effusion or pneumothorax. IMPRESSION: Diffuse bilateral interstitial opacities may represent multifocal infection or pulmonary edema. Electronically Signed   By: Lovey Newcomer M.D.   On: 02/06/2021 09:56   ECHOCARDIOGRAM COMPLETE  Result Date: 02/07/2021    ECHOCARDIOGRAM REPORT   Patient Name:   [name] [facility] Date of Exam: 02/07/2021  Medical Rec #:  [id number]        Height:       63.0 in Accession #:    [id number]       Weight:       183.9 lb Date of Birth:  1946/01/05        BSA:          1.866 m Patient Age:    85 years         BP:           123/67 mmHg Patient Gender: F                HR:           69 bpm. Exam Location:  Inpatient Procedure: 2D Echo, Cardiac Doppler and Color Doppler STAT ECHO Indications:    Chest Pain R07.9                 Elevated Troponin  History:        Patient has prior history of Echocardiogram examinations.                 Previous Myocardial Infarction, Acute MI and CAD; Risk                 Factors:Hypertension. GERD. Alcoholism.  Sonographer:    Merrie Roof RDCS Referring Phys: [place]  1. Septal apical and inferior basal hypokinesis . Left ventricular ejection fraction, by estimation, is 40 to 45%. The left ventricle  has mildly decreased function. The left ventricle demonstrates regional wall motion abnormalities (see scoring diagram/findings for description). The left ventricular internal cavity size was mildly dilated. There is mild left ventricular hypertrophy. Left ventricular diastolic parameters were normal.  2. Right ventricular systolic function is normal. The right ventricular size is normal. There is mildly elevated pulmonary artery systolic pressure.  3. Left atrial size was moderately dilated.  4. The mitral valve is abnormal. Mild mitral valve regurgitation. No evidence of mitral stenosis. Moderate mitral annular calcification.  5. Calcified non coronary cusp. The aortic valve is tricuspid. There is mild calcification of the aortic valve. There is mild thickening of the aortic valve. Aortic valve regurgitation is not visualized. Aortic valve sclerosis is present, with no evidence of aortic valve stenosis.  6. The inferior vena cava is normal in size with greater than 50% respiratory variability, suggesting right atrial pressure of 3 mmHg. FINDINGS  Left Ventricle: Septal apical  and inferior basal hypokinesis. Left ventricular ejection fraction, by estimation, is 40 to 45%. The left ventricle has mildly decreased function. The left ventricle demonstrates regional wall motion abnormalities. The left  ventricular internal cavity size was mildly dilated. There is mild left ventricular hypertrophy. Left ventricular diastolic parameters were normal. Right Ventricle: The right ventricular size is normal. No increase in right ventricular wall thickness. Right ventricular systolic function is normal. There is mildly elevated pulmonary artery systolic pressure. The tricuspid regurgitant velocity is 2.98  m/s, and with an assumed right atrial pressure of 3 mmHg, the estimated right ventricular systolic pressure is 49.8 mmHg. Left Atrium: Left atrial size was moderately dilated. Right Atrium: Right atrial size was normal in size. Pericardium: There is no evidence of pericardial effusion. Mitral Valve: The mitral valve is abnormal. There is mild thickening of the mitral valve leaflet(s). There is mild calcification of the mitral valve leaflet(s). Moderate mitral annular calcification. Mild mitral valve regurgitation. No evidence of mitral  valve stenosis. Tricuspid Valve: The tricuspid valve is normal in structure. Tricuspid valve regurgitation is mild . No evidence of tricuspid stenosis. Aortic Valve: Calcified non coronary cusp. The aortic valve is tricuspid. There is mild calcification of the aortic valve. There is mild thickening of the aortic valve. Aortic valve regurgitation is not visualized. Aortic valve sclerosis is present, with  no evidence of aortic valve stenosis. Aortic valve mean gradient measures 6.0 mmHg. Aortic valve peak gradient measures 11.2 mmHg. Aortic valve area, by VTI measures 2.79 cm. Pulmonic Valve: The pulmonic valve was normal in structure. Pulmonic valve regurgitation is not visualized. No evidence of pulmonic stenosis. Aorta: The aortic root is normal in size and  structure. Venous: The inferior vena cava is normal in size with greater than 50% respiratory variability, suggesting right atrial pressure of 3 mmHg. IAS/Shunts: No atrial level shunt detected by color flow Doppler.  LEFT VENTRICLE PLAX 2D LVIDd:         5.00 cm   Diastology LVIDs:         3.60 cm   LV e' medial:    5.98 cm/s LV PW:         1.20 cm   LV E/e' medial:  13.1 LV IVS:        1.30 cm   LV e' lateral:   9.25 cm/s LVOT diam:     2.20 cm   LV E/e' lateral: 8.5 LV SV:         93 LV SV Index:   50 LVOT Area:  3.80 cm  RIGHT VENTRICLE          IVC RV Basal diam:  3.70 cm  IVC diam: 2.30 cm LEFT ATRIUM           Index        RIGHT ATRIUM           Index LA diam:      4.40 cm 2.36 cm/m   RA Area:     20.70 cm LA Vol (A2C): 64.5 ml 34.57 ml/m  RA Volume:   60.80 ml  32.59 ml/m LA Vol (A4C): 68.7 ml 36.82 ml/m  AORTIC VALVE AV Area (Vmax):    2.87 cm AV Area (Vmean):   2.78 cm AV Area (VTI):     2.79 cm AV Vmax:           167.00 cm/s AV Vmean:          112.000 cm/s AV VTI:            0.333 m AV Peak Grad:      11.2 mmHg AV Mean Grad:      6.0 mmHg LVOT Vmax:         126.00 cm/s LVOT Vmean:        81.800 cm/s LVOT VTI:          0.244 m LVOT/AV VTI ratio: 0.73  AORTA Ao Asc diam: 3.40 cm MITRAL VALVE               TRICUSPID VALVE MV Area (PHT): 3.21 cm    TR Peak grad:   35.5 mmHg MV Decel Time: 236 msec    TR Vmax:        298.00 cm/s MV E velocity: 78.60 cm/s MV A velocity: 76.00 cm/s  SHUNTS MV E/A ratio:  1.03        Systemic VTI:  0.24 m                            Systemic Diam: 2.20 cm Jenkins Rouge MD Electronically signed by Jenkins Rouge MD Signature Date/Time: 02/07/2021/11:45:27 AM    Final       PFT Results Latest Ref Rng & Units 07/02/2019  FVC-Pre L 2.74  FVC-Predicted Pre % 100  FVC-Post L 2.71  FVC-Predicted Post % 99  Pre FEV1/FVC % % 68  Post FEV1/FCV % % 69  FEV1-Pre L 1.86  FEV1-Predicted Pre % 91  FEV1-Post L 1.87  DLCO uncorrected ml/min/mmHg 9.87  DLCO UNC% % 53  DLCO  corrected ml/min/mmHg 9.87  DLCO COR %Predicted % 53  DLVA Predicted % 55  TLC L 4.52  TLC % Predicted % 92  RV % Predicted % 84    No results found for: NITRICOXIDE      Assessment & Plan:   COPD (chronic obstructive pulmonary disease) (HCC) Recent exacerbation now resolved.  Continue on triple therapy maintenance inhaler  Plan  Patient Instructions  Mucinex DM Twice daily  As needed  cough/congestion  Fluids and rest  Continue on Oxygen 2l/m . Continue on BREZTRI 2 puffs Twice daily   Albuterol inhaler or neb every 6hrs as needed .  CT chest in 3 months (left lower lung nodule ) (April 2023)  Follow up with in 6 week with Dr. Valeta Harms or Lilyahna Sirmon NP with chest xray and As needed   Please contact office for sooner follow up if symptoms do not improve or worsen or seek emergency care  Chronic diastolic congestive heart failure (HCC) Appears compensated without evidence of volume overload on exam Continue current regimen  Chronic respiratory failure with hypoxia (HCC) Compensated on oxygen continue on O2 to maintain O2 saturations greater than 88 to 90%  Lung nodule CT chest January 2023 showed a 1.5 cm left lower lobe nodule.  We will repeat CT in 3 months.  CAP (community acquired pneumonia) Recent admission with COPD exacerbation and probable pneumonia.  Patient is clinically improved after antibiotics.  Continue on current maintenance regimen.  We will follow-up chest x-ray on return in 4 to 6 weeks.     [name], NP 03/01/2021

## 2021-03-01 NOTE — Assessment & Plan Note (Signed)
Compensated on oxygen continue on O2 to maintain O2 saturations greater than 88 to 90%

## 2021-03-01 NOTE — Assessment & Plan Note (Signed)
Recent exacerbation now resolved.  Continue on triple therapy maintenance inhaler  Plan  Patient Instructions  Mucinex DM Twice daily  As needed  cough/congestion  Fluids and rest  Continue on Oxygen 2l/m . Continue on BREZTRI 2 puffs Twice daily   Albuterol inhaler or neb every 6hrs as needed .  CT chest in 3 months (left lower lung nodule ) (April 2023)  Follow up with in 6 week with Dr. Valeta Harms or Antrone [place]a NP with chest xray and As needed   Please contact office for sooner follow up if symptoms do not improve or worsen or seek emergency care

## 2021-03-01 NOTE — Assessment & Plan Note (Signed)
Recent admission with COPD exacerbation and probable pneumonia.  Patient is clinically improved after antibiotics.  Continue on current maintenance regimen.  We will follow-up chest x-ray on return in 4 to 6 weeks.

## 2021-03-01 NOTE — Assessment & Plan Note (Signed)
Appears compensated without evidence of volume overload on exam.  Continue current regimen 

## 2021-03-01 NOTE — Assessment & Plan Note (Signed)
CT chest January 2023 showed a 1.5 cm left lower lobe nodule.  We will repeat CT in 3 months.

## 2021-03-03 DIAGNOSIS — H16122 Filamentary keratitis, left eye: Secondary | ICD-10-CM | POA: Diagnosis not present

## 2021-03-03 DIAGNOSIS — G245 Blepharospasm: Secondary | ICD-10-CM | POA: Diagnosis not present

## 2021-03-03 DIAGNOSIS — H04123 Dry eye syndrome of bilateral lacrimal glands: Secondary | ICD-10-CM | POA: Diagnosis not present

## 2021-03-03 DIAGNOSIS — H16212 Exposure keratoconjunctivitis, left eye: Secondary | ICD-10-CM | POA: Diagnosis not present

## 2021-03-04 DIAGNOSIS — I252 Old myocardial infarction: Secondary | ICD-10-CM | POA: Diagnosis not present

## 2021-03-04 DIAGNOSIS — I5043 Acute on chronic combined systolic (congestive) and diastolic (congestive) heart failure: Secondary | ICD-10-CM | POA: Diagnosis not present

## 2021-03-04 DIAGNOSIS — J9621 Acute and chronic respiratory failure with hypoxia: Secondary | ICD-10-CM | POA: Diagnosis not present

## 2021-03-04 DIAGNOSIS — I2510 Atherosclerotic heart disease of native coronary artery without angina pectoris: Secondary | ICD-10-CM | POA: Diagnosis not present

## 2021-03-04 DIAGNOSIS — I110 Hypertensive heart disease with heart failure: Secondary | ICD-10-CM | POA: Diagnosis not present

## 2021-03-04 DIAGNOSIS — J439 Emphysema, unspecified: Secondary | ICD-10-CM | POA: Diagnosis not present

## 2021-03-05 ENCOUNTER — Encounter: Payer: Medicare Other | Admitting: Physical Medicine and Rehabilitation

## 2021-03-08 ENCOUNTER — Other Ambulatory Visit: Payer: Self-pay

## 2021-03-08 MED ORDER — CLOPIDOGREL BISULFATE 75 MG PO TABS: 75.0000 mg | ORAL_TABLET | Freq: Every day | ORAL | 0 refills | Status: DC

## 2021-03-08 MED ORDER — CLOPIDOGREL BISULFATE 75 MG PO TABS: 75.0000 mg | ORAL_TABLET | Freq: Every day | ORAL | 0 refills | Status: AC

## 2021-03-08 MED ORDER — BISOPROLOL FUMARATE 5 MG PO TABS: 2.5000 mg | ORAL_TABLET | Freq: Every day | ORAL | 1 refills | Status: DC

## 2021-03-08 NOTE — Addendum Note (Signed)
Addended by: Gean Birchwood on: 03/08/2021 03:57 PM   Modules accepted: Orders

## 2021-03-08 NOTE — Telephone Encounter (Signed)
Reiceved a call from the patient requesting a refill on Plavix and Bisprolol. She states the plavix goes to [facility] or [phone]) and the Bisprolol [facility] on [address].

## 2021-03-09 DIAGNOSIS — I252 Old myocardial infarction: Secondary | ICD-10-CM | POA: Diagnosis not present

## 2021-03-09 DIAGNOSIS — J439 Emphysema, unspecified: Secondary | ICD-10-CM | POA: Diagnosis not present

## 2021-03-09 DIAGNOSIS — J9621 Acute and chronic respiratory failure with hypoxia: Secondary | ICD-10-CM | POA: Diagnosis not present

## 2021-03-09 DIAGNOSIS — I110 Hypertensive heart disease with heart failure: Secondary | ICD-10-CM | POA: Diagnosis not present

## 2021-03-09 DIAGNOSIS — I2510 Atherosclerotic heart disease of native coronary artery without angina pectoris: Secondary | ICD-10-CM | POA: Diagnosis not present

## 2021-03-09 DIAGNOSIS — I5043 Acute on chronic combined systolic (congestive) and diastolic (congestive) heart failure: Secondary | ICD-10-CM | POA: Diagnosis not present

## 2021-03-10 DIAGNOSIS — J9621 Acute and chronic respiratory failure with hypoxia: Secondary | ICD-10-CM | POA: Diagnosis not present

## 2021-03-10 DIAGNOSIS — I5043 Acute on chronic combined systolic (congestive) and diastolic (congestive) heart failure: Secondary | ICD-10-CM | POA: Diagnosis not present

## 2021-03-10 DIAGNOSIS — J439 Emphysema, unspecified: Secondary | ICD-10-CM | POA: Diagnosis not present

## 2021-03-10 DIAGNOSIS — I252 Old myocardial infarction: Secondary | ICD-10-CM | POA: Diagnosis not present

## 2021-03-10 DIAGNOSIS — I110 Hypertensive heart disease with heart failure: Secondary | ICD-10-CM | POA: Diagnosis not present

## 2021-03-10 DIAGNOSIS — I2510 Atherosclerotic heart disease of native coronary artery without angina pectoris: Secondary | ICD-10-CM | POA: Diagnosis not present

## 2021-03-11 ENCOUNTER — Other Ambulatory Visit: Payer: Self-pay

## 2021-03-11 ENCOUNTER — Encounter: Payer: Self-pay | Admitting: Nurse Practitioner

## 2021-03-11 ENCOUNTER — Ambulatory Visit (INDEPENDENT_AMBULATORY_CARE_PROVIDER_SITE_OTHER): Payer: Medicare Other | Admitting: Nurse Practitioner

## 2021-03-11 ENCOUNTER — Encounter (HOSPITAL_COMMUNITY): Payer: Self-pay

## 2021-03-11 ENCOUNTER — Ambulatory Visit (HOSPITAL_COMMUNITY)
Admission: RE | Admit: 2021-03-11 | Discharge: 2021-03-11 | Disposition: A | Discharge status: Home / Self Care | Payer: Medicare Other | Source: Ambulatory Visit | Attending: Nurse Practitioner | Admitting: Nurse Practitioner

## 2021-03-11 ENCOUNTER — Ambulatory Visit (INDEPENDENT_AMBULATORY_CARE_PROVIDER_SITE_OTHER): Payer: Medicare Other

## 2021-03-11 VITALS — BP 124/78 | HR 72 | Temp 98.3°F | Ht 63.0 in | Wt 181.0 lb

## 2021-03-11 DIAGNOSIS — R059 Cough, unspecified: Secondary | ICD-10-CM | POA: Diagnosis not present

## 2021-03-11 DIAGNOSIS — J439 Emphysema, unspecified: Secondary | ICD-10-CM | POA: Diagnosis not present

## 2021-03-11 DIAGNOSIS — J189 Pneumonia, unspecified organism: Secondary | ICD-10-CM

## 2021-03-11 DIAGNOSIS — R0602 Shortness of breath: Secondary | ICD-10-CM | POA: Diagnosis not present

## 2021-03-11 DIAGNOSIS — J9611 Chronic respiratory failure with hypoxia: Secondary | ICD-10-CM

## 2021-03-11 DIAGNOSIS — I517 Cardiomegaly: Secondary | ICD-10-CM | POA: Diagnosis not present

## 2021-03-11 DIAGNOSIS — I5042 Chronic combined systolic (congestive) and diastolic (congestive) heart failure: Secondary | ICD-10-CM | POA: Diagnosis not present

## 2021-03-11 DIAGNOSIS — J449 Chronic obstructive pulmonary disease, unspecified: Secondary | ICD-10-CM | POA: Diagnosis not present

## 2021-03-11 DIAGNOSIS — J441 Chronic obstructive pulmonary disease with (acute) exacerbation: Secondary | ICD-10-CM

## 2021-03-11 MED ORDER — SODIUM CHLORIDE (PF) 0.9 % IJ SOLN
INTRAMUSCULAR | Status: AC
  Filled 2021-03-11: qty 50

## 2021-03-11 MED ORDER — METHYLPREDNISOLONE ACETATE 80 MG/ML IJ SUSP
80.0000 mg | Freq: Once | INTRAMUSCULAR | Status: AC
  Administered 2021-03-11: 80 mg via INTRAMUSCULAR

## 2021-03-11 MED ORDER — IOHEXOL 350 MG/ML SOLN
100.0000 mL | Freq: Once | INTRAVENOUS | Status: AC | PRN
  Administered 2021-03-11: 80 mL via INTRAVENOUS

## 2021-03-11 MED ORDER — PREDNISONE 10 MG PO TABS
ORAL_TABLET | ORAL | 0 refills | Status: DC
Start: 2021-03-11 — End: 2021-03-19

## 2021-03-11 NOTE — Progress Notes (Signed)
Canceled echocardiogram. She had an enlarged pulmonary artery on her CT scan back in April 2022 and her most recent echo in January only showed mild pulmonary HTN. Thanks

## 2021-03-11 NOTE — Progress Notes (Signed)
Advise patient no evidence of blood clot on her CTA chest. Severe emphysema. Clustered nodular opacities in RLL, slightly increased. Could be infectious or inflammatory; however, she has been treated with two courses of antibiotics. I am going to order a swallow study to ensure she is not aspirating, as this could be causing these changes. I am going to put her on a prednisone taper. She can start taking it tomorrow since we gave her a steroid injection today. She also had enlargement of her pulmonary artery so I am going to order an echocardiogram to assess for pulmonary hypertension as this could be contributing to her SOB. Thanks!

## 2021-03-11 NOTE — Addendum Note (Signed)
Addended by: Clayton Bibles on: 03/11/2021 05:52 PM   Modules accepted: Orders

## 2021-03-11 NOTE — Addendum Note (Signed)
Addended by: Clayton Bibles on: 03/11/2021 05:38 PM   Modules accepted: Orders

## 2021-03-11 NOTE — Assessment & Plan Note (Addendum)
Clinical improvement of cough and afebrile. Completed two abx courses at this point. CXR with improved aeration and without evidence of superimposed infection. Reticular opacities appear to have resolved.

## 2021-03-11 NOTE — Progress Notes (Signed)
Also repeat CT chest in 3 months, as previously scheduled.

## 2021-03-11 NOTE — Assessment & Plan Note (Signed)
Appears compensated on exam and CXR without overt pulmonary edema. If there's no evidence of PE and she does not have improvement with prednisone course, can trial with another increased course of lasix and will have her follow up with cards.

## 2021-03-11 NOTE — Patient Instructions (Addendum)
Continue Albuterol inhaler 2 puffs or 3 mL neb every 6 hours as needed for shortness of breath or wheezing. Notify if symptoms persist despite rescue inhaler/neb use. Perform neb at least twice a day until symptoms improve Continue Breztri 2 puffs Twice daily  Continue lasix 20 mg daily  Continue singulair 10 mg At bedtime  Continue mucinex 600 mg Twice daily for chest congestion/cough Continue supplemental oxygen 2 lpm for goal oxygen level >88-90%. Notify of any increasing requirements or seek emergency care.  CTA chest with PE protocol today to evaluate for blood clot in your lung. I will call you with results. If there is no evidence of a clot, I will send in a prednisone taper for you to begin tomorrow as you had your steroid injection today.   Follow up in 1 week with Dr. Valeta Harms or Alanson Aly. If symptoms do not improve or worsen, please contact office for sooner follow up or seek emergency care.

## 2021-03-11 NOTE — Assessment & Plan Note (Signed)
She has severe COPD; worsening DOE could be flare related to coming off prednisone. Depo inj 80 mg x 1 in office today. Will tx with slow prednisone taper if no evidence of PE on CTA. Continue triple therapy and PRN nebs/albuterol. Close follow up.

## 2021-03-11 NOTE — Progress Notes (Signed)
Spoke with pt and she voices understanding. Pt did not have any further questions.

## 2021-03-11 NOTE — Assessment & Plan Note (Signed)
Progressive worsening in shortness of breath over the past week. Cough has improved. CXR without evidence of superimposed infection or overt pulmonary edema. Concern for possible PE given pleuritic pain, recent immobilization, and significant SOB. Will obtain STAT CTA with PE protocol for further evaluation. AECOPD vs worsening HF possible; although appears compensated on exam from a fluid standpoint.   Patient Instructions  Continue Albuterol inhaler 2 puffs or 3 mL neb every 6 hours as needed for shortness of breath or wheezing. Notify if symptoms persist despite rescue inhaler/neb use. Perform neb at least twice a day until symptoms improve Continue Breztri 2 puffs Twice daily  Continue lasix 20 mg daily  Continue singulair 10 mg At bedtime  Continue mucinex 600 mg Twice daily for chest congestion/cough Continue supplemental oxygen 2 lpm for goal oxygen level >88-90%. Notify of any increasing requirements or seek emergency care.  CTA chest with PE protocol today to evaluate for blood clot in your lung. I will call you with results. If there is no evidence of a clot, I will send in a prednisone taper for you to begin tomorrow as you had your steroid injection today.   Follow up in 1 week with Dr. Valeta Harms or Alanson Aly. If symptoms do not improve or worsen, please contact office for sooner follow up or seek emergency care.

## 2021-03-11 NOTE — Progress Notes (Signed)
@Patient  ID: [name], female    DOB: 02-27-1945, 76 y.o.   MRN: [id number]  Chief Complaint  Patient presents with   Follow-up    She reports that she is worse and more short of breath with exertion and resting. She can get up to go to the rest room and it takes her a long time to catch her breath,     Referring provider: Harlan Stains, MD  HPI: 76 year old female, former smoker (100 pack years) followed for COPD with emphysema and chronic respiratory failure on supplemental oxygen. She is a patient of Dr. Juline Patch and last seen in office 03/01/2021 by Royal Piedra, NP. She participates in the lung cancer screening program. Past medical history significant for CAD, hx of NSTEMI, CHF, HTN, GERD, arthritis, anxiety and depression, bipolar disorder, alcoholism.  TEST/EVENTS:  02/06/2021 CT chest w contrast: cardiomegaly noted. Extensive CAD and atherosclerosis. Moderate to severe emphysema present. There are mild ground-glass opacities and interstitial opacities within both lungs, relatively unchanged; pna vs edema. 1.5 cm nodular opacity at LLL with increased atelectasis, could be atelectasis but needs repeat imaging.  02/15/2021 CXR 2 view: reticular opacities noted at posterior lung base, concerning for multifocal pna. Potentially r/t aspiration. Emphysema present with increased retrosternal airspace, flattened hemidiaphragms, and increased AP diameter with hyperinflation.   02/19/2021: OV with Parrett, NP for hospital follow up. Seen by cardiology earlier in the week and changed from [place] to [facility]. Ongoing cough and DOE at visit. Started on prednisone after calling into the office. CXR from 1/30 with reticular opacities along the posterior lung bases and a 1.5 cm nodular opacity at LLL. Tx with 7 day augmentin course and slow prednisone taper. Provided with extra lasix dose for 2 days given significant elevation in BNP during hospitalization and increased weight. Repeat CT in 3 months for  nodule follow up  03/01/2021: OV with Parrett,NP for follow up. AECOPD resolved with improvement in symptoms. Continued on triple therapy Breztri and supplemental O2. Plans for CT in 3 months for follow up of nodule and repeat CXR in 4-6 weeks for eval of pna.   03/11/2021: Today - acute Patient presents today with friend for worsening shortness of breath.  She has noticed progressive worsening of her shortness of breath which now is occurring at rest.  It takes her a significant amount of time to catch her breath after getting up to use the restroom. She is also experiencing some new upper back pain, worse with breathing. Her cough has improved and is occasional with minimal sputum production. She denies any fevers, interim sick exposures, orthopnea, PND, chest pain or lower extremity swelling. She continues on [place] Twice daily and has been using her albuterol nebs a few times a day. She continues on supplemental oxygen at 2 lpm and has not had any readings <  Allergies  Allergen Reactions   Cephalexin Diarrhea    Other reaction(s): diarrhea   Doxycycline Diarrhea   Gabapentin Other (See Comments)    sleepiness   Morphine And Related Anxiety    Was confused when taking it    Immunization History  Administered Date(s) Administered   Fluad Quad(high Dose 65+) 11/01/2018   Influenza Split 01/18/2008, 11/10/2009   Influenza, High Dose Seasonal PF 09/30/2011   Influenza,inj,quad, With Preservative 10/17/2016   Influenza-Unspecified 12/07/2010, 09/17/2013, 11/16/2020   Moderna Sars-Covid-2 Vaccination 03/01/2019, 03/29/2019, 12/02/2019   OPV 08/24/2020   Pneumococcal Conjugate-13 08/23/2018   Pneumococcal Polysaccharide-23 04/12/2010   Pneumococcal-Unspecified 01/18/2008, 04/12/2010  Tdap 04/25/2011   Zoster Recombinat (Shingrix) 08/06/2017   Zoster, Live 04/28/2010, 08/06/2017    Past Medical History:  Diagnosis Date   (HFpEF) heart failure with preserved ejection fraction ([place])  02/24/2017   Echo 05/2020: EF 55, severe inferior HK, mild LVH, Gr1 DD, normal RVSF, RVSP 32.4, mild BAE, mild MR, AV sclerosis without stenosis   Alcoholism ([place])    recovering since 2000   Anxiety and depression    Arthritis BACK   Bipolar disorder ([place])    Blepharospasm LEFT EYE   Chronic back pain    COPD (chronic obstructive pulmonary disease) ([place])    Coronary artery disease    s/p Inf MI in 2000 tx with PCI to RCA // Myoview in 2019 with inf-lat scar and mild peri-infarct ischemia   Emphysema    GERD (gastroesophageal reflux disease)    History of alcohol abuse RECOVERING SINCE 2000   History of Left renal mass 03/02/2012   underwent partial nephrectomy   HTN (hypertension)    Idiopathic acute facial nerve palsy LEFT SIDE--  BOTOX THERAPY   Inferior MI ([place]) 2000--  POST PTCA W/ STENT X1   Ischemic cardiomyopathy    EF returned to normal   Osteoporosis    Other and unspecified general anesthetics causing adverse effect in therapeutic use post op delirium--  last anes record w/ chart  from   09-22-2009 (spinal w/ light sedation)   Peripheral vascular disease ([place]) POST RIGHT CAROTID SURG.  1995   Renal cell carcinoma ([place]) 07/09/2012   Left mass   Rosacea LEFT FACIAL RASH   S/P radiation therapy 02/21/2012   38.75 Gy HDR 5 Fractions- vaginal cuff   Scoliosis    Seizures (H[facility])    x 1 after abrupt discontinuation of  Clonidine   Status post carotid endarterectomy RIGHT --  1995   Status post primary angioplasty with coronary stent 2000--  POST INFERIOR MI   Unstable balance WALKS W/ CANE   Vaginal cancer ([place])     Tobacco History: Social History   Tobacco Use  Smoking Status Former   Packs/day: 2.00   Years: 50.00   Pack years: 100.00   Types: Cigarettes   Quit date: 01/17/2010   Years since quitting: 11.1  Smokeless Tobacco Never  Tobacco Comments   STATES QUIT SMOKING 01-17-2010   Counseling given: Not Answered Tobacco comments: STATES QUIT SMOKING  01-17-2010   Outpatient Medications Prior to Visit  Medication Sig Dispense Refill   acetaminophen (TYLENOL) 325 MG tablet Take 2 tablets (650 mg total) by mouth every 4 (four) hours as needed for mild pain ((score 1 to 3) or temp > 100.5).     albuterol (PROVENTIL) (2.5 MG/3ML) 0.083% nebulizer solution Take 3 mLs (2.5 mg total) by nebulization every 6 (six) hours as needed for wheezing or shortness of breath. 75 mL 12   albuterol (VENTOLIN HFA) 108 (90 Base) MCG/ACT inhaler Inhale 2 puffs into the lungs every 6 (six) hours as needed for wheezing. 18 g 1   ARIPiprazole (ABILIFY) 10 MG tablet Take 1 tablet (10 mg total) by mouth daily. 30 tablet 0   Ascorbic Acid (VITAMIN C) 1000 MG tablet Take 1,000 mg by mouth daily.     aspirin EC 81 MG tablet Take 81 mg by mouth every morning.     bisoprolol (ZEBETA) 5 MG tablet Take 0.5 tablets (2.5 mg total) by mouth daily. 15 tablet 1   Budeson-Glycopyrrol-Formoterol (BREZTRI AEROSPHERE) 160-9-4.8 MCG/ACT AERO Inhale 2 puffs into  the lungs in the morning and at bedtime. 10.7 g 5   CADUET 10-20 MG tablet TAKE 1 TABLET BY MOUTH DAILY. 90 tablet 0   Calcium Carb-Cholecalciferol 600-400 MG-UNIT TABS Take 1 tablet by mouth daily. 60 tablet 0   celecoxib (CELEBREX) 200 MG capsule Take 400 mg by mouth daily.     Cholecalciferol (VITAMIN D3) 50 MCG (2000 UT) TABS Take 2,000 Units by mouth daily. 30 tablet 0   clopidogrel (PLAVIX) 75 MG tablet Take 1 tablet (75 mg total) by mouth daily. 30 tablet 0   denosumab (PROLIA) 60 MG/ML SOSY injection Inject 60 mg into the skin every 6 (six) months.     dexlansoprazole (DEXILANT) 60 MG capsule Take 1 capsule (60 mg total) by mouth daily. 30 capsule 0   docusate sodium (COLACE) 100 MG capsule Take 100 mg by mouth daily as needed for mild constipation.     empagliflozin (JARDIANCE) 10 MG TABS tablet Take 1 tablet (10 mg total) by mouth daily. 90 tablet 3   ferrous sulfate 325 (65 FE) MG tablet Take 1 tablet (325 mg total)  by mouth daily. 30 tablet 3   furosemide (LASIX) 20 MG tablet Take 1 tablet (20 mg total) by mouth daily. 30 tablet 3   hydrALAZINE (APRESOLINE) 25 MG tablet Take 1 tablet (25 mg total) by mouth 2 (two) times daily. 60 tablet 6   lamoTRIgine (LAMICTAL) 100 MG tablet Take 2 tablets (200 mg total) by mouth 2 (two) times daily. 120 tablet 0   montelukast (SINGULAIR) 10 MG tablet Take 1 tablet (10 mg total) by mouth at bedtime. TAKE 1 TABLET BY MOUTH EVERYDAY AT BEDTIME (Patient taking differently: Take 10 mg by mouth at bedtime.) 90 tablet 1   Multiple Vitamin (MULTIVITAMIN) capsule Take 1 capsule by mouth daily.      nitroGLYCERIN (NITROSTAT) 0.4 MG SL tablet Place 1 tablet (0.4 mg total) under the tongue every 5 (five) minutes as needed for chest pain. 25 tablet 3   Omega-3 Fatty Acids (FISH OIL) 1000 MG CAPS Take 1,000 mg by mouth 2 (two) times daily.     Oxycodone HCl 10 MG TABS Take 1 tablet (10 mg total) by mouth every 4 (four) hours as needed for severe pain. 30 tablet 0   Polyethyl Glycol-Propyl Glycol (SYSTANE) 0.4-0.3 % GEL ophthalmic gel Place 1 application into both eyes daily as needed (dry eyes).     potassium chloride (KLOR-CON) 8 MEQ tablet Take 1 tablet (8 mEq total) by mouth daily. 30 tablet 6   sacubitril-valsartan (ENTRESTO) 24-26 MG Take 1 tablet by mouth 2 (two) times daily. 60 tablet 0   sertraline (ZOLOFT) 50 MG tablet Take 1 tablet (50 mg total) by mouth daily. 30 tablet 0   tiZANidine (ZANAFLEX) 4 MG tablet Take 1 tablet (4 mg total) by mouth every 6 (six) hours. 120 tablet 0   No facility-administered medications prior to visit.     Review of Systems:   Constitutional: No weight loss or gain, night sweats, fevers, chills, fatigue, or lassitude. HEENT: No headaches, difficulty swallowing, tooth/dental problems, or sore throat. No sneezing, itching, ear ache, nasal congestion, or post nasal drip CV:  No chest pain, orthopnea, PND, swelling in lower extremities, anasarca,  dizziness, palpitations, syncope Resp: +shortness of breath with exertion and occasionally at rest; improved, occasional productive cough with minimal sputum; pleuritic pain with deep breathing. No excess mucus or change in color of mucus. No hemoptysis. No wheezing.  No chest wall deformity  GI:  No heartburn, indigestion, abdominal pain, nausea, vomiting, diarrhea, change in bowel habits, loss of appetite, bloody stools.  GU: No dysuria, change in color of urine, urgency or frequency.  No flank pain, no hematuria  Skin: No rash, lesions, ulcerations MSK:  No joint pain or swelling.  No decreased range of motion.  No back pain. Neuro: No dizziness or lightheadedness.  Psych: No depression or anxiety. Mood stable.     Physical Exam:  BP 124/78 (BP Location: Left Arm, Patient Position: Sitting, Cuff Size: Normal)    Pulse 72    Temp 98.3 F (36.8 C) (Oral)    Ht 5\' 3"  (1.6 m)    Wt 181 lb (82.1 kg)    SpO2 93%    BMI 32.06 kg/m   GEN: Pleasant, interactive, chronically-ill appearing; obese; in no acute distress. HEENT:  Normocephalic and atraumatic. EACs patent bilaterally. TM pearly gray with present light reflex bilaterally. PERRLA. Sclera white. Nasal turbinates pink, moist and patent bilaterally. No rhinorrhea present. Oropharynx pink and moist, without exudate or edema. No lesions, ulcerations, or postnasal drip.  NECK:  Supple w/ fair ROM. No JVD present. Normal carotid impulses w/o bruits. Thyroid symmetrical with no goiter or nodules palpated. No lymphadenopathy.   CV: RRR, no m/r/g, no peripheral edema. Pulses intact, +2 bilaterally. No cyanosis, pallor or clubbing. PULMONARY:  Mild increase in work of breathing. Minimal scattered rhonchi bilaterally A&P. No accessory muscle use. No dullness to percussion. GI: BS present and normoactive. Soft, non-tender to palpation. No organomegaly or masses detected. No CVA tenderness. MSK: No erythema, warmth or tenderness. Cap refil <2 sec all  extrem. No deformities or joint swelling noted.  Neuro: A/Ox3. No focal deficits noted.   Skin: Warm, no lesions or rashe Psych: Normal affect and behavior. Judgement and thought content appropriate.     Lab Results:  CBC    Component Value Date/Time   WBC 10.3 02/16/2021 0910   WBC 11.6 (H) 02/08/2021 1604   RBC 4.05 02/16/2021 0910   RBC 3.85 (L) 02/08/2021 1604   HGB 13.9 02/16/2021 0910   HCT 37.7 02/16/2021 0910   PLT 283 02/16/2021 0910   MCV 93 02/16/2021 0910   MCH 34.3 (H) 02/16/2021 0910   MCH 34.5 (H) 02/08/2021 1604   MCHC 36.9 (H) 02/16/2021 0910   MCHC 36.0 02/08/2021 1604   RDW 12.2 02/16/2021 0910   LYMPHSABS 0.7 02/16/2021 0910   MONOABS 0.8 09/03/2020 0500   EOSABS 0.1 02/16/2021 0910   BASOSABS 0.0 02/16/2021 0910    BMET    Component Value Date/Time   NA 137 02/24/2021 1100   K 4.7 02/24/2021 1100   CL 97 02/24/2021 1100   CO2 23 02/24/2021 1100   GLUCOSE 122 (H) 02/24/2021 1100   GLUCOSE 114 (H) 02/09/2021 0438   BUN 18 02/24/2021 1100   CREATININE 0.94 02/24/2021 1100   CALCIUM 10.8 (H) 02/24/2021 1100   GFRNONAA 54 (L) 02/09/2021 0438   GFRAA 73 03/06/2019 1649    BNP    Component Value Date/Time   BNP 1,642.5 (H) 02/07/2021 0416     Imaging:  03/11/2021: CXR today reviewed by me. Lungs remain hyperinflated with some chronic interstitial coarsening and emphysema. Improved aeration when compared to previous. No acute process noted.   DG Chest 2 View  Result Date: 03/11/2021 CLINICAL DATA:  Shortness of breath. Productive cough. Recent pneumonia. EXAM: CHEST - 2 VIEW COMPARISON:  02/15/2021 FINDINGS: The cardiac silhouette remains mildly enlarged. Thoracic aortic  tortuosity and atherosclerotic calcification are noted. The lungs are hyperinflated with chronic interstitial coarsening and underlying emphysema. There is improved aeration of the lung bases. No acute airspace consolidation, overt pulmonary edema, pleural effusion, or  pneumothorax is identified. Right humeral ORIF and chronic thoracic compression fractures are again noted. IMPRESSION: COPD with improved aeration of the lung bases. No evidence of acute cardiopulmonary process. Electronically Signed   By: Logan Bores M.D.   On: 03/11/2021 10:50   DG Chest 2 View  Result Date: 02/15/2021 CLINICAL DATA:  76 year old female with cough EXAM: CHEST - 2 VIEW COMPARISON:  02/06/2021 FINDINGS: Cardiomediastinal silhouette unchanged in size. Tortuosity of the descending thoracic aorta, likely accentuated given the kyphotic positioning. Reticular opacities throughout the lungs. Stigmata of emphysema, with increased retrosternal airspace, flattened hemidiaphragms, increased AP diameter, and hyperinflation on the AP view. No pneumothorax. No pleural effusion. Reticulonodular opacities at the posterior lung base on the lateral view correspond to findings on recent chest CT. IMPRESSION: Reticulonodular opacity at the posterior lung base, corresponding to findings on prior CT, concerning for multifocal infection, potentially aspiration related given the location. Emphysema and chronic lung changes. Electronically Signed   By: Corrie Mckusick D.O.   On: 02/15/2021 16:45    methylPREDNISolone acetate (DEPO-MEDROL) injection 80 mg     Date Action Dose Route User   03/11/2021 1131 Given 80 mg Intramuscular (Left Upper Outer Quadrant) Mabe, Beatris Ship, CMA       PFT Results Latest Ref Rng & Units 07/02/2019  FVC-Pre L 2.74  FVC-Predicted Pre % 100  FVC-Post L 2.71  FVC-Predicted Post % 99  Pre FEV1/FVC % % 68  Post FEV1/FCV % % 69  FEV1-Pre L 1.86  FEV1-Predicted Pre % 91  FEV1-Post L 1.87  DLCO uncorrected ml/min/mmHg 9.87  DLCO UNC% % 53  DLCO corrected ml/min/mmHg 9.87  DLCO COR %Predicted % 53  DLVA Predicted % 55  TLC L 4.52  TLC % Predicted % 92  RV % Predicted % 84    No results found for: NITRICOXIDE      Assessment & Plan:   Shortness of  breath Progressive worsening in shortness of breath over the past week. Cough has improved. CXR without evidence of superimposed infection or overt pulmonary edema. Concern for possible PE given pleuritic pain, recent immobilization, and significant SOB. Will obtain STAT CTA with PE protocol for further evaluation. AECOPD vs worsening HF possible; although appears compensated on exam from a fluid standpoint.   Patient Instructions  Continue Albuterol inhaler 2 puffs or 3 mL neb every 6 hours as needed for shortness of breath or wheezing. Notify if symptoms persist despite rescue inhaler/neb use. Perform neb at least twice a day until symptoms improve Continue Breztri 2 puffs Twice daily  Continue lasix 20 mg daily  Continue singulair 10 mg At bedtime  Continue mucinex 600 mg Twice daily for chest congestion/cough Continue supplemental oxygen 2 lpm for goal oxygen level >88-90%. Notify of any increasing requirements or seek emergency care.  CTA chest with PE protocol today to evaluate for blood clot in your lung. I will call you with results. If there is no evidence of a clot, I will send in a prednisone taper for you to begin tomorrow as you had your steroid injection today.   Follow up in 1 week with Dr. Valeta Harms or Alanson Aly. If symptoms do not improve or worsen, please contact office for sooner follow up or seek emergency care.    COPD with  acute exacerbation ([place]) She has severe COPD; worsening DOE could be flare related to coming off prednisone. Depo inj 80 mg x 1 in office today. Will tx with slow prednisone taper if no evidence of PE on CTA. Continue triple therapy and PRN nebs/albuterol. Close follow up.  Chronic respiratory failure with hypoxia (HCC) Oxygen stable at visit. Completed half a lap, d/t worsening SOB, without desaturation on 2 lpm. Maintained sats >93% while in office. Advised to monitor closely at home and seek emergency care if unable to maintain sats >88-90%.  CAP  (community acquired pneumonia) Clinical improvement of cough and afebrile. Completed two abx courses at this point. CXR with improved aeration and without evidence of superimposed infection. Reticular opacities appear to have resolved.   Chronic combined systolic and diastolic CHF (congestive heart failure) ([place]) Appears compensated on exam and CXR without overt pulmonary edema. If there's no evidence of PE and she does not have improvement with prednisone course, can trial with another increased course of lasix and will have her follow up with cards.     I spent 47 of dedicated to the care of this patient on the date of this encounter to include pre-visit review of records, face-to-face time with the patient discussing conditions above, post visit ordering of testing, clinical documentation with the electronic health record, making appropriate referrals as documented, and communicating necessary findings to members of the patients care team.  Clayton Bibles, NP 03/11/2021  Pt aware and understands NP's role.

## 2021-03-11 NOTE — Assessment & Plan Note (Signed)
Oxygen stable at visit. Completed half a lap, d/t worsening SOB, without desaturation on 2 lpm. Maintained sats >93% while in office. Advised to monitor closely at home and seek emergency care if unable to maintain sats >88-90%.

## 2021-03-12 ENCOUNTER — Other Ambulatory Visit (HOSPITAL_COMMUNITY): Payer: Self-pay

## 2021-03-12 DIAGNOSIS — R1310 Dysphagia, unspecified: Secondary | ICD-10-CM

## 2021-03-12 DIAGNOSIS — R059 Cough, unspecified: Secondary | ICD-10-CM

## 2021-03-15 ENCOUNTER — Other Ambulatory Visit (HOSPITAL_BASED_OUTPATIENT_CLINIC_OR_DEPARTMENT_OTHER): Payer: Self-pay | Admitting: Family

## 2021-03-15 ENCOUNTER — Telehealth: Payer: Self-pay | Admitting: Cardiology

## 2021-03-15 DIAGNOSIS — J439 Emphysema, unspecified: Secondary | ICD-10-CM | POA: Diagnosis not present

## 2021-03-15 DIAGNOSIS — I447 Left bundle-branch block, unspecified: Secondary | ICD-10-CM | POA: Diagnosis not present

## 2021-03-15 DIAGNOSIS — Z79891 Long term (current) use of opiate analgesic: Secondary | ICD-10-CM | POA: Diagnosis not present

## 2021-03-15 DIAGNOSIS — G8929 Other chronic pain: Secondary | ICD-10-CM | POA: Diagnosis not present

## 2021-03-15 DIAGNOSIS — J9621 Acute and chronic respiratory failure with hypoxia: Secondary | ICD-10-CM | POA: Diagnosis not present

## 2021-03-15 DIAGNOSIS — I083 Combined rheumatic disorders of mitral, aortic and tricuspid valves: Secondary | ICD-10-CM | POA: Diagnosis not present

## 2021-03-15 DIAGNOSIS — M436 Torticollis: Secondary | ICD-10-CM | POA: Diagnosis not present

## 2021-03-15 DIAGNOSIS — Z7951 Long term (current) use of inhaled steroids: Secondary | ICD-10-CM | POA: Diagnosis not present

## 2021-03-15 DIAGNOSIS — I252 Old myocardial infarction: Secondary | ICD-10-CM | POA: Diagnosis not present

## 2021-03-15 DIAGNOSIS — Z9181 History of falling: Secondary | ICD-10-CM | POA: Diagnosis not present

## 2021-03-15 DIAGNOSIS — F32A Depression, unspecified: Secondary | ICD-10-CM | POA: Diagnosis not present

## 2021-03-15 DIAGNOSIS — I255 Ischemic cardiomyopathy: Secondary | ICD-10-CM | POA: Diagnosis not present

## 2021-03-15 DIAGNOSIS — K219 Gastro-esophageal reflux disease without esophagitis: Secondary | ICD-10-CM | POA: Diagnosis not present

## 2021-03-15 DIAGNOSIS — I5042 Chronic combined systolic (congestive) and diastolic (congestive) heart failure: Secondary | ICD-10-CM

## 2021-03-15 DIAGNOSIS — Z791 Long term (current) use of non-steroidal anti-inflammatories (NSAID): Secondary | ICD-10-CM | POA: Diagnosis not present

## 2021-03-15 DIAGNOSIS — M5031 Other cervical disc degeneration,  high cervical region: Secondary | ICD-10-CM | POA: Diagnosis not present

## 2021-03-15 DIAGNOSIS — I700 Atherosclerosis of aorta: Secondary | ICD-10-CM | POA: Diagnosis not present

## 2021-03-15 DIAGNOSIS — E669 Obesity, unspecified: Secondary | ICD-10-CM | POA: Diagnosis not present

## 2021-03-15 DIAGNOSIS — I110 Hypertensive heart disease with heart failure: Secondary | ICD-10-CM | POA: Diagnosis not present

## 2021-03-15 DIAGNOSIS — I2510 Atherosclerotic heart disease of native coronary artery without angina pectoris: Secondary | ICD-10-CM | POA: Diagnosis not present

## 2021-03-15 DIAGNOSIS — Z7902 Long term (current) use of antithrombotics/antiplatelets: Secondary | ICD-10-CM | POA: Diagnosis not present

## 2021-03-15 DIAGNOSIS — M8448XS Pathological fracture, other site, sequela: Secondary | ICD-10-CM | POA: Diagnosis not present

## 2021-03-15 DIAGNOSIS — I5043 Acute on chronic combined systolic (congestive) and diastolic (congestive) heart failure: Secondary | ICD-10-CM | POA: Diagnosis not present

## 2021-03-15 DIAGNOSIS — Z7982 Long term (current) use of aspirin: Secondary | ICD-10-CM | POA: Diagnosis not present

## 2021-03-15 DIAGNOSIS — R911 Solitary pulmonary nodule: Secondary | ICD-10-CM | POA: Diagnosis not present

## 2021-03-15 DIAGNOSIS — Z9981 Dependence on supplemental oxygen: Secondary | ICD-10-CM | POA: Diagnosis not present

## 2021-03-15 NOTE — Telephone Encounter (Signed)
Pt c/o medication issue:  1. Name of Medication: ENTRESTO 24-26 MG  2. How are you currently taking this medication (dosage and times per day)? Calling to clarify  3. Are you having a reaction (difficulty breathing--STAT)? no  4. What is your medication issue? She states she thought she was supposed to take 49-51 strength, but a prescription was sent for 24-26 this morning. She states she does not understand why it keeps getting mixed up.

## 2021-03-15 NOTE — Telephone Encounter (Signed)
Spoke to patient she stated she received new prescription of Entresto 49/51 mg.[place] [facility] got a supply in.Advised I correct her medication list.

## 2021-03-15 NOTE — Telephone Encounter (Signed)
Rx(s) sent to pharmacy electronically.  

## 2021-03-18 DIAGNOSIS — I252 Old myocardial infarction: Secondary | ICD-10-CM | POA: Diagnosis not present

## 2021-03-18 DIAGNOSIS — J9621 Acute and chronic respiratory failure with hypoxia: Secondary | ICD-10-CM | POA: Diagnosis not present

## 2021-03-18 DIAGNOSIS — J439 Emphysema, unspecified: Secondary | ICD-10-CM | POA: Diagnosis not present

## 2021-03-18 DIAGNOSIS — I2510 Atherosclerotic heart disease of native coronary artery without angina pectoris: Secondary | ICD-10-CM | POA: Diagnosis not present

## 2021-03-18 DIAGNOSIS — I5043 Acute on chronic combined systolic (congestive) and diastolic (congestive) heart failure: Secondary | ICD-10-CM | POA: Diagnosis not present

## 2021-03-18 DIAGNOSIS — I110 Hypertensive heart disease with heart failure: Secondary | ICD-10-CM | POA: Diagnosis not present

## 2021-03-19 ENCOUNTER — Ambulatory Visit (INDEPENDENT_AMBULATORY_CARE_PROVIDER_SITE_OTHER): Payer: Medicare Other | Admitting: Adult Health

## 2021-03-19 ENCOUNTER — Other Ambulatory Visit: Payer: Self-pay

## 2021-03-19 ENCOUNTER — Encounter: Payer: Self-pay | Admitting: Adult Health

## 2021-03-19 ENCOUNTER — Telehealth: Payer: Self-pay | Admitting: Student

## 2021-03-19 ENCOUNTER — Telehealth: Payer: Self-pay | Admitting: Cardiology

## 2021-03-19 DIAGNOSIS — J189 Pneumonia, unspecified organism: Secondary | ICD-10-CM | POA: Diagnosis not present

## 2021-03-19 DIAGNOSIS — J9611 Chronic respiratory failure with hypoxia: Secondary | ICD-10-CM | POA: Diagnosis not present

## 2021-03-19 DIAGNOSIS — I5042 Chronic combined systolic (congestive) and diastolic (congestive) heart failure: Secondary | ICD-10-CM

## 2021-03-19 DIAGNOSIS — I2510 Atherosclerotic heart disease of native coronary artery without angina pectoris: Secondary | ICD-10-CM

## 2021-03-19 DIAGNOSIS — J439 Emphysema, unspecified: Secondary | ICD-10-CM

## 2021-03-19 DIAGNOSIS — I10 Essential (primary) hypertension: Secondary | ICD-10-CM

## 2021-03-19 MED ORDER — AMLODIPINE BESYLATE 5 MG PO TABS: 5.0000 mg | ORAL_TABLET | Freq: Every day | ORAL | 2 refills | Status: DC

## 2021-03-19 MED ORDER — ATORVASTATIN CALCIUM 20 MG PO TABS: 20.0000 mg | ORAL_TABLET | Freq: Every day | ORAL | 3 refills | Status: DC

## 2021-03-19 NOTE — Assessment & Plan Note (Signed)
Continue on oxygen 2 L to maintain O2 saturations greater than 88 to 90% ?

## 2021-03-19 NOTE — Telephone Encounter (Signed)
? ?  Patient called Answering Service with question about medication change from earlier today. Called and spoke with patient. She called earlier today in regards to low BP and was advised to reduced her Amlodipine from 10mg  to 5mg  daily. However, she takes Caduet (the combo Amlodipine-Atorvastatin tablet) 10-20mg  daily so she is not able to cut this in half. I advised her to stop the Caduet tablet and I will send in separate Amlodipine 5mg  daily tablets and Lipitor 20mg  daily tablets. Patient voiced understanding and thanked me for calling.  ? ?Darreld Mclean, PA-C ?03/19/2021 6:25 PM ? ? ?

## 2021-03-19 NOTE — Progress Notes (Signed)
@Patient  ID: [name], female    DOB: 11-Nov-1945, 76 y.o.   MRN: [id number]  Chief Complaint  Patient presents with   Follow-up    Referring provider: Harlan Stains, MD  HPI: 76 year old female former smoker followed for severe COPD with emphysema and oxygen dependent respiratory failure Participates in low-dose CT cancer screening program Medical history is significant for coronary artery disease, hypertension, congestive heart failure and bipolar disorder, chronic neck pain s/p neck surgery 08/2020-neck muscle weakness .  TEST/EVENTS :  CT chest with contrast on February 06, 2021 showed moderate to severe emphysema, mild groundglass opacities in both lungs.  And a 1.5 nodular opacity in the left lower lobe along with left basilar atelectasis   2D echo February 07, 2021 septal apical and inferior basal hypokinesis, EF 40 to 45%, mildly elevated pulmonary artery systolic pressure.   PFT June 2021 FEV1 91%, ratio 69, FVC 99%, no significant bronchodilator response, DLCO 53%.  03/19/2021 : COPD , pneumonia, oxygen dependent respiratory failure Patient returns for a 1 week follow-up.  Patient has been having slow to resolve COPD exacerbation.  She was admitted in January for acute on chronic hypoxic respiratory failure with COPD exacerbation, community-acquired pneumonia, decompensated heart failure and a non-STEMI.  She was treated with antibiotics, steroids and nebulized bronchodilators.  She also was diuresed.  She was started on Entresto.  2D echo showed a decreased EF at 40 to 45%.  She was discharged on antibiotics but continued to have ongoing symptoms and was given Augmentin for an additional week.  She was seen in the office last week , she was set up for a CT chest angio.  Its was negative for PE on March 11, 2021.  Clustered nodular opacities in the right lower lobe suspicious for infectious/inflammatory process.  Decreased nodular opacity in the left lower lobe likely rectal  flecking atelectasis.  And severe underlying emphysema.  Also noted multiple compression deformities.  Patient was also set up for a swallow evaluation as she is high risk for aspiration.  Last week she was treated with a prednisone taper.  Since last visit patient is feeling better with decreased shortness of breath and cough.  Says today is one of her best days in a long time.  She remains on oxygen 2 L.  No increased oxygen demand.  She is on Breztri twice daily.  Typically uses albuterol inhaler twice daily    Allergies  Allergen Reactions   Cephalexin Diarrhea    Other reaction(s): diarrhea   Doxycycline Diarrhea   Gabapentin Other (See Comments)    sleepiness   Morphine And Related Anxiety    Was confused when taking it    Immunization History  Administered Date(s) Administered   Fluad Quad(high Dose 65+) 11/01/2018   Influenza Split 01/18/2008, 11/10/2009   Influenza, High Dose Seasonal PF 09/30/2011   Influenza,inj,quad, With Preservative 10/17/2016   Influenza-Unspecified 12/07/2010, 09/17/2013, 11/16/2020   Moderna Sars-Covid-2 Vaccination 03/01/2019, 03/29/2019, 12/02/2019   OPV 08/24/2020   Pneumococcal Conjugate-13 08/23/2018   Pneumococcal Polysaccharide-23 04/12/2010   Pneumococcal-Unspecified 01/18/2008, 04/12/2010   Tdap 04/25/2011   Zoster Recombinat (Shingrix) 08/06/2017   Zoster, Live 04/28/2010, 08/06/2017    Past Medical History:  Diagnosis Date   (HFpEF) heart failure with preserved ejection fraction ([place]) 02/24/2017   Echo 05/2020: EF 55, severe inferior HK, mild LVH, Gr1 DD, normal RVSF, RVSP 32.4, mild BAE, mild MR, AV sclerosis without stenosis   Alcoholism ([place])    recovering since 2000  Anxiety and depression    Arthritis BACK   Bipolar disorder (HCC)    Blepharospasm LEFT EYE   Chronic back pain    COPD (chronic obstructive pulmonary disease) (HCC)    Coronary artery disease    s/p Inf MI in 2000 tx with PCI to RCA // Myoview in 2019 with  inf-lat scar and mild peri-infarct ischemia   Emphysema    GERD (gastroesophageal reflux disease)    History of alcohol abuse RECOVERING SINCE 2000   History of Left renal mass 03/02/2012   underwent partial nephrectomy   HTN (hypertension)    Idiopathic acute facial nerve palsy LEFT SIDE--  BOTOX THERAPY   Inferior MI ([place]) 2000--  POST PTCA W/ STENT X1   Ischemic cardiomyopathy    EF returned to normal   Osteoporosis    Other and unspecified general anesthetics causing adverse effect in therapeutic use post op delirium--  last anes record w/ chart  from   09-22-2009 (spinal w/ light sedation)   Peripheral vascular disease ([place]) POST RIGHT CAROTID SURG.  1995   Renal cell carcinoma ([place]) 07/09/2012   Left mass   Rosacea LEFT FACIAL RASH   S/P radiation therapy 02/21/2012   38.75 Gy HDR 5 Fractions- vaginal cuff   Scoliosis    Seizures (HCC)    x 1 after abrupt discontinuation of  Clonidine   Status post carotid endarterectomy RIGHT --  1995   Status post primary angioplasty with coronary stent 2000--  POST INFERIOR MI   Unstable balance WALKS W/ CANE   Vaginal cancer ([place])     Tobacco History: Social History   Tobacco Use  Smoking Status Former   Packs/day: 2.00   Years: 50.00   Pack years: 100.00   Types: Cigarettes   Quit date: 01/17/2010   Years since quitting: 11.1  Smokeless Tobacco Never  Tobacco Comments   STATES QUIT SMOKING 01-17-2010   Counseling given: Not Answered Tobacco comments: STATES QUIT SMOKING 01-17-2010   Outpatient Medications Prior to Visit  Medication Sig Dispense Refill   acetaminophen (TYLENOL) 325 MG tablet Take 2 tablets (650 mg total) by mouth every 4 (four) hours as needed for mild pain ((score 1 to 3) or temp > 100.5).     albuterol (PROVENTIL) (2.5 MG/3ML) 0.083% nebulizer solution Take 3 mLs (2.5 mg total) by nebulization every 6 (six) hours as needed for wheezing or shortness of breath. 75 mL 12   albuterol (VENTOLIN HFA) 108 (90  Base) MCG/ACT inhaler Inhale 2 puffs into the lungs every 6 (six) hours as needed for wheezing. 18 g 1   ARIPiprazole (ABILIFY) 10 MG tablet Take 1 tablet (10 mg total) by mouth daily. 30 tablet 0   Ascorbic Acid (VITAMIN C) 1000 MG tablet Take 1,000 mg by mouth daily.     aspirin EC 81 MG tablet Take 81 mg by mouth every morning.     bisoprolol (ZEBETA) 5 MG tablet Take 0.5 tablets (2.5 mg total) by mouth daily. 15 tablet 1   Budeson-Glycopyrrol-Formoterol (BREZTRI AEROSPHERE) 160-9-4.8 MCG/ACT AERO Inhale 2 puffs into the lungs in the morning and at bedtime. 10.7 g 5   CADUET 10-20 MG tablet TAKE 1 TABLET BY MOUTH DAILY. 90 tablet 0   Calcium Carb-Cholecalciferol 600-400 MG-UNIT TABS Take 1 tablet by mouth daily. 60 tablet 0   celecoxib (CELEBREX) 200 MG capsule Take 400 mg by mouth daily.     Cholecalciferol (VITAMIN D3) 50 MCG (2000 UT) TABS Take 2,000 Units by  mouth daily. 30 tablet 0   clopidogrel (PLAVIX) 75 MG tablet Take 1 tablet (75 mg total) by mouth daily. 30 tablet 0   denosumab (PROLIA) 60 MG/ML SOSY injection Inject 60 mg into the skin every 6 (six) months.     dexlansoprazole (DEXILANT) 60 MG capsule Take 1 capsule (60 mg total) by mouth daily. 30 capsule 0   docusate sodium (COLACE) 100 MG capsule Take 100 mg by mouth daily as needed for mild constipation.     empagliflozin (JARDIANCE) 10 MG TABS tablet Take 1 tablet (10 mg total) by mouth daily. 90 tablet 3   ferrous sulfate 325 (65 FE) MG tablet Take 1 tablet (325 mg total) by mouth daily. 30 tablet 3   furosemide (LASIX) 20 MG tablet Take 1 tablet (20 mg total) by mouth daily. 30 tablet 3   hydrALAZINE (APRESOLINE) 25 MG tablet Take 1 tablet (25 mg total) by mouth 2 (two) times daily. 60 tablet 6   lamoTRIgine (LAMICTAL) 100 MG tablet Take 2 tablets (200 mg total) by mouth 2 (two) times daily. 120 tablet 0   montelukast (SINGULAIR) 10 MG tablet Take 1 tablet (10 mg total) by mouth at bedtime. TAKE 1 TABLET BY MOUTH EVERYDAY AT  BEDTIME (Patient taking differently: Take 10 mg by mouth at bedtime.) 90 tablet 1   Multiple Vitamin (MULTIVITAMIN) capsule Take 1 capsule by mouth daily.      nitroGLYCERIN (NITROSTAT) 0.4 MG SL tablet Place 1 tablet (0.4 mg total) under the tongue every 5 (five) minutes as needed for chest pain. 25 tablet 3   Omega-3 Fatty Acids (FISH OIL) 1000 MG CAPS Take 1,000 mg by mouth 2 (two) times daily.     Oxycodone HCl 10 MG TABS Take 1 tablet (10 mg total) by mouth every 4 (four) hours as needed for severe pain. 30 tablet 0   Polyethyl Glycol-Propyl Glycol (SYSTANE) 0.4-0.3 % GEL ophthalmic gel Place 1 application into both eyes daily as needed (dry eyes).     potassium chloride (KLOR-CON) 8 MEQ tablet Take 1 tablet (8 mEq total) by mouth daily. 30 tablet 6   sacubitril-valsartan (ENTRESTO) 49-51 MG Take 1 tablet by mouth 2 (two) times daily. 60 tablet    sertraline (ZOLOFT) 50 MG tablet Take 1 tablet (50 mg total) by mouth daily. 30 tablet 0   tiZANidine (ZANAFLEX) 4 MG tablet Take 1 tablet (4 mg total) by mouth every 6 (six) hours. 120 tablet 0   predniSONE (DELTASONE) 10 MG tablet 4 tabs for 2 days, then 3 tabs for 2 days, 2 tabs for 2 days, then 1 tab for 2 days, then stop (Patient not taking: Reported on 03/19/2021) 20 tablet 0   No facility-administered medications prior to visit.     Review of Systems:   Constitutional:   No  weight loss, night sweats,  Fevers, chills, fatigue, or  lassitude.  HEENT:   No headaches,  Difficulty swallowing,  Tooth/dental problems, or  Sore throat,                No sneezing, itching, ear ache, nasal congestion, post nasal drip,   CV:  No chest pain,  Orthopnea, PND, swelling in lower extremities, anasarca, dizziness, palpitations, syncope.   GI  No heartburn, indigestion, abdominal pain, nausea, vomiting, diarrhea, change in bowel habits, loss of appetite, bloody stools.   Resp: No shortness of breath with exertion or at rest.  No excess mucus, no  productive cough,  No non-productive cough,  No coughing up of blood.  No change in color of mucus.  No wheezing.  No chest wall deformity  Skin: no rash or lesions.  GU: no dysuria, change in color of urine, no urgency or frequency.  No flank pain, no hematuria   MS:  No joint pain or swelling.  No decreased range of motion.  No back pain.    Physical Exam  BP 100/60    Pulse 75    Temp 98 F (36.7 C) (Oral)    Ht 5\' 3"  (1.6 m)    Wt 186 lb 6.4 oz (84.6 kg)    SpO2 90%    BMI 33.02 kg/m   GEN: A/Ox3; pleasant , NAD, chronically ill-appearing on oxygen, rolling walker, neck brace   HEENT:  [place]/AT,   NOSE-clear, THROAT-clear, no lesions, no postnasal drip or exudate noted.   NECK:  Supple w/ fair ROM; no JVD; normal carotid impulses w/o bruits; no thyromegaly or nodules palpated; no lymphadenopathy.    RESP  Clear  P & A; w/o, wheezes/ rales/ or rhonchi. no accessory muscle use, no dullness to percussion  CARD:  RRR, no m/r/g, no peripheral edema, pulses intact, no cyanosis or clubbing.  GI:   Soft & nt; nml bowel sounds; no organomegaly or masses detected.   Musco: Warm bil, no deformities or joint swelling noted.   Neuro: alert, no focal deficits noted.    Skin: Warm, no lesions or rashes    Lab Results:        Imaging: DG Chest 2 View  Result Date: 03/11/2021 CLINICAL DATA:  Shortness of breath. Productive cough. Recent pneumonia. EXAM: CHEST - 2 VIEW COMPARISON:  02/15/2021 FINDINGS: The cardiac silhouette remains mildly enlarged. Thoracic aortic tortuosity and atherosclerotic calcification are noted. The lungs are hyperinflated with chronic interstitial coarsening and underlying emphysema. There is improved aeration of the lung bases. No acute airspace consolidation, overt pulmonary edema, pleural effusion, or pneumothorax is identified. Right humeral ORIF and chronic thoracic compression fractures are again noted. IMPRESSION: COPD with improved aeration of the lung  bases. No evidence of acute cardiopulmonary process. Electronically Signed   By: Logan Bores M.D.   On: 03/11/2021 10:50   CT Angio Chest Pulmonary Embolism (PE) W or WO Contrast  Result Date: 03/11/2021 CLINICAL DATA:  Dyspnea, arrhythmia, shortness of breath EXAM: CT ANGIOGRAPHY CHEST WITH CONTRAST TECHNIQUE: Multidetector CT imaging of the chest was performed using the standard protocol during bolus administration of intravenous contrast. Multiplanar CT image reconstructions and MIPs were obtained to evaluate the vascular anatomy. RADIATION DOSE REDUCTION: This exam was performed according to the departmental dose-optimization program which includes automated exposure control, adjustment of the mA and/or kV according to patient size and/or use of iterative reconstruction technique. CONTRAST:  78mL OMNIPAQUE IOHEXOL 350 MG/ML SOLN COMPARISON:  Same day chest radiograph, CT chest 02/06/2021 FINDINGS: Cardiovascular: There is adequate opacification of the pulmonary arteries to the segmental level. There is no evidence of pulmonary embolism. The main pulmonary artery is mildly enlarged, suggesting pulmonary hypertension. The heart is mildly enlarged. There are aortic valve and mitral annular calcifications, coronary artery calcifications, and calcified atherosclerotic plaque of the aortic arch. Mediastinum/Nodes: The thyroid is unremarkable. The esophagus is grossly unremarkable. There is no mediastinal, hilar, or axillary lymphadenopathy. Lungs/Pleura: Trachea and central airways are patent There is a background of severe emphysema. Patchy opacities in the left lower lobe likely reflect subsegmental atelectasis. The previously described nodular opacity in the medial left lower lobe is  decreased in conspicuity, and is no longer masslike in appearance. However, there are nodular opacities in the posterior portion of the right lower lobe which are increased in conspicuity compared to the prior study (10-81). The  largest single nodule measures up to approximately 8 mm. There is no focal consolidation or pulmonary edema. There is no pleural effusion or pneumothorax. Upper Abdomen: The imaged portions of the upper abdominal viscera are unremarkable. Musculoskeletal: There is compression deformity of the T8, T11, and T12 vertebral bodies, slightly progressed at T8 since 02/06/2021, with a newly apparent fracture lucency (9-100). There is no other evidence of acute osseous abnormality. There is no suspicious osseous lesion. Review of the MIP images confirms the above findings. IMPRESSION: 1. No evidence of acute pulmonary embolism. 2. Clustered nodular opacities in the right lower lobe are increased in conspicuity since 02/06/2021. These may be infectious or inflammatory in nature with differential including aspiration; however, recommend follow-up CT chest in approximally 3 months to assess for resolution. 3. Decreased conspicuity of the previously described nodular lesion in the left lower lobe, which likely reflected atelectasis 4. Background of severe emphysema. 5. Cardiomegaly with enlargement of the main pulmonary artery suggesting pulmonary hypertension. 6. Aortic valve and mitral annular calcifications, and coronary artery calcifications. 7. Compression deformities of the T8, T11, and T12 vertebral bodies, slightly progressed at T8 since 02/06/2021, with a newly apparent fracture lucency. Correlate with point tenderness. Aortic Atherosclerosis (ICD10-I70.0) and Emphysema (ICD10-J43.9). Electronically Signed   By: Valetta Mole M.D.   On: 03/11/2021 16:00    methylPREDNISolone acetate (DEPO-MEDROL) injection 80 mg     Date Action Dose Route User   03/11/2021 1131 Given 80 mg Intramuscular (Left Upper Outer Quadrant) Mabe, Beatris Ship, CMA       PFT Results Latest Ref Rng & Units 07/02/2019  FVC-Pre L 2.74  FVC-Predicted Pre % 100  FVC-Post L 2.71  FVC-Predicted Post % 99  Pre FEV1/FVC % % 68  Post FEV1/FCV % %  69  FEV1-Pre L 1.86  FEV1-Predicted Pre % 91  FEV1-Post L 1.87  DLCO uncorrected ml/min/mmHg 9.87  DLCO UNC% % 53  DLCO corrected ml/min/mmHg 9.87  DLCO COR %Predicted % 53  DLVA Predicted % 55  TLC L 4.52  TLC % Predicted % 92  RV % Predicted % 84    No results found for: NITRICOXIDE      Assessment & Plan:   COPD (chronic obstructive pulmonary disease) (HCC) Severe COPD with emphysema recent exacerbation now improved. Continue on triple therapy maintenance inhaler with Breztri Albuterol inhaler or nebulizer as needed Activity as able  Plan  Patient Instructions  Mucinex DM Twice daily  As needed  cough/congestion  Continue on Oxygen 2l/m . Continue on BREZTRI 2 puffs Twice daily , rinse after use.   Albuterol inhaler or neb every 6hrs as needed .  CT chest in 3 months as planned  (April 2023)  Follow up with in 3 week with Dr. Valeta Harms and As needed   Please contact office for sooner follow up if symptoms do not improve or worsen or seek emergency care         Chronic combined systolic and diastolic CHF (congestive heart failure) ([place]) Appears compensated continue follow-up with cardiology.  Blood pressure was rechecked today and 100/60 advised to talk to cardiology that blood pressures been running low.  She has no syncope or orthostatic symptoms.  Chronic respiratory failure with hypoxia (HCC) Continue on oxygen 2 L to maintain O2  saturations greater than 88 to 90%.  CAP (community acquired pneumonia) Clinically patient is improving.  Has a swallow evaluation as she is at risk for aspiration.  Follow-up CT chest in April 2023.    [name], NP 03/19/2021

## 2021-03-19 NOTE — Telephone Encounter (Signed)
I would recommend we reduce amlodipine from 10 to 5 mg daily so we can continue heart failure therapy ? ?Kaneshia Cater [place] MD, [facility] ? ?

## 2021-03-19 NOTE — Assessment & Plan Note (Signed)
Severe COPD with emphysema recent exacerbation now improved. ?Continue on triple therapy maintenance inhaler with Breztri ?Albuterol inhaler or nebulizer as needed ?Activity as able ? ?Plan  ?Patient Instructions  ?Mucinex DM Twice daily  As needed  cough/congestion  ?Continue on Oxygen 2l/m . ?Continue on BREZTRI 2 puffs Twice daily , rinse after use.   ?Albuterol inhaler or neb every 6hrs as needed .  ?CT chest in 3 months as planned  (April 2023)  ?Follow up with in 3 week with Dr. Valeta Harms and As needed   ?Please contact office for sooner follow up if symptoms do not improve or worsen or seek emergency care  ? ? ? ?  ? ?

## 2021-03-19 NOTE — Patient Instructions (Addendum)
Mucinex DM Twice daily  As needed  cough/congestion  ?Continue on Oxygen 2l/m . ?Continue on BREZTRI 2 puffs Twice daily , rinse after use.   ?Albuterol inhaler or neb every 6hrs as needed .  ?CT chest in 3 months as planned  (April 2023)  ?Follow up with in 3 week with Dr. Valeta Harms and As needed   ?Please contact office for sooner follow up if symptoms do not improve or worsen or seek emergency care  ? ? ? ?

## 2021-03-19 NOTE — Assessment & Plan Note (Signed)
Appears compensated continue follow-up with cardiology.  Blood pressure was rechecked today and 100/60 advised to talk to cardiology that blood pressures been running low.  She has no syncope or orthostatic symptoms. ?

## 2021-03-19 NOTE — Telephone Encounter (Signed)
Contacted patient, advised of message below. ? ?Patient verbalized understanding. ? ?

## 2021-03-19 NOTE — Assessment & Plan Note (Signed)
Clinically patient is improving.  Has a swallow evaluation as she is at risk for aspiration.  Follow-up CT chest in April 2023. ?

## 2021-03-19 NOTE — Telephone Encounter (Signed)
Contacted patient, she states today her blood pressure has been low today- other days it has been normal. She has had no other issues. Per messages if BP consistently low we could decrease Entresto however, this is the only day this has occurred for her. I did advise for her to make sure she is staying hydrated. Patient states she has no other changes, when asked the last time she checked BP she stated it had been a while ago, I asked her to check it now and it was 141/71. Patient is feeling okay now. No other issues/concerns.  ? ?Advised patient to continue to monitor this weekend, if she needed [place] to call after hours number, otherwise keep appointment with Brunetta Genera, NP on 04/03.  ?Routing to MD to make aware.  ? ?Thanks! ? ?

## 2021-03-19 NOTE — Telephone Encounter (Signed)
Pt c/o BP issue: STAT if pt c/o blurred vision, one-sided weakness or slurred speech ? ?1. What are your last 5 BP readings? 80/68 in the doctor's office at  home 89/54 and 100/60 ? ?2. Are you having any other symptoms (ex. Dizziness, headache, blurred vision, passed out)? A little lightheaded ? ?3. What is your BP issue? Blood pressure is running low, her primary doctor told her to call here ? ?

## 2021-03-22 DIAGNOSIS — D485 Neoplasm of uncertain behavior of skin: Secondary | ICD-10-CM | POA: Diagnosis not present

## 2021-03-22 DIAGNOSIS — D225 Melanocytic nevi of trunk: Secondary | ICD-10-CM | POA: Diagnosis not present

## 2021-03-22 DIAGNOSIS — G518 Other disorders of facial nerve: Secondary | ICD-10-CM | POA: Diagnosis not present

## 2021-03-22 DIAGNOSIS — L821 Other seborrheic keratosis: Secondary | ICD-10-CM | POA: Diagnosis not present

## 2021-03-22 DIAGNOSIS — L538 Other specified erythematous conditions: Secondary | ICD-10-CM | POA: Diagnosis not present

## 2021-03-22 DIAGNOSIS — L814 Other melanin hyperpigmentation: Secondary | ICD-10-CM | POA: Diagnosis not present

## 2021-03-22 DIAGNOSIS — L218 Other seborrheic dermatitis: Secondary | ICD-10-CM | POA: Diagnosis not present

## 2021-03-22 DIAGNOSIS — L820 Inflamed seborrheic keratosis: Secondary | ICD-10-CM | POA: Diagnosis not present

## 2021-03-22 DIAGNOSIS — C44529 Squamous cell carcinoma of skin of other part of trunk: Secondary | ICD-10-CM | POA: Diagnosis not present

## 2021-03-22 DIAGNOSIS — L304 Erythema intertrigo: Secondary | ICD-10-CM | POA: Diagnosis not present

## 2021-03-25 DIAGNOSIS — I5043 Acute on chronic combined systolic (congestive) and diastolic (congestive) heart failure: Secondary | ICD-10-CM | POA: Diagnosis not present

## 2021-03-25 DIAGNOSIS — J9621 Acute and chronic respiratory failure with hypoxia: Secondary | ICD-10-CM | POA: Diagnosis not present

## 2021-03-25 DIAGNOSIS — J439 Emphysema, unspecified: Secondary | ICD-10-CM | POA: Diagnosis not present

## 2021-03-25 DIAGNOSIS — I110 Hypertensive heart disease with heart failure: Secondary | ICD-10-CM | POA: Diagnosis not present

## 2021-03-25 DIAGNOSIS — I2510 Atherosclerotic heart disease of native coronary artery without angina pectoris: Secondary | ICD-10-CM | POA: Diagnosis not present

## 2021-03-25 DIAGNOSIS — I252 Old myocardial infarction: Secondary | ICD-10-CM | POA: Diagnosis not present

## 2021-03-26 ENCOUNTER — Telehealth: Payer: Self-pay | Admitting: Adult Health

## 2021-03-26 MED ORDER — LEVOFLOXACIN 500 MG PO TABS: 500.0000 mg | ORAL_TABLET | Freq: Every day | ORAL | 0 refills | Status: DC

## 2021-03-26 NOTE — Telephone Encounter (Signed)
Called the pt and there was no answer- LMTCB    

## 2021-03-26 NOTE — Telephone Encounter (Signed)
Called and spoke with patient who states that she had pneumonia when she was seen on 3/2. Pt is still having pain in back, difficulty breathing and productive cough with yellow sputum for the last week. NO appts available today. Would like an antibiotic. [facility] [address].  ? ? ?Dr. Valeta Harms please advise ?

## 2021-03-26 NOTE — Telephone Encounter (Signed)
Called and spoke with patient. She verbalized understanding. I was able to get her scheduled for 04/01/21 at 1030am with Katie as this was the only day next week she did not already have an appt.  ? ?RX has sent to pharmacy.  ? ?Nothing further needed at time of call.  ?

## 2021-03-29 DIAGNOSIS — D045 Carcinoma in situ of skin of trunk: Secondary | ICD-10-CM | POA: Diagnosis not present

## 2021-03-29 DIAGNOSIS — D049 Carcinoma in situ of skin, unspecified: Secondary | ICD-10-CM | POA: Diagnosis not present

## 2021-03-30 DIAGNOSIS — M5459 Other low back pain: Secondary | ICD-10-CM | POA: Diagnosis not present

## 2021-03-30 DIAGNOSIS — M5030 Other cervical disc degeneration, unspecified cervical region: Secondary | ICD-10-CM | POA: Diagnosis not present

## 2021-03-30 DIAGNOSIS — M25512 Pain in left shoulder: Secondary | ICD-10-CM | POA: Diagnosis not present

## 2021-03-30 DIAGNOSIS — M5136 Other intervertebral disc degeneration, lumbar region: Secondary | ICD-10-CM | POA: Diagnosis not present

## 2021-03-30 DIAGNOSIS — G894 Chronic pain syndrome: Secondary | ICD-10-CM | POA: Diagnosis not present

## 2021-03-31 ENCOUNTER — Other Ambulatory Visit: Payer: Self-pay

## 2021-03-31 ENCOUNTER — Ambulatory Visit (HOSPITAL_COMMUNITY)
Admission: RE | Admit: 2021-03-31 | Discharge: 2021-03-31 | Disposition: A | Discharge status: Home / Self Care | Payer: Medicare Other | Source: Ambulatory Visit | Attending: Nurse Practitioner | Admitting: Nurse Practitioner

## 2021-03-31 ENCOUNTER — Encounter (HOSPITAL_COMMUNITY): Payer: Self-pay

## 2021-03-31 DIAGNOSIS — Z930 Tracheostomy status: Secondary | ICD-10-CM | POA: Insufficient documentation

## 2021-03-31 DIAGNOSIS — J189 Pneumonia, unspecified organism: Secondary | ICD-10-CM | POA: Insufficient documentation

## 2021-03-31 DIAGNOSIS — R0602 Shortness of breath: Secondary | ICD-10-CM

## 2021-03-31 DIAGNOSIS — R059 Cough, unspecified: Secondary | ICD-10-CM | POA: Diagnosis not present

## 2021-03-31 DIAGNOSIS — R1312 Dysphagia, oropharyngeal phase: Secondary | ICD-10-CM

## 2021-03-31 DIAGNOSIS — R1310 Dysphagia, unspecified: Secondary | ICD-10-CM | POA: Diagnosis not present

## 2021-03-31 DIAGNOSIS — J449 Chronic obstructive pulmonary disease, unspecified: Secondary | ICD-10-CM | POA: Insufficient documentation

## 2021-03-31 NOTE — Progress Notes (Signed)
Please notify pt that her barium swallow study did not show any evidence of aspiration, which is a good thing and not contributing to her recent chest CT findings. She does have some esophageal dysmotility, which means that things do not pass as easily down the esophagus. We can refer her to GI for further workup/eval of her esophagus and possibly help with her swallowing. Thanks.

## 2021-03-31 NOTE — Therapy (Signed)
Modified Barium Swallow Progress Note ? ?Patient Details  ?Name: [name] ?MRN: [id number] ?Date of Birth: 09/18/1945 ? ?Today's Date: 03/31/2021 ? ?Modified Barium Swallow completed.  Full report located under Chart Review in the Imaging Section. ? ?Brief recommendations include the following: ? ?Clinical Impression ?Pt presents with normal oral swallow, mild pharyngeal dysphagia characterized as follows: Pt demonstrates intermittent flash penetration of thin liquids. Occasionally, a trace amount will linger in the laryngeal vestibule, but no aspiration occurred. Remaining trace penetrate clears with throat clear. Swallow reflex noted to trigger at the vallecular sinus with minimal post-swallow residue. Pt has difficulty generating a second/dry swallow independently, needing to wait for reflex to initiate. A small sip of liquid facilitated initiation of a dry swallow to clear post swallow residue. No aspiration was seen on any consistency presented. Esophageal sweep revealed it to be slow to clear. Following this study, recommendations for management of esophageal dysmotility. In addition, pt was encouraged to clear her throat intermittently after drinking thin liquids. This information was reviewed and given in written form. No further ST intervention is recommended at this time. Pt may benefit from esophageal work up. especially if esophageal back flow or regurgitation is causing aspiration events. Thank you for this referral. ?  ? ?Swallow Evaluation Recommendations ? ? Recommended Consults: Consider esophageal assessment ? ? SLP Diet Recommendations: Regular solids;Thin liquid ? ? Liquid Administration via: Cup ? ? Medication Administration: Whole meds with liquid ? ? Supervision: Patient able to self feed ? ? Compensations: Minimize environmental distractions;Slow rate;Small sips/bites;Follow solids with liquid;Clear throat intermittently ? ? Postural Changes: Seated upright at 90 degrees;Remain  semi-upright after after feeds/meals (Comment) ? ? Oral Care Recommendations: Oral care BID ? ?   ? ?Saralee Bolick B. Aloria Looper, MSP, CCC-SLP ?Speech Language Pathologist ?Office: [phone] ? ?Shonna Chock ?03/31/2021,2:02 PM ?

## 2021-04-01 ENCOUNTER — Ambulatory Visit (INDEPENDENT_AMBULATORY_CARE_PROVIDER_SITE_OTHER): Payer: Medicare Other

## 2021-04-01 ENCOUNTER — Encounter: Payer: Self-pay | Admitting: Nurse Practitioner

## 2021-04-01 ENCOUNTER — Ambulatory Visit (INDEPENDENT_AMBULATORY_CARE_PROVIDER_SITE_OTHER): Payer: Medicare Other | Admitting: Nurse Practitioner

## 2021-04-01 VITALS — BP 138/86 | Wt 187.0 lb

## 2021-04-01 DIAGNOSIS — J449 Chronic obstructive pulmonary disease, unspecified: Secondary | ICD-10-CM

## 2021-04-01 DIAGNOSIS — J441 Chronic obstructive pulmonary disease with (acute) exacerbation: Secondary | ICD-10-CM

## 2021-04-01 DIAGNOSIS — I5042 Chronic combined systolic (congestive) and diastolic (congestive) heart failure: Secondary | ICD-10-CM | POA: Diagnosis not present

## 2021-04-01 DIAGNOSIS — R911 Solitary pulmonary nodule: Secondary | ICD-10-CM | POA: Diagnosis not present

## 2021-04-01 DIAGNOSIS — K224 Dyskinesia of esophagus: Secondary | ICD-10-CM

## 2021-04-01 DIAGNOSIS — J189 Pneumonia, unspecified organism: Secondary | ICD-10-CM | POA: Diagnosis not present

## 2021-04-01 DIAGNOSIS — R059 Cough, unspecified: Secondary | ICD-10-CM | POA: Diagnosis not present

## 2021-04-01 MED ORDER — PREDNISONE 10 MG PO TABS
ORAL_TABLET | ORAL | 0 refills | Status: DC
Start: 2021-04-01 — End: 2021-04-07

## 2021-04-01 NOTE — Assessment & Plan Note (Addendum)
Resolution of reticular opacity from previous CT but new clustered nodular opacities in RLL. Initial plans for repeat CT in 3 months; however, given pna and recent treatment, will obtain in 4-6 weeks to ensure resolution.  ?

## 2021-04-01 NOTE — Assessment & Plan Note (Addendum)
Severe COPD with acute exacerbation today. Has had multiple over the past few months. May need to consider daliresp moving forward and/or transition to triple therapy nebs. Slow prednisone taper today. If symptoms worsen after finishing taper, will start on daily prednisone. Continue triple therapy regimen with PRN albuterol.  ? ?Patient Instructions  ?Continue Albuterol inhaler 2 puffs or 3 mL neb every 4 hours as needed for shortness of breath or wheezing. Notify if symptoms persist despite rescue inhaler/neb use. Perform neb at least twice a day until symptoms improve ?Continue Breztri 2 puffs Twice daily  ?Continue lasix 20 mg daily  ?Continue singulair 10 mg At bedtime  ?Continue mucinex 600 mg Twice daily for chest congestion/cough ?Continue supplemental oxygen 2 lpm for goal oxygen level >88-90%. Notify of any increasing requirements or seek emergency care. ? ?-Flutter valve 2-3 times a day  ?-Prednisone taper. 4 tabs for 3 days, then 3 tabs for 3 days, 2 tabs for 3 days, then 1 tab for 3 days, then stop. Take in morning with food. ?-Continue levaquin until completed. We may extend to a 10 day course after we review your x ray.  ? ?Sputum culture today. ? ?Chest x ray today.  ? ?EKG today ?  ?Follow up in 1 week with Dr. Valeta Harms, [name], NP or Katie Raechal Raben,NP. If symptoms do not improve or worsen, please contact office for sooner follow up or seek emergency care. ? ? ?

## 2021-04-01 NOTE — Patient Instructions (Addendum)
Continue Albuterol inhaler 2 puffs or 3 mL neb every 4 hours as needed for shortness of breath or wheezing. Notify if symptoms persist despite rescue inhaler/neb use. Perform neb at least twice a day until symptoms improve ?Continue Breztri 2 puffs Twice daily  ?Continue lasix 20 mg daily  ?Continue singulair 10 mg At bedtime  ?Continue mucinex 600 mg Twice daily for chest congestion/cough ?Continue supplemental oxygen 2 lpm for goal oxygen level >88-90%. Notify of any increasing requirements or seek emergency care. ? ?-Flutter valve 2-3 times a day  ?-Prednisone taper. 4 tabs for 3 days, then 3 tabs for 3 days, 2 tabs for 3 days, then 1 tab for 3 days, then stop. Take in morning with food. ?-Continue levaquin until completed. We may extend to a 10 day course after we review your x ray.  ? ?Sputum culture today. ? ?Chest x ray today.  ? ?EKG today ?  ?Follow up in 1 week with Dr. Valeta Harms, [name], NP or Katie Alyssabeth Bruster,NP. If symptoms do not improve or worsen, please contact office for sooner follow up or seek emergency care. ?

## 2021-04-01 NOTE — Progress Notes (Signed)
? ?'@Patient'$  ID: [name], female    DOB: [birth year], 76 y.o.   MRN: [id number] ? ?No chief complaint on file. ? ? ?Referring provider: ?Harlan Stains, MD ? ?HPI: ?76 year old female, former smoker (100 pack years) followed for COPD with emphysema and chronic respiratory failure on supplemental oxygen. She is a patient of Dr. Juline Patch and last seen in office on 03/19/2021 by Parrett,NP. She also participates in the lung cancer screening program.  Past medical history significant for CAD, history of NSTEMI, CHF, hypertension, GERD, arthritis, anxiety and depression, bipolar disorder, alcoholism. ? ?TEST/EVENTS:  ?02/06/2021 CT chest w contrast: cardiomegaly noted. Extensive CAD and atherosclerosis. Moderate to severe emphysema present. There are mild ground-glass opacities and interstitial opacities within both lungs, relatively unchanged; pna vs edema. 1.5 cm nodular opacity at LLL with increased atelectasis, could be atelectasis but needs repeat imaging.  ?02/15/2021 CXR 2 view: reticular opacities noted at posterior lung base, concerning for multifocal pna. Potentially r/t aspiration. Emphysema present with increased retrosternal airspace, flattened hemidiaphragms, and increased AP diameter with hyperinflation.  ?03/11/2021 CXR 2 view:Cardiac silhouette mildly enlarged.  Lungs were noted to be hyperinflated with chronic interstitial coarsening underlying emphysema.  Improved aeration of the lung bases.  No evidence of acute airspace consolidation. ?03/11/2021 CTA chest: Atherosclerosis.  Background of severe emphysema.  Patchy opacities in the left lower lobe likely reflect subsegmental atelectasis.  Previously identified nodular opacity in the medial LLL is decreased in conspicuity and is no longer masslike in appearance.  However there are nodular opacities in the posterior portion of the RLL which are increasing in space to see compared to the prior study.  Largest single nodule measures up to approximately 8  mm.  Infectious versus inflammatory with differentials including aspiration.  No evidence of PE ?04/01/2021 swallow study: No evidence of aspiration.  Did have some evidence of esophageal dysmotility. ? ?02/19/2021: OV with parent NP for hospital follow-up.  Seen by cardiology earlier in the week and changed from [facility] to [facility].  Ongoing cough and DOE at visit.  Started on prednisone after calling in office.  CXR from 130 with reticular opacities along the posterior lung bases and a 1.5 cm nodular opacity LLL.  Treated with a 7-day course of Augmentin and slow prednisone taper.  Provided with extra dose of Lasix for 2 days given significant elevation in BNP during hospitalization and increased weight.  Repeat CT in 3 months for nodule follow-up ? ?03/01/2021: OV with parent NP for follow-up.  AECOPD resolved with improvement in symptoms.  Continued on triple therapy respiratory and supplemental O2.  Repeat CXR in 4 to 6 weeks for evaluation of pneumonia ? ?03/11/2021: OV with Sharaine Delange NP for worsening shortness of breath.  Takes a significant amount of time to catch breath after using restroom.  Also had some new upper back pain worse with breathing.  Cough is improved with minimal sputum production.  No increasing oxygen requirements.  Concern for possible PE or superimposed infection given worsening shortness of breath and pleuritic pain.  CTA was ordered to rule out PE which was negative.  Did show some increased clustered opacities in the right lower lobe, infectious versus inflammatory.  She was treated with a prednisone taper as she had already completed 2 antibiotic courses.  Advised to notify if symptoms do not improve ? ?03/19/2021: OV with parent NP for close follow-up.  She has been having a slow to resolve COPD exacerbation after pneumonia.  Awaiting swallow study due to  findings on CT scan which was ordered at last visit.  Felt much better at visit and reported that it was one of her best days in a long time.   No increasing oxygen requirements continue triple therapy inhaler and supplemental O2.  Plans to follow-up in 3 weeks.  CT ordered for 3 months from previous ? ?04/01/2021: Today-acute visit ?Patient presents today for worsening shortness of breath.  She also began having increased pain in her back with deep breathing.  She had contacted the office on 03/26/2021 to notify of the symptoms and was concerned she may need another antibiotic.  Rx for Levaquin was sent for 7 days.  Today, she reports feeling relatively the same.  Continues to have a productive cough with white to beige sputum production.  She is also short of breath upon exertion, increased from her baseline.  No increasing oxygen demand; remains on 2 L/min.  No recent fever or chills.  She has not had any interim sick exposures.  Occasional wheeze.  Denies orthopnea, PND, lower extremity edema.  She has not had any hemoptysis or recent weight loss.  She is currently on her last day of Levaquin and is tolerated well.  She continues on [employer] inhaler. ? ?Allergies  ?Allergen Reactions  ? Cephalexin Diarrhea  ?  Other reaction(s): diarrhea  ? Doxycycline Diarrhea  ? Gabapentin Other (See Comments)  ?  sleepiness  ? Morphine And Related Anxiety  ?  Was confused when taking it  ? ? ?Immunization History  ?Administered Date(s) Administered  ? Fluad Quad(high Dose 65+) 11/01/2018  ? Influenza Split 01/18/2008, 11/10/2009  ? Influenza, High Dose Seasonal PF 09/30/2011  ? Influenza,inj,quad, With Preservative 10/17/2016  ? Influenza-Unspecified 12/07/2010, 09/17/2013, 11/16/2020  ? Moderna Sars-Covid-2 Vaccination 03/01/2019, 03/29/2019, 12/02/2019  ? OPV 08/24/2020  ? Pneumococcal Conjugate-13 08/23/2018  ? Pneumococcal Polysaccharide-23 04/12/2010  ? Pneumococcal-Unspecified 01/18/2008, 04/12/2010  ? Tdap 04/25/2011  ? Zoster Recombinat (Shingrix) 08/06/2017  ? Zoster, Live 04/28/2010, 08/06/2017  ? ? ?Past Medical History:  ?Diagnosis Date  ? (HFpEF) heart  failure with preserved ejection fraction ([place]) 02/24/2017  ? Echo 05/2020: EF 55, severe inferior HK, mild LVH, Gr1 DD, normal RVSF, RVSP 32.4, mild BAE, mild MR, AV sclerosis without stenosis  ? Alcoholism ([place])   ? recovering since 2000  ? Anxiety and depression   ? Arthritis BACK  ? Bipolar disorder ([place])   ? Blepharospasm LEFT EYE  ? Chronic back pain   ? COPD (chronic obstructive pulmonary disease) ([place])   ? Coronary artery disease   ? s/p Inf MI in 2000 tx with PCI to RCA // Myoview in 2019 with inf-lat scar and mild peri-infarct ischemia  ? Emphysema   ? GERD (gastroesophageal reflux disease)   ? History of alcohol abuse RECOVERING SINCE 2000  ? History of Left renal mass 03/02/2012  ? underwent partial nephrectomy  ? HTN (hypertension)   ? Idiopathic acute facial nerve palsy LEFT SIDE--  BOTOX THERAPY  ? Inferior MI ([place]) 2000--  POST PTCA W/ STENT X1  ? Ischemic cardiomyopathy   ? EF returned to normal  ? Osteoporosis   ? Other and unspecified general anesthetics causing adverse effect in therapeutic use post op delirium--  last anes record w/ chart  from   09-22-2009 (spinal w/ light sedation)  ? Peripheral vascular disease ([place]) POST RIGHT CAROTID SURG.  1995  ? Renal cell carcinoma ([place]) 07/09/2012  ? Left mass  ? Rosacea LEFT FACIAL RASH  ?  S/P radiation therapy 02/21/2012  ? 38.75 Gy HDR 5 Fractions- vaginal cuff  ? Scoliosis   ? Seizures ([place])   ? x 1 after abrupt discontinuation of  Clonidine  ? Status post carotid endarterectomy RIGHT --  1995  ? Status post primary angioplasty with coronary stent 2000--  POST INFERIOR MI  ? Unstable balance WALKS W/ CANE  ? Vaginal cancer ([place])   ? ? ?Tobacco History: ?Social History  ? ?Tobacco Use  ?Smoking Status Former  ? Packs/day: 2.00  ? Years: 50.00  ? Pack years: 100.00  ? Types: Cigarettes  ? Quit date: 01/17/2010  ? Years since quitting: 11.2  ?Smokeless Tobacco Never  ?Tobacco Comments  ? STATES QUIT SMOKING 01-17-2010  ? ?Counseling given: Not  Answered ?Tobacco comments: STATES QUIT SMOKING 01-17-2010 ? ? ?Outpatient Medications Prior to Visit  ?Medication Sig Dispense Refill  ? acetaminophen (TYLENOL) 325 MG tablet Take 2 tablets (650 mg total) by mouth

## 2021-04-01 NOTE — Assessment & Plan Note (Signed)
No evidence of aspiration on recent swallow study. Did show some esophageal dysmotility. Discussed with pt that we could readdress this at her next appt and refer to GI if she wishes to have further workup done.  ?

## 2021-04-01 NOTE — Assessment & Plan Note (Signed)
Increased opacities in RLL on recent CTA. Initially clinically improved but decompensated around a week ago and was started on Levaquin course. She took her last dose today. Continues to have a productive cough and DOE. Will obtain CXR today. Sputum culture obtained in office. EKG with stable QTc. May extend levaquin to full 10 day course.Close follow up. ?

## 2021-04-01 NOTE — Assessment & Plan Note (Signed)
Appears compensated on exam. No evidence of overt pulm edema on recent imaging. Continue with diuretic therapy. Follow up with cardiology as scheduled.  ?

## 2021-04-04 LAB — RESPIRATORY CULTURE OR RESPIRATORY AND SPUTUM CULTURE
MICRO NUMBER:: 13140053
RESULT:: NORMAL
SPECIMEN QUALITY:: ADEQUATE

## 2021-04-05 NOTE — Progress Notes (Signed)
No acute process such as pna and sputum culture was negative. Hyperinflation of lungs, consistent with COPD, and diffuse interstitial markings, chronic. Suspect her symptoms are mostly very slowly resolving AECOPD.

## 2021-04-05 NOTE — Progress Notes (Signed)
Please notify pt sputum culture showed normal flora which is good news. I will see her on Wednesday for follow up. Thanks

## 2021-04-07 ENCOUNTER — Other Ambulatory Visit: Payer: Self-pay

## 2021-04-07 ENCOUNTER — Encounter: Payer: Self-pay | Admitting: Nurse Practitioner

## 2021-04-07 ENCOUNTER — Ambulatory Visit (INDEPENDENT_AMBULATORY_CARE_PROVIDER_SITE_OTHER): Payer: Medicare Other | Admitting: Nurse Practitioner

## 2021-04-07 VITALS — BP 126/80 | HR 72 | Temp 97.2°F | Ht 63.0 in | Wt 191.0 lb

## 2021-04-07 DIAGNOSIS — J439 Emphysema, unspecified: Secondary | ICD-10-CM

## 2021-04-07 DIAGNOSIS — I5042 Chronic combined systolic (congestive) and diastolic (congestive) heart failure: Secondary | ICD-10-CM | POA: Diagnosis not present

## 2021-04-07 DIAGNOSIS — R911 Solitary pulmonary nodule: Secondary | ICD-10-CM | POA: Diagnosis not present

## 2021-04-07 DIAGNOSIS — J449 Chronic obstructive pulmonary disease, unspecified: Secondary | ICD-10-CM

## 2021-04-07 DIAGNOSIS — J9611 Chronic respiratory failure with hypoxia: Secondary | ICD-10-CM

## 2021-04-07 DIAGNOSIS — I252 Old myocardial infarction: Secondary | ICD-10-CM | POA: Diagnosis not present

## 2021-04-07 DIAGNOSIS — I2510 Atherosclerotic heart disease of native coronary artery without angina pectoris: Secondary | ICD-10-CM | POA: Diagnosis not present

## 2021-04-07 DIAGNOSIS — J9621 Acute and chronic respiratory failure with hypoxia: Secondary | ICD-10-CM | POA: Diagnosis not present

## 2021-04-07 DIAGNOSIS — J189 Pneumonia, unspecified organism: Secondary | ICD-10-CM

## 2021-04-07 DIAGNOSIS — I5043 Acute on chronic combined systolic (congestive) and diastolic (congestive) heart failure: Secondary | ICD-10-CM | POA: Diagnosis not present

## 2021-04-07 DIAGNOSIS — I110 Hypertensive heart disease with heart failure: Secondary | ICD-10-CM | POA: Diagnosis not present

## 2021-04-07 LAB — COMPREHENSIVE METABOLIC PANEL
ALT: 22 U/L (ref 0–35)
AST: 19 U/L (ref 0–37)
Albumin: 4.1 g/dL (ref 3.5–5.2)
Alkaline Phosphatase: 74 U/L (ref 39–117)
BUN: 16 mg/dL (ref 6–23)
CO2: 26 mEq/L (ref 19–32)
Calcium: 10 mg/dL (ref 8.4–10.5)
Chloride: 102 mEq/L (ref 96–112)
Creatinine, Ser: 0.91 mg/dL (ref 0.40–1.20)
GFR: 61.42 mL/min (ref >60.00)
Glucose, Bld: 116 mg/dL — ABNORMAL HIGH (ref 70–99)
Potassium: 4 mEq/L (ref 3.5–5.1)
Sodium: 136 mEq/L (ref 135–145)
Total Bilirubin: 0.5 mg/dL (ref 0.2–1.2)
Total Protein: 6.8 g/dL (ref 6.0–8.3)

## 2021-04-07 MED ORDER — ROFLUMILAST 500 MCG PO TABS: ORAL_TABLET | ORAL | 5 refills | Status: AC

## 2021-04-07 NOTE — Assessment & Plan Note (Signed)
Appears compensated on exam. No evidence of overt pulm edema on recent imaging. Continue with diuretic therapy. Follow up with cardiology as scheduled. ?

## 2021-04-07 NOTE — Progress Notes (Signed)
? ?'@Patient'$  ID: [name], female    DOB: 1945-10-21, 76 y.o.   MRN: [id number] ? ?Chief Complaint  ?Patient presents with  ? Follow-up  ?  She feels that she is not doing better.   ? ? ?Referring provider: ?Harlan Stains, MD ? ?HPI: ?76 year old female, former smoker (100 pack years) followed for COPD with emphysema and chronic respiratory failure on supplemental O2. She is a patient of Dr. Juline Patch and last seen in office on 04/01/2021 by Jenaya Saar,NP. She also participates in the lung cancer screening program. Past medical history significant for CAD, history of NSTEMI, CHF, hypertension, GERD, arthritis, anxiety and depression, bipolar disorder, alcoholism. ? ?TEST/EVENTS:  ?02/06/2021 CT chest with contrast: Extensive CAD and atherosclerosis as well as cardiomegaly.  Moderate to severe emphysema was present.  There was mild groundglass opacities interstitial opacities within both lungs, relatively unchanged concerning for pneumonia versus edema.  1.5 cm nodular opacity at left lower lobe with increased atelectasis. ?02/15/2021 CXR 2 view: Reticular opacities noted at posterior lung base, concerning for multifocal pneumonia.  Potentially related to aspiration.  Emphysema present with increased retrosternal airspace, flattened hemidiaphragms and increased AP diameter with hyperinflation ?03/11/2021 CXR 2 view: Cardiac silhouette mildly enlarged.  Lungs were noted to be hyperinflated with chronic interstitial coarsening and underlying emphysema.  Improved aeration of lung bases.  No evidence of acute airspace consolidation. ?03/11/2021 CTA chest: Atherosclerosis.  Background of severe emphysema.  Patchy airspace opacities in the lateral LLL, likely subsegmental atelectasis.  Previous nodular opacity in medial LLL is decreasing conspicuity and is no longer masslike in appearance.  However there are nodular opacities in the posterior portion of the RLL which are increasing in size when compared to previous.  Largest  single nodule measures up to approximately 8 mm.  Infectious versus inflammatory.  No evidence of PE. ?04/01/2021 swallow study: No evidence of aspiration.  Did have some evidence of esophageal dysmotility ?04/01/2021 CXR 2 view: Mild hyperinflation of lungs with diffuse chronic prominent interstitial lung markings.  No acute process noted. ?04/01/2021 sputum culture: Normal oropharyngeal flora ? ?02/19/2021: OV with Parrett NP for hospital follow-up.  Seen by cardiology earlier in the week and changed from [facility] to [facility].  Ongoing cough and DOE at visit.  Started on prednisone after calling in office.  CXR from 130 with reticular opacities along the posterior lung bases and a 1.5 cm nodular opacity LLL.  Treated with a 7-day course of Augmentin and slow prednisone taper.  Provided with extra dose of Lasix for 2 days given significant elevation in BNP during hospitalization and increased weight.  Repeat CT in 3 months for nodule follow-up ?  ?03/01/2021: OV with Parrett NP for follow-up.  AECOPD resolved with improvement in symptoms.  Continued on triple therapy respiratory and supplemental O2.  Repeat CXR in 4 to 6 weeks for evaluation of pneumonia ? ?03/11/2021: OV with Kassadi Presswood NP for worsening shortness of breath.  Takes a significant amount of time to catch breath after using restroom.  Also had some new upper back pain worse with breathing.  Cough is improved with minimal sputum production.  No increasing oxygen requirements.  Concern for possible PE or superimposed infection given worsening shortness of breath and pleuritic pain.  CTA was ordered to rule out PE which was negative.  Did show some increased clustered opacities in the right lower lobe, infectious versus inflammatory.  She was treated with a prednisone taper as she had already completed 2 antibiotic courses.  Advised to notify if symptoms do not improve ?  ?03/19/2021: OV with Parrett NP for close follow-up.  She has been having a slow to resolve COPD  exacerbation after pneumonia.  Awaiting swallow study due to findings on CT scan which was ordered at last visit.  Felt much better at visit and reported that it was one of her best days in a long time.  No increasing oxygen requirements continue triple therapy inhaler and supplemental O2.  Plans to follow up in 3 weeks.  CT ordered for 3 months from previous ? ?04/01/2021: OV with Katlen Seyer NP for acute visit.  Worsening shortness of breath and increased pain in her back with deep breathing.  Had contacted the office on 03/26/2021 concerned that she may need another antibiotic-Rx for Levaquin was sent for 7 days.  At [place] reported feeling relatively the same with productive cough with white to beige sputum production.  No increasing oxygen demand.  CXR without evidence of superimposed infection.  Sputum culture with normal flora.  Prednisone taper prescribed.  Close follow-up. ? ?04/07/2021: Today - follow up ?Patient presents today for follow up. She feels better than she did last week but continues to have some DOE. Her oxygen levels have been stable and she is able to maintain on 2 lpm. She does continue to have a productive cough with white to beige sputum, which is mostly at her baseline. Occasional wheeze. No fevers or chills. Denies any lower extremity edema or PND. Completed levaquin without difficulties. Remains on prednisone taper and continues on Breztri. She is using her nebs twice daily.  ? ?Allergies  ?Allergen Reactions  ? Cephalexin Diarrhea  ?  Other reaction(s): diarrhea  ? Doxycycline Diarrhea  ? Gabapentin Other (See Comments)  ?  sleepiness  ? Morphine And Related Anxiety  ?  Was confused when taking it  ? ? ?Immunization History  ?Administered Date(s) Administered  ? Fluad Quad(high Dose 65+) 11/01/2018  ? Influenza Split 01/18/2008, 11/10/2009  ? Influenza, High Dose Seasonal PF 09/30/2011  ? Influenza,inj,quad, With Preservative 10/17/2016  ? Influenza-Unspecified 12/07/2010, 09/17/2013, 11/16/2020  ?  Moderna Sars-Covid-2 Vaccination 03/01/2019, 03/29/2019, 12/02/2019  ? OPV 08/24/2020  ? Pneumococcal Conjugate-13 08/23/2018  ? Pneumococcal Polysaccharide-23 04/12/2010  ? Pneumococcal-Unspecified 01/18/2008, 04/12/2010  ? Tdap 04/25/2011  ? Zoster Recombinat (Shingrix) 08/06/2017  ? Zoster, Live 04/28/2010, 08/06/2017  ? ? ?Past Medical History:  ?Diagnosis Date  ? (HFpEF) heart failure with preserved ejection fraction ([place]) 02/24/2017  ? Echo 05/2020: EF 55, severe inferior HK, mild LVH, Gr1 DD, normal RVSF, RVSP 32.4, mild BAE, mild MR, AV sclerosis without stenosis  ? Alcoholism ([place])   ? recovering since 2000  ? Anxiety and depression   ? Arthritis BACK  ? Bipolar disorder ([place])   ? Blepharospasm LEFT EYE  ? Chronic back pain   ? COPD (chronic obstructive pulmonary disease) ([place])   ? Coronary artery disease   ? s/p Inf MI in 2000 tx with PCI to RCA // Myoview in 2019 with inf-lat scar and mild peri-infarct ischemia  ? Emphysema   ? GERD (gastroesophageal reflux disease)   ? History of alcohol abuse RECOVERING SINCE 2000  ? History of Left renal mass 03/02/2012  ? underwent partial nephrectomy  ? HTN (hypertension)   ? Idiopathic acute facial nerve palsy LEFT SIDE--  BOTOX THERAPY  ? Inferior MI ([place]) 2000--  POST PTCA W/ STENT X1  ? Ischemic cardiomyopathy   ? EF returned to normal  ? Osteoporosis   ?  Other and unspecified general anesthetics causing adverse effect in therapeutic use post op delirium--  last anes record w/ chart  from   09-22-2009 (spinal w/ light sedation)  ? Peripheral vascular disease ([place]) POST RIGHT CAROTID SURG.  1995  ? Renal cell carcinoma ([place]) 07/09/2012  ? Left mass  ? Rosacea LEFT FACIAL RASH  ? S/P radiation therapy 02/21/2012  ? 38.75 Gy HDR 5 Fractions- vaginal cuff  ? Scoliosis   ? Seizures ([place])   ? x 1 after abrupt discontinuation of  Clonidine  ? Status post carotid endarterectomy RIGHT --  1995  ? Status post primary angioplasty with coronary stent 2000--  POST INFERIOR MI   ? Unstable balance WALKS W/ CANE  ? Vaginal cancer ([place])   ? ? ?Tobacco History: ?Social History  ? ?Tobacco Use  ?Smoking Status Former  ? Packs/day: 2.00  ? Years: 50.00  ? Pack years: 100.00  ? Types:

## 2021-04-07 NOTE — Patient Instructions (Addendum)
-  Continue Albuterol inhaler 2 puffs or 3 mL neb every 4 hours as needed for shortness of breath or wheezing. Notify if symptoms persist despite rescue inhaler/neb use. Perform neb at least twice a day until symptoms improve ?-Continue Breztri 2 puffs Twice daily  ?-Continue lasix 20 mg daily  ?-Continue singulair 10 mg At bedtime  ?-Continue mucinex 600 mg Twice daily for chest congestion/cough ?-Continue supplemental oxygen 2 lpm for goal oxygen level >88-90%. Notify of any increasing requirements or seek emergency care. ? ?Continue prednisone taper until finished. Take in AM with food ?Start daliresp 250 mcg daily for 28 days then increase 500 mcg daily. ?  ?Follow up in one month with Dr. Valeta Harms. If symptoms do not improve or worsen, please contact office for sooner follow up or seek emergency care. ?

## 2021-04-07 NOTE — Assessment & Plan Note (Signed)
Resolution of reticular opacity from previous CT but new clustered nodular opacities in RLL. Plans to repeat imaging next month.  ?

## 2021-04-07 NOTE — Assessment & Plan Note (Signed)
No increased O2 demand. Continue on 2 lpm supplemental O2. Goal SpO2 >88-90%.  ?

## 2021-04-07 NOTE — Assessment & Plan Note (Signed)
Severe COPD with slow to resolve AECOPD. Has had multiple exacerbations over the past few months. Initiate therapy with daliresp. Complete prednisone taper; advised to notify if worsening symptoms develop after stopping and we will start daily pred. Discussed transitioning to triple therapy nebs but pt would like to hold off on this. Continue triple therapy regimen with PRN albuterol. Referral to pulmonary rehab placed today. ? ?Patient Instructions  ?-Continue Albuterol inhaler 2 puffs or 3 mL neb every 4 hours as needed for shortness of breath or wheezing. Notify if symptoms persist despite rescue inhaler/neb use. Perform neb at least twice a day until symptoms improve ?-Continue Breztri 2 puffs Twice daily  ?-Continue lasix 20 mg daily  ?-Continue singulair 10 mg At bedtime  ?-Continue mucinex 600 mg Twice daily for chest congestion/cough ?-Continue supplemental oxygen 2 lpm for goal oxygen level >88-90%. Notify of any increasing requirements or seek emergency care. ? ?Continue prednisone taper until finished. Take in AM with food ?Start daliresp 250 mcg daily for 28 days then increase 500 mcg daily. ?  ?Follow up in one month with Dr. Valeta Harms. If symptoms do not improve or worsen, please contact office for sooner follow up or seek emergency care. ? ? ?

## 2021-04-07 NOTE — Assessment & Plan Note (Addendum)
CXR without superimposed infection and sputum culture nl. Has completed multiple abx courses. Cough returned to baseline. CT chest planned for next month for evaluation of nodules and ensure resolution of pna. ?

## 2021-04-07 NOTE — Progress Notes (Signed)
CMET unremarkable with normal liver enzymes prior to starting daliresp.

## 2021-04-12 ENCOUNTER — Ambulatory Visit: Payer: Medicare Other | Admitting: Pulmonary Disease

## 2021-04-15 ENCOUNTER — Telehealth (HOSPITAL_COMMUNITY): Payer: Self-pay

## 2021-04-15 NOTE — Telephone Encounter (Signed)
Called and spoke with pt in regards to PR, pt stated she is interested in PR. Explained scheduling process and went over insurance, patient verbalized understanding. Will contact patient for scheduling once RN review. ?

## 2021-04-15 NOTE — Telephone Encounter (Signed)
Pt insurance is active and benefits verified through Medicare A/B. Co-pay $0.00, DED $226.00/$226.00 met, out of pocket $0.00/$0.00 met, co-insurance 20%. No pre-authorization required. 04/15/21 @ 3:04PM ?  ?Will contact patient to see if she is interested in the Pulmonary Rehab Program. ?

## 2021-04-18 NOTE — Progress Notes (Signed)
? ?Office Visit  ?  ?Patient Name: [name] ?Date of Encounter: 04/19/2021 ? ?PCP:  Harlan Stains, MD ?  ?[place]  ?Cardiologist:  Peter Martinique, MD  ?Advanced Practice Provider:  No care team member to display ?Electrophysiologist:  None  ?   ? ?Chief Complaint  ?  ?[name] is a 75 y.o. female with a hx of COPD/emphysemia on 2L O2, CAD, RCC s/p nephrectomy, combined systolic and diastolic heart failure, ischemic cardiomyopathy presents today for heart failure follow up  ? ?Past Medical History  ?  ?Past Medical History:  ?Diagnosis Date  ? (HFpEF) heart failure with preserved ejection fraction ([place]) 02/24/2017  ? Echo 05/2020: EF 55, severe inferior HK, mild LVH, Gr1 DD, normal RVSF, RVSP 32.4, mild BAE, mild MR, AV sclerosis without stenosis  ? Alcoholism ([place])   ? recovering since 2000  ? Anxiety and depression   ? Arthritis BACK  ? Bipolar disorder ([place])   ? Blepharospasm LEFT EYE  ? Chronic back pain   ? COPD (chronic obstructive pulmonary disease) ([place])   ? Coronary artery disease   ? s/p Inf MI in 2000 tx with PCI to RCA // Myoview in 2019 with inf-lat scar and mild peri-infarct ischemia  ? Emphysema   ? GERD (gastroesophageal reflux disease)   ? History of alcohol abuse RECOVERING SINCE 2000  ? History of Left renal mass 03/02/2012  ? underwent partial nephrectomy  ? HTN (hypertension)   ? Idiopathic acute facial nerve palsy LEFT SIDE--  BOTOX THERAPY  ? Inferior MI ([place]) 2000--  POST PTCA W/ STENT X1  ? Ischemic cardiomyopathy   ? EF returned to normal  ? Osteoporosis   ? Other and unspecified general anesthetics causing adverse effect in therapeutic use post op delirium--  last anes record w/ chart  from   09-22-2009 (spinal w/ light sedation)  ? Peripheral vascular disease ([place]) POST RIGHT CAROTID SURG.  1995  ? Renal cell carcinoma ([place]) 07/09/2012  ? Left mass  ? Rosacea LEFT FACIAL RASH  ? S/P radiation therapy 02/21/2012  ? 38.75 Gy HDR 5 Fractions- vaginal  cuff  ? Scoliosis   ? Seizures ([place])   ? x 1 after abrupt discontinuation of  Clonidine  ? Status post carotid endarterectomy RIGHT --  1995  ? Status post primary angioplasty with coronary stent 2000--  POST INFERIOR MI  ? Unstable balance WALKS W/ CANE  ? Vaginal cancer ([place])   ? ?Past Surgical History:  ?Procedure Laterality Date  ? BREAST EXCISIONAL BIOPSY Left 2019  ? b9 axilla Bx X 3  ? CAROTID ENDARTERECTOMY  1995  ? RIGHT  ? CATARACT EXTRACTION W/ INTRAOCULAR LENS  IMPLANT, BILATERAL    ? CERVICAL CONIZATION W/BX  09-23-2008  ? CORONARY ANGIOPLASTY WITH STENT PLACEMENT  2000-   INFERIOR MI  ? X1 STENT TO RCA  ? EUS N/A 03/05/2012  ? Procedure: FULL UPPER ENDOSCOPIC ULTRASOUND (EUS) RADIAL and EGD;  Surgeon: Milus Banister, MD;  Location: [facility];  Service: Endoscopy;  Laterality: N/A;  ercp scope first than eus scope  ? HEMIARTHROPLASTY HIP  12-26-2008  ? LEFT FEMORAL NECK FX  ? LEFT HEART CATH AND CORONARY ANGIOGRAPHY N/A 02/08/2021  ? Procedure: LEFT HEART CATH AND CORONARY ANGIOGRAPHY;  Surgeon: Martinique, Peter M, MD;  Location: [place] [facility];  Service: Cardiovascular;  Laterality: N/A;  ? ORIF HIP FRACTURE  02-13-2007  ? RIGHT FEMORAL NECK FX  ?  ORIF RIGHT DISTAL RADIUS AND RIGHT PROXIMAL HUMEROUS NECK FX'S  10-10-2005  ? POSTERIOR CERVICAL LAMINECTOMY N/A 08/28/2020  ? Procedure: Laminectomy and Foraminotomy - Cervical Two-Three, Cervical Three-Four, with lateral mass fusion/ fixation;  Surgeon: Dawley, Theodoro Doing, DO;  Location: [place];  Service: Neurosurgery;  Laterality: N/A;  ? RIGHT SHOULDER SURG.  2007  ? ROBOTIC ASSITED PARTIAL NEPHRECTOMY Left 07/09/2012  ? Procedure: ROBOTIC ASSITED PARTIAL NEPHRECTOMY;  Surgeon: Dutch Gray, MD;  [facility];  Service: Urology;  Laterality: Left;  ? TOTAL HIP ARTHROPLASTY  04-15-2008  ? POST FAILED  RIGHT HIP ORIF FEMORAL FX  ? TOTAL KNEE ARTHROPLASTY  09-22-2009  ? RIGHT  ? UPPER RIGHT VAGINAL REGION  12/28/11  ? BIOPSY: SQUAMOUS CELL CARCINOMA  ? VAGINAL  HYSTERECTOMY  07/06/2009  ? Secondary to dysplasia  ? ? ?Allergies ? ?Allergies  ?Allergen Reactions  ? Cephalexin Diarrhea  ?  Other reaction(s): diarrhea  ? Doxycycline Diarrhea  ? Gabapentin Other (See Comments)  ?  sleepiness  ? Morphine And Related Anxiety  ?  Was confused when taking it  ? ? ?History of Present Illness  ?  ?[name] is a 76 y.o. female with a hx of COPD/emphysemia on 2L O2, CAD, RCC s/p nephrectomy, combined systolic and diastolic heart failure, ischemic cardiomyopathy last seen 02/16/21. ? ?Admitted 02/06/21-02/09/21 after presenting with respiratory distress. CXR bilateral interstitial infiltrates. CT chest with GGO and interstitial opacities concerning for pulmonary edema. Treated with IV lasix, bronchodilators. Due to new LBBB, elevated troponins, LVEF 40-4% and wall motion abnormalities cardiac cath performed 1/23 demonstrating 3 vessel obstructive coronary disease. Not CABG candidate due to chronic respiratory failure, frailty. Vessels tortuous and not amenable for PCI. Plavix added to Aspirin and Bisoprolol initiated. She was discharged on antibiotics and steroids.  ? ?She contacted pulmonology yesterday and was recommended for chest x-ray, prednisone 40 mg X 5 days.  Chest x-ray showed reticular nodular opacity at posterior lung base concerning for multifocal infection  ? ?She was seen 02/16/21 with her friend [name]. She was establishing with [facility] OT. Due to lack of prescription drug coverage through Medicare she was getting Jardiance through [facility] and we were able to facilitate Entresto through them as well.  ? ?She saw pulmonology 02/19/21 and was treated with empiric antibiotics and slow steroid taper due to COPD exacerbation which was slowly resolvin. She was provided extra Lasix '20mg'$  daily x 2 days due to weight gain of 10 pounds over 4-6 weeks. When seen for follow up 03/01/21 by their office she was improved howevere 03/11/21 represented for shortness of breath. CT  performed showing no PE but concerning for clustered nodular opacities in RLL suspicious for infectious/inflammatory process treated with Prednisone taper. Seen 04/07/21 and referred to pulmonary rehab.  ? ?Via phone her [place] was transitioned to separate Amlodipine and Atorvastatin to be able to reduce Amlodipine. She presents today for follow up with her caregiver and friend, [name].  Feels improved since reduced dose of amlodipine.  She still notes occasional hypotensive readings she forgot to bring her blood pressure cuff and readings today with her.  Notes occasional chest pain which occurs at rest we discussed that this is likely multifactorial coronary disease, COPD.  It is no worse compared to her baseline.  Denies chest pain, orthopnea, PND, edema. ? ?EKGs/Labs/Other Studies Reviewed:  ? ?The following studies were reviewed today: ? ?Echo 02/07/21: ?1. Septal apical and inferior basal hypokinesis . Left ventricular  ?ejection fraction, by  estimation, is 40 to 45%. The left ventricle has  ?mildly decreased function. The left ventricle demonstrates regional wall  ?motion abnormalities (see scoring  ?diagram/findings for description). The left ventricular internal cavity  ?size was mildly dilated. There is mild left ventricular hypertrophy. Left  ?ventricular diastolic parameters were normal.  ? 2. Right ventricular systolic function is normal. The right ventricular  ?size is normal. There is mildly elevated pulmonary artery systolic  ?pressure.  ? 3. Left atrial size was moderately dilated.  ? 4. The mitral valve is abnormal. Mild mitral valve regurgitation. No  ?evidence of mitral stenosis. Moderate mitral annular calcification.  ? 5. Calcified non coronary cusp. The aortic valve is tricuspid. There is  ?mild calcification of the aortic valve. There is mild thickening of the  ?aortic valve. Aortic valve regurgitation is not visualized. Aortic valve  ?sclerosis is present, with no  ?evidence of aortic valve  stenosis.  ? 6. The inferior vena cava is normal in size with greater than 50%  ?respiratory variability, suggesting right atrial pressure of 3 mmHg.  ?  ?[place] 02/06/21:  ?  Prox LAD lesion is 80% stenosed. ?

## 2021-04-19 ENCOUNTER — Ambulatory Visit (INDEPENDENT_AMBULATORY_CARE_PROVIDER_SITE_OTHER): Payer: Medicare Other | Admitting: Family

## 2021-04-19 ENCOUNTER — Encounter (HOSPITAL_BASED_OUTPATIENT_CLINIC_OR_DEPARTMENT_OTHER): Payer: Self-pay | Admitting: Family

## 2021-04-19 VITALS — BP 130/68 | HR 70 | Ht 63.0 in | Wt 189.6 lb

## 2021-04-19 DIAGNOSIS — I10 Essential (primary) hypertension: Secondary | ICD-10-CM | POA: Diagnosis not present

## 2021-04-19 DIAGNOSIS — I25118 Atherosclerotic heart disease of native coronary artery with other forms of angina pectoris: Secondary | ICD-10-CM

## 2021-04-19 DIAGNOSIS — E785 Hyperlipidemia, unspecified: Secondary | ICD-10-CM

## 2021-04-19 DIAGNOSIS — Z905 Acquired absence of kidney: Secondary | ICD-10-CM | POA: Diagnosis not present

## 2021-04-19 DIAGNOSIS — I5042 Chronic combined systolic (congestive) and diastolic (congestive) heart failure: Secondary | ICD-10-CM | POA: Diagnosis not present

## 2021-04-19 DIAGNOSIS — J9611 Chronic respiratory failure with hypoxia: Secondary | ICD-10-CM

## 2021-04-19 NOTE — Patient Instructions (Signed)
Medication Instructions:  ?Continue your current medications.  ? ?*If you need a refill on your cardiac medications before your next appointment, please call your pharmacy* ? ? ?Lab Work: ?None ordered today.  ? ?Testing/Procedures: ?None ordered today.  ? ?Follow-Up: ?At [facility], you and your health needs are our priority.  As part of our continuing mission to provide you with exceptional heart care, we have created designated Provider Care Teams.  These Care Teams include your primary Cardiologist (physician) and Advanced Practice Providers (APPs -  Physician Assistants and Nurse Practitioners) who all work together to provide you with the care you need, when you need it. ? ?We recommend signing up for the patient portal called "MyChart".  Sign up information is provided on this After Visit Summary.  MyChart is used to connect with patients for Virtual Visits (Telemedicine).  Patients are able to view lab/test results, encounter notes, upcoming appointments, etc.  Non-urgent messages can be sent to your provider as well.   ?To learn more about what you can do with MyChart, go to [email or web address].   ? ?Your next appointment:   ?In May as scheduled with Dr. Martinique ? ? ?Other Instructions ? ?Bring your BP log and your BP cuff to your next office visit.  ? ?Call our office if your systolic blood pressure (the top number) is consistently more than 130 or less than 110.   ? ? ? ?

## 2021-04-23 ENCOUNTER — Encounter (HOSPITAL_COMMUNITY): Payer: Self-pay | Admitting: *Deleted

## 2021-04-23 NOTE — Progress Notes (Signed)
Received referral from Dr. Valeta Harms for this pt to participate in pulmonary rehab with the diagnosis of COPD Gold Stage 1 for respiratory care services.  Pt completed PFT in 2021 which showed FEV1/FVC 69 and FEV1 post BD 91. Clinical review of pt follow up appt on 3/22 with Marland Kitchen NP  Pulmonary office note.  Pt with Covid Risk Score - 7. Pt appropriate for scheduling for Pulmonary rehab.  Will forward to support staff for scheduling when able as there is a wait list. Maurice Small RN, BSN ?Cardiac and Pulmonary Rehab Nurse Navigator   ?

## 2021-04-27 ENCOUNTER — Telehealth: Payer: Self-pay | Admitting: Nurse Practitioner

## 2021-04-27 NOTE — Telephone Encounter (Signed)
Called and spoke with patient. She states that [place] told her to give [place] a call if her shortness of breath continued so she could send In prednisone. ? ?[place], can you please advise.  ?

## 2021-04-27 NOTE — Telephone Encounter (Signed)
Called patient but she did not answer. Left message for her to call us back.  

## 2021-04-28 ENCOUNTER — Other Ambulatory Visit: Payer: Self-pay | Admitting: Cardiology

## 2021-04-28 ENCOUNTER — Telehealth: Payer: Self-pay | Admitting: Nurse Practitioner

## 2021-04-28 MED ORDER — PREDNISONE 10 MG PO TABS: ORAL_TABLET | ORAL | 0 refills | Status: AC

## 2021-04-28 MED ORDER — PREDNISONE 10 MG PO TABS: 10.0000 mg | ORAL_TABLET | Freq: Every day | ORAL | 0 refills | Status: DC

## 2021-04-28 MED ORDER — AZITHROMYCIN 250 MG PO TABS: ORAL_TABLET | ORAL | 0 refills | Status: DC

## 2021-04-28 NOTE — Telephone Encounter (Signed)
Called and spoke with patient who states she's having shortness of breath that got worse on Saturday. Productive cough with yellow sputum. Requesting Prednisone be called into pharmacy. Pharmacy is [facility] ? ?Katie please advise ?

## 2021-04-28 NOTE — Telephone Encounter (Signed)
Please have patient start prednisone taper then transition to daily prednisone. 4 tabs for 3 days, then 3 tabs for 3 days, 2 tabs for 3 days, then start 10 mg daily. If she doesn't improve or worsens, needs to be seen for OV. She is scheduled with Dr. Valeta Harms on 4/24 as of right now. Thanks.

## 2021-04-28 NOTE — Telephone Encounter (Signed)
Called and spoke with Happyness from [facility] and did clarify the Prednisone order.  ? ?After prednisone taper pack patient will start prednisone '10mg'$  daily.  ? ?Pharmacist voiced understanding. Nothing further needed  ?

## 2021-04-28 NOTE — Telephone Encounter (Signed)
RX for z pak has been sent in as well with Prednisone. Nothing further needed at this time.  ?

## 2021-04-28 NOTE — Telephone Encounter (Signed)
See above message. Also please send z pack for AECOPD; take 2 tabs on day one then 1 tab daily for four additional days. Take with food. Thanks.

## 2021-04-28 NOTE — Telephone Encounter (Signed)
Called and spoke with patient to let her know that Joellen Jersey would like to send in RX for Prednisone taper and then once she is done with the taper she will do 10 mg daily. Patient expressed understanding. Nothing further needed at this time.  ?

## 2021-04-28 NOTE — Addendum Note (Signed)
Addended by: Elby Beck R on: 04/28/2021 11:31 AM ? ? Modules accepted: Orders ? ?

## 2021-04-30 DIAGNOSIS — Z20822 Contact with and (suspected) exposure to covid-19: Secondary | ICD-10-CM | POA: Diagnosis not present

## 2021-05-04 ENCOUNTER — Other Ambulatory Visit: Payer: Self-pay | Admitting: Cardiology

## 2021-05-10 ENCOUNTER — Telehealth: Payer: Self-pay

## 2021-05-10 ENCOUNTER — Ambulatory Visit (INDEPENDENT_AMBULATORY_CARE_PROVIDER_SITE_OTHER): Payer: Medicare Other | Admitting: Pulmonary Disease

## 2021-05-10 ENCOUNTER — Encounter: Payer: Self-pay | Admitting: Pulmonary Disease

## 2021-05-10 VITALS — BP 122/64 | HR 72 | Temp 98.6°F | Ht 63.0 in | Wt 190.0 lb

## 2021-05-10 DIAGNOSIS — R911 Solitary pulmonary nodule: Secondary | ICD-10-CM

## 2021-05-10 DIAGNOSIS — J9611 Chronic respiratory failure with hypoxia: Secondary | ICD-10-CM | POA: Diagnosis not present

## 2021-05-10 DIAGNOSIS — J449 Chronic obstructive pulmonary disease, unspecified: Secondary | ICD-10-CM

## 2021-05-10 DIAGNOSIS — I5032 Chronic diastolic (congestive) heart failure: Secondary | ICD-10-CM | POA: Diagnosis not present

## 2021-05-10 MED ORDER — PREDNISONE 10 MG PO TABS: 10.0000 mg | ORAL_TABLET | Freq: Every day | ORAL | 2 refills | Status: DC

## 2021-05-10 NOTE — Patient Instructions (Signed)
Thank you for visiting Dr. Valeta Harms at [facility]. ?Today we recommend the following: ? ?Orders Placed This Encounter  ?Procedures  ? Amb Referral to Palliative Care  ? ?Drop to 5 mg prednisone daily, cut the '10mg'$  tab in half. ? ?Return in about 6 months (around 11/09/2021) for with APP or Dr. Valeta Harms. ? ? ? ?Please do your part to reduce the spread of COVID-19.  ? ?

## 2021-05-10 NOTE — Progress Notes (Signed)
? ?Synopsis: Referred in December 2021 for establish care with new primary pulmonologist, lung nodule, PCP: By Harlan Stains, MD ? ?Subjective:  ? ?PATIENT ID: [name] GENDER: female DOB: [birth year], MRN: [id number] ? ?Chief Complaint  ?Patient presents with  ? Follow-up  ?  1 month follow up. Pt states that she is still having issues with her COPD. She states when she is up walking she gets winded. Pt is on albuterol and breztri inhaler  ? ? ?This 76 year old female, history of coronary artery disease, longstanding history of smoking, history of renal cell carcinoma, hypertension.  Patient enrolled in our lung cancer screening program had abnormal lung cancer screening CT.  Prior pulmonary function tests with mild obstruction.  CT imaging though has severe emphysema. ? ?06/25/2020 OV: Here today for follow-up for her severe emphysema as well as lung nodule.  She had a repeat noncontrasted CT scan of the chest in February9/20/2022: Here today for follow-up of 2022.  This revealed complete resolution of the small 7.8 mm nodule that was seen in October.  Suspected to be an inflammatory problem.  She is going to plan to reenroll in her annual lung cancer screening program in February 2023.  From a respiratory standpoint she has been doing well on her new triple therapy inhaler regimen and.  She would like to stay on this.  No refills needed at this time.  She is present today in the office with her caregiver.  She is breathing better.  She does use her nebulizer twice a day.  Unfortunately she did have a recent exacerbation that required hospitalization.  She was treated with antibiotics and steroids in May. ? ?OV 12/23/2020: Here today for follow-up regarding COPD.  Currently managed with triple therapy inhaler, Breztri.  Additionally using as needed albuterol.  She usually uses her albuterol nebulizer at least twice a day.  She is able to get out of the house and do things that she needs to do.  Albeit a little  bit slow.  She does feel short of breath with exertion still.  She did recently underwent a spine surgery.  She is recovering from this she does need refills today of her albuterol.  She knows that she has a planned lung cancer screening CT in February 2023. ? ?OV 05/10/2021: Patient here today for follow-up regarding COPD.Patient was last seen in the office on 3 times in February and 2 times in March.  Initially seen in February for a COPD exacerbation and a repeat sent hospitalization for congestive heart failure.  She followed up with APP in short-term.  She was continuing not to feel well and called back in sick at the beginning of March.  Was started on antibiotics again.  Treated for the concern of community-acquired pneumonia.  After that visit had another short-term follow-up to see Katie in the office.  Felt like she was slowly improving.  She called back into the office at the beginning of April with similar complaints of shortness of breath cough sputum production started again on prednisone taper and a Z-Pak.  Breathing better today.  Still on 10 mg prednisone daily. ? ? ?Past Medical History:  ?Diagnosis Date  ? (HFpEF) heart failure with preserved ejection fraction ([place]) 02/24/2017  ? Echo 05/2020: EF 55, severe inferior HK, mild LVH, Gr1 DD, normal RVSF, RVSP 32.4, mild BAE, mild MR, AV sclerosis without stenosis  ? Alcoholism ([place])   ? recovering since 2000  ? Anxiety and depression   ?  Arthritis BACK  ? Bipolar disorder ([place])   ? Blepharospasm LEFT EYE  ? Chronic back pain   ? COPD (chronic obstructive pulmonary disease) ([place])   ? Coronary artery disease   ? s/p Inf MI in 2000 tx with PCI to RCA // Myoview in 2019 with inf-lat scar and mild peri-infarct ischemia  ? Emphysema   ? GERD (gastroesophageal reflux disease)   ? History of alcohol abuse RECOVERING SINCE 2000  ? History of Left renal mass 03/02/2012  ? underwent partial nephrectomy  ? HTN (hypertension)   ? Idiopathic acute facial nerve palsy  LEFT SIDE--  BOTOX THERAPY  ? Inferior MI ([place]) 2000--  POST PTCA W/ STENT X1  ? Ischemic cardiomyopathy   ? EF returned to normal  ? Osteoporosis   ? Other and unspecified general anesthetics causing adverse effect in therapeutic use post op delirium--  last anes record w/ chart  from   09-22-2009 (spinal w/ light sedation)  ? Peripheral vascular disease ([place]) POST RIGHT CAROTID SURG.  1995  ? Renal cell carcinoma ([place]) 07/09/2012  ? Left mass  ? Rosacea LEFT FACIAL RASH  ? S/P radiation therapy 02/21/2012  ? 38.75 Gy HDR 5 Fractions- vaginal cuff  ? Scoliosis   ? Seizures ([place])   ? x 1 after abrupt discontinuation of  Clonidine  ? Status post carotid endarterectomy RIGHT --  1995  ? Status post primary angioplasty with coronary stent 2000--  POST INFERIOR MI  ? Unstable balance WALKS W/ CANE  ? Vaginal cancer ([place])   ?  ? ?Family History  ?Problem Relation Age of Onset  ? Hypertension Mother   ? Hypertension Father   ? Cancer Maternal Grandfather   ?     type unknown  ? Hypertension Sister   ?  ? ?Past Surgical History:  ?Procedure Laterality Date  ? BREAST EXCISIONAL BIOPSY Left 2019  ? b9 axilla Bx X 3  ? CAROTID ENDARTERECTOMY  1995  ? RIGHT  ? CATARACT EXTRACTION W/ INTRAOCULAR LENS  IMPLANT, BILATERAL    ? CERVICAL CONIZATION W/BX  09-23-2008  ? CORONARY ANGIOPLASTY WITH STENT PLACEMENT  2000-   INFERIOR MI  ? X1 STENT TO RCA  ? EUS N/A 03/05/2012  ? Procedure: FULL UPPER ENDOSCOPIC ULTRASOUND (EUS) RADIAL and EGD;  Surgeon: Milus Banister, MD;  [facility];  Service: Endoscopy;  Laterality: N/A;  ercp scope first than eus scope  ? HEMIARTHROPLASTY HIP  12-26-2008  ? LEFT FEMORAL NECK FX  ? LEFT HEART CATH AND CORONARY ANGIOGRAPHY N/A 02/08/2021  ? Procedure: LEFT HEART CATH AND CORONARY ANGIOGRAPHY;  Surgeon: Martinique, Peter M, MD;  Location: [place] [facility];  Service: Cardiovascular;  Laterality: N/A;  ? ORIF HIP FRACTURE  02-13-2007  ? RIGHT FEMORAL NECK FX  ? ORIF RIGHT DISTAL RADIUS AND RIGHT  PROXIMAL HUMEROUS NECK FX'S  10-10-2005  ? POSTERIOR CERVICAL LAMINECTOMY N/A 08/28/2020  ? Procedure: Laminectomy and Foraminotomy - Cervical Two-Three, Cervical Three-Four, with lateral mass fusion/ fixation;  Surgeon: Dawley, Theodoro Doing, DO;  Location: [place];  Service: Neurosurgery;  Laterality: N/A;  ? RIGHT SHOULDER SURG.  2007  ? ROBOTIC ASSITED PARTIAL NEPHRECTOMY Left 07/09/2012  ? Procedure: ROBOTIC ASSITED PARTIAL NEPHRECTOMY;  Surgeon: Dutch Gray, MD;  [facility];  Service: Urology;  Laterality: Left;  ? TOTAL HIP ARTHROPLASTY  04-15-2008  ? POST FAILED  RIGHT HIP ORIF FEMORAL FX  ? TOTAL KNEE ARTHROPLASTY  09-22-2009  ? RIGHT  ?  UPPER RIGHT VAGINAL REGION  12/28/11  ? BIOPSY: SQUAMOUS CELL CARCINOMA  ? VAGINAL HYSTERECTOMY  07/06/2009  ? Secondary to dysplasia  ? ? ?Social History  ? ?Socioeconomic History  ? Marital status: Divorced  ?  Spouse name: Not on file  ? Number of children: 0  ? Years of education: Not on file  ? Highest education level: Not on file  ?Occupational History  ? Occupation: retired  ?Tobacco Use  ? Smoking status: Former  ?  Packs/day: 2.00  ?  Years: 50.00  ?  Pack years: 100.00  ?  Types: Cigarettes  ?  Quit date: 01/17/2010  ?  Years since quitting: 11.3  ? Smokeless tobacco: Never  ? Tobacco comments:  ?  STATES QUIT SMOKING 01-17-2010  ?Vaping Use  ? Vaping Use: Never used  ?Substance and Sexual Activity  ? Alcohol use: Not Currently  ?  Comment: RECOVERING ALCOHOLIC--   QUIT IN 4970  ? Drug use: No  ? Sexual activity: Never  ?Other Topics Concern  ? Not on file  ?Social History Narrative  ? Not on file  ? ?Social Determinants of Health  ? ?Financial Resource Strain: Not on file  ?Food Insecurity: Not on file  ?Transportation Needs: Not on file  ?Physical Activity: Not on file  ?Stress: Not on file  ?Social Connections: Not on file  ?Intimate Partner Violence: Not on file  ?  ? ?Allergies  ?Allergen Reactions  ? Cephalexin Diarrhea  ?  Other reaction(s): diarrhea  ? Doxycycline  Diarrhea  ? Gabapentin Other (See Comments)  ?  sleepiness  ? Morphine And Related Anxiety  ?  Was confused when taking it  ?  ? ?Outpatient Medications Prior to Visit  ?Medication Sig Dispense Refi

## 2021-05-10 NOTE — Addendum Note (Signed)
Addended by: Retia Passe on: 05/10/2021 03:47 PM ? ? Modules accepted: Orders ? ?

## 2021-05-10 NOTE — Telephone Encounter (Signed)
Spoke with patient and scheduled a telephonic Palliative Consult for 05/13/21 @ 3 PM.  ? ?Consent obtained; updated Netsmart, Team List and Epic.  ? ?

## 2021-05-13 ENCOUNTER — Other Ambulatory Visit: Payer: Self-pay | Admitting: *Deleted

## 2021-05-13 ENCOUNTER — Other Ambulatory Visit: Payer: Medicare Other | Admitting: Family Medicine

## 2021-05-13 DIAGNOSIS — I5042 Chronic combined systolic (congestive) and diastolic (congestive) heart failure: Secondary | ICD-10-CM

## 2021-05-13 DIAGNOSIS — K224 Dyskinesia of esophagus: Secondary | ICD-10-CM

## 2021-05-13 MED ORDER — MONTELUKAST SODIUM 10 MG PO TABS: 10.0000 mg | ORAL_TABLET | Freq: Every day | ORAL | 1 refills | Status: AC

## 2021-05-13 NOTE — Progress Notes (Signed)
? ? ?Manufacturing engineer ?Community Palliative Care Consult Note ?Telephone: [phone]  ?Fax: [phone]  ? ?Date of encounter: 05/13/21 ?3:08 PM ?PATIENT NAME: [name] ?[address] ?[place] [address]   ?[phone] (home)  ?DOB: 1945-09-04 ?MRN: [id number] ?PRIMARY CARE PROVIDER:    ?Harlan Stains, MD,  ?[address] ?[place] [address] ?[phone] ? ?REFERRING PROVIDER:   ?Harlan Stains, MD ?[address] ?[address] ?[place],  [place] [address] ?[phone] ? ?RESPONSIBLE PARTY:    ?Contact Information   ? ? Name Relation Home Work Mobile  ? Geraldine Contras [phone]  [phone]  ? Barkow,Michael Brother   [phone]  ? ?  ? ? ?I connected with  Claudie D Lamboy on 05/13/21 by telephone application and verified that I am speaking with the correct person using two identifiers. ?  ?I discussed the limitations of evaluation and management by telemedicine. The patient expressed understanding and agreed to proceed.  ?Palliative Care was asked to follow this patient by consultation request of  Harlan Stains, MD to address advance care planning and complex medical decision making. This is the initial visit.  ? ? ?      ASSESSMENT, SYMPTOM MANAGEMENT AND PLAN / RECOMMENDATIONS:  ? Esophageal dysmotility ?Has had botox for cervical dystonia-monitor for worsening symptoms after injection. ? ?2. Chronic combined systolic and diastolic heart failure ?Stable currently. ?Continue Norvasc 5 mg daily, ASA 81 mg daily, Atorvastatin 20 mg nightly, 1/2 of Zebeta 5 mg daily and Plavix 75 mg daily, Jardiance 10 mg daily and Entresto 49-51 mg BID. ?Encourage daily weight and fluid restriction 1.5 liters daily. ? ? ?Follow up Palliative Care Visit: Palliative care will continue to follow for complex medical decision making, advance care planning, and clarification of goals. Return 1 weeks or prn. ? ? ? ?This visit was coded based on medical decision making  (MDM). ? ?PPS: 70% ? ?HOSPICE ELIGIBILITY/DIAGNOSIS: TBD ? ?Chief Complaint:  ?[facility] received a referral to follow up with patient for chronic disease management of heart failure with preserved EF and chronic respiratory failure.  Follow up is also to address advance directive planning and defining/refining goals of care.  ? ?HISTORY OF PRESENT ILLNESS:  [name] is a 76 y.o. year old female with heart failure with preserved EF as of 02/07/21 with septal, apical and inferior basal hypokinesis and EF 40-45% and mild pulmonary hypertension, CAD, HTN, PVD, carotid artery disease,  COPD and chronic respiratory failure with hypoxia on home O2 @ 2L, GERD, esophageal dysmotility, cervical dystonia and myelopathy, central cord syndrome, vaginal cancer s/p radiation tx and hx of renal cell cancer with left partial nephrectomy, seizures and hx of alcoholism, Bipolar Disorder, neurogenic bladder, chronic pain due to trauma, and abnormality of gait. Pt has recent hx of NSTEMI in past 3 months.   ?Cardiac cath done 02/08/21:   ?   Prox LAD lesion is 80% stenosed ?  Mid LAD lesion is 70% stenosed. ?  2nd Mrg lesion is 70% stenosed. ?  Ost RCA to Prox RCA lesion is 20% stenosed. ?  Prox RCA to Mid RCA lesion is 60% stenosed. ?  Dist RCA lesion is 85% stenosed. ?  LV end diastolic pressure is moderately elevated. ?Plan: Not a candidate for CABG given age and multiple co-morbidities including significant debility. PCI would be very difficult given severe calcification and tortuosity of vessels. I would recommend aggressive medical therapy especially since she denies any angina.  Will add bisoprolol 5 mg daily and Plavix 75 mg daily for ACS indication.  ?  ?She states having significant trouble with breathing issues so Dr Valeta Harms recommended Palliative.  Sleeps in recliner.  No trouble coughing or choking after eating or drinking. No edema in feet since she is on diuretic therapy.  She has DNR,  does not want intubation but does not have completed MOST form.  "I don't fear death I just fear smothering."  Had diagnostic swallow eval done 03/31/21 which showed possible esophageal dysmotility and slow clearance which may allow for possible reversal of contents for aspiration. ? ?History obtained from review of EMR, discussion with  Ms. Courville.  ?I reviewed available labs, medications, imaging, studies and related documents from the EMR.  Records reviewed and summarized above.  ? ?ROS ?General: NAD ?EYES: denies vision changes ?ENMT: denies dysphagia ?Cardiovascular: denies chest pain, endorses DOE at baseline ?Pulmonary: denies cough, denies increased SOB ?Abdomen: endorses good appetite, denies constipation, endorses continence of bowel ?GU: denies dysuria ?MSK:  denies increased weakness, no falls reported ?Skin: denies rashes or wounds ?Neurological: denies pain, denies insomnia ?Psych: Endorses positive mood ?Heme/lymph/immuno: denies bruises, abnormal bleeding ? ?Physical Exam: limited to audible only ?Current and past weights: 190 lbs on 05/10/21, weight 01/29/2021 was 186 lbs 3.2 ounces ?ENMT: intact hearing ?CV:  Can speak in complete sentences without having to stop to breathe ?Pulmonary: No cough or audible wheezing, Using 02@ 2L [place] ?Neuro:  no cognitive impairment ?Psych: non-anxious affect, A and O x 3 ? ? ?CURRENT PROBLEM LIST:  ?Patient Active Problem List  ? Diagnosis Date Noted  ? Esophageal dysmotility 04/01/2021  ? Shortness of breath 03/11/2021  ? CAP (community acquired pneumonia) 03/01/2021  ? Chronic respiratory failure with hypoxia ([place]) 02/19/2021  ? Lung nodule 02/19/2021  ? Elevated troponin   ? Cervical dystonia 11/30/2020  ? Myofascial pain 11/30/2020  ? Abnormality of gait 11/30/2020  ? Chronic pain due to trauma 10/14/2020  ? Pressure injury of skin 09/12/2020  ? Neurogenic bladder 09/09/2020  ? Central cord syndrome ([place]) 09/02/2020  ? Surgery, elective   ? Chronic diastolic  congestive heart failure ([place])   ? Essential hypertension   ? Postoperative pain   ? Cervical myelopathy ([place]) 08/28/2020  ? Spinal stenosis in cervical region 08/28/2020  ? Acute respiratory failure with hypoxia ([place]) 05/28/2020  ? COPD with acute exacerbation ([place]) 05/28/2020  ? Chronic combined systolic and diastolic CHF (congestive heart failure) ([place]) 05/28/2020  ? GERD (gastroesophageal reflux disease) 07/02/2019  ? Chronic diastolic CHF (congestive heart failure) ([place]) 03/07/2019  ? Atherosclerosis of native artery of both lower extremities with intermittent claudication ([place]) 03/30/2017  ? CHF exacerbation ([place]) 02/24/2017  ? Chest pain 02/24/2017  ? Acute congestive heart failure ([place]) 02/24/2017  ? Preoperative clearance 06/13/2012  ? Unspecified gastritis and gastroduodenitis without mention of hemorrhage 03/05/2012  ? Hypoxia 03/04/2012  ? Former smoker 03/04/2012  ? COPD (chronic obstructive pulmonary disease) ([place]) 03/04/2012  ? Abdominal pain, acute, epigastric 03/02/2012  ? Abnormal abdominal CT scan 03/02/2012  ? Hypertension   ? Anxiety and depression   ? Alcoholism ([place])   ? Bipolar disorder ([place])   ? Status post carotid endarterectomy   ? Arthritis   ? Idiopathic acute facial nerve palsy   ? Blepharospasm   ? Anxiety   ? Vaginal cancer ([place])   ? Carotid artery disease ([place]) 06/28/2011  ? Coronary artery disease   ? NSTEMI (non-ST elevated myocardial infarction) (  [place])   ? Peripheral vascular disease ([place])   ? HTN (hypertension)   ? Dysplasia of vagina, histologically confirmed 12/14/2010  ? ?PAST MEDICAL HISTORY:  ?Active Ambulatory Problems  ?  Diagnosis Date Noted  ? Dysplasia of vagina, histologically confirmed 12/14/2010  ? Coronary artery disease   ? NSTEMI (non-ST elevated myocardial infarction) ([place])   ? Peripheral vascular disease ([place])   ? HTN (hypertension)   ? Carotid artery disease ([place]) 06/28/2011  ? Hypertension   ? Anxiety and depression   ? Alcoholism ([place])   ? Bipolar disorder  ([place])   ? Status post carotid endarterectomy   ? Arthritis   ? Idiopathic acute facial nerve palsy   ? Blepharospasm   ? Anxiety   ? Vaginal cancer ([place])   ? Abdominal pain, acute, epigastric 03/02/2012  ? Abnormal abdomina

## 2021-05-15 ENCOUNTER — Encounter: Payer: Self-pay | Admitting: Family Medicine

## 2021-05-16 ENCOUNTER — Encounter: Payer: Self-pay | Admitting: Family Medicine

## 2021-05-16 NOTE — Progress Notes (Signed)
?  ?Cardiology Office Note ? ? ?Date:  05/25/2021  ? ?ID:  [name], DOB 09-07-1945, MRN [id number] ? ?PCP:  Harlan Stains, MD  ?Cardiologist:  Nyheim Seufert Martinique, MD ?EP: None ? ?Chief Complaint  ?Patient presents with  ? Follow-up  ? Shortness of Breath  ? Coronary Artery Disease  ? ? ? ?  ?History of Present Illness: ?[name] is a 76 y.o. female with PMH of CAD s/p inferior MI with PCI to RCA in 2000, chronic combined CHF (EF 45-50% 02/2017), carotid artery stenosis s/p R CEA in 1995, PAD with moderate bilateral fem-pop disease medically, HTN, HLD, COPD, and renal CA s/p partial L nephrectomy in 2014.  ? ?Her last ischemic evaluation was a NST 02/2017 which showed EF 51%, fixed defect in the inferolateral wall which was felt to be reflective of prior inferior MI. Prior  echocardiogram 02/2017 showed EF 45-50%, hypokinesis and scarring of basal inferolateral and inferior myocardium c/w infarction in the RCA, and G1DD.  ? ? Echo was repeated 03/20/19 and showed normal LV function. EF 55-60%. No WMA.  LE dopplers in June showed stable moderate disease.  ? ?She was admitted in May 2022 for COPD exacerbation, and pulmonary edema.  EF normal. She was treated with IV Lasix, Solu-Medrol, and breathing treatments.  It was felt that she had also had decompensated CHF.  She was discharged on as needed oxygen at home.   ? ?In August she was admitted with central cord syndrome and underwent Posterior cervical arthrodesis, C2-3, C3-4, with lateral mass instrumentation; K2M [place] instrumentation,Posterior cervical bilateral laminectomy C2, C3, C4. She was on inpatient Rehab from 8/17-09/18/20.  ? ?Admitted 02/06/21-02/09/21 after presenting with respiratory distress. CXR bilateral interstitial infiltrates. CT chest with GGO and interstitial opacities concerning for pulmonary edema. Treated with IV lasix, bronchodilators. Due to new LBBB, elevated troponins, LVEF 40-45% and wall motion abnormalities cardiac cath performed 1/23  demonstrating 3 vessel obstructive coronary disease. Not CABG candidate due to chronic respiratory failure, frailty. Vessels tortuous and not amenable for PCI. Plavix added to Aspirin and Bisoprolol initiated. She was discharged on antibiotics and steroids.  ? ?On 03/11/21 represented for shortness of breath. CT performed showing no PE but concerning for clustered nodular opacities in RLL suspicious for infectious/inflammatory process treated with Prednisone taper. Seen 04/07/21 and referred to pulmonary rehab. Was seen by Dr Valeta Harms in April and was slowly improving. He recommended reduction in prednisone dose to 5 mg.  ? ?She did have recent CT showing extensive calcification of  the coronary arteries and aorta with possible severe stenosis in the celiac and SMA vessels. There was some improvement in the pulmonary infiltrates.  ? ?On follow up today she notes her breathing is worse since reduction in prednisone dose. Still has cough with some yellow phlegm production. Is SOB with any activity. Sats 94% at rest. Weight is up 16 lbs but no edema. Some chest pain but she can't tell if its her heart or indigestion. Hasn't used Ntg.  ? ? ?Past Medical History:  ?Diagnosis Date  ? (HFpEF) heart failure with preserved ejection fraction ([place]) 02/24/2017  ? Echo 05/2020: EF 55, severe inferior HK, mild LVH, Gr1 DD, normal RVSF, RVSP 32.4, mild BAE, mild MR, AV sclerosis without stenosis  ? Abdominal pain, acute, epigastric 03/02/2012  ? Acute congestive heart failure ([place]) 02/24/2017  ? Alcoholism ([place])   ? recovering since 2000  ? Anxiety and depression   ? Arthritis BACK  ? Bipolar disorder (  HCC)   ? Blepharospasm LEFT EYE  ? Chronic back pain   ? COPD (chronic obstructive pulmonary disease) ([place])   ? Coronary artery disease   ? s/p Inf MI in 2000 tx with PCI to RCA // Myoview in 2019 with inf-lat scar and mild peri-infarct ischemia  ? Emphysema   ? GERD (gastroesophageal reflux disease)   ? History of alcohol abuse RECOVERING  SINCE 2000  ? History of Left renal mass 03/02/2012  ? underwent partial nephrectomy  ? HTN (hypertension)   ? Idiopathic acute facial nerve palsy LEFT SIDE--  BOTOX THERAPY  ? Inferior MI ([name]) 2000--  POST PTCA W/ STENT X1  ? Ischemic cardiomyopathy   ? EF returned to normal  ? Osteoporosis   ? Other and unspecified general anesthetics causing adverse effect in therapeutic use post op delirium--  last anes record w/ chart  from   09-22-2009 (spinal w/ light sedation)  ? Peripheral vascular disease ([place]) POST RIGHT CAROTID SURG.  1995  ? Preoperative clearance 06/13/2012  ? Renal cell carcinoma ([place]) 07/09/2012  ? Left mass  ? Rosacea LEFT FACIAL RASH  ? S/P radiation therapy 02/21/2012  ? 38.75 Gy HDR 5 Fractions- vaginal cuff  ? Scoliosis   ? Seizures ([place])   ? x 1 after abrupt discontinuation of  Clonidine  ? Status post carotid endarterectomy RIGHT --  1995  ? Status post primary angioplasty with coronary stent 2000--  POST INFERIOR MI  ? Unstable balance WALKS W/ CANE  ? Vaginal cancer ([name])   ? ? ?Past Surgical History:  ?Procedure Laterality Date  ? BREAST EXCISIONAL BIOPSY Left 2019  ? b9 axilla Bx X 3  ? CAROTID ENDARTERECTOMY  1995  ? RIGHT  ? CATARACT EXTRACTION W/ INTRAOCULAR LENS  IMPLANT, BILATERAL    ? CERVICAL CONIZATION W/BX  09-23-2008  ? CORONARY ANGIOPLASTY WITH STENT PLACEMENT  2000-   INFERIOR MI  ? X1 STENT TO RCA  ? EUS N/A 03/05/2012  ? Procedure: FULL UPPER ENDOSCOPIC ULTRASOUND (EUS) RADIAL and EGD;  Surgeon: Milus Banister, MD;  Location: [facility];  Service: Endoscopy;  Laterality: N/A;  ercp scope first than eus scope  ? HEMIARTHROPLASTY HIP  12-26-2008  ? LEFT FEMORAL NECK FX  ? LEFT HEART CATH AND CORONARY ANGIOGRAPHY N/A 02/08/2021  ? Procedure: LEFT HEART CATH AND CORONARY ANGIOGRAPHY;  Surgeon: Martinique, Melda Mermelstein M, MD;  Location: [facility];  Service: Cardiovascular;  Laterality: N/A;  ? ORIF HIP FRACTURE  02-13-2007  ? RIGHT FEMORAL NECK FX  ? ORIF RIGHT DISTAL RADIUS AND  RIGHT PROXIMAL HUMEROUS NECK FX'S  10-10-2005  ? POSTERIOR CERVICAL LAMINECTOMY N/A 08/28/2020  ? Procedure: Laminectomy and Foraminotomy - Cervical Two-Three, Cervical Three-Four, with lateral mass fusion/ fixation;  Surgeon: Dawley, Theodoro Doing, DO;  Location: [facility];  Service: Neurosurgery;  Laterality: N/A;  ? RIGHT SHOULDER SURG.  2007  ? ROBOTIC ASSITED PARTIAL NEPHRECTOMY Left 07/09/2012  ? Procedure: ROBOTIC ASSITED PARTIAL NEPHRECTOMY;  Surgeon: Dutch Gray, MD;  Location: [facility];  Service: Urology;  Laterality: Left;  ? TOTAL HIP ARTHROPLASTY  04-15-2008  ? POST FAILED  RIGHT HIP ORIF FEMORAL FX  ? TOTAL KNEE ARTHROPLASTY  09-22-2009  ? RIGHT  ? UPPER RIGHT VAGINAL REGION  12/28/11  ? BIOPSY: SQUAMOUS CELL CARCINOMA  ? VAGINAL HYSTERECTOMY  07/06/2009  ? Secondary to dysplasia  ? ? ? ?Current Outpatient Medications  ?Medication Sig Dispense Refill  ? acetaminophen (TYLENOL) 325 MG tablet Take  2 tablets (650 mg total) by mouth every 4 (four) hours as needed for mild pain ((score 1 to 3) or temp > 100.5).    ? albuterol (PROVENTIL) (2.5 MG/3ML) 0.083% nebulizer solution Take 3 mLs (2.5 mg total) by nebulization every 6 (six) hours as needed for wheezing or shortness of breath. 75 mL 12  ? albuterol (VENTOLIN HFA) 108 (90 Base) MCG/ACT inhaler Inhale 2 puffs into the lungs every 6 (six) hours as needed for wheezing. 18 g 1  ? amLODipine (NORVASC) 5 MG tablet Take 1 tablet (5 mg total) by mouth daily. 30 tablet 2  ? ARIPiprazole (ABILIFY) 10 MG tablet Take 1 tablet (10 mg total) by mouth daily. 30 tablet 0  ? Ascorbic Acid (VITAMIN C) 1000 MG tablet Take 1,000 mg by mouth daily.    ? aspirin EC 81 MG tablet Take 81 mg by mouth every morning.    ? atorvastatin (LIPITOR) 20 MG tablet Take 1 tablet (20 mg total) by mouth daily. 90 tablet 3  ? bisoprolol (ZEBETA) 5 MG tablet TAKE 1/2 TABLET BY MOUTH DAILY 15 tablet 2  ? Budeson-Glycopyrrol-Formoterol (BREZTRI AEROSPHERE) 160-9-4.8 MCG/ACT AERO Inhale 2 puffs into the lungs  in the morning and at bedtime. 10.7 g 5  ? CADUET 10-20 MG tablet TAKE 1 TABLET BY MOUTH DAILY. 90 tablet 0  ? Calcium Carb-Cholecalciferol 600-400 MG-UNIT TABS Take 1 tablet by mouth daily. 60 tablet 0

## 2021-05-18 DIAGNOSIS — Z20822 Contact with and (suspected) exposure to covid-19: Secondary | ICD-10-CM | POA: Diagnosis not present

## 2021-05-19 DIAGNOSIS — Z20822 Contact with and (suspected) exposure to covid-19: Secondary | ICD-10-CM | POA: Diagnosis not present

## 2021-05-20 ENCOUNTER — Encounter: Payer: Self-pay | Admitting: Family Medicine

## 2021-05-20 ENCOUNTER — Other Ambulatory Visit: Payer: Medicare Other | Admitting: Family Medicine

## 2021-05-20 VITALS — BP 140/88 | HR 72 | Resp 24

## 2021-05-20 DIAGNOSIS — J9611 Chronic respiratory failure with hypoxia: Secondary | ICD-10-CM | POA: Diagnosis not present

## 2021-05-20 DIAGNOSIS — K224 Dyskinesia of esophagus: Secondary | ICD-10-CM

## 2021-05-20 DIAGNOSIS — Z515 Encounter for palliative care: Secondary | ICD-10-CM

## 2021-05-20 NOTE — Progress Notes (Signed)
? ? ?Manufacturing engineer ?Community Palliative Care Consult Note ?Telephone: [phone]  ?Fax: [phone]  ? ? ?Date of encounter: 05/20/21 ?2:22 PM ?PATIENT NAME: [name] ?[address] ?[place] [address]   ?[phone] (home)  ?DOB: Jan 10, 1946 ?MRN: [id number] ?PRIMARY CARE PROVIDER:    ?Jade Stains, MD,  ?[address] ?[place] [address] ?[phone] ? ?REFERRING PROVIDER:   ?Jade Stains, MD ?[address] ?[address] ?[place],  [place] [address] ?[phone] ? ?RESPONSIBLE PARTY:    ?Contact Information   ? ? Name Relation Home Work Mobile  ? [name] [phone]  [phone]  ? Martinez,Jade Brother   [phone]  ? ?  ? ? ? ?I met face to face with patient and her friend [name] in their home. Palliative Care was asked to follow this patient by consultation request of  Jade Stains, MD to address advance care planning and complex medical decision making. This is a follow up visit. ? ?                                 ASSESSMENT, SYMPTOM MANAGEMENT AND PLAN / RECOMMENDATIONS:  ? Palliative Care Encounter ?Had DNR, created MOST 05/20/2021 ? ?2.  Chronic respiratory failure with O2 ?Encourage continued O2 @ 2L [place] ? ?3. Esophageal dysmotility ?Resolved-evaluated previously and not present ?Question if symptoms related to neck position caused by cervical dystonia. ? ? ?Advance Care Planning/Goals of Care: Goals include to maximize quality of life and symptom management. Discussed with patient and health care surrogate with their permission. ?Our advance care planning conversation included a discussion about:    ?The value and importance of advance care planning  ?Experiences with loved ones who have been seriously ill or have died  ?Exploration of personal, cultural or spiritual beliefs that might influence medical decisions- has faith in God but has concerns about "smothering" to death and would like to be in own home ?Exploration of goals of care  in the event of a sudden injury or illness-DNR ?Identification of a healthcare agent-[name] is her [facility] POA. ?Review of DNR and creation of an advance directive document-MOST ?Decision not to resuscitate or to de-escalate disease focused treatments due to poor prognosis. ?CODE STATUS: ?DNR ?MOST as of 05/20/21: ?DNR/DNI with limited medical intervention ?IVF and antibiotics on case by case basis for time limited period ?No feeding tube ? ? ? ?Follow up Palliative Care Visit: Palliative care will continue to follow for complex medical decision making, advance care planning, and clarification of goals. Return 4 weeks or prn. ? ? ?This visit was coded based on medical decision making (MDM). ? ?PPS: 50% ? ?HOSPICE ELIGIBILITY/DIAGNOSIS: TBD ? ?Chief Complaint:  ?Palliative Care is following for chronic medical management of chronic respiratory failure with dependence on O2 and follow up of advance directives and defining/refining goals of care. ? ?HISTORY OF PRESENT ILLNESS:  [name], goes by "[name]" is a 76 y.o. year old female with chronic respiratory failure due to COPD, chronic combined systolic and diastolic heart failure, HTN, CAD with hx of MI, PVD, carotid artery disease, cervical spinal stenosis/myelopathy/dystonia/cord syndrome, arthritis, hx of vaginal cancer, BiPolar Disorder, and Anxiety and hx of left renal cancer s/p left partial nephrectomy.  No problems with appetite, no worsening SOB, PND or orthopnea.  No fever, increase in productive cough.  Constipation currently under control, no blood in the stool.  No falls.  Has scoliosis and sleeps in recliner.  Pain rating 7 for neck and back.   ? ?History obtained from review of EMR, discussion with primary caregiver-friend [facility] and/or Ms. Buehl.  ?I reviewed available labs, medications, imaging, studies and related documents from the EMR.  Records reviewed and summarized above.  ? ?ROS ?General: NAD ?EYES: denies vision changes ?ENMT: denies  dysphagia ?Cardiovascular: denies chest pain, endorses DOE ?Pulmonary: denies cough, denies increased SOB ?Abdomen: endorses good appetite, denies constipation, endorses continence of bowel ?GU: denies dysuria, endorses continence of urine ?MSK:  denies increased weakness, no falls reported, does have scoliosis which prevents her from laying flat and sleeps in recliner ?Skin: denies rashes or wounds ?Neurological: endorses constant pain in back and neck, denies insomnia ?Psych: Endorses positive mood ?Heme/lymph/immuno: denies bruises, abnormal bleeding ? ?Physical Exam: ?Current and past weights: 190 lbs as of 05/10/21 ?Constitutional: NAD ?General: WD and obese ?EYES: anicteric sclera, lids intact, no discharge  ?ENMT: intact hearing, oral mucous membranes moist, dentition intact ?CV: S1S2, RRR, 1+ non-pitting BLE edema ?Pulmonary: CTAB no increased work of breathing, no cough, has scattered expiratory wheezing  ?Abdomen: normo-active BS + 4 quadrants, soft and non tender, no ascites ?GU: deferred ?MSK: no sarcopenia, moves all extremities, ambulatory ?Skin: warm and dry, no rashes or wounds on visible skin ?Neuro:  no generalized weakness,  no cognitive impairment ?Psych: non-anxious affect, A and O x 3 ?Hem/lymph/immuno: no widespread bruising ? ? ?Thank you for the opportunity to participate in the care of Ms. Kinder.  The palliative care team will continue to follow. Please call our office at [phone] if we can be of additional assistance.  ? ?Marijo Conception, FNP -C ? ?COVID-19 PATIENT SCREENING TOOL ?Asked and negative response unless otherwise noted:  ? ?Have you had symptoms of covid, tested positive or been in contact with someone with symptoms/positive test in the past 5-10 days?  No ? ?

## 2021-05-21 ENCOUNTER — Ambulatory Visit
Admission: RE | Admit: 2021-05-21 | Discharge: 2021-05-21 | Disposition: A | Discharge status: Home / Self Care | Payer: Medicare Other | Source: Ambulatory Visit | Attending: Adult Health | Admitting: Adult Health

## 2021-05-21 DIAGNOSIS — R911 Solitary pulmonary nodule: Secondary | ICD-10-CM

## 2021-05-21 DIAGNOSIS — J439 Emphysema, unspecified: Secondary | ICD-10-CM | POA: Diagnosis not present

## 2021-05-22 ENCOUNTER — Other Ambulatory Visit: Payer: Self-pay | Admitting: Cardiology

## 2021-05-22 DIAGNOSIS — Z515 Encounter for palliative care: Secondary | ICD-10-CM | POA: Insufficient documentation

## 2021-05-24 NOTE — Progress Notes (Signed)
Called and spoke with patient, advised of results/recommendation per [name] NP and that report has been routed to her cardiologist.  She verbalized understanding.  Nothing further needed.

## 2021-05-25 ENCOUNTER — Ambulatory Visit (INDEPENDENT_AMBULATORY_CARE_PROVIDER_SITE_OTHER): Payer: Medicare Other | Admitting: Cardiology

## 2021-05-25 ENCOUNTER — Encounter: Payer: Self-pay | Admitting: Cardiology

## 2021-05-25 VITALS — BP 148/80 | HR 68 | Ht 63.0 in | Wt 191.0 lb

## 2021-05-25 DIAGNOSIS — I25118 Atherosclerotic heart disease of native coronary artery with other forms of angina pectoris: Secondary | ICD-10-CM

## 2021-05-25 DIAGNOSIS — I739 Peripheral vascular disease, unspecified: Secondary | ICD-10-CM | POA: Diagnosis not present

## 2021-05-25 DIAGNOSIS — I700 Atherosclerosis of aorta: Secondary | ICD-10-CM | POA: Insufficient documentation

## 2021-05-25 DIAGNOSIS — J9611 Chronic respiratory failure with hypoxia: Secondary | ICD-10-CM

## 2021-05-25 DIAGNOSIS — J410 Simple chronic bronchitis: Secondary | ICD-10-CM | POA: Insufficient documentation

## 2021-05-25 DIAGNOSIS — I779 Disorder of arteries and arterioles, unspecified: Secondary | ICD-10-CM | POA: Insufficient documentation

## 2021-05-25 DIAGNOSIS — I2510 Atherosclerotic heart disease of native coronary artery without angina pectoris: Secondary | ICD-10-CM | POA: Diagnosis not present

## 2021-05-25 DIAGNOSIS — I5042 Chronic combined systolic (congestive) and diastolic (congestive) heart failure: Secondary | ICD-10-CM

## 2021-05-25 DIAGNOSIS — F17201 Nicotine dependence, unspecified, in remission: Secondary | ICD-10-CM | POA: Insufficient documentation

## 2021-05-25 DIAGNOSIS — R7303 Prediabetes: Secondary | ICD-10-CM | POA: Insufficient documentation

## 2021-05-25 DIAGNOSIS — Z85528 Personal history of other malignant neoplasm of kidney: Secondary | ICD-10-CM | POA: Insufficient documentation

## 2021-05-25 DIAGNOSIS — G250 Essential tremor: Secondary | ICD-10-CM | POA: Insufficient documentation

## 2021-05-25 DIAGNOSIS — D509 Iron deficiency anemia, unspecified: Secondary | ICD-10-CM | POA: Insufficient documentation

## 2021-05-25 MED ORDER — NITROGLYCERIN 0.4 MG SL SUBL: 0.4000 mg | SUBLINGUAL_TABLET | SUBLINGUAL | 3 refills | Status: DC | PRN

## 2021-05-26 DIAGNOSIS — J449 Chronic obstructive pulmonary disease, unspecified: Secondary | ICD-10-CM | POA: Diagnosis not present

## 2021-05-26 DIAGNOSIS — I10 Essential (primary) hypertension: Secondary | ICD-10-CM | POA: Diagnosis not present

## 2021-05-26 DIAGNOSIS — I739 Peripheral vascular disease, unspecified: Secondary | ICD-10-CM | POA: Diagnosis not present

## 2021-05-26 DIAGNOSIS — E785 Hyperlipidemia, unspecified: Secondary | ICD-10-CM | POA: Diagnosis not present

## 2021-05-26 DIAGNOSIS — I700 Atherosclerosis of aorta: Secondary | ICD-10-CM | POA: Diagnosis not present

## 2021-05-26 DIAGNOSIS — I5042 Chronic combined systolic (congestive) and diastolic (congestive) heart failure: Secondary | ICD-10-CM | POA: Diagnosis not present

## 2021-05-26 DIAGNOSIS — I2510 Atherosclerotic heart disease of native coronary artery without angina pectoris: Secondary | ICD-10-CM | POA: Diagnosis not present

## 2021-05-26 DIAGNOSIS — J9611 Chronic respiratory failure with hypoxia: Secondary | ICD-10-CM | POA: Diagnosis not present

## 2021-05-26 DIAGNOSIS — F319 Bipolar disorder, unspecified: Secondary | ICD-10-CM | POA: Diagnosis not present

## 2021-05-28 ENCOUNTER — Telehealth: Payer: Self-pay | Admitting: Nurse Practitioner

## 2021-05-28 NOTE — Telephone Encounter (Signed)
Called patient this morning and she states for the past three days she is having a lot of congestion and is unable to cough it up so its starting to effect her breathing. She is taking Breztri and Albuterol nebs. No fever noted.  ? ?She is wondering if she could take something else for the congestion? ? ?Katie please advise ?

## 2021-05-28 NOTE — Telephone Encounter (Signed)
Pt is having trouble with congestion. she is not able to cough it up ad it is stayingin her throat and lungs. This has been going on for three days. Wonders if there is anything she can take as this is affecting her breathing. [facility] [address].  ?

## 2021-05-28 NOTE — Telephone Encounter (Signed)
Called patient back after receiving recommendations from [place]. Patient verbalized understanding. I advised patient to call back Monday or Tuesday if she was not feeling any better. Patient verbalized understanding. Nothing further needed  ?

## 2021-05-28 NOTE — Telephone Encounter (Signed)
Please advise her to use Mucinex 600 mg Twice daily. If she is already taking 600 mg, she can try increasing to 1200 mg Twice daily if she is able to tolerate for a short period. Advise her to use albuterol neb then use flutter valve afterwards 2-3 times a day. Notify if symptoms do not improve or worsen. Thanks!

## 2021-06-07 ENCOUNTER — Telehealth: Payer: Self-pay | Admitting: Cardiology

## 2021-06-07 MED ORDER — PREDNISONE 10 MG PO TABS: 10.0000 mg | ORAL_TABLET | Freq: Every day | ORAL | 2 refills | Status: DC

## 2021-06-07 NOTE — Telephone Encounter (Signed)
Pt notified of Dr Juline Patch message as noted

## 2021-06-07 NOTE — Telephone Encounter (Signed)
Returned call to pt she states that she was rx'd prednisone '5mg'$  by PULM- Dr Valeta Harms, she states that Dr Martinique increased to '10mg'$ . She is wanting to know how long is she to take the prednisone? Please advise

## 2021-06-07 NOTE — Telephone Encounter (Signed)
Pt c/o medication issue:  1. Name of Medication: predniSONE (DELTASONE) 10 MG tablet  2. How are you currently taking this medication (dosage and times per day)?  Take 1 tablet (10 mg total) by mouth daily with breakfast. 3. Are you having a reaction (difficulty breathing--STAT)?  No  4. What is your medication issue? Pt is wanting to know if she is to stay on medication continuously or not. Please advise

## 2021-06-07 NOTE — Telephone Encounter (Signed)
D/w Martinique she will take this indefinitely. Pt notified. She will need a refill. I will enter refill. Refill entered for Dr Valeta Harms

## 2021-06-14 ENCOUNTER — Other Ambulatory Visit: Payer: Self-pay | Admitting: Student

## 2021-06-14 DIAGNOSIS — I10 Essential (primary) hypertension: Secondary | ICD-10-CM

## 2021-06-17 ENCOUNTER — Encounter: Payer: Self-pay | Admitting: Family Medicine

## 2021-06-17 ENCOUNTER — Other Ambulatory Visit: Payer: Medicare Other | Admitting: Family Medicine

## 2021-06-17 VITALS — BP 120/68 | HR 69 | Resp 22

## 2021-06-17 DIAGNOSIS — J410 Simple chronic bronchitis: Secondary | ICD-10-CM | POA: Diagnosis not present

## 2021-06-17 DIAGNOSIS — J9611 Chronic respiratory failure with hypoxia: Secondary | ICD-10-CM

## 2021-06-17 NOTE — Progress Notes (Signed)
Therapist, nutritional Palliative Care Consult Note Telephone: [phone]  Fax: [phone]    Date of encounter: 06/17/21 3:59 PM PATIENT NAME: [name] [address] [place] [address]   [phone] (home)  DOB: [birth year] MRN: [id number] PRIMARY CARE PROVIDER:    Laurann Montana, MD,  [address] [place] [address] [phone]  REFERRING PROVIDER:   Laurann Montana, MD [phone] [name] [name],  [place] [address] [phone]  RESPONSIBLE PARTY:    Contact Information     Name Relation Home Work Mobile   [name] Friend [phone]  [phone]   [name], [name]   [phone]        I met face to face with patient in her home. Palliative Care was asked to follow this patient by consultation request of  Jade Montana, MD to address advance care planning and complex medical decision making. This is a follow up visit. ASSESSMENT, SYMPTOM MANAGEMENT AND PLAN / RECOMMENDATIONS:   Chronic Hypoxic Respiratory Failure secondary to COPD Encouraged to use Albuterol Q 4-6 hours prn wheezing or SOB, if effective. If having worsening DOE and Albuterol not effective, talk with Pulmonologist about increasing O2 to 3 L with activity. If worsening SOB or wheezing, increased cough production may be starting an exacerbation.    Advance Care Planning/Goals of Care: Goals include to maximize quality of life and symptom management.   CODE STATUS: DNR MOST as of 05/20/21: DNR/DNI with limited medical intervention IVF and antibiotics on case by case basis for time limited period No feeding tube     Follow up Palliative Care Visit: Palliative care will continue to follow for complex medical decision making, advance care planning, and clarification of goals. Return 1 week or prn.    This visit was coded based on medical decision making (MDM).  PPS: 50%  HOSPICE ELIGIBILITY/DIAGNOSIS:  TBD  Chief Complaint:  Palliative Care is following for chronic medical management in setting of chronic hypoxic respiratory failure due to COPD. She c/o worsening SOB.  HISTORY OF PRESENT ILLNESS:  [name] is a 76 y.o. year old female with chronic respiratory failure due to COPD, chronic combined systolic and diastolic heart failure, HTN, CAD with hx of MI, PVD, carotid artery disease, cervical spinal stenosis/myelopathy/dystonia/cord syndrome, arthritis, hx of vaginal cancer, BiPolar Disorder, and Anxiety and hx of left renal cancer s/p left partial nephrectomy.  She c/o increased SOB, DOE, no increase in productive cough.  She c/o right sided CP, possible indigestion.  Sleeping well.  No PND.  Chronically on 2 L [place]. Eating well, no falls.  History obtained from review of EMR, discussion with [name].  I reviewed available labs, medications, imaging, studies and related documents from the EMR.  Records reviewed and summarized above.   ROS  General: NAD, endorses decrease in activity tolerance/increase in fatigue EYES: denies vision changes ENMT: denies dysphagia Cardiovascular: endorses right sided chest pain, endorses increased DOE Pulmonary: denies increase in cough, endorses increased SOB Abdomen: endorses good appetite, denies constipation, endorses continence of bowel GU: denies dysuria, endorses continence of urine MSK:  denies increased weakness, no falls reported Skin: denies rashes or wounds Neurological: denies insomnia, chronic neck/back pain Psych: Endorses positive mood Heme/lymph/immuno: denies bruises, abnormal bleeding  Physical Exam: Current and past weights: 191 lbs as of 05/25/21 Constitutional: NAD General: WD, obese  EYES: anicteric sclera, lids intact, no discharge  ENMT: intact hearing, oral mucous membranes moist, dentition intact CV: S1S2,  RRR, no LE edema Pulmonary: CTA upper lung fields/diminished in right base, expiratory wheeze in left base,  no increased work of breathing, no cough, on 2L Abdomen: normo-active BS + 4 quadrants, soft and non tender, no ascites GU: deferred MSK: no sarcopenia, moves all extremities, minimally ambulatory Skin: warm and dry, no rashes or wounds on visible skin Neuro:  no generalized weakness,  no cognitive impairment Psych: non-anxious affect, A and O x 3 Hem/lymph/immuno: no widespread bruising   Thank you for the opportunity to participate in the care of [name].  The palliative care team will continue to follow. Please call our office at [phone] if we can be of additional assistance.   Marijo Conception, FNP-C  COVID-19 PATIENT SCREENING TOOL Asked and negative response unless otherwise noted:   Have you had symptoms of covid, tested positive or been in contact with someone with symptoms/positive test in the past 5-10 days? No

## 2021-06-18 ENCOUNTER — Telehealth: Payer: Self-pay | Admitting: Nurse Practitioner

## 2021-06-18 MED ORDER — DOXYCYCLINE HYCLATE 100 MG PO TABS: 100.0000 mg | ORAL_TABLET | Freq: Two times a day (BID) | ORAL | 0 refills | Status: DC

## 2021-06-18 NOTE — Telephone Encounter (Signed)
Called and spoke with patient who states that starting yesterday she has been having more shortness of breath. States she has been using her nebulizer at least 3 times a day, Breztri daily, she is taking Prednisone 10 mg daily, 1200 mg mucinex BID. Wants to know what she needs to do   Dr. Valeta Harms please advise

## 2021-06-18 NOTE — Telephone Encounter (Signed)
Called and spoke with patient to give her the recs from Dr. Valeta Harms and get her scheduled for follow up. She states that Pallative care was there yesterday to see her and they are scheduled to come back on 06/23/21. RX has been sent to pharmacy. Nothing further needed at this time. Next Appt With Pulmonology Clayton Bibles, NP) 06/25/2021 at 3:30 PM

## 2021-06-20 ENCOUNTER — Encounter: Payer: Self-pay | Admitting: Family Medicine

## 2021-06-22 ENCOUNTER — Telehealth: Payer: Self-pay | Admitting: Pulmonary Disease

## 2021-06-22 ENCOUNTER — Telehealth (HOSPITAL_COMMUNITY): Payer: Self-pay

## 2021-06-22 NOTE — Telephone Encounter (Signed)
Called and spoke with pt in regards to PR, pt stated "her cardiologist told her she only has about 6 months to a year to live". And is not interested at this time.   Closed referral

## 2021-06-23 ENCOUNTER — Other Ambulatory Visit: Payer: Medicare Other | Admitting: Family Medicine

## 2021-06-23 ENCOUNTER — Other Ambulatory Visit: Payer: Self-pay | Admitting: Pulmonary Disease

## 2021-06-23 VITALS — BP 102/68 | HR 65

## 2021-06-23 DIAGNOSIS — J441 Chronic obstructive pulmonary disease with (acute) exacerbation: Secondary | ICD-10-CM

## 2021-06-23 DIAGNOSIS — J9611 Chronic respiratory failure with hypoxia: Secondary | ICD-10-CM | POA: Diagnosis not present

## 2021-06-23 NOTE — Telephone Encounter (Signed)
Called patient back this morning and noted in her chart that she increased her prednisone to '40mg'$  for 5 days and then she to drop back down to '10mg'$  there after. Patient verbalized understanding. Nothing further.

## 2021-06-23 NOTE — Progress Notes (Signed)
Therapist, nutritional Palliative Care Consult Note Telephone: [phone]  Fax: [phone]    Date of encounter: 06/23/21 3:22 PM PATIENT NAME: [name] [address] [place] [address]   [phone] (home)  DOB: 1945/10/06 MRN: [id number] PRIMARY CARE PROVIDER:    Laurann Montana, MD,  [address] [place] [address] [phone]  REFERRING PROVIDER:   Laurann Montana, MD [phone] [address] [place],  [place] [address] [phone]  RESPONSIBLE PARTY:    Contact Information     Name Relation Home Work Mobile   [place]ifle Friend [phone]  [phone]   Laureen, Frederic   [phone]        I met face to face with patient in her home. Palliative Care was asked to follow this patient by consultation request of  Laurann Montana, MD to address advance care planning and complex medical decision making. This is a follow up visit.  ASSESSMENT, SYMPTOM MANAGEMENT AND PLAN / RECOMMENDATIONS:   Chronic Hypoxic Respiratory Failure secondary to COPD with acute exacerbation Improving. Continue Albuterol nebs Q 4-6 hours prn wheezing or SOB, if effective. Continue Prednisone 40 mg burst dose as prescribed by Pulmonology Complete Doxycycline course.    Advance Care Planning/Goals of Care: Goals include to maximize quality of life and symptom management.   CODE STATUS: DNR MOST as of 05/20/21: DNR/DNI with limited medical intervention IVF and antibiotics on case by case basis for time limited period No feeding tube     Follow up Palliative Care Visit: Palliative care will continue to follow for complex medical decision making, advance care planning, and clarification of goals. Return 4 week or prn.    This visit was coded based on medical decision making (MDM).  PPS: 50%  HOSPICE ELIGIBILITY/DIAGNOSIS: TBD  Chief Complaint:  Palliative Care performing acute visit to follow up chronic  management of worsening dyspnea.  HISTORY OF PRESENT ILLNESS:  [name] is a 76 y.o. year old female with chronic respiratory failure due to COPD, chronic combined systolic and diastolic heart failure, HTN, CAD with hx of MI, PVD, carotid artery disease, cervical spinal stenosis/myelopathy/dystonia/cord syndrome, arthritis, hx of vaginal cancer, BiPolar Disorder, and Anxiety and hx of left renal cancer s/p left partial nephrectomy.  She spoke with Pulmonologist last week who started her on Doxycycline BID. Has had 5 day course of Prednisone 40 mg orally which completes today then she will go back to original dose of Prednisone 10 mg daily.  She states some increase in cough but inability to cough up secretions. Denies fever or chills and states what she coughs up is always yellow.  States improved dyspnea since starting Prednisone and antibiotics but is not back at baseline yet.  Continues on O2 @ 2L [place].  Slightly more tolerant of DOE with recent treatment.  Appetite is good.   History obtained from review of EMR, discussion with Ms. Hooper.  I reviewed available labs, medications, imaging, studies and related documents from the EMR.  Records reviewed and summarized above.   ROS  General: NAD, endorses improved activity tolerance, continued fatigue EYES: denies vision changes ENMT: denies dysphagia Cardiovascular:  denies chest pain, endorses improved DOE  Pulmonary: endorses increased cough but amount unchanged, denies increased SOB Abdomen: endorses good appetite, denies constipation, endorses continence of bowel GU: denies dysuria, endorses continence of urine MSK:  denies increased weakness, no falls reported Skin: denies rashes or wounds Neurological: denies insomnia, chronic neck/back pain Psych: Endorses  positive mood Heme/lymph/immuno: denies bruises, abnormal bleeding  Physical Exam: Current and past weights: 191 lbs as of 05/25/21 Constitutional: NAD General: WD, obese   EYES: anicteric sclera, lids intact, no discharge  ENMT: intact hearing, oral mucous membranes moist, dentition intact CV: S1S2, RRR, no LE edema Pulmonary: CTAB, no increased work of breathing, non-productive cough, on 2L Abdomen: normo-active BS + 4 quadrants, soft and non tender, no ascites GU: deferred MSK: no sarcopenia, moves all extremities, minimally ambulatory Skin: warm and dry, no rashes or wounds on visible skin Neuro:  no generalized weakness,  no cognitive impairment Psych: non-anxious affect, A and O x 3 Hem/lymph/immuno: no widespread bruising   Thank you for the opportunity to participate in the care of [name].  The palliative care team will continue to follow. Please call our office at [phone] if we can be of additional assistance.   Marijo Conception, FNP-C  COVID-19 PATIENT SCREENING TOOL Asked and negative response unless otherwise noted:   Have you had symptoms of covid, tested positive or been in contact with someone with symptoms/positive test in the past 5-10 days? No

## 2021-06-24 ENCOUNTER — Telehealth: Payer: Self-pay | Admitting: Nurse Practitioner

## 2021-06-24 ENCOUNTER — Other Ambulatory Visit: Payer: Self-pay | Admitting: Nurse Practitioner

## 2021-06-24 DIAGNOSIS — J189 Pneumonia, unspecified organism: Secondary | ICD-10-CM

## 2021-06-24 DIAGNOSIS — J9611 Chronic respiratory failure with hypoxia: Secondary | ICD-10-CM

## 2021-06-24 DIAGNOSIS — G518 Other disorders of facial nerve: Secondary | ICD-10-CM | POA: Diagnosis not present

## 2021-06-24 NOTE — Telephone Encounter (Signed)
Called and spoke with patient, advised of recommendations per Roxan Diesel NP.  She verbalized understanding.  Nothing further needed.

## 2021-06-24 NOTE — Telephone Encounter (Signed)
Primary Pulmonologist: [facility] Last office visit and with whom: [facility] What do we see them for (pulmonary problems): COPD Last OV assessment/plan:   Assessment & Plan:        ICD-10-CM    1. COPD, severe ([facility])  J44.9 Amb Referral to Palliative Care     2. Chronic hypoxemic respiratory failure (HCC)  J96.11       3. Lung nodule  R91.1       4. Chronic diastolic congestive heart failure (HCC)  I50.32           Discussion:   This is a 76 year old female, severe centrilobular emphysema, previous mild obstruction on PFTs she has a solitary pulmonary nodule in the left upper lobe as seen on previous CT imaging.  The little bit bigger than.  She does have pretty advanced lung disease.  She is oxygen dependent.  She has had at least 4-5 exacerbations in the past year requiring antibiotics and steroids.  At this point now steroid-dependent.  She is on multiple psych medications which potentially could prolong QT and I do not think the use of azithromycin Monday Wednesday Friday would be appropriate at this time.  Plan: Continue triple therapy inhaler regimen. Refills for Breztri as needed. Continue nebulized treatments 3 times a day. We also talked about palliative care involvement in the outpatient setting. Ambulatory referral to [facility] care for outpatient palliative. Lets drop her oral prednisone dose to 5 mg daily. She can see me or APP in 6 months.  Stay on 5 mg prednisone daily. Patient encouraged to call us and let us know if if symptoms change.  Or she has another exacerbation.       Current Outpatient Medications:    acetaminophen (TYLENOL) 325 MG tablet, Take 2 tablets (650 mg total) by mouth every 4 (four) hours as needed for mild pain ((score 1 to 3) or temp > 100.5)., Disp: , Rfl:    albuterol (PROVENTIL) (2.5 MG/3ML) 0.083% nebulizer solution, Take 3 mLs (2.5 mg total) by nebulization every 6 (six) hours as needed for wheezing or shortness of breath., Disp: 75 mL, Rfl: 12    albuterol (VENTOLIN HFA) 108 (90 Base) MCG/ACT inhaler, Inhale 2 puffs into the lungs every 6 (six) hours as needed for wheezing., Disp: 18 g, Rfl: 1   amLODipine (NORVASC) 5 MG tablet, Take 1 tablet (5 mg total) by mouth daily., Disp: 30 tablet, Rfl: 2   ARIPiprazole (ABILIFY) 10 MG tablet, Take 1 tablet (10 mg total) by mouth daily., Disp: 30 tablet, Rfl: 0   Ascorbic Acid (VITAMIN C) 1000 MG tablet, Take 1,000 mg by mouth daily., Disp: , Rfl:    aspirin EC 81 MG tablet, Take 81 mg by mouth every morning., Disp: , Rfl:    atorvastatin (LIPITOR) 20 MG tablet, Take 1 tablet (20 mg total) by mouth daily., Disp: 90 tablet, Rfl: 3   azithromycin (ZITHROMAX Z-PAK) 250 MG tablet, 2 tablets on the first day then 1 tablet daily until gone, Disp: 6 each, Rfl: 0   bisoprolol (ZEBETA) 5 MG tablet, TAKE 1/2 TABLET BY MOUTH DAILY, Disp: 15 tablet, Rfl: 2   Budeson-Glycopyrrol-Formoterol (BREZTRI AEROSPHERE) 160-9-4.8 MCG/ACT AERO, Inhale 2 puffs into the lungs in the morning and at bedtime., Disp: 10.7 g, Rfl: 5   CADUET 10-20 MG tablet, TAKE 1 TABLET BY MOUTH DAILY., Disp: 90 tablet, Rfl: 0   Calcium Carb-Cholecalciferol 600-400 MG-UNIT TABS, Take 1 tablet by mouth daily., Disp: 60 tablet, Rfl: 0   celecoxib (  CELEBREX) 200 MG capsule, Take 400 mg by mouth daily., Disp: , Rfl:    Cholecalciferol (VITAMIN D3) 50 MCG (2000 UT) TABS, Take 2,000 Units by mouth daily., Disp: 30 tablet, Rfl: 0   clopidogrel (PLAVIX) 75 MG tablet, Take 1 tablet (75 mg total) by mouth daily., Disp: 30 tablet, Rfl: 0   denosumab (PROLIA) 60 MG/ML SOSY injection, Inject 60 mg into the skin every 6 (six) months., Disp: , Rfl:    dexlansoprazole (DEXILANT) 60 MG capsule, Take 1 capsule (60 mg total) by mouth daily., Disp: 30 capsule, Rfl: 0   docusate sodium (COLACE) 100 MG capsule, Take 100 mg by mouth daily as needed for mild constipation., Disp: , Rfl:    empagliflozin (JARDIANCE) 10 MG TABS tablet, Take 1 tablet (10 mg total) by mouth  daily., Disp: 90 tablet, Rfl: 3   ferrous sulfate 325 (65 FE) MG tablet, Take 1 tablet (325 mg total) by mouth daily., Disp: 30 tablet, Rfl: 3   furosemide (LASIX) 20 MG tablet, Take 1 tablet (20 mg total) by mouth daily., Disp: 30 tablet, Rfl: 3   hydrALAZINE (APRESOLINE) 25 MG tablet, Take 1 tablet (25 mg total) by mouth 2 (two) times daily., Disp: 60 tablet, Rfl: 6   lamoTRIgine (LAMICTAL) 100 MG tablet, Take 2 tablets (200 mg total) by mouth 2 (two) times daily., Disp: 120 tablet, Rfl: 0   montelukast (SINGULAIR) 10 MG tablet, Take 1 tablet (10 mg total) by mouth at bedtime. TAKE 1 TABLET BY MOUTH EVERYDAY AT BEDTIME (Patient taking differently: Take 10 mg by mouth at bedtime.), Disp: 90 tablet, Rfl: 1   Multiple Vitamin (MULTIVITAMIN) capsule, Take 1 capsule by mouth daily. , Disp: , Rfl:    nitroGLYCERIN (NITROSTAT) 0.4 MG SL tablet, Place 1 tablet (0.4 mg total) under the tongue every 5 (five) minutes as needed for chest pain., Disp: 25 tablet, Rfl: 3   Omega-3 Fatty Acids (FISH OIL) 1000 MG CAPS, Take 1,000 mg by mouth 2 (two) times daily., Disp: , Rfl:    Oxycodone HCl 10 MG TABS, Take 1 tablet (10 mg total) by mouth every 4 (four) hours as needed for severe pain., Disp: 30 tablet, Rfl: 0   Polyethyl Glycol-Propyl Glycol (SYSTANE) 0.4-0.3 % GEL ophthalmic gel, Place 1 application into both eyes daily as needed (dry eyes)., Disp: , Rfl:    potassium chloride (KLOR-CON) 8 MEQ tablet, Take 1 tablet (8 mEq total) by mouth daily., Disp: 30 tablet, Rfl: 6   predniSONE (DELTASONE) 10 MG tablet, Take 1 tablet (10 mg total) by mouth daily with breakfast., Disp: 30 tablet, Rfl: 0   roflumilast (DALIRESP) 500 MCG TABS tablet, Take 0.5 tablets (250 mcg total) by mouth daily for 28 days, THEN 1 tablet (500 mcg total) daily., Disp: 30 tablet, Rfl: 5   sacubitril-valsartan (ENTRESTO) 49-51 MG, Take 1 tablet by mouth 2 (two) times daily., Disp: 60 tablet, Rfl:    sertraline (ZOLOFT) 100 MG tablet, Take 150  mg by mouth daily. Take 150 mg per patient., Disp: , Rfl:    tiZANidine (ZANAFLEX) 4 MG tablet, Take 1 tablet (4 mg total) by mouth every 6 (six) hours., Disp: 120 tablet, Rfl: 0        Garner Nash, DO [facility] 05/10/2021 10:37 AM         Patient Instructions by Garner Nash, DO at 05/10/2021 10:15 AM  Author: Garner Nash, DO Author Type: Physician Filed: 05/10/2021 10:36 AM  Note Status:  Signed Cosign: Cosign Not Required Encounter Date: 05/10/2021  Editor: Garner Nash, DO (Physician)               Thank you for visiting Dr. Valeta Harms at [facility]. Today we recommend the following:      Orders Placed This Encounter  Procedures   Amb Referral to Palliative Care    Drop to 5 mg prednisone daily, cut the '10mg'$  tab in half.   Return in about 6 months (around 11/09/2021) for with APP or Dr. Valeta Harms.      Please do your part to reduce the spread of COVID-19.          Orthostatic Vitals Recorded in This Encounter   05/10/2021  1018     Patient Position: Sitting  BP Location: Left Arm  Cuff Size: Normal   Instructions    Return in about 6 months (around 11/09/2021) for with APP or Dr. Valeta Harms.  Thank you for visiting Dr. Valeta Harms at [facility]. Today we recommend the following:      Orders Placed This Encounter  Procedures   Amb Referral to Palliative Care    Drop to 5 mg prednisone daily, cut the '10mg'$  tab in half.   Return in about 6 months (around 11/09/2021) for with APP or Dr. Valeta Harms.      Was appointment offered to patient (explain)?  Sees Katie on 06/25/21   Reason for call: She is having more sob yesterday and today.  She is coughing, but is unable to cough up the mucous.  She did not use her flutter valve yesterday, she did not think of it, but will use it today.  She did not use oxygen this morning while going to the bathroom and sats dropped to 88% on RA.  Discussed using oxygen at all times, especially when  exerting herself.  She was put on Doxycycline and is still on it twice a day, she is also on prednisone 4 per day and was to drop down to 2 per day today, but she would like to stay on the 4 tabs per day as she feels it is helping with her breathing.  Advised I would send a message to [facility] and one of [place] would call her back once we hear back from [place].  She stated she has a doctor's appointment today at 10:30 am so to call her back on her cel phone:  [phone].  Katie, please advise if ok for patient to remain on 4 tabs of prednisone as she feels it is helping her breathing.  She sees you tomorrow in the office.  Thank you.  (examples of things to ask: : When did symptoms start? Fever? Cough? Productive? Color to sputum? More sputum than usual? Wheezing? Have you needed increased oxygen? Are you taking your respiratory medications? What over the counter measures have you tried?)  Allergies  Allergen Reactions   Cephalexin Diarrhea    Other reaction(s): diarrhea   Doxycycline Diarrhea   Gabapentin Other (See Comments)    sleepiness   Morphine And Related Anxiety    Was confused when taking it    Immunization History  Administered Date(s) Administered   Fluad Quad(high Dose 65+) 11/01/2018   Influenza Split 01/18/2008, 11/10/2009   Influenza, High Dose Seasonal PF 09/30/2011   Influenza,inj,quad, With Preservative 10/17/2016   Influenza-Unspecified 12/07/2010, 09/17/2013, 11/16/2020   Moderna Sars-Covid-2 Vaccination 03/01/2019, 03/29/2019, 12/02/2019   OPV 08/24/2020   Pneumococcal Conjugate-13 08/23/2018   Pneumococcal Polysaccharide-23 04/12/2010  Pneumococcal-Unspecified 01/18/2008, 04/12/2010   Tdap 04/25/2011   Zoster Recombinat (Shingrix) 08/06/2017   Zoster, Live 04/28/2010, 08/06/2017

## 2021-06-24 NOTE — Progress Notes (Signed)
CXR prior to OV per Dr. Valeta Harms last note.

## 2021-06-24 NOTE — Telephone Encounter (Signed)
She can remain on 4 tabs daily until I see her tomorrow and then we will discuss next steps. Please advise she seek emergency care if her breathing worsens or unable to maintain oxygen levels >88% on oxygen prior to our appointment. Thanks.

## 2021-06-25 ENCOUNTER — Ambulatory Visit (INDEPENDENT_AMBULATORY_CARE_PROVIDER_SITE_OTHER): Payer: Medicare Other

## 2021-06-25 ENCOUNTER — Ambulatory Visit (INDEPENDENT_AMBULATORY_CARE_PROVIDER_SITE_OTHER): Payer: Medicare Other | Admitting: Nurse Practitioner

## 2021-06-25 ENCOUNTER — Encounter: Payer: Self-pay | Admitting: Nurse Practitioner

## 2021-06-25 VITALS — BP 120/78 | HR 68 | Temp 98.3°F | Ht 63.0 in | Wt 195.0 lb

## 2021-06-25 DIAGNOSIS — J189 Pneumonia, unspecified organism: Secondary | ICD-10-CM | POA: Diagnosis not present

## 2021-06-25 DIAGNOSIS — J9611 Chronic respiratory failure with hypoxia: Secondary | ICD-10-CM | POA: Diagnosis not present

## 2021-06-25 DIAGNOSIS — R0602 Shortness of breath: Secondary | ICD-10-CM | POA: Diagnosis not present

## 2021-06-25 DIAGNOSIS — J449 Chronic obstructive pulmonary disease, unspecified: Secondary | ICD-10-CM

## 2021-06-25 DIAGNOSIS — J441 Chronic obstructive pulmonary disease with (acute) exacerbation: Secondary | ICD-10-CM

## 2021-06-25 DIAGNOSIS — J439 Emphysema, unspecified: Secondary | ICD-10-CM | POA: Diagnosis not present

## 2021-06-25 MED ORDER — BUDESONIDE 0.5 MG/2ML IN SUSP: 0.5000 mg | Freq: Two times a day (BID) | RESPIRATORY_TRACT | 3 refills | Status: AC

## 2021-06-25 MED ORDER — ARFORMOTEROL TARTRATE 15 MCG/2ML IN NEBU
15.0000 ug | INHALATION_SOLUTION | Freq: Two times a day (BID) | RESPIRATORY_TRACT | 3 refills | Status: AC
Start: 2021-06-25 — End: ?

## 2021-06-25 MED ORDER — REVEFENACIN 175 MCG/3ML IN SOLN: 175.0000 ug | Freq: Every day | RESPIRATORY_TRACT | 3 refills | Status: AC

## 2021-06-25 NOTE — Patient Instructions (Addendum)
We will switch your maintenance therapy to all nebulized treatments.  Continue your Breztri 2 puffs twice a day until you receive these in the mail.  Once received, please start -Brovana 2 mL neb twice daily -Budesonide 2 mL neb twice daily.  Brush tongue and rinse mouth afterwards.  Can combine with Brovana -Yupelri 3 mL nebs once daily  Continue Albuterol inhaler 2 puffs or 3 mL neb every 4-6 hours as needed for shortness of breath or wheezing. Notify if symptoms persist despite rescue inhaler/neb use. Continue supplemental oxygen 2 lpm to maintain oxygen levels >88-90% Continue singulair 10 mg At bedtime  Continue daliresp 500 mcg daily  Begin to taper your prednisone tomorrow. 3 tabs for 3 days, 2 tabs for 3 days, then return to your 10 mg daily dose. Mucinex [phone] mg Twice daily for chest congestion Flutter valve 2-3 times a day   Follow up in one month with Katie Anamari Galeas,NP and Dr. Valeta Harms in 4 months. If symptoms do not improve or worsen, please contact office for sooner follow up or seek emergency care.

## 2021-06-25 NOTE — Assessment & Plan Note (Signed)
Slow to resolve AECOPD.  She has advanced lung disease with high symptom burden and prone to frequent exacerbations. We will begin to taper her off her 40 mg of prednisone until she gets to 10 mg daily. Recommended that we transition her to triple therapy nebs as I am not sure that she is able to generate enough airflow for inhaler at this point.  Continue Daliresp daily and as needed albuterol.  CXR today without superimposed infection.  Patient Instructions  We will switch your maintenance therapy to all nebulized treatments.  Continue your Breztri 2 puffs twice a day until you receive these in the mail.  Once received, please start -Brovana 2 mL neb twice daily -Budesonide 2 mL neb twice daily.  Brush tongue and rinse mouth afterwards.  Can combine with Brovana -Yupelri 3 mL nebs once daily  Continue Albuterol inhaler 2 puffs or 3 mL neb every 4-6 hours as needed for shortness of breath or wheezing. Notify if symptoms persist despite rescue inhaler/neb use. Continue supplemental oxygen 2 lpm to maintain oxygen levels >88-90% Continue singulair 10 mg At bedtime  Continue daliresp 500 mcg daily  Begin to taper your prednisone tomorrow. 3 tabs for 3 days, 2 tabs for 3 days, then return to your 10 mg daily dose. Mucinex [phone] mg Twice daily for chest congestion Flutter valve 2-3 times a day   Follow up in one month with Katie Venessa Wickham,NP and Dr. Valeta Harms in 4 months. If symptoms do not improve or worsen, please contact office for sooner follow up or seek emergency care.

## 2021-06-25 NOTE — Assessment & Plan Note (Signed)
Stable with no increased O2 demand.  She remains on 2 L/min supplemental O2.  Advised her to monitor at home for goal >88-90%

## 2021-06-25 NOTE — Progress Notes (Signed)
$'@Patient'a$  ID: [name], female    DOB: [birth year], 76 y.o.   MRN: [id number]  Chief Complaint  Patient presents with   Follow-up    She was told that she had some wheezing and her hospice nurse told her she was squeaking.     Referring provider: Harlan Stains, MD  HPI: 76 year old female, former smoker (100 pack years) followed for COPD with emphysema and chronic respiratory failure on supplemental O2. She is a patient of Dr. Juline Patch and last seen in office on 05/10/2021. She also participates in the lung cancer screening program. Past medical history significant for CAD, history of NSTEMI, CHF, hypertension, GERD, arthritis, anxiety and depression, bipolar disorder, alcoholism.  TEST/EVENTS:  02/06/2021 CT chest with contrast: Extensive CAD and atherosclerosis as well as cardiomegaly.  Moderate to severe emphysema was present.  There was mild groundglass opacities interstitial opacities within both lungs, relatively unchanged concerning for pneumonia versus edema.  1.5 cm nodular opacity at left lower lobe with increased atelectasis. 02/15/2021 CXR 2 view: Reticular opacities noted at posterior lung base, concerning for multifocal pneumonia.  Potentially related to aspiration.  Emphysema present with increased retrosternal airspace, flattened hemidiaphragms and increased AP diameter with hyperinflation 03/11/2021 CXR 2 view: Cardiac silhouette mildly enlarged.  Lungs were noted to be hyperinflated with chronic interstitial coarsening and underlying emphysema.  Improved aeration of lung bases.  No evidence of acute airspace consolidation. 03/11/2021 CTA chest: Atherosclerosis.  Background of severe emphysema.  Patchy airspace opacities in the lateral LLL, likely subsegmental atelectasis.  Previous nodular opacity in medial LLL is decreasing conspicuity and is no longer masslike in appearance.  However there are nodular opacities in the posterior portion of the RLL which are increasing in size  when compared to previous.  Largest single nodule measures up to approximately 8 mm.  Infectious versus inflammatory.  No evidence of PE. 04/01/2021 swallow study: No evidence of aspiration.  Did have some evidence of esophageal dysmotility 04/01/2021 CXR 2 view: Mild hyperinflation of lungs with diffuse chronic prominent interstitial lung markings.  No acute process noted. 04/01/2021 sputum culture: Normal oropharyngeal flora  02/19/2021: OV with Parrett NP for hospital follow-up.  Seen by cardiology earlier in the week and changed from [facility] to [facility].  Ongoing cough and DOE at visit.  Started on prednisone after calling in office.  CXR from 130 with reticular opacities along the posterior lung bases and a 1.5 cm nodular opacity LLL.  Treated with a 7-day course of Augmentin and slow prednisone taper.  Provided with extra dose of Lasix for 2 days given significant elevation in BNP during hospitalization and increased weight.  Repeat CT in 3 months for nodule follow-up   03/01/2021: OV with Parrett NP for follow-up.  AECOPD resolved with improvement in symptoms.  Continued on triple therapy respiratory and supplemental O2.  Repeat CXR in 4 to 6 weeks for evaluation of pneumonia  03/11/2021: OV with Sevin Langenbach NP for worsening shortness of breath.  Takes a significant amount of time to catch breath after using restroom.  Also had some new upper back pain worse with breathing.  Cough is improved with minimal sputum production.  No increasing oxygen requirements.  Concern for possible PE or superimposed infection given worsening shortness of breath and pleuritic pain.  CTA was ordered to rule out PE which was negative.  Did show some increased clustered opacities in the right lower lobe, infectious versus inflammatory.  She was treated with a prednisone taper as she  had already completed 2 antibiotic courses.  Advised to notify if symptoms do not improve   03/19/2021: OV with Parrett NP for close follow-up.  She has  been having a slow to resolve COPD exacerbation after pneumonia.  Awaiting swallow study due to findings on CT scan which was ordered at last visit.  Felt much better at visit and reported that it was one of her best days in a long time.  No increasing oxygen requirements continue triple therapy inhaler and supplemental O2.  Plans to follow up in 3 weeks.  CT ordered for 3 months from previous  04/01/2021: OV with Tytionna Cloyd NP for acute visit.  Worsening shortness of breath and increased pain in her back with deep breathing.  Had contacted the office on 03/26/2021 concerned that she may need another antibiotic-Rx for Levaquin was sent for 7 days.  At [place] reported feeling relatively the same with productive cough with white to beige sputum production.  No increasing oxygen demand.  CXR without evidence of superimposed infection.  Sputum culture with normal flora.  Prednisone taper prescribed.  Close follow-up.  04/07/2021: OV with Christiana Gurevich NP for follow up. She feels better than she did last week but continues to have some DOE. Her oxygen levels have been stable and she is able to maintain on 2 lpm. She does continue to have a productive cough with white to beige sputum, which is mostly at her baseline. Occasional wheeze. No fevers or chills. Completed levaquin without difficulties. Remains on prednisone taper and continues on Breztri. She is using her nebs twice daily. She was started on daliresp for frequent exacerbations. CXR without evidence of superimposed infection. Pt called sometime after and had worsening SOB so was started on another prednisone taper and then transitioned to daily prednisone.   05/10/2021: OV with Dr. Valeta Harms. Breathing improved. Still has high symptom burden. On multiple psych medications so did not recommend use of chronic macrolide d/t risk for prolonged QT.  Continued on triple therapy regimen with Breztri.  Referred to [facility] for palliative care given her advanced lung disease.  Dropped her  oral prednisone to 5 mg daily.  06/25/2021: Today-acute visit Patient presents today with close friend for AECOPD.  She had contacted the office on 6/2 with increased shortness of breath, requiring frequent use of her nebulizer.  She was prescribed prednisone 40 mg x 5 days and doxycycline course.  She was recommended to come in for visit with chest x-ray.  She then contacted the office again yesterday due to increased shortness of breath after decreasing her prednisone.  She reported feeling better on the 40 mg and asked if she could remain on this.  She was advised to stay on '40mg'$  until she was seen in office.  Today, she reports feeling some better.  Feels like her breathing really waxes and wanes.  Cough is relatively unchanged from her baseline.  She did report that the palliative care nurse practitioner had noted that she had been wheezing the other day.  She does feel like this has improved some.  She denies any fevers, hemoptysis, weight loss, lower extremity edema, anorexia.  She continues on Buna twice daily.  She is using her nebs a few times a day and feels like these work much better for her.  She continues on Daliresp daily and supplemental oxygen.  She has not had any increased O2 demand.  Allergies  Allergen Reactions   Cephalexin Diarrhea    Other reaction(s): diarrhea   Doxycycline  Diarrhea   Gabapentin Other (See Comments)    sleepiness   Morphine And Related Anxiety    Was confused when taking it    Immunization History  Administered Date(s) Administered   Fluad Quad(high Dose 65+) 11/01/2018   Influenza Split 01/18/2008, 11/10/2009   Influenza, High Dose Seasonal PF 09/30/2011   Influenza,inj,quad, With Preservative 10/17/2016   Influenza-Unspecified 12/07/2010, 09/17/2013, 11/16/2020   Moderna Sars-Covid-2 Vaccination 03/01/2019, 03/29/2019, 12/02/2019   OPV 08/24/2020   Pneumococcal Conjugate-13 08/23/2018   Pneumococcal Polysaccharide-23 04/12/2010    Pneumococcal-Unspecified 01/18/2008, 04/12/2010   Tdap 04/25/2011   Zoster Recombinat (Shingrix) 08/06/2017   Zoster, Live 04/28/2010, 08/06/2017    Past Medical History:  Diagnosis Date   (HFpEF) heart failure with preserved ejection fraction ([place]) 02/24/2017   Echo 05/2020: EF 55, severe inferior HK, mild LVH, Gr1 DD, normal RVSF, RVSP 32.4, mild BAE, mild MR, AV sclerosis without stenosis   Abdominal pain, acute, epigastric 03/02/2012   Acute congestive heart failure ([place]) 02/24/2017   Alcoholism ([place])    recovering since 2000   Anxiety and depression    Arthritis BACK   Bipolar disorder (HCC)    Blepharospasm LEFT EYE   CAP (community acquired pneumonia) 03/01/2021   Chronic back pain    Closed fracture of head of left radius 06/29/2017   COPD (chronic obstructive pulmonary disease) ([place])    Coronary artery disease    s/p Inf MI in 2000 tx with PCI to RCA // Myoview in 2019 with inf-lat scar and mild peri-infarct ischemia   Emphysema    GERD (gastroesophageal reflux disease)    History of alcohol abuse RECOVERING SINCE 2000   History of Left renal mass 03/02/2012   underwent partial nephrectomy   HTN (hypertension)    Idiopathic acute facial nerve palsy LEFT SIDE--  BOTOX THERAPY   Inferior MI ([place]) 2000--  POST PTCA W/ STENT X1   Ischemic cardiomyopathy    EF returned to normal   Osteoporosis    Other and unspecified general anesthetics causing adverse effect in therapeutic use post op delirium--  last anes record w/ chart  from   09-22-2009 (spinal w/ light sedation)   Peripheral vascular disease ([place]) POST RIGHT CAROTID SURG.  1995   Preoperative clearance 06/13/2012   Renal cell carcinoma ([place]) 07/09/2012   Left mass   Rosacea LEFT FACIAL RASH   S/P radiation therapy 02/21/2012   38.75 Gy HDR 5 Fractions- vaginal cuff   Scoliosis    Seizures (HCC)    x 1 after abrupt discontinuation of  Clonidine   Status post carotid endarterectomy RIGHT --  1995   Status post primary  angioplasty with coronary stent 2000--  POST INFERIOR MI   Unstable balance WALKS W/ CANE   Vaginal cancer ([place])     Tobacco History: Social History   Tobacco Use  Smoking Status Former   Packs/day: 2.00   Years: 50.00   Total pack years: 100.00   Types: Cigarettes   Quit date: 01/17/2010   Years since quitting: 11.4  Smokeless Tobacco Never  Tobacco Comments   STATES QUIT SMOKING 01-17-2010   Counseling given: Not Answered Tobacco comments: STATES QUIT SMOKING 01-17-2010   Outpatient Medications Prior to Visit  Medication Sig Dispense Refill   acetaminophen (TYLENOL) 325 MG tablet Take 2 tablets (650 mg total) by mouth every 4 (four) hours as needed for mild pain ((score 1 to 3) or temp > 100.5).     albuterol (PROVENTIL) (2.5 MG/3ML) 0.083% nebulizer  solution INAHLE 3 ML'S ( 1 VIAL) VIA NEBULIZER EVERY 6 HOURS AS NEEDED FOR WHEEZING OR FOR SHORTNESS OF BREATH 75 mL 12   albuterol (VENTOLIN HFA) 108 (90 Base) MCG/ACT inhaler Inhale 2 puffs into the lungs every 6 (six) hours as needed for wheezing. 18 g 1   amLODipine (NORVASC) 5 MG tablet TAKE ONE TABLET BY MOUTH DAILY 30 tablet 2   ARIPiprazole (ABILIFY) 10 MG tablet Take 1 tablet (10 mg total) by mouth daily. 30 tablet 0   Ascorbic Acid (VITAMIN C) 1000 MG tablet Take 1,000 mg by mouth daily.     aspirin EC 81 MG tablet Take 81 mg by mouth every morning.     atorvastatin (LIPITOR) 20 MG tablet Take 1 tablet (20 mg total) by mouth daily. 90 tablet 3   bisoprolol (ZEBETA) 5 MG tablet TAKE 1/2 TABLET BY MOUTH DAILY 15 tablet 2   CADUET 10-20 MG tablet TAKE 1 TABLET BY MOUTH DAILY. 90 tablet 0   Calcium Carb-Cholecalciferol 600-400 MG-UNIT TABS Take 1 tablet by mouth daily. 60 tablet 0   celecoxib (CELEBREX) 200 MG capsule Take 400 mg by mouth daily.     Cholecalciferol (VITAMIN D3) 50 MCG (2000 UT) TABS Take 2,000 Units by mouth daily. 30 tablet 0   clopidogrel (PLAVIX) 75 MG tablet Take 1 tablet (75 mg total) by mouth daily. 30  tablet 0   denosumab (PROLIA) 60 MG/ML SOSY injection Inject 60 mg into the skin every 6 (six) months.     dexlansoprazole (DEXILANT) 60 MG capsule Take 1 capsule (60 mg total) by mouth daily. 30 capsule 0   docusate sodium (COLACE) 100 MG capsule Take 100 mg by mouth daily as needed for mild constipation.     empagliflozin (JARDIANCE) 10 MG TABS tablet Take 1 tablet (10 mg total) by mouth daily. 90 tablet 3   ferrous sulfate 325 (65 FE) MG tablet Take 1 tablet (325 mg total) by mouth daily. 30 tablet 3   furosemide (LASIX) 20 MG tablet Take 1 tablet (20 mg total) by mouth daily. 30 tablet 3   hydrALAZINE (APRESOLINE) 25 MG tablet TAKE ONE TABLET BY MOUTH TWICE A DAY 60 tablet 6   lamoTRIgine (LAMICTAL) 100 MG tablet Take 2 tablets (200 mg total) by mouth 2 (two) times daily. 120 tablet 0   montelukast (SINGULAIR) 10 MG tablet Take 1 tablet (10 mg total) by mouth at bedtime. TAKE 1 TABLET BY MOUTH EVERYDAY AT BEDTIME 90 tablet 1   Multiple Vitamin (MULTIVITAMIN) capsule Take 1 capsule by mouth daily.      nitroGLYCERIN (NITROSTAT) 0.4 MG SL tablet Place 1 tablet (0.4 mg total) under the tongue every 5 (five) minutes as needed for chest pain. 25 tablet 3   Omega-3 Fatty Acids (FISH OIL) 1000 MG CAPS Take 1,000 mg by mouth 2 (two) times daily.     Oxycodone HCl 10 MG TABS Take 1 tablet (10 mg total) by mouth every 4 (four) hours as needed for severe pain. 30 tablet 0   Polyethyl Glycol-Propyl Glycol (SYSTANE) 0.4-0.3 % GEL ophthalmic gel Place 1 application into both eyes daily as needed (dry eyes).     potassium chloride (KLOR-CON) 8 MEQ tablet Take 1 tablet (8 mEq total) by mouth daily. 30 tablet 6   predniSONE (DELTASONE) 10 MG tablet Take 1 tablet (10 mg total) by mouth daily with breakfast. 30 tablet 2   roflumilast (DALIRESP) 500 MCG TABS tablet Take 0.5 tablets (250 mcg total) by mouth  daily for 28 days, THEN 1 tablet (500 mcg total) daily. 30 tablet 5   sacubitril-valsartan (ENTRESTO) 49-51  MG Take 1 tablet by mouth 2 (two) times daily. 60 tablet    sertraline (ZOLOFT) 100 MG tablet Take 150 mg by mouth daily. Take 150 mg per patient.     tiZANidine (ZANAFLEX) 4 MG tablet Take 1 tablet (4 mg total) by mouth every 6 (six) hours. 120 tablet 0   Budeson-Glycopyrrol-Formoterol (BREZTRI AEROSPHERE) 160-9-4.8 MCG/ACT AERO Inhale 2 puffs into the lungs in the morning and at bedtime. 10.7 g 5   doxycycline (VIBRA-TABS) 100 MG tablet Take 1 tablet (100 mg total) by mouth 2 (two) times daily. (Patient not taking: Reported on 06/25/2021) 14 tablet 0   No facility-administered medications prior to visit.     Review of Systems:   Constitutional: No weight loss or gain, night sweats, fevers, chills, fatigue, or lassitude. HEENT: No headaches, difficulty swallowing, tooth/dental problems, or sore throat. No sneezing, itching, ear ache, nasal congestion, or post nasal drip CV:  No chest pain, orthopnea, PND, swelling in lower extremities, anasarca, dizziness, palpitations, syncope Resp: +shortness of breath with exertion; chronic cough; occasional wheeze. No excess mucus or change in color of mucus. No hemoptysis.  No chest wall deformity Skin: No rash, lesions, ulcerations MSK:  No joint pain or swelling.  No decreased range of motion.  No back pain. Neuro: No dizziness or lightheadedness.  Psych: No depression or anxiety. Mood stable.     Physical Exam:  BP 120/78 (BP Location: Left Arm, Cuff Size: Large)   Pulse 68   Temp 98.3 F (36.8 C) (Oral)   Ht '5\' 3"'$  (1.6 m)   Wt 195 lb (88.5 kg)   SpO2 91%   BMI 34.54 kg/m   GEN: Pleasant, interactive,chronically-ill appearing; obese; in no acute distress. HEENT:  Normocephalic and atraumatic. PERRLA. Sclera white. Nasal turbinates pink, moist and patent bilaterally. No rhinorrhea present. Oropharynx pink and moist, without exudate or edema. No lesions, ulcerations, or postnasal drip.  NECK:  Supple w/ fair ROM. No JVD present. Normal  carotid impulses w/o bruits. Thyroid symmetrical with no goiter or nodules palpated. No lymphadenopathy.   CV: RRR, no m/r/g, no peripheral edema. Pulses intact, +2 bilaterally. No cyanosis, pallor or clubbing. PULMONARY:  Unlabored, regular breathing. Diminished A&P w/o wheezes/rales/rhonchi. No accessory muscle use. No dullness to percussion. GI: BS present and normoactive. Soft, non-tender to palpation.  MSK: No erythema, warmth or tenderness. Cap refil <2 sec all extrem. No deformities or joint swelling noted.  Neuro: A/Ox3. No focal deficits noted.   Skin: Warm, no lesions or rashe Psych: Normal affect and behavior. Judgement and thought content appropriate.     Lab Results:  CBC    Component Value Date/Time   WBC 10.3 02/16/2021 0910   WBC 11.6 (H) 02/08/2021 1604   RBC 4.05 02/16/2021 0910   RBC 3.85 (L) 02/08/2021 1604   HGB 13.9 02/16/2021 0910   HCT 37.7 02/16/2021 0910   PLT 283 02/16/2021 0910   MCV 93 02/16/2021 0910   MCH 34.3 (H) 02/16/2021 0910   MCH 34.5 (H) 02/08/2021 1604   MCHC 36.9 (H) 02/16/2021 0910   MCHC 36.0 02/08/2021 1604   RDW 12.2 02/16/2021 0910   LYMPHSABS 0.7 02/16/2021 0910   MONOABS 0.8 09/03/2020 0500   EOSABS 0.1 02/16/2021 0910   BASOSABS 0.0 02/16/2021 0910    BMET    Component Value Date/Time   NA 136 04/07/2021 1058  NA 137 02/24/2021 1100   K 4.0 04/07/2021 1058   CL 102 04/07/2021 1058   CO2 26 04/07/2021 1058   GLUCOSE 116 (H) 04/07/2021 1058   BUN 16 04/07/2021 1058   BUN 18 02/24/2021 1100   CREATININE 0.91 04/07/2021 1058   CALCIUM 10.0 04/07/2021 1058   GFRNONAA 54 (L) 02/09/2021 0438   GFRAA 73 03/06/2019 1649    BNP    Component Value Date/Time   BNP 1,642.5 (H) 02/07/2021 0416     Imaging:  No results found.        Latest Ref Rng & Units 07/02/2019    9:50 AM  PFT Results  FVC-Pre L 2.74   FVC-Predicted Pre % 100   FVC-Post L 2.71   FVC-Predicted Post % 99   Pre FEV1/FVC % % 68   Post  FEV1/FCV % % 69   FEV1-Pre L 1.86   FEV1-Predicted Pre % 91   FEV1-Post L 1.87   DLCO uncorrected ml/min/mmHg 9.87   DLCO UNC% % 53   DLCO corrected ml/min/mmHg 9.87   DLCO COR %Predicted % 53   DLVA Predicted % 55   TLC L 4.52   TLC % Predicted % 92   RV % Predicted % 84     No results found for: "NITRICOXIDE"      Assessment & Plan:   COPD with acute exacerbation (HCC) Slow to resolve AECOPD.  She has advanced lung disease with high symptom burden and prone to frequent exacerbations. We will begin to taper her off her 40 mg of prednisone until she gets to 10 mg daily. Recommended that we transition her to triple therapy nebs as I am not sure that she is able to generate enough airflow for inhaler at this point.  Continue Daliresp daily and as needed albuterol.  CXR today without superimposed infection.  Patient Instructions  We will switch your maintenance therapy to all nebulized treatments.  Continue your Breztri 2 puffs twice a day until you receive these in the mail.  Once received, please start -Brovana 2 mL neb twice daily -Budesonide 2 mL neb twice daily.  Brush tongue and rinse mouth afterwards.  Can combine with Brovana -Yupelri 3 mL nebs once daily  Continue Albuterol inhaler 2 puffs or 3 mL neb every 4-6 hours as needed for shortness of breath or wheezing. Notify if symptoms persist despite rescue inhaler/neb use. Continue supplemental oxygen 2 lpm to maintain oxygen levels >88-90% Continue singulair 10 mg At bedtime  Continue daliresp 500 mcg daily  Begin to taper your prednisone tomorrow. 3 tabs for 3 days, 2 tabs for 3 days, then return to your 10 mg daily dose. Mucinex [phone] mg Twice daily for chest congestion Flutter valve 2-3 times a day   Follow up in one month with Katie Elon Lomeli,NP and Dr. Valeta Harms in 4 months. If symptoms do not improve or worsen, please contact office for sooner follow up or seek emergency care.     Chronic respiratory failure with  hypoxia (HCC) Stable with no increased O2 demand.  She remains on 2 L/min supplemental O2.  Advised her to monitor at home for goal >88-90%    I spent 35 minutes of dedicated to the care of this patient on the date of this encounter to include pre-visit review of records, face-to-face time with the patient discussing conditions above, post visit ordering of testing, clinical documentation with the electronic health record, making appropriate referrals as documented, and communicating necessary findings to members  of the patients care team.  Clayton Bibles, NP 06/25/2021  Pt aware and understands NP's role.

## 2021-06-26 ENCOUNTER — Encounter: Payer: Self-pay | Admitting: Family Medicine

## 2021-06-26 ENCOUNTER — Inpatient Hospital Stay (HOSPITAL_COMMUNITY)
Admission: EM | Admit: 2021-06-26 | Discharge: 2021-06-28 | DRG: 177 | Disposition: A | Discharge status: Hospice | Payer: Medicare Other | Attending: Family Medicine | Admitting: Family Medicine

## 2021-06-26 ENCOUNTER — Encounter (HOSPITAL_COMMUNITY): Payer: Self-pay | Admitting: Internal Medicine

## 2021-06-26 ENCOUNTER — Emergency Department (HOSPITAL_COMMUNITY): Payer: Medicare Other

## 2021-06-26 DIAGNOSIS — M479 Spondylosis, unspecified: Secondary | ICD-10-CM | POA: Diagnosis present

## 2021-06-26 DIAGNOSIS — Z87891 Personal history of nicotine dependence: Secondary | ICD-10-CM

## 2021-06-26 DIAGNOSIS — J9621 Acute and chronic respiratory failure with hypoxia: Secondary | ICD-10-CM | POA: Diagnosis not present

## 2021-06-26 DIAGNOSIS — Z515 Encounter for palliative care: Secondary | ICD-10-CM | POA: Diagnosis not present

## 2021-06-26 DIAGNOSIS — Z8544 Personal history of malignant neoplasm of other female genital organs: Secondary | ICD-10-CM

## 2021-06-26 DIAGNOSIS — Z66 Do not resuscitate: Secondary | ICD-10-CM | POA: Diagnosis present

## 2021-06-26 DIAGNOSIS — Z7902 Long term (current) use of antithrombotics/antiplatelets: Secondary | ICD-10-CM

## 2021-06-26 DIAGNOSIS — D72829 Elevated white blood cell count, unspecified: Secondary | ICD-10-CM | POA: Diagnosis not present

## 2021-06-26 DIAGNOSIS — D751 Secondary polycythemia: Secondary | ICD-10-CM | POA: Diagnosis present

## 2021-06-26 DIAGNOSIS — I255 Ischemic cardiomyopathy: Secondary | ICD-10-CM | POA: Diagnosis present

## 2021-06-26 DIAGNOSIS — F1021 Alcohol dependence, in remission: Secondary | ICD-10-CM | POA: Diagnosis present

## 2021-06-26 DIAGNOSIS — I252 Old myocardial infarction: Secondary | ICD-10-CM

## 2021-06-26 DIAGNOSIS — I5043 Acute on chronic combined systolic (congestive) and diastolic (congestive) heart failure: Secondary | ICD-10-CM | POA: Diagnosis not present

## 2021-06-26 DIAGNOSIS — Z7189 Other specified counseling: Secondary | ICD-10-CM | POA: Diagnosis not present

## 2021-06-26 DIAGNOSIS — Z789 Other specified health status: Secondary | ICD-10-CM | POA: Diagnosis not present

## 2021-06-26 DIAGNOSIS — Z885 Allergy status to narcotic agent status: Secondary | ICD-10-CM

## 2021-06-26 DIAGNOSIS — F419 Anxiety disorder, unspecified: Secondary | ICD-10-CM | POA: Diagnosis present

## 2021-06-26 DIAGNOSIS — R4702 Dysphasia: Secondary | ICD-10-CM | POA: Diagnosis not present

## 2021-06-26 DIAGNOSIS — N1831 Chronic kidney disease, stage 3a: Secondary | ICD-10-CM | POA: Diagnosis not present

## 2021-06-26 DIAGNOSIS — N318 Other neuromuscular dysfunction of bladder: Secondary | ICD-10-CM | POA: Diagnosis present

## 2021-06-26 DIAGNOSIS — I2510 Atherosclerotic heart disease of native coronary artery without angina pectoris: Secondary | ICD-10-CM | POA: Diagnosis present

## 2021-06-26 DIAGNOSIS — J441 Chronic obstructive pulmonary disease with (acute) exacerbation: Secondary | ICD-10-CM | POA: Diagnosis present

## 2021-06-26 DIAGNOSIS — J189 Pneumonia, unspecified organism: Secondary | ICD-10-CM

## 2021-06-26 DIAGNOSIS — J690 Pneumonitis due to inhalation of food and vomit: Secondary | ICD-10-CM | POA: Diagnosis present

## 2021-06-26 DIAGNOSIS — R0902 Hypoxemia: Secondary | ICD-10-CM | POA: Diagnosis not present

## 2021-06-26 DIAGNOSIS — R0689 Other abnormalities of breathing: Secondary | ICD-10-CM | POA: Diagnosis not present

## 2021-06-26 DIAGNOSIS — K219 Gastro-esophageal reflux disease without esophagitis: Secondary | ICD-10-CM | POA: Diagnosis not present

## 2021-06-26 DIAGNOSIS — M810 Age-related osteoporosis without current pathological fracture: Secondary | ICD-10-CM | POA: Diagnosis present

## 2021-06-26 DIAGNOSIS — Z7952 Long term (current) use of systemic steroids: Secondary | ICD-10-CM

## 2021-06-26 DIAGNOSIS — R042 Hemoptysis: Secondary | ICD-10-CM | POA: Diagnosis present

## 2021-06-26 DIAGNOSIS — Z881 Allergy status to other antibiotic agents status: Secondary | ICD-10-CM

## 2021-06-26 DIAGNOSIS — I130 Hypertensive heart and chronic kidney disease with heart failure and stage 1 through stage 4 chronic kidney disease, or unspecified chronic kidney disease: Secondary | ICD-10-CM | POA: Diagnosis present

## 2021-06-26 DIAGNOSIS — E785 Hyperlipidemia, unspecified: Secondary | ICD-10-CM | POA: Diagnosis present

## 2021-06-26 DIAGNOSIS — I5020 Unspecified systolic (congestive) heart failure: Secondary | ICD-10-CM | POA: Diagnosis present

## 2021-06-26 DIAGNOSIS — Z8249 Family history of ischemic heart disease and other diseases of the circulatory system: Secondary | ICD-10-CM

## 2021-06-26 DIAGNOSIS — I739 Peripheral vascular disease, unspecified: Secondary | ICD-10-CM | POA: Diagnosis present

## 2021-06-26 DIAGNOSIS — N183 Chronic kidney disease, stage 3 unspecified: Secondary | ICD-10-CM | POA: Diagnosis present

## 2021-06-26 DIAGNOSIS — Z905 Acquired absence of kidney: Secondary | ICD-10-CM

## 2021-06-26 DIAGNOSIS — I959 Hypotension, unspecified: Secondary | ICD-10-CM | POA: Diagnosis not present

## 2021-06-26 DIAGNOSIS — Z96651 Presence of right artificial knee joint: Secondary | ICD-10-CM | POA: Diagnosis present

## 2021-06-26 DIAGNOSIS — Z955 Presence of coronary angioplasty implant and graft: Secondary | ICD-10-CM

## 2021-06-26 DIAGNOSIS — G8921 Chronic pain due to trauma: Secondary | ICD-10-CM | POA: Diagnosis not present

## 2021-06-26 DIAGNOSIS — Z85528 Personal history of other malignant neoplasm of kidney: Secondary | ICD-10-CM | POA: Diagnosis not present

## 2021-06-26 DIAGNOSIS — Z888 Allergy status to other drugs, medicaments and biological substances status: Secondary | ICD-10-CM

## 2021-06-26 DIAGNOSIS — R9431 Abnormal electrocardiogram [ECG] [EKG]: Secondary | ICD-10-CM | POA: Diagnosis not present

## 2021-06-26 DIAGNOSIS — I5021 Acute systolic (congestive) heart failure: Secondary | ICD-10-CM | POA: Diagnosis not present

## 2021-06-26 DIAGNOSIS — Z9981 Dependence on supplemental oxygen: Secondary | ICD-10-CM

## 2021-06-26 DIAGNOSIS — R0602 Shortness of breath: Secondary | ICD-10-CM | POA: Diagnosis not present

## 2021-06-26 DIAGNOSIS — Z7984 Long term (current) use of oral hypoglycemic drugs: Secondary | ICD-10-CM

## 2021-06-26 DIAGNOSIS — Z7951 Long term (current) use of inhaled steroids: Secondary | ICD-10-CM

## 2021-06-26 DIAGNOSIS — Z961 Presence of intraocular lens: Secondary | ICD-10-CM | POA: Diagnosis present

## 2021-06-26 DIAGNOSIS — Z90710 Acquired absence of both cervix and uterus: Secondary | ICD-10-CM

## 2021-06-26 DIAGNOSIS — I10 Essential (primary) hypertension: Secondary | ICD-10-CM | POA: Diagnosis not present

## 2021-06-26 DIAGNOSIS — G510 Bell's palsy: Secondary | ICD-10-CM | POA: Diagnosis present

## 2021-06-26 DIAGNOSIS — F319 Bipolar disorder, unspecified: Secondary | ICD-10-CM | POA: Diagnosis present

## 2021-06-26 DIAGNOSIS — Z711 Person with feared health complaint in whom no diagnosis is made: Secondary | ICD-10-CM | POA: Diagnosis not present

## 2021-06-26 DIAGNOSIS — Z20822 Contact with and (suspected) exposure to covid-19: Secondary | ICD-10-CM | POA: Diagnosis present

## 2021-06-26 DIAGNOSIS — J9601 Acute respiratory failure with hypoxia: Secondary | ICD-10-CM | POA: Diagnosis not present

## 2021-06-26 DIAGNOSIS — Z79899 Other long term (current) drug therapy: Secondary | ICD-10-CM

## 2021-06-26 DIAGNOSIS — Z7982 Long term (current) use of aspirin: Secondary | ICD-10-CM

## 2021-06-26 DIAGNOSIS — Z96641 Presence of right artificial hip joint: Secondary | ICD-10-CM | POA: Diagnosis present

## 2021-06-26 LAB — CBC WITH DIFFERENTIAL/PLATELET
Abs Immature Granulocytes: 0.09 10*3/uL — ABNORMAL HIGH (ref 0.00–0.07)
Basophils Absolute: 0 10*3/uL (ref 0.0–0.1)
Basophils Relative: 0 %
Eosinophils Absolute: 0.1 10*3/uL (ref 0.0–0.5)
Eosinophils Relative: 1 %
HCT: 45.2 % (ref 36.0–46.0)
Hemoglobin: 15.3 g/dL — ABNORMAL HIGH (ref 12.0–15.0)
Immature Granulocytes: 1 %
Lymphocytes Relative: 8 %
Lymphs Abs: 1.4 10*3/uL (ref 0.7–4.0)
MCH: 32.8 pg (ref 26.0–34.0)
MCHC: 33.8 g/dL (ref 30.0–36.0)
MCV: 97 fL (ref 80.0–100.0)
Monocytes Absolute: 0.5 10*3/uL (ref 0.1–1.0)
Monocytes Relative: 3 %
Neutro Abs: 15.2 10*3/uL — ABNORMAL HIGH (ref 1.7–7.7)
Neutrophils Relative %: 87 %
Platelets: 251 10*3/uL (ref 150–400)
RBC: 4.66 MIL/uL (ref 3.87–5.11)
RDW: 14.3 % (ref 11.5–15.5)
WBC: 17.4 10*3/uL — ABNORMAL HIGH (ref 4.0–10.5)
nRBC: 0 % (ref 0.0–0.2)

## 2021-06-26 LAB — I-STAT CHEM 8, ED
BUN: 35 mg/dL — ABNORMAL HIGH (ref 8–23)
BUN: 37 mg/dL — ABNORMAL HIGH (ref 8–23)
Calcium, Ion: 1.33 mmol/L (ref 1.15–1.40)
Calcium, Ion: 1.33 mmol/L (ref 1.15–1.40)
Chloride: 104 mmol/L (ref 98–111)
Chloride: 104 mmol/L (ref 98–111)
Creatinine, Ser: 1 mg/dL (ref 0.44–1.00)
Creatinine, Ser: 1 mg/dL (ref 0.44–1.00)
Glucose, Bld: 80 mg/dL (ref 70–99)
Glucose, Bld: 83 mg/dL (ref 70–99)
HCT: 47 % — ABNORMAL HIGH (ref 36.0–46.0)
HCT: 47 % — ABNORMAL HIGH (ref 36.0–46.0)
Hemoglobin: 16 g/dL — ABNORMAL HIGH (ref 12.0–15.0)
Hemoglobin: 16 g/dL — ABNORMAL HIGH (ref 12.0–15.0)
Potassium: 4 mmol/L (ref 3.5–5.1)
Potassium: 4.1 mmol/L (ref 3.5–5.1)
Sodium: 137 mmol/L (ref 135–145)
Sodium: 138 mmol/L (ref 135–145)
TCO2: 25 mmol/L (ref 22–32)
TCO2: 27 mmol/L (ref 22–32)

## 2021-06-26 LAB — I-STAT VENOUS BLOOD GAS, ED
Acid-Base Excess: 0 mmol/L (ref 0.0–2.0)
Bicarbonate: 25.3 mmol/L (ref 20.0–28.0)
Calcium, Ion: 1.32 mmol/L (ref 1.15–1.40)
HCT: 47 % — ABNORMAL HIGH (ref 36.0–46.0)
Hemoglobin: 16 g/dL — ABNORMAL HIGH (ref 12.0–15.0)
O2 Saturation: 99 %
Potassium: 4.2 mmol/L (ref 3.5–5.1)
Sodium: 136 mmol/L (ref 135–145)
TCO2: 27 mmol/L (ref 22–32)
pCO2, Ven: 40.8 mmHg — ABNORMAL LOW (ref 44–60)
pH, Ven: 7.4 (ref 7.25–7.43)
pO2, Ven: 152 mmHg — ABNORMAL HIGH (ref 32–45)

## 2021-06-26 LAB — COMPREHENSIVE METABOLIC PANEL
ALT: 27 U/L (ref 0–44)
AST: 23 U/L (ref 15–41)
Albumin: 3.5 g/dL (ref 3.5–5.0)
Alkaline Phosphatase: 99 U/L (ref 38–126)
Anion gap: 10 (ref 5–15)
BUN: 31 mg/dL — ABNORMAL HIGH (ref 8–23)
CO2: 22 mmol/L (ref 22–32)
Calcium: 10.5 mg/dL — ABNORMAL HIGH (ref 8.9–10.3)
Chloride: 104 mmol/L (ref 98–111)
Creatinine, Ser: 1.08 mg/dL — ABNORMAL HIGH (ref 0.44–1.00)
GFR, Estimated: 53 mL/min — ABNORMAL LOW (ref >60)
Glucose, Bld: 84 mg/dL (ref 70–99)
Potassium: 4.1 mmol/L (ref 3.5–5.1)
Sodium: 136 mmol/L (ref 135–145)
Total Bilirubin: 0.6 mg/dL (ref 0.3–1.2)
Total Protein: 6 g/dL — ABNORMAL LOW (ref 6.5–8.1)

## 2021-06-26 LAB — RESP PANEL BY RT-PCR (FLU A&B, COVID) ARPGX2
Influenza A by PCR: NEGATIVE
Influenza B by PCR: NEGATIVE
SARS Coronavirus 2 by RT PCR: NEGATIVE

## 2021-06-26 LAB — BRAIN NATRIURETIC PEPTIDE: B Natriuretic Peptide: 186.1 pg/mL — ABNORMAL HIGH (ref 0.0–100.0)

## 2021-06-26 LAB — TROPONIN I (HIGH SENSITIVITY): Troponin I (High Sensitivity): 56 ng/L — ABNORMAL HIGH (ref <18)

## 2021-06-26 LAB — PROCALCITONIN: Procalcitonin: 2.23 ng/mL

## 2021-06-26 MED ORDER — MONTELUKAST SODIUM 10 MG PO TABS
10.0000 mg | ORAL_TABLET | Freq: Every day | ORAL | Status: DC
  Administered 2021-06-26 – 2021-06-27 (×2): 10 mg via ORAL
  Filled 2021-06-26 (×3): qty 1

## 2021-06-26 MED ORDER — TIZANIDINE HCL 4 MG PO TABS
4.0000 mg | ORAL_TABLET | Freq: Four times a day (QID) | ORAL | Status: DC | PRN
Start: 2021-06-26 — End: 2021-06-28
  Administered 2021-06-26 – 2021-06-28 (×5): 4 mg via ORAL
  Filled 2021-06-26 (×5): qty 1

## 2021-06-26 MED ORDER — SODIUM CHLORIDE 0.9 % IV SOLN
500.0000 mg | INTRAVENOUS | Status: DC
  Filled 2021-06-26: qty 5

## 2021-06-26 MED ORDER — SODIUM CHLORIDE 0.9 % IV BOLUS: 500.0000 mL | Freq: Once | INTRAVENOUS | Status: DC

## 2021-06-26 MED ORDER — SODIUM CHLORIDE 0.9 % IV SOLN
2.0000 g | INTRAVENOUS | Status: DC
  Filled 2021-06-26: qty 20

## 2021-06-26 MED ORDER — ASPIRIN 81 MG PO TBEC
81.0000 mg | DELAYED_RELEASE_TABLET | Freq: Every morning | ORAL | Status: DC
  Administered 2021-06-26 – 2021-06-28 (×3): 81 mg via ORAL
  Filled 2021-06-26 (×3): qty 1

## 2021-06-26 MED ORDER — PANTOPRAZOLE SODIUM 40 MG PO TBEC
40.0000 mg | DELAYED_RELEASE_TABLET | Freq: Every day | ORAL | Status: DC
  Administered 2021-06-26 – 2021-06-28 (×3): 40 mg via ORAL
  Filled 2021-06-26 (×3): qty 1

## 2021-06-26 MED ORDER — FUROSEMIDE 10 MG/ML IJ SOLN: 20.0000 mg | Freq: Once | INTRAMUSCULAR | Status: DC

## 2021-06-26 MED ORDER — SODIUM CHLORIDE 0.9 % IV SOLN
1.0000 g | Freq: Once | INTRAVENOUS | Status: AC
  Administered 2021-06-26: 1 g via INTRAVENOUS
  Filled 2021-06-26: qty 10

## 2021-06-26 MED ORDER — ARFORMOTEROL TARTRATE 15 MCG/2ML IN NEBU
15.0000 ug | INHALATION_SOLUTION | Freq: Two times a day (BID) | RESPIRATORY_TRACT | Status: DC
  Administered 2021-06-26 – 2021-06-28 (×4): 15 ug via RESPIRATORY_TRACT
  Filled 2021-06-26 (×5): qty 2

## 2021-06-26 MED ORDER — IPRATROPIUM BROMIDE 0.02 % IN SOLN
0.5000 mg | Freq: Once | RESPIRATORY_TRACT | Status: AC
  Administered 2021-06-26: 0.5 mg via RESPIRATORY_TRACT
  Filled 2021-06-26: qty 2.5

## 2021-06-26 MED ORDER — ENOXAPARIN SODIUM 40 MG/0.4ML IJ SOSY
40.0000 mg | PREFILLED_SYRINGE | INTRAMUSCULAR | Status: DC
  Administered 2021-06-26 – 2021-06-28 (×3): 40 mg via SUBCUTANEOUS
  Filled 2021-06-26 (×3): qty 0.4

## 2021-06-26 MED ORDER — ARIPIPRAZOLE 10 MG PO TABS
10.0000 mg | ORAL_TABLET | Freq: Every day | ORAL | Status: DC
  Administered 2021-06-26 – 2021-06-28 (×3): 10 mg via ORAL
  Filled 2021-06-26 (×3): qty 1

## 2021-06-26 MED ORDER — LAMOTRIGINE 100 MG PO TABS
200.0000 mg | ORAL_TABLET | Freq: Two times a day (BID) | ORAL | Status: DC
  Administered 2021-06-26 – 2021-06-28 (×4): 200 mg via ORAL
  Filled 2021-06-26 (×6): qty 2

## 2021-06-26 MED ORDER — FENTANYL CITRATE PF 50 MCG/ML IJ SOSY: 25.0000 ug | PREFILLED_SYRINGE | INTRAMUSCULAR | Status: DC | PRN

## 2021-06-26 MED ORDER — METHYLPREDNISOLONE SODIUM SUCC 125 MG IJ SOLR
125.0000 mg | Freq: Once | INTRAMUSCULAR | Status: AC
  Administered 2021-06-26: 125 mg via INTRAVENOUS
  Filled 2021-06-26: qty 2

## 2021-06-26 MED ORDER — PREDNISONE 50 MG PO TABS
60.0000 mg | ORAL_TABLET | Freq: Every day | ORAL | Status: DC
  Administered 2021-06-27 – 2021-06-28 (×2): 60 mg via ORAL
  Filled 2021-06-26 (×2): qty 1

## 2021-06-26 MED ORDER — SODIUM CHLORIDE 0.9% FLUSH
3.0000 mL | Freq: Two times a day (BID) | INTRAVENOUS | Status: DC
  Administered 2021-06-26 – 2021-06-28 (×4): 3 mL via INTRAVENOUS

## 2021-06-26 MED ORDER — SODIUM CHLORIDE 0.9 % IV SOLN
500.0000 mg | Freq: Once | INTRAVENOUS | Status: AC
  Administered 2021-06-26: 500 mg via INTRAVENOUS
  Filled 2021-06-26: qty 5

## 2021-06-26 MED ORDER — OXYCODONE HCL 5 MG PO TABS
10.0000 mg | ORAL_TABLET | ORAL | Status: DC | PRN
  Administered 2021-06-26 – 2021-06-28 (×8): 10 mg via ORAL
  Filled 2021-06-26 (×9): qty 2

## 2021-06-26 MED ORDER — ALBUTEROL SULFATE (2.5 MG/3ML) 0.083% IN NEBU
2.5000 mg | INHALATION_SOLUTION | Freq: Four times a day (QID) | RESPIRATORY_TRACT | Status: DC
  Administered 2021-06-26 – 2021-06-27 (×4): 2.5 mg via RESPIRATORY_TRACT
  Filled 2021-06-26 (×4): qty 3

## 2021-06-26 MED ORDER — ORAL CARE MOUTH RINSE
15.0000 mL | Freq: Two times a day (BID) | OROMUCOSAL | Status: DC
  Administered 2021-06-26 – 2021-06-28 (×3): 15 mL via OROMUCOSAL

## 2021-06-26 MED ORDER — REVEFENACIN 175 MCG/3ML IN SOLN
175.0000 ug | Freq: Every day | RESPIRATORY_TRACT | Status: DC
Start: 2021-06-26 — End: 2021-06-28
  Administered 2021-06-27 – 2021-06-28 (×2): 175 ug via RESPIRATORY_TRACT
  Filled 2021-06-26 (×3): qty 3

## 2021-06-26 MED ORDER — CLOPIDOGREL BISULFATE 75 MG PO TABS
75.0000 mg | ORAL_TABLET | Freq: Every day | ORAL | Status: DC
  Administered 2021-06-26 – 2021-06-28 (×3): 75 mg via ORAL
  Filled 2021-06-26 (×3): qty 1

## 2021-06-26 MED ORDER — ROFLUMILAST 500 MCG PO TABS
500.0000 ug | ORAL_TABLET | Freq: Every day | ORAL | Status: DC
  Administered 2021-06-26 – 2021-06-28 (×3): 500 ug via ORAL
  Filled 2021-06-26 (×3): qty 1

## 2021-06-26 MED ORDER — BUDESONIDE 0.5 MG/2ML IN SUSP
0.5000 mg | Freq: Two times a day (BID) | RESPIRATORY_TRACT | Status: DC
  Administered 2021-06-26 – 2021-06-28 (×4): 0.5 mg via RESPIRATORY_TRACT
  Filled 2021-06-26 (×6): qty 2

## 2021-06-26 MED ORDER — SODIUM CHLORIDE 0.9 % IV BOLUS: 1000.0000 mL | Freq: Once | INTRAVENOUS | Status: DC

## 2021-06-26 MED ORDER — ACETAMINOPHEN 325 MG PO TABS: 650.0000 mg | ORAL_TABLET | Freq: Four times a day (QID) | ORAL | Status: DC | PRN

## 2021-06-26 MED ORDER — ARTIFICIAL TEARS OPHTHALMIC OINT: 1.0000 "application " | TOPICAL_OINTMENT | Freq: Every day | OPHTHALMIC | Status: DC | PRN

## 2021-06-26 MED ORDER — ALBUTEROL SULFATE (2.5 MG/3ML) 0.083% IN NEBU
10.0000 mg/h | INHALATION_SOLUTION | Freq: Once | RESPIRATORY_TRACT | Status: AC
  Administered 2021-06-26: 10 mg/h via RESPIRATORY_TRACT
  Filled 2021-06-26: qty 3

## 2021-06-26 MED ORDER — ALBUTEROL SULFATE (2.5 MG/3ML) 0.083% IN NEBU: 2.5000 mg | INHALATION_SOLUTION | RESPIRATORY_TRACT | Status: DC | PRN

## 2021-06-26 MED ORDER — FUROSEMIDE 10 MG/ML IJ SOLN
40.0000 mg | Freq: Every day | INTRAMUSCULAR | Status: DC
  Administered 2021-06-26: 40 mg via INTRAVENOUS
  Filled 2021-06-26: qty 4

## 2021-06-26 MED ORDER — ATORVASTATIN CALCIUM 10 MG PO TABS
20.0000 mg | ORAL_TABLET | Freq: Every day | ORAL | Status: DC
Start: 2021-06-26 — End: 2021-06-27
  Administered 2021-06-26: 20 mg via ORAL
  Filled 2021-06-26: qty 2

## 2021-06-26 MED ORDER — SERTRALINE HCL 25 MG PO TABS
150.0000 mg | ORAL_TABLET | Freq: Every day | ORAL | Status: DC
  Administered 2021-06-26 – 2021-06-28 (×3): 150 mg via ORAL
  Filled 2021-06-26: qty 2
  Filled 2021-06-26: qty 1
  Filled 2021-06-26 (×2): qty 2

## 2021-06-26 MED ORDER — ACETAMINOPHEN 650 MG RE SUPP: 650.0000 mg | Freq: Four times a day (QID) | RECTAL | Status: DC | PRN

## 2021-06-26 MED ORDER — DOCUSATE SODIUM 100 MG PO CAPS: 100.0000 mg | ORAL_CAPSULE | Freq: Every day | ORAL | Status: DC | PRN

## 2021-06-26 NOTE — H&P (Addendum)
History and Physical    Patient: [name] QQI:[id number] DOB: 1946/01/10 DOA: 06/26/2021 DOS: the patient was seen and examined on 06/26/2021 PCP: Harlan Stains, MD  Patient coming from: Home via EMS  Chief Complaint:  Chief Complaint  Patient presents with   Shortness of Breath   HPI: [name] is a 76 y.o. female with medical history significant of COPD/emphysema on 2 L of oxygen, CAD s/p PCI, systolic CHF last EF 94-17%, and RCC s/p nephrectomy who presented with complaints of shortness of breath progressively worsening over the last week.  Normally patient gets around with use of a walker and has a caregiver who helps her at home.  She reports that she has had a productive cough, but over the last 4 to 5 days was unable to cough anything up.  Denies having any significant fever, chills, chest pain, nausea, vomiting, or diarrhea..  She has been on prednisone 40 mg daily for quite some time.  Plan was to possibly wean her off of this medication.  Patient had been seen yesterday at her pulmonologist office due to her symptoms and it was recommended that she increase her  In route with EMS patient had been placed on CPAP due to complaints of shortness of breath, but blood pressures dropped as low as 68/20.  Patient was taken off CPAP and placed on nonrebreather.  Blood pressure 78/32, O2 saturations 89% on nonrebreather, pulse 83, and respirations 28.  On admission into the emergency department patient was seen to be afebrile with pulse 50-73, blood pressure 76/56-20/78, and O2 saturation currently maintained on nonrebreather 15 L.  Labs significant for WBC 17.4, hemoglobin 15.3, BUN 35, creatinine 1, and BNP 186.1.  Venous blood gas pH 7.4 with PCO2 of 40.8.  Chest x-ray noted interval progression of interstitial edema pattern with new patchy infiltrates in the right upper lobe which may represent edema or infiltrate.  Patient had been given 125 mg of Solu-Medrol, DuoNeb breathing  treatments, Lasix 20 mg IV ordered but not given, Rocephin, and azithromycin.  Review of Systems: As mentioned in the history of present illness. All other systems reviewed and are negative. Past Medical History:  Diagnosis Date   (HFpEF) heart failure with preserved ejection fraction ([place]) 02/24/2017   Echo 05/2020: EF 55, severe inferior HK, mild LVH, Gr1 DD, normal RVSF, RVSP 32.4, mild BAE, mild MR, AV sclerosis without stenosis   Abdominal pain, acute, epigastric 03/02/2012   Acute congestive heart failure ([place]) 02/24/2017   Alcoholism ([place])    recovering since 2000   Anxiety and depression    Arthritis BACK   Bipolar disorder ([place])    Blepharospasm LEFT EYE   CAP (community acquired pneumonia) 03/01/2021   Chronic back pain    Closed fracture of head of left radius 06/29/2017   COPD (chronic obstructive pulmonary disease) ([place])    Coronary artery disease    s/p Inf MI in 2000 tx with PCI to RCA // Myoview in 2019 with inf-lat scar and mild peri-infarct ischemia   Emphysema    GERD (gastroesophageal reflux disease)    History of alcohol abuse RECOVERING SINCE 2000   History of Left renal mass 03/02/2012   underwent partial nephrectomy   HTN (hypertension)    Idiopathic acute facial nerve palsy LEFT SIDE--  BOTOX THERAPY   Inferior MI ([place]) 2000--  POST PTCA W/ STENT X1   Ischemic cardiomyopathy    EF returned to normal   Osteoporosis    Other  and unspecified general anesthetics causing adverse effect in therapeutic use post op delirium--  last anes record w/ chart  from   09-22-2009 (spinal w/ light sedation)   Peripheral vascular disease ([place]) POST RIGHT CAROTID SURG.  1995   Preoperative clearance 06/13/2012   Renal cell carcinoma ([place]) 07/09/2012   Left mass   Rosacea LEFT FACIAL RASH   S/P radiation therapy 02/21/2012   38.75 Gy HDR 5 Fractions- vaginal cuff   Scoliosis    Seizures ([facility])    x 1 after abrupt discontinuation of  Clonidine   Status post carotid  endarterectomy RIGHT --  1995   Status post primary angioplasty with coronary stent 2000--  POST INFERIOR MI   Unstable balance WALKS W/ CANE   Vaginal cancer ([place])    Past Surgical History:  Procedure Laterality Date   BREAST EXCISIONAL BIOPSY Left 2019   b9 axilla Bx X 3   CAROTID ENDARTERECTOMY  1995   RIGHT   CATARACT EXTRACTION W/ INTRAOCULAR LENS  IMPLANT, BILATERAL     CERVICAL CONIZATION W/BX  09-23-2008   CORONARY ANGIOPLASTY WITH STENT PLACEMENT  2000-   INFERIOR MI   X1 STENT TO RCA   EUS N/A 03/05/2012   Procedure: FULL UPPER ENDOSCOPIC ULTRASOUND (EUS) RADIAL and EGD;  Surgeon: Milus Banister, MD;  Location: [facility];  Service: Endoscopy;  Laterality: N/A;  ercp scope first than eus scope   HEMIARTHROPLASTY HIP  12-26-2008   LEFT FEMORAL NECK FX   LEFT HEART CATH AND CORONARY ANGIOGRAPHY N/A 02/08/2021   Procedure: LEFT HEART CATH AND CORONARY ANGIOGRAPHY;  Surgeon: Martinique, Peter M, MD;  Location: [facility];  Service: Cardiovascular;  Laterality: N/A;   ORIF HIP FRACTURE  02-13-2007   RIGHT FEMORAL NECK FX   ORIF RIGHT DISTAL RADIUS AND RIGHT PROXIMAL HUMEROUS NECK FX'S  10-10-2005   POSTERIOR CERVICAL LAMINECTOMY N/A 08/28/2020   Procedure: Laminectomy and Foraminotomy - Cervical Two-Three, Cervical Three-Four, with lateral mass fusion/ fixation;  Surgeon: Dawley, Theodoro Doing, DO;  Location: [facility];  Service: Neurosurgery;  Laterality: N/A;   RIGHT SHOULDER SURG.  2007   ROBOTIC ASSITED PARTIAL NEPHRECTOMY Left 07/09/2012   Procedure: ROBOTIC ASSITED PARTIAL NEPHRECTOMY;  Surgeon: Dutch Gray, MD;  Location: [facility];  Service: Urology;  Laterality: Left;   TOTAL HIP ARTHROPLASTY  04-15-2008   POST FAILED  RIGHT HIP ORIF FEMORAL FX   TOTAL KNEE ARTHROPLASTY  09-22-2009   RIGHT   UPPER RIGHT VAGINAL REGION  12/28/11   BIOPSY: SQUAMOUS CELL CARCINOMA   VAGINAL HYSTERECTOMY  07/06/2009   Secondary to dysplasia   Social History:  reports that she quit smoking about 11  years ago. Her smoking use included cigarettes. She has a 100.00 pack-year smoking history. She has never used smokeless tobacco. She reports that she does not currently use alcohol. She reports that she does not use drugs.  Allergies  Allergen Reactions   Cephalexin Diarrhea    Other reaction(s): diarrhea   Doxycycline Diarrhea   Gabapentin Other (See Comments)    sleepiness   Morphine And Related Anxiety    Was confused when taking it    Family History  Problem Relation Age of Onset   Hypertension Mother    Hypertension Father    Cancer Maternal Grandfather        type unknown   Hypertension Sister     Prior to Admission medications   Medication Sig Start Date End Date Taking? Authorizing Provider  acetaminophen (  TYLENOL) 325 MG tablet Take 2 tablets (650 mg total) by mouth every 4 (four) hours as needed for mild pain ((score 1 to 3) or temp > 100.5). 09/17/20  Yes Angiulli, Lavon Paganini, PA-C  albuterol (PROVENTIL) (2.5 MG/3ML) 0.083% nebulizer solution INAHLE 3 ML'S ( 1 VIAL) VIA NEBULIZER EVERY 6 HOURS AS NEEDED FOR WHEEZING OR FOR SHORTNESS OF BREATH Patient taking differently: Take 2.5 mg by nebulization every 6 (six) hours as needed for shortness of breath or wheezing. 06/23/21  Yes Icard, Bradley L, DO  albuterol (VENTOLIN HFA) 108 (90 Base) MCG/ACT inhaler Inhale 2 puffs into the lungs every 6 (six) hours as needed for wheezing. 09/17/20  Yes Angiulli, Lavon Paganini, PA-C  amLODipine (NORVASC) 5 MG tablet TAKE ONE TABLET BY MOUTH DAILY Patient taking differently: Take 5 mg by mouth daily. 06/16/21  Yes Martinique, Peter M, MD  arformoterol (BROVANA) 15 MCG/2ML NEBU Take 2 mLs (15 mcg total) by nebulization 2 (two) times daily. 06/25/21  Yes Cobb, Karie Schwalbe, NP  ARIPiprazole (ABILIFY) 10 MG tablet Take 1 tablet (10 mg total) by mouth daily. 09/17/20  Yes Angiulli, Lavon Paganini, PA-C  Ascorbic Acid (VITAMIN C) 1000 MG tablet Take 1,000 mg by mouth daily.   Yes [provider]  aspirin EC 81  MG tablet Take 81 mg by mouth every morning.   Yes [provider]  atorvastatin (LIPITOR) 20 MG tablet Take 1 tablet (20 mg total) by mouth daily. 03/19/21 03/14/22 Yes Goodrich, Callie E, PA-C  bisoprolol (ZEBETA) 5 MG tablet TAKE 1/2 TABLET BY MOUTH DAILY Patient taking differently: Take 2.5 mg by mouth daily. 04/28/21  Yes Martinique, Peter M, MD  budesonide (PULMICORT) 0.5 MG/2ML nebulizer solution Take 2 mLs (0.5 mg total) by nebulization in the morning and at bedtime. 06/25/21  Yes Cobb, Karie Schwalbe, NP  CADUET 10-20 MG tablet TAKE 1 TABLET BY MOUTH DAILY. Patient taking differently: Take 1 tablet by mouth daily. 05/06/21  Yes Martinique, Peter M, MD  Calcium Carb-Cholecalciferol 600-400 MG-UNIT TABS Take 1 tablet by mouth daily. 09/17/20  Yes Angiulli, Lavon Paganini, PA-C  celecoxib (CELEBREX) 200 MG capsule Take 400 mg by mouth daily.   Yes [provider]  Cholecalciferol (VITAMIN D3) 50 MCG (2000 UT) TABS Take 2,000 Units by mouth daily. 09/17/20  Yes Angiulli, Lavon Paganini, PA-C  clopidogrel (PLAVIX) 75 MG tablet Take 1 tablet (75 mg total) by mouth daily. 03/08/21  Yes Martinique, Peter M, MD  denosumab (PROLIA) 60 MG/ML SOSY injection Inject 60 mg into the skin every 6 (six) months. 05/23/19  Yes [provider]  dexlansoprazole (DEXILANT) 60 MG capsule Take 1 capsule (60 mg total) by mouth daily. 09/17/20  Yes Angiulli, Lavon Paganini, PA-C  docusate sodium (COLACE) 100 MG capsule Take 100 mg by mouth daily as needed for mild constipation.   Yes [provider]  empagliflozin (JARDIANCE) 10 MG TABS tablet Take 1 tablet (10 mg total) by mouth daily. 02/10/21  Yes Martinique, Peter M, MD  ferrous sulfate 325 (65 FE) MG tablet Take 1 tablet (325 mg total) by mouth daily. 09/17/20  Yes Angiulli, Lavon Paganini, PA-C  furosemide (LASIX) 20 MG tablet Take 1 tablet (20 mg total) by mouth daily. 09/17/20  Yes Angiulli, Lavon Paganini, PA-C  hydrALAZINE (APRESOLINE) 25 MG tablet TAKE ONE TABLET BY MOUTH TWICE A  DAY Patient taking differently: Take 25 mg by mouth in the morning and at bedtime. 05/24/21  Yes Martinique, Peter M, MD  lamoTRIgine (LAMICTAL) 100  MG tablet Take 2 tablets (200 mg total) by mouth 2 (two) times daily. 09/17/20  Yes Angiulli, Lavon Paganini, PA-C  montelukast (SINGULAIR) 10 MG tablet Take 1 tablet (10 mg total) by mouth at bedtime. TAKE 1 TABLET BY MOUTH EVERYDAY AT BEDTIME Patient taking differently: Take 10 mg by mouth at bedtime. 05/13/21  Yes Icard, Octavio Graves, DO  Multiple Vitamin (MULTIVITAMIN) capsule Take 1 capsule by mouth daily.    Yes [provider]  nitroGLYCERIN (NITROSTAT) 0.4 MG SL tablet Place 1 tablet (0.4 mg total) under the tongue every 5 (five) minutes as needed for chest pain. 05/25/21  Yes Martinique, Peter M, MD  Omega-3 Fatty Acids (FISH OIL) 1000 MG CAPS Take 1,000 mg by mouth 2 (two) times daily.   Yes [provider]  Oxycodone HCl 10 MG TABS Take 1 tablet (10 mg total) by mouth every 4 (four) hours as needed for severe pain. 09/17/20  Yes Angiulli, Lavon Paganini, PA-C  OXYGEN Inhale 2 L into the lungs continuous.   Yes [provider]  Polyethyl Glycol-Propyl Glycol (SYSTANE) 0.4-0.3 % GEL ophthalmic gel Place 1 application into both eyes daily as needed (dry eyes).   Yes [provider]  potassium chloride (KLOR-CON) 8 MEQ tablet Take 1 tablet (8 mEq total) by mouth daily. 10/26/20  Yes Martinique, Peter M, MD  predniSONE (DELTASONE) 10 MG tablet Take 10 mg by mouth daily with breakfast. 40 mg x 4 days, 30 mg x 3 day, 20 mg x 2 days, then back to regular daily dose   Yes [provider]  revefenacin (YUPELRI) 175 MCG/3ML nebulizer solution Take 3 mLs (175 mcg total) by nebulization daily. 06/25/21  Yes Cobb, Karie Schwalbe, NP  roflumilast (DALIRESP) 500 MCG TABS tablet Take 500 mcg by mouth daily.   Yes [provider]  sacubitril-valsartan (ENTRESTO) 49-51 MG Take 1 tablet by mouth 2 (two) times daily. 03/15/21  Yes Martinique, Peter M, MD   sertraline (ZOLOFT) 100 MG tablet Take 150 mg by mouth daily. Take 150 mg per patient.   Yes [provider]  tiZANidine (ZANAFLEX) 4 MG tablet Take 1 tablet (4 mg total) by mouth every 6 (six) hours. 09/17/20  Yes Angiulli, Lavon Paganini, PA-C  predniSONE (DELTASONE) 10 MG tablet Take 1 tablet (10 mg total) by mouth daily with breakfast. 06/07/21   Icard, Octavio Graves, DO  roflumilast (DALIRESP) 500 MCG TABS tablet Take 0.5 tablets (250 mcg total) by mouth daily for 28 days, THEN 1 tablet (500 mcg total) daily. Patient not taking: Reported on 06/26/2021 04/07/21 11/01/21  Clayton Bibles, NP    Physical Exam: Vitals:   06/26/21 0630 06/26/21 0635 06/26/21 0645 06/26/21 0801  BP: (!) 94/57  (!) 94/58   Pulse: 61 (!) 59 62 67  Resp: '18 16 15 18  '$ Temp:      TempSrc:      SpO2: 96% 93% 96% 98%   Constitutional: Elderly female who appears to be in some respiratory distress Eyes: PERRL, lids and conjunctivae normal ENMT: Mucous membranes are moist. Posterior pharynx clear of any exudate or lesions.Normal dentition.  Neck: normal, supple, JVD present possibly to the angle of the jaw, but hard to clearly see Respiratory: Decreased breath sounds with some crackles, squeaks, and rales appreciated mostly in the lower lung fields. Patient currently on BiPAP and able to talk in shortened sentences.  Patient coughed up blood-tinged sputum while in the emergency department. Cardiovascular: Regular rate and rhythm.  No extremity  edema. 2+ pedal pulses. No carotid bruits.  Abdomen:  protuberant abdomen that is nontender to palpation bowel sounds present all 4 quadrants. Musculoskeletal: no clubbing / cyanosis.  Thoracic kyphosis present. Skin: Bruising noted of the bilateral upper extremities Neurologic: CN 2-12 grossly intact.  Generalized weakness without clear focal deficit Psychiatric: Normal judgment and insight. Alert and oriented x 3. Normal mood.   Data Reviewed:  EKG revealed sinus rhythm at  68 bpm with old inferior infarct  Assessment and Plan: Acute on chronic respiratory failure with hypoxia secondary to heart failure with reduced EF Patient presented with complaints of shortness of breath.  O2 saturation to be as low as 89% on a nonrebreather mask.  Patient placed on BiPAP with respiratory distress.  On physical exam patient with crackles, squeaks, and rales, but no significant lower extremity edema.  Chest x-ray noted interval progression interstitial opacities concerning for edema. At baseline patient is on 2 L of oxygen 24/7.  BNP 186.1.  Last echocardiogram revealed EF of 40-45% with septal apical and inferior basilar hypokinesis, and normal diastolic parameters in 03/3543.  Furosemide 20 mg IV have been ordered in the ED, but was not given.   -Admit to a progressive bed -Continuous pulse oximetry with nasal cannula oxygen maintain O2 saturations -Continue BiPAP and wean off once able -Strict I&Os and daily weights -Held blood pressure regimen due to initial hypotension. -Lasix IV 40 mg IV daily per cardiology recommendation -Cardiology consulted ,  will follow-up for any further recommendations -Palliative care consulted  Severe COPD with exacerbation Patient reportedly uses at home had been given Solu-Medrol 125 mg IV as well as DuoNeb breathing treatments while in the ED. Seen at her pulmonologist  office yesterday and had adjustments to her breathing treatment regimen were made.  Patient has been on prednisone 40 mg for some time. -Continue home nebulizer treatments of Brovana, budesonide, and revefenacin. -Albuterol nebs 4 times daily and as needed -Prednisone 60 mg daily -Empiric antibiotics of Rocephin and azithromycin due to COPD exacerbation -Continue outpatient follow-up with pulmonology.  Consider formally consulting me if needing additional assistance due to patient's breathing  Possible community-acquired pneumonia Patient reports that she has had a cough that  had been nonproductive for the last 4 days, but coughed up blood-tinged sputum while in the ED.  Chest x-ray noted progressively worsening edema with new right upper lobe opacity concerning for edema or infiltrate.  Patient denied any complaints of fever or chills. -Check procalcitonin -Check sputum culture -Continue empiric antibiotics of Rocephin and azithromycin  Leukocytosis Acute.  WBC elevated at 17.4.  Question possibly underlying infection based off chest x-ray. Patient is on chronic steroids and could be a possible factor.  -Evaluation and treatment as noted above -Recheck CBC tomorrow morning  Hypotension Acute.  On admission blood pressures noted to be as low as 76/57.  Patient had been ordered bolus of 500 mL of IV fluids, but was discontinued after blood pressure is improved following being placed on BiPAP.  -Held home blood pressure medications.  Consider resuming bisoprolol when medically appropriate if blood pressures allow -Continue to monitor and consider bolus of IV fluids if needed for systolic blood pressures less than 65  CAD Patient was noted to have three-vessel obstructive coronary artery disease during last cath from 02/07/2021.  Patient was not a CABG candidate and PCI was not able to be pursued given severely calcification and tortuosity of vessels.  Medical management had been recommended at that time. -Continue aspirin,  Plavix, statin  History of renal cell carcinoma  CKD stage IIIa Patient status post partial nephrectomy of the left kidney in 2014.  Creatinine 1.08 which appears slightly above baseline previously noted to be 0.9. -Continue to monitor kidney function  Chronic  pain -Continue oxycodone as needed  Polycythemia Acute on chronic.  Hemoglobin 15.3.  Thought possibly be secondary to patient's history of tobacco abuse and COPD. -Continue to monitor  Depression, bipolar disorder -Continue Abilify, Lamictal, and Zoloft  Hyperlipidemia -Continue  atorvastatin  GERD -Continue pharmacy substitution of Protonix for Dexilant  DVT prophylaxis: Lovenox Advance Care Planning:   Code Status: DNR    Consults: Cardiology  Family Communication: Patient's caregiver updated at bedside Severity of Illness: The appropriate patient status for this patient is INPATIENT. Inpatient status is judged to be reasonable and necessary in order to provide the required intensity of service to ensure the patient's safety. The patient's presenting symptoms, physical exam findings, and initial radiographic and laboratory data in the context of their chronic comorbidities is felt to place them at high risk for further clinical deterioration. Furthermore, it is not anticipated that the patient will be medically stable for discharge from the hospital within 2 midnights of admission.   * I certify that at the point of admission it is my clinical judgment that the patient will require inpatient hospital care spanning beyond 2 midnights from the point of admission due to high intensity of service, high risk for further deterioration and high frequency of surveillance required.*  Author: Norval Morton, MD 06/26/2021 9:35 AM  For on call review [email or web address].

## 2021-06-26 NOTE — Plan of Care (Signed)
  Problem: Education: Goal: Knowledge of General Education information will improve Description: Including pain rating scale, medication(s)/side effects and non-pharmacologic comfort measures Outcome: Progressing   Problem: Clinical Measurements: Goal: Cardiovascular complication will be avoided Outcome: Progressing   Problem: Coping: Goal: Level of anxiety will decrease Outcome: Progressing   Problem: Elimination: Goal: Will not experience complications related to bowel motility Outcome: Progressing Goal: Will not experience complications related to urinary retention Outcome: Progressing   Problem: Pain Managment: Goal: General experience of comfort will improve Outcome: Progressing   Problem: Safety: Goal: Ability to remain free from injury will improve Outcome: Progressing

## 2021-06-26 NOTE — ED Notes (Signed)
RT at bedside to remove bipap.

## 2021-06-26 NOTE — ED Triage Notes (Signed)
Pt brought in by [facility], called out for [facility]. Placed on CPAP, "tanked pressures" to 68/20. Pt is on palliative care for CHF and COPD. EMS Vitals  78/32 O2 89% NRB Pulse 83 28 RR

## 2021-06-26 NOTE — ED Provider Notes (Signed)
[facility] Provider Note  CSN: [id number] Arrival date & time: 06/26/21 0431  Chief Complaint(s) Shortness of Breath  HPI [name] is a 76 y.o. female with a past medical history listed below including hypertension, COPD, CHF with a last EF of 59 to 45% in January of this year who presents to the emergency department with shortness of breath that began earlier this morning after she awoke to go to the bathroom.  She attempted to treat the shortness of breath with home breathing treatments which did not provide much relief prompting a call to EMS.  When they arrived patient was tachypneic.  Initial pressures were normal.  She was placed on CPAP resulting in drop in blood pressures.  She was subsequently taken off and placed on nonrebreather.  Pressures came up to systolics in the 29J.  On nonrebreather the patient maintain sats in the mid to high 80s.  She denies any associated chest pain.  No coughing or congestion.  No fevers.  Since being placed on nonrebreather she reports feeling much better.   On record review: Patient was seen in the pulmonology office yesterday.  They noted that patient was recently seen on 6/2 for COPD exacerbation and was given steroids and 5 days of Doxy.  Her follow-up visit at 6/9 reported that she was feeling much better.  The history is provided by the patient.    Past Medical History Past Medical History:  Diagnosis Date   (HFpEF) heart failure with preserved ejection fraction ([place]) 02/24/2017   Echo 05/2020: EF 55, severe inferior HK, mild LVH, Gr1 DD, normal RVSF, RVSP 32.4, mild BAE, mild MR, AV sclerosis without stenosis   Abdominal pain, acute, epigastric 03/02/2012   Acute congestive heart failure ([place]) 02/24/2017   Alcoholism ([place])    recovering since 2000   Anxiety and depression    Arthritis BACK   Bipolar disorder ([place])    Blepharospasm LEFT EYE   CAP (community acquired pneumonia) 03/01/2021    Chronic back pain    Closed fracture of head of left radius 06/29/2017   COPD (chronic obstructive pulmonary disease) ([place])    Coronary artery disease    s/p Inf MI in 2000 tx with PCI to RCA // Myoview in 2019 with inf-lat scar and mild peri-infarct ischemia   Emphysema    GERD (gastroesophageal reflux disease)    History of alcohol abuse RECOVERING SINCE 2000   History of Left renal mass 03/02/2012   underwent partial nephrectomy   HTN (hypertension)    Idiopathic acute facial nerve palsy LEFT SIDE--  BOTOX THERAPY   Inferior MI ([place]) 2000--  POST PTCA W/ STENT X1   Ischemic cardiomyopathy    EF returned to normal   Osteoporosis    Other and unspecified general anesthetics causing adverse effect in therapeutic use post op delirium--  last anes record w/ chart  from   09-22-2009 (spinal w/ light sedation)   Peripheral vascular disease ([place]) POST RIGHT CAROTID SURG.  1995   Preoperative clearance 06/13/2012   Renal cell carcinoma ([place]) 07/09/2012   Left mass   Rosacea LEFT FACIAL RASH   S/P radiation therapy 02/21/2012   38.75 Gy HDR 5 Fractions- vaginal cuff   Scoliosis    Seizures (HCC)    x 1 after abrupt discontinuation of  Clonidine   Status post carotid endarterectomy RIGHT --  1995   Status post primary angioplasty with coronary stent 2000--  POST INFERIOR MI  Unstable balance WALKS W/ CANE   Vaginal cancer [facility])    Patient Active Problem List   Diagnosis Date Noted   Disorder of arteries and arterioles, unspecified ([place]) 05/25/2021   Essential tremor 05/25/2021   Hardening of the aorta (main artery of the heart) ([place]) 05/25/2021   Personal history of other malignant neoplasm of kidney 05/25/2021   Iron deficiency anemia 05/25/2021   Prediabetes 05/25/2021   Simple chronic bronchitis ([place]) 05/25/2021   Tobacco dependence in remission 05/25/2021   Palliative care encounter 05/22/2021   Esophageal dysmotility 04/01/2021   Shortness of breath 03/11/2021   Chronic  respiratory failure with hypoxia ([place]) 02/19/2021   Lung nodule 02/19/2021   Elevated troponin    Cervical dystonia 11/30/2020   Myofascial pain 11/30/2020   Abnormality of gait 11/30/2020   Chronic pain due to trauma 10/14/2020   Pressure injury of skin 09/12/2020   Neurogenic bladder 09/09/2020   Central cord syndrome ([place]) 09/02/2020   Surgery, elective    Chronic diastolic congestive heart failure (HCC)    Postoperative pain    Cervical myelopathy ([place]) 08/28/2020   Spinal stenosis in cervical region 08/28/2020   Acute respiratory failure with hypoxia ([place]) 05/28/2020   Chronic combined systolic and diastolic CHF (congestive heart failure) ([place]) 05/28/2020   GERD (gastroesophageal reflux disease) 07/02/2019   Pain in left knee 03/21/2019   Chronic diastolic CHF (congestive heart failure) ([place]) 03/07/2019   Atherosclerosis of native artery of both lower extremities with intermittent claudication ([place]) 03/30/2017   Chest pain 02/24/2017   Unspecified gastritis and gastroduodenitis without mention of hemorrhage 03/05/2012   Hypoxia 03/04/2012   Former smoker 03/04/2012   COPD with acute exacerbation ([place]) 03/04/2012   Abnormal abdominal CT scan 03/02/2012   Hypertension    Anxiety and depression    Alcoholism ([place])    Bipolar disorder ([place])    Status post carotid endarterectomy    Arthritis    Idiopathic acute facial nerve palsy    Blepharospasm    Anxiety    Vaginal cancer ([place])    Dry eye syndrome 11/28/2011   Hemifacial spasm 11/28/2011   Myogenic ptosis 11/28/2011   Carotid artery disease ([place]) 06/28/2011   Osteoporosis 05/30/2011   Hyperlipidemia 03/23/2011   Coronary artery disease    NSTEMI (non-ST elevated myocardial infarction) ([place])    Peripheral vascular disease ([place])    HTN (hypertension)    Dysplasia of vagina, histologically confirmed 12/14/2010   Trigeminal neuralgia 12/07/2010   Rosacea 10/08/2010   CAD S/P percutaneous coronary angioplasty  04/12/2010   Osteoarthritis, multiple sites 02/11/2010   Home Medication(s) Prior to Admission medications   Medication Sig Start Date End Date Taking? Authorizing Provider  acetaminophen (TYLENOL) 325 MG tablet Take 2 tablets (650 mg total) by mouth every 4 (four) hours as needed for mild pain ((score 1 to 3) or temp > 100.5). 09/17/20  Yes Angiulli, Lavon Paganini, PA-C  albuterol (PROVENTIL) (2.5 MG/3ML) 0.083% nebulizer solution INAHLE 3 ML'S ( 1 VIAL) VIA NEBULIZER EVERY 6 HOURS AS NEEDED FOR WHEEZING OR FOR SHORTNESS OF BREATH Patient taking differently: Take 2.5 mg by nebulization every 6 (six) hours as needed for shortness of breath or wheezing. 06/23/21  Yes Icard, Bradley L, DO  albuterol (VENTOLIN HFA) 108 (90 Base) MCG/ACT inhaler Inhale 2 puffs into the lungs every 6 (six) hours as needed for wheezing. 09/17/20  Yes Angiulli, Lavon Paganini, PA-C  amLODipine (NORVASC) 5 MG tablet TAKE ONE TABLET BY MOUTH DAILY Patient taking differently:  Take 5 mg by mouth daily. 06/16/21  Yes [place], Peter M, MD  arformoterol (BROVANA) 15 MCG/2ML NEBU Take 2 mLs (15 mcg total) by nebulization 2 (two) times daily. 06/25/21  Yes Cobb, Karie Schwalbe, NP  ARIPiprazole (ABILIFY) 10 MG tablet Take 1 tablet (10 mg total) by mouth daily. 09/17/20  Yes Angiulli, Lavon Paganini, PA-C  Ascorbic Acid (VITAMIN C) 1000 MG tablet Take 1,000 mg by mouth daily.   Yes [provider]  aspirin EC 81 MG tablet Take 81 mg by mouth every morning.   Yes [provider]  atorvastatin (LIPITOR) 20 MG tablet Take 1 tablet (20 mg total) by mouth daily. 03/19/21 03/14/22 Yes Goodrich, Callie E, PA-C  bisoprolol (ZEBETA) 5 MG tablet TAKE 1/2 TABLET BY MOUTH DAILY Patient taking differently: Take 2.5 mg by mouth daily. 04/28/21  Yes [place], Peter M, MD  budesonide (PULMICORT) 0.5 MG/2ML nebulizer solution Take 2 mLs (0.5 mg total) by nebulization in the morning and at bedtime. 06/25/21  Yes Cobb, Karie Schwalbe, NP  CADUET 10-20 MG tablet TAKE 1  TABLET BY MOUTH DAILY. Patient taking differently: Take 1 tablet by mouth daily. 05/06/21  Yes [place], Peter M, MD  Calcium Carb-Cholecalciferol 600-400 MG-UNIT TABS Take 1 tablet by mouth daily. 09/17/20  Yes Angiulli, Lavon Paganini, PA-C  celecoxib (CELEBREX) 200 MG capsule Take 400 mg by mouth daily.   Yes [provider]  Cholecalciferol (VITAMIN D3) 50 MCG (2000 UT) TABS Take 2,000 Units by mouth daily. 09/17/20  Yes Angiulli, Lavon Paganini, PA-C  clopidogrel (PLAVIX) 75 MG tablet Take 1 tablet (75 mg total) by mouth daily. 03/08/21  Yes [place], Peter M, MD  denosumab (PROLIA) 60 MG/ML SOSY injection Inject 60 mg into the skin every 6 (six) months. 05/23/19  Yes [provider]  dexlansoprazole (DEXILANT) 60 MG capsule Take 1 capsule (60 mg total) by mouth daily. 09/17/20  Yes Angiulli, Lavon Paganini, PA-C  docusate sodium (COLACE) 100 MG capsule Take 100 mg by mouth daily as needed for mild constipation.   Yes [provider]  empagliflozin (JARDIANCE) 10 MG TABS tablet Take 1 tablet (10 mg total) by mouth daily. 02/10/21  Yes [place], Peter M, MD  ferrous sulfate 325 (65 FE) MG tablet Take 1 tablet (325 mg total) by mouth daily. 09/17/20  Yes Angiulli, Lavon Paganini, PA-C  furosemide (LASIX) 20 MG tablet Take 1 tablet (20 mg total) by mouth daily. 09/17/20  Yes Angiulli, Lavon Paganini, PA-C  hydrALAZINE (APRESOLINE) 25 MG tablet TAKE ONE TABLET BY MOUTH TWICE A DAY Patient taking differently: Take 25 mg by mouth in the morning and at bedtime. 05/24/21  Yes [place], Peter M, MD  lamoTRIgine (LAMICTAL) 100 MG tablet Take 2 tablets (200 mg total) by mouth 2 (two) times daily. 09/17/20  Yes Angiulli, Lavon Paganini, PA-C  montelukast (SINGULAIR) 10 MG tablet Take 1 tablet (10 mg total) by mouth at bedtime. TAKE 1 TABLET BY MOUTH EVERYDAY AT BEDTIME Patient taking differently: Take 10 mg by mouth at bedtime. 05/13/21  Yes Icard, Octavio Graves, DO  Multiple Vitamin (MULTIVITAMIN) capsule Take 1 capsule by mouth daily.    Yes  [provider]  nitroGLYCERIN (NITROSTAT) 0.4 MG SL tablet Place 1 tablet (0.4 mg total) under the tongue every 5 (five) minutes as needed for chest pain. 05/25/21  Yes [place], Peter M, MD  Omega-3 Fatty Acids (FISH OIL) 1000 MG CAPS Take 1,000 mg by mouth 2 (two) times daily.   Yes [provider]  Oxycodone HCl 10 MG TABS Take 1 tablet (10 mg total) by mouth every 4 (four) hours as needed for severe pain. 09/17/20  Yes Angiulli, Lavon Paganini, PA-C  OXYGEN Inhale 2 L into the lungs continuous.   Yes [provider]  Polyethyl Glycol-Propyl Glycol (SYSTANE) 0.4-0.3 % GEL ophthalmic gel Place 1 application into both eyes daily as needed (dry eyes).   Yes [provider]  potassium chloride (KLOR-CON) 8 MEQ tablet Take 1 tablet (8 mEq total) by mouth daily. 10/26/20  Yes [place], Peter M, MD  predniSONE (DELTASONE) 10 MG tablet Take 10 mg by mouth daily with breakfast. 40 mg x 4 days, 30 mg x 3 day, 20 mg x 2 days, then back to regular daily dose   Yes [provider]  revefenacin (YUPELRI) 175 MCG/3ML nebulizer solution Take 3 mLs (175 mcg total) by nebulization daily. 06/25/21  Yes Cobb, Karie Schwalbe, NP  roflumilast (DALIRESP) 500 MCG TABS tablet Take 500 mcg by mouth daily.   Yes [provider]  sacubitril-valsartan (ENTRESTO) 49-51 MG Take 1 tablet by mouth 2 (two) times daily. 03/15/21  Yes [place], Peter M, MD  sertraline (ZOLOFT) 100 MG tablet Take 150 mg by mouth daily. Take 150 mg per patient.   Yes [provider]  tiZANidine (ZANAFLEX) 4 MG tablet Take 1 tablet (4 mg total) by mouth every 6 (six) hours. 09/17/20  Yes Angiulli, Lavon Paganini, PA-C  predniSONE (DELTASONE) 10 MG tablet Take 1 tablet (10 mg total) by mouth daily with breakfast. 06/07/21   Icard, Octavio Graves, DO  roflumilast (DALIRESP) 500 MCG TABS tablet Take 0.5 tablets (250 mcg total) by mouth daily for 28 days, THEN 1 tablet (500 mcg total) daily. Patient not taking: Reported on  06/26/2021 04/07/21 11/01/21  Clayton Bibles, NP                                                                                                                                    Allergies Cephalexin, Doxycycline, Gabapentin, and Morphine and related  Review of Systems Review of Systems As noted in HPI  Physical Exam Vital Signs  I have reviewed the triage vital signs BP (!) 94/58   Pulse 62   Temp 98.3 F (36.8 C) (Oral)   Resp 15   SpO2 96%   Physical Exam Vitals reviewed.  Constitutional:      General: She is not in acute distress.    Appearance: She is well-developed. She is not diaphoretic.  HENT:     Head: Normocephalic and atraumatic.     Nose: Nose normal.  Eyes:     General: No scleral icterus.       Right eye: No discharge.        Left eye: No discharge.     Conjunctiva/sclera: Conjunctivae normal.     Pupils: Pupils are equal, round, and reactive to light.  Cardiovascular:  Rate and Rhythm: Normal rate and regular rhythm.     Heart sounds: No murmur heard.    No friction rub. No gallop.  Pulmonary:     Effort: Tachypnea and respiratory distress present.     Breath sounds: Decreased air movement present. No stridor. Examination of the right-lower field reveals rales. Examination of the left-lower field reveals rales. Rales present.  Abdominal:     General: There is no distension.     Palpations: Abdomen is soft.     Tenderness: There is no abdominal tenderness.  Musculoskeletal:        General: No tenderness.     Cervical back: Normal range of motion and neck supple.     Comments: Kyphotic  Skin:    General: Skin is warm and dry.     Findings: No erythema or rash.  Neurological:     Mental Status: She is alert and oriented to person, place, and time.     ED Results and Treatments Labs (all labs ordered are listed, but only abnormal results are displayed) Labs Reviewed  COMPREHENSIVE METABOLIC PANEL - Abnormal; Notable for the following  components:      Result Value   BUN 31 (*)    Creatinine, Ser 1.08 (*)    Calcium 10.5 (*)    Total Protein 6.0 (*)    GFR, Estimated 53 (*)    All other components within normal limits  CBC WITH DIFFERENTIAL/PLATELET - Abnormal; Notable for the following components:   WBC 17.4 (*)    Hemoglobin 15.3 (*)    Neutro Abs 15.2 (*)    Abs Immature Granulocytes 0.09 (*)    All other components within normal limits  BRAIN NATRIURETIC PEPTIDE - Abnormal; Notable for the following components:   B Natriuretic Peptide 186.1 (*)    All other components within normal limits  I-STAT CHEM 8, ED - Abnormal; Notable for the following components:   BUN 35 (*)    Hemoglobin 16.0 (*)    HCT 47.0 (*)    All other components within normal limits  I-STAT CHEM 8, ED - Abnormal; Notable for the following components:   BUN 37 (*)    Hemoglobin 16.0 (*)    HCT 47.0 (*)    All other components within normal limits  I-STAT VENOUS BLOOD GAS, ED - Abnormal; Notable for the following components:   pCO2, Ven 40.8 (*)    pO2, Ven 152 (*)    HCT 47.0 (*)    Hemoglobin 16.0 (*)    All other components within normal limits  RESP PANEL BY RT-PCR (FLU A&B, COVID) ARPGX2  BLOOD GAS, ARTERIAL                                                                                                                         EKG  EKG Interpretation  Date/Time:  Saturday June 26 2021 05:22:26 EDT Ventricular Rate:  68 PR Interval:  165 QRS Duration: 85 QT Interval:  367 QTC Calculation: 391 R Axis:   -32 Text Interpretation: Sinus rhythm Inferior infarct, old Consider anterior infarct Confirmed by Addison Lank [phone]) on 06/26/2021 5:45:05 AM       Radiology DG Chest Port 1 View  Result Date: 06/26/2021 CLINICAL DATA:  Shortness of breath. EXAM: PORTABLE CHEST 1 VIEW COMPARISON:  06/25/2021 FINDINGS: Stable cardiomediastinal contours. Aortic atherosclerosis. Interval progression of interstitial edema pattern with a  lower lung zone predominance. No pleural effusion identified. There are a few patchy opacities within the right upper lobe which appear new from previous exam. The visualized osseous structures are intact. Previous ORIF of the proximal right humerus. IMPRESSION: 1. Interval progression of interstitial edema pattern. 2. New patchy opacities within the right upper lobe, this may represent an area of pneumonia or asymmetric edema. Electronically Signed   By: Kerby Moors M.D.   On: 06/26/2021 05:55    Pertinent labs & imaging results that were available during my care of the patient were reviewed by me and considered in my medical decision making (see MDM for details).  Medications Ordered in ED Medications  furosemide (LASIX) injection 20 mg (has no administration in time range)  azithromycin (ZITHROMAX) 500 mg in sodium chloride 0.9 % 250 mL IVPB (has no administration in time range)  sodium chloride 0.9 % bolus 500 mL (has no administration in time range)  methylPREDNISolone sodium succinate (SOLU-MEDROL) 125 mg/2 mL injection 125 mg (125 mg Intravenous Given 06/26/21 0455)  albuterol (PROVENTIL) (2.5 MG/3ML) 0.083% nebulizer solution (10 mg/hr Nebulization Given 06/26/21 0455)  ipratropium (ATROVENT) nebulizer solution 0.5 mg (0.5 mg Nebulization Given 06/26/21 0455)  cefTRIAXone (ROCEPHIN) 1 g in sodium chloride 0.9 % 100 mL IVPB (0 g Intravenous Stopped 06/26/21 0730)                                                                                                                                     Procedures .Critical Care  Performed by: Fatima Blank, MD Authorized by: Fatima Blank, MD   Critical care provider statement:    Critical care time (minutes):  75   Critical care time was exclusive of:  Separately billable procedures and treating other patients   Critical care was necessary to treat or prevent imminent or life-threatening deterioration of the following  conditions:  Respiratory failure, circulatory failure and cardiac failure   Critical care was time spent personally by me on the following activities:  Development of treatment plan with patient or surrogate, discussions with consultants, evaluation of patient's response to treatment, examination of patient, obtaining history from patient or surrogate, review of old charts, re-evaluation of patient's condition, pulse oximetry, ordering and review of radiographic studies, ordering and review of laboratory studies and ordering and performing treatments and interventions   Care discussed with: admitting provider     (including critical care time)  Medical Decision Making / ED Course    Complexity  of Problem:  Co-morbidities/SDOH that complicate the patient evaluation/care: Noted above in HPI  Patient's presenting problem/concern, DDX, and MDM listed below: Shortness of breath Considering COPD exacerbation with superimposed CHF exacerbation. We will assess for pneumothorax, pneumonia. Low blood pressures Upon arrival patient's blood pressures improved to the low 100s without intervention  Hospitalization Considered:  yes  Initial Intervention:  Provided with breathing treatment Solu-Medrol Close monitoring    Complexity of Data:   Cardiac Monitoring: The patient was maintained on a cardiac monitor.   I personally viewed and interpreted the cardiac monitored which showed an underlying rhythm of normal sinus rhythm EKG without acute ischemic changes, dysrhythmias or blocks.  Laboratory Tests ordered listed below with my independent interpretation: CBC with mild leukocytosis No anemia No significant electrolyte derangements Mild renal insufficiency VBG without evidence of respiratory acidosis   Imaging Studies ordered listed below with my independent interpretation: Chest x-ray with evidence of heart failure versus pneumonia. No pneumothorax     ED Course:    Assessment,  Add'l Intervention, and Reassessment: Shortness of breath Most consistent with COPD/CHF exacerbation  possible pneumonia on chest x-ray. Patient was initially doing well on a nonrebreather and after breathing treatment. However during encounter she began to have drop her blood pressures with systolics into the 78M.  Patient was still mentating fine. Patient was started on empiric antibiotics for pneumonia, gentle IV fluids to assist with blood pressure control. She was placed on BiPAP to prevent additional pulmonary edema. BP did improve after being placed on BiPAP before IVF were started. Case was discussed with Dr. Eustaquio Boyden from hospitalist service who agreed to admit patient for further work-up and management.  He requested that we touch base with cardiology to assist in the care of the heart failure and determine whether patient needs to be placed on midodrine. Cardiology will see patient and assist with care    Final Clinical Impression(s) / ED Diagnoses Final diagnoses:  COPD exacerbation ([place])  Acute on chronic combined systolic and diastolic congestive heart failure ([place])           This chart was dictated using voice recognition software.  Despite best efforts to proofread,  errors can occur which can change the documentation meaning.    Fatima Blank, MD 06/26/21 [phone]

## 2021-06-26 NOTE — Progress Notes (Signed)
Pt taken off bipap and placed on 4l [place]. Pt is tolerating well, RT will monitor.

## 2021-06-26 NOTE — Progress Notes (Signed)
Pt in no distress at this time requiring bipap.  RT will cont to monitor. 

## 2021-06-26 NOTE — Consult Note (Addendum)
Cardiology Consultation:   Patient ID: [name] MRN: [id number]; DOB: Mar 30, 1945  Admit date: 06/26/2021 Date of Consult: 06/26/2021  PCP:  Harlan Stains, MD   [facility] Providers Cardiologist:  Peter Martinique, MD        Patient Profile:   [name] is a 76 y.o. female with a hx of IMI 2000 s/p stent RCA, MV 2019 w/ scar, no ischemia, acute CHF w/ MI 01/2021 w/ med rx for 3 v dz, HTN, GERD, HFpEF w/ EF 40-45% 01/2021 echo, GERD, R CEA, renal cell CA s/p partial L nephrectomy, vaginal CA, DNR, who is being seen 06/26/2021 for the evaluation of CHF at the request of Dr Tamala Julian.  History of Present Illness:   Ms. Newburn has been doing well since her MI in January.  She did not have any chest pain with this MI, but had sudden onset of shortness of breath.    She weighs herself every day.  She does not eat a high salt diet.  Her weight has been stable, not varying more than 2 pounds.  Her respiratory status has been at baseline.  She was seen 06/09 by pulmonary  She woke up at 3:30 this morning with extreme shortness of breath.  She had no chest pain.  She was panting sitting still.  The symptoms reminded her of her MI in January 2023, but were not as severe.  She came to the emergency room, where she was placed on CPAP.  However, her systolic blood pressure dropped to 68 per EMS.    Because of the hypotension, no Lasix was given.  She was given IV fluids, the amount is unclear.    She was placed on BiPAP, but the settings were adjusted and her blood pressure gradually improved.  Her last blood pressure was 235 systolic.  As her blood pressure improved, she gradually came to feel that she was back to normal.  Her breathing is improved on the BiPAP.  She wonders if she can go home, but has not tried to breathe off the BiPAP.   She has not had any symptoms like this since her admission in January.   Past Medical History:  Diagnosis Date   (HFpEF) heart failure with  preserved ejection fraction ([place]) 02/24/2017   Echo 05/2020: EF 55, severe inferior HK, mild LVH, Gr1 DD, normal RVSF, RVSP 32.4, mild BAE, mild MR, AV sclerosis without stenosis   Abdominal pain, acute, epigastric 03/02/2012   Acute congestive heart failure ([place]) 02/24/2017   Anxiety and depression    Arthritis    BACK   Bipolar disorder ([place])    Blepharospasm    LEFT EYE   CAP (community acquired pneumonia) 03/01/2021   Chronic back pain    Closed fracture of head of left radius 06/29/2017   COPD (chronic obstructive pulmonary disease) ([place])    Coronary artery disease    s/p Inf MI in 2000 tx with PCI to RCA // Myoview in 2019 with inf-lat scar and mild peri-infarct ischemia   Emphysema    GERD (gastroesophageal reflux disease)    History of alcohol abuse    RECOVERING SINCE 2000   History of Left renal mass 03/02/2012   underwent partial nephrectomy   HTN (hypertension)    Idiopathic acute facial nerve palsy    LEFT SIDE--  BOTOX THERAPY   Inferior MI ([place]) 2000   --  POST PTCA W/ STENT RCA   Ischemic cardiomyopathy    EF returned to  normal   Osteoporosis    Other and unspecified general anesthetics causing adverse effect in therapeutic use    post op delirium--  last anes record w/ chart  from   09-22-2009 (spinal w/ light sedation)   Preoperative clearance 06/13/2012   Renal cell carcinoma ([place]) 07/09/2012   Left mass   Rosacea    LEFT FACIAL RASH   S/P radiation therapy 02/21/2012   38.75 Gy HDR 5 Fractions- vaginal cuff   Scoliosis    Seizures ([facility])    x 1 after abrupt discontinuation of  Clonidine   Status post carotid endarterectomy 1995   Right   Unstable balance    WALKS W/ CANE   Vaginal cancer ([place])     Past Surgical History:  Procedure Laterality Date   BREAST EXCISIONAL BIOPSY Left 2019   b9 axilla Bx X 3   CAROTID ENDARTERECTOMY  1995   RIGHT   CATARACT EXTRACTION W/ INTRAOCULAR LENS  IMPLANT, BILATERAL     CERVICAL CONIZATION W/BX  09/23/2008    CORONARY ANGIOPLASTY WITH STENT PLACEMENT N/A 2000   X1 STENT TO RCA -   INFERIOR MI   EUS N/A 03/05/2012   Procedure: FULL UPPER ENDOSCOPIC ULTRASOUND (EUS) RADIAL and EGD;  Surgeon: Milus Banister, MD;  Location: [facility];  Service: Endoscopy;  Laterality: N/A;  ercp scope first than eus scope   HEMIARTHROPLASTY HIP  12/26/2008   LEFT FEMORAL NECK FX   LEFT HEART CATH AND CORONARY ANGIOGRAPHY N/A 02/08/2021   Procedure: LEFT HEART CATH AND CORONARY ANGIOGRAPHY;  Surgeon: Martinique, Peter M, MD;  Location: [place] [facility];  Service: Cardiovascular;  Laterality: N/A;   ORIF HIP FRACTURE  02/13/2007   RIGHT FEMORAL NECK FX   ORIF RIGHT DISTAL RADIUS AND RIGHT PROXIMAL HUMEROUS NECK FX'S  10/10/2005   POSTERIOR CERVICAL LAMINECTOMY N/A 08/28/2020   Procedure: Laminectomy and Foraminotomy - Cervical Two-Three, Cervical Three-Four, with lateral mass fusion/ fixation;  Surgeon: Dawley, Theodoro Doing, DO;  Location: [place];  Service: Neurosurgery;  Laterality: N/A;   RIGHT SHOULDER SURG.  2007   ROBOTIC ASSITED PARTIAL NEPHRECTOMY Left 07/09/2012   Procedure: ROBOTIC ASSITED PARTIAL NEPHRECTOMY;  Surgeon: Dutch Gray, MD;  Location: [facility];  Service: Urology;  Laterality: Left;   TOTAL HIP ARTHROPLASTY  04/15/2008   POST FAILED  RIGHT HIP ORIF FEMORAL FX   TOTAL KNEE ARTHROPLASTY  09/22/2009   RIGHT   UPPER RIGHT VAGINAL REGION  12/28/2011   BIOPSY: SQUAMOUS CELL CARCINOMA   VAGINAL HYSTERECTOMY  07/06/2009   Secondary to dysplasia     Home Medications:  Prior to Admission medications   Medication Sig Start Date End Date Taking? Authorizing Provider  acetaminophen (TYLENOL) 325 MG tablet Take 2 tablets (650 mg total) by mouth every 4 (four) hours as needed for mild pain ((score 1 to 3) or temp > 100.5). 09/17/20  Yes Angiulli, Lavon Paganini, PA-C  albuterol (PROVENTIL) (2.5 MG/3ML) 0.083% nebulizer solution INAHLE 3 ML'S ( 1 VIAL) VIA NEBULIZER EVERY 6 HOURS AS NEEDED FOR WHEEZING OR FOR SHORTNESS OF  BREATH Patient taking differently: Take 2.5 mg by nebulization every 6 (six) hours as needed for shortness of breath or wheezing. 06/23/21  Yes Icard, Bradley L, DO  albuterol (VENTOLIN HFA) 108 (90 Base) MCG/ACT inhaler Inhale 2 puffs into the lungs every 6 (six) hours as needed for wheezing. 09/17/20  Yes Angiulli, Lavon Paganini, PA-C  amLODipine (NORVASC) 5 MG tablet TAKE ONE TABLET BY MOUTH DAILY Patient taking  differently: Take 5 mg by mouth daily. 06/16/21  Yes Martinique, Peter M, MD  arformoterol (BROVANA) 15 MCG/2ML NEBU Take 2 mLs (15 mcg total) by nebulization 2 (two) times daily. 06/25/21  Yes Cobb, Karie Schwalbe, NP  ARIPiprazole (ABILIFY) 10 MG tablet Take 1 tablet (10 mg total) by mouth daily. 09/17/20  Yes Angiulli, Lavon Paganini, PA-C  Ascorbic Acid (VITAMIN C) 1000 MG tablet Take 1,000 mg by mouth daily.   Yes [provider]  aspirin EC 81 MG tablet Take 81 mg by mouth every morning.   Yes [provider]  atorvastatin (LIPITOR) 20 MG tablet Take 1 tablet (20 mg total) by mouth daily. 03/19/21 03/14/22 Yes Goodrich, Callie E, PA-C  bisoprolol (ZEBETA) 5 MG tablet TAKE 1/2 TABLET BY MOUTH DAILY Patient taking differently: Take 2.5 mg by mouth daily. 04/28/21  Yes Martinique, Peter M, MD  budesonide (PULMICORT) 0.5 MG/2ML nebulizer solution Take 2 mLs (0.5 mg total) by nebulization in the morning and at bedtime. 06/25/21  Yes Cobb, Karie Schwalbe, NP  CADUET 10-20 MG tablet TAKE 1 TABLET BY MOUTH DAILY. Patient taking differently: Take 1 tablet by mouth daily. 05/06/21  Yes Martinique, Peter M, MD  Calcium Carb-Cholecalciferol 600-400 MG-UNIT TABS Take 1 tablet by mouth daily. 09/17/20  Yes Angiulli, Lavon Paganini, PA-C  celecoxib (CELEBREX) 200 MG capsule Take 400 mg by mouth daily.   Yes [provider]  Cholecalciferol (VITAMIN D3) 50 MCG (2000 UT) TABS Take 2,000 Units by mouth daily. 09/17/20  Yes Angiulli, Lavon Paganini, PA-C  clopidogrel (PLAVIX) 75 MG tablet Take 1 tablet (75 mg total) by mouth daily.  03/08/21  Yes Martinique, Peter M, MD  denosumab (PROLIA) 60 MG/ML SOSY injection Inject 60 mg into the skin every 6 (six) months. 05/23/19  Yes [provider]  dexlansoprazole (DEXILANT) 60 MG capsule Take 1 capsule (60 mg total) by mouth daily. 09/17/20  Yes Angiulli, Lavon Paganini, PA-C  docusate sodium (COLACE) 100 MG capsule Take 100 mg by mouth daily as needed for mild constipation.   Yes [provider]  empagliflozin (JARDIANCE) 10 MG TABS tablet Take 1 tablet (10 mg total) by mouth daily. 02/10/21  Yes Martinique, Peter M, MD  ferrous sulfate 325 (65 FE) MG tablet Take 1 tablet (325 mg total) by mouth daily. 09/17/20  Yes Angiulli, Lavon Paganini, PA-C  furosemide (LASIX) 20 MG tablet Take 1 tablet (20 mg total) by mouth daily. 09/17/20  Yes Angiulli, Lavon Paganini, PA-C  hydrALAZINE (APRESOLINE) 25 MG tablet TAKE ONE TABLET BY MOUTH TWICE A DAY Patient taking differently: Take 25 mg by mouth in the morning and at bedtime. 05/24/21  Yes Martinique, Peter M, MD  lamoTRIgine (LAMICTAL) 100 MG tablet Take 2 tablets (200 mg total) by mouth 2 (two) times daily. 09/17/20  Yes Angiulli, Lavon Paganini, PA-C  montelukast (SINGULAIR) 10 MG tablet Take 1 tablet (10 mg total) by mouth at bedtime. TAKE 1 TABLET BY MOUTH EVERYDAY AT BEDTIME Patient taking differently: Take 10 mg by mouth at bedtime. 05/13/21  Yes Icard, Octavio Graves, DO  Multiple Vitamin (MULTIVITAMIN) capsule Take 1 capsule by mouth daily.    Yes [provider]  nitroGLYCERIN (NITROSTAT) 0.4 MG SL tablet Place 1 tablet (0.4 mg total) under the tongue every 5 (five) minutes as needed for chest pain. 05/25/21  Yes Martinique, Peter M, MD  Omega-3 Fatty Acids (FISH OIL) 1000 MG CAPS Take 1,000 mg by mouth 2 (two) times daily.   Yes [provider]  Oxycodone HCl 10 MG TABS Take 1 tablet (10 mg total) by mouth every 4 (four) hours as needed for severe pain. 09/17/20  Yes Angiulli, Lavon Paganini, PA-C  OXYGEN Inhale 2 L into the lungs continuous.   Yes [provider]  Polyethyl Glycol-Propyl Glycol (SYSTANE) 0.4-0.3 % GEL ophthalmic gel Place 1 application into both eyes daily as needed (dry eyes).   Yes [provider]  potassium chloride (KLOR-CON) 8 MEQ tablet Take 1 tablet (8 mEq total) by mouth daily. 10/26/20  Yes Martinique, Peter M, MD  predniSONE (DELTASONE) 10 MG tablet Take 10 mg by mouth daily with breakfast. 40 mg x 4 days, 30 mg x 3 day, 20 mg x 2 days, then back to regular daily dose   Yes [provider]  revefenacin (YUPELRI) 175 MCG/3ML nebulizer solution Take 3 mLs (175 mcg total) by nebulization daily. 06/25/21  Yes Cobb, Karie Schwalbe, NP  roflumilast (DALIRESP) 500 MCG TABS tablet Take 500 mcg by mouth daily.   Yes [provider]  sacubitril-valsartan (ENTRESTO) 49-51 MG Take 1 tablet by mouth 2 (two) times daily. 03/15/21  Yes Martinique, Peter M, MD  sertraline (ZOLOFT) 100 MG tablet Take 150 mg by mouth daily. Take 150 mg per patient.   Yes [provider]  tiZANidine (ZANAFLEX) 4 MG tablet Take 1 tablet (4 mg total) by mouth every 6 (six) hours. 09/17/20  Yes Angiulli, Lavon Paganini, PA-C  predniSONE (DELTASONE) 10 MG tablet Take 1 tablet (10 mg total) by mouth daily with breakfast. 06/07/21   Icard, Octavio Graves, DO  roflumilast (DALIRESP) 500 MCG TABS tablet Take 0.5 tablets (250 mcg total) by mouth daily for 28 days, THEN 1 tablet (500 mcg total) daily. Patient not taking: Reported on 06/26/2021 04/07/21 11/01/21  Clayton Bibles, NP    Inpatient Medications: Scheduled Meds:  albuterol  2.5 mg Nebulization QID   arformoterol  15 mcg Nebulization BID   aspirin EC  81 mg Oral q morning   atorvastatin  20 mg Oral Daily   budesonide  0.5 mg Nebulization BID   enoxaparin (LOVENOX) injection  40 mg Subcutaneous Q24H   furosemide  20 mg Intravenous Once   montelukast  10 mg Oral QHS   revefenacin  175 mcg Nebulization Daily   roflumilast  500 mcg Oral Daily   sodium chloride flush  3 mL Intravenous Q12H    Continuous Infusions:  azithromycin (ZITHROMAX) 500 mg in sodium chloride 0.9 % 250 mL IVPB     [START ON 06/27/2021] azithromycin     [START ON 06/27/2021] cefTRIAXone (ROCEPHIN)  IV     PRN Meds:   Allergies:    Allergies  Allergen Reactions   Cephalexin Diarrhea    Other reaction(s): diarrhea   Doxycycline Diarrhea   Gabapentin Other (See Comments)    sleepiness   Morphine And Related Anxiety    Was confused when taking it    Social History:   Social History   Socioeconomic History   Marital status: Divorced    Spouse name: Not on file   Number of children: 0   Years of education: Not on file   Highest education level: Not on file  Occupational History   Occupation: retired  Tobacco Use   Smoking status: Former    Packs/day: 2.00    Years: 50.00    Total pack years: 100.00    Types: Cigarettes    Quit date: 01/17/2010    Years since quitting: 11.4  Smokeless tobacco: Never   Tobacco comments:    STATES QUIT SMOKING 01-17-2010  Vaping Use   Vaping Use: Never used  Substance and Sexual Activity   Alcohol use: Not Currently    Comment: RECOVERING ALCOHOLIC--   QUIT IN 4332   Drug use: No   Sexual activity: Never  Other Topics Concern   Not on file  Social History Narrative   Not on file   Social Determinants of Health   Financial Resource Strain: Not on file  Food Insecurity: Not on file  Transportation Needs: Not on file  Physical Activity: Not on file  Stress: Not on file  Social Connections: Not on file  Intimate Partner Violence: Not on file    Family History:    Family History  Problem Relation Age of Onset   Hypertension Mother    Hypertension Father    Cancer Maternal Grandfather        type unknown   Hypertension Sister      ROS:  Please see the history of present illness.  All other ROS reviewed and negative.     Physical Exam/Data:   Vitals:   06/26/21 0635 06/26/21 0645 06/26/21 0801 06/26/21 1025  BP:  (!) 94/58     Pulse: (!) 59 62 67   Resp: '16 15 18   '$ Temp:      TempSrc:      SpO2: 93% 96% 98% 97%   No intake or output data in the 24 hours ending 06/26/21 1059    06/25/2021    3:52 PM 05/25/2021    1:12 PM 05/10/2021   10:18 AM  Last 3 Weights  Weight (lbs) 195 lb 191 lb 190 lb  Weight (kg) 88.451 kg 86.637 kg 86.183 kg     There is no height or weight on file to calculate BMI.  General:  Well nourished, well developed, in no acute distress on BiPAP HEENT: normal Neck: JVD elevated but difficult to measure because of body habitus and BiPAP straps Vascular: No carotid bruits; Distal pulses 2+ bilaterally Cardiac:  normal S1, S2; RRR; no murmur  Lungs: Decreased breath sounds bases with some rales bilaterally, no wheezing, rhonchi  Abd: soft, protuberant but nontender, no hepatomegaly  Ext: no edema Musculoskeletal:  No deformities, BUE and BLE strength weak but equal Skin: warm and dry  Neuro:  CNs 2-12 intact, no focal abnormalities noted Psych:  Normal affect   EKG:  The EKG was personally reviewed and demonstrates: Sinus rhythm, heart rate 68, inferior Q waves are old, diffuse T wave flattening noted Telemetry:  Telemetry was personally reviewed and demonstrates: Sinus rhythm  Relevant CV Studies:  ECHO: ORDERED  ECHO: 02/07/2021  1. Septal apical and inferior basal hypokinesis . Left ventricular  ejection fraction, by estimation, is 40 to 45%. The left ventricle has  mildly decreased function. The left ventricle demonstrates regional wall  motion abnormalities (see scoring diagram/findings for description). The left ventricular internal cavity size was mildly dilated. There is mild left ventricular hypertrophy. Left ventricular diastolic parameters were normal.   2. Right ventricular systolic function is normal. The right ventricular  size is normal. There is mildly elevated pulmonary artery systolic  pressure.   3. Left atrial size was moderately dilated.   4. The mitral valve  is abnormal. Mild mitral valve regurgitation. No  evidence of mitral stenosis. Moderate mitral annular calcification.   5. Calcified non coronary cusp. The aortic valve is tricuspid. There is  mild calcification of  the aortic valve. There is mild thickening of the  aortic valve. Aortic valve regurgitation is not visualized. Aortic valve  sclerosis is present, with no  evidence of aortic valve stenosis.   6. The inferior vena cava is normal in size with greater than 50%  respiratory variability, suggesting right atrial pressure of 3 mmHg.   CARDIAC CATH: 02/08/2021   Prox LAD lesion is 80% stenosed.   Mid LAD lesion is 70% stenosed.   2nd Mrg lesion is 70% stenosed.   Ost RCA to Prox RCA lesion is 20% stenosed.   Prox RCA to Mid RCA lesion is 60% stenosed.   Dist RCA lesion is 85% stenosed.   LV end diastolic pressure is moderately elevated.   3 vessel obstructive CAD. Patient's arteries are severely calcified and tortuous. There is a 80% stenosis in the LAD just proximal to the first septal perforator and a segemental 70% lesion in the mid vessel. There is a 70% stenosis of the second OM. The stent in the proximal RCA is patent but there is a 60% stenosis in the mid vessel and an 85% eccentric calcified nodular lesion in the distal RCA Moderately elevated LVEDP 29 mm Hg.   Plan: patient is not a candidate for CABG given age and multiple co-morbidities including significant debility. PCI would be very difficult given severe calcification and tortuosity of vessels. I would recommend aggressive medical therapy especially since she denies any angina. Will add bisoprolol 5 mg daily and Plavix 75 mg daily for ACS indication.  Diagnostic Dominance: Right   Laboratory Data:  High Sensitivity Troponin:  No results for input(s): "TROPONINIHS" in the last 720 hours.   Chemistry Recent Labs  Lab 06/26/21 0457 06/26/21 0501 06/26/21 0509  NA 136  138 137 136  K 4.1  4.0 4.1 4.2  CL 104   104 104  --   CO2 22  --   --   GLUCOSE 84  83 80  --   BUN 31*  35* 37*  --   CREATININE 1.08*  1.00 1.00  --   CALCIUM 10.5*  --   --   GFRNONAA 53*  --   --   ANIONGAP 10  --   --     Recent Labs  Lab 06/26/21 0457  PROT 6.0*  ALBUMIN 3.5  AST 23  ALT 27  ALKPHOS 99  BILITOT 0.6   Lipids No results for input(s): "CHOL", "TRIG", "HDL", "LABVLDL", "LDLCALC", "CHOLHDL" in the last 168 hours.  Hematology Recent Labs  Lab 06/26/21 0457 06/26/21 0501 06/26/21 0509  WBC 17.4*  --   --   RBC 4.66  --   --   HGB 15.3*  16.0* 16.0* 16.0*  HCT 45.2  47.0* 47.0* 47.0*  MCV 97.0  --   --   MCH 32.8  --   --   MCHC 33.8  --   --   RDW 14.3  --   --   PLT 251  --   --    Thyroid No results for input(s): "TSH", "FREET4" in the last 168 hours.  BNP Recent Labs  Lab 06/26/21 0457  BNP 186.1*    DDimer No results for input(s): "DDIMER" in the last 168 hours.   Radiology/Studies:  DG Chest Port 1 View  Result Date: 06/26/2021 CLINICAL DATA:  Shortness of breath. EXAM: PORTABLE CHEST 1 VIEW COMPARISON:  06/25/2021 FINDINGS: Stable cardiomediastinal contours. Aortic atherosclerosis. Interval progression of interstitial edema pattern with a lower  lung zone predominance. No pleural effusion identified. There are a few patchy opacities within the right upper lobe which appear new from previous exam. The visualized osseous structures are intact. Previous ORIF of the proximal right humerus. IMPRESSION: 1. Interval progression of interstitial edema pattern. 2. New patchy opacities within the right upper lobe, this may represent an area of pneumonia or asymmetric edema. Electronically Signed   By: Kerby Moors M.D.   On: 06/26/2021 05:55     Assessment and Plan:   Acute systolic CHF -Troponins have not yet been ordered, will check - During her MI January 2023, her peak troponin was 1233, her only symptom at that time was shortness of breath - An echo has been ordered, follow-up  on results - Because of initial hypotension, she has not been given Lasix - At home, she was taking 20 mg once a day of Lasix. - Discuss with MD giving Lasix 40 mg IV twice daily  2.  History of non-STEMI with shortness of breath her presenting symptom, 01/2021 - Cath report as above, at that time, she was not a CABG candidate and due to calcified vessels and tortuosity, there were no good options for PCI -We will cycle enzymes - If troponin comes back elevated, and heparin   Risk Assessment/Risk Scores:      New York Heart Association (NYHA) Functional Class NYHA Class IV   For questions or updates, please contact [facility] Please consult [email or web address] for contact info under   Signed, Rosaria Ferries, PA-C  06/26/2021 10:59 AM As above, patient seen and examined.  Briefly she is a 76 year old female with past medical history of coronary artery disease status post PCI of the RCA in 2000, hypertension, chronic combined systolic/diastolic ingestive heart failure, severe COPD home O2 dependent, renal cell cancer status post partial nephrectomy, vaginal cancer, history of carotid endarterectomy, no CODE BLUE for evaluation of acute on chronic combined systolic/diastolic congestive heart failure.  Patient had a non-ST elevation myocardial infarction in January 2023.  Cardiac catheterization at that time revealed 80% proximal LAD, 70% mid LAD, 70% OM 2, 60% proximal right coronary artery, 85% distal right coronary artery; elevated left ventricular end-diastolic pressure.  Patient was not felt to be candidate for coronary artery bypass and graft due to comorbidities including home O2 dependent COPD.  It was also felt that PCI would be very difficult and medical therapy recommended.  Patient is on 2 L of oxygen at home.  She typically does not have dyspnea on exertion, orthopnea, PND, pedal edema, exertional chest pain or syncope.  She awoke this morning and went to the bathroom.  She developed  severe dyspnea that would not resolve.  There was no associated chest pain, palpitations.  She has not had fevers, chills or productive cough.  She presented to the emergency room and cardiology asked to evaluate.  She has improved with BiPAP.  Chest x-ray shows edema.  BNP 186.  Hemoglobin 16.  White blood cell count 17.4.  Electrocardiogram shows sinus rhythm, anterior and inferior infarct.  Is unchanged compared to April 01, 2021.  1 acute on chronic combined systolic/diastolic congestive heart failure-patient developed acute pulmonary edema (on top of baseline severe emphysema and restrictive lung disease with poor reserve).  We will treat with Lasix 40 mg IV daily and follow renal function.  I am concerned that it may have been ischemia mediated given the acute onset.  We will cycle enzymes.  Repeat echocardiogram.  As outlined above she had  a prior cardiac catheterization in January and was felt not to be a candidate for coronary artery bypass and graft and PCI would be very difficult.  We will therefore plan medical therapy.  We will continue with aspirin, Plavix, statin and enoxaparin 40 mg daily.  Resume bisoprolol later if blood pressure allows.  If enzymes positive we will treat with 48 hours of IV heparin.  2 hyperlipidemia-continue statin.  3 hypertension-blood pressure has been borderline.  We will hold amlodipine, Entresto and beta-blocker for now.  Will resume pending follow-up blood pressures.  4 ischemic cardiomyopathy-we will resume Entresto and bisoprolol later as blood pressure allows.  Kirk Ruths, MD

## 2021-06-26 NOTE — Progress Notes (Signed)
Pt transferred on bipap from room 20 to 1 in the ED without complications.

## 2021-06-26 NOTE — Consult Note (Addendum)
Consultation Note Date: 06/26/2021   Patient Name: [name]  DOB: July 28, 1945  MRN: [id number]  Age / Sex: 76 y.o., female  PCP: Jade Stains, MD Referring Physician: Norval Morton, MD  Reason for Consultation: Establishing goals of care, "CHF and COPD exacerbations with possible pneumonia."  HPI/Patient Profile: 76 y.o. female  with past medical history of COPD/emphysema on 2 L of oxygen, CAD s/p PCI, systolic CHF last EF 65-53%, and RCC s/p nephrectomy presented to ED on 06/26/2021 from home with complaints of shortness of breath.  With EMS and arrival to ED patient was found to be hypotensive.  She required CPAP with EMS and was subsequently placed on nonrebreather in ER but needed additional support and was put on BiPAP. Patient was admitted admitted on 06/26/2021 with acute on chronic respiratory failure with hypoxia secondary to heart failure with reduced ejection fraction, severe COPD with exacerbation, possible community-acquired pneumonia, leukocytosis, hypotension, CAD (previously deemed not a CABG candidate and managing medically).   Of note, patient is followed by [facility]'s outpatient palliative care.  Clinical Assessment and Goals of Care: I have reviewed medical records including EPIC notes, labs, and imaging. Received report from primary RN -no acute concerns.  Patient is off BiPAP now on nasal cannula.  Went to visit patient at bedside -no family/visitors present. Patient was lying in bed awake, alert, oriented, and able to participate in conversation. No signs or non-verbal gestures of pain or discomfort noted. No respiratory distress, increased work of breathing, or secretions noted.  Patient denies pain or shortness of breath at this time.  States she feels much better.  Met with patient to discuss diagnosis, prognosis, GOC, EOL wishes, disposition, and options.  I re-introduced  Palliative Medicine as specialized medical care for people living with serious illness. It focuses on providing relief from the symptoms and stress of a serious illness. The goal is to improve quality of life for both the patient and the family.  We discussed a brief life review of the patient as well as functional and nutritional status.  Patient is not married and she does not have any children.  Prior to hospitalization she was living in a private residence with a 24/7 caregiver/best friend/[name].  Patient is able to ambulate with a walker; however, does need assistance with dressing/bathing.  She does not receive any other home health services.  She feels her appetite at home has been good.  We discussed patient's current illness and what it means in the larger context of patient's on-going co-morbidities.  Patient understands that CHF and COPD is a progressive, non-curable disease underlying the patient's current acute medical conditions. She felt she didn't have a clear understanding of her acute medical situation - reviewed. Natural disease trajectory and expectations at EOL were discussed. I attempted to elicit values and goals of care important to the patient. The difference between aggressive medical intervention and comfort care was considered in light of the patient's goals of care.  Patient tells me that her cardiologist/Dr. Martinique gave her a prognosis of 6 months to 1 year at best.  Created space and opportunity for patient to express her thoughts and feelings regarding prognosis.  She states she is "not surprised" but that it is "still hard."  Discussed her thoughts around hospice care.  She is not familiar with what hospice has to offer. Provided education and counseling at length on the philosophy and benefits of hospice care. Discussed that it offers a holistic approach to care in  the setting of end-stage illness, and is about supporting the patient where they are allowing nature to take it's  course. Discussed the hospice team includes RNs, physicians, social workers, and chaplains. They can provide personal care, support for the family, and help keep patient out of the hospital as well as assist with DME needs for home hospice. Education provided on the difference between home vs residential hospice.  Patient is clear that after she is discharged she would not want rehospitalization and therefore feels that hospice would be of benefit and is open to this service at discharge.  She states that "I am ready."  Reviewed that patient is already receiving 24/7 caregiving at home and she has no concerns about continuing this care with the addition of hospice after discharge.  Patient's preference is to return home with hospice and continue 24/7 caregiving.  Patient is already receiving outpatient palliative care with [facility] and would like to receive hospice care with this organization.  Goal while patient remains hospitalized is to treat the treatable to help her feel as best she can prior to discharge.  She confirms HCPOA to be "friend close enough to be Mudlogger.  Discussed with patient the importance of continued conversation with each other and the medical providers regarding overall plan of care and treatment options, ensuring decisions are within the context of the patient's values and GOCs.    Questions and concerns were addressed. The patient/family was encouraged to call with questions and/or concerns. PMT card was provided.   Primary Decision Maker: PATIENT    SUMMARY OF RECOMMENDATIONS   Treat the treatable during this admission to help her feel as best she can prior to discharge Continue DNR/DNI as previously documented Once medically stable, goal is for discharge home with hospice. She is clear she does not want to be rehospitalized. Patient has 24/7 caregiver support already in the home and expresses no concern returning home with hospice [facility]  consult placed for: home hospice Patient already enrolled with [facility] outpatient [facility] Care - notified liaison of goal for home hospice PMT will continue to follow and support holistically    Code Status/Advance Care Planning: DNR  Palliative Prophylaxis:  Aspiration, Bowel Regimen, Delirium Protocol, Frequent Pain Assessment, Oral Care, and Turn Reposition  Additional Recommendations (Limitations, Scope, Preferences): Full Scope Treatment  Psycho-social/Spiritual:  Desire for further Chaplaincy support:no Created space and opportunity for patient to express thoughts and feelings regarding patient's current medical situation.  Emotional support and therapeutic listening provided.  Prognosis:  < 6 months  Discharge Planning: Home with Hospice      Primary Diagnoses: Present on Admission:  Acute on chronic respiratory failure with hypoxia (HCC)  (Resolved) HTN (hypertension)  Transient hypotension  COPD with acute exacerbation (HCC)  Chronic pain due to trauma  Heart failure with reduced ejection fraction (HCC)  Leukocytosis  CKD (chronic kidney disease), stage III (HCC)  Coronary artery disease  GERD (gastroesophageal reflux disease)  Hyperlipidemia   I have reviewed the medical record, interviewed the patient and family, and examined the patient. The following aspects are pertinent.  Past Medical History:  Diagnosis Date   (HFpEF) heart failure with preserved ejection fraction ([place]) 02/24/2017   Echo 05/2020: EF 55, severe inferior HK, mild LVH, Gr1 DD, normal RVSF, RVSP 32.4, mild BAE, mild MR, AV sclerosis without stenosis   Abdominal pain, acute, epigastric 03/02/2012   Acute congestive heart failure ([place]) 02/24/2017   Anxiety and depression    Arthritis  BACK   Bipolar disorder ([place])    Blepharospasm    LEFT EYE   CAP (community acquired pneumonia) 03/01/2021   Chronic back pain    Closed fracture of head of left radius 06/29/2017   COPD  (chronic obstructive pulmonary disease) (HCC)    Coronary artery disease    s/p Inf MI in 2000 tx with PCI to RCA // Myoview in 2019 with inf-lat scar and mild peri-infarct ischemia   Emphysema    GERD (gastroesophageal reflux disease)    History of alcohol abuse    RECOVERING SINCE 2000   History of Left renal mass 03/02/2012   underwent partial nephrectomy   HTN (hypertension)    Idiopathic acute facial nerve palsy    LEFT SIDE--  BOTOX THERAPY   Inferior MI ([place]) 2000   --  POST PTCA W/ STENT RCA   Ischemic cardiomyopathy    EF returned to normal   Osteoporosis    Other and unspecified general anesthetics causing adverse effect in therapeutic use    post op delirium--  last anes record w/ chart  from   09-22-2009 (spinal w/ light sedation)   Preoperative clearance 06/13/2012   Renal cell carcinoma ([place]) 07/09/2012   Left mass   Rosacea    LEFT FACIAL RASH   S/P radiation therapy 02/21/2012   38.75 Gy HDR 5 Fractions- vaginal cuff   Scoliosis    Seizures (HCC)    x 1 after abrupt discontinuation of  Clonidine   Status post carotid endarterectomy 1995   Right   Unstable balance    WALKS W/ CANE   Vaginal cancer ([place])    Social History   Socioeconomic History   Marital status: Divorced    Spouse name: Not on file   Number of children: 0   Years of education: Not on file   Highest education level: Not on file  Occupational History   Occupation: retired  Tobacco Use   Smoking status: Former    Packs/day: 2.00    Years: 50.00    Total pack years: 100.00    Types: Cigarettes    Quit date: 01/17/2010    Years since quitting: 11.4   Smokeless tobacco: Never   Tobacco comments:    STATES QUIT SMOKING 01-17-2010  Vaping Use   Vaping Use: Never used  Substance and Sexual Activity   Alcohol use: Not Currently    Comment: RECOVERING ALCOHOLIC--   QUIT IN 3419   Drug use: No   Sexual activity: Never  Other Topics Concern   Not on file  Social History Narrative    Not on file   Social Determinants of Health   Financial Resource Strain: Not on file  Food Insecurity: Not on file  Transportation Needs: Not on file  Physical Activity: Not on file  Stress: Not on file  Social Connections: Not on file   Family History  Problem Relation Age of Onset   Hypertension Mother    Hypertension Father    Cancer Maternal Grandfather        type unknown   Hypertension Sister    Scheduled Meds:  albuterol  2.5 mg Nebulization QID   arformoterol  15 mcg Nebulization BID   ARIPiprazole  10 mg Oral Daily   aspirin EC  81 mg Oral q morning   atorvastatin  20 mg Oral Daily   budesonide  0.5 mg Nebulization BID   clopidogrel  75 mg Oral Daily   enoxaparin (LOVENOX) injection  40 mg Subcutaneous Q24H   furosemide  40 mg Intravenous Daily   lamoTRIgine  200 mg Oral BID   montelukast  10 mg Oral QHS   pantoprazole  40 mg Oral Daily   [START ON 06/27/2021] predniSONE  60 mg Oral Q breakfast   revefenacin  175 mcg Nebulization Daily   roflumilast  500 mcg Oral Daily   sertraline  150 mg Oral Daily   sodium chloride flush  3 mL Intravenous Q12H   Continuous Infusions:  [START ON 06/27/2021] azithromycin     [START ON 06/27/2021] cefTRIAXone (ROCEPHIN)  IV     PRN Meds:.acetaminophen **OR** acetaminophen, albuterol, artificial tears, docusate sodium, fentaNYL (SUBLIMAZE) injection, oxyCODONE, tiZANidine Medications Prior to Admission:  Prior to Admission medications   Medication Sig Start Date End Date Taking? Authorizing Provider  acetaminophen (TYLENOL) 325 MG tablet Take 2 tablets (650 mg total) by mouth every 4 (four) hours as needed for mild pain ((score 1 to 3) or temp > 100.5). 09/17/20  Yes Angiulli, Lavon Paganini, PA-C  albuterol (PROVENTIL) (2.5 MG/3ML) 0.083% nebulizer solution INAHLE 3 ML'S ( 1 VIAL) VIA NEBULIZER EVERY 6 HOURS AS NEEDED FOR WHEEZING OR FOR SHORTNESS OF BREATH Patient taking differently: Take 2.5 mg by nebulization every 6 (six) hours as  needed for shortness of breath or wheezing. 06/23/21  Yes Icard, Bradley L, DO  albuterol (VENTOLIN HFA) 108 (90 Base) MCG/ACT inhaler Inhale 2 puffs into the lungs every 6 (six) hours as needed for wheezing. 09/17/20  Yes Angiulli, Lavon Paganini, PA-C  amLODipine (NORVASC) 5 MG tablet TAKE ONE TABLET BY MOUTH DAILY Patient taking differently: Take 5 mg by mouth daily. 06/16/21  Yes [name], Peter M, MD  arformoterol (BROVANA) 15 MCG/2ML NEBU Take 2 mLs (15 mcg total) by nebulization 2 (two) times daily. 06/25/21  Yes Cobb, Karie Schwalbe, NP  ARIPiprazole (ABILIFY) 10 MG tablet Take 1 tablet (10 mg total) by mouth daily. 09/17/20  Yes Angiulli, Lavon Paganini, PA-C  Ascorbic Acid (VITAMIN C) 1000 MG tablet Take 1,000 mg by mouth daily.   Yes [provider]  aspirin EC 81 MG tablet Take 81 mg by mouth every morning.   Yes [provider]  atorvastatin (LIPITOR) 20 MG tablet Take 1 tablet (20 mg total) by mouth daily. 03/19/21 03/14/22 Yes Goodrich, Callie E, PA-C  bisoprolol (ZEBETA) 5 MG tablet TAKE 1/2 TABLET BY MOUTH DAILY Patient taking differently: Take 2.5 mg by mouth daily. 04/28/21  Yes [name], Peter M, MD  budesonide (PULMICORT) 0.5 MG/2ML nebulizer solution Take 2 mLs (0.5 mg total) by nebulization in the morning and at bedtime. 06/25/21  Yes Cobb, Karie Schwalbe, NP  CADUET 10-20 MG tablet TAKE 1 TABLET BY MOUTH DAILY. Patient taking differently: Take 1 tablet by mouth daily. 05/06/21  Yes [name], Peter M, MD  Calcium Carb-Cholecalciferol 600-400 MG-UNIT TABS Take 1 tablet by mouth daily. 09/17/20  Yes Angiulli, Lavon Paganini, PA-C  celecoxib (CELEBREX) 200 MG capsule Take 400 mg by mouth daily.   Yes [provider]  Cholecalciferol (VITAMIN D3) 50 MCG (2000 UT) TABS Take 2,000 Units by mouth daily. 09/17/20  Yes Angiulli, Lavon Paganini, PA-C  clopidogrel (PLAVIX) 75 MG tablet Take 1 tablet (75 mg total) by mouth daily. 03/08/21  Yes [name], Peter M, MD  denosumab (PROLIA) 60 MG/ML SOSY injection Inject 60  mg into the skin every 6 (six) months. 05/23/19  Yes [provider]  dexlansoprazole (DEXILANT) 60 MG capsule Take 1 capsule (60 mg total)  by mouth daily. 09/17/20  Yes Angiulli, Lavon Paganini, PA-C  docusate sodium (COLACE) 100 MG capsule Take 100 mg by mouth daily as needed for mild constipation.   Yes [provider]  empagliflozin (JARDIANCE) 10 MG TABS tablet Take 1 tablet (10 mg total) by mouth daily. 02/10/21  Yes [name], Peter M, MD  ferrous sulfate 325 (65 FE) MG tablet Take 1 tablet (325 mg total) by mouth daily. 09/17/20  Yes Angiulli, Lavon Paganini, PA-C  furosemide (LASIX) 20 MG tablet Take 1 tablet (20 mg total) by mouth daily. 09/17/20  Yes Angiulli, Lavon Paganini, PA-C  hydrALAZINE (APRESOLINE) 25 MG tablet TAKE ONE TABLET BY MOUTH TWICE A DAY Patient taking differently: Take 25 mg by mouth in the morning and at bedtime. 05/24/21  Yes [name], Peter M, MD  lamoTRIgine (LAMICTAL) 100 MG tablet Take 2 tablets (200 mg total) by mouth 2 (two) times daily. 09/17/20  Yes Angiulli, Lavon Paganini, PA-C  montelukast (SINGULAIR) 10 MG tablet Take 1 tablet (10 mg total) by mouth at bedtime. TAKE 1 TABLET BY MOUTH EVERYDAY AT BEDTIME Patient taking differently: Take 10 mg by mouth at bedtime. 05/13/21  Yes Icard, Octavio Graves, DO  Multiple Vitamin (MULTIVITAMIN) capsule Take 1 capsule by mouth daily.    Yes [provider]  nitroGLYCERIN (NITROSTAT) 0.4 MG SL tablet Place 1 tablet (0.4 mg total) under the tongue every 5 (five) minutes as needed for chest pain. 05/25/21  Yes [name], Peter M, MD  Omega-3 Fatty Acids (FISH OIL) 1000 MG CAPS Take 1,000 mg by mouth 2 (two) times daily.   Yes [provider]  Oxycodone HCl 10 MG TABS Take 1 tablet (10 mg total) by mouth every 4 (four) hours as needed for severe pain. 09/17/20  Yes Angiulli, Lavon Paganini, PA-C  OXYGEN Inhale 2 L into the lungs continuous.   Yes [provider]  Polyethyl Glycol-Propyl Glycol (SYSTANE) 0.4-0.3 % GEL ophthalmic gel Place  1 application into both eyes daily as needed (dry eyes).   Yes [provider]  potassium chloride (KLOR-CON) 8 MEQ tablet Take 1 tablet (8 mEq total) by mouth daily. 10/26/20  Yes [name], Peter M, MD  predniSONE (DELTASONE) 10 MG tablet Take 10 mg by mouth daily with breakfast. 40 mg x 4 days, 30 mg x 3 day, 20 mg x 2 days, then back to regular daily dose   Yes [provider]  revefenacin (YUPELRI) 175 MCG/3ML nebulizer solution Take 3 mLs (175 mcg total) by nebulization daily. 06/25/21  Yes Cobb, Karie Schwalbe, NP  roflumilast (DALIRESP) 500 MCG TABS tablet Take 500 mcg by mouth daily.   Yes [provider]  sacubitril-valsartan (ENTRESTO) 49-51 MG Take 1 tablet by mouth 2 (two) times daily. 03/15/21  Yes [name], Peter M, MD  sertraline (ZOLOFT) 100 MG tablet Take 150 mg by mouth daily. Take 150 mg per patient.   Yes [provider]  tiZANidine (ZANAFLEX) 4 MG tablet Take 1 tablet (4 mg total) by mouth every 6 (six) hours. 09/17/20  Yes Angiulli, Lavon Paganini, PA-C  predniSONE (DELTASONE) 10 MG tablet Take 1 tablet (10 mg total) by mouth daily with breakfast. 06/07/21   Icard, Octavio Graves, DO  roflumilast (DALIRESP) 500 MCG TABS tablet Take 0.5 tablets (250 mcg total) by mouth daily for 28 days, THEN 1 tablet (500 mcg total) daily. Patient not taking: Reported on 06/26/2021 04/07/21 11/01/21  Clayton Bibles, NP   Allergies  Allergen Reactions   Cephalexin Diarrhea  Other reaction(s): diarrhea   Doxycycline Diarrhea   Gabapentin Other (See Comments)    sleepiness   Morphine And Related Anxiety    Was confused when taking it   Review of Systems  Constitutional:  Positive for activity change and fatigue. Negative for appetite change.  Respiratory:  Negative for shortness of breath.   Gastrointestinal:  Negative for nausea and vomiting.  Neurological:  Positive for weakness.  All other systems reviewed and are negative.   Physical Exam Vitals and nursing note  reviewed.  Constitutional:      General: She is not in acute distress. Pulmonary:     Effort: No respiratory distress.  Skin:    General: Skin is warm and dry.  Neurological:     Mental Status: She is alert and oriented to person, place, and time.     Motor: Weakness present.  Psychiatric:        Attention and Perception: Attention normal.        Behavior: Behavior is cooperative.        Cognition and Memory: Cognition and memory normal.     Vital Signs: BP 115/62   Pulse (!) 58   Temp 98.3 F (36.8 C) (Oral)   Resp (!) 21   SpO2 96%  Pain Scale: 0-10   Pain Score: 0-No pain   SpO2: SpO2: 96 % O2 Device:SpO2: 96 % O2 Flow Rate: .O2 Flow Rate (L/min): 15 L/min  IO: Intake/output summary: No intake or output data in the 24 hours ending 06/26/21 1446  LBM:   Baseline Weight:   Most recent weight:       Palliative Assessment/Data: PPS 40-50%     Time In: 1445 Time Out: 1615 Time Total: 90 minutes  Greater than 50%  of this time was spent counseling and coordinating care related to the above assessment and plan.  Signed by: Lin Landsman, NP   Please contact Palliative Medicine Team phone at [phone] for questions and concerns.  For individual provider: See Amion  *Portions of this note are a verbal dictation therefore any spelling and/or grammatical errors are due to the "[employer]" system interpretation.

## 2021-06-26 NOTE — Progress Notes (Signed)
ABG order was placed on pt. RT was about to stick the pt for ABG and the MD walked in and said to cancel that order. RT will monitor.

## 2021-06-27 ENCOUNTER — Inpatient Hospital Stay (HOSPITAL_COMMUNITY): Payer: Medicare Other

## 2021-06-27 DIAGNOSIS — J189 Pneumonia, unspecified organism: Secondary | ICD-10-CM | POA: Diagnosis not present

## 2021-06-27 DIAGNOSIS — J441 Chronic obstructive pulmonary disease with (acute) exacerbation: Secondary | ICD-10-CM | POA: Diagnosis not present

## 2021-06-27 DIAGNOSIS — I5020 Unspecified systolic (congestive) heart failure: Secondary | ICD-10-CM | POA: Diagnosis not present

## 2021-06-27 DIAGNOSIS — I5043 Acute on chronic combined systolic (congestive) and diastolic (congestive) heart failure: Secondary | ICD-10-CM | POA: Diagnosis not present

## 2021-06-27 DIAGNOSIS — I5021 Acute systolic (congestive) heart failure: Secondary | ICD-10-CM | POA: Diagnosis not present

## 2021-06-27 DIAGNOSIS — J9621 Acute and chronic respiratory failure with hypoxia: Secondary | ICD-10-CM | POA: Diagnosis not present

## 2021-06-27 LAB — CBC
HCT: 36.9 % (ref 36.0–46.0)
Hemoglobin: 13.3 g/dL (ref 12.0–15.0)
MCH: 34.9 pg — ABNORMAL HIGH (ref 26.0–34.0)
MCHC: 36 g/dL (ref 30.0–36.0)
MCV: 96.9 fL (ref 80.0–100.0)
Platelets: 223 10*3/uL (ref 150–400)
RBC: 3.81 MIL/uL — ABNORMAL LOW (ref 3.87–5.11)
RDW: 14.3 % (ref 11.5–15.5)
WBC: 17.6 10*3/uL — ABNORMAL HIGH (ref 4.0–10.5)
nRBC: 0 % (ref 0.0–0.2)

## 2021-06-27 LAB — BASIC METABOLIC PANEL
Anion gap: 12 (ref 5–15)
BUN: 30 mg/dL — ABNORMAL HIGH (ref 8–23)
CO2: 24 mmol/L (ref 22–32)
Calcium: 9.7 mg/dL (ref 8.9–10.3)
Chloride: 101 mmol/L (ref 98–111)
Creatinine, Ser: 1.25 mg/dL — ABNORMAL HIGH (ref 0.44–1.00)
GFR, Estimated: 45 mL/min — ABNORMAL LOW (ref >60)
Glucose, Bld: 139 mg/dL — ABNORMAL HIGH (ref 70–99)
Potassium: 3.7 mmol/L (ref 3.5–5.1)
Sodium: 137 mmol/L (ref 135–145)

## 2021-06-27 LAB — ECHOCARDIOGRAM COMPLETE
AR max vel: 2.51 cm2
AV Area VTI: 2.66 cm2
AV Area mean vel: 2.46 cm2
AV Mean grad: 4 mmHg
AV Peak grad: 7.8 mmHg
Ao pk vel: 1.4 m/s
Area-P 1/2: 2.43 cm2
S' Lateral: 3.2 cm
Weight: 3128.77 oz

## 2021-06-27 LAB — MRSA NEXT GEN BY PCR, NASAL: MRSA by PCR Next Gen: NOT DETECTED

## 2021-06-27 LAB — TROPONIN I (HIGH SENSITIVITY): Troponin I (High Sensitivity): 53 ng/L — ABNORMAL HIGH (ref <18)

## 2021-06-27 MED ORDER — FUROSEMIDE 20 MG PO TABS
20.0000 mg | ORAL_TABLET | Freq: Every day | ORAL | Status: DC
  Administered 2021-06-27: 20 mg via ORAL
  Filled 2021-06-27: qty 1

## 2021-06-27 MED ORDER — LEVOFLOXACIN 500 MG PO TABS
500.0000 mg | ORAL_TABLET | Freq: Every day | ORAL | Status: DC
  Administered 2021-06-27 – 2021-06-28 (×2): 500 mg via ORAL
  Filled 2021-06-27 (×2): qty 1

## 2021-06-27 MED ORDER — BISOPROLOL FUMARATE 5 MG PO TABS
2.5000 mg | ORAL_TABLET | Freq: Every day | ORAL | Status: DC
  Administered 2021-06-27 – 2021-06-28 (×2): 2.5 mg via ORAL
  Filled 2021-06-27 (×2): qty 0.5

## 2021-06-27 MED ORDER — ALBUTEROL SULFATE (2.5 MG/3ML) 0.083% IN NEBU: 2.5000 mg | INHALATION_SOLUTION | Freq: Four times a day (QID) | RESPIRATORY_TRACT | Status: DC | PRN

## 2021-06-27 NOTE — Progress Notes (Signed)
Progress Note  Patient Name: ENVI EAGLESON Date of Encounter: 06/27/2021  [facility] Cardiologist: Peter Martinique, MD   Subjective   No CP; dyspnea improved  Inpatient Medications    Scheduled Meds:  albuterol  2.5 mg Nebulization QID   arformoterol  15 mcg Nebulization BID   ARIPiprazole  10 mg Oral Daily   aspirin EC  81 mg Oral q morning   atorvastatin  20 mg Oral Daily   budesonide  0.5 mg Nebulization BID   clopidogrel  75 mg Oral Daily   enoxaparin (LOVENOX) injection  40 mg Subcutaneous Q24H   furosemide  40 mg Intravenous Daily   lamoTRIgine  200 mg Oral BID   mouth rinse  15 mL Mouth Rinse BID   montelukast  10 mg Oral QHS   pantoprazole  40 mg Oral Daily   predniSONE  60 mg Oral Q breakfast   revefenacin  175 mcg Nebulization Daily   roflumilast  500 mcg Oral Daily   sertraline  150 mg Oral Daily   sodium chloride flush  3 mL Intravenous Q12H   Continuous Infusions:  azithromycin     cefTRIAXone (ROCEPHIN)  IV     PRN Meds: acetaminophen **OR** acetaminophen, albuterol, artificial tears, docusate sodium, fentaNYL (SUBLIMAZE) injection, oxyCODONE, tiZANidine   Vital Signs    Vitals:   06/27/21 0100 06/27/21 0200 06/27/21 0348 06/27/21 0713  BP: (!) 133/55 139/67 140/74 (!) 148/69  Pulse:   84 71  Resp:   19 17  Temp:   97.8 F (36.6 C) 98.2 F (36.8 C)  TempSrc:   Oral Oral  SpO2:   93% 93%  Weight:   88.7 kg     Intake/Output Summary (Last 24 hours) at 06/27/2021 0734 Last data filed at 06/27/2021 0700 Gross per 24 hour  Intake 720 ml  Output 2600 ml  Net -1880 ml      06/27/2021    3:48 AM 06/25/2021    3:52 PM 05/25/2021    1:12 PM  Last 3 Weights  Weight (lbs) 195 lb 8.8 oz 195 lb 191 lb  Weight (kg) 88.7 kg 88.451 kg 86.637 kg      Telemetry    Sinus with PVCs and 3 beats NSVT- Personally Reviewed   Physical Exam   GEN: No acute distress.   Neck: No JVD Cardiac: RRR, no murmurs, rubs, or gallops.  Respiratory: Diminished  BS throughout GI: Soft, nontender, non-distended  MS: No edema Neuro:  Nonfocal  Psych: Normal affect   Labs    High Sensitivity Troponin:   Recent Labs  Lab 06/26/21 2158 06/27/21 0156  TROPONINIHS 56* 53*     Chemistry Recent Labs  Lab 06/26/21 0457 06/26/21 0501 06/26/21 0509 06/27/21 0017  NA 136  138 137 136 137  K 4.1  4.0 4.1 4.2 3.7  CL 104  104 104  --  101  CO2 22  --   --  24  GLUCOSE 84  83 80  --  139*  BUN 31*  35* 37*  --  30*  CREATININE 1.08*  1.00 1.00  --  1.25*  CALCIUM 10.5*  --   --  9.7  PROT 6.0*  --   --   --   ALBUMIN 3.5  --   --   --   AST 23  --   --   --   ALT 27  --   --   --   ALKPHOS 99  --   --   --  BILITOT 0.6  --   --   --   GFRNONAA 53*  --   --  45*  ANIONGAP 10  --   --  12     Hematology Recent Labs  Lab 06/26/21 0457 06/26/21 0501 06/26/21 0509 06/27/21 0017  WBC 17.4*  --   --  17.6*  RBC 4.66  --   --  3.81*  HGB 15.3*  16.0* 16.0* 16.0* 13.3  HCT 45.2  47.0* 47.0* 47.0* 36.9  MCV 97.0  --   --  96.9  MCH 32.8  --   --  34.9*  MCHC 33.8  --   --  36.0  RDW 14.3  --   --  14.3  PLT 251  --   --  223    BNP Recent Labs  Lab 06/26/21 0457  BNP 186.1*     Radiology    DG Chest Port 1 View  Result Date: 06/26/2021 CLINICAL DATA:  Shortness of breath. EXAM: PORTABLE CHEST 1 VIEW COMPARISON:  06/25/2021 FINDINGS: Stable cardiomediastinal contours. Aortic atherosclerosis. Interval progression of interstitial edema pattern with a lower lung zone predominance. No pleural effusion identified. There are a few patchy opacities within the right upper lobe which appear new from previous exam. The visualized osseous structures are intact. Previous ORIF of the proximal right humerus. IMPRESSION: 1. Interval progression of interstitial edema pattern. 2. New patchy opacities within the right upper lobe, this may represent an area of pneumonia or asymmetric edema. Electronically Signed   By: Kerby Moors M.D.    On: 06/26/2021 05:55     Patient Profile     76 year old female with past medical history of coronary artery disease status post PCI of the RCA in 2000, hypertension, chronic combined systolic/diastolic ingestive heart failure, severe COPD home O2 dependent, renal cell cancer status post partial nephrectomy, vaginal cancer, history of carotid endarterectomy, no CODE BLUE for evaluation of acute on chronic combined systolic/diastolic congestive heart failure.  Patient had a non-ST elevation myocardial infarction in January 2023.  Cardiac catheterization at that time revealed 80% proximal LAD, 70% mid LAD, 70% OM 2, 60% proximal right coronary artery, 85% distal right coronary artery; elevated left ventricular end-diastolic pressure.  Patient was not felt to be candidate for coronary artery bypass and graft due to comorbidities including home O2 dependent COPD.  It was also felt that PCI was not an option and medical therapy recommended.  Patient presented with acute onset dyspnea and CHF requiring BiPAP.  Assessment & Plan    1 acute on chronic combined systolic/diastolic congestive heart failure-patient developed acute pulmonary edema (on top of baseline severe emphysema and restrictive lung disease with poor reserve).  Troponins not consistent with acute coronary syndrome.  We will change Lasix to 20 mg by mouth daily.  Await follow-up echocardiogram.  As outlined previously she had a prior cardiac catheterization in January and was felt not to be a candidate for coronary artery bypass or PCI.  We will therefore plan medical therapy.  We will continue with aspirin, Plavix, statin and enoxaparin 40 mg daily.    2 hyperlipidemia-continue statin.   3 hypertension-blood pressure has improved.  We will resume bisoprolol.  Follow blood pressure and resume Entresto later if blood pressure and renal function allow.  4 ischemic cardiomyopathy-resume bisoprolol today.  If blood pressure allows will resume  lower dose Entresto tomorrow.  Note patient is no CODE BLUE and plan apparently is discharged to hospice.  For questions or  updates, please contact [place] Please consult [email or web address] for contact info under        Signed, Kirk Ruths, MD  06/27/2021, 7:34 AM

## 2021-06-27 NOTE — Progress Notes (Signed)
  Echocardiogram 2D Echocardiogram has been performed.  [name] 06/27/2021, 3:12 PM

## 2021-06-27 NOTE — Plan of Care (Signed)
  Problem: Activity: Goal: Capacity to carry out activities will improve Outcome: Progressing   Problem: Clinical Measurements: Goal: Respiratory complications will improve Outcome: Progressing Goal: Cardiovascular complication will be avoided Outcome: Progressing   Problem: Coping: Goal: Level of anxiety will decrease Outcome: Progressing   Problem: Pain Managment: Goal: General experience of comfort will improve Outcome: Progressing   Problem: Safety: Goal: Ability to remain free from injury will improve Outcome: Progressing

## 2021-06-27 NOTE — Progress Notes (Signed)
PROGRESS NOTE   [name]  HWE:[id number] DOB: [birth year] DOA: 06/26/2021 PCP: Harlan Stains, MD  Brief Narrative:   76 year old white female Severe COPD/emphysema CAD + inferior MI 2023 three-vessel disease, carotid stenosis + R RCA in 1995 PAD HFrEF -40 to 89% normal diastolic function  HTN HLD bipolar Renal cancer status post partial L nephrectomy 2014 vaginal cancer   Developed worsening SOB sound, DOE progressing over 1 week + productive cough - chills, fever,  6/2 with increased work of breathing--Rx prednisone for 5 days doxycycline-with tapering down, her respiratory status worsened 6/9 seen at pulmonology office given a taper once again of prednisone--advised to change the way that she takes her inhalers  Brought in by EMS for shortness of breath blood pressures 60s over 20s Initially was on BiPAP transitioned to nonrebreather WBC 17.4 hemoglobin 15 BNP 186 CXR interval progression of interstitial edema with new patchy infiltrates RUL  Rx Solu-Medrol DuoNeb Lasix Rocephin azithromycin Palliative care saw the patient and recommended DNR/DNI as patient wished to not be intubated Cardiology also saw the patient and made recommendations   Hospital-Problem based course  Acute hypoxic respiratory failure secondary to aspiration pneumonia  On steroids would explain elevated WBC-- narrow rapidly to Levaquin p.o. 500 daily and would complete a total of 5 days of this as she just completed doxycycline in the outpatient setting Follow sputum culture for completion purposes if obtainable Possibility of aspiration but not coughing at baseline and she is a good historian-hold speech consult DNR/DNI would not want to be intubated asking to go home so we will make sure she is stable over the next 24 hours and make that call Acute exacerbation of underlying severe emphysema/COPD Continue prednisone 60 daily Continue albuterol 2.5 nebs 4 times daily, Brovana 15 twice daily,  Pulmicort 0.5 twice daily, Singulair 10 at bedtime, Yupelri 175 daily, Daliresp 500 daily She has end-stage disease Severe CAD three-vessel disease nonoperable HFrEF 40-45% HTN Medications have been adjusted by cardiology as she was slightly hypotensive yesterday: Stopped amlodipine 5, Caduet, Entresto as well as Jardiance Continues on: Bisoprolol but at lower dose 2.5 daily, Lasix 20 p.o. daily continue Plavix 75 daily, Aspirin 81 Patient is -2.2 L from yesterday Will need to minimize meds on discharge home a little further-would not add back Entresto and I am discontinuing her Lipitor--the goal of her management is comfort-not longevity Depression Continue Abilify 10 daily, sertraline 150 daily Can continue Lamictal 200 twice daily Chronic pains Continue cautiously tizanidine 4 every 6 as needed Continue Oxy IR 10 every 4 as needed-discontinued fentanyl injections Underlying vaginal as well as renal cancer See above discussion   DVT prophylaxis: Lovenox Code Status: DNR/DNI Family Communication: None present at the bedside Disposition:  Status is: Inpatient Remains inpatient appropriate because: Requires stability prior to discharge back home   Consultants:    Procedures:   Antimicrobials:   Currently Levaquin  Subjective: Awake coherent no distress-she is on 4 L usually uses 2 L at she is in the process of getting her inhalers administered to her She has no chest pain Her shortness of breath is much better She has not been out of bed today Blood pressures were little low yesterday and somewhat low this morning  Objective: Vitals:   06/27/21 0100 06/27/21 0200 06/27/21 0348 06/27/21 0713  BP: (!) 133/55 139/67 140/74 (!) 148/69  Pulse:   84 71  Resp:   19 17  Temp:   97.8 F (36.6 C) 98.2 F (36.8 C)  TempSrc:   Oral Oral  SpO2:   93% 93%  Weight:   88.7 kg     Intake/Output Summary (Last 24 hours) at 06/27/2021 0716 Last data filed at 06/27/2021 0700 Gross  per 24 hour  Intake 720 ml  Output 2600 ml  Net -1880 ml   Filed Weights   06/27/21 0348  Weight: 88.7 kg    Examination:  EOMI NCAT no focal deficit  Some wheeze no rales no rhonchi but poor exam Sinus rhythm on monitors S1-S2 with mild JVD no lower extremity edema Abdomen is soft nontender no rebound no guarding Moving 4 limbs equally without deficit No rash  Data Reviewed: personally reviewed   CBC    Component Value Date/Time   WBC 17.6 (H) 06/27/2021 0017   RBC 3.81 (L) 06/27/2021 0017   HGB 13.3 06/27/2021 0017   HGB 13.9 02/16/2021 0910   HCT 36.9 06/27/2021 0017   HCT 37.7 02/16/2021 0910   PLT 223 06/27/2021 0017   PLT 283 02/16/2021 0910   MCV 96.9 06/27/2021 0017   MCV 93 02/16/2021 0910   MCH 34.9 (H) 06/27/2021 0017   MCHC 36.0 06/27/2021 0017   RDW 14.3 06/27/2021 0017   RDW 12.2 02/16/2021 0910   LYMPHSABS 1.4 06/26/2021 0457   LYMPHSABS 0.7 02/16/2021 0910   MONOABS 0.5 06/26/2021 0457   EOSABS 0.1 06/26/2021 0457   EOSABS 0.1 02/16/2021 0910   BASOSABS 0.0 06/26/2021 0457   BASOSABS 0.0 02/16/2021 0910      Latest Ref Rng & Units 06/27/2021   12:17 AM 06/26/2021    5:09 AM 06/26/2021    5:01 AM  CMP  Glucose 70 - 99 mg/dL 139   80   BUN 8 - 23 mg/dL 30   37   Creatinine 0.44 - 1.00 mg/dL 1.25   1.00   Sodium 135 - 145 mmol/L 137  136  137   Potassium 3.5 - 5.1 mmol/L 3.7  4.2  4.1   Chloride 98 - 111 mmol/L 101   104   CO2 22 - 32 mmol/L 24     Calcium 8.9 - 10.3 mg/dL 9.7        Radiology Studies: DG Chest Port 1 View  Result Date: 06/26/2021 CLINICAL DATA:  Shortness of breath. EXAM: PORTABLE CHEST 1 VIEW COMPARISON:  06/25/2021 FINDINGS: Stable cardiomediastinal contours. Aortic atherosclerosis. Interval progression of interstitial edema pattern with a lower lung zone predominance. No pleural effusion identified. There are a few patchy opacities within the right upper lobe which appear new from previous exam. The visualized osseous  structures are intact. Previous ORIF of the proximal right humerus. IMPRESSION: 1. Interval progression of interstitial edema pattern. 2. New patchy opacities within the right upper lobe, this may represent an area of pneumonia or asymmetric edema. Electronically Signed   By: Kerby Moors M.D.   On: 06/26/2021 05:55     Scheduled Meds:  albuterol  2.5 mg Nebulization QID   arformoterol  15 mcg Nebulization BID   ARIPiprazole  10 mg Oral Daily   aspirin EC  81 mg Oral q morning   atorvastatin  20 mg Oral Daily   bisoprolol  2.5 mg Oral Daily   budesonide  0.5 mg Nebulization BID   clopidogrel  75 mg Oral Daily   enoxaparin (LOVENOX) injection  40 mg Subcutaneous Q24H   furosemide  20 mg Oral Daily   lamoTRIgine  200 mg Oral BID   levofloxacin  500 mg Oral Daily  mouth rinse  15 mL Mouth Rinse BID   montelukast  10 mg Oral QHS   pantoprazole  40 mg Oral Daily   predniSONE  60 mg Oral Q breakfast   revefenacin  175 mcg Nebulization Daily   roflumilast  500 mcg Oral Daily   sertraline  150 mg Oral Daily   sodium chloride flush  3 mL Intravenous Q12H   Continuous Infusions:     LOS: 1 day   Time spent: [place], MD [facility] To contact the attending provider between 7A-7P or the covering provider during after hours 7P-7A, please log into the web site [email or web address] and access using universal [place] password for that web site. If you do not have the password, please call the hospital operator.  06/27/2021, 7:16 AM

## 2021-06-27 NOTE — Progress Notes (Signed)
[place] [address]  [facility]) Hospital Liaison Note   Received request from PMT provider/Amber S., NP, for hospice services at home after discharge. Chart and patient information under review by [facility] physician. Hospice eligibility approved.   Spoke with patient & friend/ [name] to initiate education related to hospice philosophy, services, and team approach to care. Both verbalized understanding of information given. Per discussion, the plan is for patient to discharge home via private vehicle once cleared to DC.    DME needs discussed. Patient has the following equipment in the home (Purchased privately): O2 Nebulizer [place]renshaw chair Patient denied any DME needs.  Address verified and is correct in the chart. [name] is the family member to contact for transport once patient is medically cleared.   Please send signed and completed DNR home with patient/family. Please provide prescriptions at discharge as needed to ensure ongoing symptom management.    [facility] information and contact numbers given to family & above information shared with TOC.   Please call with any questions/concerns.    Thank you for the opportunity to participate in this patient's care.   Daphene Calamity, MSW [facility] Liaison  [phone]

## 2021-06-27 NOTE — Progress Notes (Signed)
Daily Progress Note   Patient Name: DELIGHT BICKLE       Date: 06/27/2021 DOB: [birth year]  Age: 76 y.o. MRN#: [id number] Attending Physician: Nita Sells, MD Primary Care Physician: Harlan Stains, MD Admit Date: 06/26/2021  Reason for Consultation/Follow-up: Establishing goals of care, "CHF and COPD exacerbations with possible pneumonia."  Subjective: Chart review performed.  Received updates from primary RN -no acute concerns.  Patient remains on 2 L O2 nasal cannula and tolerating well.  Per notes plan is for discharge home likely tomorrow.  Went to visit patient at bedside -no family/visitors present.  Patient was lying in bed awake, alert, oriented, and able to participate in conversation.  No signs or non-verbal gestures of pain or discomfort noted. No respiratory distress, increased work of breathing, or secretions noted.  She denies shortness of breath.  Feels she is tolerating 2 L O2 nasal cannula well and confirms that this is her baseline oxygen use at home.  She confirms goal is for discharge home with hospice.  Discussed updating MOST form to reflect most current goals of comfort care once discharged and not wanting rehospitalization -she completed previous form with [facility] outpatient palliative care and would like to update form with them on her arrival home.  She confirms DNR/DNI.  All questions and concerns addressed. Encouraged to call with questions and/or concerns. PMT card previously provided.  Discussed disposition plan of home hospice with TOC and [facility] liaison.  Notified [facility] liaison that patient request to update MOST form when she gets home with one of their providers.  Length of Stay: 1  Current Medications: Scheduled Meds:   albuterol  2.5  mg Nebulization QID   arformoterol  15 mcg Nebulization BID   ARIPiprazole  10 mg Oral Daily   aspirin EC  81 mg Oral q morning   bisoprolol  2.5 mg Oral Daily   budesonide  0.5 mg Nebulization BID   clopidogrel  75 mg Oral Daily   enoxaparin (LOVENOX) injection  40 mg Subcutaneous Q24H   furosemide  20 mg Oral Daily   lamoTRIgine  200 mg Oral BID   levofloxacin  500 mg Oral Daily   mouth rinse  15 mL Mouth Rinse BID   montelukast  10 mg Oral QHS   pantoprazole  40 mg Oral Daily   predniSONE  60 mg Oral Q breakfast   revefenacin  175 mcg Nebulization Daily   roflumilast  500 mcg Oral Daily   sertraline  150 mg Oral Daily   sodium chloride flush  3 mL Intravenous Q12H    Continuous Infusions:   PRN Meds: acetaminophen **OR** acetaminophen, albuterol, artificial tears, docusate sodium, oxyCODONE, tiZANidine  Physical Exam Vitals and nursing note reviewed.  Constitutional:      General: She is not in acute distress. Pulmonary:     Effort: No respiratory distress.  Skin:    General: Skin is warm and dry.  Neurological:     Mental Status: She is alert and oriented to person, place, and time.     Motor: Weakness present.  Psychiatric:        Attention and Perception: Attention normal.        Behavior: Behavior is cooperative.        Cognition and Memory: Cognition and memory normal.             Vital Signs: BP (!) 148/69 (BP Location: Left Arm)   Pulse 71   Temp 98.2 F (36.8 C) (Oral)   Resp 17   Wt 88.7 kg   SpO2 93%   BMI 34.64 kg/m  SpO2: SpO2: 93 % O2 Device: O2 Device: Nasal Cannula O2 Flow Rate: O2 Flow Rate (L/min): (S) 2 L/min (turned down by MD)  Intake/output summary:  Intake/Output Summary (Last 24 hours) at 06/27/2021 1028 Last data filed at 06/27/2021 0700 Gross per 24 hour  Intake 720 ml  Output 2600 ml  Net -1880 ml   LBM: Last BM Date : 06/26/21 Baseline Weight: Weight: 88.7 kg Most recent weight: Weight: 88.7 kg       Palliative  Assessment/Data: PPS 50%      Patient Active Problem List   Diagnosis Date Noted   Transient hypotension 06/26/2021   Heart failure with reduced ejection fraction ([place]) 06/26/2021   Leukocytosis 06/26/2021   CKD (chronic kidney disease), stage III ([place]) 06/26/2021   Disorder of arteries and arterioles, unspecified ([place]) 05/25/2021   Essential tremor 05/25/2021   Hardening of the aorta (main artery of the heart) ([place]) 05/25/2021   History of renal cell carcinoma 05/25/2021   Iron deficiency anemia 05/25/2021   Prediabetes 05/25/2021   Simple chronic bronchitis ([place]) 05/25/2021   Tobacco dependence in remission 05/25/2021   Palliative care encounter 05/22/2021   Esophageal dysmotility 04/01/2021   Shortness of breath 03/11/2021   CAP (community acquired pneumonia) 03/01/2021   Chronic respiratory failure with hypoxia ([place]) 02/19/2021   Lung nodule 02/19/2021   Elevated troponin    Cervical dystonia 11/30/2020   Myofascial pain 11/30/2020   Abnormality of gait 11/30/2020   Chronic pain due to trauma 10/14/2020   Pressure injury of skin 09/12/2020   Neurogenic bladder 09/09/2020   Central cord syndrome ([place]) 09/02/2020   Surgery, elective    Chronic diastolic congestive heart failure (HCC)    Postoperative pain    Cervical myelopathy ([place]) 08/28/2020   Spinal stenosis in cervical region 08/28/2020   Acute on chronic respiratory failure with hypoxia (HCC) 05/28/2020   Chronic combined systolic and diastolic CHF (congestive heart failure) ([place]) 05/28/2020   GERD (gastroesophageal reflux disease) 07/02/2019   Pain in left knee 03/21/2019   Chronic diastolic CHF (congestive heart failure) ([place]) 03/07/2019   Atherosclerosis of native artery of both lower extremities with intermittent claudication ([place]) 03/30/2017   Chest pain 02/24/2017   Unspecified gastritis and gastroduodenitis  without mention of hemorrhage 03/05/2012   Hypoxia 03/04/2012   Former smoker 03/04/2012   COPD  with acute exacerbation ([place]) 03/04/2012   Abnormal abdominal CT scan 03/02/2012   Hypertension    Anxiety and depression    Alcoholism ([place])    Bipolar disorder ([place])    Status post carotid endarterectomy    Arthritis    Idiopathic acute facial nerve palsy    Blepharospasm    Anxiety    Vaginal cancer ([place])    Dry eye syndrome 11/28/2011   Hemifacial spasm 11/28/2011   Myogenic ptosis 11/28/2011   Carotid artery disease ([place]) 06/28/2011   Osteoporosis 05/30/2011   Hyperlipidemia 03/23/2011   Coronary artery disease    NSTEMI (non-ST elevated myocardial infarction) ([place])    Peripheral vascular disease (HCC)    Dysplasia of vagina, histologically confirmed 12/14/2010   Trigeminal neuralgia 12/07/2010   Rosacea 10/08/2010   CAD S/P percutaneous coronary angioplasty 04/12/2010   Osteoarthritis, multiple sites 02/11/2010    Palliative Care Assessment & Plan   Patient Profile: 76 y.o. female  with past medical history of COPD/emphysema on 2 L of oxygen, CAD s/p PCI, systolic CHF last EF 67-89%, and RCC s/p nephrectomy presented to ED on 06/26/2021 from home with complaints of shortness of breath.  With EMS and arrival to ED patient was found to be hypotensive.  She required CPAP with EMS and was subsequently placed on nonrebreather in ER but needed additional support and was put on BiPAP. Patient was admitted admitted on 06/26/2021 with acute on chronic respiratory failure with hypoxia secondary to heart failure with reduced ejection fraction, severe COPD with exacerbation, possible community-acquired pneumonia, leukocytosis, hypotension, CAD (previously deemed not a CABG candidate and managing medically).    Of note, patient is followed by [facility]'s outpatient palliative care.  Assessment: Principal Problem:   Acute on chronic respiratory failure with hypoxia (HCC) Active Problems:   Coronary artery disease   COPD with acute exacerbation (HCC)   GERD (gastroesophageal reflux  disease)   Chronic pain due to trauma   CAP (community acquired pneumonia)   History of renal cell carcinoma   Hyperlipidemia   Transient hypotension   Heart failure with reduced ejection fraction (HCC)   Leukocytosis   CKD (chronic kidney disease), stage III ([place])   Concern about end of life  Recommendations/Plan: Treat the treatable with goal to feel as best she can prior to discharge Continue DNR/DNI - durable DNR form completed and placed in shadow chart. Copy was made and will be scanned into [employer]/ACP tab Goal is for discharge home with hospice -TOC and [place] liaison aware.  TOC consult previously placed MOST form to be updated once patient returns home with [facility] provider per her request - updated goals include comfort measures at discharge without rehospitalization PMT will continue to follow peripherally. If there are any imminent needs please call the service directly  Goals of Care and Additional Recommendations: Limitations on Scope of Treatment: Avoid Hospitalization, No Artificial Feeding, and No Tracheostomy  Code Status:    Code Status Orders  (From admission, onward)           Start     Ordered   06/26/21 0957  Do not attempt resuscitation (DNR)  Continuous       Question Answer Comment  In the event of cardiac or respiratory ARREST Do not call a "code blue"   In the event of cardiac or respiratory ARREST Do not perform Intubation, CPR, defibrillation or ACLS  In the event of cardiac or respiratory ARREST Use medication by any route, position, wound care, and other measures to relive pain and suffering. May use oxygen, suction and manual treatment of airway obstruction as needed for comfort.      06/26/21 0956           Code Status History     Date Active Date Inactive Code Status Order ID Comments User Context   06/26/2021 0717 06/26/2021 0956 DNR [id number]  Norval Morton, MD ED   02/06/2021 1912 02/09/2021 1946 Full Code [id number]  Neena Rhymes, MD  ED   09/02/2020 1412 09/18/2020 1558 Full Code [id number]  Cathlyn Parsons, PA-C Inpatient   08/28/2020 2042 09/02/2020 1359 Full Code [id number]  Dawley, Theodoro Doing, DO Inpatient   05/28/2020 2229 05/31/2020 1638 DNR [id number]  Lenore Cordia, MD ED   02/24/2017 2343 02/26/2017 1920 DNR [id number]  Gwynne Edinger, MD ED   03/03/2012 1659 03/06/2012 1621 Full Code [id number]  Bonnielee Haff, MD Inpatient       Prognosis:  < 6 months  Discharge Planning: Home with Hospice  Care plan was discussed with primary RN, patient, TOC, [place] liaison  Thank you for allowing the Palliative Medicine Team to assist in the care of this patient.   Total Time 40 minutes Prolonged Time Billed  no       Greater than 50%  of this time was spent counseling and coordinating care related to the above assessment and plan.  Lin Landsman, NP  Please contact Palliative Medicine Team phone at [phone] for questions and concerns.   *Portions of this note are a verbal dictation therefore any spelling and/or grammatical errors are due to the "[employer]" system interpretation.

## 2021-06-27 NOTE — Progress Notes (Signed)
Pt BP 80/49.  Repeat manual BP 82/48.  TRH MD notified.  Patient asymptomatic and all other VSS.  TRH MD deferred to cardiology who was notified. Patient BP improved without intervention.

## 2021-06-27 NOTE — Evaluation (Signed)
Physical Therapy Evaluation Patient Details Name: [name] MRN: [id number] DOB: Nov 16, 1945 Today's Date: 06/27/2021  History of Present Illness  Pt is a 76 y.o. female who presented 06/26/21 with SOB. Admitted with acute hypoxic respiratory failure secondary to aspiration pneumonia and acute exacerbation of underlying severe emphysema/COPD. PMH: COPD/emphysema on 2 L of oxygen, CAD s/p PCI, systolic CHF last EF 89-38%, and RCC s/p nephrectomy   Clinical Impression  Pt presents with condition above and deficits mentioned below, see PT Problem List. PTA, she was mod I for mobility with a rollator, needing some assistance to manage her AD on stairs. Pt lives with her friend, who can assist her as needed 24/7 per pt, in a 1-level house with 1 STE. Currently, pt is demonstrating deficits in activity tolerance, balance, and strength that place her at risk for falls. Pt required minA for bed mobility, transfers, and gait using a RW today. Pt also with x2 LOB bouts needing minA to recover when ambulating. Educated pt to have her friend use their gait belt and hold onto pt with all standing mobility as she is at risk for falls. Pt verbalized understanding. As pt is functioning below her baseline and pt desires to remain fairly independent, recommending HHPT. Plan is for home with hospice. Will continue to follow acutely.     Recommendations for follow up therapy are one component of a multi-disciplinary discharge planning process, led by the attending physician.  Recommendations may be updated based on patient status, additional functional criteria and insurance authorization.  Follow Up Recommendations Home health PT (plan is for home with hospice)    Assistance Recommended at Discharge Frequent or constant Supervision/Assistance  Patient can return home with the following  A little help with walking and/or transfers;A little help with bathing/dressing/bathroom;Assistance with  cooking/housework;Assist for transportation;Help with stairs or ramp for entrance    Equipment Recommendations None recommended by PT  Recommendations for Other Services  OT consult    Functional Status Assessment Patient has had a recent decline in their functional status and demonstrates the ability to make significant improvements in function in a reasonable and predictable amount of time.     Precautions / Restrictions Precautions Precautions: Fall;Other (comment) Precaution Comments: watch SpO2 Restrictions Weight Bearing Restrictions: No      Mobility  Bed Mobility Overal bed mobility: Needs Assistance Bed Mobility: Supine to Sit, Sit to Supine     Supine to sit: Min assist, HOB elevated Sit to supine: Min assist, HOB elevated   General bed mobility comments: MinA to manage trunk and legs supine <> sit EOB, HOB elevated    Transfers Overall transfer level: Needs assistance Equipment used: Rolling walker (2 wheels) Transfers: Sit to/from Stand Sit to Stand: Min assist           General transfer comment: MinA to power up to stand from EOB    Ambulation/Gait Ambulation/Gait assistance: Min assist Gait Distance (Feet): 75 Feet Assistive device: Rolling walker (2 wheels) Gait Pattern/deviations: Step-through pattern, Decreased stride length, Trunk flexed Gait velocity: reduced Gait velocity interpretation: <1.31 ft/sec, indicative of household ambulator   General Gait Details: Pt with slow, unsteady gait using RW, maintaining her kyphotic and froward head posture. x2 LOB needing minA to recover, minA for stability majority of the time also  Stairs            Wheelchair Mobility    Modified Rankin (Stroke Patients Only)       Balance Overall balance assessment: Needs assistance  Sitting-balance support: No upper extremity supported, Feet supported Sitting balance-Leahy Scale: Fair     Standing balance support: Bilateral upper extremity  supported, During functional activity, Reliant on assistive device for balance Standing balance-Leahy Scale: Poor Standing balance comment: Reliant on RW and external assist                             Pertinent Vitals/Pain Pain Assessment Pain Assessment: Faces Faces Pain Scale: No hurt Pain Intervention(s): Monitored during session    Home Living Family/patient expects to be discharged to:: Private residence Living Arrangements: Non-relatives/Friends Available Help at Discharge: Friend(s);Available 24 hours/day Type of Home: House Home Access: Stairs to enter Entrance Stairs-Rails: None Entrance Stairs-Number of Steps: 1   Home Layout: One level Home Equipment: Rollator (4 wheels);BSC/3in1;Tub bench;Grab bars - tub/shower Additional Comments: sleeps in a recliner; on 2L O2 all times    Prior Function Prior Level of Function : Needs assist             Mobility Comments: Mod I using rollator for mobility. Light assistance to manage AD on stairs ADLs Comments: Assistance needed to manage shirt and ADLs involving shoulder flexion due to UE AROM limitations due to neck deficits     Hand Dominance   Dominant Hand: Left    Extremity/Trunk Assessment   Upper Extremity Assessment Upper Extremity Assessment: Defer to OT evaluation    Lower Extremity Assessment Lower Extremity Assessment: Generalized weakness    Cervical / Trunk Assessment Cervical / Trunk Assessment: Kyphotic (forward head)  Communication   Communication: No difficulties  Cognition Arousal/Alertness: Awake/alert Behavior During Therapy: WFL for tasks assessed/performed Overall Cognitive Status: Within Functional Limits for tasks assessed                                          General Comments General comments (skin integrity, edema, etc.): educated pt to have her friend use their gait belt and hold onto pt with all standing mobility as she is at risk for falls. Pt  verbalized understanding.    Exercises     Assessment/Plan    PT Assessment Patient needs continued PT services  PT Problem List Decreased strength;Decreased activity tolerance;Decreased mobility;Decreased balance;Cardiopulmonary status limiting activity;Decreased range of motion       PT Treatment Interventions DME instruction;Gait training;Stair training;Functional mobility training;Therapeutic activities;Therapeutic exercise;Balance training;Neuromuscular re-education;Patient/family education    PT Goals (Current goals can be found in the Care Plan section)  Acute Rehab PT Goals Patient Stated Goal: to get stronger and remain fairly independent PT Goal Formulation: With patient Time For Goal Achievement: 07/11/21 Potential to Achieve Goals: Fair    Frequency Min 3X/week     Co-evaluation               AM-PAC PT "6 Clicks" Mobility  Outcome Measure Help needed turning from your back to your side while in a flat bed without using bedrails?: A Little Help needed moving from lying on your back to sitting on the side of a flat bed without using bedrails?: A Little Help needed moving to and from a bed to a chair (including a wheelchair)?: A Little Help needed standing up from a chair using your arms (e.g., wheelchair or bedside chair)?: A Little Help needed to walk in hospital room?: A Little Help needed climbing 3-5 steps with a railing? :  A Lot 6 Click Score: 17    End of Session Equipment Utilized During Treatment: Gait belt;Oxygen Activity Tolerance: Patient tolerated treatment well Patient left: in bed;with call bell/phone within reach;with bed alarm set   PT Visit Diagnosis: Unsteadiness on feet (R26.81);Other abnormalities of gait and mobility (R26.89);Muscle weakness (generalized) (M62.81);Difficulty in walking, not elsewhere classified (R26.2)    Time: 1898-4210 PT Time Calculation (min) (ACUTE ONLY): 22 min   Charges:   PT Evaluation $PT Eval Moderate  Complexity: 1 Mod          Moishe Spice, PT, DPT Acute Rehabilitation Services  Office: [phone]   Orvan Falconer 06/27/2021, 6:02 PM

## 2021-06-28 DIAGNOSIS — J9621 Acute and chronic respiratory failure with hypoxia: Secondary | ICD-10-CM | POA: Diagnosis not present

## 2021-06-28 DIAGNOSIS — I5020 Unspecified systolic (congestive) heart failure: Secondary | ICD-10-CM | POA: Diagnosis not present

## 2021-06-28 LAB — CBC WITH DIFFERENTIAL/PLATELET
Abs Immature Granulocytes: 0.03 10*3/uL (ref 0.00–0.07)
Basophils Absolute: 0 10*3/uL (ref 0.0–0.1)
Basophils Relative: 0 %
Eosinophils Absolute: 0 10*3/uL (ref 0.0–0.5)
Eosinophils Relative: 0 %
HCT: 35.6 % — ABNORMAL LOW (ref 36.0–46.0)
Hemoglobin: 12.2 g/dL (ref 12.0–15.0)
Immature Granulocytes: 0 %
Lymphocytes Relative: 9 %
Lymphs Abs: 0.8 10*3/uL (ref 0.7–4.0)
MCH: 33 pg (ref 26.0–34.0)
MCHC: 34.3 g/dL (ref 30.0–36.0)
MCV: 96.2 fL (ref 80.0–100.0)
Monocytes Absolute: 0.4 10*3/uL (ref 0.1–1.0)
Monocytes Relative: 4 %
Neutro Abs: 7.5 10*3/uL (ref 1.7–7.7)
Neutrophils Relative %: 87 %
Platelets: 204 10*3/uL (ref 150–400)
RBC: 3.7 MIL/uL — ABNORMAL LOW (ref 3.87–5.11)
RDW: 14 % (ref 11.5–15.5)
WBC: 8.7 10*3/uL (ref 4.0–10.5)
nRBC: 0 % (ref 0.0–0.2)

## 2021-06-28 LAB — BASIC METABOLIC PANEL
Anion gap: 6 (ref 5–15)
BUN: 23 mg/dL (ref 8–23)
CO2: 25 mmol/L (ref 22–32)
Calcium: 9.2 mg/dL (ref 8.9–10.3)
Chloride: 103 mmol/L (ref 98–111)
Creatinine, Ser: 1.07 mg/dL — ABNORMAL HIGH (ref 0.44–1.00)
GFR, Estimated: 54 mL/min — ABNORMAL LOW (ref >60)
Glucose, Bld: 138 mg/dL — ABNORMAL HIGH (ref 70–99)
Potassium: 3.6 mmol/L (ref 3.5–5.1)
Sodium: 134 mmol/L — ABNORMAL LOW (ref 135–145)

## 2021-06-28 MED ORDER — LEVOFLOXACIN 500 MG PO TABS: 500.0000 mg | ORAL_TABLET | Freq: Every day | ORAL | 0 refills | Status: DC

## 2021-06-28 MED ORDER — FUROSEMIDE 40 MG PO TABS
40.0000 mg | ORAL_TABLET | Freq: Every day | ORAL | Status: DC
  Administered 2021-06-28: 40 mg via ORAL
  Filled 2021-06-28: qty 1

## 2021-06-28 MED ORDER — PREDNISONE 20 MG PO TABS: 60.0000 mg | ORAL_TABLET | Freq: Every day | ORAL | 0 refills | Status: AC

## 2021-06-28 MED ORDER — FUROSEMIDE 40 MG PO TABS: 40.0000 mg | ORAL_TABLET | Freq: Every day | ORAL | 0 refills | Status: DC

## 2021-06-28 NOTE — Progress Notes (Signed)
Treated outpatient with doxycycline. Clinically improving with slow to resolve AECOPD at [facility] on 6/9. CXR was read 6/11. Pt was hospitalized on 6/10 for concern for acute on chronic CHF exacerbation and possible pulmonary edema vs CAP.

## 2021-06-28 NOTE — Plan of Care (Signed)

## 2021-06-28 NOTE — Discharge Summary (Signed)
Physician Discharge Summary  [name] NAT:[id number] DOB: [birth year] DOA: 06/26/2021  PCP: Harlan Stains, MD  Admit date: 06/26/2021 Discharge date: 06/28/2021  Time spent: 46 minutes  Recommendations for Outpatient Follow-up:  New medications Levaquin, prednisone and increased the dose of Lasix this hospitalization See up-to-date MAR below on discharge for meds that have been discontinued not compatible with hospice philosophy Hospice to come and provide support-patient is DNR which was confirmed  Discharge Diagnoses:  MAIN problem for hospitalization   Acute hypoxic respiratory failure secondary to aspiration pneumonia shortness of breath superimposed upon severe emphysema  Please see below for itemized issues addressed in HOpsital- refer to other progress notes for clarity if needed  Discharge Condition: Guarded  Diet recommendation: Heart healthy  Filed Weights   06/27/21 0348 06/28/21 0501  Weight: 88.7 kg 88.4 kg    History of present illness:  76 year old white female Severe COPD/emphysema CAD + inferior MI 2023 three-vessel disease, carotid stenosis + R RCA in 1995 PAD HFrEF -40 to 42% normal diastolic function  HTN HLD bipolar Renal cancer status post partial L nephrectomy 2014 vaginal cancer    Developed worsening SOB sound, DOE progressing over 1 week + productive cough - chills, fever,  6/2 with increased work of breathing--Rx prednisone for 5 days doxycycline-with tapering down, her respiratory status worsened 6/9 seen at pulmonology office given a taper once again of prednisone--advised to change the way that she takes her inhalers   Brought in by EMS for shortness of breath blood pressures 60s over 20s Initially was on BiPAP transitioned to nonrebreather WBC 17.4 hemoglobin 15 BNP 186 CXR interval progression of interstitial edema with new patchy infiltrates RUL  Rx Solu-Medrol DuoNeb Lasix Rocephin azithromycin Palliative care saw the patient and  recommended DNR/DNI as patient wished to not be intubated Cardiology also saw the patient and made recommendations  Hospital Course:  Acute hypoxic respiratory failure secondary to aspiration pneumonia  On steroids would explain elevated WBC--IV antibiotics narrowed to Levaquin complete in the outpatient setting Upper lobe findings suggestive of aspiration but responding well to Levaquin DNR/DNI on discharge and stable for hospice to assume care at home and keep her out of hospital as she does not want to be hospitalized Acute exacerbation of underlying severe emphysema/COPD Continue prednisone 60 daily and given a steroid burst of the same Continue albuterol 2.5 nebs 4 times daily, Brovana 15 twice daily, Pulmicort 0.5 twice daily, Singulair 10 at bedtime, Yupelri 175 daily, Daliresp 500 daily She has end-stage disease Severe CAD three-vessel disease nonoperable HFrEF 40-45% HTN adjusted by cardiology 2/2 hypotension Stopped amlodipine 5, Caduet, Entresto as well as Jardiance Continues on: Bisoprolol but at lower dose 2.5 daily,  Lasix adjusted to 40 daily  continue Plavix 75 daily, Aspirin 81 I/O's inaccurate would not add back Entresto and I am discontinuing her Lipitor--the goal of her management is comfort-not longevity Depression Continue Abilify 10 daily, sertraline 150 daily Can continue Lamictal 200 twice daily Chronic pains Continue cautiously tizanidine 4 every 6 as needed Continue Oxy IR 10 every 4 as needed-discontinued fentanyl injections Underlying vaginal as well as renal cancer See above discussion   Discharge Exam: Vitals:   06/28/21 0811 06/28/21 0833  BP: 119/60   Pulse: (!) 53 (!) 55  Resp: (!) 22 20  Temp: (!) 97.5 F (36.4 C)   SpO2: 92% 96%    Subj on day of d/c   Awake coherent no distress had a good breakfast no chest pain has questions  about her prednisone and fluid pills No fever In good spirits  General Exam on discharge  EOMI NCAT no  focal deficit no wheeze but some rhonchi bilaterally Neck is soft supple S1-S2 no murmur slight tachycardia noted on monitors ROM is intact Neurologically moving all 4 limbs without deficit Lower extremity soft supple nonswollen  Discharge Instructions   Discharge Instructions     Diet - low sodium heart healthy   Complete by: As directed    Discharge instructions   Complete by: As directed    Look at your medications carefully as several have changed with dosing changes You will need to be on steroids at relatively higher dose for several days we have given you an antibiotic Levaquin for pneumonia and we have increased your Lasix We have discontinued several medications not compatible with end-of-life such as your cholesterol, diabetic meds, multiple vitamins etc. Would recommend that you follow-up in the outpatient setting with hospice-we will resume your oxygen Hospice will assess and manage your end-of-life needs and help with the transition   Increase activity slowly   Complete by: As directed       Allergies as of 06/28/2021       Reactions   Cephalexin Diarrhea   Other reaction(s): diarrhea   Doxycycline Diarrhea   Gabapentin Other (See Comments)   sleepiness   Morphine And Related Anxiety   Was confused when taking it        Medication List     STOP taking these medications    atorvastatin 20 MG tablet Commonly known as: LIPITOR   Caduet 10-20 MG tablet Generic drug: amlodipine-atorvastatin   Calcium Carbonate-Vitamin D3 600-400 MG-UNIT Tabs   docusate sodium 100 MG capsule Commonly known as: COLACE   empagliflozin 10 MG Tabs tablet Commonly known as: JARDIANCE   Entresto 49-51 MG Generic drug: sacubitril-valsartan   FeroSul 325 (65 FE) MG tablet Generic drug: ferrous sulfate   Fish Oil 1000 MG Caps   hydrALAZINE 25 MG tablet Commonly known as: APRESOLINE   multivitamin capsule   potassium chloride 8 MEQ tablet Commonly known as:  KLOR-CON   Prolia 60 MG/ML Sosy injection Generic drug: denosumab   vitamin C 1000 MG tablet   Vitamin D 50 MCG (2000 UT) tablet       TAKE these medications    acetaminophen 325 MG tablet Commonly known as: TYLENOL Take 2 tablets (650 mg total) by mouth every 4 (four) hours as needed for mild pain ((score 1 to 3) or temp > 100.5).   albuterol 108 (90 Base) MCG/ACT inhaler Commonly known as: VENTOLIN HFA Inhale 2 puffs into the lungs every 6 (six) hours as needed for wheezing. What changed: Another medication with the same name was changed. Make sure you understand how and when to take each.   albuterol (2.5 MG/3ML) 0.083% nebulizer solution Commonly known as: PROVENTIL INAHLE 3 ML'S ( 1 VIAL) VIA NEBULIZER EVERY 6 HOURS AS NEEDED FOR WHEEZING OR FOR SHORTNESS OF BREATH What changed: See the new instructions.   amLODipine 5 MG tablet Commonly known as: NORVASC TAKE ONE TABLET BY MOUTH DAILY   arformoterol 15 MCG/2ML Nebu Commonly known as: BROVANA Take 2 mLs (15 mcg total) by nebulization 2 (two) times daily.   ARIPiprazole 10 MG tablet Commonly known as: ABILIFY Take 1 tablet (10 mg total) by mouth daily.   aspirin EC 81 MG tablet Take 81 mg by mouth every morning.   bisoprolol 5 MG tablet Commonly known as: ZEBETA  TAKE 1/2 TABLET BY MOUTH DAILY   budesonide 0.5 MG/2ML nebulizer solution Commonly known as: Pulmicort Take 2 mLs (0.5 mg total) by nebulization in the morning and at bedtime.   celecoxib 200 MG capsule Commonly known as: CELEBREX Take 400 mg by mouth daily.   clopidogrel 75 MG tablet Commonly known as: PLAVIX Take 1 tablet (75 mg total) by mouth daily.   dexlansoprazole 60 MG capsule Commonly known as: DEXILANT Take 1 capsule (60 mg total) by mouth daily.   furosemide 40 MG tablet Commonly known as: LASIX Take 1 tablet (40 mg total) by mouth daily. Start taking on: June 29, 2021 What changed:  medication strength how much to take    lamoTRIgine 100 MG tablet Commonly known as: LAMICTAL Take 2 tablets (200 mg total) by mouth 2 (two) times daily.   levofloxacin 500 MG tablet Commonly known as: LEVAQUIN Take 1 tablet (500 mg total) by mouth daily. Start taking on: June 29, 2021   montelukast 10 MG tablet Commonly known as: SINGULAIR Take 1 tablet (10 mg total) by mouth at bedtime. TAKE 1 TABLET BY MOUTH EVERYDAY AT BEDTIME What changed: additional instructions   nitroGLYCERIN 0.4 MG SL tablet Commonly known as: NITROSTAT Place 1 tablet (0.4 mg total) under the tongue every 5 (five) minutes as needed for chest pain.   Oxycodone HCl 10 MG Tabs Take 1 tablet (10 mg total) by mouth every 4 (four) hours as needed for severe pain.   OXYGEN Inhale 2 L into the lungs continuous.   predniSONE 20 MG tablet Commonly known as: DELTASONE Take 3 tablets (60 mg total) by mouth daily with breakfast for 5 days. Start taking on: June 29, 2021 What changed:  medication strength how much to take Another medication with the same name was removed. Continue taking this medication, and follow the directions you see here.   revefenacin 175 MCG/3ML nebulizer solution Commonly known as: YUPELRI Take 3 mLs (175 mcg total) by nebulization daily.   Daliresp 500 MCG Tabs tablet Generic drug: roflumilast Take 500 mcg by mouth daily.   roflumilast 500 MCG Tabs tablet Commonly known as: DALIRESP Take 0.5 tablets (250 mcg total) by mouth daily for 28 days, THEN 1 tablet (500 mcg total) daily. Start taking on: April 07, 2021   sertraline 100 MG tablet Commonly known as: ZOLOFT Take 150 mg by mouth daily. Take 150 mg per patient.   Systane 0.4-0.3 % Gel ophthalmic gel Generic drug: Polyethyl Glycol-Propyl Glycol Place 1 application into both eyes daily as needed (dry eyes).   tiZANidine 4 MG tablet Commonly known as: ZANAFLEX Take 1 tablet (4 mg total) by mouth every 6 (six) hours.               Durable Medical  Equipment  (From admission, onward)           Start     Ordered   06/28/21 1012  DME Oxygen  Once       Question Answer Comment  Length of Need Lifetime   Mode or (Route) Nasal cannula   Liters per Minute 2   Oxygen delivery system Gas      06/28/21 1013           Allergies  Allergen Reactions   Cephalexin Diarrhea    Other reaction(s): diarrhea   Doxycycline Diarrhea   Gabapentin Other (See Comments)    sleepiness   Morphine And Related Anxiety    Was confused when taking it  The results of significant diagnostics from this hospitalization (including imaging, microbiology, ancillary and laboratory) are listed below for reference.    Significant Diagnostic Studies: ECHOCARDIOGRAM COMPLETE  Result Date: 06/27/2021    ECHOCARDIOGRAM REPORT   Patient Name:   EUFELIA VENO [facility] Date of Exam: 06/27/2021 Medical Rec #:  [id number]        Height:       63.0 in Accession #:    [id number]       Weight:       195.5 lb Date of Birth:  Feb 13, 1945        BSA:          1.915 m Patient Age:    76 years         BP:           155/90 mmHg Patient Gender: F                HR:           61 bpm. Exam Location:  Inpatient Procedure: 2D Echo, Cardiac Doppler and Color Doppler Indications:    CHF-acute systolic  History:        Patient has prior history of Echocardiogram examinations, most                 recent 02/07/2021. Previous Myocardial Infarction and CAD; Risk                 Factors:Hypertension. ETOH abuse.  Sonographer:    Clayton Lefort RDCS (AE) Referring Phys: [id number] RONDELL A SMITH IMPRESSIONS  1. Left ventricular ejection fraction, by estimation, is 55 to 60%. The left ventricle has normal function. The left ventricle demonstrates regional wall motion abnormalities (see scoring diagram/findings for description). There is moderate eccentric left ventricular hypertrophy. Left ventricular diastolic parameters were normal. There is hypokinesis of the left ventricular, basal inferior wall.  2.  Right ventricular systolic function is normal. The right ventricular size is normal. Tricuspid regurgitation signal is inadequate for assessing PA pressure.  3. Right atrial size was mildly dilated.  4. The mitral valve is abnormal. Trivial mitral valve regurgitation. No evidence of mitral stenosis. Moderate mitral annular calcification.  5. The aortic valve is tricuspid. There is mild calcification of the aortic valve. There is mild thickening of the aortic valve. Aortic valve regurgitation is not visualized. Aortic valve sclerosis/calcification is present, without any evidence of aortic stenosis.  6. The inferior vena cava is normal in size with greater than 50% respiratory variability, suggesting right atrial pressure of 3 mmHg. Comparison(s): Prior images reviewed side by side. Changes from prior study are noted. EF and septal wall motion normalized compared to prior study. Conclusion(s)/Recommendation(s): LV function improved compared to prior. FINDINGS  Left Ventricle: Left ventricular ejection fraction, by estimation, is 55 to 60%. The left ventricle has normal function. The left ventricle demonstrates regional wall motion abnormalities. The left ventricular internal cavity size was normal in size. There is moderate eccentric left ventricular hypertrophy. Left ventricular diastolic parameters were normal. Right Ventricle: The right ventricular size is normal. No increase in right ventricular wall thickness. Right ventricular systolic function is normal. Tricuspid regurgitation signal is inadequate for assessing PA pressure. Left Atrium: Left atrial size was normal in size. Right Atrium: Right atrial size was mildly dilated. Pericardium: There is no evidence of pericardial effusion. Mitral Valve: The mitral valve is abnormal. Moderate mitral annular calcification. Trivial mitral valve regurgitation. No evidence of mitral valve stenosis. Tricuspid Valve: The tricuspid valve is  normal in structure. Tricuspid  valve regurgitation is trivial. No evidence of tricuspid stenosis. Aortic Valve: The aortic valve is tricuspid. There is mild calcification of the aortic valve. There is mild thickening of the aortic valve. Aortic valve regurgitation is not visualized. Aortic valve sclerosis/calcification is present, without any evidence of aortic stenosis. Aortic valve mean gradient measures 4.0 mmHg. Aortic valve peak gradient measures 7.8 mmHg. Aortic valve area, by VTI measures 2.66 cm. Pulmonic Valve: The pulmonic valve was not well visualized. Pulmonic valve regurgitation is trivial. Aorta: The aortic root and ascending aorta are structurally normal, with no evidence of dilitation and the aortic arch was not well visualized. Venous: The inferior vena cava is normal in size with greater than 50% respiratory variability, suggesting right atrial pressure of 3 mmHg. IAS/Shunts: The atrial septum is grossly normal.  LEFT VENTRICLE PLAX 2D LVIDd:         4.50 cm   Diastology LVIDs:         3.20 cm   LV e' medial:    5.77 cm/s LV PW:         1.30 cm   LV E/e' medial:  10.4 LV IVS:        1.60 cm   LV e' lateral:   7.29 cm/s LVOT diam:     2.20 cm   LV E/e' lateral: 8.2 LV SV:         72 LV SV Index:   38 LVOT Area:     3.80 cm  RIGHT VENTRICLE             IVC RV Basal diam:  3.20 cm     IVC diam: 1.60 cm RV S prime:     16.20 cm/s TAPSE (M-mode): 1.9 cm LEFT ATRIUM             Index        RIGHT ATRIUM           Index LA diam:        2.40 cm 1.25 cm/m   RA Area:     20.30 cm LA Vol (A2C):   44.9 ml 23.44 ml/m  RA Volume:   58.50 ml  30.54 ml/m LA Vol (A4C):   48.9 ml 25.53 ml/m LA Biplane Vol: 49.8 ml 26.00 ml/m  AORTIC VALVE AV Area (Vmax):    2.51 cm AV Area (Vmean):   2.46 cm AV Area (VTI):     2.66 cm AV Vmax:           140.00 cm/s AV Vmean:          89.800 cm/s AV VTI:            0.270 m AV Peak Grad:      7.8 mmHg AV Mean Grad:      4.0 mmHg LVOT Vmax:         92.50 cm/s LVOT Vmean:        58.100 cm/s LVOT VTI:           0.189 m LVOT/AV VTI ratio: 0.70  AORTA Ao Root diam: 3.40 cm Ao Asc diam:  3.30 cm MITRAL VALVE MV Area (PHT): 2.43 cm    SHUNTS MV Decel Time: 312 msec    Systemic VTI:  0.19 m MV E velocity: 59.90 cm/s  Systemic Diam: 2.20 cm MV A velocity: 77.10 cm/s MV E/A ratio:  0.78 Buford Dresser MD Electronically signed by Buford Dresser MD Signature Date/Time: 06/27/2021/4:14:06 PM    Final  DG Chest 2 View  Result Date: 06/27/2021 CLINICAL DATA:  Shortness of breath. EXAM: CHEST - 2 VIEW COMPARISON:  Chest XR, 04/01/2021 and 03/11/2021. CT chest, 05/21/2021 and 03/11/2021 FINDINGS: Cardiac silhouette is within normal limits. Tortuous thoracic aorta with atherosclerotic calcifications. Lungs are hyperinflated with flattening of the diaphragm. Additional emphysematous lung change with RIGHT-greater-than LEFT apical lucencies. Relative increase in interstitial thickening with RIGHT mid lung streaky opacity. No pleural effusion or pneumothorax. RIGHT proximal humeral fixation. Chronic thoracic vertebral compression deformities. No acute osseous abnormality. IMPRESSION: 1. Emphysematous lung findings without mild superimposed interstitial thickening, suspicious for pulmonary edema. 2. RIGHT mid lung opacity may represent asymmetric edema, however early superimposed pneumonia can appear similar. 3. Aortic Atherosclerosis (ICD10-I70.0) and Emphysema (ICD10-J43.9). Electronically Signed   By: Michaelle Birks M.D.   On: 06/27/2021 13:48   DG Chest Port 1 View  Result Date: 06/26/2021 CLINICAL DATA:  Shortness of breath. EXAM: PORTABLE CHEST 1 VIEW COMPARISON:  06/25/2021 FINDINGS: Stable cardiomediastinal contours. Aortic atherosclerosis. Interval progression of interstitial edema pattern with a lower lung zone predominance. No pleural effusion identified. There are a few patchy opacities within the right upper lobe which appear new from previous exam. The visualized osseous structures are intact. Previous  ORIF of the proximal right humerus. IMPRESSION: 1. Interval progression of interstitial edema pattern. 2. New patchy opacities within the right upper lobe, this may represent an area of pneumonia or asymmetric edema. Electronically Signed   By: Kerby Moors M.D.   On: 06/26/2021 05:55    Microbiology: Recent Results (from the past 240 hour(s))  Resp Panel by RT-PCR (Flu A&B, Covid) Anterior Nasal Swab     Status: None   Collection Time: 06/26/21  7:02 AM   Specimen: Anterior Nasal Swab  Result Value Ref Range Status   SARS Coronavirus 2 by RT PCR NEGATIVE NEGATIVE Final    Comment: (NOTE) SARS-CoV-2 target nucleic acids are NOT DETECTED.  The SARS-CoV-2 RNA is generally detectable in upper respiratory specimens during the acute phase of infection. The lowest concentration of SARS-CoV-2 viral copies this assay can detect is 138 copies/mL. A negative result does not preclude SARS-Cov-2 infection and should not be used as the sole basis for treatment or other patient management decisions. A negative result may occur with  improper specimen collection/handling, submission of specimen other than nasopharyngeal swab, presence of viral mutation(s) within the areas targeted by this assay, and inadequate number of viral copies(<138 copies/mL). A negative result must be combined with clinical observations, patient history, and epidemiological information. The expected result is Negative.  Fact Sheet for Patients:  EntrepreneurPulse.com.au  Fact Sheet for Healthcare Providers:  IncredibleEmployment.be  This test is no t yet approved or cleared by the [place] FDA and  has been authorized for detection and/or diagnosis of SARS-CoV-2 by FDA under an Emergency Use Authorization (EUA). This EUA will remain  in effect (meaning this test can be used) for the duration of the COVID-19 declaration under Section 564(b)(1) of the Act, 21 U.S.C.section  360bbb-3(b)(1), unless the authorization is terminated  or revoked sooner.       Influenza A by PCR NEGATIVE NEGATIVE Final   Influenza B by PCR NEGATIVE NEGATIVE Final    Comment: (NOTE) The Xpert Xpress SARS-CoV-2/FLU/RSV plus assay is intended as an aid in the diagnosis of influenza from Nasopharyngeal swab specimens and should not be used as a sole basis for treatment. Nasal washings and aspirates are unacceptable for Xpert Xpress SARS-CoV-2/FLU/RSV testing.  Fact Sheet  for Patients: EntrepreneurPulse.com.au  Fact Sheet for Healthcare Providers: IncredibleEmployment.be  This test is not yet approved or cleared by the [place] FDA and has been authorized for detection and/or diagnosis of SARS-CoV-2 by FDA under an Emergency Use Authorization (EUA). This EUA will remain in effect (meaning this test can be used) for the duration of the COVID-19 declaration under Section 564(b)(1) of the Act, 21 U.S.C. section 360bbb-3(b)(1), unless the authorization is terminated or revoked.  Performed at [facility], [place] [address], [place], [place] [address]   MRSA Next Gen by PCR, Nasal     Status: None   Collection Time: 06/26/21  8:00 PM   Specimen: Nasal Mucosa; Nasal Swab  Result Value Ref Range Status   MRSA by PCR Next Gen NOT DETECTED NOT DETECTED Final    Comment: (NOTE) The GeneXpert MRSA Assay (FDA approved for NASAL specimens only), is one component of a comprehensive MRSA colonization surveillance program. It is not intended to diagnose MRSA infection nor to guide or monitor treatment for MRSA infections. Test performance is not FDA approved in patients less than 56 years old. Performed at [facility], [place] [address], [place], [place] [address]      Labs: Basic Metabolic Panel: Recent Labs  Lab 06/26/21 0457 06/26/21 0501 06/26/21 0509 06/27/21 0017 06/28/21 0150  NA 136  138 137 136 137 134*  K 4.1  4.0  4.1 4.2 3.7 3.6  CL 104  104 104  --  101 103  CO2 22  --   --  24 25  GLUCOSE 84  83 80  --  139* 138*  BUN 31*  35* 37*  --  30* 23  CREATININE 1.08*  1.00 1.00  --  1.25* 1.07*  CALCIUM 10.5*  --   --  9.7 9.2   Liver Function Tests: Recent Labs  Lab 06/26/21 0457  AST 23  ALT 27  ALKPHOS 99  BILITOT 0.6  PROT 6.0*  ALBUMIN 3.5   No results for input(s): "LIPASE", "AMYLASE" in the last 168 hours. No results for input(s): "AMMONIA" in the last 168 hours. CBC: Recent Labs  Lab 06/26/21 0457 06/26/21 0501 06/26/21 0509 06/27/21 0017 06/28/21 0150  WBC 17.4*  --   --  17.6* 8.7  NEUTROABS 15.2*  --   --   --  7.5  HGB 15.3*  16.0* 16.0* 16.0* 13.3 12.2  HCT 45.2  47.0* 47.0* 47.0* 36.9 35.6*  MCV 97.0  --   --  96.9 96.2  PLT 251  --   --  223 204   Cardiac Enzymes: No results for input(s): "CKTOTAL", "CKMB", "CKMBINDEX", "TROPONINI" in the last 168 hours. BNP: BNP (last 3 results) Recent Labs    02/06/21 0905 02/07/21 0416 06/26/21 0457  BNP 172.0* 1,642.5* 186.1*    ProBNP (last 3 results) No results for input(s): "PROBNP" in the last 8760 hours.  CBG: No results for input(s): "GLUCAP" in the last 168 hours.     Signed:  Nita Sells MD   [facility] 06/28/2021, 10:13 AM

## 2021-06-28 NOTE — Progress Notes (Signed)
Heart Failure Navigator Progress Note  Assessed for Heart & Vascular TOC clinic readiness.  Patient does not meet criteria due to Hospice at discharge. Earnestine Leys, BSN, RN Heart Failure Transport planner Only

## 2021-06-28 NOTE — Progress Notes (Addendum)
Progress Note  Patient Name: [name] Date of Encounter: 06/28/2021  [facility] Cardiologist: Peter Martinique, MD   Subjective   No CP; denies dyspnea  Inpatient Medications    Scheduled Meds:  arformoterol  15 mcg Nebulization BID   ARIPiprazole  10 mg Oral Daily   aspirin EC  81 mg Oral q morning   bisoprolol  2.5 mg Oral Daily   budesonide  0.5 mg Nebulization BID   clopidogrel  75 mg Oral Daily   enoxaparin (LOVENOX) injection  40 mg Subcutaneous Q24H   furosemide  20 mg Oral Daily   lamoTRIgine  200 mg Oral BID   levofloxacin  500 mg Oral Daily   mouth rinse  15 mL Mouth Rinse BID   montelukast  10 mg Oral QHS   pantoprazole  40 mg Oral Daily   predniSONE  60 mg Oral Q breakfast   revefenacin  175 mcg Nebulization Daily   roflumilast  500 mcg Oral Daily   sertraline  150 mg Oral Daily   sodium chloride flush  3 mL Intravenous Q12H   Continuous Infusions:   PRN Meds: acetaminophen **OR** acetaminophen, albuterol, albuterol, artificial tears, docusate sodium, oxyCODONE, tiZANidine   Vital Signs    Vitals:   06/28/21 0453 06/28/21 0455 06/28/21 0501 06/28/21 0811  BP: (!) 165/76 (!) 160/76  119/60  Pulse: 60   (!) 53  Resp: 20   (!) 22  Temp: (!) 97.5 F (36.4 C)   (!) 97.5 F (36.4 C)  TempSrc: Oral   Oral  SpO2: 93%   92%  Weight:   88.4 kg     Intake/Output Summary (Last 24 hours) at 06/28/2021 0830 Last data filed at 06/28/2021 0456 Gross per 24 hour  Intake 483 ml  Output 1150 ml  Net -667 ml       06/28/2021    5:01 AM 06/27/2021    3:48 AM 06/25/2021    3:52 PM  Last 3 Weights  Weight (lbs) 194 lb 14.2 oz 195 lb 8.8 oz 195 lb  Weight (kg) 88.4 kg 88.7 kg 88.451 kg      Telemetry    Sinus with occasional PVC- Personally Reviewed   Physical Exam   GEN: No acute distress.  Chronically ill appearing Neck: supple Cardiac: RRR Respiratory: Diminished BS throughout; no wheeze GI: Soft, NT/ND MS: No edema Neuro:  Grossly  intact Psych: Normal affect   Labs    High Sensitivity Troponin:   Recent Labs  Lab 06/26/21 2158 06/27/21 0156  TROPONINIHS 56* 53*      Chemistry Recent Labs  Lab 06/26/21 0457 06/26/21 0501 06/26/21 0509 06/27/21 0017 06/28/21 0150  NA 136  138 137 136 137 134*  K 4.1  4.0 4.1 4.2 3.7 3.6  CL 104  104 104  --  101 103  CO2 22  --   --  24 25  GLUCOSE 84  83 80  --  139* 138*  BUN 31*  35* 37*  --  30* 23  CREATININE 1.08*  1.00 1.00  --  1.25* 1.07*  CALCIUM 10.5*  --   --  9.7 9.2  PROT 6.0*  --   --   --   --   ALBUMIN 3.5  --   --   --   --   AST 23  --   --   --   --   ALT 27  --   --   --   --  ALKPHOS 99  --   --   --   --   BILITOT 0.6  --   --   --   --   GFRNONAA 53*  --   --  45* 54*  ANIONGAP 10  --   --  12 6      Hematology Recent Labs  Lab 06/26/21 0457 06/26/21 0501 06/26/21 0509 06/27/21 0017 06/28/21 0150  WBC 17.4*  --   --  17.6* 8.7  RBC 4.66  --   --  3.81* 3.70*  HGB 15.3*  16.0*   < > 16.0* 13.3 12.2  HCT 45.2  47.0*   < > 47.0* 36.9 35.6*  MCV 97.0  --   --  96.9 96.2  MCH 32.8  --   --  34.9* 33.0  MCHC 33.8  --   --  36.0 34.3  RDW 14.3  --   --  14.3 14.0  PLT 251  --   --  223 204   < > = values in this interval not displayed.     BNP Recent Labs  Lab 06/26/21 0457  BNP 186.1*      Radiology    ECHOCARDIOGRAM COMPLETE  Result Date: 06/27/2021    ECHOCARDIOGRAM REPORT   Patient Name:   [name] [facility] Date of Exam: 06/27/2021 Medical Rec #:  [id number]        Height:       63.0 in Accession #:    [id number]       Weight:       195.5 lb Date of Birth:  November 22, 1945        BSA:          1.915 m Patient Age:    76 years         BP:           155/90 mmHg Patient Gender: F                HR:           61 bpm. Exam Location:  Inpatient Procedure: 2D Echo, Cardiac Doppler and Color Doppler Indications:    CHF-acute systolic  History:        Patient has prior history of Echocardiogram examinations, most                  recent 02/07/2021. Previous Myocardial Infarction and CAD; Risk                 Factors:Hypertension. ETOH abuse.  Sonographer:    Clayton Lefort RDCS (AE) Referring Phys: [id number] RONDELL A SMITH IMPRESSIONS  1. Left ventricular ejection fraction, by estimation, is 55 to 60%. The left ventricle has normal function. The left ventricle demonstrates regional wall motion abnormalities (see scoring diagram/findings for description). There is moderate eccentric left ventricular hypertrophy. Left ventricular diastolic parameters were normal. There is hypokinesis of the left ventricular, basal inferior wall.  2. Right ventricular systolic function is normal. The right ventricular size is normal. Tricuspid regurgitation signal is inadequate for assessing PA pressure.  3. Right atrial size was mildly dilated.  4. The mitral valve is abnormal. Trivial mitral valve regurgitation. No evidence of mitral stenosis. Moderate mitral annular calcification.  5. The aortic valve is tricuspid. There is mild calcification of the aortic valve. There is mild thickening of the aortic valve. Aortic valve regurgitation is not visualized. Aortic valve sclerosis/calcification is present, without any evidence of aortic stenosis.  6. The inferior vena  cava is normal in size with greater than 50% respiratory variability, suggesting right atrial pressure of 3 mmHg. Comparison(s): Prior images reviewed side by side. Changes from prior study are noted. EF and septal wall motion normalized compared to prior study. Conclusion(s)/Recommendation(s): LV function improved compared to prior. FINDINGS  Left Ventricle: Left ventricular ejection fraction, by estimation, is 55 to 60%. The left ventricle has normal function. The left ventricle demonstrates regional wall motion abnormalities. The left ventricular internal cavity size was normal in size. There is moderate eccentric left ventricular hypertrophy. Left ventricular diastolic parameters were normal. Right  Ventricle: The right ventricular size is normal. No increase in right ventricular wall thickness. Right ventricular systolic function is normal. Tricuspid regurgitation signal is inadequate for assessing PA pressure. Left Atrium: Left atrial size was normal in size. Right Atrium: Right atrial size was mildly dilated. Pericardium: There is no evidence of pericardial effusion. Mitral Valve: The mitral valve is abnormal. Moderate mitral annular calcification. Trivial mitral valve regurgitation. No evidence of mitral valve stenosis. Tricuspid Valve: The tricuspid valve is normal in structure. Tricuspid valve regurgitation is trivial. No evidence of tricuspid stenosis. Aortic Valve: The aortic valve is tricuspid. There is mild calcification of the aortic valve. There is mild thickening of the aortic valve. Aortic valve regurgitation is not visualized. Aortic valve sclerosis/calcification is present, without any evidence of aortic stenosis. Aortic valve mean gradient measures 4.0 mmHg. Aortic valve peak gradient measures 7.8 mmHg. Aortic valve area, by VTI measures 2.66 cm. Pulmonic Valve: The pulmonic valve was not well visualized. Pulmonic valve regurgitation is trivial. Aorta: The aortic root and ascending aorta are structurally normal, with no evidence of dilitation and the aortic arch was not well visualized. Venous: The inferior vena cava is normal in size with greater than 50% respiratory variability, suggesting right atrial pressure of 3 mmHg. IAS/Shunts: The atrial septum is grossly normal.  LEFT VENTRICLE PLAX 2D LVIDd:         4.50 cm   Diastology LVIDs:         3.20 cm   LV e' medial:    5.77 cm/s LV PW:         1.30 cm   LV E/e' medial:  10.4 LV IVS:        1.60 cm   LV e' lateral:   7.29 cm/s LVOT diam:     2.20 cm   LV E/e' lateral: 8.2 LV SV:         72 LV SV Index:   38 LVOT Area:     3.80 cm  RIGHT VENTRICLE             IVC RV Basal diam:  3.20 cm     IVC diam: 1.60 cm RV S prime:     16.20 cm/s TAPSE  (M-mode): 1.9 cm LEFT ATRIUM             Index        RIGHT ATRIUM           Index LA diam:        2.40 cm 1.25 cm/m   RA Area:     20.30 cm LA Vol (A2C):   44.9 ml 23.44 ml/m  RA Volume:   58.50 ml  30.54 ml/m LA Vol (A4C):   48.9 ml 25.53 ml/m LA Biplane Vol: 49.8 ml 26.00 ml/m  AORTIC VALVE AV Area (Vmax):    2.51 cm AV Area (Vmean):   2.46 cm AV Area (VTI):  2.66 cm AV Vmax:           140.00 cm/s AV Vmean:          89.800 cm/s AV VTI:            0.270 m AV Peak Grad:      7.8 mmHg AV Mean Grad:      4.0 mmHg LVOT Vmax:         92.50 cm/s LVOT Vmean:        58.100 cm/s LVOT VTI:          0.189 m LVOT/AV VTI ratio: 0.70  AORTA Ao Root diam: 3.40 cm Ao Asc diam:  3.30 cm MITRAL VALVE MV Area (PHT): 2.43 cm    SHUNTS MV Decel Time: 312 msec    Systemic VTI:  0.19 m MV E velocity: 59.90 cm/s  Systemic Diam: 2.20 cm MV A velocity: 77.10 cm/s MV E/A ratio:  0.78 Buford Dresser MD Electronically signed by Buford Dresser MD Signature Date/Time: 06/27/2021/4:14:06 PM    Final      Patient Profile     76 year old female with past medical history of coronary artery disease status post PCI of the RCA in 2000, hypertension, chronic combined systolic/diastolic ingestive heart failure, severe COPD home O2 dependent, renal cell cancer status post partial nephrectomy, vaginal cancer, history of carotid endarterectomy, no CODE BLUE for evaluation of acute on chronic combined systolic/diastolic congestive heart failure.  Patient had a non-ST elevation myocardial infarction in January 2023.  Cardiac catheterization at that time revealed 80% proximal LAD, 70% mid LAD, 70% OM 2, 60% proximal right coronary artery, 85% distal right coronary artery; elevated left ventricular end-diastolic pressure.  Patient was not felt to be candidate for coronary artery bypass and graft due to comorbidities including home O2 dependent COPD.  It was also felt that PCI was not an option and medical therapy recommended.   Patient presented with acute onset dyspnea and CHF requiring BiPAP.  Assessment & Plan    1 acute on chronic combined systolic/diastolic congestive heart failure-patient feels as though she is back to baseline.  As outlined previously troponins were not consistent with acute coronary syndrome.  I will change Lasix to 40 mg daily.  I would like to keep her on the dry side as she has very poor pulmonary reserve (severe emphysema and restrictive lung disease).  Follow renal function closely and can decrease Lasix if needed.  Follow-up echocardiogram shows preserved LV function.  As outlined previously she had a prior cardiac catheterization in January and was felt not to be a candidate for coronary artery bypass or PCI.  Continue medical therapy (aspirin, Plavix, statin).     2 hyperlipidemia-continue statin.   3 hypertension-blood pressure is somewhat labile.  We will continue present medications and follow.  4 ischemic cardiomyopathy-LV function has improved.  Continue bisoprolol.  Note patient is no CODE BLUE and plan apparently is discharged to hospice.  Check bmet one week following DC. Cardiology will sign off.  We will arrange follow-up with APP in 2 to 4 weeks.  Follow-up Dr. Martinique 3 months.  Please call with questions.  For questions or updates, please contact [place] Please consult [email or web address] for contact info under        Signed, Kirk Ruths, MD  06/28/2021, 8:30 AM

## 2021-06-28 NOTE — Evaluation (Signed)
Occupational Therapy Evaluation Patient Details Name: [name] MRN: [id number] DOB: 02/24/1945 Today's Date: 06/28/2021   History of Present Illness Pt is a 76 y.o. female who presented 06/26/21 with SOB. Admitted with acute hypoxic respiratory failure secondary to aspiration pneumonia and acute exacerbation of underlying severe emphysema/COPD. PMH: COPD/emphysema on 2 L of oxygen, CAD s/p PCI, systolic CHF last EF 27-74%, and RCC s/p nephrectomy   Clinical Impression   PTA pt lives with her friend who can assist at DC with her care. Pt easily fatigues and with increased SOB with activity. Educated pt on options to increase independence/decrease burden of care with activities. Pt has a rollator at home which she prefers to use rather than a wc. Discussed having her friend bring the 3in1 to her for toileting and bathing needs to conserve energy as pt does not feel she can ambulate the distance to her bathroom at this time. Pt states she has a toilet riser and needs a 3in1 - CM notified.  No further OT needed.     Recommendations for follow up therapy are one component of a multi-disciplinary discharge planning process, led by the attending physician.  Recommendations may be updated based on patient status, additional functional criteria and insurance authorization.   Follow Up Recommendations  No OT follow up (following up with Hospice)    Assistance Recommended at Discharge Frequent or constant Supervision/Assistance  Patient can return home with the following A little help with walking and/or transfers;A lot of help with bathing/dressing/bathroom;Assistance with cooking/housework;Direct supervision/assist for medications management;Assist for transportation;Help with stairs or ramp for entrance    Functional Status Assessment     Equipment Recommendations  BSC/3in1    Recommendations for Other Services       Precautions / Restrictions Precautions Precautions: Fall;Other  (comment) Precaution Comments: watch SpO2 Restrictions Weight Bearing Restrictions: No      Mobility Bed Mobility Overal bed mobility: Needs Assistance Bed Mobility: Supine to Sit, Sit to Supine     Supine to sit: Min assist, HOB elevated Sit to supine: Min assist, HOB elevated   General bed mobility comments: MinA to manage trunk and legs supine <> sit EOB, HOB elevated    Transfers Overall transfer level: Needs assistance Equipment used: Rolling walker (2 wheels) Transfers: Sit to/from Stand Sit to Stand: Min assist           General transfer comment: MinA to power up to stand from EOB      Balance Overall balance assessment: Needs assistance Sitting-balance support: No upper extremity supported, Feet supported Sitting balance-Leahy Scale: Fair     Standing balance support: Bilateral upper extremity supported, During functional activity, Reliant on assistive device for balance Standing balance-Leahy Scale: Poor Standing balance comment: Reliant on RW and external assist                           ADL either performed or assessed with clinical judgement   ADL Overall ADL's : Needs assistance/impaired                                     Functional mobility during ADLs: Minimal assistance General ADL Comments: Fatigues easily with ADL tasks; able to transfer to/from [facility] with min A and increased SOB; friend can help at this level at home; Discussed using rollator as a wc if needed with her friend pulling her  backwards on the rollator; bring the 3in1 to her rather than walking long distances if needed; educated on monitoring activity and using pursed lip breathing techniques     Vision         Perception     Praxis      Pertinent Vitals/Pain Pain Assessment Pain Assessment: Faces Faces Pain Scale: Hurts a little bit Breathing: normal Negative Vocalization: none Body Language: relaxed Consolability: distracted or reassured by  voice/touch Pain Location: generalized Pain Descriptors / Indicators: Discomfort Pain Intervention(s): Limited activity within patient's tolerance     Hand Dominance Left   Extremity/Trunk Assessment Upper Extremity Assessment Upper Extremity Assessment: Generalized weakness (R shoulder deficits at baeline)   Lower Extremity Assessment Lower Extremity Assessment: Defer to PT evaluation (hx of cervical issues)   Cervical / Trunk Assessment Cervical / Trunk Assessment: Kyphotic (forward head)   Communication Communication Communication: No difficulties   Cognition Arousal/Alertness: Awake/alert Behavior During Therapy: WFL for tasks assessed/performed Overall Cognitive Status: Within Functional Limits for tasks assessed                                       General Comments  Discussed use of energy conservation strateiges in addition to pursed lip breathing    Exercises     Shoulder Instructions      Home Living Family/patient expects to be discharged to:: Private residence Living Arrangements: Non-relatives/Friends Available Help at Discharge: Friend(s);Available 24 hours/day Type of Home: House Home Access: Stairs to enter [employer] of Steps: 1 Entrance Stairs-Rails: None Home Layout: One level     Bathroom Shower/Tub: Teacher, early years/pre: Handicapped height     Home Equipment: Rollator (4 wheels);Tub bench;Grab bars - tub/shower;Toilet riser   Additional Comments: sleeps in a recliner; on 2L O2 all times      Prior Functioning/Environment Prior Level of Function : Needs assist             Mobility Comments: Mod I using rollator for mobility. Light assistance to manage AD on stairs ADLs Comments: Assistance needed to manage shirt and ADLs involving shoulder flexion due to UE AROM limitations due to neck deficits; friend assists with sponge baths        OT Problem List:        OT Treatment/Interventions:       OT Goals(Current goals can be found in the care plan section) Acute Rehab OT Goals Patient Stated Goal: to go home today OT Goal Formulation: All assessment and education complete, DC therapy  OT Frequency:      Co-evaluation              AM-PAC OT "6 Clicks" Daily Activity     Outcome Measure Help from another person eating meals?: None Help from another person taking care of personal grooming?: A Little Help from another person toileting, which includes using toliet, bedpan, or urinal?: A Little Help from another person bathing (including washing, rinsing, drying)?: A Little Help from another person to put on and taking off regular upper body clothing?: A Little Help from another person to put on and taking off regular lower body clothing?: A Little 6 Click Score: 19   End of Session Equipment Utilized During Treatment: Oxygen (2L) Nurse Communication: Other (comment) (DC needs)  Activity Tolerance: Patient tolerated treatment well Patient left: in bed  OT Visit Diagnosis: Other abnormalities of gait and mobility (  R26.89);Muscle weakness (generalized) (M62.81);Pain Pain - part of body:  (generalized)                Time: 1947-1252 OT Time Calculation (min): 13 min Charges:  OT General Charges $OT Visit: 1 Visit OT Evaluation $OT Eval Low Complexity: [place], OT/L   Acute OT Clinical Specialist Acute Rehabilitation Services Pager [phone] Office [phone]   [facility] 06/28/2021, 12:32 PM

## 2021-06-28 NOTE — TOC Progression Note (Addendum)
Transition of Care [facility]) - Progression Note    Patient Details  Name: [name] MRN: [id number] Date of Birth: 10-04-1945  [facility]) CM/SW [place], RN Phone Number:[phone]  06/28/2021, 9:20 AM  Clinical Narrative:    TOC acknowledges consult for " Wants to transition from outpatient PC to hospice - enrolled with [facility]."   [facility] Liaison has documented noted in patients chart. Patient has been approved for Hospice. TOC will follow for any disposition needs.   1155 CM received message from OT stating that patient will need a 3in1. CM has reached out to [place] with [employer]. Per [facility] admissions nurse will handle the DME need for 3in1 when the nurse goes out for assessment.         Expected Discharge Plan and Services                                                 Social Determinants of Health (SDOH) Interventions    Readmission Risk Interventions     No data to display

## 2021-06-29 ENCOUNTER — Telehealth: Payer: Self-pay | Admitting: Pulmonary Disease

## 2021-06-29 DIAGNOSIS — I130 Hypertensive heart and chronic kidney disease with heart failure and stage 1 through stage 4 chronic kidney disease, or unspecified chronic kidney disease: Secondary | ICD-10-CM | POA: Diagnosis not present

## 2021-06-29 DIAGNOSIS — K219 Gastro-esophageal reflux disease without esophagitis: Secondary | ICD-10-CM | POA: Diagnosis not present

## 2021-06-29 DIAGNOSIS — F32A Depression, unspecified: Secondary | ICD-10-CM | POA: Diagnosis not present

## 2021-06-29 DIAGNOSIS — I5042 Chronic combined systolic (congestive) and diastolic (congestive) heart failure: Secondary | ICD-10-CM | POA: Diagnosis not present

## 2021-06-29 DIAGNOSIS — J9621 Acute and chronic respiratory failure with hypoxia: Secondary | ICD-10-CM | POA: Diagnosis not present

## 2021-06-29 DIAGNOSIS — H04129 Dry eye syndrome of unspecified lacrimal gland: Secondary | ICD-10-CM | POA: Diagnosis not present

## 2021-06-29 DIAGNOSIS — J449 Chronic obstructive pulmonary disease, unspecified: Secondary | ICD-10-CM | POA: Diagnosis not present

## 2021-06-29 DIAGNOSIS — Z87891 Personal history of nicotine dependence: Secondary | ICD-10-CM | POA: Diagnosis not present

## 2021-06-29 DIAGNOSIS — E669 Obesity, unspecified: Secondary | ICD-10-CM | POA: Diagnosis not present

## 2021-06-29 DIAGNOSIS — J189 Pneumonia, unspecified organism: Secondary | ICD-10-CM | POA: Diagnosis not present

## 2021-06-29 DIAGNOSIS — J302 Other seasonal allergic rhinitis: Secondary | ICD-10-CM | POA: Diagnosis not present

## 2021-06-29 DIAGNOSIS — I739 Peripheral vascular disease, unspecified: Secondary | ICD-10-CM | POA: Diagnosis not present

## 2021-06-29 DIAGNOSIS — I255 Ischemic cardiomyopathy: Secondary | ICD-10-CM | POA: Diagnosis not present

## 2021-06-29 DIAGNOSIS — I2510 Atherosclerotic heart disease of native coronary artery without angina pectoris: Secondary | ICD-10-CM | POA: Diagnosis not present

## 2021-06-29 DIAGNOSIS — I252 Old myocardial infarction: Secondary | ICD-10-CM | POA: Diagnosis not present

## 2021-06-29 DIAGNOSIS — N1831 Chronic kidney disease, stage 3a: Secondary | ICD-10-CM | POA: Diagnosis not present

## 2021-06-29 NOTE — Telephone Encounter (Signed)
Called [facility] and let them know the info per BI and they verbalized understanding. Nothing further needed.

## 2021-06-29 NOTE — Telephone Encounter (Signed)
Called and spoke with [name] to let them know that I am going to send message to Dr. Valeta Harms and that once we hear back from him we will call them back. She expressed understanding.   [name] would like Dr. Valeta Harms to be the hospice attending for patient. Also would like to know if Dr. Valeta Harms can do symptom management.   Please advise

## 2021-06-30 ENCOUNTER — Telehealth: Payer: Self-pay | Admitting: Pulmonary Disease

## 2021-06-30 NOTE — Telephone Encounter (Addendum)
ATC [facility] but it is after hours left message with on call for them to call tomorrow. From my understanding Dr. Valeta Harms can ask for them to have them manage symptoms if he agrees to be the attending. If he does not want to be the attending then they will need to reach out to her PCP. Will send to Dr. Valeta Harms to see which he prefers.      Garner Nash, DO to Me  [facility] Triage Pool      06/29/21 11:12 AM   Usually [facility] has hospice attendings? I am happy to help but I am not always on call for that. Its probably better to have someone from [facility] to help manage symptoms if there needs to be a doctor available for coverage.   Thanks   BLI   Garner Nash, DO  [facility]  06/29/2021 11:12 AM

## 2021-06-30 NOTE — Telephone Encounter (Signed)
Called and spoke with pharmacist at [facility]. She confirmed that they have orders for budesonide, Maretta Bees and Brovana. They are still processing the order.   Called and spoke with patient. She is aware of the above information and expressed appreciation.   Nothing further needed at time of call.

## 2021-07-01 DIAGNOSIS — I252 Old myocardial infarction: Secondary | ICD-10-CM | POA: Diagnosis not present

## 2021-07-01 DIAGNOSIS — I255 Ischemic cardiomyopathy: Secondary | ICD-10-CM | POA: Diagnosis not present

## 2021-07-01 DIAGNOSIS — I2510 Atherosclerotic heart disease of native coronary artery without angina pectoris: Secondary | ICD-10-CM | POA: Diagnosis not present

## 2021-07-01 DIAGNOSIS — N1831 Chronic kidney disease, stage 3a: Secondary | ICD-10-CM | POA: Diagnosis not present

## 2021-07-01 DIAGNOSIS — I5042 Chronic combined systolic (congestive) and diastolic (congestive) heart failure: Secondary | ICD-10-CM | POA: Diagnosis not present

## 2021-07-01 DIAGNOSIS — I130 Hypertensive heart and chronic kidney disease with heart failure and stage 1 through stage 4 chronic kidney disease, or unspecified chronic kidney disease: Secondary | ICD-10-CM | POA: Diagnosis not present

## 2021-07-01 NOTE — Telephone Encounter (Signed)
Called and spoke with [name] to let her know that Dr. Valeta Harms is happy to be attending but would like someone from there to manage symptoms. She expressed understanding. Nothing further needed at this time.

## 2021-07-02 DIAGNOSIS — I130 Hypertensive heart and chronic kidney disease with heart failure and stage 1 through stage 4 chronic kidney disease, or unspecified chronic kidney disease: Secondary | ICD-10-CM | POA: Diagnosis not present

## 2021-07-02 DIAGNOSIS — I255 Ischemic cardiomyopathy: Secondary | ICD-10-CM | POA: Diagnosis not present

## 2021-07-02 DIAGNOSIS — I2510 Atherosclerotic heart disease of native coronary artery without angina pectoris: Secondary | ICD-10-CM | POA: Diagnosis not present

## 2021-07-02 DIAGNOSIS — N1831 Chronic kidney disease, stage 3a: Secondary | ICD-10-CM | POA: Diagnosis not present

## 2021-07-02 DIAGNOSIS — I252 Old myocardial infarction: Secondary | ICD-10-CM | POA: Diagnosis not present

## 2021-07-02 DIAGNOSIS — I5042 Chronic combined systolic (congestive) and diastolic (congestive) heart failure: Secondary | ICD-10-CM | POA: Diagnosis not present

## 2021-07-06 DIAGNOSIS — I252 Old myocardial infarction: Secondary | ICD-10-CM | POA: Diagnosis not present

## 2021-07-06 DIAGNOSIS — N1831 Chronic kidney disease, stage 3a: Secondary | ICD-10-CM | POA: Diagnosis not present

## 2021-07-06 DIAGNOSIS — I2510 Atherosclerotic heart disease of native coronary artery without angina pectoris: Secondary | ICD-10-CM | POA: Diagnosis not present

## 2021-07-06 DIAGNOSIS — I5042 Chronic combined systolic (congestive) and diastolic (congestive) heart failure: Secondary | ICD-10-CM | POA: Diagnosis not present

## 2021-07-06 DIAGNOSIS — I130 Hypertensive heart and chronic kidney disease with heart failure and stage 1 through stage 4 chronic kidney disease, or unspecified chronic kidney disease: Secondary | ICD-10-CM | POA: Diagnosis not present

## 2021-07-06 DIAGNOSIS — I255 Ischemic cardiomyopathy: Secondary | ICD-10-CM | POA: Diagnosis not present

## 2021-07-08 DIAGNOSIS — N1831 Chronic kidney disease, stage 3a: Secondary | ICD-10-CM | POA: Diagnosis not present

## 2021-07-08 DIAGNOSIS — I5042 Chronic combined systolic (congestive) and diastolic (congestive) heart failure: Secondary | ICD-10-CM | POA: Diagnosis not present

## 2021-07-08 DIAGNOSIS — I130 Hypertensive heart and chronic kidney disease with heart failure and stage 1 through stage 4 chronic kidney disease, or unspecified chronic kidney disease: Secondary | ICD-10-CM | POA: Diagnosis not present

## 2021-07-08 DIAGNOSIS — I2510 Atherosclerotic heart disease of native coronary artery without angina pectoris: Secondary | ICD-10-CM | POA: Diagnosis not present

## 2021-07-08 DIAGNOSIS — I255 Ischemic cardiomyopathy: Secondary | ICD-10-CM | POA: Diagnosis not present

## 2021-07-08 DIAGNOSIS — I252 Old myocardial infarction: Secondary | ICD-10-CM | POA: Diagnosis not present

## 2021-07-13 DIAGNOSIS — N1831 Chronic kidney disease, stage 3a: Secondary | ICD-10-CM | POA: Diagnosis not present

## 2021-07-13 DIAGNOSIS — I255 Ischemic cardiomyopathy: Secondary | ICD-10-CM | POA: Diagnosis not present

## 2021-07-13 DIAGNOSIS — I130 Hypertensive heart and chronic kidney disease with heart failure and stage 1 through stage 4 chronic kidney disease, or unspecified chronic kidney disease: Secondary | ICD-10-CM | POA: Diagnosis not present

## 2021-07-13 DIAGNOSIS — I2510 Atherosclerotic heart disease of native coronary artery without angina pectoris: Secondary | ICD-10-CM | POA: Diagnosis not present

## 2021-07-13 DIAGNOSIS — I252 Old myocardial infarction: Secondary | ICD-10-CM | POA: Diagnosis not present

## 2021-07-13 DIAGNOSIS — I5042 Chronic combined systolic (congestive) and diastolic (congestive) heart failure: Secondary | ICD-10-CM | POA: Diagnosis not present

## 2021-07-14 ENCOUNTER — Encounter (HOSPITAL_COMMUNITY): Payer: Self-pay

## 2021-07-14 ENCOUNTER — Emergency Department (HOSPITAL_COMMUNITY)

## 2021-07-14 ENCOUNTER — Other Ambulatory Visit: Payer: Self-pay

## 2021-07-14 ENCOUNTER — Inpatient Hospital Stay (HOSPITAL_COMMUNITY)
Admission: EM | Admit: 2021-07-14 | Discharge: 2021-07-17 | DRG: 190 | Disposition: A | Discharge status: Hospice | Attending: Internal Medicine | Admitting: Internal Medicine

## 2021-07-14 DIAGNOSIS — R9431 Abnormal electrocardiogram [ECG] [EKG]: Secondary | ICD-10-CM | POA: Diagnosis not present

## 2021-07-14 DIAGNOSIS — I110 Hypertensive heart disease with heart failure: Secondary | ICD-10-CM | POA: Diagnosis present

## 2021-07-14 DIAGNOSIS — R52 Pain, unspecified: Secondary | ICD-10-CM | POA: Diagnosis not present

## 2021-07-14 DIAGNOSIS — F319 Bipolar disorder, unspecified: Secondary | ICD-10-CM | POA: Diagnosis present

## 2021-07-14 DIAGNOSIS — I248 Other forms of acute ischemic heart disease: Secondary | ICD-10-CM | POA: Diagnosis present

## 2021-07-14 DIAGNOSIS — J9602 Acute respiratory failure with hypercapnia: Secondary | ICD-10-CM

## 2021-07-14 DIAGNOSIS — Z96651 Presence of right artificial knee joint: Secondary | ICD-10-CM | POA: Diagnosis present

## 2021-07-14 DIAGNOSIS — Z9981 Dependence on supplemental oxygen: Secondary | ICD-10-CM

## 2021-07-14 DIAGNOSIS — J80 Acute respiratory distress syndrome: Secondary | ICD-10-CM | POA: Diagnosis not present

## 2021-07-14 DIAGNOSIS — Z8249 Family history of ischemic heart disease and other diseases of the circulatory system: Secondary | ICD-10-CM

## 2021-07-14 DIAGNOSIS — Z7951 Long term (current) use of inhaled steroids: Secondary | ICD-10-CM

## 2021-07-14 DIAGNOSIS — I509 Heart failure, unspecified: Secondary | ICD-10-CM | POA: Diagnosis not present

## 2021-07-14 DIAGNOSIS — I252 Old myocardial infarction: Secondary | ICD-10-CM

## 2021-07-14 DIAGNOSIS — I5042 Chronic combined systolic (congestive) and diastolic (congestive) heart failure: Secondary | ICD-10-CM | POA: Diagnosis not present

## 2021-07-14 DIAGNOSIS — Z9841 Cataract extraction status, right eye: Secondary | ICD-10-CM

## 2021-07-14 DIAGNOSIS — J9622 Acute and chronic respiratory failure with hypercapnia: Secondary | ICD-10-CM | POA: Diagnosis present

## 2021-07-14 DIAGNOSIS — E785 Hyperlipidemia, unspecified: Secondary | ICD-10-CM | POA: Diagnosis present

## 2021-07-14 DIAGNOSIS — R092 Respiratory arrest: Secondary | ICD-10-CM | POA: Diagnosis not present

## 2021-07-14 DIAGNOSIS — R0602 Shortness of breath: Secondary | ICD-10-CM | POA: Diagnosis not present

## 2021-07-14 DIAGNOSIS — E876 Hypokalemia: Secondary | ICD-10-CM | POA: Diagnosis not present

## 2021-07-14 DIAGNOSIS — R4020 Unspecified coma: Secondary | ICD-10-CM | POA: Diagnosis not present

## 2021-07-14 DIAGNOSIS — Z905 Acquired absence of kidney: Secondary | ICD-10-CM

## 2021-07-14 DIAGNOSIS — G8929 Other chronic pain: Secondary | ICD-10-CM | POA: Diagnosis present

## 2021-07-14 DIAGNOSIS — I130 Hypertensive heart and chronic kidney disease with heart failure and stage 1 through stage 4 chronic kidney disease, or unspecified chronic kidney disease: Secondary | ICD-10-CM | POA: Diagnosis not present

## 2021-07-14 DIAGNOSIS — K219 Gastro-esophageal reflux disease without esophagitis: Secondary | ICD-10-CM | POA: Diagnosis not present

## 2021-07-14 DIAGNOSIS — Z789 Other specified health status: Secondary | ICD-10-CM | POA: Diagnosis not present

## 2021-07-14 DIAGNOSIS — Z8701 Personal history of pneumonia (recurrent): Secondary | ICD-10-CM

## 2021-07-14 DIAGNOSIS — F419 Anxiety disorder, unspecified: Secondary | ICD-10-CM | POA: Diagnosis present

## 2021-07-14 DIAGNOSIS — Z96641 Presence of right artificial hip joint: Secondary | ICD-10-CM | POA: Diagnosis present

## 2021-07-14 DIAGNOSIS — J441 Chronic obstructive pulmonary disease with (acute) exacerbation: Secondary | ICD-10-CM | POA: Diagnosis not present

## 2021-07-14 DIAGNOSIS — Z961 Presence of intraocular lens: Secondary | ICD-10-CM | POA: Diagnosis present

## 2021-07-14 DIAGNOSIS — J9601 Acute respiratory failure with hypoxia: Secondary | ICD-10-CM | POA: Diagnosis not present

## 2021-07-14 DIAGNOSIS — J9621 Acute and chronic respiratory failure with hypoxia: Secondary | ICD-10-CM | POA: Diagnosis not present

## 2021-07-14 DIAGNOSIS — M810 Age-related osteoporosis without current pathological fracture: Secondary | ICD-10-CM | POA: Diagnosis present

## 2021-07-14 DIAGNOSIS — Z85528 Personal history of other malignant neoplasm of kidney: Secondary | ICD-10-CM

## 2021-07-14 DIAGNOSIS — I5020 Unspecified systolic (congestive) heart failure: Secondary | ICD-10-CM | POA: Diagnosis present

## 2021-07-14 DIAGNOSIS — Z87891 Personal history of nicotine dependence: Secondary | ICD-10-CM

## 2021-07-14 DIAGNOSIS — J9600 Acute respiratory failure, unspecified whether with hypoxia or hypercapnia: Secondary | ICD-10-CM | POA: Diagnosis present

## 2021-07-14 DIAGNOSIS — N1831 Chronic kidney disease, stage 3a: Secondary | ICD-10-CM | POA: Diagnosis not present

## 2021-07-14 DIAGNOSIS — J449 Chronic obstructive pulmonary disease, unspecified: Secondary | ICD-10-CM | POA: Diagnosis not present

## 2021-07-14 DIAGNOSIS — I5023 Acute on chronic systolic (congestive) heart failure: Secondary | ICD-10-CM | POA: Diagnosis present

## 2021-07-14 DIAGNOSIS — E872 Acidosis, unspecified: Secondary | ICD-10-CM | POA: Diagnosis not present

## 2021-07-14 DIAGNOSIS — R404 Transient alteration of awareness: Secondary | ICD-10-CM | POA: Diagnosis not present

## 2021-07-14 DIAGNOSIS — E669 Obesity, unspecified: Secondary | ICD-10-CM | POA: Diagnosis present

## 2021-07-14 DIAGNOSIS — Z66 Do not resuscitate: Secondary | ICD-10-CM | POA: Diagnosis not present

## 2021-07-14 DIAGNOSIS — Z955 Presence of coronary angioplasty implant and graft: Secondary | ICD-10-CM

## 2021-07-14 DIAGNOSIS — J302 Other seasonal allergic rhinitis: Secondary | ICD-10-CM | POA: Diagnosis not present

## 2021-07-14 DIAGNOSIS — I469 Cardiac arrest, cause unspecified: Secondary | ICD-10-CM | POA: Diagnosis present

## 2021-07-14 DIAGNOSIS — Z515 Encounter for palliative care: Secondary | ICD-10-CM

## 2021-07-14 DIAGNOSIS — I255 Ischemic cardiomyopathy: Secondary | ICD-10-CM | POA: Diagnosis not present

## 2021-07-14 DIAGNOSIS — R638 Other symptoms and signs concerning food and fluid intake: Secondary | ICD-10-CM | POA: Diagnosis not present

## 2021-07-14 DIAGNOSIS — Z79899 Other long term (current) drug therapy: Secondary | ICD-10-CM

## 2021-07-14 DIAGNOSIS — I2510 Atherosclerotic heart disease of native coronary artery without angina pectoris: Secondary | ICD-10-CM | POA: Diagnosis not present

## 2021-07-14 DIAGNOSIS — H04129 Dry eye syndrome of unspecified lacrimal gland: Secondary | ICD-10-CM | POA: Diagnosis not present

## 2021-07-14 DIAGNOSIS — J189 Pneumonia, unspecified organism: Secondary | ICD-10-CM | POA: Diagnosis present

## 2021-07-14 DIAGNOSIS — Z8544 Personal history of malignant neoplasm of other female genital organs: Secondary | ICD-10-CM

## 2021-07-14 DIAGNOSIS — G245 Blepharospasm: Secondary | ICD-10-CM | POA: Diagnosis present

## 2021-07-14 DIAGNOSIS — Z7902 Long term (current) use of antithrombotics/antiplatelets: Secondary | ICD-10-CM

## 2021-07-14 DIAGNOSIS — Z881 Allergy status to other antibiotic agents status: Secondary | ICD-10-CM

## 2021-07-14 DIAGNOSIS — I739 Peripheral vascular disease, unspecified: Secondary | ICD-10-CM | POA: Diagnosis not present

## 2021-07-14 DIAGNOSIS — Z7189 Other specified counseling: Secondary | ICD-10-CM | POA: Diagnosis not present

## 2021-07-14 DIAGNOSIS — Z7982 Long term (current) use of aspirin: Secondary | ICD-10-CM

## 2021-07-14 DIAGNOSIS — Z9842 Cataract extraction status, left eye: Secondary | ICD-10-CM

## 2021-07-14 DIAGNOSIS — F32A Depression, unspecified: Secondary | ICD-10-CM | POA: Diagnosis not present

## 2021-07-14 DIAGNOSIS — Z6835 Body mass index (BMI) 35.0-35.9, adult: Secondary | ICD-10-CM

## 2021-07-14 DIAGNOSIS — Z885 Allergy status to narcotic agent status: Secondary | ICD-10-CM

## 2021-07-14 DIAGNOSIS — Z923 Personal history of irradiation: Secondary | ICD-10-CM

## 2021-07-14 DIAGNOSIS — Z888 Allergy status to other drugs, medicaments and biological substances status: Secondary | ICD-10-CM

## 2021-07-14 LAB — COMPREHENSIVE METABOLIC PANEL
ALT: 31 U/L (ref 0–44)
AST: 43 U/L — ABNORMAL HIGH (ref 15–41)
Albumin: 3.3 g/dL — ABNORMAL LOW (ref 3.5–5.0)
Alkaline Phosphatase: 120 U/L (ref 38–126)
Anion gap: 21 — ABNORMAL HIGH (ref 5–15)
BUN: 27 mg/dL — ABNORMAL HIGH (ref 8–23)
CO2: 15 mmol/L — ABNORMAL LOW (ref 22–32)
Calcium: 10 mg/dL (ref 8.9–10.3)
Chloride: 104 mmol/L (ref 98–111)
Creatinine, Ser: 1.16 mg/dL — ABNORMAL HIGH (ref 0.44–1.00)
GFR, Estimated: 49 mL/min — ABNORMAL LOW (ref >60)
Glucose, Bld: 234 mg/dL — ABNORMAL HIGH (ref 70–99)
Potassium: 5.1 mmol/L (ref 3.5–5.1)
Sodium: 140 mmol/L (ref 135–145)
Total Bilirubin: 1.2 mg/dL (ref 0.3–1.2)
Total Protein: 6.4 g/dL — ABNORMAL LOW (ref 6.5–8.1)

## 2021-07-14 LAB — CBC WITH DIFFERENTIAL/PLATELET
Abs Immature Granulocytes: 0.88 10*3/uL — ABNORMAL HIGH (ref 0.00–0.07)
Basophils Absolute: 0.1 10*3/uL (ref 0.0–0.1)
Basophils Relative: 1 %
Eosinophils Absolute: 0.2 10*3/uL (ref 0.0–0.5)
Eosinophils Relative: 1 %
HCT: 45.5 % (ref 36.0–46.0)
Hemoglobin: 15.8 g/dL — ABNORMAL HIGH (ref 12.0–15.0)
Immature Granulocytes: 5 %
Lymphocytes Relative: 21 %
Lymphs Abs: 4 10*3/uL (ref 0.7–4.0)
MCH: 35.6 pg — ABNORMAL HIGH (ref 26.0–34.0)
MCHC: 34.7 g/dL (ref 30.0–36.0)
MCV: 102.5 fL — ABNORMAL HIGH (ref 80.0–100.0)
Monocytes Absolute: 1 10*3/uL (ref 0.1–1.0)
Monocytes Relative: 5 %
Neutro Abs: 13 10*3/uL — ABNORMAL HIGH (ref 1.7–7.7)
Neutrophils Relative %: 67 %
Platelets: 306 10*3/uL (ref 150–400)
RBC: 4.44 MIL/uL (ref 3.87–5.11)
RDW: 14 % (ref 11.5–15.5)
WBC: 19.2 10*3/uL — ABNORMAL HIGH (ref 4.0–10.5)
nRBC: 0 % (ref 0.0–0.2)

## 2021-07-14 LAB — LACTIC ACID, PLASMA
Lactic Acid, Venous: 7 mmol/L (ref 0.5–1.9)
Lactic Acid, Venous: 3.3 mmol/L (ref 0.5–1.9)
Lactic Acid, Venous: 3.1 mmol/L (ref 0.5–1.9)

## 2021-07-14 LAB — PROTIME-INR
INR: 0.9 (ref 0.8–1.2)
Prothrombin Time: 12 seconds (ref 11.4–15.2)

## 2021-07-14 LAB — I-STAT CHEM 8, ED
BUN: 33 mg/dL — ABNORMAL HIGH (ref 8–23)
Calcium, Ion: 1.13 mmol/L — ABNORMAL LOW (ref 1.15–1.40)
Chloride: 107 mmol/L (ref 98–111)
Creatinine, Ser: 1 mg/dL (ref 0.44–1.00)
Glucose, Bld: 231 mg/dL — ABNORMAL HIGH (ref 70–99)
HCT: 50 % — ABNORMAL HIGH (ref 36.0–46.0)
Hemoglobin: 17 g/dL — ABNORMAL HIGH (ref 12.0–15.0)
Potassium: 4.6 mmol/L (ref 3.5–5.1)
Sodium: 136 mmol/L (ref 135–145)
TCO2: 23 mmol/L (ref 22–32)

## 2021-07-14 LAB — CREATININE, SERUM
Creatinine, Ser: 1.08 mg/dL — ABNORMAL HIGH (ref 0.44–1.00)
GFR, Estimated: 53 mL/min — ABNORMAL LOW (ref >60)

## 2021-07-14 LAB — CBC
HCT: 40.3 % (ref 36.0–46.0)
Hemoglobin: 13.8 g/dL (ref 12.0–15.0)
MCH: 35.2 pg — ABNORMAL HIGH (ref 26.0–34.0)
MCHC: 34.2 g/dL (ref 30.0–36.0)
MCV: 102.8 fL — ABNORMAL HIGH (ref 80.0–100.0)
Platelets: 214 10*3/uL (ref 150–400)
RBC: 3.92 MIL/uL (ref 3.87–5.11)
RDW: 14.2 % (ref 11.5–15.5)
WBC: 18.3 10*3/uL — ABNORMAL HIGH (ref 4.0–10.5)
nRBC: 0 % (ref 0.0–0.2)

## 2021-07-14 LAB — I-STAT VENOUS BLOOD GAS, ED
Acid-base deficit: 3 mmol/L — ABNORMAL HIGH (ref 0.0–2.0)
Bicarbonate: 23.1 mmol/L (ref 20.0–28.0)
Calcium, Ion: 1.11 mmol/L — ABNORMAL LOW (ref 1.15–1.40)
HCT: 49 % — ABNORMAL HIGH (ref 36.0–46.0)
Hemoglobin: 16.7 g/dL — ABNORMAL HIGH (ref 12.0–15.0)
O2 Saturation: 100 %
Potassium: 4.9 mmol/L (ref 3.5–5.1)
Sodium: 135 mmol/L (ref 135–145)
TCO2: 24 mmol/L (ref 22–32)
pCO2, Ven: 43.3 mmHg — ABNORMAL LOW (ref 44–60)
pH, Ven: 7.335 (ref 7.25–7.43)
pO2, Ven: 215 mmHg — ABNORMAL HIGH (ref 32–45)

## 2021-07-14 LAB — I-STAT ARTERIAL BLOOD GAS, ED
Acid-Base Excess: 2 mmol/L (ref 0.0–2.0)
Bicarbonate: 29.4 mmol/L — ABNORMAL HIGH (ref 20.0–28.0)
Calcium, Ion: 1.31 mmol/L (ref 1.15–1.40)
HCT: 42 % (ref 36.0–46.0)
Hemoglobin: 14.3 g/dL (ref 12.0–15.0)
O2 Saturation: 94 %
Patient temperature: 98
Potassium: 3.7 mmol/L (ref 3.5–5.1)
Sodium: 138 mmol/L (ref 135–145)
TCO2: 31 mmol/L (ref 22–32)
pCO2 arterial: 57.9 mmHg — ABNORMAL HIGH (ref 32–48)
pH, Arterial: 7.312 — ABNORMAL LOW (ref 7.35–7.45)
pO2, Arterial: 77 mmHg — ABNORMAL LOW (ref 83–108)

## 2021-07-14 LAB — BRAIN NATRIURETIC PEPTIDE: B Natriuretic Peptide: 573.9 pg/mL — ABNORMAL HIGH (ref 0.0–100.0)

## 2021-07-14 LAB — BLOOD GAS, ARTERIAL
Acid-Base Excess: 1 mmol/L (ref 0.0–2.0)
Bicarbonate: 27.9 mmol/L (ref 20.0–28.0)
O2 Saturation: 97.3 %
Patient temperature: 37
pCO2 arterial: 53 mmHg — ABNORMAL HIGH (ref 32–48)
pH, Arterial: 7.33 — ABNORMAL LOW (ref 7.35–7.45)
pO2, Arterial: 83 mmHg (ref 83–108)

## 2021-07-14 LAB — TROPONIN I (HIGH SENSITIVITY)
Troponin I (High Sensitivity): 35 ng/L — ABNORMAL HIGH (ref <18)
Troponin I (High Sensitivity): 69 ng/L — ABNORMAL HIGH (ref <18)

## 2021-07-14 LAB — STREP PNEUMONIAE URINARY ANTIGEN: Strep Pneumo Urinary Antigen: NEGATIVE

## 2021-07-14 MED ORDER — SODIUM CHLORIDE 0.9 % IV SOLN
2.0000 g | Freq: Once | INTRAVENOUS | Status: AC
  Administered 2021-07-14: 2 g via INTRAVENOUS
  Filled 2021-07-14: qty 12.5

## 2021-07-14 MED ORDER — FENTANYL CITRATE PF 50 MCG/ML IJ SOSY
25.0000 ug | PREFILLED_SYRINGE | INTRAMUSCULAR | Status: DC | PRN
  Administered 2021-07-14 – 2021-07-16 (×11): 25 ug via INTRAVENOUS
  Filled 2021-07-14 (×11): qty 1

## 2021-07-14 MED ORDER — FUROSEMIDE 10 MG/ML IJ SOLN
40.0000 mg | Freq: Two times a day (BID) | INTRAMUSCULAR | Status: DC
Start: 2021-07-14 — End: 2021-07-18
  Administered 2021-07-14 – 2021-07-17 (×7): 40 mg via INTRAVENOUS
  Filled 2021-07-14 (×7): qty 4

## 2021-07-14 MED ORDER — ONDANSETRON HCL 4 MG/2ML IJ SOLN: 4.0000 mg | Freq: Four times a day (QID) | INTRAMUSCULAR | Status: DC | PRN

## 2021-07-14 MED ORDER — MAGNESIUM SULFATE 2 GM/50ML IV SOLN
2.0000 g | Freq: Once | INTRAVENOUS | Status: AC
  Administered 2021-07-14: 2 g via INTRAVENOUS
  Filled 2021-07-14: qty 50

## 2021-07-14 MED ORDER — ALBUTEROL SULFATE (2.5 MG/3ML) 0.083% IN NEBU
10.0000 mg/h | INHALATION_SOLUTION | Freq: Once | RESPIRATORY_TRACT | Status: AC
  Administered 2021-07-14: 10 mg/h via RESPIRATORY_TRACT
  Filled 2021-07-14: qty 12

## 2021-07-14 MED ORDER — CLOPIDOGREL BISULFATE 75 MG PO TABS
75.0000 mg | ORAL_TABLET | Freq: Every day | ORAL | Status: DC
  Administered 2021-07-15 – 2021-07-17 (×3): 75 mg via ORAL
  Filled 2021-07-14 (×3): qty 1

## 2021-07-14 MED ORDER — SERTRALINE HCL 50 MG PO TABS
150.0000 mg | ORAL_TABLET | Freq: Every day | ORAL | Status: DC
  Administered 2021-07-15 – 2021-07-17 (×3): 150 mg via ORAL
  Filled 2021-07-14 (×6): qty 1

## 2021-07-14 MED ORDER — PANTOPRAZOLE SODIUM 40 MG PO TBEC
40.0000 mg | DELAYED_RELEASE_TABLET | Freq: Every day | ORAL | Status: DC
  Administered 2021-07-15 – 2021-07-16 (×2): 40 mg via ORAL
  Filled 2021-07-14 (×2): qty 1

## 2021-07-14 MED ORDER — BUDESONIDE 0.5 MG/2ML IN SUSP
0.5000 mg | Freq: Two times a day (BID) | RESPIRATORY_TRACT | Status: DC
Start: 2021-07-14 — End: 2021-07-18
  Administered 2021-07-14 – 2021-07-17 (×7): 0.5 mg via RESPIRATORY_TRACT
  Filled 2021-07-14 (×7): qty 2

## 2021-07-14 MED ORDER — KETOROLAC TROMETHAMINE 15 MG/ML IJ SOLN
15.0000 mg | Freq: Once | INTRAMUSCULAR | Status: AC
  Administered 2021-07-14: 15 mg via INTRAVENOUS
  Filled 2021-07-14: qty 1

## 2021-07-14 MED ORDER — REVEFENACIN 175 MCG/3ML IN SOLN
175.0000 ug | Freq: Every day | RESPIRATORY_TRACT | Status: DC
Start: 2021-07-14 — End: 2021-07-18
  Administered 2021-07-15 – 2021-07-17 (×3): 175 ug via RESPIRATORY_TRACT
  Filled 2021-07-14 (×5): qty 3

## 2021-07-14 MED ORDER — MAGNESIUM SULFATE 2 GM/50ML IV SOLN
2.0000 g | Freq: Once | INTRAVENOUS | Status: AC
Start: 2021-07-14 — End: 2021-07-14
  Administered 2021-07-14: 2 g via INTRAVENOUS
  Filled 2021-07-14: qty 50

## 2021-07-14 MED ORDER — IPRATROPIUM BROMIDE 0.02 % IN SOLN
0.5000 mg | Freq: Once | RESPIRATORY_TRACT | Status: AC
  Administered 2021-07-14: 0.5 mg via RESPIRATORY_TRACT
  Filled 2021-07-14: qty 2.5

## 2021-07-14 MED ORDER — ACETAMINOPHEN 325 MG PO TABS
650.0000 mg | ORAL_TABLET | ORAL | Status: DC | PRN
  Administered 2021-07-15: 650 mg via ORAL
  Filled 2021-07-14: qty 2

## 2021-07-14 MED ORDER — SODIUM CHLORIDE 0.9 % IV SOLN
2.0000 g | Freq: Three times a day (TID) | INTRAVENOUS | Status: DC
  Administered 2021-07-14 – 2021-07-15 (×3): 2 g via INTRAVENOUS
  Filled 2021-07-14 (×3): qty 12.5

## 2021-07-14 MED ORDER — HEPARIN SODIUM (PORCINE) 5000 UNIT/ML IJ SOLN
5000.0000 [IU] | Freq: Three times a day (TID) | INTRAMUSCULAR | Status: DC
  Administered 2021-07-14 – 2021-07-16 (×7): 5000 [IU] via SUBCUTANEOUS
  Filled 2021-07-14 (×7): qty 1

## 2021-07-14 MED ORDER — VANCOMYCIN HCL 750 MG/150ML IV SOLN
750.0000 mg | INTRAVENOUS | Status: DC
Start: 2021-07-15 — End: 2021-07-15

## 2021-07-14 MED ORDER — ONDANSETRON HCL 4 MG PO TABS: 4.0000 mg | ORAL_TABLET | Freq: Four times a day (QID) | ORAL | Status: DC | PRN

## 2021-07-14 MED ORDER — ASPIRIN 81 MG PO TBEC
81.0000 mg | DELAYED_RELEASE_TABLET | Freq: Every morning | ORAL | Status: DC
  Administered 2021-07-15 – 2021-07-17 (×3): 81 mg via ORAL
  Filled 2021-07-14 (×3): qty 1

## 2021-07-14 MED ORDER — LAMOTRIGINE 25 MG PO TABS
200.0000 mg | ORAL_TABLET | Freq: Two times a day (BID) | ORAL | Status: DC
  Administered 2021-07-15 – 2021-07-17 (×6): 200 mg via ORAL
  Filled 2021-07-14 (×7): qty 8

## 2021-07-14 MED ORDER — ARIPIPRAZOLE 10 MG PO TABS
10.0000 mg | ORAL_TABLET | Freq: Every day | ORAL | Status: DC
  Administered 2021-07-15 – 2021-07-17 (×3): 10 mg via ORAL
  Filled 2021-07-14 (×4): qty 1

## 2021-07-14 MED ORDER — OXYCODONE HCL 5 MG PO TABS
10.0000 mg | ORAL_TABLET | ORAL | Status: DC | PRN
  Administered 2021-07-15 – 2021-07-17 (×13): 10 mg via ORAL
  Filled 2021-07-14 (×13): qty 2

## 2021-07-14 MED ORDER — CELECOXIB 200 MG PO CAPS
400.0000 mg | ORAL_CAPSULE | Freq: Every day | ORAL | Status: DC
  Administered 2021-07-15 – 2021-07-17 (×3): 400 mg via ORAL
  Filled 2021-07-14: qty 2
  Filled 2021-07-14: qty 1
  Filled 2021-07-14 (×2): qty 2
  Filled 2021-07-14: qty 1

## 2021-07-14 MED ORDER — MONTELUKAST SODIUM 10 MG PO TABS
10.0000 mg | ORAL_TABLET | Freq: Every day | ORAL | Status: DC
  Administered 2021-07-15 – 2021-07-17 (×3): 10 mg via ORAL
  Filled 2021-07-14 (×3): qty 1

## 2021-07-14 MED ORDER — FUROSEMIDE 10 MG/ML IJ SOLN
40.0000 mg | Freq: Once | INTRAMUSCULAR | Status: AC
  Administered 2021-07-14: 40 mg via INTRAVENOUS
  Filled 2021-07-14: qty 4

## 2021-07-14 MED ORDER — VANCOMYCIN HCL 2000 MG/400ML IV SOLN
2000.0000 mg | Freq: Once | INTRAVENOUS | Status: AC
  Administered 2021-07-14: 2000 mg via INTRAVENOUS
  Filled 2021-07-14: qty 400

## 2021-07-14 MED ORDER — ROFLUMILAST 500 MCG PO TABS
500.0000 ug | ORAL_TABLET | Freq: Every day | ORAL | Status: DC
  Administered 2021-07-15 – 2021-07-17 (×3): 500 ug via ORAL
  Filled 2021-07-14 (×4): qty 1

## 2021-07-14 MED ORDER — SODIUM CHLORIDE 0.9 % IV SOLN
500.0000 mg | INTRAVENOUS | Status: DC
  Administered 2021-07-14 – 2021-07-15 (×2): 500 mg via INTRAVENOUS
  Filled 2021-07-14 (×2): qty 5

## 2021-07-14 NOTE — H&P (Addendum)
History and Physical    [name] GHW:[id number] DOB: 06/21/1945 DOA: 07/14/2021  PCP: Harlan Stains, MD  Patient coming from:   I have personally briefly reviewed patient's old medical records in [place]  Chief Complaint:  acute respiratory code  HPI: [name] is a 76 y.o. female with medical history significant for COPD/emphysema on 2 L of oxygen, CAD s/p PCI, systolic CHF last EF 78-93%,YBO, HLD, bipolar d/o and RCC s/p nephrectomy who  has interim history of admission 06/26/2021-06/28/2021 for acute hypoxic respiratory failure secondary to aspiration pneumonia in background of severe emphysema.presented. On that admission patient was seen by palliative care and accepted to hospice and was made a DNI/DNR.  Per d/c note plans were made for hospice care only , with no plans for  future hospitalization per patient wishes.  However, patient presents today after acute episode of shortness of breath that progressed at home. Per family home treatment were not effective and 911 was called.   Per family and patient symptoms were acute, she states prior to onset she was at her baseline. She notes currently soreness, back and chest s/p cpr but notes, no n/v/d/dysuria/ HA/focal weakness/ or fever or chills. She states she is comfortable in reference to her current work of breathing  ED Course:  On arrival patient was noted have increase wob and then collapsed per notes. AT that time no pulse was felt and CPR was started x 2 min, no medications were administered. S/p 2 min of cpr patient regained pulse and king LT airway was placed, treated with albuterol , solumedrol as well as midazolam  and patient was transferred to ED. On arrival to ED it was noted that patient was a DNI/DNR and patient did not desire intubation.  At that time [facility] LT airway was removed and patient was placed on BIPAP. Hospice was called who evaluated patient.  Patient currently notes her desire for hospitalization  but confirms her dni/dnr status.    Of note evaluation in ED revealed diagnosis of Acute hypoxic respiratory failure due to recurrent PNA with acute COPD exacerbation Vitals  Temp 96.4 Bp 162/90 Sat 87%, rr 30  s/p extubation Labs Wbc :19.2 , hgb 15.8, pmn elevated 13 with left shift Lactic  7 repeat 3.3 Na: 140, K 5.1, bicarg 15, gly 234, cr 1.16, ast 43 AG 21 CE , 35, 69 ( baseline 50's) FBP:ZWCHE rhythm, LAD Abg: 7.335/43.3 po2 215          7.31/57.9/po2 77         Cxr: IMPRESSION: Multfactorial degradation, as detailed above.   Significantly worsened aeration, at least partially felt to be attributed to moderate congestive heart failure. Concurrent basilar predominant airspace disease is suspicious for infection or aspiration. . Tx mag 2 g, douneb,cefepime,vanc,lasix40 mg x 1    Review of Systems: As per HPI otherwise 10 point review of systems negative.   Past Medical History:  Diagnosis Date   (HFpEF) heart failure with preserved ejection fraction ([place]) 02/24/2017   Echo 05/2020: EF 55, severe inferior HK, mild LVH, Gr1 DD, normal RVSF, RVSP 32.4, mild BAE, mild MR, AV sclerosis without stenosis   Abdominal pain, acute, epigastric 03/02/2012   Acute congestive heart failure ([place]) 02/24/2017   Anxiety and depression    Arthritis    BACK   Bipolar disorder ([place])    Blepharospasm    LEFT EYE   CAP (community acquired pneumonia) 03/01/2021   Chronic back pain  Closed fracture of head of left radius 06/29/2017   COPD (chronic obstructive pulmonary disease) (HCC)    Coronary artery disease    s/p Inf MI in 2000 tx with PCI to RCA // Myoview in 2019 with inf-lat scar and mild peri-infarct ischemia   Emphysema    GERD (gastroesophageal reflux disease)    History of alcohol abuse    RECOVERING SINCE 2000   History of Left renal mass 03/02/2012   underwent partial nephrectomy   HTN (hypertension)    Idiopathic acute facial nerve palsy    LEFT SIDE--  BOTOX THERAPY    Inferior MI ([place]) 2000   --  POST PTCA W/ STENT RCA   Ischemic cardiomyopathy    EF returned to normal   Osteoporosis    Other and unspecified general anesthetics causing adverse effect in therapeutic use    post op delirium--  last anes record w/ chart  from   09-22-2009 (spinal w/ light sedation)   Preoperative clearance 06/13/2012   Renal cell carcinoma ([place]) 07/09/2012   Left mass   Rosacea    LEFT FACIAL RASH   S/P radiation therapy 02/21/2012   38.75 Gy HDR 5 Fractions- vaginal cuff   Scoliosis    Seizures (HCC)    x 1 after abrupt discontinuation of  Clonidine   Status post carotid endarterectomy 1995   Right   Unstable balance    WALKS W/ CANE   Vaginal cancer ([place])     Past Surgical History:  Procedure Laterality Date   BREAST EXCISIONAL BIOPSY Left 2019   b9 axilla Bx X 3   CAROTID ENDARTERECTOMY  1995   RIGHT   CATARACT EXTRACTION W/ INTRAOCULAR LENS  IMPLANT, BILATERAL     CERVICAL CONIZATION W/BX  09/23/2008   CORONARY ANGIOPLASTY WITH STENT PLACEMENT N/A 2000   X1 STENT TO RCA -   INFERIOR MI   EUS N/A 03/05/2012   Procedure: FULL UPPER ENDOSCOPIC ULTRASOUND (EUS) RADIAL and EGD;  Surgeon: Milus Banister, MD;  Location: [facility];  Service: Endoscopy;  Laterality: N/A;  ercp scope first than eus scope   HEMIARTHROPLASTY HIP  12/26/2008   LEFT FEMORAL NECK FX   LEFT HEART CATH AND CORONARY ANGIOGRAPHY N/A 02/08/2021   Procedure: LEFT HEART CATH AND CORONARY ANGIOGRAPHY;  Surgeon: Martinique, Peter M, MD;  Location: [place] [facility];  Service: Cardiovascular;  Laterality: N/A;   ORIF HIP FRACTURE  02/13/2007   RIGHT FEMORAL NECK FX   ORIF RIGHT DISTAL RADIUS AND RIGHT PROXIMAL HUMEROUS NECK FX'S  10/10/2005   POSTERIOR CERVICAL LAMINECTOMY N/A 08/28/2020   Procedure: Laminectomy and Foraminotomy - Cervical Two-Three, Cervical Three-Four, with lateral mass fusion/ fixation;  Surgeon: Dawley, Theodoro Doing, DO;  Location: [place];  Service: Neurosurgery;  Laterality:  N/A;   RIGHT SHOULDER SURG.  2007   ROBOTIC ASSITED PARTIAL NEPHRECTOMY Left 07/09/2012   Procedure: ROBOTIC ASSITED PARTIAL NEPHRECTOMY;  Surgeon: Dutch Gray, MD;  Location: [facility];  Service: Urology;  Laterality: Left;   TOTAL HIP ARTHROPLASTY  04/15/2008   POST FAILED  RIGHT HIP ORIF FEMORAL FX   TOTAL KNEE ARTHROPLASTY  09/22/2009   RIGHT   UPPER RIGHT VAGINAL REGION  12/28/2011   BIOPSY: SQUAMOUS CELL CARCINOMA   VAGINAL HYSTERECTOMY  07/06/2009   Secondary to dysplasia     reports that she quit smoking about 11 years ago. Her smoking use included cigarettes. She has a 100.00 pack-year smoking history. She has never used smokeless tobacco. She  reports that she does not currently use alcohol. She reports that she does not use drugs.  Allergies  Allergen Reactions   Cephalexin Diarrhea    Other reaction(s): diarrhea   Doxycycline Diarrhea   Gabapentin Other (See Comments)    sleepiness   Morphine And Related Anxiety    Was confused when taking it    Family History  Problem Relation Age of Onset   Hypertension Mother    Hypertension Father    Cancer Maternal Grandfather        type unknown   Hypertension Sister     Prior to Admission medications   Medication Sig Start Date End Date Taking? Authorizing Provider  acetaminophen (TYLENOL) 325 MG tablet Take 2 tablets (650 mg total) by mouth every 4 (four) hours as needed for mild pain ((score 1 to 3) or temp > 100.5). 09/17/20   Angiulli, Lavon Paganini, PA-C  albuterol (PROVENTIL) (2.5 MG/3ML) 0.083% nebulizer solution INAHLE 3 ML'S ( 1 VIAL) VIA NEBULIZER EVERY 6 HOURS AS NEEDED FOR WHEEZING OR FOR SHORTNESS OF BREATH Patient taking differently: Take 2.5 mg by nebulization every 6 (six) hours as needed for shortness of breath or wheezing. 06/23/21   Icard, Octavio Graves, DO  albuterol (VENTOLIN HFA) 108 (90 Base) MCG/ACT inhaler Inhale 2 puffs into the lungs every 6 (six) hours as needed for wheezing. 09/17/20   Angiulli, Lavon Paganini, PA-C   amLODipine (NORVASC) 5 MG tablet TAKE ONE TABLET BY MOUTH DAILY Patient taking differently: Take 5 mg by mouth daily. 06/16/21   Martinique, Peter M, MD  arformoterol (BROVANA) 15 MCG/2ML NEBU Take 2 mLs (15 mcg total) by nebulization 2 (two) times daily. 06/25/21   Cobb, Karie Schwalbe, NP  ARIPiprazole (ABILIFY) 10 MG tablet Take 1 tablet (10 mg total) by mouth daily. 09/17/20   Angiulli, Lavon Paganini, PA-C  aspirin EC 81 MG tablet Take 81 mg by mouth every morning.    [provider]  bisoprolol (ZEBETA) 5 MG tablet TAKE 1/2 TABLET BY MOUTH DAILY Patient taking differently: Take 2.5 mg by mouth daily. 04/28/21   Martinique, Peter M, MD  budesonide (PULMICORT) 0.5 MG/2ML nebulizer solution Take 2 mLs (0.5 mg total) by nebulization in the morning and at bedtime. 06/25/21   Cobb, Karie Schwalbe, NP  celecoxib (CELEBREX) 200 MG capsule Take 400 mg by mouth daily.    [provider]  clopidogrel (PLAVIX) 75 MG tablet Take 1 tablet (75 mg total) by mouth daily. 03/08/21   Martinique, Peter M, MD  dexlansoprazole (DEXILANT) 60 MG capsule Take 1 capsule (60 mg total) by mouth daily. 09/17/20   Angiulli, Lavon Paganini, PA-C  furosemide (LASIX) 40 MG tablet Take 1 tablet (40 mg total) by mouth daily. 06/29/21   Nita Sells, MD  lamoTRIgine (LAMICTAL) 100 MG tablet Take 2 tablets (200 mg total) by mouth 2 (two) times daily. 09/17/20   Angiulli, Lavon Paganini, PA-C  levofloxacin (LEVAQUIN) 500 MG tablet Take 1 tablet (500 mg total) by mouth daily. 06/29/21   Nita Sells, MD  montelukast (SINGULAIR) 10 MG tablet Take 1 tablet (10 mg total) by mouth at bedtime. TAKE 1 TABLET BY MOUTH EVERYDAY AT BEDTIME Patient taking differently: Take 10 mg by mouth at bedtime. 05/13/21   Icard, Octavio Graves, DO  nitroGLYCERIN (NITROSTAT) 0.4 MG SL tablet Place 1 tablet (0.4 mg total) under the tongue every 5 (five) minutes as needed for chest pain. 05/25/21   Martinique, Peter M, MD  Oxycodone HCl 10 MG TABS Take  1 tablet (10 mg total) by  mouth every 4 (four) hours as needed for severe pain. 09/17/20   Angiulli, Lavon Paganini, PA-C  OXYGEN Inhale 2 L into the lungs continuous.    [provider]  Polyethyl Glycol-Propyl Glycol (SYSTANE) 0.4-0.3 % GEL ophthalmic gel Place 1 application into both eyes daily as needed (dry eyes).    [provider]  revefenacin (YUPELRI) 175 MCG/3ML nebulizer solution Take 3 mLs (175 mcg total) by nebulization daily. 06/25/21   Cobb, Karie Schwalbe, NP  roflumilast (DALIRESP) 500 MCG TABS tablet Take 0.5 tablets (250 mcg total) by mouth daily for 28 days, THEN 1 tablet (500 mcg total) daily. Patient not taking: Reported on 06/26/2021 04/07/21 11/01/21  Clayton Bibles, NP  roflumilast (DALIRESP) 500 MCG TABS tablet Take 500 mcg by mouth daily.    [provider]  sertraline (ZOLOFT) 100 MG tablet Take 150 mg by mouth daily. Take 150 mg per patient.    [provider]  tiZANidine (ZANAFLEX) 4 MG tablet Take 1 tablet (4 mg total) by mouth every 6 (six) hours. 09/17/20   Cathlyn Parsons, PA-C    Physical Exam: Vitals:   07/14/21 1445 07/14/21 1500 07/14/21 1530 07/14/21 1615  BP: 138/76 (!) 143/81 120/72 125/81  Pulse: 71 77 65 69  Resp: (!) '22 18 18 19  '$ Temp:      TempSrc:      SpO2: 96% 96% 99% 99%    Vitals:   07/14/21 1445 07/14/21 1500 07/14/21 1530 07/14/21 1615  BP: 138/76 (!) 143/81 120/72 125/81  Pulse: 71 77 65 69  Resp: (!) '22 18 18 19  '$ Temp:      TempSrc:      SpO2: 96% 96% 99% 99%   Constitutional: NAD, calm, no increase wob, tolerating bipap w/o difficulty Eyes: PERRL, lids and conjunctivae normal ENMT: on bipap deferred Neck: normal, supple, no masses, no thyromegaly Respiratory: very diminished, faint wheezing,  Normal respiratory effort. No accessory muscle use.  Cardiovascular: Regular rate and rhythm, no murmurs / rubs / gallops. No extremity edema. 2+ pedal pulses. No carotid bruits.  Abdomen: obese,no tenderness, no masses palpated. No  hepatosplenomegaly. Bowel sounds positive.  Musculoskeletal: no clubbing / cyanosis. No joint deformity upper and lower extremities. Good ROM, no contractures. Normal muscle tone.  Skin: multiple abrasions on b/l upper extremities,No induration Neurologic: CN 2-12 grossly intact. Sensation intact,  Strength 5/5 in all 4.  Psychiatric: Normal judgment and insight. Alert and oriented x 3. Normal mood.    Labs on Admission: I have personally reviewed following labs and imaging studies  CBC: Recent Labs  Lab 07/14/21 1232 07/14/21 1239 07/14/21 1240 07/14/21 1420  WBC 19.2*  --   --   --   NEUTROABS 13.0*  --   --   --   HGB 15.8* 17.0* 16.7* 14.3  HCT 45.5 50.0* 49.0* 42.0  MCV 102.5*  --   --   --   PLT 306  --   --   --    Basic Metabolic Panel: Recent Labs  Lab 07/14/21 1232 07/14/21 1239 07/14/21 1240 07/14/21 1420  NA 140 136 135 138  K 5.1 4.6 4.9 3.7  CL 104 107  --   --   CO2 15*  --   --   --   GLUCOSE 234* 231*  --   --   BUN 27* 33*  --   --   CREATININE 1.16* 1.00  --   --  CALCIUM 10.0  --   --   --    GFR: CrCl cannot be calculated (Unknown ideal weight.). Liver Function Tests: Recent Labs  Lab 07/14/21 1232  AST 43*  ALT 31  ALKPHOS 120  BILITOT 1.2  PROT 6.4*  ALBUMIN 3.3*   No results for input(s): "LIPASE", "AMYLASE" in the last 168 hours. No results for input(s): "AMMONIA" in the last 168 hours. Coagulation Profile: Recent Labs  Lab 07/14/21 1232  INR 0.9   Cardiac Enzymes: No results for input(s): "CKTOTAL", "CKMB", "CKMBINDEX", "TROPONINI" in the last 168 hours. BNP (last 3 results) No results for input(s): "PROBNP" in the last 8760 hours. HbA1C: No results for input(s): "HGBA1C" in the last 72 hours. CBG: No results for input(s): "GLUCAP" in the last 168 hours. Lipid Profile: No results for input(s): "CHOL", "HDL", "LDLCALC", "TRIG", "CHOLHDL", "LDLDIRECT" in the last 72 hours. Thyroid Function Tests: No results for input(s):  "TSH", "T4TOTAL", "FREET4", "T3FREE", "THYROIDAB" in the last 72 hours. Anemia Panel: No results for input(s): "VITAMINB12", "FOLATE", "FERRITIN", "TIBC", "IRON", "RETICCTPCT" in the last 72 hours. Urine analysis:    Component Value Date/Time   COLORURINE YELLOW 08/25/2020 2058   APPEARANCEUR CLEAR 08/25/2020 2058   LABSPEC 1.008 08/25/2020 2058   PHURINE 6.0 08/25/2020 2058   GLUCOSEU NEGATIVE 08/25/2020 2058   HGBUR NEGATIVE 08/25/2020 2058   BILIRUBINUR NEGATIVE 08/25/2020 2058   [place] 08/25/2020 2058   PROTEINUR NEGATIVE 08/25/2020 2058   UROBILINOGEN 0.2 03/07/2012 1234   NITRITE NEGATIVE 08/25/2020 2058   LEUKOCYTESUR TRACE (A) 08/25/2020 2058    Radiological Exams on Admission: DG Chest Portable 1 View  Result Date: 07/14/2021 CLINICAL DATA:  Status post cardiac arrest EXAM: PORTABLE CHEST 1 VIEW COMPARISON:  Plain film of 06/26/2021 and chest CT of 05/21/2021 FINDINGS: Right proximal femur fixation. Chin overlies the apices. Apical lordotic positioning. Moderate cardiomegaly. Atherosclerosis in the transverse aorta. No right and no large left pleural effusion. No pneumothorax. Significant worsening of interstitial prominence. Right perihilar and left-greater-than-right basilar and peripheral predominant airspace disease. IMPRESSION: Multfactorial degradation, as detailed above. Significantly worsened aeration, at least partially felt to be attributed to moderate congestive heart failure. Concurrent basilar predominant airspace disease is suspicious for infection or aspiration. Aortic Atherosclerosis (ICD10-I70.0). Electronically Signed   By: Abigail Miyamoto M.D.   On: 07/14/2021 13:10    EKG: Independently reviewed. See above   Assessment/Plan Respiratory Arrest  Acute hypoxic/hypercarbic respiratory failure requiring bipap Acute COPD exacerbation  Recurrent Pneumonia  -admit to progressive care  -continue on pneumonia protocol  -continue with vanc, azithromycin/  cefepime , de-escalate as able -continue bipap wean as able , repeat abg pending -DNI/DNR confirmed    Acute COPD exacerbation  -solumedrol with prednisone taper  -douneb standing and prn albuterol  -continue home inhaled steroids as able  -brief discussion with on call  pulmonary, no noted pulmonary consult service needs at this time  Severe CAD 3v disease nonoperable  Elevated CE -thought to be related to demand from arrest/cpr -ekg is unchanged patient is w/o cardiac chest pain  - will resume cardiac regimen as able  -bisoprolol 2.5 mg daily , plavix '75mg'$ daily , asa 81 daily   Hfref 40-45% -concern for possible mild exacerbation  - no overt fluid on exam  -s/p lasix 40 mgiv x  in ED -will start lasix 40 mg iv bid , transition back to oral as able  -strict I/o/ daily wt  Depression/Bipolar  continue Abilify 10 daily, sertraline 150 daily continue  Lamictal 200 twice daily  Chronic pain  -continue oxy IR    DVT prophylaxis: heparin  Code Status: DNI/DNR Family Communication:   Carlton,[name] (HCPOA) (Friend)  [phone] (Mobile) at beside Disposition Plan: patient  expected to be admitted greater than 2 midnights  Consults called: pulmonary  Admission status: progressive care   Clance Boll MD [facility]  If 7PM-7AM, please contact night-coverage [email or web address] Password [facility]  07/14/2021, 4:48 PM

## 2021-07-14 NOTE — Progress Notes (Signed)
Pt requested to have a break off Bipap. Pt placed on 5-6L [place] and within minutes her 02 dropped to low 80's. Pt placed back on Bipap for now. Will monitor

## 2021-07-14 NOTE — Progress Notes (Signed)
Pharmacy Antibiotic Note  [name] is a 76 y.o. female admitted on 07/14/2021 with pneumonia.  Pharmacy has been consulted for vancomycin dosing. Pt is afebrile and WBC is elevated at 19.2. SCr is 1.16 and lactic acid is elevated at 7.   Plan: Vancomycin 2g IV x 1 then '750mg'$  IV Q24H F/u renal fxn, C&S, clinical status and peak/trough at [facility] F/u continuation of gram neg coverage    Temp (24hrs), Avg:96.4 F (35.8 C), Min:96.4 F (35.8 C), Max:96.4 F (35.8 C)  Recent Labs  Lab 07/14/21 1232 07/14/21 1239  WBC 19.2*  --   CREATININE 1.16* 1.00  LATICACIDVEN 7.0*  --     CrCl cannot be calculated (Unknown ideal weight.).    Allergies  Allergen Reactions   Cephalexin Diarrhea    Other reaction(s): diarrhea   Doxycycline Diarrhea   Gabapentin Other (See Comments)    sleepiness   Morphine And Related Anxiety    Was confused when taking it    Antimicrobials this admission: Vanc 6/28>> Cefepime x 1 6/28  Dose adjustments this admission: N/A  Microbiology results: Pending  Thank you for allowing pharmacy to be a part of this patient's care.  Kanoelani Dobies, Rande Lawman 07/14/2021 1:42 PM

## 2021-07-14 NOTE — Progress Notes (Signed)
[facility]) Hospital Liaison Note  Met with patient and Myriam Forehand at bedside. Patient is alert and oriented X 4, currently on BiPap. Ms. Bigos shares that she called hospice for increased SOB, her condition continued to deteriorate quickly, 911 was called and CPR initiated when patient collapsed.  Pt reports pain in her back from CPR. Multiple skin tears noted on right arm and ecchymosis and swelling of left hand (which she denies is painful at this time).  Discussed goals of care with Ms. Willets and [place]. Ms. Gohlke shares she has been diagnosed with PNA. Discussed options for hospitalization with treatment vs a more comfort focused approach in our [place] or at home. Pt expresses she wishes to be hospitalized at this time and proceed with treatment for her pneumonia. She is very clear she does not want any further CPR or intubation.  [facility] hospital liaisons will continue to follow for discharge disposition and support throughout hospitalization.  Please call for any hospice related questions or concerns.  Thank you.  Margaretmary Eddy, BSN, RN [facility] Liaison [phone]

## 2021-07-14 NOTE — ED Triage Notes (Signed)
BIB [facility] from home after pt called to report increase SHOB. Per EMS, pt found to be talking on scene then collapsed in front of fire. Pt had x 2 rounds of CPR, Pulses reestablished. King airway placed, pt received albuterol, solumed, and midazolam en route. Upon arrival to hosital, pt refused intubation. Pt currently on Bipap. MD, RT at bedside. Pt DNR and followed by hospice.  HX: COPD.

## 2021-07-14 NOTE — Progress Notes (Signed)
Patient admitted to [phone]. BIPAP in place. Alert and oriented x4. Tele placed and running NSR. Skin tear noted to R arm; dressing changed. Ecchymosis noted to L arm. Purewick placed. Bed alarm on and audible. Patient instructed on use of call bell. Will continue to monitor.

## 2021-07-14 NOTE — Progress Notes (Signed)
[facility]) Hospital Liaison Note  This is a current hospice patient with [facility]. We will continue to follow while inpatient.   Please call with any questions or concerns. Thank you  Roselee Nova, [place] [facility] Liaison [phone]

## 2021-07-14 NOTE — ED Provider Notes (Signed)
[place] EMERGENCY DEPARTMENT Provider Note   CSN: [id number] Arrival date & time: 07/14/21  1226     History  Chief Complaint  Patient presents with   Respiratory Arrest    [name] is a 76 y.o. female.  Patient presents via EMS for respiratory arrest and CPR.  Fire department was called for shortness of breath.  Patient was found to have severe respiratory distress with respiratory arrest in front of fire department.  They performed CPR for approximately 2 minutes.  Unknown rhythm.  Did not receive any medications.  King airway placed by EMS.  EKG did not show any ST elevation.  Patient transported to the ED.  On arrival patient is awake and moving extremities.  She does have a DNR in place that EMS was not aware of.  She apparently is on hospice for her COPD.  On arrival, patient was awake with O2 saturations in the mid 80s.  She is breathing somewhat her own.  DNR paperwork is at bedside.  King airway was removed on arrival and patient is awake and able to follow commands.  She is transition to BiPAP. She indicates she does not want to be on life support and does not want to be intubated  The history is provided by the EMS personnel, the patient and a friend. The history is limited by the condition of the patient.       Home Medications Prior to Admission medications   Medication Sig Start Date End Date Taking? Authorizing Provider  acetaminophen (TYLENOL) 325 MG tablet Take 2 tablets (650 mg total) by mouth every 4 (four) hours as needed for mild pain ((score 1 to 3) or temp > 100.5). 09/17/20   Angiulli, Lavon Paganini, PA-C  albuterol (PROVENTIL) (2.5 MG/3ML) 0.083% nebulizer solution INAHLE 3 ML'S ( 1 VIAL) VIA NEBULIZER EVERY 6 HOURS AS NEEDED FOR WHEEZING OR FOR SHORTNESS OF BREATH Patient taking differently: Take 2.5 mg by nebulization every 6 (six) hours as needed for shortness of breath or wheezing. 06/23/21   Icard, Octavio Graves, DO  albuterol (VENTOLIN  HFA) 108 (90 Base) MCG/ACT inhaler Inhale 2 puffs into the lungs every 6 (six) hours as needed for wheezing. 09/17/20   Angiulli, Lavon Paganini, PA-C  amLODipine (NORVASC) 5 MG tablet TAKE ONE TABLET BY MOUTH DAILY Patient taking differently: Take 5 mg by mouth daily. 06/16/21   [place], Peter M, MD  arformoterol (BROVANA) 15 MCG/2ML NEBU Take 2 mLs (15 mcg total) by nebulization 2 (two) times daily. 06/25/21   Cobb, Karie Schwalbe, NP  ARIPiprazole (ABILIFY) 10 MG tablet Take 1 tablet (10 mg total) by mouth daily. 09/17/20   Angiulli, Lavon Paganini, PA-C  aspirin EC 81 MG tablet Take 81 mg by mouth every morning.    [provider]  bisoprolol (ZEBETA) 5 MG tablet TAKE 1/2 TABLET BY MOUTH DAILY Patient taking differently: Take 2.5 mg by mouth daily. 04/28/21   [place], Peter M, MD  budesonide (PULMICORT) 0.5 MG/2ML nebulizer solution Take 2 mLs (0.5 mg total) by nebulization in the morning and at bedtime. 06/25/21   Cobb, Karie Schwalbe, NP  celecoxib (CELEBREX) 200 MG capsule Take 400 mg by mouth daily.    [provider]  clopidogrel (PLAVIX) 75 MG tablet Take 1 tablet (75 mg total) by mouth daily. 03/08/21   [place], Peter M, MD  dexlansoprazole (DEXILANT) 60 MG capsule Take 1 capsule (60 mg total) by mouth daily. 09/17/20   Angiulli, Lavon Paganini, PA-C  furosemide (LASIX) 40 MG tablet Take 1 tablet (40 mg total) by mouth daily. 06/29/21   Nita Sells, MD  lamoTRIgine (LAMICTAL) 100 MG tablet Take 2 tablets (200 mg total) by mouth 2 (two) times daily. 09/17/20   Angiulli, Lavon Paganini, PA-C  levofloxacin (LEVAQUIN) 500 MG tablet Take 1 tablet (500 mg total) by mouth daily. 06/29/21   Nita Sells, MD  montelukast (SINGULAIR) 10 MG tablet Take 1 tablet (10 mg total) by mouth at bedtime. TAKE 1 TABLET BY MOUTH EVERYDAY AT BEDTIME Patient taking differently: Take 10 mg by mouth at bedtime. 05/13/21   Icard, Octavio Graves, DO  nitroGLYCERIN (NITROSTAT) 0.4 MG SL tablet Place 1 tablet (0.4 mg total) under the  tongue every 5 (five) minutes as needed for chest pain. 05/25/21   [place], Peter M, MD  Oxycodone HCl 10 MG TABS Take 1 tablet (10 mg total) by mouth every 4 (four) hours as needed for severe pain. 09/17/20   Angiulli, Lavon Paganini, PA-C  OXYGEN Inhale 2 L into the lungs continuous.    [provider]  Polyethyl Glycol-Propyl Glycol (SYSTANE) 0.4-0.3 % GEL ophthalmic gel Place 1 application into both eyes daily as needed (dry eyes).    [provider]  revefenacin (YUPELRI) 175 MCG/3ML nebulizer solution Take 3 mLs (175 mcg total) by nebulization daily. 06/25/21   Cobb, Karie Schwalbe, NP  roflumilast (DALIRESP) 500 MCG TABS tablet Take 0.5 tablets (250 mcg total) by mouth daily for 28 days, THEN 1 tablet (500 mcg total) daily. Patient not taking: Reported on 06/26/2021 04/07/21 11/01/21  Clayton Bibles, NP  roflumilast (DALIRESP) 500 MCG TABS tablet Take 500 mcg by mouth daily.    [provider]  sertraline (ZOLOFT) 100 MG tablet Take 150 mg by mouth daily. Take 150 mg per patient.    [provider]  tiZANidine (ZANAFLEX) 4 MG tablet Take 1 tablet (4 mg total) by mouth every 6 (six) hours. 09/17/20   Angiulli, Lavon Paganini, PA-C      Allergies    Cephalexin, Doxycycline, Gabapentin, and Morphine and related    Review of Systems   Review of Systems  Unable to perform ROS: Severe respiratory distress    Physical Exam Updated Vital Signs BP (!) 152/91   Pulse 77   Temp (!) 96.4 F (35.8 C) (Axillary)   Resp 19   SpO2 95%  Physical Exam Vitals and nursing note reviewed.  Constitutional:      General: She is in acute distress.     Appearance: She is well-developed. She is ill-appearing.     Comments: King airway in place, awake and following commands, breathing somewhat on own  HENT:     Head: Normocephalic and atraumatic.     Mouth/Throat:     Pharynx: No oropharyngeal exudate.  Eyes:     Conjunctiva/sclera: Conjunctivae normal.     Pupils: Pupils are equal,  round, and reactive to light.  Neck:     Comments: No meningismus. Cardiovascular:     Rate and Rhythm: Normal rate and regular rhythm.     Heart sounds: Normal heart sounds. No murmur heard. Pulmonary:     Effort: Respiratory distress present.     Breath sounds: Wheezing present.     Comments: Diminished breath sounds bilaterally with expiratory wheeze Abdominal:     Palpations: Abdomen is soft.     Tenderness: There is no abdominal tenderness. There is no guarding or rebound.  Musculoskeletal:  General: No tenderness. Normal range of motion.     Cervical back: Normal range of motion and neck supple.     Right lower leg: No edema.     Left lower leg: No edema.     Comments: Skin tear right arm  Skin:    General: Skin is warm.     Capillary Refill: Capillary refill takes less than 2 seconds.  Neurological:     General: No focal deficit present.     Mental Status: She is alert and oriented to person, place, and time.     Cranial Nerves: No cranial nerve deficit.     Motor: No abnormal muscle tone.     Coordination: Coordination normal.     Comments: Oriented to person and place.  Moves extremities and follow commands  Psychiatric:        Behavior: Behavior normal.     ED Results / Procedures / Treatments   Labs (all labs ordered are listed, but only abnormal results are displayed) Labs Reviewed  CBC WITH DIFFERENTIAL/PLATELET - Abnormal; Notable for the following components:      Result Value   WBC 19.2 (*)    Hemoglobin 15.8 (*)    MCV 102.5 (*)    MCH 35.6 (*)    Neutro Abs 13.0 (*)    Abs Immature Granulocytes 0.88 (*)    All other components within normal limits  COMPREHENSIVE METABOLIC PANEL - Abnormal; Notable for the following components:   CO2 15 (*)    Glucose, Bld 234 (*)    BUN 27 (*)    Creatinine, Ser 1.16 (*)    Total Protein 6.4 (*)    Albumin 3.3 (*)    AST 43 (*)    GFR, Estimated 49 (*)    Anion gap 21 (*)    All other components within  normal limits  LACTIC ACID, PLASMA - Abnormal; Notable for the following components:   Lactic Acid, Venous 7.0 (*)    All other components within normal limits  LACTIC ACID, PLASMA - Abnormal; Notable for the following components:   Lactic Acid, Venous 3.3 (*)    All other components within normal limits  I-STAT ARTERIAL BLOOD GAS, ED - Abnormal; Notable for the following components:   pH, Arterial 7.312 (*)    pCO2 arterial 57.9 (*)    pO2, Arterial 77 (*)    Bicarbonate 29.4 (*)    All other components within normal limits  I-STAT CHEM 8, ED - Abnormal; Notable for the following components:   BUN 33 (*)    Glucose, Bld 231 (*)    Calcium, Ion 1.13 (*)    Hemoglobin 17.0 (*)    HCT 50.0 (*)    All other components within normal limits  I-STAT VENOUS BLOOD GAS, ED - Abnormal; Notable for the following components:   pCO2, Ven 43.3 (*)    pO2, Ven 215 (*)    Acid-base deficit 3.0 (*)    Calcium, Ion 1.11 (*)    HCT 49.0 (*)    Hemoglobin 16.7 (*)    All other components within normal limits  TROPONIN I (HIGH SENSITIVITY) - Abnormal; Notable for the following components:   Troponin I (High Sensitivity) 35 (*)    All other components within normal limits  TROPONIN I (HIGH SENSITIVITY) - Abnormal; Notable for the following components:   Troponin I (High Sensitivity) 69 (*)    All other components within normal limits  CULTURE, BLOOD (ROUTINE X 2)  CULTURE, BLOOD (ROUTINE X 2)  CULTURE, BLOOD (ROUTINE X 2)  CULTURE, BLOOD (ROUTINE X 2)  EXPECTORATED SPUTUM ASSESSMENT W GRAM STAIN, RFLX TO RESP C  PROTIME-INR  BRAIN NATRIURETIC PEPTIDE  LEGIONELLA PNEUMOPHILA SEROGP 1 UR AG  STREP PNEUMONIAE URINARY ANTIGEN  CBC  CREATININE, SERUM  COMPREHENSIVE METABOLIC PANEL  CBC  BLOOD GAS, ARTERIAL    EKG EKG Interpretation  Date/Time:  Wednesday July 14 2021 12:42:32 EDT Ventricular Rate:  76 PR Interval:  181 QRS Duration: 104 QT Interval:  395 QTC Calculation: 445 R  Axis:   -45 Text Interpretation: Sinus rhythm LAD, consider left anterior fascicular block No significant change was found Confirmed by Ezequiel Essex [phone]) on 07/14/2021 12:48:32 PM  Radiology DG Chest Portable 1 View  Result Date: 07/14/2021 CLINICAL DATA:  Status post cardiac arrest EXAM: PORTABLE CHEST 1 VIEW COMPARISON:  Plain film of 06/26/2021 and chest CT of 05/21/2021 FINDINGS: Right proximal femur fixation. Chin overlies the apices. Apical lordotic positioning. Moderate cardiomegaly. Atherosclerosis in the transverse aorta. No right and no large left pleural effusion. No pneumothorax. Significant worsening of interstitial prominence. Right perihilar and left-greater-than-right basilar and peripheral predominant airspace disease. IMPRESSION: Multfactorial degradation, as detailed above. Significantly worsened aeration, at least partially felt to be attributed to moderate congestive heart failure. Concurrent basilar predominant airspace disease is suspicious for infection or aspiration. Aortic Atherosclerosis (ICD10-I70.0). Electronically Signed   By: Abigail Miyamoto M.D.   On: 07/14/2021 13:10    Procedures .Critical Care  Performed by: Ezequiel Essex, MD Authorized by: Ezequiel Essex, MD   Critical care provider statement:    Critical care time (minutes):  45   Critical care time was exclusive of:  Separately billable procedures and treating other patients   Critical care was necessary to treat or prevent imminent or life-threatening deterioration of the following conditions:  Respiratory failure   Critical care was time spent personally by me on the following activities:  Development of treatment plan with patient or surrogate, discussions with consultants, evaluation of patient's response to treatment, examination of patient, ordering and review of laboratory studies, ordering and review of radiographic studies, ordering and performing treatments and interventions, pulse oximetry,  re-evaluation of patient's condition and review of old charts   I assumed direction of critical care for this patient from another provider in my specialty: no     Care discussed with: admitting provider       Medications Ordered in ED Medications  magnesium sulfate IVPB 2 g 50 mL (2 g Intravenous New Bag/Given 07/14/21 1241)  magnesium sulfate IVPB 2 g 50 mL (2 g Intravenous New Bag/Given 07/14/21 1247)  albuterol (PROVENTIL,VENTOLIN) solution continuous neb (has no administration in time range)  ipratropium (ATROVENT) nebulizer solution 0.5 mg (has no administration in time range)    ED Course/ Medical Decision Making/ A&P                           Medical Decision Making Amount and/or Complexity of Data Reviewed Independent Historian: friend and EMS Labs: ordered. Decision-making details documented in ED Course. Radiology: ordered and independent interpretation performed. Decision-making details documented in ED Course. ECG/medicine tests: ordered and independent interpretation performed. Decision-making details documented in ED Course.  Risk Prescription drug management. Decision regarding hospitalization.  Patient presents as CPR status post respiratory arrest.  On arrival she is moving extremities and breathing on her own.  King airway was in place.  She has a DNR  at bedside that EMS was initially not aware of.   King airway was removed and the patient transitioned to BiPAP.  She is able to indicate she does not want to be intubated. She tolerated this well and is alert.   Per chart review she is on hospice for COPD and is on 2 L oxygen at home.  Oxygenation and mental status maintaining on BiPAP.  Patient will be given bronchodilators as well as steroids and magnesium.  Steroids were actually given by EMS.  EKG shows no acute ST elevation.  ABG without significant CO2 retention. Chest x-ray shows worsening interstitial airspace disease bilaterally.  Question aspiration  versus edema.  Results reviewed and interpreted by me.  Antibiotics initiated for possible aspiration. IV lasix given for component of pulmonary edema as well.   Mentation stable on bipap. Patient and friend indicate that she doesn't want intubation or CPR again.  Admission d/w Dr. Marcello Moores.         Final Clinical Impression(s) / ED Diagnoses Final diagnoses:  Respiratory arrest ([place])  COPD exacerbation ([place])    Rx / DC Orders ED Discharge Orders     None         Verner Kopischke, Annie Main, MD 07/14/21 1719

## 2021-07-15 ENCOUNTER — Encounter (HOSPITAL_COMMUNITY): Payer: Self-pay | Admitting: Internal Medicine

## 2021-07-15 DIAGNOSIS — I5020 Unspecified systolic (congestive) heart failure: Secondary | ICD-10-CM

## 2021-07-15 DIAGNOSIS — J441 Chronic obstructive pulmonary disease with (acute) exacerbation: Secondary | ICD-10-CM

## 2021-07-15 DIAGNOSIS — J9601 Acute respiratory failure with hypoxia: Secondary | ICD-10-CM | POA: Diagnosis not present

## 2021-07-15 DIAGNOSIS — J189 Pneumonia, unspecified organism: Secondary | ICD-10-CM | POA: Diagnosis not present

## 2021-07-15 DIAGNOSIS — I739 Peripheral vascular disease, unspecified: Secondary | ICD-10-CM

## 2021-07-15 DIAGNOSIS — J9602 Acute respiratory failure with hypercapnia: Secondary | ICD-10-CM | POA: Diagnosis not present

## 2021-07-15 DIAGNOSIS — E872 Acidosis, unspecified: Secondary | ICD-10-CM | POA: Diagnosis present

## 2021-07-15 LAB — COMPREHENSIVE METABOLIC PANEL
ALT: 37 U/L (ref 0–44)
AST: 44 U/L — ABNORMAL HIGH (ref 15–41)
Albumin: 3 g/dL — ABNORMAL LOW (ref 3.5–5.0)
Alkaline Phosphatase: 70 U/L (ref 38–126)
Anion gap: 9 (ref 5–15)
BUN: 28 mg/dL — ABNORMAL HIGH (ref 8–23)
CO2: 28 mmol/L (ref 22–32)
Calcium: 9.3 mg/dL (ref 8.9–10.3)
Chloride: 105 mmol/L (ref 98–111)
Creatinine, Ser: 1.03 mg/dL — ABNORMAL HIGH (ref 0.44–1.00)
GFR, Estimated: 56 mL/min — ABNORMAL LOW (ref >60)
Glucose, Bld: 100 mg/dL — ABNORMAL HIGH (ref 70–99)
Potassium: 3.7 mmol/L (ref 3.5–5.1)
Sodium: 142 mmol/L (ref 135–145)
Total Bilirubin: 0.8 mg/dL (ref 0.3–1.2)
Total Protein: 5.7 g/dL — ABNORMAL LOW (ref 6.5–8.1)

## 2021-07-15 LAB — CBC WITH DIFFERENTIAL/PLATELET
Abs Immature Granulocytes: 0.1 10*3/uL — ABNORMAL HIGH (ref 0.00–0.07)
Basophils Absolute: 0 10*3/uL (ref 0.0–0.1)
Basophils Relative: 0 %
Eosinophils Absolute: 0.1 10*3/uL (ref 0.0–0.5)
Eosinophils Relative: 1 %
HCT: 40.8 % (ref 36.0–46.0)
Hemoglobin: 13.7 g/dL (ref 12.0–15.0)
Immature Granulocytes: 1 %
Lymphocytes Relative: 9 %
Lymphs Abs: 1.1 10*3/uL (ref 0.7–4.0)
MCH: 31.7 pg (ref 26.0–34.0)
MCHC: 33.6 g/dL (ref 30.0–36.0)
MCV: 94.4 fL (ref 80.0–100.0)
Monocytes Absolute: 0.7 10*3/uL (ref 0.1–1.0)
Monocytes Relative: 5 %
Neutro Abs: 10.6 10*3/uL — ABNORMAL HIGH (ref 1.7–7.7)
Neutrophils Relative %: 84 %
Platelets: 204 10*3/uL (ref 150–400)
RBC: 4.32 MIL/uL (ref 3.87–5.11)
RDW: 13.7 % (ref 11.5–15.5)
WBC: 12.6 10*3/uL — ABNORMAL HIGH (ref 4.0–10.5)
nRBC: 0 % (ref 0.0–0.2)

## 2021-07-15 LAB — CBC
HCT: 38.5 % (ref 36.0–46.0)
Hemoglobin: 13.7 g/dL (ref 12.0–15.0)
MCH: 34.8 pg — ABNORMAL HIGH (ref 26.0–34.0)
MCHC: 35.6 g/dL (ref 30.0–36.0)
MCV: 97.7 fL (ref 80.0–100.0)
Platelets: 210 10*3/uL (ref 150–400)
RBC: 3.94 MIL/uL (ref 3.87–5.11)
RDW: 13.8 % (ref 11.5–15.5)
WBC: 16 10*3/uL — ABNORMAL HIGH (ref 4.0–10.5)
nRBC: 0 % (ref 0.0–0.2)

## 2021-07-15 LAB — LEGIONELLA PNEUMOPHILA SEROGP 1 UR AG: L. pneumophila Serogp 1 Ur Ag: NEGATIVE

## 2021-07-15 LAB — MRSA NEXT GEN BY PCR, NASAL: MRSA by PCR Next Gen: NOT DETECTED

## 2021-07-15 LAB — LACTIC ACID, PLASMA: Lactic Acid, Venous: 2.5 mmol/L (ref 0.5–1.9)

## 2021-07-15 MED ORDER — ARFORMOTEROL TARTRATE 15 MCG/2ML IN NEBU
15.0000 ug | INHALATION_SOLUTION | Freq: Two times a day (BID) | RESPIRATORY_TRACT | Status: DC
  Administered 2021-07-15 – 2021-07-17 (×5): 15 ug via RESPIRATORY_TRACT
  Filled 2021-07-15 (×5): qty 2

## 2021-07-15 MED ORDER — BISOPROLOL FUMARATE 5 MG PO TABS
2.5000 mg | ORAL_TABLET | Freq: Every day | ORAL | Status: DC
  Administered 2021-07-15 – 2021-07-17 (×3): 2.5 mg via ORAL
  Filled 2021-07-15 (×3): qty 0.5

## 2021-07-15 MED ORDER — ARFORMOTEROL TARTRATE 15 MCG/2ML IN NEBU
15.0000 ug | INHALATION_SOLUTION | Freq: Two times a day (BID) | RESPIRATORY_TRACT | Status: DC
Start: 2021-07-15 — End: 2021-07-15

## 2021-07-15 MED ORDER — AMLODIPINE BESYLATE 5 MG PO TABS
5.0000 mg | ORAL_TABLET | Freq: Every day | ORAL | Status: DC
  Administered 2021-07-15 – 2021-07-17 (×3): 5 mg via ORAL
  Filled 2021-07-15 (×3): qty 1

## 2021-07-15 MED ORDER — SODIUM CHLORIDE 0.9 % IV SOLN
2.0000 g | Freq: Two times a day (BID) | INTRAVENOUS | Status: DC
  Administered 2021-07-15 – 2021-07-17 (×4): 2 g via INTRAVENOUS
  Filled 2021-07-15 (×5): qty 12.5

## 2021-07-15 NOTE — Progress Notes (Addendum)
Progress Note    [name]  UUV:[id number] DOB: [birth year]  DOA: 07/14/2021 PCP: Harlan Stains, MD      Brief Narrative:    Medical records reviewed and are as summarized below:  [name] is a 76 y.o. female with medical history significant for COPD/emphysema, chronic hypoxic respiratory failure on 2 L of oxygen, CAD s/p PCI, chronic systolic CHF last EF 47-42%,VZD, HLD, bipolar d/o, renal cell carcinoma s/p nephrectomy, recent hospitalization from 06/26/2021-06/28/2021 for acute hypoxic respiratory failure secondary to aspiration pneumonia in background of severe emphysema.under admission, she was discharged home with hospice.    She presented to the hospital again on 07/14/2021 with increasing shortness of breath.  Reportedly, she collapsed at home.  CPR was performed and she was brought to the hospital for further management.  She declined intubation and mechanical ventilation and confirmed that she is DNR.  She was admitted to the hospital for acute hypoxic and hypercapnic respiratory failure that is multifactorial likely from pneumonia, COPD exacerbation and acute exacerbation of chronic systolic CHF.  She required BiPAP for respiratory failure.  She was treated with IV Lasix, steroids, bronchodilators and empiric IV antibiotics.         Assessment/Plan:   Principal Problem:   Acute respiratory failure (HCC) Active Problems:   Acute on chronic respiratory failure with hypoxia (HCC)   Heart failure with reduced ejection fraction (HCC)   COPD with acute exacerbation (HCC)   CAP (community acquired pneumonia)   Coronary artery disease   Peripheral vascular disease (HCC)   Lactic acidosis   Body mass index is 35.85 kg/m.  (Obesity)  S/p ?respiratory arrest, acute hypoxic and hypercapnic respiratory failure, chronic hypoxic respiratory failure on 2 L/min oxygen: She is off BiPAP and requiring 10 L/min oxygen via nasal cannula.  Use BiPAP as needed for  increased work of breathing or worsening respiratory failure.  She is DNI DNR.  Pneumonia: Continue empiric IV antibiotics.  Follow-up blood cultures.  Acute exacerbation of chronic systolic CHF: Continue IV Lasix for now.  COPD exacerbation: Continue IV steroids and bronchodilators.  Elevated troponins in the setting of severe CAD, elevated troponins likely due to demand ischemia.  Depression, bipolar disorder: Continue psychotropics  Chronic pain: Continue oxycodone as needed for pain.  Discussed goals of care with the patient.  She wants to be treated in the hospital but she would go back on hospice once she is discharged home.   Diet Order             Diet Heart Room service appropriate? Yes; Fluid consistency: Thin  Diet effective now                            Consultants: None  Procedures: None    Medications:    amLODipine  5 mg Oral Daily   arformoterol  15 mcg Nebulization BID   ARIPiprazole  10 mg Oral Daily   aspirin EC  81 mg Oral q morning   bisoprolol  2.5 mg Oral Daily   budesonide  0.5 mg Nebulization BID   celecoxib  400 mg Oral Daily   clopidogrel  75 mg Oral Daily   furosemide  40 mg Intravenous Q12H   heparin  5,000 Units Subcutaneous Q8H   lamoTRIgine  200 mg Oral BID   montelukast  10 mg Oral QHS   pantoprazole  40 mg Oral Daily   revefenacin  175 mcg  Nebulization Daily   roflumilast  500 mcg Oral Daily   sertraline  150 mg Oral Daily   Continuous Infusions:  azithromycin Stopped (07/14/21 1827)   ceFEPime (MAXIPIME) IV       Anti-infectives (From admission, onward)    Start     Dose/Rate Route Frequency Ordered Stop   07/15/21 2200  ceFEPIme (MAXIPIME) 2 g in sodium chloride 0.9 % 100 mL IVPB        2 g 200 mL/hr over 30 Minutes Intravenous Every 12 hours 07/15/21 1347 07/20/21 2159   07/15/21 1600  vancomycin (VANCOREADY) IVPB 750 mg/150 mL  Status:  Discontinued        750 mg 150 mL/hr over 60 Minutes Intravenous  Every 24 hours 07/14/21 1440 07/15/21 1323   07/14/21 2200  ceFEPIme (MAXIPIME) 2 g in sodium chloride 0.9 % 100 mL IVPB  Status:  Discontinued        2 g 200 mL/hr over 30 Minutes Intravenous Every 8 hours 07/14/21 1646 07/15/21 1347   07/14/21 1700  azithromycin (ZITHROMAX) 500 mg in sodium chloride 0.9 % 250 mL IVPB        500 mg 250 mL/hr over 60 Minutes Intravenous Every 24 hours 07/14/21 1646 07/19/21 1644   07/14/21 1345  vancomycin (VANCOREADY) IVPB 2000 mg/400 mL        2,000 mg 200 mL/hr over 120 Minutes Intravenous  Once 07/14/21 1342 07/14/21 1652   07/14/21 1330  ceFEPIme (MAXIPIME) 2 g in sodium chloride 0.9 % 100 mL IVPB        2 g 200 mL/hr over 30 Minutes Intravenous  Once 07/14/21 1320 07/14/21 1426              Family Communication/Anticipated D/C date and plan/Code Status   DVT prophylaxis: heparin injection 5,000 Units Start: 07/14/21 1700     Code Status: DNR  Family Communication: Plan discussed with his younger brother at the bedside Disposition Plan: Plan to discharge home with hospice in 2 to 3 days   Status is: Inpatient Remains inpatient appropriate because: Acute hypoxic respiratory failure, IV antibiotics       Subjective:   Interval events noted.  She complains of chest pain from chest compressions.  She feels short of breath but not as bad as it was.  Objective:    Vitals:   07/15/21 0804 07/15/21 0826 07/15/21 1103 07/15/21 1136  BP:    (!) 155/76  Pulse:  63  65  Resp:  18  (!) 23  Temp:    98.1 F (36.7 C)  TempSrc:    Axillary  SpO2: 98% 99%  96%  Weight:   91.8 kg   Height:   '5\' 3"'$  (1.6 m)    No data found.   Intake/Output Summary (Last 24 hours) at 07/15/2021 1454 Last data filed at 07/15/2021 0800 Gross per 24 hour  Intake 850.04 ml  Output 2900 ml  Net -2049.96 ml   Filed Weights   07/15/21 1103  Weight: 91.8 kg    Exam:  GEN: NAD SKIN: Bruises on upper extremities EYES: EOMI ENT: MMM CV:  RRR PULM: CTA B ABD: soft, ND, NT, +BS CNS: AAO x 3, non focal EXT: No edema or tenderness     Data Reviewed:   I have personally reviewed following labs and imaging studies:  Labs: Labs show the following:   Basic Metabolic Panel: Recent Labs  Lab 07/14/21 1232 07/14/21 1239 07/14/21 1240 07/14/21 1420 07/14/21 1726 07/15/21 7829  NA 140 136 135 138  --  142  K 5.1 4.6 4.9 3.7  --  3.7  CL 104 107  --   --   --  105  CO2 15*  --   --   --   --  28  GLUCOSE 234* 231*  --   --   --  100*  BUN 27* 33*  --   --   --  28*  CREATININE 1.16* 1.00  --   --  1.08* 1.03*  CALCIUM 10.0  --   --   --   --  9.3   GFR Estimated Creatinine Clearance: 50 mL/min (A) (by C-G formula based on SCr of 1.03 mg/dL (H)). Liver Function Tests: Recent Labs  Lab 07/14/21 1232 07/15/21 0213  AST 43* 44*  ALT 31 37  ALKPHOS 120 70  BILITOT 1.2 0.8  PROT 6.4* 5.7*  ALBUMIN 3.3* 3.0*   No results for input(s): "LIPASE", "AMYLASE" in the last 168 hours. No results for input(s): "AMMONIA" in the last 168 hours. Coagulation profile Recent Labs  Lab 07/14/21 1232  INR 0.9    CBC: Recent Labs  Lab 07/14/21 1232 07/14/21 1239 07/14/21 1240 07/14/21 1420 07/14/21 1726 07/15/21 0213  WBC 19.2*  --   --   --  18.3* 16.0*  NEUTROABS 13.0*  --   --   --   --   --   HGB 15.8* 17.0* 16.7* 14.3 13.8 13.7  HCT 45.5 50.0* 49.0* 42.0 40.3 38.5  MCV 102.5*  --   --   --  102.8* 97.7  PLT 306  --   --   --  214 210   Cardiac Enzymes: No results for input(s): "CKTOTAL", "CKMB", "CKMBINDEX", "TROPONINI" in the last 168 hours. BNP (last 3 results) No results for input(s): "PROBNP" in the last 8760 hours. CBG: No results for input(s): "GLUCAP" in the last 168 hours. D-Dimer: No results for input(s): "DDIMER" in the last 72 hours. Hgb A1c: No results for input(s): "HGBA1C" in the last 72 hours. Lipid Profile: No results for input(s): "CHOL", "HDL", "LDLCALC", "TRIG", "CHOLHDL",  "LDLDIRECT" in the last 72 hours. Thyroid function studies: No results for input(s): "TSH", "T4TOTAL", "T3FREE", "THYROIDAB" in the last 72 hours.  Invalid input(s): "FREET3" Anemia work up: No results for input(s): "VITAMINB12", "FOLATE", "FERRITIN", "TIBC", "IRON", "RETICCTPCT" in the last 72 hours. Sepsis Labs: Recent Labs  Lab 07/14/21 1232 07/14/21 1436 07/14/21 1726 07/14/21 1918 07/14/21 2310 07/15/21 0213  WBC 19.2*  --  18.3*  --   --  16.0*  LATICACIDVEN 7.0* 3.3*  --  3.1* 2.5*  --     Microbiology Recent Results (from the past 240 hour(s))  Blood culture (routine x 2)     Status: None (Preliminary result)   Collection Time: 07/14/21  1:33 PM   Specimen: BLOOD  Result Value Ref Range Status   Specimen Description BLOOD SITE NOT SPECIFIED  Final   Special Requests   Final    BOTTLES DRAWN AEROBIC AND ANAEROBIC Blood Culture results may not be optimal due to an inadequate volume of blood received in culture bottles   Culture   Final    NO GROWTH < 24 HOURS Performed at [facility], [place] [address], [place], [place] [address]    Report Status PENDING  Incomplete  Blood culture (routine x 2)     Status: None (Preliminary result)   Collection Time: 07/14/21  2:00 PM   Specimen:  BLOOD  Result Value Ref Range Status   Specimen Description BLOOD LEFT ANTECUBITAL  Final   Special Requests   Final    BOTTLES DRAWN AEROBIC AND ANAEROBIC Blood Culture adequate volume   Culture   Final    NO GROWTH < 24 HOURS Performed at [facility], [address] [place], [place] [address]    Report Status PENDING  Incomplete  MRSA Next Gen by PCR, Nasal     Status: None   Collection Time: 07/15/21  9:11 AM   Specimen: Nasal Mucosa; Nasal Swab  Result Value Ref Range Status   MRSA by PCR Next Gen NOT DETECTED NOT DETECTED Final    Comment: (NOTE) The GeneXpert MRSA Assay (FDA approved for NASAL specimens only), is one component of a comprehensive MRSA colonization  surveillance program. It is not intended to diagnose MRSA infection nor to guide or monitor treatment for MRSA infections. Test performance is not FDA approved in patients less than 87 years old. Performed at [facility], [place] [address] [place], [place] [address]     Procedures and diagnostic studies:  DG Chest Portable 1 View  Result Date: 07/14/2021 CLINICAL DATA:  Status post cardiac arrest EXAM: PORTABLE CHEST 1 VIEW COMPARISON:  Plain film of 06/26/2021 and chest CT of 05/21/2021 FINDINGS: Right proximal femur fixation. Chin overlies the apices. Apical lordotic positioning. Moderate cardiomegaly. Atherosclerosis in the transverse aorta. No right and no large left pleural effusion. No pneumothorax. Significant worsening of interstitial prominence. Right perihilar and left-greater-than-right basilar and peripheral predominant airspace disease. IMPRESSION: Multfactorial degradation, as detailed above. Significantly worsened aeration, at least partially felt to be attributed to moderate congestive heart failure. Concurrent basilar predominant airspace disease is suspicious for infection or aspiration. Aortic Atherosclerosis (ICD10-I70.0). Electronically Signed   By: Abigail Miyamoto M.D.   On: 07/14/2021 13:10               LOS: 1 day   [name]  [facility]   Pager on [email or web address]. If 7PM-7AM, please contact night-coverage at [email or web address]     07/15/2021, 2:54 PM

## 2021-07-15 NOTE — Progress Notes (Signed)
Patient doesn't want to wear BIPAP unless absolutely needed. Unit at bedside if she gets in distress or changes her mind.

## 2021-07-15 NOTE — Progress Notes (Signed)
  Transition of Care [facility]) Screening Note   Patient Details  Name: [name] Date of Birth: 04-11-1945   [facility]) CM/SW Contact:    Cyndi Bender, RN Phone Number: 07/15/2021, 8:41 AM    Transition of Care Department [facility]) has reviewed patient. Patient recently discharged home with [facility] hospice on 06/28/21. We will continue to monitor patient advancement through interdisciplinary progression rounds. If new patient transition needs arise, please place a TOC consult.

## 2021-07-15 NOTE — Progress Notes (Signed)
Pt taken off BiPAP and placed on 10L HFNC by RT. Pt tolerating well at this time no increased WOB noted, SpO2 98%,  BiPAP at bedside on standby. RN aware, RT will monitor.

## 2021-07-15 NOTE — Progress Notes (Signed)
[employer]ufacturing [employer] Hospitalized Hospice Patient RN Note.  [name] is a current Hospice patient with [facility]. She has a current CTI of CAD with recurrent NSTEMI and ischemic cardiomyopathy. She was at home at the time of this event in which caused acute SOB that led to CPR. She is being admitted for acute COPD exacerbation and recurrent PNA. This is a related admission.  I met patient and brother at bedside. [name] was sitting up in bed, she was alert and oriented. She did have some WOB at time of visit and stated she had some chest pain from where they performed CPR. She is still using BiPAP as needed. Currently on HFNC 10L. She is otherwise doing ok and hopes to get back home to her caregiver [facility] will continue to follow during hospitalization.   VS: 155/76, 65HR, 23RR, 96O2 HFNC 10L.  I&O:0/650 IV meds/PRN: Lasix $RemoveBeforeD'40mg'[id number]$  BID, Cefepime 2g TID, Fenanyl 40mcq Q2hr pRN 0540, Oxycodone $RemoveBeforeD'10mg'[id number]$  immediate release tablet 0930.   This is appropriate for GIP level of care in the setting of acute respiratory event and treatment of PNA.   Labs:  Latest Reference Range & Units 07/15/21 02:13  COMPREHENSIVE METABOLIC PANEL  Rpt !  Sodium 135 - 145 mmol/L 142  Potassium 3.5 - 5.1 mmol/L 3.7  Chloride 98 - 111 mmol/L 105  CO2 22 - 32 mmol/L 28  Glucose 70 - 99 mg/dL 100 (H)  BUN 8 - 23 mg/dL 28 (H)  Creatinine 0.44 - 1.00 mg/dL 1.03 (H)  Calcium 8.9 - 10.3 mg/dL 9.3  Anion gap 5 - 15  9  Alkaline Phosphatase 38 - 126 U/L 70  Albumin 3.5 - 5.0 g/dL 3.0 (L)  AST 15 - 41 U/L 44 (H)  ALT 0 - 44 U/L 37  Total Protein 6.5 - 8.1 g/dL 5.7 (L)  Total Bilirubin 0.3 - 1.2 mg/dL 0.8  GFR, Estimated >60 mL/min 56 (L)  WBC 4.0 - 10.5 K/uL 16.0 (H)  RBC 3.87 - 5.11 MIL/uL 3.94  Hemoglobin 12.0 - 15.0 g/dL 13.7  HCT 36.0 - 46.0 % 38.5  MCV 80.0 - 100.0 fL 97.7  MCH 26.0 - 34.0 pg 34.8 (H)  MCHC 30.0 - 36.0 g/dL 35.6  RDW 11.5 - 15.5 % 13.8  Platelets 150 - 400 K/uL 210  nRBC 0.0 - 0.2 % 0.0   !: Data is abnormal (H): Data is abnormally high (L): Data is abnormally low Rpt: View report in Results Review for more information  Problem list: Respiratory Arrest  Acute hypoxic/hypercarbic respiratory failure requiring bipap Acute COPD exacerbation  Recurrent Pneumonia  -admit to progressive care  -continue on pneumonia protocol  -continue with vanc, azithromycin/ cefepime , de-escalate as able -continue bipap wean as able , repeat abg pending -DNI/DNR confirmed      Acute COPD exacerbation  -solumedrol with prednisone taper  -douneb standing and prn albuterol  -continue home inhaled steroids as able  -brief discussion with on call  pulmonary, no noted pulmonary consult service needs at this time   Severe CAD 3v disease nonoperable  Elevated CE -thought to be related to demand from arrest/cpr -ekg is unchanged patient is w/o cardiac chest pain  - will resume cardiac regimen as able  -bisoprolol 2.5 mg daily , plavix $Remove'75mg'OMIkUMz$ daily , asa 81 daily    Hfref 40-45% -concern for possible mild exacerbation  - no overt fluid on exam  -s/p lasix 40 mgiv x  in ED -will start lasix 40 mg iv bid ,  transition back to oral as able  -strict I/o/ daily wt   Depression/Bipolar  continue Abilify 10 daily, sertraline 150 daily continue Lamictal 200 twice daily   Chronic pain  -continue oxy IR   Upon discharge please use [facility] for transport.   GOC: clear, DNR D/C planning: return home once medically stable with hospice  Family: at bedside, patient will update [name] IDT: updated    [name], [place], [facility] liaison [phone]

## 2021-07-16 ENCOUNTER — Inpatient Hospital Stay (HOSPITAL_COMMUNITY)

## 2021-07-16 DIAGNOSIS — I5020 Unspecified systolic (congestive) heart failure: Secondary | ICD-10-CM | POA: Diagnosis not present

## 2021-07-16 DIAGNOSIS — Z515 Encounter for palliative care: Secondary | ICD-10-CM

## 2021-07-16 DIAGNOSIS — R092 Respiratory arrest: Secondary | ICD-10-CM | POA: Diagnosis not present

## 2021-07-16 DIAGNOSIS — R52 Pain, unspecified: Secondary | ICD-10-CM

## 2021-07-16 DIAGNOSIS — E872 Acidosis, unspecified: Secondary | ICD-10-CM

## 2021-07-16 DIAGNOSIS — Z7189 Other specified counseling: Secondary | ICD-10-CM

## 2021-07-16 DIAGNOSIS — Z789 Other specified health status: Secondary | ICD-10-CM

## 2021-07-16 DIAGNOSIS — J9601 Acute respiratory failure with hypoxia: Secondary | ICD-10-CM | POA: Diagnosis not present

## 2021-07-16 DIAGNOSIS — J441 Chronic obstructive pulmonary disease with (acute) exacerbation: Secondary | ICD-10-CM | POA: Diagnosis not present

## 2021-07-16 DIAGNOSIS — E876 Hypokalemia: Secondary | ICD-10-CM | POA: Diagnosis not present

## 2021-07-16 DIAGNOSIS — J189 Pneumonia, unspecified organism: Secondary | ICD-10-CM | POA: Diagnosis not present

## 2021-07-16 DIAGNOSIS — Z66 Do not resuscitate: Secondary | ICD-10-CM

## 2021-07-16 DIAGNOSIS — R638 Other symptoms and signs concerning food and fluid intake: Secondary | ICD-10-CM

## 2021-07-16 LAB — BASIC METABOLIC PANEL WITH GFR
Anion gap: 7 (ref 5–15)
BUN: 22 mg/dL (ref 8–23)
CO2: 29 mmol/L (ref 22–32)
Calcium: 9.5 mg/dL (ref 8.9–10.3)
Chloride: 100 mmol/L (ref 98–111)
Creatinine, Ser: 0.89 mg/dL (ref 0.44–1.00)
GFR, Estimated: >60 mL/min
Glucose, Bld: 123 mg/dL — ABNORMAL HIGH (ref 70–99)
Potassium: 2.9 mmol/L — ABNORMAL LOW (ref 3.5–5.1)
Sodium: 136 mmol/L (ref 135–145)

## 2021-07-16 LAB — PHOSPHORUS: Phosphorus: 2.2 mg/dL — ABNORMAL LOW (ref 2.5–4.6)

## 2021-07-16 LAB — MAGNESIUM: Magnesium: 2.1 mg/dL (ref 1.7–2.4)

## 2021-07-16 MED ORDER — POLYVINYL ALCOHOL 1.4 % OP SOLN: 1.0000 [drp] | Freq: Four times a day (QID) | OPHTHALMIC | Status: DC | PRN

## 2021-07-16 MED ORDER — K PHOS MONO-SOD PHOS DI & MONO 155-852-130 MG PO TABS
250.0000 mg | ORAL_TABLET | Freq: Three times a day (TID) | ORAL | Status: DC
  Administered 2021-07-16: 250 mg via ORAL
  Filled 2021-07-16: qty 1

## 2021-07-16 MED ORDER — HYDROMORPHONE HCL 1 MG/ML IJ SOLN
0.5000 mg | INTRAMUSCULAR | Status: DC | PRN
  Administered 2021-07-16: 1 mg via INTRAVENOUS
  Administered 2021-07-16: 0.5 mg via INTRAVENOUS
  Administered 2021-07-17 (×2): 1 mg via INTRAVENOUS
  Filled 2021-07-16: qty 0.5
  Filled 2021-07-16 (×4): qty 1

## 2021-07-16 MED ORDER — LIDOCAINE 5 % EX PTCH
1.0000 | MEDICATED_PATCH | CUTANEOUS | Status: DC
  Administered 2021-07-16 – 2021-07-17 (×2): 1 via TRANSDERMAL
  Filled 2021-07-16 (×2): qty 1

## 2021-07-16 MED ORDER — GLYCOPYRROLATE 1 MG PO TABS: 1.0000 mg | ORAL_TABLET | ORAL | Status: DC | PRN

## 2021-07-16 MED ORDER — ACETAMINOPHEN 325 MG PO TABS
650.0000 mg | ORAL_TABLET | Freq: Four times a day (QID) | ORAL | Status: DC | PRN
  Filled 2021-07-16: qty 2

## 2021-07-16 MED ORDER — AZITHROMYCIN 500 MG PO TABS
500.0000 mg | ORAL_TABLET | Freq: Every day | ORAL | Status: DC
  Administered 2021-07-16 – 2021-07-17 (×2): 500 mg via ORAL
  Filled 2021-07-16 (×2): qty 1

## 2021-07-16 MED ORDER — GLYCOPYRROLATE 0.2 MG/ML IJ SOLN: 0.2000 mg | INTRAMUSCULAR | Status: DC | PRN

## 2021-07-16 MED ORDER — DIPHENHYDRAMINE HCL 50 MG/ML IJ SOLN: 12.5000 mg | INTRAMUSCULAR | Status: DC | PRN

## 2021-07-16 MED ORDER — HALOPERIDOL LACTATE 2 MG/ML PO CONC: 2.0000 mg | Freq: Four times a day (QID) | ORAL | Status: DC | PRN

## 2021-07-16 MED ORDER — LORAZEPAM 1 MG PO TABS: 1.0000 mg | ORAL_TABLET | ORAL | Status: DC | PRN

## 2021-07-16 MED ORDER — HALOPERIDOL LACTATE 5 MG/ML IJ SOLN: 2.0000 mg | Freq: Four times a day (QID) | INTRAMUSCULAR | Status: DC | PRN

## 2021-07-16 MED ORDER — BIOTENE DRY MOUTH MT LIQD
15.0000 mL | Freq: Two times a day (BID) | OROMUCOSAL | Status: DC
  Administered 2021-07-16 – 2021-07-17 (×2): 15 mL via TOPICAL

## 2021-07-16 MED ORDER — POTASSIUM CHLORIDE CRYS ER 20 MEQ PO TBCR
40.0000 meq | EXTENDED_RELEASE_TABLET | ORAL | Status: AC
  Administered 2021-07-16 (×2): 40 meq via ORAL
  Filled 2021-07-16 (×2): qty 2

## 2021-07-16 MED ORDER — ACETAMINOPHEN 650 MG RE SUPP: 650.0000 mg | Freq: Four times a day (QID) | RECTAL | Status: DC | PRN

## 2021-07-16 MED ORDER — LORAZEPAM 2 MG/ML IJ SOLN: 1.0000 mg | INTRAMUSCULAR | Status: DC | PRN

## 2021-07-16 MED ORDER — HALOPERIDOL 1 MG PO TABS: 2.0000 mg | ORAL_TABLET | Freq: Four times a day (QID) | ORAL | Status: DC | PRN

## 2021-07-16 MED ORDER — LORAZEPAM 2 MG/ML PO CONC: 1.0000 mg | ORAL | Status: DC | PRN

## 2021-07-16 NOTE — Care Management Important Message (Signed)
Important Message  Patient Details  Name: [name] MRN: [id number] Date of Birth: [birth year]   Medicare Important Message Given:  Yes     Orbie Pyo 07/16/2021, 4:22 PM

## 2021-07-16 NOTE — TOC Transition Note (Addendum)
Transition of Care [facility]) - CM/SW Discharge Note   Patient Details  Name: [name] MRN: [id number] Date of Birth: [birth year]  Transition of Care [facility]) CM/SW Contact:  Verdell Carmine, RN Phone Number: 07/16/2021, 4:29 PM   Clinical Narrative:     Hospice nad Palliative care met with patient and brother. Patient wishes to be on comfort care, would like [facility] if available. NP secure messaged team to include [facility] liaison.  Patient still wishes to receive antibiotics until she leaves.  Shanita from [employer] states she will be evaluated in the morning.  Final next level of care: [place] Barriers to Discharge: No Barriers Identified   Patient Goals and CMS Choice Patient states their goals for this hospitalization and ongoing recovery are:: Comfort care   Choice offered to / list presented to : Patient  Discharge Placement              Patient chooses bed at:  ([place])        Discharge Plan and Services                                     Social Determinants of Health (SDOH) Interventions     Readmission Risk Interventions     No data to display

## 2021-07-16 NOTE — Progress Notes (Addendum)
Progress Note    [name]  MAU:[id number] DOB: 1945-08-01  DOA: 07/14/2021 PCP: Harlan Stains, MD      Brief Narrative:    Medical records reviewed and are as summarized below:  [name] is a 76 y.o. female with medical history significant for COPD/emphysema, chronic hypoxic respiratory failure on 2 L of oxygen, CAD s/p PCI, chronic systolic CHF last EF 56-38%,LHT, HLD, bipolar d/o, renal cell carcinoma s/p nephrectomy, recent hospitalization from 06/26/2021-06/28/2021 for acute hypoxic respiratory failure secondary to aspiration pneumonia in background of severe emphysema.under admission, she was discharged home with hospice.    She presented to the hospital again on 07/14/2021 with increasing shortness of breath.  Reportedly, she collapsed at home.  CPR was performed and she was brought to the hospital for further management.  She declined intubation and mechanical ventilation and confirmed that she is DNR.  She was admitted to the hospital for acute hypoxic and hypercapnic respiratory failure that is multifactorial likely from pneumonia, COPD exacerbation and acute exacerbation of chronic systolic CHF.  She required BiPAP for respiratory failure.  She was treated with IV Lasix, steroids, bronchodilators and empiric IV antibiotics.         Assessment/Plan:   Principal Problem:   Acute respiratory failure (HCC) Active Problems:   Acute on chronic respiratory failure with hypoxia (HCC)   Heart failure with reduced ejection fraction (HCC)   COPD with acute exacerbation (HCC)   CAP (community acquired pneumonia)   Coronary artery disease   Peripheral vascular disease (HCC)   Lactic acidosis   Hypokalemia   Hypophosphatemia   Body mass index is 34.95 kg/m.  (Obesity)  S/p ?respiratory arrest, acute hypoxic and hypercapnic respiratory failure, chronic hypoxic respiratory failure on 2 L/min oxygen: She is off BiPAP.  Oxygen has been tapered down from 10  L/min to 6 L/min.  Continue to taper down oxygen as able.  Use BiPAP as needed.   Pneumonia: Repeat chest x-ray on 07/16/2021 showed improved patchy interstitial and bibasilar airspace opacities which could represent edema and/or infection.  Continue empiric IV antibiotics.  No growth on blood cultures thus far.    Acute exacerbation of chronic systolic CHF: Continue IV Lasix.  Monitor BMP, daily weight and urine output.  COPD exacerbation: Continue IV steroids and bronchodilators.  Hypokalemia and hypophosphatemia: Replete potassium and phosphorus and monitor levels.  Elevated troponins in the setting of severe CAD, elevated troponins likely due to demand ischemia.  Depression, bipolar disorder: Continue psychotropics  Chronic pain: Continue oxycodone as needed for pain.  Discussed goals of care with the patient.  She wants to be treated in the hospital but she would go back on hospice once she is discharged home. Consulted palliative care team.  Follow-up with hospice team.  ADDENDUM  Patient and his brother met with hospice and palliative care team.  She has opted for comfort measures.  Plan for discharge to hospice house pending bed availability.  She prefers to stay on current medications.    Diet Order             Diet regular Room service appropriate? Yes; Fluid consistency: Thin  Diet effective now                            Consultants: None  Procedures: None    Medications:    amLODipine  5 mg Oral Daily   arformoterol  15 mcg Nebulization  BID   ARIPiprazole  10 mg Oral Daily   aspirin EC  81 mg Oral q morning   azithromycin  500 mg Oral Daily   bisoprolol  2.5 mg Oral Daily   budesonide  0.5 mg Nebulization BID   celecoxib  400 mg Oral Daily   clopidogrel  75 mg Oral Daily   furosemide  40 mg Intravenous Q12H   heparin  5,000 Units Subcutaneous Q8H   lamoTRIgine  200 mg Oral BID   lidocaine  1 patch Transdermal Q24H   montelukast  10 mg  Oral QHS   pantoprazole  40 mg Oral Daily   phosphorus  250 mg Oral TID   revefenacin  175 mcg Nebulization Daily   roflumilast  500 mcg Oral Daily   sertraline  150 mg Oral Daily   Continuous Infusions:  ceFEPime (MAXIPIME) IV 2 g (07/16/21 0958)     Anti-infectives (From admission, onward)    Start     Dose/Rate Route Frequency Ordered Stop   07/16/21 1200  azithromycin (ZITHROMAX) tablet 500 mg        500 mg Oral Daily 07/16/21 1102 07/19/21 0959   07/15/21 2200  ceFEPIme (MAXIPIME) 2 g in sodium chloride 0.9 % 100 mL IVPB        2 g 200 mL/hr over 30 Minutes Intravenous Every 12 hours 07/15/21 1347 07/20/21 2159   07/15/21 1600  vancomycin (VANCOREADY) IVPB 750 mg/150 mL  Status:  Discontinued        750 mg 150 mL/hr over 60 Minutes Intravenous Every 24 hours 07/14/21 1440 07/15/21 1323   07/14/21 2200  ceFEPIme (MAXIPIME) 2 g in sodium chloride 0.9 % 100 mL IVPB  Status:  Discontinued        2 g 200 mL/hr over 30 Minutes Intravenous Every 8 hours 07/14/21 1646 07/15/21 1347   07/14/21 1700  azithromycin (ZITHROMAX) 500 mg in sodium chloride 0.9 % 250 mL IVPB  Status:  Discontinued        500 mg 250 mL/hr over 60 Minutes Intravenous Every 24 hours 07/14/21 1646 07/16/21 1101   07/14/21 1345  vancomycin (VANCOREADY) IVPB 2000 mg/400 mL        2,000 mg 200 mL/hr over 120 Minutes Intravenous  Once 07/14/21 1342 07/14/21 1652   07/14/21 1330  ceFEPIme (MAXIPIME) 2 g in sodium chloride 0.9 % 100 mL IVPB        2 g 200 mL/hr over 30 Minutes Intravenous  Once 07/14/21 1320 07/14/21 1426              Family Communication/Anticipated D/C date and plan/Code Status   DVT prophylaxis: heparin injection 5,000 Units Start: 07/14/21 1700     Code Status: DNR  Family Communication: Plan discussed with her older brother at the bedside Disposition Plan: Plan to discharge home with hospice in 2 to 3 days   Status is: Inpatient Remains inpatient appropriate because: Acute  hypoxic respiratory failure, IV antibiotics       Subjective:   She complains of chest pain from chest compression.  Breathing is a little better.  Her brother (older) was at the bedside.  Objective:    Vitals:   07/16/21 1000 07/16/21 1137 07/16/21 1139 07/16/21 1206  BP:  (!) 163/105    Pulse:      Resp:  20    Temp:  98.4 F (36.9 C)    TempSrc:  Oral    SpO2: 96%  93% 90%  Weight:  Height:       No data found.   Intake/Output Summary (Last 24 hours) at 07/16/2021 1445 Last data filed at 07/16/2021 1347 Gross per 24 hour  Intake 690 ml  Output 3350 ml  Net -2660 ml   Filed Weights   07/15/21 1103 07/16/21 0500  Weight: 91.8 kg 89.5 kg    Exam:  GEN: NAD SKIN: Warm and dry.  Bruises on upper extremities EYES: EOMI ENT: MMM CV: RRR PULM: Decreased air entry bilaterally, mild bibasilar rales, no wheezing ABD: soft, ND, NT, +BS CNS: AAO x 3, non focal EXT: No edema or tenderness MSK: Anterior chest wall tenderness    Data Reviewed:   I have personally reviewed following labs and imaging studies:  Labs: Labs show the following:   Basic Metabolic Panel: Recent Labs  Lab 07/14/21 1232 07/14/21 1239 07/14/21 1240 07/14/21 1420 07/14/21 1726 07/15/21 0213 07/16/21 0112  NA 140 136 135 138  --  142 136  K 5.1 4.6 4.9 3.7  --  3.7 2.9*  CL 104 107  --   --   --  105 100  CO2 15*  --   --   --   --  28 29  GLUCOSE 234* 231*  --   --   --  100* 123*  BUN 27* 33*  --   --   --  28* 22  CREATININE 1.16* 1.00  --   --  1.08* 1.03* 0.89  CALCIUM 10.0  --   --   --   --  9.3 9.5  MG  --   --   --   --   --   --  2.1  PHOS  --   --   --   --   --   --  2.2*   GFR Estimated Creatinine Clearance: 57 mL/min (by C-G formula based on SCr of 0.89 mg/dL). Liver Function Tests: Recent Labs  Lab 07/14/21 1232 07/15/21 0213  AST 43* 44*  ALT 31 37  ALKPHOS 120 70  BILITOT 1.2 0.8  PROT 6.4* 5.7*  ALBUMIN 3.3* 3.0*   No results for input(s):  "LIPASE", "AMYLASE" in the last 168 hours. No results for input(s): "AMMONIA" in the last 168 hours. Coagulation profile Recent Labs  Lab 07/14/21 1232  INR 0.9    CBC: Recent Labs  Lab 07/14/21 1232 07/14/21 1239 07/14/21 1240 07/14/21 1420 07/14/21 1726 07/15/21 0213 07/15/21 1528  WBC 19.2*  --   --   --  18.3* 16.0* 12.6*  NEUTROABS 13.0*  --   --   --   --   --  10.6*  HGB 15.8*   < > 16.7* 14.3 13.8 13.7 13.7  HCT 45.5   < > 49.0* 42.0 40.3 38.5 40.8  MCV 102.5*  --   --   --  102.8* 97.7 94.4  PLT 306  --   --   --  214 210 204   < > = values in this interval not displayed.   Cardiac Enzymes: No results for input(s): "CKTOTAL", "CKMB", "CKMBINDEX", "TROPONINI" in the last 168 hours. BNP (last 3 results) No results for input(s): "PROBNP" in the last 8760 hours. CBG: No results for input(s): "GLUCAP" in the last 168 hours. D-Dimer: No results for input(s): "DDIMER" in the last 72 hours. Hgb A1c: No results for input(s): "HGBA1C" in the last 72 hours. Lipid Profile: No results for input(s): "CHOL", "HDL", "LDLCALC", "TRIG", "CHOLHDL", "LDLDIRECT" in the last  72 hours. Thyroid function studies: No results for input(s): "TSH", "T4TOTAL", "T3FREE", "THYROIDAB" in the last 72 hours.  Invalid input(s): "FREET3" Anemia work up: No results for input(s): "VITAMINB12", "FOLATE", "FERRITIN", "TIBC", "IRON", "RETICCTPCT" in the last 72 hours. Sepsis Labs: Recent Labs  Lab 07/14/21 1232 07/14/21 1436 07/14/21 1726 07/14/21 1918 07/14/21 2310 07/15/21 0213 07/15/21 1528  WBC 19.2*  --  18.3*  --   --  16.0* 12.6*  LATICACIDVEN 7.0* 3.3*  --  3.1* 2.5*  --   --     Microbiology Recent Results (from the past 240 hour(s))  Blood culture (routine x 2)     Status: None (Preliminary result)   Collection Time: 07/14/21  1:33 PM   Specimen: BLOOD  Result Value Ref Range Status   Specimen Description BLOOD SITE NOT SPECIFIED  Final   Special Requests   Final     BOTTLES DRAWN AEROBIC AND ANAEROBIC Blood Culture results may not be optimal due to an inadequate volume of blood received in culture bottles   Culture   Final    NO GROWTH 2 DAYS Performed at [facility], [place] [address] [place], [place] [address]    Report Status PENDING  Incomplete  Blood culture (routine x 2)     Status: None (Preliminary result)   Collection Time: 07/14/21  2:00 PM   Specimen: BLOOD  Result Value Ref Range Status   Specimen Description BLOOD LEFT ANTECUBITAL  Final   Special Requests   Final    BOTTLES DRAWN AEROBIC AND ANAEROBIC Blood Culture adequate volume   Culture   Final    NO GROWTH 2 DAYS Performed at [facility], [place] [address], [place], [place] [address]    Report Status PENDING  Incomplete  MRSA Next Gen by PCR, Nasal     Status: None   Collection Time: 07/15/21  9:11 AM   Specimen: Nasal Mucosa; Nasal Swab  Result Value Ref Range Status   MRSA by PCR Next Gen NOT DETECTED NOT DETECTED Final    Comment: (NOTE) The GeneXpert MRSA Assay (FDA approved for NASAL specimens only), is one component of a comprehensive MRSA colonization surveillance program. It is not intended to diagnose MRSA infection nor to guide or monitor treatment for MRSA infections. Test performance is not FDA approved in patients less than 30 years old. Performed at [facility], [place] [address], [place], [place] [address]     Procedures and diagnostic studies:  DG CHEST PORT 1 VIEW  Result Date: 07/16/2021 CLINICAL DATA:  CHF.  Pneumonia. EXAM: PORTABLE CHEST 1 VIEW COMPARISON:  July 14, 2021. FINDINGS: Improved patchy interstitial and bibasilar predominant airspace opacities. No visible pneumothorax or definite pleural effusion. Cardiomediastinal silhouette is mildly enlarged. No acute osseous abnormality. IMPRESSION: Improved patchy interstitial and bibasilar predominant airspace opacities, which could represent edema and/or infection. Electronically Signed    By: Margaretha Sheffield M.D.   On: 07/16/2021 08:41               LOS: 2 days      [facility]   Pager on [email or web address]. If 7PM-7AM, please contact night-coverage at [email or web address]     07/16/2021, 2:45 PM

## 2021-07-16 NOTE — Progress Notes (Addendum)
[name] Patient RN Note.   [name] is a current Hospice patient with [facility]. She has a current CTI of CAD with recurrent NSTEMI and ischemic cardiomyopathy. She was at home at the time of this event in which caused acute SOB that led to CPR. She is being admitted for acute COPD exacerbation and recurrent PNA. This is a related admission.   I met patient and brother at bedside. [name] was sitting up in bed, she was alert and oriented. Breathing was better today. She did state that she was uncomfortable and had a lot of pain. She is ready to be made full comfort care but wants to talk over it with her caregiver [name] first. She does not want to continue on like this. She is also refusing Bipap at this time, and they are currently trying to wean her O2 down. She is no on 8L HFNC. [facility] will continue to follow during hospitalization.    VS: 163/105, 65HR, 20RR, 96O2 HFNC 8L.  I&O:0/800 IV meds/PRN: Lasix $RemoveBeforeD'40mg'[id number]$  BID, Cefepime 2g TID, Fenanyl 23mcq Q2hr pRN [id number], 1001, 1211, Oxycodone $RemoveBeforeD'10mg'[id number]$  immediate release tablet 0656, 1113   This is appropriate for GIP level of care in the setting of acute respiratory event and treatment of PNA.    Labs:  Latest Reference Range & Units 07/16/21 82:95  BASIC METABOLIC PANEL  Rpt !  Sodium 135 - 145 mmol/L 136  Potassium 3.5 - 5.1 mmol/L 2.9 (L)  Chloride 98 - 111 mmol/L 100  CO2 22 - 32 mmol/L 29  Glucose 70 - 99 mg/dL 123 (H)  BUN 8 - 23 mg/dL 22  Creatinine 0.44 - 1.00 mg/dL 0.89  Calcium 8.9 - 10.3 mg/dL 9.5  Anion gap 5 - 15  7  Phosphorus 2.5 - 4.6 mg/dL 2.2 (L)  Magnesium 1.7 - 2.4 mg/dL 2.1  GFR, Estimated >60 mL/min >60       Problem list: Respiratory Arrest  Acute hypoxic/hypercarbic respiratory failure requiring bipap Acute COPD exacerbation  Recurrent Pneumonia  -admit to progressive care  -continue on pneumonia protocol  -continue with vanc, azithromycin/ cefepime , de-escalate as  able -continue bipap wean as able , repeat abg pending -DNI/DNR confirmed      Acute COPD exacerbation  -solumedrol with prednisone taper  -douneb standing and prn albuterol  -continue home inhaled steroids as able  -brief discussion with on call  pulmonary, no noted pulmonary consult service needs at this time   Severe CAD 3v disease nonoperable  Elevated CE -thought to be related to demand from arrest/cpr -ekg is unchanged patient is w/o cardiac chest pain  - will resume cardiac regimen as able  -bisoprolol 2.5 mg daily , plavix $Remove'75mg'[id number]$ daily , asa 81 daily    Hfref 40-45% -concern for possible mild exacerbation  - no overt fluid on exam  -s/p lasix 40 mgiv x  in ED -will start lasix 40 mg iv bid , transition back to oral as able  -strict I/o/ daily wt   Depression/Bipolar  continue Abilify 10 daily, sertraline 150 daily continue Lamictal 200 twice daily   Chronic pain  -continue oxy IR   Upon discharge please use [facility] for transport.    GOC: clear, DNR D/C planning: return home with hospice vs [facility]. Family: at bedside, patient will update [name] IDT: updated     [name], [name], [facility] liaison [phone]

## 2021-07-16 NOTE — Progress Notes (Signed)
Complaints of pain at CPR location . PRN medications not helping. Requested lidocaine patch

## 2021-07-16 NOTE — Consult Note (Cosign Needed Addendum)
Consultation Note Date: 07/16/2021   Patient Name: [name]  DOB: 1945/09/15  MRN: [id number]  Age / Sex: 76 y.o., female  PCP: Harlan Stains, MD Referring Physician: Jennye Boroughs, MD  Reason for Consultation: Establishing goals of care  HPI/Patient Profile: 76 y.o. female  with past medical history of COPD/emphysema on 2 L of oxygen, CAD s/p PCI, systolic CHF last EF 31-43%,OOI, HLD, bipolar d/o and RCC s/p nephrectomy presented to the ED on 07/14/21 from home after complaints of shortness of breath. She was recently hospitalized from 06/26/2021-06/28/2021 for acute hypoxic respiratory failure secondary to aspiration pneumonia in background of severe emphysema - she was seen by PMT and discharged with [facility] home hospice. Family report patient's shortness of breath was acute and fire department was called. On FD arrival patient was found to have severe respiratory distress with respiratory arrest - CPR was performed for approximately 2 minutes. After [place] discussion with hospice staff, patient opted for treating the treatable since already at the hospital but was clear for DNR/DNI. Patient admitted on 07/14/2021 with s/p respiratory arrest, acute hypoxic/hypercapnic respiratory failure requiring bipap, acute COPD exacerbation, recurrent pneumonia.    Clinical Assessment and Goals of Care: I have reviewed medical records including EPIC notes, labs, and imaging. Received report from primary RN - acute concern of patient's unmanaged pain. RN reports patient has minimal oral intake.    Went to visit patient at bedside - no family/visitors present. Upon my arrival, patient grabbed my hand and states "I want to go to [facility]." Validated we would discuss goals and discharge options. Patient was lying in bed awake, alert, oriented, and able to participate in conversation. Signs/non-verbal gestures of  pain/discomfort noted. No respiratory distress, increased work of breathing, or secretions noted. She is on 6L HFNC. Patient endorses 9/10 pain in her chest, back, neck from CPR. Current pain medication is not managing her pain adequately - currently on fentanyl 73mcg and oxy $Remov'10mg'FuoLhm$ .   Met with patient  to discuss diagnosis, prognosis, GOC, EOL wishes, disposition, and options.  I re-introduced Palliative Medicine as specialized medical care for people living with serious illness. It focuses on providing relief from the symptoms and stress of a serious illness. The goal is to improve quality of life for both the patient and the family.  We her interval history since last admission. Her caregiver at home is no longer able to care for her - they have had a hard time. Patient was feeling ok until acute event several days ago leading to CPR. Patient's appetite has been poor - she reports minimal oral intake.   We discussed patient's current illness and what it means in the larger context of patient's on-going co-morbidities.  Natural disease trajectory and expectations at EOL were discussed. I attempted to elicit values and goals of care important to the patient. The difference between aggressive medical intervention and comfort care was considered in light of the patient's goals of care. Patient states she is "scared but ok with dying." She would not want escalation of care in case of a decline in house. We talked about transition to comfort measures in house and what that would entail inclusive of medications to control pain, dyspnea, agitation, nausea, and itching. We discussed stopping all unnecessary measures such as blood draws, needle sticks, oxygen, antibiotics, CBGs/insulin, cardiac monitoring, IVF, and frequent vital signs. She tells me she wants her pneumonia to be treated to help her breath better. Discussed initiating full comfort but continuing antibiotics  for comfort until she discharges possibly to  [facility] - she is agreeable to this and is appreciative.   Patient is clear her goal is for discharge to [facility]. Education provided on evaluation process.   Visit also consisted of discussions dealing with the complex and emotionally intense issues of symptom management and palliative care in the setting of serious and potentially life-threatening illness.   Discussed with patient the importance of continued conversation with family and the medical providers regarding overall plan of care and treatment options, ensuring decisions are within the context of the patient's values and GOCs.    Questions and concerns were addressed. The patient/family was encouraged to call with questions and/or concerns. PMT card was provided.   Primary Decision Maker: PATIENT    SUMMARY OF RECOMMENDATIONS   Initiated full comfort measures Continue DNR/DNI as previously documented Patient requesting [place] transfer - TOC consult placed; TOC and hospice liaison notified Continue antibiotics until discharge/end of recommended course Wean oxygen as tolerated Added orders for EOL symptom management and to reflect full comfort measures, as well as discontinued orders that were not focused on comfort Unrestricted visitation orders were placed per current [place] EOL visitation policy  Nursing to provide frequent assessments and administer PRN medications as clinically necessary to ensure EOL comfort PMT will continue to follow peripherally. If there are any imminent needs please call the service directly  Symptom Management Dilaudid PRN pain/distress/dyspnea/increased work of breathing/RR>25 Continue Lidocaine patch Continue oxycodone PRN pain Tylenol PRN pain/fever Biotin twice daily Benadryl PRN itching Robinul PRN secretions Haldol PRN agitation/delirium Ativan PRN anxiety/seizure/sleep/distress Zofran PRN nausea/vomiting Liquifilm Tears PRN dry eye Continue antibiotics and  lasix for comfort - may consider decreasing/stopping lasix over the next 1-2 days pending oral intake/symptoms Continue home medications while tolerating POs   Code Status/Advance Care Planning: DNR  Palliative Prophylaxis:  Aspiration, Bowel Regimen, Delirium Protocol, Frequent Pain Assessment, Oral Care, and Turn Reposition  Additional Recommendations (Limitations, Scope, Preferences): Full Comfort Care  Psycho-social/Spiritual:  Desire for further Chaplaincy support:no Created space and opportunity for patient to express thoughts and feelings regarding patient's current medical situation.  Emotional support and therapeutic listening provided.  Prognosis:  Poor in the setting of advanced age, recurrent hospitalizations for CHF/COPD exacerbations, and multiple comorbidities Less than 2 weeks would not be surprising with her fragility/quick decline  Discharge Planning: Hospice facility      Primary Diagnoses: Present on Admission:  Acute respiratory failure ([place])  Heart failure with reduced ejection fraction (HCC)  COPD with acute exacerbation ([place])  CAP (community acquired pneumonia)  Coronary artery disease  Peripheral vascular disease ([place])  Acute on chronic respiratory failure with hypoxia (HCC)  Lactic acidosis   I have reviewed the medical record, interviewed the patient and family, and examined the patient. The following aspects are pertinent.  Past Medical History:  Diagnosis Date   (HFpEF) heart failure with preserved ejection fraction ([place]) 02/24/2017   Echo 05/2020: EF 55, severe inferior HK, mild LVH, Gr1 DD, normal RVSF, RVSP 32.4, mild BAE, mild MR, AV sclerosis without stenosis   Abdominal pain, acute, epigastric 03/02/2012   Acute congestive heart failure ([place]) 02/24/2017   Anxiety and depression    Arthritis    BACK   Bipolar disorder ([place])    Blepharospasm    LEFT EYE   CAP (community acquired pneumonia) 03/01/2021   Chronic back pain    Closed  fracture of head of left radius 06/29/2017   COPD (chronic  obstructive pulmonary disease) ([place])    Coronary artery disease    s/p Inf MI in 2000 tx with PCI to RCA // Myoview in 2019 with inf-lat scar and mild peri-infarct ischemia   Emphysema    GERD (gastroesophageal reflux disease)    History of alcohol abuse    RECOVERING SINCE 2000   History of Left renal mass 03/02/2012   underwent partial nephrectomy   HTN (hypertension)    Idiopathic acute facial nerve palsy    LEFT SIDE--  BOTOX THERAPY   Inferior MI ([place]) 2000   --  POST PTCA W/ STENT RCA   Ischemic cardiomyopathy    EF returned to normal   Osteoporosis    Other and unspecified general anesthetics causing adverse effect in therapeutic use    post op delirium--  last anes record w/ chart  from   09-22-2009 (spinal w/ light sedation)   Preoperative clearance 06/13/2012   Renal cell carcinoma ([place]) 07/09/2012   Left mass   Rosacea    LEFT FACIAL RASH   S/P radiation therapy 02/21/2012   38.75 Gy HDR 5 Fractions- vaginal cuff   Scoliosis    Seizures (HCC)    x 1 after abrupt discontinuation of  Clonidine   Status post carotid endarterectomy 1995   Right   Unstable balance    WALKS W/ CANE   Vaginal cancer ([place])    Social History   Socioeconomic History   Marital status: Divorced    Spouse name: Not on file   Number of children: 0   Years of education: Not on file   Highest education level: Not on file  Occupational History   Occupation: retired  Tobacco Use   Smoking status: Former    Packs/day: 2.00    Years: 50.00    Total pack years: 100.00    Types: Cigarettes    Quit date: 01/17/2010    Years since quitting: 11.5   Smokeless tobacco: Never   Tobacco comments:    STATES QUIT SMOKING 01-17-2010  Vaping Use   Vaping Use: Never used  Substance and Sexual Activity   Alcohol use: Not Currently    Comment: RECOVERING ALCOHOLIC--   QUIT IN 4599   Drug use: No   Sexual activity: Never  Other Topics  Concern   Not on file  Social History Narrative   Not on file   Social Determinants of Health   Financial Resource Strain: Not on file  Food Insecurity: Not on file  Transportation Needs: Not on file  Physical Activity: Not on file  Stress: Not on file  Social Connections: Not on file   Family History  Problem Relation Age of Onset   Hypertension Mother    Hypertension Father    Cancer Maternal Grandfather        type unknown   Hypertension Sister    Scheduled Meds:  amLODipine  5 mg Oral Daily   antiseptic oral rinse  15 mL Topical BID   arformoterol  15 mcg Nebulization BID   ARIPiprazole  10 mg Oral Daily   aspirin EC  81 mg Oral q morning   azithromycin  500 mg Oral Daily   bisoprolol  2.5 mg Oral Daily   budesonide  0.5 mg Nebulization BID   celecoxib  400 mg Oral Daily   clopidogrel  75 mg Oral Daily   furosemide  40 mg Intravenous Q12H   lamoTRIgine  200 mg Oral BID   lidocaine  1 patch Transdermal  Q24H   montelukast  10 mg Oral QHS   revefenacin  175 mcg Nebulization Daily   roflumilast  500 mcg Oral Daily   sertraline  150 mg Oral Daily   Continuous Infusions:  ceFEPime (MAXIPIME) IV 2 g (07/16/21 0958)   PRN Meds:.acetaminophen **OR** acetaminophen, diphenhydrAMINE, glycopyrrolate **OR** glycopyrrolate **OR** glycopyrrolate, haloperidol **OR** haloperidol **OR** haloperidol lactate, HYDROmorphone (DILAUDID) injection, LORazepam **OR** LORazepam **OR** LORazepam, ondansetron **OR** ondansetron (ZOFRAN) IV, oxyCODONE, polyvinyl alcohol Medications Prior to Admission:  Prior to Admission medications   Medication Sig Start Date End Date Taking? Authorizing Provider  acetaminophen (TYLENOL) 325 MG tablet Take 2 tablets (650 mg total) by mouth every 4 (four) hours as needed for mild pain ((score 1 to 3) or temp > 100.5). 09/17/20  Yes Angiulli, Lavon Paganini, PA-C  albuterol (PROVENTIL) (2.5 MG/3ML) 0.083% nebulizer solution INAHLE 3 ML'S ( 1 VIAL) VIA NEBULIZER EVERY 6  HOURS AS NEEDED FOR WHEEZING OR FOR SHORTNESS OF BREATH Patient taking differently: Take 2.5 mg by nebulization every 6 (six) hours as needed for shortness of breath or wheezing. 06/23/21  Yes Icard, Bradley L, DO  albuterol (VENTOLIN HFA) 108 (90 Base) MCG/ACT inhaler Inhale 2 puffs into the lungs every 6 (six) hours as needed for wheezing. 09/17/20  Yes Angiulli, Lavon Paganini, PA-C  amLODipine (NORVASC) 5 MG tablet TAKE ONE TABLET BY MOUTH DAILY Patient taking differently: Take 5 mg by mouth daily. 06/16/21  Yes [place]ique, Peter M, MD  arformoterol (BROVANA) 15 MCG/2ML NEBU Take 2 mLs (15 mcg total) by nebulization 2 (two) times daily. 06/25/21  Yes Cobb, Karie Schwalbe, NP  ARIPiprazole (ABILIFY) 10 MG tablet Take 1 tablet (10 mg total) by mouth daily. 09/17/20  Yes Angiulli, Lavon Paganini, PA-C  aspirin EC 81 MG tablet Take 81 mg by mouth every morning.   Yes [provider]  bisoprolol (ZEBETA) 5 MG tablet TAKE 1/2 TABLET BY MOUTH DAILY Patient taking differently: Take 2.5 mg by mouth daily. 04/28/21  Yes [place]ique, Peter M, MD  budesonide (PULMICORT) 0.5 MG/2ML nebulizer solution Take 2 mLs (0.5 mg total) by nebulization in the morning and at bedtime. 06/25/21  Yes Cobb, Karie Schwalbe, NP  celecoxib (CELEBREX) 200 MG capsule Take 400 mg by mouth daily.   Yes [provider]  clopidogrel (PLAVIX) 75 MG tablet Take 1 tablet (75 mg total) by mouth daily. 03/08/21  Yes [place]ique, Peter M, MD  dexlansoprazole (DEXILANT) 60 MG capsule Take 1 capsule (60 mg total) by mouth daily. 09/17/20  Yes Angiulli, Lavon Paganini, PA-C  furosemide (LASIX) 40 MG tablet Take 1 tablet (40 mg total) by mouth daily. 06/29/21  Yes Nita Sells, MD  lamoTRIgine (LAMICTAL) 100 MG tablet Take 2 tablets (200 mg total) by mouth 2 (two) times daily. 09/17/20  Yes Angiulli, Lavon Paganini, PA-C  montelukast (SINGULAIR) 10 MG tablet Take 1 tablet (10 mg total) by mouth at bedtime. TAKE 1 TABLET BY MOUTH EVERYDAY AT BEDTIME Patient taking differently:  Take 10 mg by mouth at bedtime. 05/13/21  Yes Icard, Octavio Graves, DO  nitroGLYCERIN (NITROSTAT) 0.4 MG SL tablet Place 1 tablet (0.4 mg total) under the tongue every 5 (five) minutes as needed for chest pain. 05/25/21  Yes [place], Peter M, MD  Oxycodone HCl 10 MG TABS Take 1 tablet (10 mg total) by mouth every 4 (four) hours as needed for severe pain. 09/17/20  Yes Angiulli, Lavon Paganini, PA-C  Polyethyl Glycol-Propyl Glycol (SYSTANE) 0.4-0.3 % GEL ophthalmic gel Place 1 application into both  eyes daily as needed (dry eyes).   Yes [provider]  revefenacin (YUPELRI) 175 MCG/3ML nebulizer solution Take 3 mLs (175 mcg total) by nebulization daily. 06/25/21  Yes Cobb, Karie Schwalbe, NP  roflumilast (DALIRESP) 500 MCG TABS tablet Take 500 mcg by mouth daily.   Yes [provider]  sertraline (ZOLOFT) 100 MG tablet Take 150 mg by mouth daily.   Yes [provider]  tiZANidine (ZANAFLEX) 4 MG tablet Take 1 tablet (4 mg total) by mouth every 6 (six) hours. 09/17/20  Yes Angiulli, Lavon Paganini, PA-C  levofloxacin (LEVAQUIN) 500 MG tablet Take 1 tablet (500 mg total) by mouth daily. Patient not taking: Reported on 07/15/2021 06/29/21   Nita Sells, MD  OXYGEN Inhale 2 L into the lungs continuous.    [provider]  roflumilast (DALIRESP) 500 MCG TABS tablet Take 0.5 tablets (250 mcg total) by mouth daily for 28 days, THEN 1 tablet (500 mcg total) daily. Patient not taking: Reported on 06/26/2021 04/07/21 11/01/21  Clayton Bibles, NP   Allergies  Allergen Reactions   Cephalexin Diarrhea    Other reaction(s): diarrhea   Doxycycline Diarrhea   Gabapentin Other (See Comments)    sleepiness   Morphine And Related Anxiety    Was confused when taking it   Review of Systems  Constitutional:  Positive for activity change, appetite change and fatigue.  Respiratory:  Negative for shortness of breath.   Gastrointestinal:  Negative for nausea and vomiting.  Neurological:  Positive  for weakness.  All other systems reviewed and are negative.   Physical Exam Vitals and nursing note reviewed.  Constitutional:      General: She is not in acute distress.    Appearance: She is ill-appearing.  Pulmonary:     Effort: No respiratory distress.     Comments: HFNC Skin:    General: Skin is warm and dry.  Neurological:     Mental Status: She is alert and oriented to person, place, and time.     Motor: Weakness present.  Psychiatric:        Attention and Perception: Attention normal.        Behavior: Behavior is cooperative.        Cognition and Memory: Cognition and memory normal.     Vital Signs: BP (!) 163/105 (BP Location: Left Wrist)   Pulse (!) 54   Temp 98.4 F (36.9 C) (Oral)   Resp 20   Ht $R'5\' 3"'FJ$  (1.6 m)   Wt 89.5 kg   SpO2 90%   BMI 34.95 kg/m  Pain Scale: 0-10 POSS *See Group Information*: 1-Acceptable,Awake and alert Pain Score: 8    SpO2: SpO2: 90 % O2 Device:SpO2: 90 % O2 Flow Rate: .O2 Flow Rate (L/min): 6 L/min  IO: Intake/output summary:  Intake/Output Summary (Last 24 hours) at 07/16/2021 1644 Last data filed at 07/16/2021 1347 Gross per 24 hour  Intake 590 ml  Output 3350 ml  Net -2760 ml    LBM: Last BM Date :  (PTA) Baseline Weight: Weight: 91.8 kg Most recent weight: Weight: 89.5 kg     Palliative Assessment/Data: PPS 20%     Time In: 1550 Time Out: 1715 Time Total: 85 minutes  Greater than 50%  of this time was spent counseling and coordinating care related to the above assessment and plan.  Signed by: Lin Landsman, NP   Please contact Palliative Medicine Team phone at [phone] for questions and concerns.  For individual provider: See  Amion  *Portions of this note are a verbal dictation therefore any spelling and/or grammatical errors are due to the "[place] [facility]" system interpretation.

## 2021-07-17 MED ORDER — FUROSEMIDE 10 MG/ML IJ SOLN: 40.0000 mg | Freq: Two times a day (BID) | INTRAMUSCULAR | 0 refills | Status: AC

## 2021-07-17 MED ORDER — BISOPROLOL FUMARATE 5 MG PO TABS: 2.5000 mg | ORAL_TABLET | Freq: Every day | ORAL | 0 refills | Status: AC

## 2021-07-17 MED ORDER — GLYCOPYRROLATE 1 MG PO TABS: 1.0000 mg | ORAL_TABLET | ORAL | 0 refills | Status: AC | PRN

## 2021-07-17 NOTE — TOC Transition Note (Signed)
Transition of Care [facility]) - CM/SW Discharge Note   Patient Details  Name: [name] MRN: [id number] Date of Birth: [birth year]  Transition of [facility] CM/SW Contact:  Ina Homes, [place] Phone Number: 07/17/2021, 3:36 PM   Clinical Narrative:     SW informed pt accepted to [facility], consents being completed. [facility] rep Olivia Mackie, requested 9pm transport.   PTAR called and scheduled for 9pm. Packet at nursing station  Final next level of care: [place] Barriers to Discharge: No Barriers Identified   Patient Goals and CMS Choice Patient states their goals for this hospitalization and ongoing recovery are:: Comfort care   Choice offered to / list presented to : Patient  Discharge Placement              Patient chooses bed at:  ([place])        Discharge Plan and Services                                     Social Determinants of Health (SDOH) Interventions     Readmission Risk Interventions     No data to display

## 2021-07-17 NOTE — Discharge Summary (Signed)
Physician Discharge Summary  Patient ID: [name] MRN: [id number] DOB/AGE: 76/14/1947 76 y.o.  Admit date: 07/14/2021 Discharge date: 07/17/2021  Admission Diagnoses:  Discharge Diagnoses:  Principal Problem:   Acute respiratory failure ([place]) Active Problems:   Coronary artery disease   Peripheral vascular disease ([place])   COPD with acute exacerbation (HCC)   Acute on chronic respiratory failure with hypoxia (HCC)   CAP (community acquired pneumonia)   Heart failure with reduced ejection fraction (HCC)   Lactic acidosis   Hypokalemia   Hypophosphatemia   Discharged Condition: stable  Hospital Course: Patient is a 76 year old female with past medical history significant for COPD/emphysema, chronic hypoxic respiratory failure on 2 L of oxygen, CAD s/p PCI, chronic systolic CHF last EF 10-27%,OZD, HLD, bipolar d/o, renal cell carcinoma s/p nephrectomy, recent hospitalization from 06/26/2021-06/28/2021 for acute hypoxic respiratory failure secondary to aspiration pneumonia in background of severe emphysema, and was discharged home with hospice.     Patient was readmitted to the hospital again on 07/14/2021 with increasing shortness of breath.  Reportedly, she collapsed at home.  CPR was performed and she was brought to the hospital for further management.  She declined intubation and mechanical ventilation and confirmed that she is DNR.  She was admitted to the hospital for acute hypoxic and hypercapnic respiratory failure that is multifactorial likely from pneumonia, COPD exacerbation and acute exacerbation of chronic systolic CHF.  She required BiPAP for respiratory failure.  She was treated with IV Lasix, steroids, bronchodilators and empiric IV antibiotics.  S/p cardiopulmonary arrest: -Patient was admitted with acute hypoxic and hypercapnic respiratory failure, chronic hypoxic respiratory failure on 2 L/min oxygen: She is off BiPAP.  Oxygen has been tapered down from 10 L/min to 6  L/min.  Continue to taper down oxygen as able.   -Patient will be discharged to inpatient hospice today ([facility]).   Pneumonia:  -Repeat chest x-ray on 07/16/2021 showed improved patchy interstitial and bibasilar airspace opacities which could represent edema and/or infection.   -Patient was managed with empiric IV antibiotics.   -No growth on blood cultures thus far.   -Discontinue antibiotics on discharge.   Acute exacerbation of chronic systolic CHF:  COPD exacerbation:   Hypokalemia and hypophosphatemia:  Elevated troponins in the setting of severe CAD, elevated troponins likely due to demand ischemia. Depression, bipolar disorder:  Chronic pain: Optimize pain control.    Palliative care team was consulted to assist with patient's management.  Patient has been transitioned to comfort directed care.  Currently reviewed discharge medication and adjust accordingly.    Consults:  Palliative care team   Discharge Exam: Blood pressure 135/81, pulse 94, temperature 98.2 F (36.8 C), temperature source Oral, resp. rate 14, height '5\' 3"'$  (1.6 m), weight 89.5 kg, SpO2 (!) 86 %.   Disposition: Discharge disposition: 50-Hospice/Home       Discharge Instructions     Diet - low sodium heart healthy   Complete by: As directed    Discharge wound care:   Complete by: As directed    Continue current care   Increase activity slowly   Complete by: As directed       Allergies as of 07/17/2021       Reactions   Cephalexin Diarrhea   Other reaction(s): diarrhea   Doxycycline Diarrhea   Gabapentin Other (See Comments)   sleepiness   Morphine And Related Anxiety   Was confused when taking it        Medication List  STOP taking these medications    dexlansoprazole 60 MG capsule Commonly known as: DEXILANT   furosemide 40 MG tablet Commonly known as: LASIX Replaced by: furosemide 10 MG/ML injection   levofloxacin 500 MG tablet Commonly known as: LEVAQUIN    nitroGLYCERIN 0.4 MG SL tablet Commonly known as: NITROSTAT   OXYGEN   Systane 0.4-0.3 % Gel ophthalmic gel Generic drug: Polyethyl Glycol-Propyl Glycol   tiZANidine 4 MG tablet Commonly known as: ZANAFLEX       TAKE these medications    acetaminophen 325 MG tablet Commonly known as: TYLENOL Take 2 tablets (650 mg total) by mouth every 4 (four) hours as needed for mild pain ((score 1 to 3) or temp > 100.5).   albuterol 108 (90 Base) MCG/ACT inhaler Commonly known as: VENTOLIN HFA Inhale 2 puffs into the lungs every 6 (six) hours as needed for wheezing. What changed: Another medication with the same name was changed. Make sure you understand how and when to take each.   albuterol (2.5 MG/3ML) 0.083% nebulizer solution Commonly known as: PROVENTIL INAHLE 3 ML'S ( 1 VIAL) VIA NEBULIZER EVERY 6 HOURS AS NEEDED FOR WHEEZING OR FOR SHORTNESS OF BREATH What changed: See the new instructions.   amLODipine 5 MG tablet Commonly known as: NORVASC TAKE ONE TABLET BY MOUTH DAILY   arformoterol 15 MCG/2ML Nebu Commonly known as: BROVANA Take 2 mLs (15 mcg total) by nebulization 2 (two) times daily.   ARIPiprazole 10 MG tablet Commonly known as: ABILIFY Take 1 tablet (10 mg total) by mouth daily.   aspirin EC 81 MG tablet Take 81 mg by mouth every morning.   bisoprolol 5 MG tablet Commonly known as: ZEBETA Take 0.5 tablets (2.5 mg total) by mouth daily. Start taking on: July 18, 2021   budesonide 0.5 MG/2ML nebulizer solution Commonly known as: Pulmicort Take 2 mLs (0.5 mg total) by nebulization in the morning and at bedtime.   celecoxib 200 MG capsule Commonly known as: CELEBREX Take 400 mg by mouth daily.   clopidogrel 75 MG tablet Commonly known as: PLAVIX Take 1 tablet (75 mg total) by mouth daily.   furosemide 10 MG/ML injection Commonly known as: LASIX Inject 4 mLs (40 mg total) into the vein every 12 (twelve) hours. Replaces: furosemide 40 MG tablet    glycopyrrolate 1 MG tablet Commonly known as: ROBINUL Take 1 tablet (1 mg total) by mouth every 4 (four) hours as needed (excessive secretions).   lamoTRIgine 100 MG tablet Commonly known as: LAMICTAL Take 2 tablets (200 mg total) by mouth 2 (two) times daily.   montelukast 10 MG tablet Commonly known as: SINGULAIR Take 1 tablet (10 mg total) by mouth at bedtime. TAKE 1 TABLET BY MOUTH EVERYDAY AT BEDTIME What changed: additional instructions   Oxycodone HCl 10 MG Tabs Take 1 tablet (10 mg total) by mouth every 4 (four) hours as needed for severe pain.   revefenacin 175 MCG/3ML nebulizer solution Commonly known as: YUPELRI Take 3 mLs (175 mcg total) by nebulization daily.   roflumilast 500 MCG Tabs tablet Commonly known as: DALIRESP Take 0.5 tablets (250 mcg total) by mouth daily for 28 days, THEN 1 tablet (500 mcg total) daily. Start taking on: April 07, 2021 What changed: Another medication with the same name was removed. Continue taking this medication, and follow the directions you see here.   sertraline 100 MG tablet Commonly known as: ZOLOFT Take 150 mg by mouth daily.  Discharge Care Instructions  (From admission, onward)           Start     Ordered   07/17/21 0000  Discharge wound care:       Comments: Continue current care   07/17/21 1233             Signed: Bonnell Public 07/17/2021, 12:35 PM

## 2021-07-17 NOTE — Progress Notes (Signed)
[facility])   Consent forms to be completed.  PTAR to be notified of patient D/C and transport arranged. TOC and Attending Physician  aware of plan to discharge to [facility].   Please send signed DNR form with patient and RN call report to [phone].    Daphene Calamity, MSW [facility] Liaison [phone]

## 2021-07-19 LAB — CULTURE, BLOOD (ROUTINE X 2)
Culture: NO GROWTH
Culture: NO GROWTH
Special Requests: ADEQUATE

## 2021-07-21 ENCOUNTER — Other Ambulatory Visit: Payer: Medicare Other | Admitting: Family Medicine

## 2021-07-22 ENCOUNTER — Telehealth: Payer: Self-pay | Admitting: Cardiology

## 2021-07-22 NOTE — Telephone Encounter (Signed)
Pt's friend [name] called to cancel pt's appt's and to inform Dr. [place] that pt is now at [facility] for hospice care.

## 2021-07-28 ENCOUNTER — Ambulatory Visit: Payer: Medicare Other | Admitting: Nurse Practitioner

## 2021-08-11 ENCOUNTER — Telehealth: Payer: Self-pay | Admitting: Pulmonary Disease

## 2021-08-11 NOTE — Telephone Encounter (Signed)
Yes, that would be great if we could do that. So sorry to hear that.

## 2021-08-11 NOTE — Telephone Encounter (Signed)
[place] (DPR), no answer, LVM to return call and that we received her message regarding patient.  Advised to call back if she wishes.  Dr. Valeta Harms and Joellen Jersey, Please be aware that we were notified that patient passed on 2021-08-06.

## 2021-08-11 NOTE — Telephone Encounter (Signed)
Please be aware that this patient is deceased as of 2021-08-06.

## 2021-08-17 DEATH — deceased

## 2021-08-24 NOTE — Telephone Encounter (Signed)
Card prepared for signatures.

## 2021-11-29 ENCOUNTER — Ambulatory Visit: Payer: Medicare Other | Admitting: Cardiology

## 2022-08-08 IMAGING — MG MM DIGITAL SCREENING BILAT W/ TOMO AND CAD
6 of 12 series · 6 of 36 positions shown · non-contrast
Comparison: Previous exam(s).

CLINICAL DATA: Screening.

EXAM:
DIGITAL SCREENING BILATERAL MAMMOGRAM WITH TOMOSYNTHESIS AND CAD
TECHNIQUE: Bilateral screening digital craniocaudal and mediolateral oblique
mammograms were obtained. Bilateral screening digital breast
tomosynthesis was performed. The images were evaluated with
computer-aided detection.

[R CC synth-2D]
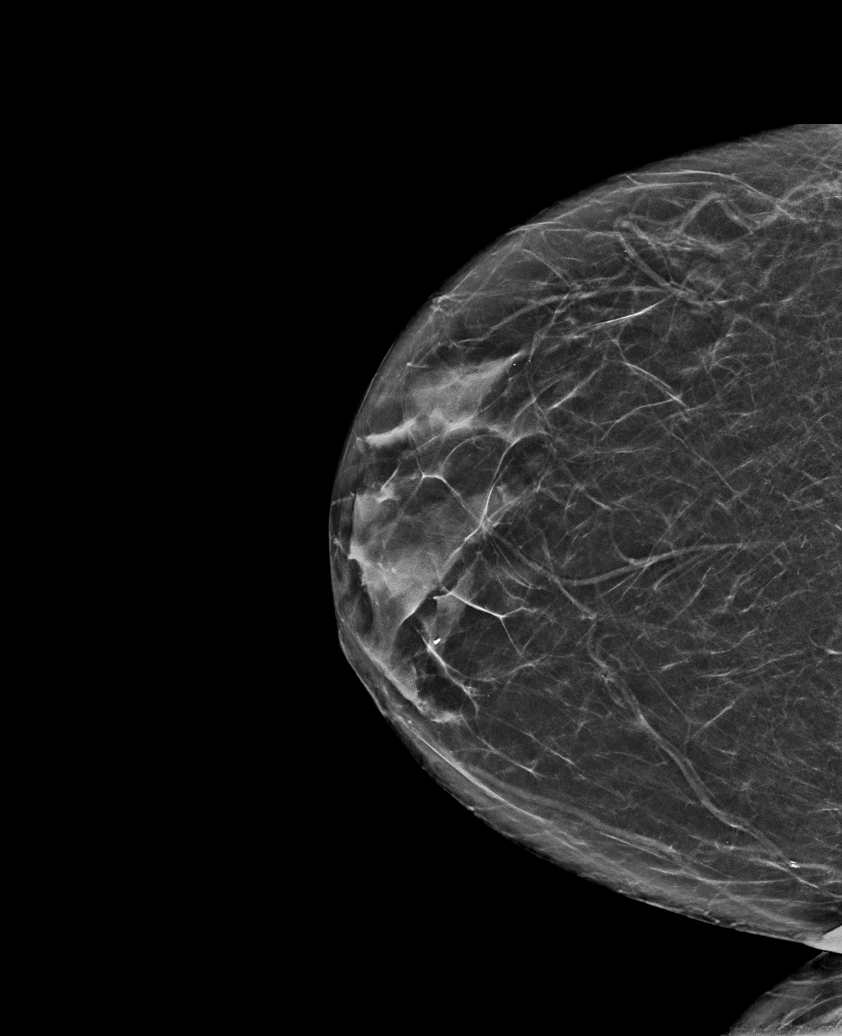

[L CC synth-2D]
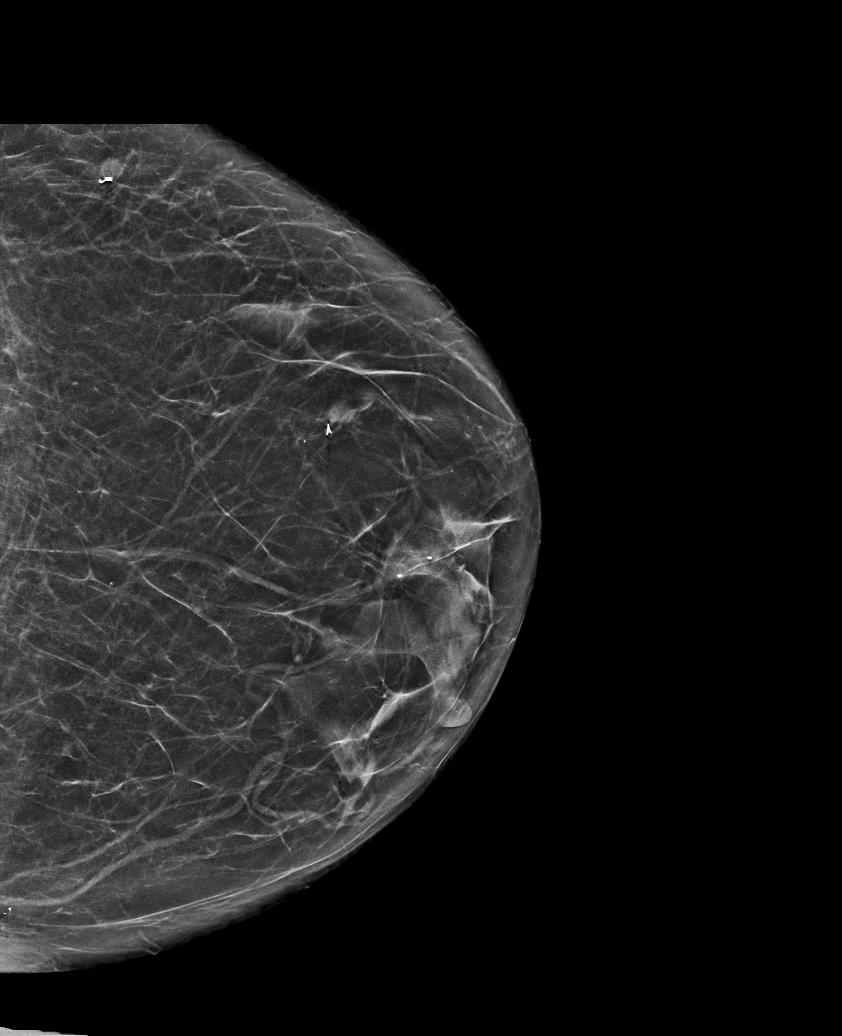

[L MLO synth-2D (1 of 2)]
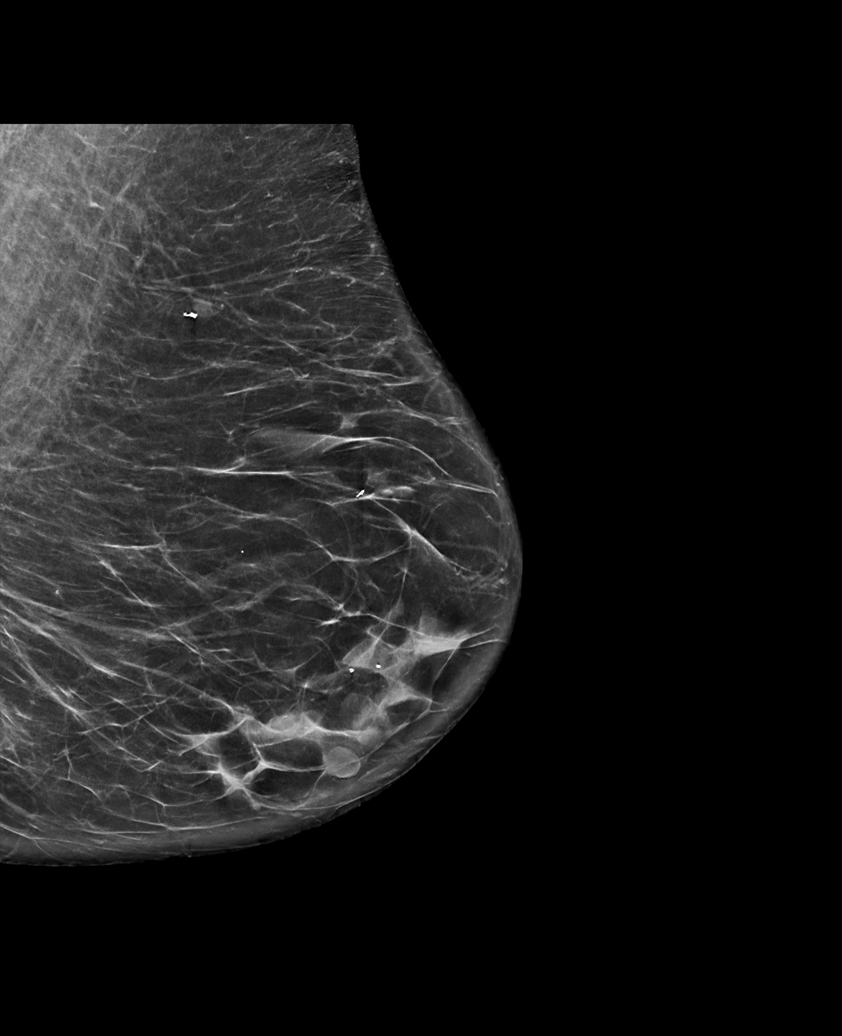

[R MLO synth-2D (1 of 2)]
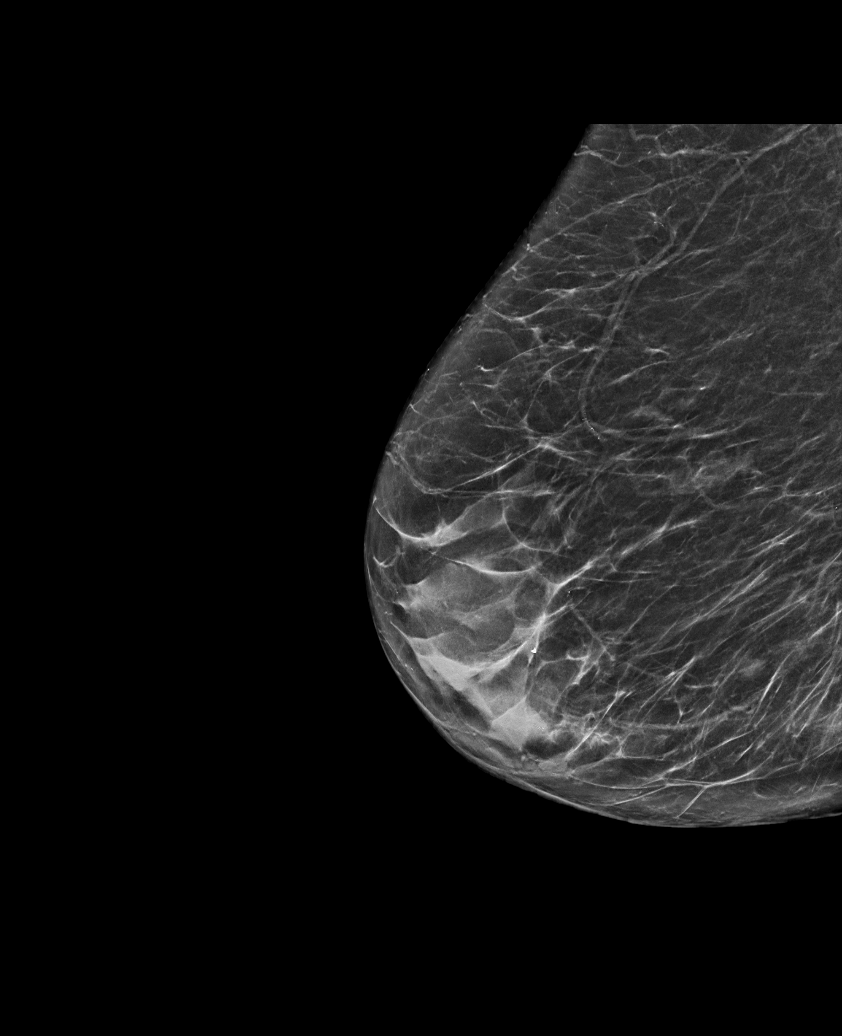

[L MLO synth-2D (2 of 2)]
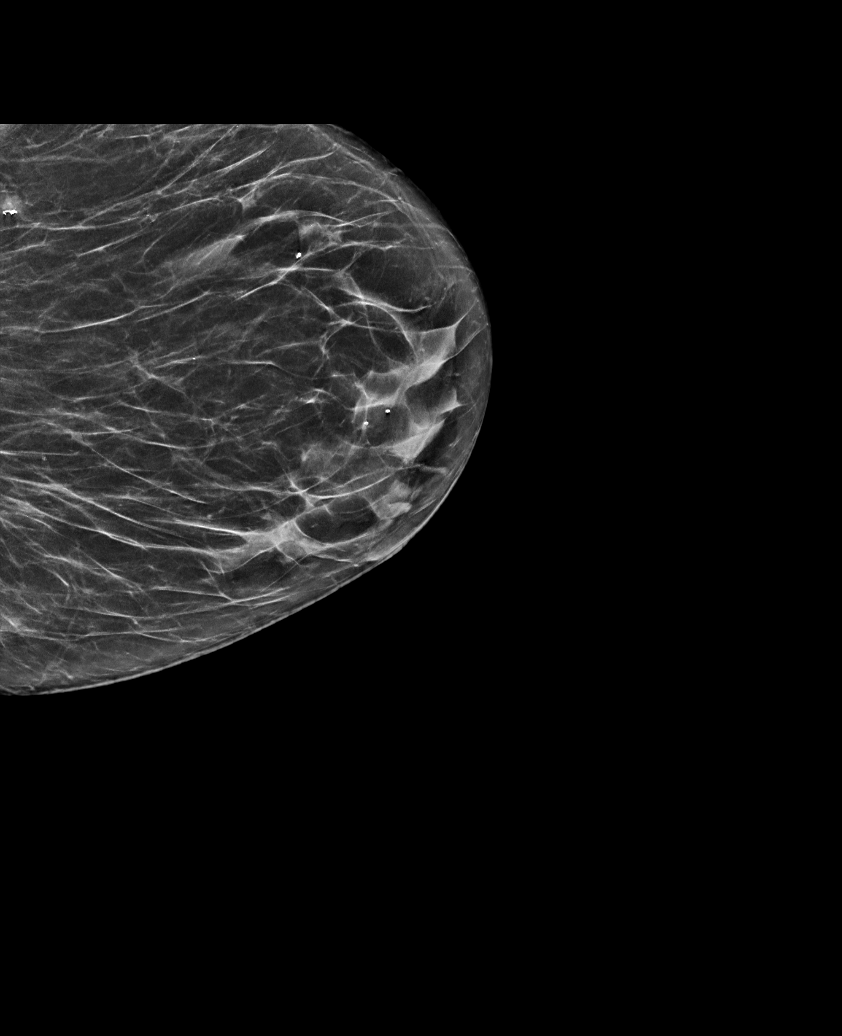

[R MLO synth-2D (2 of 2)]
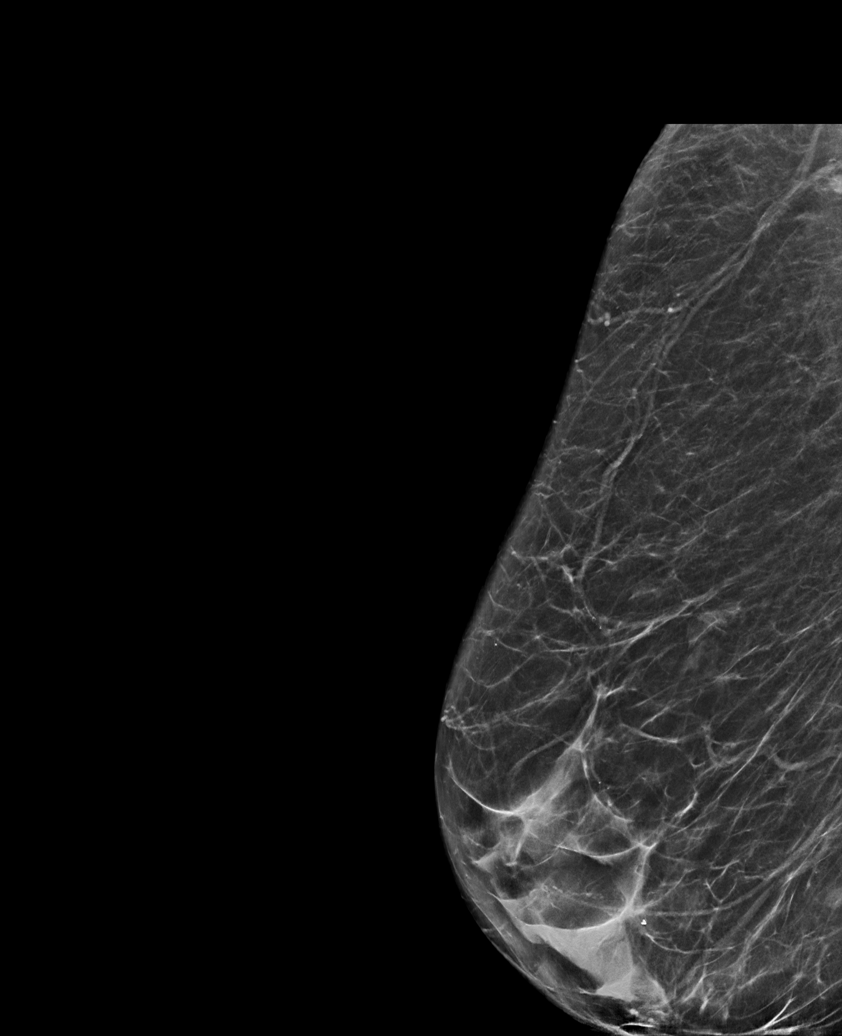

[6 of 36 positions shown; findings below may reference images not displayed]

ACR Breast Density Category b: There are scattered areas of
fibroglandular density.
FINDINGS: There are no findings suspicious for malignancy.
IMPRESSION: No mammographic evidence of malignancy. A result letter of this
screening mammogram will be mailed directly to the patient.

RECOMMENDATION:
Screening mammogram in one year. (Code:51-O-LD2)

BI-RADS CATEGORY  1: Negative.
